# Patient Record
Sex: Male | Born: 1950 | Race: White | Hispanic: No | Smoking: Never smoker
Health system: Southern US, Community
[De-identification: ages and names within clinical notes are randomized; demographics above are authoritative.]

## PROBLEM LIST (undated history)

## (undated) DIAGNOSIS — S73004A Unspecified dislocation of right hip, initial encounter: Secondary | ICD-10-CM

## (undated) DIAGNOSIS — I499 Cardiac arrhythmia, unspecified: Secondary | ICD-10-CM

## (undated) DIAGNOSIS — J45909 Unspecified asthma, uncomplicated: Secondary | ICD-10-CM

## (undated) DIAGNOSIS — G40909 Epilepsy, unspecified, not intractable, without status epilepticus: Secondary | ICD-10-CM

## (undated) DIAGNOSIS — R609 Edema, unspecified: Secondary | ICD-10-CM

## (undated) DIAGNOSIS — I2510 Atherosclerotic heart disease of native coronary artery without angina pectoris: Secondary | ICD-10-CM

## (undated) DIAGNOSIS — M109 Gout, unspecified: Secondary | ICD-10-CM

## (undated) DIAGNOSIS — M1990 Unspecified osteoarthritis, unspecified site: Secondary | ICD-10-CM

## (undated) DIAGNOSIS — M810 Age-related osteoporosis without current pathological fracture: Secondary | ICD-10-CM

## (undated) DIAGNOSIS — F319 Bipolar disorder, unspecified: Secondary | ICD-10-CM

## (undated) DIAGNOSIS — N189 Chronic kidney disease, unspecified: Secondary | ICD-10-CM

## (undated) DIAGNOSIS — F1011 Alcohol abuse, in remission: Secondary | ICD-10-CM

## (undated) DIAGNOSIS — I5189 Other ill-defined heart diseases: Secondary | ICD-10-CM

## (undated) DIAGNOSIS — J449 Chronic obstructive pulmonary disease, unspecified: Secondary | ICD-10-CM

## (undated) DIAGNOSIS — G709 Myoneural disorder, unspecified: Secondary | ICD-10-CM

## (undated) DIAGNOSIS — F419 Anxiety disorder, unspecified: Secondary | ICD-10-CM

## (undated) DIAGNOSIS — S73005A Unspecified dislocation of left hip, initial encounter: Secondary | ICD-10-CM

## (undated) DIAGNOSIS — I480 Paroxysmal atrial fibrillation: Secondary | ICD-10-CM

## (undated) DIAGNOSIS — R931 Abnormal findings on diagnostic imaging of heart and coronary circulation: Secondary | ICD-10-CM

## (undated) DIAGNOSIS — I5020 Unspecified systolic (congestive) heart failure: Secondary | ICD-10-CM

## (undated) DIAGNOSIS — N184 Chronic kidney disease, stage 4 (severe): Secondary | ICD-10-CM

## (undated) DIAGNOSIS — F32A Depression, unspecified: Secondary | ICD-10-CM

## (undated) DIAGNOSIS — I428 Other cardiomyopathies: Secondary | ICD-10-CM

## (undated) DIAGNOSIS — I5022 Chronic systolic (congestive) heart failure: Secondary | ICD-10-CM

## (undated) DIAGNOSIS — Z862 Personal history of diseases of the blood and blood-forming organs and certain disorders involving the immune mechanism: Secondary | ICD-10-CM

## (undated) DIAGNOSIS — Z87442 Personal history of urinary calculi: Secondary | ICD-10-CM

## (undated) DIAGNOSIS — I509 Heart failure, unspecified: Secondary | ICD-10-CM

## (undated) DIAGNOSIS — I69359 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: Secondary | ICD-10-CM

## (undated) DIAGNOSIS — D649 Anemia, unspecified: Secondary | ICD-10-CM

## (undated) DIAGNOSIS — F1910 Other psychoactive substance abuse, uncomplicated: Secondary | ICD-10-CM

## (undated) DIAGNOSIS — I4891 Unspecified atrial fibrillation: Secondary | ICD-10-CM

## (undated) DIAGNOSIS — I340 Nonrheumatic mitral (valve) insufficiency: Secondary | ICD-10-CM

## (undated) DIAGNOSIS — G8929 Other chronic pain: Secondary | ICD-10-CM

## (undated) DIAGNOSIS — I10 Essential (primary) hypertension: Secondary | ICD-10-CM

## (undated) DIAGNOSIS — E039 Hypothyroidism, unspecified: Secondary | ICD-10-CM

## (undated) DIAGNOSIS — G4730 Sleep apnea, unspecified: Secondary | ICD-10-CM

## (undated) HISTORY — DX: Anemia, unspecified: D64.9

## (undated) HISTORY — DX: Age-related osteoporosis without current pathological fracture: M81.0

## (undated) HISTORY — PX: TOTAL HIP ARTHROPLASTY: SHX124

## (undated) HISTORY — DX: Other cardiomyopathies: I42.8

## (undated) HISTORY — DX: Cardiac arrhythmia, unspecified: I49.9

## (undated) HISTORY — DX: Sleep apnea, unspecified: G47.30

## (undated) HISTORY — DX: Chronic obstructive pulmonary disease, unspecified: J44.9

## (undated) HISTORY — PX: CHOLECYSTECTOMY: SHX55

## (undated) HISTORY — DX: Gout, unspecified: M10.9

## (undated) HISTORY — DX: Unspecified systolic (congestive) heart failure: I50.20

## (undated) HISTORY — PX: COLOSTOMY: SHX63

## (undated) HISTORY — DX: Chronic kidney disease, unspecified: N18.9

## (undated) HISTORY — PX: COLOSTOMY TAKEDOWN: SHX5783

## (undated) HISTORY — DX: Anxiety disorder, unspecified: F41.9

## (undated) HISTORY — DX: Depression, unspecified: F32.A

## (undated) HISTORY — DX: Heart failure, unspecified: I50.9

## (undated) HISTORY — PX: HERNIA REPAIR: SHX51

## (undated) HISTORY — DX: Paroxysmal atrial fibrillation: I48.0

## (undated) HISTORY — PX: ABDOMINAL SURGERY: SHX537

---

## 1972-06-14 HISTORY — PX: OTHER SURGICAL HISTORY: SHX169

## 2003-11-07 ENCOUNTER — Other Ambulatory Visit: Payer: Self-pay

## 2005-06-08 ENCOUNTER — Ambulatory Visit: Payer: Self-pay | Admitting: Specialist

## 2006-04-08 ENCOUNTER — Emergency Department: Payer: Self-pay | Admitting: Emergency Medicine

## 2007-12-23 ENCOUNTER — Emergency Department: Payer: Self-pay | Admitting: Emergency Medicine

## 2007-12-23 ENCOUNTER — Other Ambulatory Visit: Payer: Self-pay

## 2008-01-09 ENCOUNTER — Ambulatory Visit: Payer: Self-pay | Admitting: Psychiatry

## 2008-01-09 ENCOUNTER — Emergency Department: Payer: Self-pay | Admitting: Emergency Medicine

## 2008-01-10 ENCOUNTER — Inpatient Hospital Stay (HOSPITAL_COMMUNITY): Admission: AD | Admit: 2008-01-10 | Discharge: 2008-01-13 | Payer: Self-pay | Admitting: Psychiatry

## 2008-01-23 ENCOUNTER — Ambulatory Visit: Payer: Self-pay | Admitting: Specialist

## 2008-03-08 ENCOUNTER — Emergency Department: Payer: Self-pay | Admitting: Internal Medicine

## 2008-10-29 ENCOUNTER — Ambulatory Visit: Payer: Self-pay | Admitting: Internal Medicine

## 2009-05-23 ENCOUNTER — Ambulatory Visit: Payer: Self-pay | Admitting: Orthopedic Surgery

## 2009-10-16 ENCOUNTER — Emergency Department: Payer: Self-pay | Admitting: Emergency Medicine
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
## 2011-02-18 ENCOUNTER — Observation Stay: Payer: Self-pay | Admitting: Specialist

## 2011-05-10 LAB — CARDIAC PANEL(CRET KIN+CKTOT+MB+TROPI)
CK, MB: 1.6
Relative Index: INVALID
Total CK: 85
Troponin I: 0.03

## 2011-06-22 ENCOUNTER — Ambulatory Visit: Payer: Self-pay | Admitting: Internal Medicine

## 2011-07-27 ENCOUNTER — Emergency Department: Payer: Self-pay | Admitting: *Deleted

## 2011-09-25 ENCOUNTER — Observation Stay: Payer: Self-pay | Admitting: General Practice

## 2011-09-25 LAB — CBC
HCT: 42 % (ref 40.0–52.0)
HGB: 14.1 g/dL (ref 13.0–18.0)
MCH: 33.4 pg (ref 26.0–34.0)
MCV: 100 fL (ref 80–100)
RBC: 4.2 10*6/uL — ABNORMAL LOW (ref 4.40–5.90)
WBC: 6.4 10*3/uL (ref 3.8–10.6)

## 2011-09-25 LAB — BASIC METABOLIC PANEL
Anion Gap: 9 (ref 7–16)
BUN: 9 mg/dL (ref 7–18)
Calcium, Total: 9.2 mg/dL (ref 8.5–10.1)
Chloride: 105 mmol/L (ref 98–107)
EGFR (Non-African Amer.): >60
Glucose: 95 mg/dL (ref 65–99)
Osmolality: 280 (ref 275–301)

## 2011-11-18 ENCOUNTER — Ambulatory Visit: Payer: Self-pay | Admitting: Orthopaedic Surgery

## 2011-11-18 ENCOUNTER — Emergency Department: Payer: Self-pay | Admitting: Emergency Medicine

## 2012-06-17 ENCOUNTER — Encounter: Payer: Self-pay | Admitting: Orthopedic Surgery

## 2012-07-14 ENCOUNTER — Encounter: Payer: Self-pay | Admitting: Orthopedic Surgery

## 2012-07-18 ENCOUNTER — Observation Stay: Payer: Self-pay | Admitting: Orthopedic Surgery

## 2012-07-18 LAB — BASIC METABOLIC PANEL
Calcium, Total: 8.7 mg/dL (ref 8.5–10.1)
Co2: 24 mmol/L (ref 21–32)
EGFR (Non-African Amer.): >60
Glucose: 106 mg/dL — ABNORMAL HIGH (ref 65–99)
Osmolality: 273 (ref 275–301)
Potassium: 5.3 mmol/L — ABNORMAL HIGH (ref 3.5–5.1)

## 2012-09-21 ENCOUNTER — Inpatient Hospital Stay: Payer: Self-pay | Admitting: Surgery

## 2012-09-21 LAB — CBC
HCT: 43.2 % (ref 40.0–52.0)
MCV: 96 fL (ref 80–100)
Platelet: 240 10*3/uL (ref 150–440)
RBC: 4.49 10*6/uL (ref 4.40–5.90)
RDW: 13.1 % (ref 11.5–14.5)

## 2012-09-21 LAB — COMPREHENSIVE METABOLIC PANEL
Albumin: 3.2 g/dL — ABNORMAL LOW (ref 3.4–5.0)
Alkaline Phosphatase: 144 U/L — ABNORMAL HIGH (ref 50–136)
Bilirubin,Total: 1.1 mg/dL — ABNORMAL HIGH (ref 0.2–1.0)
Calcium, Total: 9.5 mg/dL (ref 8.5–10.1)
Creatinine: 1.01 mg/dL (ref 0.60–1.30)
EGFR (African American): >60
Glucose: 169 mg/dL — ABNORMAL HIGH (ref 65–99)
Osmolality: 284 (ref 275–301)
Potassium: 3.7 mmol/L (ref 3.5–5.1)
SGOT(AST): 273 U/L — ABNORMAL HIGH (ref 15–37)
SGPT (ALT): 92 U/L — ABNORMAL HIGH (ref 12–78)
Sodium: 140 mmol/L (ref 136–145)

## 2012-09-21 LAB — APTT: Activated PTT: 31.2 secs (ref 23.6–35.9)

## 2012-09-21 LAB — PROTIME-INR: Prothrombin Time: 13.2 secs (ref 11.5–14.7)

## 2012-09-22 DIAGNOSIS — R079 Chest pain, unspecified: Secondary | ICD-10-CM

## 2012-09-22 LAB — TROPONIN I: Troponin-I: <0.02 ng/mL

## 2012-09-22 LAB — HEPATIC FUNCTION PANEL A (ARMC)
Albumin: 2.8 g/dL — ABNORMAL LOW (ref 3.4–5.0)
Alkaline Phosphatase: 151 U/L — ABNORMAL HIGH (ref 50–136)
Bilirubin,Total: 1 mg/dL (ref 0.2–1.0)
SGOT(AST): 340 U/L — ABNORMAL HIGH (ref 15–37)
Total Protein: 6.3 g/dL — ABNORMAL LOW (ref 6.4–8.2)

## 2012-09-22 LAB — CBC WITH DIFFERENTIAL/PLATELET
Eosinophil #: 0.2 10*3/uL (ref 0.0–0.7)
HGB: 13.2 g/dL (ref 13.0–18.0)
Lymphocyte #: 2.4 10*3/uL (ref 1.0–3.6)
MCH: 31.9 pg (ref 26.0–34.0)
MCHC: 32.9 g/dL (ref 32.0–36.0)
Monocyte %: 10.7 %
Neutrophil #: 1.9 10*3/uL (ref 1.4–6.5)
Neutrophil %: 37 %
Platelet: 210 10*3/uL (ref 150–440)
WBC: 5 10*3/uL (ref 3.8–10.6)

## 2012-09-22 LAB — BASIC METABOLIC PANEL
Calcium, Total: 8.9 mg/dL (ref 8.5–10.1)
Chloride: 106 mmol/L (ref 98–107)
Creatinine: 0.88 mg/dL (ref 0.60–1.30)
EGFR (Non-African Amer.): >60

## 2012-09-22 LAB — HEMOGLOBIN A1C: Hemoglobin A1C: 6 % (ref 4.2–6.3)

## 2012-09-22 LAB — LIPID PANEL
Cholesterol: 108 mg/dL (ref 0–200)
VLDL Cholesterol, Calc: 11 mg/dL (ref 5–40)

## 2012-09-23 DIAGNOSIS — R079 Chest pain, unspecified: Secondary | ICD-10-CM

## 2012-09-23 LAB — HEPATIC FUNCTION PANEL A (ARMC)
Albumin: 3 g/dL — ABNORMAL LOW (ref 3.4–5.0)
Alkaline Phosphatase: 202 U/L — ABNORMAL HIGH (ref 50–136)
Bilirubin, Direct: 0.2 mg/dL (ref 0.00–0.20)
SGPT (ALT): 383 U/L — ABNORMAL HIGH (ref 12–78)

## 2012-09-24 LAB — HEPATIC FUNCTION PANEL A (ARMC)
Albumin: 2.9 g/dL — ABNORMAL LOW (ref 3.4–5.0)
SGOT(AST): 128 U/L — ABNORMAL HIGH (ref 15–37)
Total Protein: 6.6 g/dL (ref 6.4–8.2)

## 2012-09-25 LAB — BASIC METABOLIC PANEL
Creatinine: 1.18 mg/dL (ref 0.60–1.30)
EGFR (Non-African Amer.): >60
Glucose: 143 mg/dL — ABNORMAL HIGH (ref 65–99)

## 2012-09-25 LAB — CBC WITH DIFFERENTIAL/PLATELET
Eosinophil #: 0 10*3/uL (ref 0.0–0.7)
Eosinophil %: 0.1 %
HCT: 34.7 % — ABNORMAL LOW (ref 40.0–52.0)
HGB: 11.8 g/dL — ABNORMAL LOW (ref 13.0–18.0)
Monocyte %: 9.1 %
Platelet: 327 10*3/uL (ref 150–440)
RBC: 3.61 10*6/uL — ABNORMAL LOW (ref 4.40–5.90)
WBC: 11 10*3/uL — ABNORMAL HIGH (ref 3.8–10.6)

## 2012-09-25 LAB — HEPATIC FUNCTION PANEL A (ARMC)
Albumin: 2.7 g/dL — ABNORMAL LOW (ref 3.4–5.0)
Bilirubin,Total: 1 mg/dL (ref 0.2–1.0)
SGPT (ALT): 158 U/L — ABNORMAL HIGH (ref 12–78)
Total Protein: 6.1 g/dL — ABNORMAL LOW (ref 6.4–8.2)

## 2012-09-25 LAB — HEMOGLOBIN: HGB: 10.7 g/dL — ABNORMAL LOW (ref 13.0–18.0)

## 2012-09-26 LAB — PROTIME-INR
INR: 1
Prothrombin Time: 14 secs (ref 11.5–14.7)

## 2012-09-26 LAB — APTT: Activated PTT: 29.8 secs (ref 23.6–35.9)

## 2012-09-26 LAB — HEMOGLOBIN
HGB: 8.3 g/dL — ABNORMAL LOW (ref 13.0–18.0)
HGB: 9.5 g/dL — ABNORMAL LOW (ref 13.0–18.0)

## 2012-10-02 ENCOUNTER — Other Ambulatory Visit: Payer: Self-pay | Admitting: Surgery

## 2012-10-02 LAB — CBC WITH DIFFERENTIAL/PLATELET
Basophil #: 0.1 x10 3/mm 3
Basophil %: 0.8 %
Eosinophil #: 0.6 x10 3/mm 3
Eosinophil %: 5.8 %
HCT: 29 % — ABNORMAL LOW
HGB: 9.8 g/dL — ABNORMAL LOW
Lymphocyte %: 26.2 %
Lymphs Abs: 2.5 x10 3/mm 3
MCH: 33.1 pg
MCHC: 33.6 g/dL
MCV: 99 fL
Monocyte #: 1.2 "x10 3/mm " — ABNORMAL HIGH
Monocyte %: 12.2 %
Neutrophil #: 5.3 x10 3/mm 3
Neutrophil %: 55 %
Platelet: 451 x10 3/mm 3 — ABNORMAL HIGH
RBC: 2.95 x10 6/mm 3 — ABNORMAL LOW
RDW: 14 %
WBC: 9.6 x10 3/mm 3

## 2012-12-23 ENCOUNTER — Emergency Department: Payer: Self-pay | Admitting: Emergency Medicine

## 2013-05-19 ENCOUNTER — Emergency Department: Payer: Self-pay | Admitting: Emergency Medicine

## 2013-05-19 ENCOUNTER — Ambulatory Visit: Payer: Self-pay | Admitting: Orthopedic Surgery

## 2013-05-19 LAB — COMPREHENSIVE METABOLIC PANEL
Albumin: 2.8 g/dL — ABNORMAL LOW (ref 3.4–5.0)
Anion Gap: 3 — ABNORMAL LOW (ref 7–16)
BUN: 26 mg/dL — ABNORMAL HIGH (ref 7–18)
Bilirubin,Total: 0.6 mg/dL (ref 0.2–1.0)
Calcium, Total: 8.7 mg/dL (ref 8.5–10.1)
Co2: 29 mmol/L (ref 21–32)
Creatinine: 1.3 mg/dL (ref 0.60–1.30)
EGFR (African American): >60
Glucose: 116 mg/dL — ABNORMAL HIGH (ref 65–99)
SGOT(AST): 21 U/L (ref 15–37)
SGPT (ALT): 14 U/L (ref 12–78)
Total Protein: 5.8 g/dL — ABNORMAL LOW (ref 6.4–8.2)

## 2013-05-19 LAB — CBC
HCT: 34.4 % — ABNORMAL LOW (ref 40.0–52.0)
HGB: 11.9 g/dL — ABNORMAL LOW (ref 13.0–18.0)
MCHC: 34.6 g/dL (ref 32.0–36.0)
RDW: 13.9 % (ref 11.5–14.5)
WBC: 6.4 10*3/uL (ref 3.8–10.6)

## 2013-05-19 LAB — PROTIME-INR: Prothrombin Time: 12.9 secs (ref 11.5–14.7)

## 2013-07-08 ENCOUNTER — Other Ambulatory Visit: Payer: Self-pay | Admitting: Physician Assistant

## 2013-07-08 LAB — CBC WITH DIFFERENTIAL/PLATELET
Eosinophil #: 0.1 10*3/uL (ref 0.0–0.7)
Eosinophil %: 2 %
HGB: 14.9 g/dL (ref 13.0–18.0)
Lymphocyte #: 2.6 10*3/uL (ref 1.0–3.6)
MCHC: 33.5 g/dL (ref 32.0–36.0)
MCV: 98 fL (ref 80–100)
Monocyte #: 0.5 x10 3/mm (ref 0.2–1.0)
WBC: 6.9 10*3/uL (ref 3.8–10.6)

## 2013-09-15 ENCOUNTER — Ambulatory Visit: Payer: Self-pay | Admitting: Student

## 2013-09-15 ENCOUNTER — Ambulatory Visit: Payer: Self-pay | Admitting: Internal Medicine

## 2013-09-15 LAB — BASIC METABOLIC PANEL
Anion Gap: 3 — ABNORMAL LOW (ref 7–16)
BUN: 17 mg/dL (ref 7–18)
CALCIUM: 9.5 mg/dL (ref 8.5–10.1)
CHLORIDE: 105 mmol/L (ref 98–107)
CO2: 27 mmol/L (ref 21–32)
Creatinine: 1.19 mg/dL (ref 0.60–1.30)
EGFR (African American): >60
EGFR (Non-African Amer.): >60
GLUCOSE: 108 mg/dL — AB (ref 65–99)
OSMOLALITY: 272 (ref 275–301)
POTASSIUM: 4.7 mmol/L (ref 3.5–5.1)
Sodium: 135 mmol/L — ABNORMAL LOW (ref 136–145)

## 2013-09-15 LAB — CBC
HCT: 40.7 % (ref 40.0–52.0)
HGB: 13.7 g/dL (ref 13.0–18.0)
MCH: 32.8 pg (ref 26.0–34.0)
MCHC: 33.6 g/dL (ref 32.0–36.0)
MCV: 98 fL (ref 80–100)
PLATELETS: 209 10*3/uL (ref 150–440)
RBC: 4.17 10*6/uL — ABNORMAL LOW (ref 4.40–5.90)
RDW: 14 % (ref 11.5–14.5)
WBC: 6.5 10*3/uL (ref 3.8–10.6)

## 2013-10-29 ENCOUNTER — Ambulatory Visit: Payer: Self-pay | Admitting: Orthopedic Surgery

## 2013-12-01 ENCOUNTER — Emergency Department: Payer: Self-pay | Admitting: Emergency Medicine

## 2014-03-07 ENCOUNTER — Ambulatory Visit: Payer: Self-pay | Admitting: Orthopedic Surgery

## 2014-03-07 ENCOUNTER — Emergency Department: Payer: Self-pay | Admitting: Emergency Medicine

## 2014-03-07 LAB — CBC
HCT: 39.5 % — ABNORMAL LOW (ref 40.0–52.0)
HGB: 13 g/dL (ref 13.0–18.0)
MCH: 33.2 pg (ref 26.0–34.0)
MCHC: 32.9 g/dL (ref 32.0–36.0)
MCV: 101 fL — AB (ref 80–100)
PLATELETS: 214 10*3/uL (ref 150–440)
RBC: 3.92 10*6/uL — ABNORMAL LOW (ref 4.40–5.90)
RDW: 14 % (ref 11.5–14.5)
WBC: 7.2 10*3/uL (ref 3.8–10.6)

## 2014-03-07 LAB — BASIC METABOLIC PANEL
Anion Gap: 6 — ABNORMAL LOW (ref 7–16)
BUN: 16 mg/dL (ref 7–18)
CO2: 26 mmol/L (ref 21–32)
Calcium, Total: 9.4 mg/dL (ref 8.5–10.1)
Chloride: 106 mmol/L (ref 98–107)
Creatinine: 1.15 mg/dL (ref 0.60–1.30)
EGFR (Non-African Amer.): >60
Glucose: 99 mg/dL (ref 65–99)
OSMOLALITY: 277 (ref 275–301)
Potassium: 4 mmol/L (ref 3.5–5.1)
SODIUM: 138 mmol/L (ref 136–145)

## 2014-05-06 ENCOUNTER — Emergency Department: Payer: Self-pay | Admitting: Emergency Medicine

## 2014-06-25 ENCOUNTER — Observation Stay: Payer: Self-pay | Admitting: Orthopedic Surgery

## 2014-06-26 LAB — CBC WITH DIFFERENTIAL/PLATELET
BASOS PCT: 0.4 %
Basophil #: 0 10*3/uL (ref 0.0–0.1)
Eosinophil #: 0.2 10*3/uL (ref 0.0–0.7)
Eosinophil %: 2.7 %
HCT: 37.2 % — AB (ref 40.0–52.0)
HGB: 12.4 g/dL — AB (ref 13.0–18.0)
LYMPHS ABS: 2.1 10*3/uL (ref 1.0–3.6)
LYMPHS PCT: 33.9 %
MCH: 34.1 pg — AB (ref 26.0–34.0)
MCHC: 33.3 g/dL (ref 32.0–36.0)
MCV: 102 fL — AB (ref 80–100)
MONO ABS: 0.7 x10 3/mm (ref 0.2–1.0)
MONOS PCT: 11.7 %
NEUTROS PCT: 51.3 %
Neutrophil #: 3.1 10*3/uL (ref 1.4–6.5)
Platelet: 190 10*3/uL (ref 150–440)
RBC: 3.64 10*6/uL — AB (ref 4.40–5.90)
RDW: 13.6 % (ref 11.5–14.5)
WBC: 6.1 10*3/uL (ref 3.8–10.6)

## 2014-06-26 LAB — BASIC METABOLIC PANEL
Anion Gap: 3 — ABNORMAL LOW (ref 7–16)
Anion Gap: 5 — ABNORMAL LOW (ref 7–16)
BUN: 11 mg/dL (ref 7–18)
BUN: 13 mg/dL (ref 7–18)
CHLORIDE: 107 mmol/L (ref 98–107)
CO2: 29 mmol/L (ref 21–32)
CREATININE: 1.06 mg/dL (ref 0.60–1.30)
Calcium, Total: 8.8 mg/dL (ref 8.5–10.1)
Calcium, Total: 8.8 mg/dL (ref 8.5–10.1)
Chloride: 106 mmol/L (ref 98–107)
Co2: 31 mmol/L (ref 21–32)
Creatinine: 1.2 mg/dL (ref 0.60–1.30)
EGFR (African American): >60
EGFR (African American): >60
EGFR (Non-African Amer.): >60
EGFR (Non-African Amer.): >60
GLUCOSE: 109 mg/dL — AB (ref 65–99)
Glucose: 100 mg/dL — ABNORMAL HIGH (ref 65–99)
OSMOLALITY: 278 (ref 275–301)
Osmolality: 283 (ref 275–301)
Potassium: 3.5 mmol/L (ref 3.5–5.1)
Potassium: 3.7 mmol/L (ref 3.5–5.1)
Sodium: 139 mmol/L (ref 136–145)
Sodium: 142 mmol/L (ref 136–145)

## 2014-07-28 ENCOUNTER — Emergency Department: Payer: Self-pay | Admitting: Specialist

## 2014-07-28 LAB — BASIC METABOLIC PANEL
Anion Gap: 4 — ABNORMAL LOW (ref 7–16)
BUN: 19 mg/dL — ABNORMAL HIGH (ref 7–18)
CO2: 26 mmol/L (ref 21–32)
CREATININE: 1.1 mg/dL (ref 0.60–1.30)
Calcium, Total: 9.2 mg/dL (ref 8.5–10.1)
Chloride: 105 mmol/L (ref 98–107)
EGFR (African American): >60
EGFR (Non-African Amer.): >60
Glucose: 92 mg/dL (ref 65–99)
OSMOLALITY: 272 (ref 275–301)
Potassium: 4.5 mmol/L (ref 3.5–5.1)
Sodium: 135 mmol/L — ABNORMAL LOW (ref 136–145)

## 2014-07-28 LAB — CBC WITH DIFFERENTIAL/PLATELET
BASOS ABS: 0.1 10*3/uL (ref 0.0–0.1)
Basophil %: 0.9 %
EOS ABS: 0.2 10*3/uL (ref 0.0–0.7)
EOS PCT: 3.2 %
HCT: 39 % — AB (ref 40.0–52.0)
HGB: 12.8 g/dL — ABNORMAL LOW (ref 13.0–18.0)
LYMPHS PCT: 35.2 %
Lymphocyte #: 1.9 10*3/uL (ref 1.0–3.6)
MCH: 32.6 pg (ref 26.0–34.0)
MCHC: 32.7 g/dL (ref 32.0–36.0)
MCV: 100 fL (ref 80–100)
Monocyte #: 0.5 x10 3/mm (ref 0.2–1.0)
Monocyte %: 8.9 %
NEUTROS PCT: 51.8 %
Neutrophil #: 2.8 10*3/uL (ref 1.4–6.5)
Platelet: 214 10*3/uL (ref 150–440)
RBC: 3.92 10*6/uL — AB (ref 4.40–5.90)
RDW: 13.5 % (ref 11.5–14.5)
WBC: 5.4 10*3/uL (ref 3.8–10.6)

## 2014-08-28 ENCOUNTER — Ambulatory Visit: Payer: Self-pay | Admitting: Orthopedic Surgery

## 2014-08-28 LAB — CBC WITH DIFFERENTIAL/PLATELET
BASOS PCT: 1.1 %
Basophil #: 0.1 10*3/uL (ref 0.0–0.1)
EOS ABS: 0.2 10*3/uL (ref 0.0–0.7)
EOS PCT: 3 %
HCT: 38.6 % — AB (ref 40.0–52.0)
HGB: 12.7 g/dL — ABNORMAL LOW (ref 13.0–18.0)
Lymphocyte #: 2.4 10*3/uL (ref 1.0–3.6)
Lymphocyte %: 34.8 %
MCH: 32.9 pg (ref 26.0–34.0)
MCHC: 33 g/dL (ref 32.0–36.0)
MCV: 100 fL (ref 80–100)
Monocyte #: 0.6 x10 3/mm (ref 0.2–1.0)
Monocyte %: 8.8 %
NEUTROS ABS: 3.6 10*3/uL (ref 1.4–6.5)
NEUTROS PCT: 52.3 %
PLATELETS: 274 10*3/uL (ref 150–440)
RBC: 3.88 10*6/uL — AB (ref 4.40–5.90)
RDW: 14.5 % (ref 11.5–14.5)
WBC: 6.8 10*3/uL (ref 3.8–10.6)

## 2014-08-28 LAB — BASIC METABOLIC PANEL
Anion Gap: 6 — ABNORMAL LOW (ref 7–16)
BUN: 15 mg/dL (ref 7–18)
CO2: 27 mmol/L (ref 21–32)
CREATININE: 1.21 mg/dL (ref 0.60–1.30)
Calcium, Total: 8.9 mg/dL (ref 8.5–10.1)
Chloride: 106 mmol/L (ref 98–107)
EGFR (African American): >60
Glucose: 84 mg/dL (ref 65–99)
OSMOLALITY: 278 (ref 275–301)
Potassium: 4.2 mmol/L (ref 3.5–5.1)
SODIUM: 139 mmol/L (ref 136–145)

## 2014-08-28 LAB — PROTIME-INR
INR: 0.9
Prothrombin Time: 12.5 secs (ref 11.5–14.7)

## 2014-08-28 LAB — APTT: Activated PTT: 35.2 secs (ref 23.6–35.9)

## 2014-09-18 ENCOUNTER — Observation Stay: Payer: Self-pay | Admitting: Orthopedic Surgery

## 2014-09-18 LAB — BASIC METABOLIC PANEL
ANION GAP: 7 (ref 7–16)
BUN: 16 mg/dL (ref 7–18)
CHLORIDE: 107 mmol/L (ref 98–107)
CREATININE: 1.14 mg/dL (ref 0.60–1.30)
Calcium, Total: 9.5 mg/dL (ref 8.5–10.1)
Co2: 25 mmol/L (ref 21–32)
Glucose: 93 mg/dL (ref 65–99)
Osmolality: 278 (ref 275–301)
Potassium: 4.3 mmol/L (ref 3.5–5.1)
Sodium: 139 mmol/L (ref 136–145)

## 2014-09-18 LAB — CBC WITH DIFFERENTIAL/PLATELET
BASOS ABS: 0.1 10*3/uL (ref 0.0–0.1)
Basophil %: 0.8 %
EOS PCT: 3.3 %
Eosinophil #: 0.2 10*3/uL (ref 0.0–0.7)
HCT: 42.2 % (ref 40.0–52.0)
HGB: 13.9 g/dL (ref 13.0–18.0)
LYMPHS ABS: 2.4 10*3/uL (ref 1.0–3.6)
Lymphocyte %: 38.6 %
MCH: 32.7 pg (ref 26.0–34.0)
MCHC: 32.9 g/dL (ref 32.0–36.0)
MCV: 99 fL (ref 80–100)
MONO ABS: 0.6 x10 3/mm (ref 0.2–1.0)
MONOS PCT: 9.3 %
Neutrophil #: 3 10*3/uL (ref 1.4–6.5)
Neutrophil %: 48 %
Platelet: 214 10*3/uL (ref 150–440)
RBC: 4.25 10*6/uL — AB (ref 4.40–5.90)
RDW: 14.3 % (ref 11.5–14.5)
WBC: 6.2 10*3/uL (ref 3.8–10.6)

## 2014-09-18 LAB — PROTIME-INR
INR: 1
PROTHROMBIN TIME: 13.2 s

## 2014-09-18 LAB — APTT: ACTIVATED PTT: 38.3 s — AB (ref 23.6–35.9)

## 2014-09-21 ENCOUNTER — Inpatient Hospital Stay: Payer: Self-pay | Admitting: Orthopedic Surgery
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
## 2014-12-01 NOTE — Op Note (Signed)
PATIENT NAME:  Matthew [name], REBELO MR#:  [id number] DATE OF BIRTH:  04-Apr-1951  DATE OF PROCEDURE:  07/18/2012  PREOPERATIVE DIAGNOSIS: Left dislocated total hip.   POSTOPERATIVE DIAGNOSIS: Left dislocated total hip.   PROCEDURE: Closed reduction of left total hip.   SURGEON: Hessie Knows, MD  ANESTHESIA: General.   HISTORY OF PRESENT ILLNESS: The patient is a 64 year old who has had multiple prior dislocations of his total hip requiring anesthesia to get it reduced. He had closed reduction attempted in the Emergency Room which was unsuccessful. He was brought to the Operating Room for closed reduction today.   DESCRIPTION OF PROCEDURE: After appropriate patient identification and time out procedures were completed, the C-arm was brought in and the head visualized. In extreme flexion, the hip came down from a superior aspect. With longitudinal traction and external rotation, it appeared to be centered. With gentle internal rotation and extension, it reduced. The leg appeared to have a concentric reduction. With the leg in extension, with traction, there was some pistoning of the hip apparent. There was no blood loss and no complications and no specimen. The patient was sent to the Recovery Room in stable condition with a knee immobilizer to prevent knee flexion and hopefully recurrent dislocation.  ____________________________ Laurene Footman, MD mjm:slb D: 07/18/2012 21:34:25 ET T: 07/19/2012 07:56:07 ET JOB#: [id number]  cc: Laurene Footman, MD, <Dictator> Dr. Gerrit Heck ([place]) [facility] MD ELECTRONICALLY SIGNED 07/19/2012 8:26
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
## 2014-12-04 NOTE — Op Note (Signed)
PATIENT NAME:  Matthew [name], Matthew [name] MR#:  [id number] DATE OF BIRTH:  [birth year]  DATE OF PROCEDURE:  09/24/2012  PREOPERATIVE DIAGNOSIS:  Acute cholecystitis.   POSTOPERATIVE DIAGNOSIS:  Acute cholecystitis.  PROCEDURE:  Laparoscopic cholecystectomy.   SURGEON:  Rodena Goldmann, M.D.   ASSISTANT:  [place], [place] student.   ANESTHESIA:  General.   OPERATIVE PROCEDURE:  With the patient in the supine position, after the induction of appropriate general anesthesia, the patient's abdomen was prepped with ChloraPrep and draped with sterile towels. Because of his previous multiple surgeries, we attempted a right lower quadrant entrance by using the Visiport apparatus under direct vision.  The abdominal cavity was cannulated without difficulty, and the abdomen insufflated. The adhesions were identified. The right upper quadrant midline superiorly were free of major adhesions.  Ports were placed in the standard fashion with the exception of the fact that the umbilical port was placed to the right of the umbilicus and several centimeters above. All ports were placed under direct vision, and no bowel injury was identified. The gallbladder was retracted superiorly and laterally exposing the hepatoduodenal ligament. The cystic artery and cystic duct were identified. The cystic duct was doubly clipped on both sides and divided. The cystic artery was doubly clipped and divided. The gallbladder was then dissected free from its bed and delivered using hook and cautery apparatus. Once the gallbladder was free, it was captured and removed through the midepigastric incision. The incision had to be enlarged to accommodate the very large stone. The area was then copiously suctioned and irrigated. JP drain was placed because of the multiple adhesions and inflammatory change around the gallbladder. It was brought out through one of the separate stab wounds. The upper midline incision was closed with 0 Vicryl in 2 figure-of-eight sutures  using the suture passer. The area was infiltrated with 0.25% Marcaine for postoperative pain control.   Sterile dressings were applied. The patient was returned to the recovery room having tolerated the procedure well. Sponge, instrument and needle counts were correct x 2 in the operating room.     ____________________________ Rodena Goldmann III, MD rle:dm D: 09/24/2012 16:28:50 ET T: 09/24/2012 22:35:48 ET JOB#: [id number]  cc: Rodena Goldmann III, MD, <Dictator> Rodena Goldmann MD ELECTRONICALLY SIGNED 09/25/2012 18:06

## 2014-12-04 NOTE — Consult Note (Signed)
General Aspect 64 yo patient admitted with prolonged chest pain   Present Illness No documented cardiac history.  Indicates normal stress test 2 years ago.  Had just gone to get a Pepsi and was in bed  watching Olympics had 10/10 SSCP  Hard to breath not positional.  Lasted 45 minutes  then returned latter at which time he went to ER.  History of ETOH abuse and LFTls elevated.  Denies history of esophageal diseae or ulcers.  Has had multiple hip surgeries and needs another one 10/03/12 Currently pain free   Physical Exam:  GEN Desheveled talkative male   NECK supple   RESP normal resp effort  clear BS   CARD Regular rate and rhythm  No murmur   ABD denies tenderness  soft   EXTR negative edema   SKIN No rashes   NEURO Nonfocal   Review of Systems:  Subjective/Chief Complaint Chest pain   Medications/Allergies Reviewed Medications/Allergies reviewed   Home Medications: Medication Instructions Status  Cymbalta 1 cap(s) orally 2 times a day Active  lisinopril 10 mg oral tablet 1 tab(s) orally once a day Active  fluticasone 50 mcg/inh nasal spray 2 spray(s) nasal 2 times a day Active  Serevent Diskus 50 mcg inhalation powder 1 puff(s) inhaled 2 times a day Active  diazepam 5 mg oral tablet 1 tab(s) orally once a day (at bedtime) Active  cyclobenzaprine 5 mg oral tablet 1 tab(s) orally 1 hour before bedtime Active   Lab Results: Routine Chem:  09-Feb-14 02:27   Hemoglobin A1c ([facility]) 6.0 (The American Diabetes Association recommends that a primary goal of therapy should be <7% and that physicians should reevaluate the treatment regimen in patients with HbA1c values consistently >8%.)  Cholesterol, Serum 108  Triglycerides, Serum 54  HDL (INHOUSE) 43  VLDL Cholesterol Calculated 11  LDL Cholesterol Calculated 54 (Result(s) reported on 22 Sep 2012 at 03:03AM.)  Glucose, Serum  124  BUN 14  Creatinine (comp) 0.88  Sodium, Serum 139  Potassium, Serum 3.9  Chloride, Serum  106  CO2, Serum 27  Calcium (Total), Serum 8.9  Anion Gap  6  Osmolality (calc) 279  eGFR (African American) >60  eGFR (Non-African American) >60 (eGFR values <73m/min/1.73 m2 may be an indication of chronic kidney disease (CKD). Calculated eGFR is useful in patients with stable renal function. The eGFR calculation will not be reliable in acutely ill patients when serum creatinine is changing rapidly. It is not useful in  patients on dialysis. The eGFR calculation may not be applicable to patients at the low and high extremes of body sizes, pregnant women, and vegetarians.)  Cardiac:  09-Feb-14 02:27   Troponin I < 0.02 (0.00-0.05 0.05 ng/mL or less: NEGATIVE  Repeat testing in 3-6 hrs  if clinically indicated. >0.05 ng/mL: POTENTIAL  MYOCARDIAL INJURY. Repeat  testing in 3-6 hrs if  clinically indicated. NOTE: An increase or decrease  of 30% or more on serial  testing suggests a  clinically important change)  CPK-MB, Serum  < 0.5 (Result(s) reported on 22 Sep 2012 at 03:03AM.)  Routine Hem:  09-Feb-14 02:27   WBC (CBC) 5.0  RBC (CBC)  4.14  Hemoglobin (CBC) 13.2  Hematocrit (CBC) 40.2  Platelet Count (CBC) 210  MCV 97  MCH 31.9  MCHC 32.9  RDW 13.3  Neutrophil % 37.0  Lymphocyte % 48.1  Monocyte % 10.7  Eosinophil % 3.7  Basophil % 0.5  Neutrophil # 1.9  Lymphocyte # 2.4  Monocyte #  0.5  Eosinophil # 0.2  Basophil # 0.0 (Result(s) reported on 22 Sep 2012 at 02:50AM.)   EKG:  Interpretation NSR no acute ischemic changes   Radiology Results: CT:    08-Feb-14 20:05, CT Chest for Pulm Embolism With Contrast  CT Chest for Pulm Embolism With Contrast   REASON FOR EXAM:    chest pain  COMMENTS:       PROCEDURE: CT  - CT CHEST (FOR PE) W  - Sep 21 2012  8:05PM     RESULT: Indications: Chest Pain    Comparison: None    Technique: A thin-section spiral CT from the lung apices to the upper   abdomen wasacquired on a multi slice scanner following 131m  Isovue-370   intravenous contrast. These images were then transferred to the Siemens   work station and were subsequently reviewed utilizing 3-D reconstructions   and MIP images.  Findings:     There is adequate opacification of the pulmonary arteries. There is no   pulmonary embolus. The main pulmonary artery, right main pulmonary   artery, and left main pulmonary arteries are normal in size. The heart   size is normal. There is no pericardial effusion. There is coronary   artery atherosclerosis in the LAD.    There is minimal lingular airspace disease which may reflect atelectasis   versus developing interstitial pneumonitis. There is no focal   consolidation, pleural effusion, or pneumothorax.     There is no axillary, hilar, or mediastinal adenopathy.    The osseous structures are unremarkable.  The visualized portions of the upper abdomen are unremarkable.    IMPRESSION:      1. No CT evidence of pulmonary embolus.    2. There is minimal lingular airspace disease which may reflect   atelectasis versus developing interstitial pneumonitis.    Dictation Site: 1        Verified By: HJennette Banker M.D., MD    Talwin: GI Distress  Demerol: Other  Vital Signs/Nurse's Notes: **Vital Signs.:   09-Feb-14 07:47  Vital Signs Type Routine  Temperature Temperature (F) 97.4  Temperature Source oral  Pulse Pulse 51  Systolic BP Systolic BP 1130 Diastolic BP (mmHg) Diastolic BP (mmHg) 86  Mean BP 110  Pulse Ox % Pulse Ox % 100  Oxygen Delivery Room Air/ 21 %    Impression Chest pain with no ECG changes or elevated enzymes.  HTN.  Needs hip surgery 2/20  Needs to be in hospital for further w/u of elevated LFTls ? from ETOH vs GB disease.  Cannot walk on treadmill due to knee and hip problems.  GBUS pending   Plan Lexiscan myovue in am since he will be in hospital to w/u liver issues   Electronic Signatures: NJosue Hector(MD)  (Signed 09-Feb-14 10:33)  Authored: General  Aspect/Present Illness, History and Physical Exam, Review of System, Home Medications, Labs, EKG , Radiology, Allergies, Vital Signs/Nurse's Notes, Impression/Plan   Last Updated: 09-Feb-14 10:33 by NJosue Hector(MD)

## 2014-12-04 NOTE — Op Note (Signed)
PATIENT NAME:  Matthew [name], ERHARD MR#:  [id number] DATE OF BIRTH:  [birth year]  DATE OF PROCEDURE:  12/23/2012  PREOPERATIVE DIAGNOSIS: Dislocated left total hip.   POSTOPERATIVE DIAGNOSIS: Dislocated left total hip.   PROCEDURE: Closed reduction left total hip.   ANESTHESIA: General.   SURGEON: Laurene Footman, MD    DESCRIPTION OF PROCEDURE: The patient was brought to the operating room and after adequate anesthesia was obtained, appropriate patient identification and timeout procedures were completed. After this was done, C-arm was brought in and under fluoroscopic guidance, attempt at hip reduction carried out. Initially with flexion and traction, it appeared to reduce but slid out superiorly. With slight rotation internal and external, successful reduction was obtained with a palpable and audible pop the hip reducing. Permanent C-arm view of this was obtained. The hip was stable through a gentle range of motion. The patient was then sent to the recovery room in stable condition.   ESTIMATED BLOOD LOSS: None.   COMPLICATIONS: None.   SPECIMEN: None.    ____________________________ Laurene Footman, MD mjm:es D: 12/23/2012 19:44:01 ET T: 12/24/2012 08:16:30 ET JOB#: [id number]  cc: Laurene Footman, MD, <Dictator> Laurene Footman MD ELECTRONICALLY SIGNED 12/24/2012 9:06

## 2014-12-04 NOTE — Discharge Summary (Signed)
PATIENT NAME:  Matthew [name], Matthew [name] MR#:  [id number] DATE OF BIRTH:  [birth year]  DATE OF ADMISSION:  09/21/2012 DATE OF DISCHARGE:  09/26/2012  BRIEF HISTORY: The patient is a 64 year old gentleman admitted through the Emergency Room with abdominal pain suggestive of biliary tract disease. He was admitted to the  primary care service with what appeared to be chest and right upper quadrant pain. Cardiology consultation was undertaken, did not appear to be any evidence of coronary disease. He does have a history of previous normal stress test 2 years prior to this evaluation. Cardiac workup was largely unremarkable. He was noted to have some biliary tract disease, slightly elevated liver function studies and evidence for acute gallbladder inflammation. The surgical service was consulted. He was taken to surgery on the afternoon of 09/24/2012, where he underwent  laparoscopic cholecystectomy. The surgery was very difficult because of his multiple previous abdominal surgeries, was accomplished laparoscopically. A drain was placed. He had some postoperative bleeding but symptoms resolved over 24 hours. Discharged home on February 13th to be followed in the office in 7 to 10 days' time. Bathing, activity and driving instructions were given to the patient.   DISCHARGE MEDICATIONS: Include:  Serevent Diskus 50 mcg b.i.d., Valium 5 mg p.o. at bedtime p.r.n., Cymbalta 30 mg b.i.d., lisinopril 10 mg daily, Percocet 10/325 one q. 8 hours p.r.n., nitroglycerin 0.4 mg sublingually p.r.n. and metoprolol 12.5 mg b.i.d..    FINAL DISCHARGE DIAGNOSIS: Acute cholecystitis.   SURGERY:  Laparoscopic cholecystectomy.    ____________________________ Rodena Goldmann III, MD rle:ja D: 10/04/2012 17:22:34 ET T: 10/05/2012 08:23:32 ET JOB#: [id number]  cc: Micheline Maze, MD, <Dictator> [name], MD Minna Merritts, MD  Rodena Goldmann MD ELECTRONICALLY SIGNED 10/08/2012 23:18

## 2014-12-04 NOTE — Op Note (Signed)
PATIENT NAME:  Matthew [name], Matthew [name] MR#:  [id number] DATE OF BIRTH:  [birth year]  DATE OF PROCEDURE:  05/19/2013  PREOPERATIVE DIAGNOSIS: Left hip periprosthetic posterolateral dislocation with dissociation of acetabular locking liner.   POSTOPERATIVE DIAGNOSIS: Left hip periprosthetic posterolateral dislocation with dissociation of acetabular locking liner.  PROCEDURE PERFORMED: Attempted closed reduction of right hip periprosthetic dislocation.   SURGEON: Maebelle Munroe, MD.   ASSISTANTS:  None.   COMPLICATIONS: None apparent.   ESTIMATED BLOOD LOSS: Not applicable.   ANESTHESIA: Conscious sedation.   OPERATIVE FINDINGS: Irreducible left hip periprosthetic posterolateral dislocation.   INDICATIONS: The patient is a 64 year old male who underwent left hip acetabular revision approximately 2 months ago by Dr. Gerrit Heck of [facility]. He was lying on his right side in the lateral position, and states that he felt a shift in his hip.  He was unable to ambulate.  He was brought to [facility] Emergency Department where he was diagnosed with a posterolateral hip dislocation. Risks, benefits and alternatives were discussed with him, including inability to reduce the hip, periprosthetic fracture and persistent pain, and damage to blood vessels and nerves. He appeared to understand these and consented to conscious sedation and hip relocation.   DESCRIPTION OF PROCEDURE: The correct procedural site was identified. The conscious sedation was administered, and informed consent had been obtained. An attempted closed reduction maneuver was performed with the hip in varying degrees of flexion and internal rotation, including flexion to approximately 90 degrees with internal and external rotation. I felt that the femoral head was able to be perched on the acetabulum, though I could not reduce the hip. The apparent time of injury was approximately 9:00 this evening, making approximately a 7-hour interval  between now and the time of injury. Intraoperative radiograph obtained showed persistent posterolateral dislocation.   Postprocedure motor and light touch sensation examination are intact at the left foot, including 2+ dorsalis pedis and posterior tibialis pulse.   The patient has been accepted by Dr. Sharen Counter of the [facility]; he is a fellow. He will be transferred to the [facility] this evening.  ____________________________ Maebelle Munroe, MD jfs:cg D: 05/19/2013 04:26:00 ET T: 05/19/2013 05:31:04 ET JOB#: [id number]  cc: Maebelle Munroe, MD, <Dictator> Maebelle Munroe MD ELECTRONICALLY SIGNED 06/27/2013 21:19
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
## 2014-12-04 NOTE — Consult Note (Signed)
PATIENT NAME:  Matthew [name], Matthew [name] MR#:  [id number] DATE OF BIRTH:  [birth year]  DATE OF CONSULTATION:  05/19/2013  REFERRING PHYSICIAN:   CONSULTING PHYSICIAN:  Maebelle Munroe, MD  CHIEF COMPLAINT: Left hip pain.   HISTORY OF PRESENT ILLNESS: The patient is a 65 year old male who has undergone 5 prior hip surgeries for, initially, arthritis and then persistent hip instability. He states that he was lying on his right side this evening and felt a pop without trauma. He was unable to ambulate. He was brought to [facility] Emergency Department, where he was diagnosed with a posterolateral hip dislocation.   He complains of sharp, intermittent pain diffusely in the left hip.   PAST MEDICAL HISTORY: Remarkable for COPD, hypertension, and depression.   CURRENT MEDICATIONS: Include albuterol, ipratropium, Cymbalta, lisinopril and Serevent Diskus inhaler.   ALLERGIES: INCLUDE DEMEROL AND TALWIN.   SOCIAL HISTORY: Lives at home with his wife.  Nonsmoker, nondrinker.   PHYSICAL EXAMINATION:  GENERAL: Pleasant, alert, cooperative male, appearing his stated age.  VITAL SIGNS: On presentation to the Emergency Department, temperature of 98, pulse of 67, a blood pressure of 184/85, 96% room air saturation.  PSYCHIATRIC: Mood and affect appropriate.  LUNGS: Clear to auscultation bilaterally.  HEART: Regular rate and rhythm. Normal S1 and S2.  LYMPHATIC: No swelling of the left hip.  SKIN:  Left hip skin is clean, dry and intact.  VASCULAR: 2+ dorsalis pedis and posterior tibialis pulse, left lower extremity.  NEUROLOGIC: Motor and light touch sensation examination intact at the left foot.  HEENT: Normocephalic, atraumatic. Sclerae clear. Oral mucosa membranes are slightly dry.  MUSCULOSKELETAL: Reveals moderate tenderness to palpation about the posterolateral aspect of the left hip with the left lower extremity being shortened and slightly internally rotated.   Radiographs demonstrate a posterolateral  left periprosthetic hip dislocation with dissociation of the acetabular constrained locking ring as well.   PROCEDURE NOTE: Closed reduction, left hip periprosthetic dislocation.  I will dictate that as a separate operative note.  IMPRESSION: Recurrent periprosthetic instability, left hip.   PLAN: Closed reduction attempted under conscious sedation this evening, and I was unable to reduce the hip. Apparently the injury occurred around 9:00, approximately 7 hours prior to attempted closed reduction. I have contacted [facility] [facility], Dr. Maurine Minister, the total joint fellow, who has agreed to accept the patient. I did discuss whether or not to reduce the hip prior to the attempted relocation, and he did state that that was acceptable to proceed with the attempted closed reduction. He will be transferred to the [facility] this morning for further care. Postprocedure motor and light touch sensation examination were intact at the left foot, and there was a 2+ dorsalis pedis and posterior tibialis pulse. I did obtain a hardcopy radiograph after the attempted closed reduction, which did show a persistent posterolateral dislocation.   ____________________________ Maebelle Munroe, MD jfs:cg D: 05/19/2013 04:23:07 ET T: 05/19/2013 05:01:03 ET JOB#: [id number]  cc: Maebelle Munroe, MD, <Dictator> Maebelle Munroe MD ELECTRONICALLY SIGNED 06/27/2013 21:18

## 2014-12-04 NOTE — Consult Note (Signed)
Brief Consult Note: Diagnosis: huge gallstone, acute cholecystitis.   Patient was seen by consultant.   Consult note dictated.   Comments: Likely will need an open CCY since he has had 4 laparotomies and now has mesh. Dr Pat Patrick will see and re-evaluate tomorrow.  Electronic Signatures: Consuela Mimes (MD)  (Signed 09-Feb-14 21:25)  Authored: Brief Consult Note   Last Updated: 09-Feb-14 21:25 by Consuela Mimes (MD)
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
## 2014-12-05 NOTE — Op Note (Signed)
PATIENT NAME:  Matthew [name], Matthew [name] MR#:  [id number] DATE OF BIRTH:  Nov 29, 1950  DATE OF PROCEDURE:  09/15/2013  PREOPERATIVE DIAGNOSIS: Posterior dislocation of left total hip arthroplasty.   POSTOPERATIVE DIAGNOSIS: Posterior dislocation of left total hip arthroplasty.   PROCEDURE PERFORMED: Closed reduction of dislocated left total hip arthroplasty under general anesthesia.   SURGEON: Laurice Record. Hooten.   ANESTHESIA: General.   ESTIMATED BLOOD LOSS: None.   INDICATIONS FOR SURGERY: The patient is a 64 year old male with remote history of left total hip arthroplasty and subsequent revision for recurrent dislocations of left total hip. He had apparently just seen Dr. Smith Mince on Friday and had been allowed to discontinue the hip abduction brace. The patient apparently was lying supine in the bed and felt the left hip "pop out." X-rays demonstrated a dislocation of the left hip. The locking ring for the constrained liner was noted to be around the neck of the femoral component and had apparently been disengaged from the polyethylene.  After discussion of the risks and benefits of surgical intervention, the patient expressed understanding of the risks, benefits, and agreed with plans for surgical intervention.   PROCEDURE IN DETAIL: The patient brought into the Operating Room, and after adequate general anesthesia was achieved, a "timeout" was performed. The left hip was flexed and traction was applied. First with external rotation and then subsequently internal rotation of the hip, there was a palpable "clunk." After confirmation of reduction of the femoral head in the cup, direct axial compressive forces were applied for engaging the femoral head in the constrained liner. The hip was placed through a range of motion with good maintenance of the reduction.   The patient tolerated the procedure well. He was transported to the recovery room in stable condition.   ____________________________ Laurice Record. Holley Bouche., MD jph:sg D: 09/16/2013 06:15:54 ET T: 09/16/2013 09:50:47 ET JOB#: [id number]  cc: Jeneen Rinks P. Holley Bouche., MD, <Dictator> Laurice Record Holley Bouche MD ELECTRONICALLY SIGNED 10/03/2013 6:39

## 2014-12-05 NOTE — Consult Note (Signed)
64 year old male with recurrent left  total hip dislocation.  Plan to bring to OR tonight to reduce.    Electronic Signatures: Park Breed (MD)  (Signed on 15-Dec-15 19:14)  Authored  Last Updated: 15-Dec-15 19:14 by Park Breed (MD)
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
## 2014-12-05 NOTE — Op Note (Signed)
PATIENT NAME:  Matthew [name], Matthew [name] MR#:  [id number] DATE OF BIRTH:  10/13/1950  DATE OF PROCEDURE:  05/06/2014  DIAGNOSIS: Left posterior total hip dislocation.   POSTPROCEDURE DIAGNOSIS: Left posterior total hip dislocation.   PROCEDURE: Closed reduction left him.  ANESTHESIA: Monitored anesthesia from ER physician.    DESCRIPTION OF PROCEDURE: After timeout procedure completed the patient was given etomidate. When he was adequately relaxed the hip was brought into extreme flexion with external rotation and great deal of traction applied with gentle rotation and persistent traction. A clunk was felt and the leg was brought out into extension and had appropriate rotation in length. There was no blood loss. No complications.   SPECIMEN: No specimen.   Postoperative x-ray pending, but should show reduced total hip. He is to have revision next month by someone in [place] to hopefully prevent recurrence    ____________________________ Laurene Footman, MD mjm:JT D: 05/06/2014 20:47:52 ET T: 05/07/2014 00:57:47 ET JOB#: [id number]  cc: Laurene Footman, MD, <Dictator> Laurene Footman MD ELECTRONICALLY SIGNED 05/07/2014 6:21
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
## 2014-12-05 NOTE — Consult Note (Signed)
Brief Consult Note: Diagnosis: L hip dislocation.   Patient was seen by consultant.   Consult note dictated.   Recommend to proceed with surgery or procedure.   Orders entered.   Comments: Low risk for surgery.  Pending examination of EKG proceed as planned.  Electronic Signatures: Harrie Foreman (MD)  (Signed [phone] 22:12)  Authored: Brief Consult Note   Last Updated: 12-Nov-15 22:12 by Harrie Foreman (MD)
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
## 2014-12-05 NOTE — Consult Note (Signed)
Brief Consult Note: Diagnosis: Recurrent dislocation left total hip arthroplasty.   Patient was seen by consultant.   Consult note dictated.   Recommend to proceed with surgery or procedure.   Orders entered.   Discussed with Attending MD.   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  Last dislocation in July 2015.  Due for revision surgery in December at [facility] [place].  Now with another dislocation.  Patient ate at 3:30 pm.  Will admit over night, place in buck's traction with muscle relaxer and plan for closed reduction in the AM.  Electronic Signatures: Thornton Park (MD)  (Signed [phone] 19:42)  Authored: Brief Consult Note   Last Updated: 12-Nov-15 19:42 by Thornton Park (MD)

## 2014-12-05 NOTE — Consult Note (Signed)
PATIENT NAME:  Matthew [name], ANZUALDA MR#:  [id number] DATE OF BIRTH:  06/20/1951  DATE OF CONSULTATION:  03/07/2014  REFERRING PHYSICIAN:  Wells Guiles L. Reita Cliche, [place] PHYSICIAN:  Mardene Sayer, MD   CHIEF COMPLAINT:  Left hip pain.   HISTORY: The patient is a 64 year old male with a long history of surgery related to his bilateral hips. He has had multiple total hip arthroplasty procedures including multiple revisions. His most recent were done by Dr. Gerrit Heck at [facility]. The patient was in his usual state of health when he moved his hip and he felt a pop. He had immediate pain and is brought for evaluation.   PAST MEDICAL HISTORY: Per EMR.   FAMILY HISTORY: Noncontributory.   SOCIAL HISTORY: The patient lives at home. He does not drink or smoke.   REVIEW OF SYSTEMS: Other than his multiple orthopedic problems, the patient denies any relevant symptoms.   PHYSICAL EXAMINATION:  GENERAL: The patient is awake and alert. He is in no apparent distress.  HEAD:  Atraumatic.  NECK:  Supple.  CHEST:  Equal and symmetric.  RESPIRATIONS: Unlabored.  ABDOMEN:  Soft.  EXTREMITIES:  Left lower extremity is held in a shortened position. He has pain with any movement in the left lower extremity. Pulses are 2+. He has intact sciatic nerve function in the left lower extremity as demonstrated by intact tibialis anterior motor function, and sensation throughout his foot. Unable to test the femoral nerve function secondary to pain.   IMAGING:  X-rays demonstrate a left dislocated total hip arthroplasty. There is actually disassociation of the components, with the constrained liner being disassociated from the acetabular component.   DISCUSSION:  I had a long discussion with the patient. I explained that he has a disassociative total hip dislocation. I explained that this is nonamenable to a closed reduction, secondary to inter-component failure. I explained that this will require an open reduction  with a total hip revision. He verbalizes understanding of this. After going over the nature of the procedure, the patient wished to be transferred to [facility] under the care of his previous orthopedic surgeon. I think that this is very reasonable, given the complexity of the case. Furthermore, given the fact that he is medically stable, he is appropriate for transfer. The appropriate forms were filled out for transfer and the patient was transferred for definitive care.     ____________________________ Mardene Sayer, MD pa:ds D: 03/08/2014 18:18:48 ET T: 03/08/2014 22:13:41 ET JOB#: [id number]  cc: Mardene Sayer, MD, <Dictator> Raelyn Ensign. Stephanie Acre, MD PhD Orthopaedic Surgery ELECTRONICALLY SIGNED 03/08/2014 22:54

## 2014-12-05 NOTE — Op Note (Signed)
PATIENT NAME:  Matthew [name], Matthew [name] MR#:  [id number] DATE OF BIRTH:  [birth year]  DATE OF PROCEDURE:  07/28/2014  PREOPERATIVE DIAGNOSIS:  Posterior dislocation, left total hip replacement.   POSTOPERATIVE DIAGNOSIS:  Posterior dislocation, left total hip replacement.   PROCEDURE PERFORMED: Closed reduction of left total hip replacement with application of a knee immobilizer.   SURGEON: Park Breed, M.D.   ANESTHESIA:  MAC.   COMPLICATIONS: None.   DESCRIPTION OF PROCEDURE:  The patient was brought to the Operating Room where he  underwent satisfactory sedation by MAC anesthesia.  The hip was flexed and traction directed toward the ceiling and distally and after pulling minorly the hip was felt to pop back in place. There was good stability and good motion. A knee immobilizer was applied to prevent him from flexing the knee up and causing further problems. The patient was awakened and transferred to a stretcher and taken to recovery in good condition.  The patient was supposed to have revision hip surgery by Dr. Gerrit Heck down in [place] this week but his insurance got messed up and he will contact Dr. Gerrit Heck and continue getting his revision hip surgery arranged.  He has pain medicine at home from Dr. Gerrit Heck.   ____________________________ Park Breed, MD hem:at D: 07/28/2014 23:10:05 ET T: 07/29/2014 09:10:35 ET JOB#: [id number]  cc: Park Breed, MD, <Dictator> Park Breed MD ELECTRONICALLY SIGNED 07/29/2014 12:11

## 2014-12-06 NOTE — Op Note (Signed)
PATIENT NAME:  Matthew [name], Matthew [name] MR#:  [id number] DATE OF BIRTH:  Jun 12, 1951  DATE OF PROCEDURE:  09/25/2011  PREOPERATIVE DIAGNOSIS: Posterior dislocation of left total hip arthroplasty.   POSTOPERATIVE DIAGNOSIS: Posterior dislocation of left total hip arthroplasty.   PROCEDURE PERFORMED: Closed reduction of dislocated left total hip arthroplasty.   SURGEON: Laurice Record. Holley Bouche., MD  ANESTHESIA: General.   ESTIMATED BLOOD LOSS: None.   INDICATIONS FOR SURGERY: The patient is a 64 year old male who has a remote history of left total hip arthroplasty performed by Dr. Christophe Louis. The patient has had multiple dislocations of the left hip in the past. Prior to presentation today, the patient apparently was lying in the bed and rolled to one side and felt the left hip "pop out". X-rays demonstrated dislocated left total hip arthroplasty. After discussion of the risks and benefits of surgical intervention, the patient expressed his understanding of the risks and benefits and agreed with plans for closed reduction under anesthesia.   PROCEDURE IN DETAIL: Patient was brought into the Operating Room and, after adequate general anesthesia was achieved, the patient's left hip was flexed, distracted, and then rotated. There was soft shift noted and C-arm images demonstrated concentric reduction of the left hip. The hip was stable with the hip flexed and abducted. No evidence of loosening was appreciated. Hip adduction pillow was placed.   Patient tolerated procedure well. He was transported to the recovery room in stable condition.    ____________________________ Laurice Record. Holley Bouche., MD jph:cms D: 09/25/2011 20:47:05 ET T: 09/26/2011 09:37:06 ET  JOB#: [id number] cc: Jeneen Rinks P. Holley Bouche., MD, <Dictator> Laurice Record Holley Bouche MD ELECTRONICALLY SIGNED 09/27/2011 19:01
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
## 2014-12-06 NOTE — Op Note (Signed)
PATIENT NAME:  Matthew [name], Matthew [name] MR#:  [id number] DATE OF BIRTH:  07-23-1951  DATE OF PROCEDURE:  11/18/2011  PREOPERATIVE DIAGNOSIS: Dislocated left total hip arthroplasty.  POSTOPERATIVE DIAGNOSIS: Dislocated left total hip arthroplasty.  PROCEDURE PERFORMED: Closed reduction of left total hip arthroplasty using conscious sedation.  SURGEON: Donzetta Starch. Emrik Erhard, MD  INDICATIONS FOR PROCEDURE: The patient is a 64 year old male, who is a patient of Dr. Margaretmary Eddy, who has previously undergone bilateral total hip arthroplasties approximately 10 to 12 years ago. He had multiples dislocation events regarding the left hip pain. He states that today, prior to his presentation, he was sitting down on the edge of his bed not wearing his brace in preparation for a shower when he experienced sharp pain and movement in his left hip. He subsequently presented to the emergency department for evaluation and through x-ray and physical examination was found a dislocated left total hip arthroplasty. The patient was advised of the risks, benefits, and alternatives of operative versus nonoperative management and elected to proceed with closed reduction using sedation, in the emergency department. Verbal consent was obtained, in addition to other documented consents through the emergency department. The patient was advised that these risks included but were not limited to the risk of bleeding or damage to nerves, vessels or other structures in the area. He may have recurrent dislocations or may need further surgery in the future. The patient was in agreement with these factors and elected to proceed.   DESCRIPTION OF PROCEDURE: Following administration of etomidate and fentanyl, by the emergency department staff, and the patient was found to have a sufficient level of sedation, the patient's left hip was flexed well past 90 degrees, adducted well past midline, and forward traction applied. An audible clunk was then  appreciated. The patient then had a nonrestricted range of motion about his left hip and his leg length was noted to be significantly longer than his prereduction leg length. The patient was placed into an abduction pillow. Following resolution of his sedation, the neurovascular status of the limb was once again checked and found to be intact with EHL, FHL, dorsal flexion, plantar flexion, and motor function intact. Sensation was intact throughout the left foot, and he had a 2+ dorsalis pedis pulse with a warm and well perfused foot. Postreduction x-rays revealed stable components with appropriately reduced left total hip arthroplasty.  DISPOSITION: The patient will be weight-bearing as tolerated to the left lower extremity with instructions to limit full weight-bearing as he is able. He is to continue to wear his normal hip abduction brace at all times and he is encouraged to sleep in the brace and/or the abduction pillow for added security at night. The patient will be in contact with either Dr. Margaretmary Eddy for further follow-up or with his currently scheduled follow-up with a specialist in the [facility] area for potential hip revision due to recurrent dislocations.  ____________________________ Donzetta Starch Khye Hochstetler, MD cvs:slb D: 11/18/2011 15:42:46 ET T: 11/19/2011 14:48:48 ET JOB#: [id number]  cc: Donzetta Starch. Carisma Troupe, MD, <Dictator> Margaret Pyle MD ELECTRONICALLY SIGNED 11/23/2011 21:24

## 2014-12-13 NOTE — Op Note (Signed)
PATIENT NAME:  Matthew [name], Matthew [name] MR#:  [id number] DATE OF BIRTH:  October 01, 1950  DATE OF PROCEDURE:  09/19/2014  PREOPERATIVE DIAGNOSIS: Dislocated left total hip.   POSTOPERATIVE DIAGNOSIS:  Dislocated left total hip.  PROCEDURE: Closed reduction left total hip under general anesthesia.   SURGEON:  Laurene Footman, Md.  ANESTHESIA:  General.   DESCRIPTION OF PROCEDURE: The patient was brought to the Operating Room and after adequate general anesthesia was obtained, appropriate patient identification and timeout procedures were completed.  Fluoroscopy was used during the procedure. Longitudinal traction with the hip in extreme flexion was carried out with gentle external rotation. The head was reduced and permanent C-arm view obtained of this. A short knee immobilizer was applied to prevent for knee and hip flexion and prevent recurrence. The patient was sent to the recovery room in stable condition. There was no blood loss. No specimen.   COMPLICATIONS: No complications.      ____________________________ Laurene Footman, MD mjm:DT D: 09/19/2014 13:31:47 ET T: 09/19/2014 15:44:26 ET JOB#: [id number]  cc: Laurene Footman, MD, <Dictator> Laurene Footman MD ELECTRONICALLY SIGNED 09/19/2014 17:22
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
## 2014-12-13 NOTE — Op Note (Signed)
PATIENT NAME:  Matthew [name], Matthew [name] MR#:  [id number] DATE OF BIRTH:  10/06/1950  DATE OF PROCEDURE:  09/22/2014  PREOPERATIVE DIAGNOSIS: Left dislocated total hip arthroplasty.   POSTOPERATIVE DIAGNOSIS: Left dislocated total hip arthroplasty.   PROCEDURE: Closed reduction of left dislocated total hip arthroplasty prosthesis.   ANESTHESIA: General.   SURGEON: Timoteo Gaul, MD   ESTIMATED BLOOD LOSS: None.   COMPLICATIONS: None.   INDICATION FOR PROCEDURE: The patient is a 64 year old male who has had recurrent left hip dislocations. His last one was on 09/19/2014.. The patient had been discharged from the hospital and he dislocated it again while trying to sit down on his bed. He was brought to the [facility] Emergency Department yesterday, where x-rays confirmed a redislocation. I recommended a repeat closed reduction, and the patient understood and agreed with the risks of this procedure.   PROCEDURE IN DETAIL: The patient was brought to the Operating Room, where he underwent general anesthesia with an LMA. A timeout was performed to verify the patient's name, date of birth, medical record number, correct site of surgery, and correct procedure to be performed. Once all in attendance were in agreement, the case began.   The patient had his hip flexed 90 degrees. Longitudinal traction was then applied to the left hip with gentle internal and external rotation. The patient's leg was then brought into extension. His leg length had been regained. FluoroScan images in the AP and lateral planes confirmed the hip prosthesis was relocated. He was placed in a knee immobilizer. He was then awakened and transferred to a hospital bed. He was brought to the PACU in stable condition. X-ray films from the operating room and a postoperative x-ray in the PACU confirmed the hip is relocated. The patient will remain in his knee immobilizer and hip abduction pillow. He has a hip abduction brace that was  ordered for him back in November during a previous dislocation. He was instructed to put this hip abduction brace back in place when he returns home and to wear it at all times. He is scheduled to follow up with Dr. Gerrit Heck, at [facility] in [place], in March for a left hip revision.    ____________________________ Timoteo Gaul, MD klk:mw D: 09/22/2014 14:01:45 ET T: 09/22/2014 16:48:43 ET JOB#: [id number]  cc: Timoteo Gaul, MD, <Dictator> Timoteo Gaul MD ELECTRONICALLY SIGNED 09/25/2014 18:53
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
## 2015-05-31 DIAGNOSIS — G894 Chronic pain syndrome: Secondary | ICD-10-CM | POA: Diagnosis not present

## 2015-05-31 DIAGNOSIS — M79605 Pain in left leg: Secondary | ICD-10-CM | POA: Diagnosis not present

## 2015-05-31 DIAGNOSIS — G2581 Restless legs syndrome: Secondary | ICD-10-CM | POA: Diagnosis not present

## 2015-05-31 DIAGNOSIS — Z79899 Other long term (current) drug therapy: Secondary | ICD-10-CM | POA: Diagnosis not present

## 2015-05-31 DIAGNOSIS — M5417 Radiculopathy, lumbosacral region: Secondary | ICD-10-CM | POA: Diagnosis not present

## 2015-05-31 DIAGNOSIS — Z79891 Long term (current) use of opiate analgesic: Secondary | ICD-10-CM | POA: Diagnosis not present

## 2015-06-28 DIAGNOSIS — M79605 Pain in left leg: Secondary | ICD-10-CM | POA: Diagnosis not present

## 2015-06-28 DIAGNOSIS — M5417 Radiculopathy, lumbosacral region: Secondary | ICD-10-CM | POA: Diagnosis not present

## 2015-06-28 DIAGNOSIS — G894 Chronic pain syndrome: Secondary | ICD-10-CM | POA: Diagnosis not present

## 2015-06-28 DIAGNOSIS — G2581 Restless legs syndrome: Secondary | ICD-10-CM | POA: Diagnosis not present

## 2015-06-28 DIAGNOSIS — Z79899 Other long term (current) drug therapy: Secondary | ICD-10-CM | POA: Diagnosis not present

## 2015-08-02 DIAGNOSIS — G894 Chronic pain syndrome: Secondary | ICD-10-CM | POA: Diagnosis not present

## 2015-08-02 DIAGNOSIS — M25562 Pain in left knee: Secondary | ICD-10-CM | POA: Diagnosis not present

## 2015-08-02 DIAGNOSIS — M79605 Pain in left leg: Secondary | ICD-10-CM | POA: Diagnosis not present

## 2015-08-02 DIAGNOSIS — G2581 Restless legs syndrome: Secondary | ICD-10-CM | POA: Diagnosis not present

## 2015-08-02 DIAGNOSIS — M5417 Radiculopathy, lumbosacral region: Secondary | ICD-10-CM | POA: Diagnosis not present

## 2015-09-01 DIAGNOSIS — Z79899 Other long term (current) drug therapy: Secondary | ICD-10-CM | POA: Diagnosis not present

## 2015-09-01 DIAGNOSIS — M79605 Pain in left leg: Secondary | ICD-10-CM | POA: Diagnosis not present

## 2015-09-01 DIAGNOSIS — G2581 Restless legs syndrome: Secondary | ICD-10-CM | POA: Diagnosis not present

## 2015-09-01 DIAGNOSIS — G894 Chronic pain syndrome: Secondary | ICD-10-CM | POA: Diagnosis not present

## 2015-09-01 DIAGNOSIS — M25562 Pain in left knee: Secondary | ICD-10-CM | POA: Diagnosis not present

## 2015-09-01 DIAGNOSIS — M5417 Radiculopathy, lumbosacral region: Secondary | ICD-10-CM | POA: Diagnosis not present

## 2015-09-22 DIAGNOSIS — M5417 Radiculopathy, lumbosacral region: Secondary | ICD-10-CM | POA: Diagnosis not present

## 2015-09-22 DIAGNOSIS — G894 Chronic pain syndrome: Secondary | ICD-10-CM | POA: Diagnosis not present

## 2015-09-28 DIAGNOSIS — Z79899 Other long term (current) drug therapy: Secondary | ICD-10-CM | POA: Diagnosis not present

## 2015-09-28 DIAGNOSIS — M5417 Radiculopathy, lumbosacral region: Secondary | ICD-10-CM | POA: Diagnosis not present

## 2015-09-28 DIAGNOSIS — G894 Chronic pain syndrome: Secondary | ICD-10-CM | POA: Diagnosis not present

## 2015-10-26 DIAGNOSIS — Z79891 Long term (current) use of opiate analgesic: Secondary | ICD-10-CM | POA: Diagnosis not present

## 2015-10-26 DIAGNOSIS — Z79899 Other long term (current) drug therapy: Secondary | ICD-10-CM | POA: Diagnosis not present

## 2015-10-26 DIAGNOSIS — G2581 Restless legs syndrome: Secondary | ICD-10-CM | POA: Diagnosis not present

## 2015-10-26 DIAGNOSIS — G894 Chronic pain syndrome: Secondary | ICD-10-CM | POA: Diagnosis not present

## 2015-10-26 DIAGNOSIS — M79605 Pain in left leg: Secondary | ICD-10-CM | POA: Diagnosis not present

## 2015-10-26 DIAGNOSIS — M5417 Radiculopathy, lumbosacral region: Secondary | ICD-10-CM | POA: Diagnosis not present

## 2015-10-26 DIAGNOSIS — M25562 Pain in left knee: Secondary | ICD-10-CM | POA: Diagnosis not present

## 2015-12-21 DIAGNOSIS — G2581 Restless legs syndrome: Secondary | ICD-10-CM | POA: Diagnosis not present

## 2015-12-21 DIAGNOSIS — M1711 Unilateral primary osteoarthritis, right knee: Secondary | ICD-10-CM | POA: Diagnosis not present

## 2015-12-21 DIAGNOSIS — M1712 Unilateral primary osteoarthritis, left knee: Secondary | ICD-10-CM | POA: Diagnosis not present

## 2015-12-21 DIAGNOSIS — G894 Chronic pain syndrome: Secondary | ICD-10-CM | POA: Diagnosis not present

## 2015-12-21 DIAGNOSIS — M545 Low back pain: Secondary | ICD-10-CM | POA: Diagnosis not present

## 2015-12-21 DIAGNOSIS — Z79899 Other long term (current) drug therapy: Secondary | ICD-10-CM | POA: Diagnosis not present

## 2015-12-21 DIAGNOSIS — M79605 Pain in left leg: Secondary | ICD-10-CM | POA: Diagnosis not present

## 2015-12-21 DIAGNOSIS — M5417 Radiculopathy, lumbosacral region: Secondary | ICD-10-CM | POA: Diagnosis not present

## 2016-03-23 DIAGNOSIS — M1711 Unilateral primary osteoarthritis, right knee: Secondary | ICD-10-CM | POA: Diagnosis not present

## 2016-03-23 DIAGNOSIS — M545 Low back pain: Secondary | ICD-10-CM | POA: Diagnosis not present

## 2016-03-23 DIAGNOSIS — G2581 Restless legs syndrome: Secondary | ICD-10-CM | POA: Diagnosis not present

## 2016-03-23 DIAGNOSIS — Z79899 Other long term (current) drug therapy: Secondary | ICD-10-CM | POA: Diagnosis not present

## 2016-03-23 DIAGNOSIS — M1712 Unilateral primary osteoarthritis, left knee: Secondary | ICD-10-CM | POA: Diagnosis not present

## 2016-03-23 DIAGNOSIS — M79605 Pain in left leg: Secondary | ICD-10-CM | POA: Diagnosis not present

## 2016-03-23 DIAGNOSIS — G894 Chronic pain syndrome: Secondary | ICD-10-CM | POA: Diagnosis not present

## 2016-03-23 DIAGNOSIS — M5417 Radiculopathy, lumbosacral region: Secondary | ICD-10-CM | POA: Diagnosis not present

## 2016-04-19 DIAGNOSIS — M79605 Pain in left leg: Secondary | ICD-10-CM | POA: Diagnosis not present

## 2016-04-19 DIAGNOSIS — M1712 Unilateral primary osteoarthritis, left knee: Secondary | ICD-10-CM | POA: Diagnosis not present

## 2016-04-19 DIAGNOSIS — M1711 Unilateral primary osteoarthritis, right knee: Secondary | ICD-10-CM | POA: Diagnosis not present

## 2016-04-19 DIAGNOSIS — Z79899 Other long term (current) drug therapy: Secondary | ICD-10-CM | POA: Diagnosis not present

## 2016-04-19 DIAGNOSIS — G2581 Restless legs syndrome: Secondary | ICD-10-CM | POA: Diagnosis not present

## 2016-04-19 DIAGNOSIS — M545 Low back pain: Secondary | ICD-10-CM | POA: Diagnosis not present

## 2016-04-19 DIAGNOSIS — G894 Chronic pain syndrome: Secondary | ICD-10-CM | POA: Diagnosis not present

## 2016-04-19 DIAGNOSIS — M5417 Radiculopathy, lumbosacral region: Secondary | ICD-10-CM | POA: Diagnosis not present

## 2016-07-12 DIAGNOSIS — M1712 Unilateral primary osteoarthritis, left knee: Secondary | ICD-10-CM | POA: Diagnosis not present

## 2016-07-12 DIAGNOSIS — G894 Chronic pain syndrome: Secondary | ICD-10-CM | POA: Diagnosis not present

## 2016-07-12 DIAGNOSIS — M1711 Unilateral primary osteoarthritis, right knee: Secondary | ICD-10-CM | POA: Diagnosis not present

## 2016-07-12 DIAGNOSIS — M545 Low back pain: Secondary | ICD-10-CM | POA: Diagnosis not present

## 2016-07-12 DIAGNOSIS — Z79891 Long term (current) use of opiate analgesic: Secondary | ICD-10-CM | POA: Diagnosis not present

## 2016-09-18 DIAGNOSIS — M79605 Pain in left leg: Secondary | ICD-10-CM | POA: Diagnosis not present

## 2016-09-18 DIAGNOSIS — Z79891 Long term (current) use of opiate analgesic: Secondary | ICD-10-CM | POA: Diagnosis not present

## 2016-09-18 DIAGNOSIS — M5417 Radiculopathy, lumbosacral region: Secondary | ICD-10-CM | POA: Diagnosis not present

## 2016-09-18 DIAGNOSIS — G2581 Restless legs syndrome: Secondary | ICD-10-CM | POA: Diagnosis not present

## 2016-09-18 DIAGNOSIS — M545 Low back pain: Secondary | ICD-10-CM | POA: Diagnosis not present

## 2016-09-18 DIAGNOSIS — M1711 Unilateral primary osteoarthritis, right knee: Secondary | ICD-10-CM | POA: Diagnosis not present

## 2016-09-18 DIAGNOSIS — Z79899 Other long term (current) drug therapy: Secondary | ICD-10-CM | POA: Diagnosis not present

## 2016-09-18 DIAGNOSIS — M1712 Unilateral primary osteoarthritis, left knee: Secondary | ICD-10-CM | POA: Diagnosis not present

## 2016-09-18 DIAGNOSIS — G4700 Insomnia, unspecified: Secondary | ICD-10-CM | POA: Diagnosis not present

## 2016-09-18 DIAGNOSIS — G894 Chronic pain syndrome: Secondary | ICD-10-CM | POA: Diagnosis not present

## 2016-09-25 DIAGNOSIS — M5416 Radiculopathy, lumbar region: Secondary | ICD-10-CM | POA: Diagnosis not present

## 2016-09-25 DIAGNOSIS — E669 Obesity, unspecified: Secondary | ICD-10-CM | POA: Diagnosis not present

## 2016-09-26 DIAGNOSIS — Z79891 Long term (current) use of opiate analgesic: Secondary | ICD-10-CM | POA: Diagnosis not present

## 2016-09-26 DIAGNOSIS — G2581 Restless legs syndrome: Secondary | ICD-10-CM | POA: Diagnosis not present

## 2016-09-26 DIAGNOSIS — M79605 Pain in left leg: Secondary | ICD-10-CM | POA: Diagnosis not present

## 2016-09-26 DIAGNOSIS — Z79899 Other long term (current) drug therapy: Secondary | ICD-10-CM | POA: Diagnosis not present

## 2016-09-26 DIAGNOSIS — M545 Low back pain: Secondary | ICD-10-CM | POA: Diagnosis not present

## 2016-09-26 DIAGNOSIS — G4700 Insomnia, unspecified: Secondary | ICD-10-CM | POA: Diagnosis not present

## 2016-09-26 DIAGNOSIS — M1711 Unilateral primary osteoarthritis, right knee: Secondary | ICD-10-CM | POA: Diagnosis not present

## 2016-09-26 DIAGNOSIS — M1712 Unilateral primary osteoarthritis, left knee: Secondary | ICD-10-CM | POA: Diagnosis not present

## 2016-09-26 DIAGNOSIS — M5417 Radiculopathy, lumbosacral region: Secondary | ICD-10-CM | POA: Diagnosis not present

## 2016-09-26 DIAGNOSIS — G894 Chronic pain syndrome: Secondary | ICD-10-CM | POA: Diagnosis not present

## 2016-10-09 DIAGNOSIS — Z79899 Other long term (current) drug therapy: Secondary | ICD-10-CM | POA: Diagnosis not present

## 2016-10-09 DIAGNOSIS — M1712 Unilateral primary osteoarthritis, left knee: Secondary | ICD-10-CM | POA: Diagnosis not present

## 2016-10-09 DIAGNOSIS — M545 Low back pain: Secondary | ICD-10-CM | POA: Diagnosis not present

## 2016-10-09 DIAGNOSIS — G894 Chronic pain syndrome: Secondary | ICD-10-CM | POA: Diagnosis not present

## 2016-10-09 DIAGNOSIS — M79605 Pain in left leg: Secondary | ICD-10-CM | POA: Diagnosis not present

## 2016-10-09 DIAGNOSIS — G2581 Restless legs syndrome: Secondary | ICD-10-CM | POA: Diagnosis not present

## 2016-10-09 DIAGNOSIS — Z79891 Long term (current) use of opiate analgesic: Secondary | ICD-10-CM | POA: Diagnosis not present

## 2016-10-09 DIAGNOSIS — M1711 Unilateral primary osteoarthritis, right knee: Secondary | ICD-10-CM | POA: Diagnosis not present

## 2016-10-09 DIAGNOSIS — M5417 Radiculopathy, lumbosacral region: Secondary | ICD-10-CM | POA: Diagnosis not present

## 2016-10-09 DIAGNOSIS — G4700 Insomnia, unspecified: Secondary | ICD-10-CM | POA: Diagnosis not present

## 2016-10-23 DIAGNOSIS — M1711 Unilateral primary osteoarthritis, right knee: Secondary | ICD-10-CM | POA: Diagnosis not present

## 2016-10-23 DIAGNOSIS — G894 Chronic pain syndrome: Secondary | ICD-10-CM | POA: Diagnosis not present

## 2016-10-23 DIAGNOSIS — G2581 Restless legs syndrome: Secondary | ICD-10-CM | POA: Diagnosis not present

## 2016-10-23 DIAGNOSIS — G4700 Insomnia, unspecified: Secondary | ICD-10-CM | POA: Diagnosis not present

## 2016-10-23 DIAGNOSIS — Z79899 Other long term (current) drug therapy: Secondary | ICD-10-CM | POA: Diagnosis not present

## 2016-10-23 DIAGNOSIS — M5417 Radiculopathy, lumbosacral region: Secondary | ICD-10-CM | POA: Diagnosis not present

## 2016-10-23 DIAGNOSIS — M79605 Pain in left leg: Secondary | ICD-10-CM | POA: Diagnosis not present

## 2016-10-23 DIAGNOSIS — M545 Low back pain: Secondary | ICD-10-CM | POA: Diagnosis not present

## 2016-10-23 DIAGNOSIS — M1712 Unilateral primary osteoarthritis, left knee: Secondary | ICD-10-CM | POA: Diagnosis not present

## 2016-11-22 DIAGNOSIS — M545 Low back pain: Secondary | ICD-10-CM | POA: Diagnosis not present

## 2016-11-22 DIAGNOSIS — M1711 Unilateral primary osteoarthritis, right knee: Secondary | ICD-10-CM | POA: Diagnosis not present

## 2016-11-22 DIAGNOSIS — Z79891 Long term (current) use of opiate analgesic: Secondary | ICD-10-CM | POA: Diagnosis not present

## 2016-11-22 DIAGNOSIS — Z79899 Other long term (current) drug therapy: Secondary | ICD-10-CM | POA: Diagnosis not present

## 2016-11-22 DIAGNOSIS — G4700 Insomnia, unspecified: Secondary | ICD-10-CM | POA: Diagnosis not present

## 2016-11-22 DIAGNOSIS — G2581 Restless legs syndrome: Secondary | ICD-10-CM | POA: Diagnosis not present

## 2016-11-22 DIAGNOSIS — M79605 Pain in left leg: Secondary | ICD-10-CM | POA: Diagnosis not present

## 2016-11-22 DIAGNOSIS — G894 Chronic pain syndrome: Secondary | ICD-10-CM | POA: Diagnosis not present

## 2016-11-22 DIAGNOSIS — M1712 Unilateral primary osteoarthritis, left knee: Secondary | ICD-10-CM | POA: Diagnosis not present

## 2016-11-22 DIAGNOSIS — M5417 Radiculopathy, lumbosacral region: Secondary | ICD-10-CM | POA: Diagnosis not present

## 2016-12-28 DIAGNOSIS — Z79899 Other long term (current) drug therapy: Secondary | ICD-10-CM | POA: Diagnosis not present

## 2016-12-28 DIAGNOSIS — G2581 Restless legs syndrome: Secondary | ICD-10-CM | POA: Diagnosis not present

## 2016-12-28 DIAGNOSIS — M1711 Unilateral primary osteoarthritis, right knee: Secondary | ICD-10-CM | POA: Diagnosis not present

## 2016-12-28 DIAGNOSIS — M545 Low back pain: Secondary | ICD-10-CM | POA: Diagnosis not present

## 2016-12-28 DIAGNOSIS — G894 Chronic pain syndrome: Secondary | ICD-10-CM | POA: Diagnosis not present

## 2016-12-28 DIAGNOSIS — M5417 Radiculopathy, lumbosacral region: Secondary | ICD-10-CM | POA: Diagnosis not present

## 2016-12-28 DIAGNOSIS — M79605 Pain in left leg: Secondary | ICD-10-CM | POA: Diagnosis not present

## 2016-12-28 DIAGNOSIS — M1712 Unilateral primary osteoarthritis, left knee: Secondary | ICD-10-CM | POA: Diagnosis not present

## 2016-12-28 DIAGNOSIS — G4700 Insomnia, unspecified: Secondary | ICD-10-CM | POA: Diagnosis not present

## 2017-01-30 DIAGNOSIS — G4700 Insomnia, unspecified: Secondary | ICD-10-CM | POA: Diagnosis not present

## 2017-01-30 DIAGNOSIS — M79605 Pain in left leg: Secondary | ICD-10-CM | POA: Diagnosis not present

## 2017-01-30 DIAGNOSIS — Z79899 Other long term (current) drug therapy: Secondary | ICD-10-CM | POA: Diagnosis not present

## 2017-01-30 DIAGNOSIS — G2581 Restless legs syndrome: Secondary | ICD-10-CM | POA: Diagnosis not present

## 2017-01-30 DIAGNOSIS — M1711 Unilateral primary osteoarthritis, right knee: Secondary | ICD-10-CM | POA: Diagnosis not present

## 2017-01-30 DIAGNOSIS — M545 Low back pain: Secondary | ICD-10-CM | POA: Diagnosis not present

## 2017-01-30 DIAGNOSIS — M5417 Radiculopathy, lumbosacral region: Secondary | ICD-10-CM | POA: Diagnosis not present

## 2017-01-30 DIAGNOSIS — M1712 Unilateral primary osteoarthritis, left knee: Secondary | ICD-10-CM | POA: Diagnosis not present

## 2017-01-30 DIAGNOSIS — G894 Chronic pain syndrome: Secondary | ICD-10-CM | POA: Diagnosis not present

## 2017-02-27 DIAGNOSIS — G4700 Insomnia, unspecified: Secondary | ICD-10-CM | POA: Diagnosis not present

## 2017-02-27 DIAGNOSIS — M1712 Unilateral primary osteoarthritis, left knee: Secondary | ICD-10-CM | POA: Diagnosis not present

## 2017-02-27 DIAGNOSIS — M1711 Unilateral primary osteoarthritis, right knee: Secondary | ICD-10-CM | POA: Diagnosis not present

## 2017-02-27 DIAGNOSIS — G894 Chronic pain syndrome: Secondary | ICD-10-CM | POA: Diagnosis not present

## 2017-02-27 DIAGNOSIS — Z79899 Other long term (current) drug therapy: Secondary | ICD-10-CM | POA: Diagnosis not present

## 2017-02-27 DIAGNOSIS — M5417 Radiculopathy, lumbosacral region: Secondary | ICD-10-CM | POA: Diagnosis not present

## 2017-02-27 DIAGNOSIS — M79605 Pain in left leg: Secondary | ICD-10-CM | POA: Diagnosis not present

## 2017-02-27 DIAGNOSIS — M545 Low back pain: Secondary | ICD-10-CM | POA: Diagnosis not present

## 2017-02-27 DIAGNOSIS — G2581 Restless legs syndrome: Secondary | ICD-10-CM | POA: Diagnosis not present

## 2017-05-17 DIAGNOSIS — G2581 Restless legs syndrome: Secondary | ICD-10-CM | POA: Diagnosis not present

## 2017-05-17 DIAGNOSIS — M79605 Pain in left leg: Secondary | ICD-10-CM | POA: Diagnosis not present

## 2017-05-17 DIAGNOSIS — M5417 Radiculopathy, lumbosacral region: Secondary | ICD-10-CM | POA: Diagnosis not present

## 2017-05-17 DIAGNOSIS — M1712 Unilateral primary osteoarthritis, left knee: Secondary | ICD-10-CM | POA: Diagnosis not present

## 2017-05-17 DIAGNOSIS — Z79899 Other long term (current) drug therapy: Secondary | ICD-10-CM | POA: Diagnosis not present

## 2017-05-17 DIAGNOSIS — M1711 Unilateral primary osteoarthritis, right knee: Secondary | ICD-10-CM | POA: Diagnosis not present

## 2017-05-17 DIAGNOSIS — G4700 Insomnia, unspecified: Secondary | ICD-10-CM | POA: Diagnosis not present

## 2017-05-17 DIAGNOSIS — M545 Low back pain: Secondary | ICD-10-CM | POA: Diagnosis not present

## 2017-05-17 DIAGNOSIS — G894 Chronic pain syndrome: Secondary | ICD-10-CM | POA: Diagnosis not present

## 2017-07-19 DIAGNOSIS — G894 Chronic pain syndrome: Secondary | ICD-10-CM | POA: Diagnosis not present

## 2017-07-19 DIAGNOSIS — M1711 Unilateral primary osteoarthritis, right knee: Secondary | ICD-10-CM | POA: Diagnosis not present

## 2017-07-19 DIAGNOSIS — M170 Bilateral primary osteoarthritis of knee: Secondary | ICD-10-CM | POA: Diagnosis not present

## 2017-07-19 DIAGNOSIS — G4700 Insomnia, unspecified: Secondary | ICD-10-CM | POA: Diagnosis not present

## 2017-07-19 DIAGNOSIS — Z79899 Other long term (current) drug therapy: Secondary | ICD-10-CM | POA: Diagnosis not present

## 2017-07-19 DIAGNOSIS — M545 Low back pain: Secondary | ICD-10-CM | POA: Diagnosis not present

## 2017-07-19 DIAGNOSIS — M5417 Radiculopathy, lumbosacral region: Secondary | ICD-10-CM | POA: Diagnosis not present

## 2017-07-19 DIAGNOSIS — M79605 Pain in left leg: Secondary | ICD-10-CM | POA: Diagnosis not present

## 2017-07-19 DIAGNOSIS — G5600 Carpal tunnel syndrome, unspecified upper limb: Secondary | ICD-10-CM | POA: Diagnosis not present

## 2017-09-13 DIAGNOSIS — G5600 Carpal tunnel syndrome, unspecified upper limb: Secondary | ICD-10-CM | POA: Diagnosis not present

## 2017-09-13 DIAGNOSIS — M5417 Radiculopathy, lumbosacral region: Secondary | ICD-10-CM | POA: Diagnosis not present

## 2017-09-13 DIAGNOSIS — Z79899 Other long term (current) drug therapy: Secondary | ICD-10-CM | POA: Diagnosis not present

## 2017-09-13 DIAGNOSIS — G4700 Insomnia, unspecified: Secondary | ICD-10-CM | POA: Diagnosis not present

## 2017-09-13 DIAGNOSIS — M170 Bilateral primary osteoarthritis of knee: Secondary | ICD-10-CM | POA: Diagnosis not present

## 2017-09-13 DIAGNOSIS — M25562 Pain in left knee: Secondary | ICD-10-CM | POA: Diagnosis not present

## 2017-09-13 DIAGNOSIS — Z79891 Long term (current) use of opiate analgesic: Secondary | ICD-10-CM | POA: Diagnosis not present

## 2017-09-13 DIAGNOSIS — G894 Chronic pain syndrome: Secondary | ICD-10-CM | POA: Diagnosis not present

## 2017-09-13 DIAGNOSIS — M79605 Pain in left leg: Secondary | ICD-10-CM | POA: Diagnosis not present

## 2017-09-13 DIAGNOSIS — M545 Low back pain: Secondary | ICD-10-CM | POA: Diagnosis not present

## 2017-12-13 DIAGNOSIS — M170 Bilateral primary osteoarthritis of knee: Secondary | ICD-10-CM | POA: Diagnosis not present

## 2017-12-13 DIAGNOSIS — Z79899 Other long term (current) drug therapy: Secondary | ICD-10-CM | POA: Diagnosis not present

## 2017-12-13 DIAGNOSIS — M79605 Pain in left leg: Secondary | ICD-10-CM | POA: Diagnosis not present

## 2017-12-13 DIAGNOSIS — M25562 Pain in left knee: Secondary | ICD-10-CM | POA: Diagnosis not present

## 2017-12-13 DIAGNOSIS — G5600 Carpal tunnel syndrome, unspecified upper limb: Secondary | ICD-10-CM | POA: Diagnosis not present

## 2017-12-13 DIAGNOSIS — M545 Low back pain: Secondary | ICD-10-CM | POA: Diagnosis not present

## 2017-12-13 DIAGNOSIS — G894 Chronic pain syndrome: Secondary | ICD-10-CM | POA: Diagnosis not present

## 2017-12-13 DIAGNOSIS — M5417 Radiculopathy, lumbosacral region: Secondary | ICD-10-CM | POA: Diagnosis not present

## 2017-12-13 DIAGNOSIS — G4700 Insomnia, unspecified: Secondary | ICD-10-CM | POA: Diagnosis not present

## 2017-12-31 ENCOUNTER — Emergency Department
Admission: EM | Admit: 2017-12-31 | Discharge: 2018-01-01 | Disposition: A | Discharge status: Home / Self Care | Payer: Medicare PPO | Attending: Emergency Medicine | Admitting: Emergency Medicine

## 2017-12-31 ENCOUNTER — Encounter: Payer: Self-pay | Admitting: Emergency Medicine

## 2017-12-31 ENCOUNTER — Other Ambulatory Visit: Payer: Self-pay

## 2017-12-31 ENCOUNTER — Emergency Department: Payer: Medicare PPO

## 2017-12-31 DIAGNOSIS — R112 Nausea with vomiting, unspecified: Secondary | ICD-10-CM

## 2017-12-31 DIAGNOSIS — K5289 Other specified noninfective gastroenteritis and colitis: Secondary | ICD-10-CM | POA: Diagnosis not present

## 2017-12-31 DIAGNOSIS — R197 Diarrhea, unspecified: Secondary | ICD-10-CM

## 2017-12-31 DIAGNOSIS — K529 Noninfective gastroenteritis and colitis, unspecified: Secondary | ICD-10-CM

## 2017-12-31 DIAGNOSIS — K520 Gastroenteritis and colitis due to radiation: Secondary | ICD-10-CM | POA: Diagnosis not present

## 2017-12-31 DIAGNOSIS — R109 Unspecified abdominal pain: Secondary | ICD-10-CM | POA: Diagnosis not present

## 2017-12-31 DIAGNOSIS — I10 Essential (primary) hypertension: Secondary | ICD-10-CM | POA: Insufficient documentation

## 2017-12-31 DIAGNOSIS — Z79899 Other long term (current) drug therapy: Secondary | ICD-10-CM | POA: Insufficient documentation

## 2017-12-31 DIAGNOSIS — J45909 Unspecified asthma, uncomplicated: Secondary | ICD-10-CM | POA: Diagnosis not present

## 2017-12-31 DIAGNOSIS — R1013 Epigastric pain: Secondary | ICD-10-CM | POA: Diagnosis not present

## 2017-12-31 DIAGNOSIS — R000 Tachycardia, unspecified: Secondary | ICD-10-CM | POA: Diagnosis not present

## 2017-12-31 DIAGNOSIS — R1110 Vomiting, unspecified: Secondary | ICD-10-CM | POA: Diagnosis not present

## 2017-12-31 HISTORY — DX: Unspecified asthma, uncomplicated: J45.909

## 2017-12-31 HISTORY — DX: Unspecified dislocation of right hip, initial encounter: S73.004A

## 2017-12-31 HISTORY — DX: Essential (primary) hypertension: I10

## 2017-12-31 HISTORY — DX: Unspecified dislocation of left hip, initial encounter: S73.005A

## 2017-12-31 LAB — CBC
HEMATOCRIT: 42.8 % (ref 40.0–52.0)
HEMOGLOBIN: 14.5 g/dL (ref 13.0–18.0)
MCH: 33.7 pg (ref 26.0–34.0)
MCHC: 33.9 g/dL (ref 32.0–36.0)
MCV: 99.6 fL (ref 80.0–100.0)
PLATELETS: 248 10*3/uL (ref 150–440)
RBC: 4.29 MIL/uL — AB (ref 4.40–5.90)
RDW: 13.7 % (ref 11.5–14.5)
WBC: 9.5 10*3/uL (ref 3.8–10.6)

## 2017-12-31 LAB — URINALYSIS, COMPLETE (UACMP) WITH MICROSCOPIC
BILIRUBIN URINE: NEGATIVE
Bacteria, UA: NONE SEEN
Cystine Crys, UA: POSITIVE
Glucose, UA: NEGATIVE mg/dL
Ketones, ur: 20 mg/dL — AB
LEUKOCYTES UA: NEGATIVE
Nitrite: NEGATIVE
Protein, ur: NEGATIVE mg/dL
SPECIFIC GRAVITY, URINE: 1.018 (ref 1.005–1.030)
pH: 5 (ref 5.0–8.0)

## 2017-12-31 LAB — COMPREHENSIVE METABOLIC PANEL
ALT: 16 U/L — ABNORMAL LOW (ref 17–63)
ANION GAP: 8 (ref 5–15)
AST: 22 U/L (ref 15–41)
Albumin: 4 g/dL (ref 3.5–5.0)
Alkaline Phosphatase: 83 U/L (ref 38–126)
BUN: 30 mg/dL — ABNORMAL HIGH (ref 6–20)
CHLORIDE: 110 mmol/L (ref 101–111)
CO2: 23 mmol/L (ref 22–32)
Calcium: 9.9 mg/dL (ref 8.9–10.3)
Creatinine, Ser: 1.51 mg/dL — ABNORMAL HIGH (ref 0.61–1.24)
GFR, EST AFRICAN AMERICAN: 53 mL/min — AB (ref >60)
GFR, EST NON AFRICAN AMERICAN: 46 mL/min — AB (ref >60)
Glucose, Bld: 125 mg/dL — ABNORMAL HIGH (ref 65–99)
POTASSIUM: 3.9 mmol/L (ref 3.5–5.1)
Sodium: 141 mmol/L (ref 135–145)
Total Bilirubin: 1.3 mg/dL — ABNORMAL HIGH (ref 0.3–1.2)
Total Protein: 8.2 g/dL — ABNORMAL HIGH (ref 6.5–8.1)

## 2017-12-31 LAB — LIPASE, BLOOD: LIPASE: 28 U/L (ref 11–51)

## 2017-12-31 LAB — TROPONIN I

## 2017-12-31 MED ORDER — SODIUM CHLORIDE 0.9 % IV BOLUS
1000.0000 mL | Freq: Once | INTRAVENOUS | Status: AC
  Administered 2018-01-01: 1000 mL via INTRAVENOUS

## 2017-12-31 MED ORDER — ONDANSETRON HCL 4 MG/2ML IJ SOLN
4.0000 mg | Freq: Once | INTRAMUSCULAR | Status: AC
  Administered 2018-01-01: 4 mg via INTRAVENOUS
  Filled 2017-12-31: qty 2

## 2017-12-31 NOTE — ED Triage Notes (Addendum)
Pt reports N/V with abd pain "going all the way up to my throat" since Sunday; pt reports that he believes it is from a medication change (ropineral increased from 1 to 2/nightly) for restless legs

## 2018-01-01 ENCOUNTER — Encounter: Payer: Self-pay | Admitting: Radiology

## 2018-01-01 ENCOUNTER — Emergency Department: Payer: Medicare PPO

## 2018-01-01 DIAGNOSIS — K5289 Other specified noninfective gastroenteritis and colitis: Secondary | ICD-10-CM | POA: Diagnosis not present

## 2018-01-01 LAB — TROPONIN I: Troponin I: <0.03 ng/mL (ref <0.03)

## 2018-01-01 MED ORDER — GI COCKTAIL ~~LOC~~
30.0000 mL | Freq: Once | ORAL | Status: AC
  Administered 2018-01-01: 30 mL via ORAL
  Filled 2018-01-01: qty 30

## 2018-01-01 MED ORDER — METOCLOPRAMIDE HCL 5 MG/ML IJ SOLN
10.0000 mg | Freq: Once | INTRAMUSCULAR | Status: AC
  Administered 2018-01-01: 10 mg via INTRAVENOUS
  Filled 2018-01-01: qty 2

## 2018-01-01 MED ORDER — FAMOTIDINE 40 MG PO TABS: 40.0000 mg | ORAL_TABLET | Freq: Every evening | ORAL | 0 refills | Status: DC

## 2018-01-01 MED ORDER — METOCLOPRAMIDE HCL 10 MG PO TABS: 10.0000 mg | ORAL_TABLET | Freq: Three times a day (TID) | ORAL | 0 refills | Status: DC | PRN

## 2018-01-01 MED ORDER — IOPAMIDOL (ISOVUE-370) INJECTION 76%
100.0000 mL | Freq: Once | INTRAVENOUS | Status: AC | PRN
  Administered 2018-01-01: 100 mL via INTRAVENOUS

## 2018-01-01 NOTE — ED Provider Notes (Signed)
[facility] Emergency Department Provider Note   ____________________________________________   First MD Initiated Contact with Patient 12/31/17 2322     (approximate)  I have reviewed the triage vital signs and the nursing notes.   HISTORY  Chief Complaint Abdominal Pain    HPI Matthew [name] is a 67 y.o. male who comes into the hospital today with some abdominal pain.  The patient states that he increase some medicine that he was on for restless leg syndrome.  He reports that on Sunday night he had some dinner and thought he might have choked.  He states that he coughed up some shaved beef.  He has continuously had these episodes of vomiting he states since then.  He has had some significant nausea and vomiting.  He is tried even taking nausea pills at home but it has not helped.  The patient has some epigastric pain that radiates up into his chest.  He rates his pain a 5-6 out of 10 in intensity.  He describes the pain as burning.  He states that he feels as though there is a knot in his chest and the burning from the constant vomiting.  He has been unable to take his medications at home.  He has been using a patch for pain.  The patient has had one episode of brown and watery diarrhea.  He is here today for evaluation and treatment of his symptoms.   Past Medical History:  Diagnosis Date  . Asthma   . Hip dislocation, bilateral ([place])   . Hypertension     There are no active problems to display for this patient.   Past Surgical History:  Procedure Laterality Date  . ABDOMINAL SURGERY    . CHOLECYSTECTOMY    . COLOSTOMY    . COLOSTOMY TAKEDOWN    . HERNIA REPAIR    . TOTAL HIP ARTHROPLASTY Bilateral     Prior to Admission medications   Medication Sig Start Date End Date Taking? Authorizing Provider  famotidine (PEPCID) 40 MG tablet Take 1 tablet (40 mg total) by mouth every evening. 01/01/18 01/01/19  Loney Hering, MD  metoCLOPramide  (REGLAN) 10 MG tablet Take 1 tablet (10 mg total) by mouth every 8 (eight) hours as needed. 01/01/18   Loney Hering, MD    Allergies Demerol [meperidine hcl]  No family history on file.  Social History Social History   Tobacco Use  . Smoking status: Never Smoker  . Smokeless tobacco: Never Used  Substance Use Topics  . Alcohol use: Not Currently  . Drug use: Not on file    Review of Systems  Constitutional: No fever/chills Eyes: No visual changes. ENT: No sore throat. Cardiovascular: chest pain. Respiratory: Denies shortness of breath. Gastrointestinal: abdominal pain. nausea,  vomiting. diarrhea.  No constipation. Genitourinary: Negative for dysuria. Musculoskeletal: Negative for back pain. Skin: Negative for rash. Neurological: Negative for headaches, focal weakness or numbness.   ____________________________________________   PHYSICAL EXAM:  VITAL SIGNS: ED Triage Vitals  Enc Vitals Group     BP 12/31/17 1909 (!) 174/106     Pulse Rate 12/31/17 1909 98     Resp 12/31/17 1909 20     Temp 12/31/17 1909 97.9 F (36.6 C)     Temp Source 12/31/17 1909 Oral     SpO2 12/31/17 1909 95 %     Weight 12/31/17 1910 260 lb (117.9 kg)     Height 12/31/17 1910 6\' 2"  (1.88 m)  Head Circumference --      Peak Flow --      Pain Score 12/31/17 1910 3     Pain Loc --      Pain Edu? --      Excl. in [place]? --     Constitutional: Alert and oriented. Well appearing and in moderate distress. Eyes: Conjunctivae are normal. PERRL. EOMI. Head: Atraumatic. Nose: No congestion/rhinnorhea. Mouth/Throat: Mucous membranes are moist.  Oropharynx non-erythematous. Cardiovascular: Normal rate, regular rhythm. Grossly normal heart sounds.  Good peripheral circulation. Respiratory: Normal respiratory effort.  No retractions. Lungs CTAB. Gastrointestinal: Soft with some mild epigastric tenderness to palpation. No distention.  Positive bowel sounds Musculoskeletal: No lower  extremity tenderness nor edema.   Neurologic:  Normal speech and language.  Skin:  Skin is warm, dry and intact.  Psychiatric: Mood and affect are normal.   ____________________________________________   LABS (all labs ordered are listed, but only abnormal results are displayed)  Labs Reviewed  COMPREHENSIVE METABOLIC PANEL - Abnormal; Notable for the following components:      Result Value   Glucose, Bld 125 (*)    BUN 30 (*)    Creatinine, Ser 1.51 (*)    Total Protein 8.2 (*)    ALT 16 (*)    Total Bilirubin 1.3 (*)    GFR calc non Af Amer 46 (*)    GFR calc Af Amer 53 (*)    All other components within normal limits  CBC - Abnormal; Notable for the following components:   RBC 4.29 (*)    All other components within normal limits  URINALYSIS, COMPLETE (UACMP) WITH MICROSCOPIC - Abnormal; Notable for the following components:   Color, Urine YELLOW (*)    APPearance HAZY (*)    Hgb urine dipstick SMALL (*)    Ketones, ur 20 (*)    All other components within normal limits  LIPASE, BLOOD  TROPONIN I  TROPONIN I   ____________________________________________  EKG  ED ECG REPORT I, Loney Hering, the attending physician, personally viewed and interpreted this ECG.   Date: 12/31/2017  EKG Time: 1918  Rate: 102  Rhythm: sinus tachycardia  Axis: normal  Intervals:none  ST&T Change: ST depressions in leads II, III, aVF, V5, V6.  ____________________________________________  RADIOLOGY  ED MD interpretation: Chest x-ray: No active cardiopulmonary disease, hyperinflation with bronchitic changes  Chest x-ray abdomen and pelvis: Negative for bowel obstruction or bowel wall thickening, diffuse fluid-filled colon, possible enteritis, negative appendix, mild thickening of the GE junction and distal esophagus possible reflux or esophagitis.  Cyst within bilateral kidneys.  Official radiology report(s): Dg Chest 2 View  Result Date: 01/01/2018 CLINICAL DATA:  Nausea  vomiting and abdominal pain EXAM: CHEST - 2 VIEW COMPARISON:  09/18/2014 FINDINGS: Hyperinflation with bronchitic changes. No acute consolidation or effusion. Normal heart size. No pneumothorax. IMPRESSION: No active cardiopulmonary disease. Hyperinflation with bronchitic changes Electronically Signed   By: Donavan Foil M.D.   On: 01/01/2018 00:30   Ct Abdomen Pelvis W Contrast  Result Date: 01/01/2018 CLINICAL DATA:  Nausea vomiting and abdominal pain EXAM: CT ABDOMEN AND PELVIS WITH CONTRAST TECHNIQUE: Multidetector CT imaging of the abdomen and pelvis was performed using the standard protocol following bolus administration of intravenous contrast. CONTRAST:  168mL ISOVUE-370 IOPAMIDOL (ISOVUE-370) INJECTION 76% COMPARISON:  CT chest 09/21/2012 FINDINGS: Lower chest: Stable 5 mm right middle lobe pulmonary nodule, benign. No acute consolidation or pleural effusion. Normal heart size. Hepatobiliary: No focal liver abnormality is seen.  Status post cholecystectomy. No biliary dilatation. Pancreas: Unremarkable. No pancreatic ductal dilatation or surrounding inflammatory changes. Spleen: Normal in size without focal abnormality. Adrenals/Urinary Tract: Adrenal glands are within normal limits. No hydronephrosis. Cysts within the kidneys. Additional subcentimeter hypodense renal lesions too small to further characterize. Scarring with calcification mid to upper left kidney. Bladder within normal limits Stomach/Bowel: Stomach is nonenlarged. Mild distal esophageal and GE junction thickening. Small duodenal lipoma. No dilated small bowel. Negative appendix. Diffuse liquid stools in the colon without colon wall thickening. Vascular/Lymphatic: Moderate aortic atherosclerosis. No aneurysm. No significantly enlarged lymph nodes Reproductive: Prostate is unremarkable. Other: Negative for free air or free fluid. Sutures within the anterior abdominal wall. Multiple metallic foreign bodies within the left paraspinal soft  tissues and subcutaneous fat of the left back and posterior gluteal region. Musculoskeletal: Status post bilateral hip replacements. No acute osseous abnormality. Degenerative changes of the spine. IMPRESSION: 1. Negative for bowel obstruction or bowel wall thickening. Diffuse fluid-filled colon, possible enteritis. Negative appendix. 2. Mild thickening of the GE junction and distal esophagus, possible reflux or esophagitis 3. Cysts within the bilateral kidneys Electronically Signed   By: Donavan Foil M.D.   On: 01/01/2018 01:57    ____________________________________________   PROCEDURES  Procedure(s) performed: None  Procedures  Critical Care performed: No  ____________________________________________   INITIAL IMPRESSION / ASSESSMENT AND PLAN / ED COURSE  As part of my medical decision making, I reviewed the following data within the electronic MEDICAL RECORD NUMBER Notes from prior ED visits and [place] Controlled Substance Database   This is a 67 year old male who comes into the hospital today with some vomiting and diarrhea.  The patient is having epigastric pain radiating into his chest.  My differential diagnosis includes acute coronary syndrome, gastritis, reflux, gastroenteritis  We did check some blood work on the patient and it was unremarkable.  The patient had a CT scan that showed a fluid-filled colon and some thickening of the GE junction likely with some gastroenteritis.  I gave the patient a GI cocktail and a liter of normal saline.  He received some IV Zofran and then some Reglan.  After the medication the patient felt better.  I did give him some ginger ale and he was able to drink without any vomit.  The patient will be discharged home and encouraged to follow-up with his primary care physician for further evaluation.  He did have some ST depressions which had not been seen on his 2016 EKG but his troponins were both negative and he was not having any further pain after a GI  cocktail.      ____________________________________________   FINAL CLINICAL IMPRESSION(S) / ED DIAGNOSES  Final diagnoses:  Epigastric pain  Gastroenteritis  Nausea vomiting and diarrhea     ED Discharge Orders        Ordered    metoCLOPramide (REGLAN) 10 MG tablet  Every 8 hours PRN     01/01/18 0328    famotidine (PEPCID) 40 MG tablet  Every evening     01/01/18 0328       Note:  This document was prepared using Dragon voice recognition software and may include unintentional dictation errors.    Loney Hering, MD 01/01/18 [phone]

## 2018-01-01 NOTE — Discharge Instructions (Addendum)
Please follow-up with your primary care physician for further evaluation of your symptoms.  Please ensure that you are able to hydrate at home.  Please return with any worsening condition or any other concerns.

## 2018-01-01 NOTE — ED Notes (Signed)
Pt given ginger ale for po challenge 

## 2018-01-01 NOTE — ED Notes (Signed)
Attempt x 2 for IV with no success.

## 2018-01-01 NOTE — ED Notes (Signed)
Pt tolerated oral fluids well but has had an episode of diarrhea. Dr Dahlia Client aware. Pt given bath wipes and paper scrubs to clean up for going home.

## 2018-02-07 DIAGNOSIS — G894 Chronic pain syndrome: Secondary | ICD-10-CM | POA: Diagnosis not present

## 2018-02-07 DIAGNOSIS — M79605 Pain in left leg: Secondary | ICD-10-CM | POA: Diagnosis not present

## 2018-02-07 DIAGNOSIS — M545 Low back pain: Secondary | ICD-10-CM | POA: Diagnosis not present

## 2018-02-07 DIAGNOSIS — G4700 Insomnia, unspecified: Secondary | ICD-10-CM | POA: Diagnosis not present

## 2018-02-07 DIAGNOSIS — M5417 Radiculopathy, lumbosacral region: Secondary | ICD-10-CM | POA: Diagnosis not present

## 2018-02-07 DIAGNOSIS — Z79891 Long term (current) use of opiate analgesic: Secondary | ICD-10-CM | POA: Diagnosis not present

## 2018-02-07 DIAGNOSIS — M1712 Unilateral primary osteoarthritis, left knee: Secondary | ICD-10-CM | POA: Diagnosis not present

## 2018-02-07 DIAGNOSIS — M1711 Unilateral primary osteoarthritis, right knee: Secondary | ICD-10-CM | POA: Diagnosis not present

## 2018-02-07 DIAGNOSIS — Z79899 Other long term (current) drug therapy: Secondary | ICD-10-CM | POA: Diagnosis not present

## 2018-02-07 DIAGNOSIS — G5600 Carpal tunnel syndrome, unspecified upper limb: Secondary | ICD-10-CM | POA: Diagnosis not present

## 2018-04-30 DIAGNOSIS — G4700 Insomnia, unspecified: Secondary | ICD-10-CM | POA: Diagnosis not present

## 2018-04-30 DIAGNOSIS — M79605 Pain in left leg: Secondary | ICD-10-CM | POA: Diagnosis not present

## 2018-04-30 DIAGNOSIS — M545 Low back pain: Secondary | ICD-10-CM | POA: Diagnosis not present

## 2018-04-30 DIAGNOSIS — M1712 Unilateral primary osteoarthritis, left knee: Secondary | ICD-10-CM | POA: Diagnosis not present

## 2018-04-30 DIAGNOSIS — G894 Chronic pain syndrome: Secondary | ICD-10-CM | POA: Diagnosis not present

## 2018-04-30 DIAGNOSIS — M1711 Unilateral primary osteoarthritis, right knee: Secondary | ICD-10-CM | POA: Diagnosis not present

## 2018-04-30 DIAGNOSIS — G5600 Carpal tunnel syndrome, unspecified upper limb: Secondary | ICD-10-CM | POA: Diagnosis not present

## 2018-04-30 DIAGNOSIS — M5417 Radiculopathy, lumbosacral region: Secondary | ICD-10-CM | POA: Diagnosis not present

## 2018-04-30 DIAGNOSIS — Z79899 Other long term (current) drug therapy: Secondary | ICD-10-CM | POA: Diagnosis not present

## 2018-06-17 DIAGNOSIS — G479 Sleep disorder, unspecified: Secondary | ICD-10-CM | POA: Diagnosis not present

## 2018-06-17 DIAGNOSIS — I119 Hypertensive heart disease without heart failure: Secondary | ICD-10-CM | POA: Diagnosis not present

## 2018-06-17 DIAGNOSIS — E8881 Metabolic syndrome: Secondary | ICD-10-CM | POA: Diagnosis not present

## 2018-06-17 DIAGNOSIS — E785 Hyperlipidemia, unspecified: Secondary | ICD-10-CM | POA: Diagnosis not present

## 2018-07-30 DIAGNOSIS — M25551 Pain in right hip: Secondary | ICD-10-CM | POA: Diagnosis not present

## 2018-09-03 DIAGNOSIS — M79605 Pain in left leg: Secondary | ICD-10-CM | POA: Diagnosis not present

## 2018-09-03 DIAGNOSIS — G894 Chronic pain syndrome: Secondary | ICD-10-CM | POA: Diagnosis not present

## 2018-09-03 DIAGNOSIS — M25562 Pain in left knee: Secondary | ICD-10-CM | POA: Diagnosis not present

## 2018-09-03 DIAGNOSIS — G5600 Carpal tunnel syndrome, unspecified upper limb: Secondary | ICD-10-CM | POA: Diagnosis not present

## 2018-09-03 DIAGNOSIS — Z79899 Other long term (current) drug therapy: Secondary | ICD-10-CM | POA: Diagnosis not present

## 2018-09-03 DIAGNOSIS — G4700 Insomnia, unspecified: Secondary | ICD-10-CM | POA: Diagnosis not present

## 2018-09-03 DIAGNOSIS — M545 Low back pain: Secondary | ICD-10-CM | POA: Diagnosis not present

## 2018-09-03 DIAGNOSIS — M170 Bilateral primary osteoarthritis of knee: Secondary | ICD-10-CM | POA: Diagnosis not present

## 2018-09-03 DIAGNOSIS — M5417 Radiculopathy, lumbosacral region: Secondary | ICD-10-CM | POA: Diagnosis not present

## 2018-10-15 ENCOUNTER — Other Ambulatory Visit: Payer: Self-pay

## 2018-10-15 ENCOUNTER — Emergency Department: Payer: Medicare PPO

## 2018-10-15 ENCOUNTER — Inpatient Hospital Stay
Admission: EM | Admit: 2018-10-15 | Discharge: 2018-10-18 | DRG: 287 | Disposition: A | Discharge status: Home / Self Care | Payer: Medicare PPO | Attending: Internal Medicine | Admitting: Internal Medicine

## 2018-10-15 DIAGNOSIS — F329 Major depressive disorder, single episode, unspecified: Secondary | ICD-10-CM | POA: Diagnosis not present

## 2018-10-15 DIAGNOSIS — Z825 Family history of asthma and other chronic lower respiratory diseases: Secondary | ICD-10-CM

## 2018-10-15 DIAGNOSIS — R0989 Other specified symptoms and signs involving the circulatory and respiratory systems: Secondary | ICD-10-CM | POA: Diagnosis not present

## 2018-10-15 DIAGNOSIS — R778 Other specified abnormalities of plasma proteins: Secondary | ICD-10-CM

## 2018-10-15 DIAGNOSIS — I248 Other forms of acute ischemic heart disease: Principal | ICD-10-CM | POA: Diagnosis present

## 2018-10-15 DIAGNOSIS — G8929 Other chronic pain: Secondary | ICD-10-CM | POA: Diagnosis not present

## 2018-10-15 DIAGNOSIS — J4541 Moderate persistent asthma with (acute) exacerbation: Secondary | ICD-10-CM | POA: Diagnosis present

## 2018-10-15 DIAGNOSIS — R0600 Dyspnea, unspecified: Secondary | ICD-10-CM | POA: Diagnosis not present

## 2018-10-15 DIAGNOSIS — K219 Gastro-esophageal reflux disease without esophagitis: Secondary | ICD-10-CM | POA: Diagnosis present

## 2018-10-15 DIAGNOSIS — Z79899 Other long term (current) drug therapy: Secondary | ICD-10-CM | POA: Diagnosis not present

## 2018-10-15 DIAGNOSIS — E669 Obesity, unspecified: Secondary | ICD-10-CM | POA: Diagnosis present

## 2018-10-15 DIAGNOSIS — Z9119 Patient's noncompliance with other medical treatment and regimen: Secondary | ICD-10-CM

## 2018-10-15 DIAGNOSIS — J449 Chronic obstructive pulmonary disease, unspecified: Secondary | ICD-10-CM | POA: Diagnosis not present

## 2018-10-15 DIAGNOSIS — Z885 Allergy status to narcotic agent status: Secondary | ICD-10-CM | POA: Diagnosis not present

## 2018-10-15 DIAGNOSIS — Z9049 Acquired absence of other specified parts of digestive tract: Secondary | ICD-10-CM

## 2018-10-15 DIAGNOSIS — N183 Chronic kidney disease, stage 3 (moderate): Secondary | ICD-10-CM | POA: Diagnosis not present

## 2018-10-15 DIAGNOSIS — R0602 Shortness of breath: Secondary | ICD-10-CM | POA: Diagnosis not present

## 2018-10-15 DIAGNOSIS — R7989 Other specified abnormal findings of blood chemistry: Secondary | ICD-10-CM

## 2018-10-15 DIAGNOSIS — I10 Essential (primary) hypertension: Secondary | ICD-10-CM | POA: Diagnosis not present

## 2018-10-15 DIAGNOSIS — Z808 Family history of malignant neoplasm of other organs or systems: Secondary | ICD-10-CM

## 2018-10-15 DIAGNOSIS — I209 Angina pectoris, unspecified: Secondary | ICD-10-CM | POA: Diagnosis not present

## 2018-10-15 DIAGNOSIS — I129 Hypertensive chronic kidney disease with stage 1 through stage 4 chronic kidney disease, or unspecified chronic kidney disease: Secondary | ICD-10-CM | POA: Diagnosis not present

## 2018-10-15 DIAGNOSIS — J45901 Unspecified asthma with (acute) exacerbation: Secondary | ICD-10-CM | POA: Diagnosis present

## 2018-10-15 DIAGNOSIS — R600 Localized edema: Secondary | ICD-10-CM | POA: Diagnosis not present

## 2018-10-15 DIAGNOSIS — Z96643 Presence of artificial hip joint, bilateral: Secondary | ICD-10-CM | POA: Diagnosis present

## 2018-10-15 DIAGNOSIS — Z6836 Body mass index (BMI) 36.0-36.9, adult: Secondary | ICD-10-CM | POA: Diagnosis not present

## 2018-10-15 HISTORY — DX: Edema, unspecified: R60.9

## 2018-10-15 LAB — CBC
HEMATOCRIT: 43.6 % (ref 39.0–52.0)
Hemoglobin: 14.2 g/dL (ref 13.0–17.0)
MCH: 32.6 pg (ref 26.0–34.0)
MCHC: 32.6 g/dL (ref 30.0–36.0)
MCV: 100 fL (ref 80.0–100.0)
NRBC: 0 % (ref 0.0–0.2)
PLATELETS: 191 10*3/uL (ref 150–400)
RBC: 4.36 MIL/uL (ref 4.22–5.81)
RDW: 12.6 % (ref 11.5–15.5)
WBC: 6.7 10*3/uL (ref 4.0–10.5)

## 2018-10-15 LAB — COMPREHENSIVE METABOLIC PANEL
ALK PHOS: 93 U/L (ref 38–126)
ALT: 15 U/L (ref 0–44)
AST: 30 U/L (ref 15–41)
Albumin: 3.8 g/dL (ref 3.5–5.0)
Anion gap: 8 (ref 5–15)
BILIRUBIN TOTAL: 1.6 mg/dL — AB (ref 0.3–1.2)
BUN: 20 mg/dL (ref 8–23)
CALCIUM: 9.2 mg/dL (ref 8.9–10.3)
CO2: 23 mmol/L (ref 22–32)
Chloride: 103 mmol/L (ref 98–111)
Creatinine, Ser: 1.45 mg/dL — ABNORMAL HIGH (ref 0.61–1.24)
GFR calc Af Amer: 57 mL/min — ABNORMAL LOW (ref >60)
GFR, EST NON AFRICAN AMERICAN: 49 mL/min — AB (ref >60)
Glucose, Bld: 140 mg/dL — ABNORMAL HIGH (ref 70–99)
POTASSIUM: 4.5 mmol/L (ref 3.5–5.1)
Sodium: 134 mmol/L — ABNORMAL LOW (ref 135–145)
TOTAL PROTEIN: 7.7 g/dL (ref 6.5–8.1)

## 2018-10-15 LAB — HEPARIN LEVEL (UNFRACTIONATED): HEPARIN UNFRACTIONATED: 0.43 [IU]/mL (ref 0.30–0.70)

## 2018-10-15 LAB — INFLUENZA PANEL BY PCR (TYPE A & B)
Influenza A By PCR: NEGATIVE
Influenza B By PCR: NEGATIVE

## 2018-10-15 LAB — APTT: aPTT: 34 seconds (ref 24–36)

## 2018-10-15 LAB — TROPONIN I
Troponin I: 2.49 ng/mL (ref <0.03)
Troponin I: 1.51 ng/mL (ref <0.03)

## 2018-10-15 LAB — PROTIME-INR
INR: 1 (ref 0.8–1.2)
Prothrombin Time: 13.3 seconds (ref 11.4–15.2)

## 2018-10-15 MED ORDER — ACETAMINOPHEN 650 MG RE SUPP
650.0000 mg | Freq: Four times a day (QID) | RECTAL | Status: DC | PRN
  Filled 2018-10-15: qty 1

## 2018-10-15 MED ORDER — MAGNESIUM SULFATE 2 GM/50ML IV SOLN
2.0000 g | Freq: Once | INTRAVENOUS | Status: AC
  Administered 2018-10-15: 2 g via INTRAVENOUS
  Filled 2018-10-15: qty 50

## 2018-10-15 MED ORDER — IOHEXOL 350 MG/ML SOLN
75.0000 mL | Freq: Once | INTRAVENOUS | Status: AC | PRN
  Administered 2018-10-15: 75 mL via INTRAVENOUS

## 2018-10-15 MED ORDER — LISINOPRIL 5 MG PO TABS
5.0000 mg | ORAL_TABLET | Freq: Every day | ORAL | Status: DC
  Administered 2018-10-16 – 2018-10-18 (×3): 5 mg via ORAL
  Filled 2018-10-15 (×3): qty 1

## 2018-10-15 MED ORDER — BUDESONIDE 0.5 MG/2ML IN SUSP
0.5000 mg | Freq: Two times a day (BID) | RESPIRATORY_TRACT | Status: DC
  Administered 2018-10-15 – 2018-10-18 (×5): 0.5 mg via RESPIRATORY_TRACT
  Filled 2018-10-15 (×8): qty 2

## 2018-10-15 MED ORDER — METHYLPREDNISOLONE SODIUM SUCC 125 MG IJ SOLR
125.0000 mg | Freq: Once | INTRAMUSCULAR | Status: AC
  Administered 2018-10-15: 125 mg via INTRAVENOUS
  Filled 2018-10-15: qty 2

## 2018-10-15 MED ORDER — HEPARIN BOLUS VIA INFUSION
4000.0000 [IU] | Freq: Once | INTRAVENOUS | Status: AC
  Administered 2018-10-15: 4000 [IU] via INTRAVENOUS
  Filled 2018-10-15: qty 4000

## 2018-10-15 MED ORDER — ASPIRIN EC 81 MG PO TBEC
81.0000 mg | DELAYED_RELEASE_TABLET | Freq: Every day | ORAL | Status: DC
  Administered 2018-10-16 – 2018-10-18 (×2): 81 mg via ORAL
  Filled 2018-10-15 (×3): qty 1

## 2018-10-15 MED ORDER — METHYLPREDNISOLONE SODIUM SUCC 40 MG IJ SOLR
40.0000 mg | Freq: Every day | INTRAMUSCULAR | Status: DC
  Administered 2018-10-16 – 2018-10-18 (×3): 40 mg via INTRAVENOUS
  Filled 2018-10-15 (×4): qty 1

## 2018-10-15 MED ORDER — IPRATROPIUM-ALBUTEROL 0.5-2.5 (3) MG/3ML IN SOLN
3.0000 mL | Freq: Once | RESPIRATORY_TRACT | Status: AC
  Administered 2018-10-15: 3 mL via RESPIRATORY_TRACT
  Filled 2018-10-15: qty 3

## 2018-10-15 MED ORDER — HEPARIN (PORCINE) 25000 UT/250ML-% IV SOLN
1350.0000 [IU]/h | INTRAVENOUS | Status: DC
  Administered 2018-10-15 – 2018-10-17 (×3): 1350 [IU]/h via INTRAVENOUS
  Filled 2018-10-15 (×5): qty 250

## 2018-10-15 MED ORDER — IPRATROPIUM-ALBUTEROL 0.5-2.5 (3) MG/3ML IN SOLN
3.0000 mL | Freq: Four times a day (QID) | RESPIRATORY_TRACT | Status: DC
  Administered 2018-10-15 – 2018-10-18 (×12): 3 mL via RESPIRATORY_TRACT
  Filled 2018-10-15 (×12): qty 3

## 2018-10-15 MED ORDER — DULOXETINE HCL 30 MG PO CPEP
60.0000 mg | ORAL_CAPSULE | Freq: Two times a day (BID) | ORAL | Status: DC
  Administered 2018-10-15 – 2018-10-18 (×6): 60 mg via ORAL
  Filled 2018-10-15: qty 2
  Filled 2018-10-15: qty 1
  Filled 2018-10-15: qty 2
  Filled 2018-10-15: qty 1
  Filled 2018-10-15: qty 2
  Filled 2018-10-15: qty 1
  Filled 2018-10-15: qty 2

## 2018-10-15 MED ORDER — ACETAMINOPHEN 325 MG PO TABS
650.0000 mg | ORAL_TABLET | Freq: Four times a day (QID) | ORAL | Status: DC | PRN
  Filled 2018-10-15: qty 2

## 2018-10-15 MED ORDER — ALBUTEROL SULFATE (2.5 MG/3ML) 0.083% IN NEBU
INHALATION_SOLUTION | RESPIRATORY_TRACT | Status: AC
  Administered 2018-10-15: 5 mg via RESPIRATORY_TRACT
  Filled 2018-10-15: qty 6

## 2018-10-15 MED ORDER — ALBUTEROL SULFATE (2.5 MG/3ML) 0.083% IN NEBU
5.0000 mg | INHALATION_SOLUTION | Freq: Once | RESPIRATORY_TRACT | Status: AC
  Administered 2018-10-15: 5 mg via RESPIRATORY_TRACT

## 2018-10-15 MED ORDER — SODIUM CHLORIDE 0.9 % IV SOLN
INTRAVENOUS | Status: DC
  Administered 2018-10-15 – 2018-10-16 (×3): via INTRAVENOUS

## 2018-10-15 MED ORDER — ASPIRIN 81 MG PO CHEW
324.0000 mg | CHEWABLE_TABLET | Freq: Once | ORAL | Status: AC
  Administered 2018-10-15: 324 mg via ORAL
  Filled 2018-10-15: qty 4

## 2018-10-15 NOTE — ED Triage Notes (Signed)
Sob and cough/congestion since Sunday. Productive clear sputum. Audible wheezing.

## 2018-10-15 NOTE — ED Notes (Signed)
Patient transported to X-ray 

## 2018-10-15 NOTE — Progress Notes (Signed)
ANTICOAGULATION CONSULT NOTE - Initial Consult  Pharmacy Consult for heparin Indication: chest pain/ACS  Allergies  Allergen Reactions  . Demerol [Meperidine Hcl]     Patient Measurements: Height: 6\' 2"  (188 cm) Weight: 265 lb (120.2 kg) IBW/kg (Calculated) : 82.2 Heparin Dosing Weight: 107.99 kg   Vital Signs: Temp: 99.7 F (37.6 C) (03/03 1444) Temp Source: Oral (03/03 1444) BP: 153/94 (03/03 1518) Pulse Rate: 105 (03/03 1518)  Labs: Recent Labs    10/15/18 1452  HGB 14.2  HCT 43.6  PLT 191  CREATININE 1.45*  TROPONINI 2.49*    Estimated Creatinine Clearance: 67.2 mL/min (A) (by C-G formula based on SCr of 1.45 mg/dL (H)).   Medical History: Past Medical History:  Diagnosis Date  . Asthma   . Hip dislocation, bilateral ([place])   . Hypertension     Medications:  (Not in a hospital admission)  Scheduled:  . aspirin  324 mg Oral Once   Infusions:   PRN:   Assessment: Pharmacy was consulted to start heparin. No note of DOAC PTA.   Goal of Therapy:  Heparin level 0.3-0.7 units/ml Monitor platelets by anticoagulation protocol: Yes   Plan:  Give 4000 units bolus x 1 Start heparin infusion at 1350 units/hr Check anti-Xa level in 6 hours and daily while on heparin Continue to monitor H&H and platelets  Oswald Hillock, PharmD, BCPS Clinical Pharmacist  10/15/2018,4:24 PM

## 2018-10-15 NOTE — ED Notes (Signed)
primedoc in with pt for admission.  

## 2018-10-15 NOTE — ED Notes (Signed)
meds given

## 2018-10-15 NOTE — ED Notes (Signed)
Pt continues to wait on admission bed.  nsr on monitor.  Iv in place.

## 2018-10-15 NOTE — ED Notes (Signed)
ED TO INPATIENT HANDOFF REPORT  ED Nurse Name and Phone #: Alaila Pillard [phone]  S Name/Age/Gender Artis Delay 68 y.o. male Room/Bed: ED03A/ED03A  Code Status   Code Status: Full Code  Home/SNF/Other Home Patient oriented to: self, place, time and situation Is this baseline? Yes   Triage Complete: Triage complete  Chief Complaint asthma  Triage Note Sob and cough/congestion since Sunday. Productive clear sputum. Audible wheezing.   Allergies Allergies  Allergen Reactions  . Demerol [Meperidine Hcl]     Level of Care/Admitting Diagnosis ED Disposition    ED Disposition Condition [facility] Area: [place] [100120]  Level of Care: Telemetry [5]  Diagnosis: Acute asthma exacerbation [427062]  Admitting Physician: Loletha Grayer [[id number]]  Attending Physician: Loletha Grayer [[id number]]  Estimated length of stay: past midnight tomorrow  Certification:: I certify this patient will need inpatient services for at least 2 midnights  PT Class (Do Not Modify): Inpatient [101]  PT Acc Code (Do Not Modify): Private [1]       B Medical/Surgery History Past Medical History:  Diagnosis Date  . Asthma   . Edema   . Hip dislocation, bilateral ([place])   . Hypertension    Past Surgical History:  Procedure Laterality Date  . ABDOMINAL SURGERY    . CHOLECYSTECTOMY    . COLOSTOMY    . COLOSTOMY TAKEDOWN    . HERNIA REPAIR    . TOTAL HIP ARTHROPLASTY Bilateral      A IV Location/Drains/Wounds Patient Lines/Drains/Airways Status   Active Line/Drains/Airways    Name:   Placement date:   Placement time:   Site:   Days:   Peripheral IV 01/01/18 Right Antecubital   01/01/18    0049    Antecubital   287   Peripheral IV 10/15/18 Left Antecubital   10/15/18    1449    Antecubital   less than 1   Peripheral IV 10/15/18 Right Antecubital   10/15/18    1733    Antecubital   less than 1          Intake/Output Last 24 hours No intake or output  data in the 24 hours ending 10/15/18 2355  Labs/Imaging Results for orders placed or performed during the hospital encounter of 10/15/18 (from the past 48 hour(s))  CBC     Status: None   Collection Time: 10/15/18  2:52 PM  Result Value Ref Range   WBC 6.7 4.0 - 10.5 K/uL   RBC 4.36 4.22 - 5.81 MIL/uL   Hemoglobin 14.2 13.0 - 17.0 g/dL   HCT 43.6 39.0 - 52.0 %   MCV 100.0 80.0 - 100.0 fL   MCH 32.6 26.0 - 34.0 pg   MCHC 32.6 30.0 - 36.0 g/dL   RDW 12.6 11.5 - 15.5 %   Platelets 191 150 - 400 K/uL   nRBC 0.0 0.0 - 0.2 %    Comment: Performed at [facility] [place], [address] [place], [place] [address]  Troponin I - ONCE - STAT     Status: Abnormal   Collection Time: 10/15/18  2:52 PM  Result Value Ref Range   Troponin I 2.49 (HH) <0.03 ng/mL    Comment: CRITICAL RESULT CALLED TO, READ BACK BY AND VERIFIED WITH Akshath Mccarey @ 1062 ON 10/15/18 BY JUW Performed at [facility], [address] [place], [place] [address]   Comprehensive metabolic panel     Status: Abnormal   Collection Time: 10/15/18  2:52 PM  Result Value Ref Range   Sodium 134 (L) 135 - 145 mmol/L   Potassium 4.5 3.5 - 5.1 mmol/L   Chloride 103 98 - 111 mmol/L   CO2 23 22 - 32 mmol/L   Glucose, Bld 140 (H) 70 - 99 mg/dL   BUN 20 8 - 23 mg/dL   Creatinine, Ser 1.45 (H) 0.61 - 1.24 mg/dL   Calcium 9.2 8.9 - 10.3 mg/dL   Total Protein 7.7 6.5 - 8.1 g/dL   Albumin 3.8 3.5 - 5.0 g/dL   AST 30 15 - 41 U/L   ALT 15 0 - 44 U/L   Alkaline Phosphatase 93 38 - 126 U/L   Total Bilirubin 1.6 (H) 0.3 - 1.2 mg/dL   GFR calc non Af Amer 49 (L) >60 mL/min   GFR calc Af Amer 57 (L) >60 mL/min   Anion gap 8 5 - 15    Comment: Performed at [facility], [address] [place], [place] [address]  APTT     Status: None   Collection Time: 10/15/18  5:14 PM  Result Value Ref Range   aPTT 34 24 - 36 seconds    Comment: Performed at [facility], [address] [place], [place] [address]   Protime-INR     Status: None   Collection Time: 10/15/18  5:14 PM  Result Value Ref Range   Prothrombin Time 13.3 11.4 - 15.2 seconds   INR 1.0 0.8 - 1.2    Comment: (NOTE) INR goal varies based on device and disease states. Performed at [facility], [address] [place], [place] [address]   Influenza panel by PCR (type A & B)     Status: None   Collection Time: 10/15/18  5:14 PM  Result Value Ref Range   Influenza A By PCR NEGATIVE NEGATIVE   Influenza B By PCR NEGATIVE NEGATIVE    Comment: (NOTE) The Xpert Xpress Flu assay is intended as an aid in the diagnosis of  influenza and should not be used as a sole basis for treatment.  This  assay is FDA approved for nasopharyngeal swab specimens only. Nasal  washings and aspirates are unacceptable for Xpert Xpress Flu testing. Performed at [facility], [place], [place] [address]   Heparin level (unfractionated)     Status: None   Collection Time: 10/15/18 10:30 PM  Result Value Ref Range   Heparin Unfractionated 0.43 0.30 - 0.70 IU/mL    Comment: (NOTE) If heparin results are below expected values, and patient dosage has  been confirmed, suggest follow up testing of antithrombin III levels. Performed at [facility], [address] [place], [place] [address]   Troponin I - Once     Status: Abnormal   Collection Time: 10/15/18 10:30 PM  Result Value Ref Range   Troponin I 1.51 (HH) <0.03 ng/mL    Comment: CRITICAL VALUE NOTED. VALUE IS CONSISTENT WITH PREVIOUSLY REPORTED/CALLED VALUE / [facility] Performed at [facility], [address] [place], [place] [address]    Dg Chest 2 View  Result Date: 10/15/2018 CLINICAL DATA:  Dyspnea and cough since Sunday. EXAM: CHEST - 2 VIEW COMPARISON:  12/31/2017 FINDINGS: Stable normal heart size and mediastinal contours. Slight pulmonary hyperinflation consistent with COPD with minimal bibasilar atelectasis, left greater than right. No  acute pulmonary consolidation nor overt pulmonary edema. 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 Stable degenerative change along the thoracic spine. IMPRESSION:  Pulmonary hyperinflation, chronic in appearance in compatible with COPD with minimal bibasilar atelectasis. No active pulmonary disease. Electronically Signed   By: Ashley Royalty M.D.   On: 10/15/2018 15:06   Ct Angio Chest Pe W And/or Wo Contrast  Result Date: 10/15/2018 CLINICAL DATA:  Dyspnea, cough and congestion since Sunday. EXAM: CT ANGIOGRAPHY CHEST WITH CONTRAST TECHNIQUE: Multidetector CT imaging of the chest was performed using the standard protocol during bolus administration of intravenous contrast. Multiplanar CT image reconstructions and MIPs were obtained to evaluate the vascular anatomy. CONTRAST:  75mL OMNIPAQUE IOHEXOL 350 MG/ML SOLN COMPARISON:  CXR 10/15/2018, chest CT 09/21/2012 FINDINGS: Cardiovascular: Satisfactory opacification of the pulmonary arteries to the segmental level. No evidence of pulmonary embolism. Normal heart size. No pericardial effusion coronary arteriosclerosis is noted of the LAD and left circumflex. Conventional branch pattern of the great vessels with atherosclerosis along the proximal right brachiocephalic. Nonaneurysmal, minimally atherosclerotic thoracic aorta without dissection. Mediastinum/Nodes: No enlarged mediastinal, hilar, or axillary lymph nodes. Thyroid gland, trachea, and esophagus demonstrate no significant findings. Lungs/Pleura: 4 mm nodular density along the left major fissure is more often related to perifissural lymph nodes. Subpleural focus of ground-glass opacity in the left lower lobe, series 6/60 9 May reflect a focus of minimal atelectasis or inflammation, measuring 11 x 6 mm. No effusion or pneumothorax. A right lower lobe pulmonary bleb is identified. Upper Abdomen: No acute abnormality. 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 No acute nor suspicious osseous lesions. Review of the MIP images confirms the above findings. IMPRESSION: 1. No acute pulmonary embolus. 2. 4 mm perifissural lymph node on the left. Faint ground-glass opacity in the left lower lobe measuring 11 x 6 mm is noted more likely to represent postinfectious or inflammatory change versus atelectasis. Initial follow-up with CT at 6-12 months is recommended to confirm persistence. If persistent, repeat CT is recommended every 2 years until 5 years of stability has been established. This recommendation follows the consensus statement: Guidelines for Management of Incidental Pulmonary Nodules Detected on CT Images: From the Fleischner Society 2017; Radiology 2017; 284:228-243. Aortic Atherosclerosis (ICD10-I70.0). Electronically Signed   By: David  Kwon M.D.   On: 10/15/2018 16:19    Pending Labs Unresulted Labs (From admission, onward)    Start     Ordered   10/16/18 0500  Lipid panel  Tomorrow morning,   STAT     10/15/18 1703   10/16/18 0500  Basic metabolic panel  Tomorrow morning,   STAT     10/15/18 1704   10/16/18 0500  CBC  Tomorrow morning,   STAT     10/15/18 1704   10/16/18 0500  HIV Antibody (routine testing w rflx)  Once,   R     10/16/18 0500   10/16/18 0500  Troponin I -  Once,   R     10/16/18 0500   10/16/18 0500  Heparin level (unfractionated)  Tomorrow morning,   STAT     10/15/18 2322   10/15/18 1656  Respiratory Panel by PCR  (Respiratory virus panel with precautions)  Once,   STAT     03 /03/20 1655          Vitals/Pain Today's Vitals   10/15/18 2000 10/15/18 2030 10/15/18 2230 10/15/18 2300  BP: (!) 165/94 (!) 158/98 (!) 150/100 (!) 157/80  Pulse: 92  71 73  Resp: 18 19 19 20   Temp:      TempSrc:      SpO2: 95%  93% 95%  Weight:      Height:      PainSc:        Isolation Precautions Droplet precaution  Medications Medications  heparin ADULT infusion 100 units/mL (25000 units/238mL  sodium chloride 0.45%) (1,350 Units/hr Intravenous New Bag/Given 10/15/18 1710)  ipratropium-albuterol (DUONEB) 0.5-2.5 (3) MG/3ML nebulizer solution 3 mL (3 mLs Nebulization Given 10/15/18 2043)  budesonide (PULMICORT) nebulizer solution 0.5 mg (0.5 mg Nebulization Given 10/15/18 2102)  aspirin EC tablet 81 mg (has no administration in time range)  acetaminophen (TYLENOL) tablet 650 mg (has no administration in time range)    Or  acetaminophen (TYLENOL) suppository 650 mg (has no administration in time range)  lisinopril (PRINIVIL,ZESTRIL) tablet 5 mg (has no administration in time range)  DULoxetine (CYMBALTA) DR capsule 60 mg (60 mg Oral Given 10/15/18 2233)  methylPREDNISolone sodium succinate (SOLU-MEDROL) 40 mg/mL injection 40 mg (has no administration in time range)  0.9 %  sodium chloride infusion ( Intravenous New Bag/Given 10/15/18 2043)  albuterol (PROVENTIL) (2.5 MG/3ML) 0.083% nebulizer solution 5 mg (5 mg Nebulization Given 10/15/18 1503)  ipratropium-albuterol (DUONEB) 0.5-2.5 (3) MG/3ML nebulizer solution 3 mL (3 mLs Nebulization Given 10/15/18 1535)  ipratropium-albuterol (DUONEB) 0.5-2.5 (3) MG/3ML nebulizer solution 3 mL (3 mLs Nebulization Given 10/15/18 1535)  methylPREDNISolone sodium succinate (SOLU-MEDROL) 125 mg/2 mL injection 125 mg (125 mg Intravenous Given 10/15/18 1535)  aspirin chewable tablet 324 mg (324 mg Oral Given 10/15/18 1703)  iohexol (OMNIPAQUE) 350 MG/ML injection 75 mL (75 mLs Intravenous Contrast Given 10/15/18 1606)  heparin bolus via infusion 4,000 Units (4,000 Units Intravenous Bolus from Bag 10/15/18 1709)  magnesium sulfate IVPB 2 g 50 mL (0 g Intravenous Stopped 10/15/18 2023)    Mobility walks Low fall risk   Focused Assessments Cardiac Assessment Handoff:  Cardiac Rhythm: Sinus tachycardia Lab Results  Component Value Date   CKTOTAL 51 09/21/2012   CKMB < 0.5 (L) 09/22/2012   TROPONINI 1.51 (HH) 10/15/2018   No results found for: DDIMER Does the Patient  currently have chest pain? No     R Recommendations: See Admitting Provider Note  Report given to:   Additional Notes:

## 2018-10-15 NOTE — ED Notes (Signed)
Dr Clayborn Bigness in with pt now

## 2018-10-15 NOTE — ED Provider Notes (Signed)
[facility] Emergency Department Provider Note    ____________________________________________   I have reviewed the triage vital signs and the nursing notes.   HISTORY  Chief Complaint Shortness of Breath and Cough   History limited by: Not Limited   HPI Matthew [name] is a 68 y.o. male who presents to the emergency department today because of concern for shortness of breath. Symptoms started two days ago. Have been constant and gradually worsening. Initially thought he might have had a cold but now thinks its an asthma exacerbation. Has been accompanied by chest discomfort and productive cough. Denies any fevers. Has been using his albuterol inhaler at home with minimal relief.    Per medical record review patient has a history of asthma  Past Medical History:  Diagnosis Date  . Asthma   . Hip dislocation, bilateral ([place])   . Hypertension     There are no active problems to display for this patient.   Past Surgical History:  Procedure Laterality Date  . ABDOMINAL SURGERY    . CHOLECYSTECTOMY    . COLOSTOMY    . COLOSTOMY TAKEDOWN    . HERNIA REPAIR    . TOTAL HIP ARTHROPLASTY Bilateral     Prior to Admission medications   Medication Sig Start Date End Date Taking? Authorizing Provider  famotidine (PEPCID) 40 MG tablet Take 1 tablet (40 mg total) by mouth every evening. 01/01/18 01/01/19  Loney Hering, MD  metoCLOPramide (REGLAN) 10 MG tablet Take 1 tablet (10 mg total) by mouth every 8 (eight) hours as needed. 01/01/18   Loney Hering, MD    Allergies Demerol [meperidine hcl]  No family history on file.  Social History Social History   Tobacco Use  . Smoking status: Never Smoker  . Smokeless tobacco: Never Used  Substance Use Topics  . Alcohol use: Not Currently  . Drug use: Not on file    Review of Systems Constitutional: No fever/chills Eyes: No visual changes. ENT: No sore throat. Cardiovascular: Positive for  chest pain. Respiratory: Positive for shortness of breath. Gastrointestinal: No abdominal pain.  No nausea, no vomiting.  No diarrhea.   Genitourinary: Negative for dysuria. Musculoskeletal: Negative for back pain. Skin: Negative for rash. Neurological: Negative for headaches, focal weakness or numbness.  ____________________________________________   PHYSICAL EXAM:  VITAL SIGNS: ED Triage Vitals [10/15/18 1444]  Enc Vitals Group     BP (!) 166/98     Pulse Rate (!) 102     Resp (!) 21     Temp 99.7 F (37.6 C)     Temp Source Oral     SpO2 98 %     Weight 265 lb (120.2 kg)     Height 6\' 2"  (1.88 m)     Head Circumference      Peak Flow      Pain Score 7   Constitutional: Alert and oriented.  Eyes: Conjunctivae are normal.  ENT      Head: Normocephalic and atraumatic.      Nose: No congestion/rhinnorhea.      Mouth/Throat: Mucous membranes are moist.      Neck: No stridor. Hematological/Lymphatic/Immunilogical: No cervical lymphadenopathy. Cardiovascular: Tachycardic, regular rhythm.  No murmurs, rubs, or gallops. Respiratory: Diffuse expiratory wheezing. Tachypnea.  Gastrointestinal: Soft and non tender. No rebound. No guarding.  Genitourinary: Deferred Musculoskeletal: Normal range of motion in all extremities. No lower extremity edema. Neurologic:  Normal speech and language. No gross focal neurologic deficits are appreciated.  Skin:  Skin is warm, dry and intact. No rash noted. Psychiatric: Mood and affect are normal. Speech and behavior are normal. Patient exhibits appropriate insight and judgment.  ____________________________________________    LABS (pertinent positives/negatives)  Trop 2.49 CBC wbc 6.7, hgb 14.2, plt 191 CMP na 134, glu 140, cr 1.45  ____________________________________________   EKG  I, Nance Pear, attending physician, personally viewed and interpreted this EKG  EKG Time: 1443 Rate: 102 Rhythm: sinus tachycardia Axis:  normal Intervals: qtc 429 QRS: narrow ST changes: no st elevation, st depression v4, v5, v6 Impression: abnormal ekg   ____________________________________________    RADIOLOGY  CXR Consistent with COPD  ____________________________________________   PROCEDURES  Procedures  CRITICAL CARE Performed by: Nance Pear   Total critical care time: 35 minutes  Critical care time was exclusive of separately billable procedures and treating other patients.  Critical care was necessary to treat or prevent imminent or life-threatening deterioration.  Critical care was time spent personally by me on the following activities: development of treatment plan with patient and/or surrogate as well as nursing, discussions with consultants, evaluation of patient's response to treatment, examination of patient, obtaining history from patient or surrogate, ordering and performing treatments and interventions, ordering and review of laboratory studies, ordering and review of radiographic studies, pulse oximetry and re-evaluation of patient's condition.  ____________________________________________   INITIAL IMPRESSION / ASSESSMENT AND PLAN / ED COURSE  Pertinent labs & imaging results that were available during my care of the patient were reviewed by me and considered in my medical decision making (see chart for details).   Patient presented to the emergency department today because of concerns for shortness of breath that is been present for the past 3 days.  Is complaining of some concurrent chest pain.  On exam patient had diffuse expiratory wheezing.  Concern for COPD exacerbation versus pneumonia versus ACS versus PE amongst other etiologies.  Patient's troponin returned elevated at greater than 2.  Given the patient's primary concern was for shortness of breath did send a CT angios to evaluate for PE.  This was negative.  Patient will be started on heparin drip for ACS.  Will admit to the  hospital service.  Discussed findings with patient.   ____________________________________________   FINAL CLINICAL IMPRESSION(S) / ED DIAGNOSES  Final diagnoses:  Elevated troponin  Shortness of breath     Note: This dictation was prepared with Dragon dictation. Any transcriptional errors that result from this process are unintentional     Nance Pear, MD 10/15/18 [phone]

## 2018-10-15 NOTE — Progress Notes (Signed)
Patient ID: Matthew [name], male   DOB: 26-May-1951, 67 y.o.   MRN: [id number]  ACP note  Patient and wife at the bedside  Diagnosis: Elevated troponin which could be demand ischemia from the asthma exacerbation versus NSTEMI.    CODE STATUS discussed.  Patient wishes to be a full code.  Patient came in with shortness of breath going on a few days.  Wheezing quite a bit.  Will treat for asthma exacerbation with steroids and nebulizer treatments.  He was found to have an elevated troponin of 2.45.  This could be demand ischemia versus NSTEMI.  We will need to trend cardiac enzymes to see which way things will go.  No beta-blocker with bronchospasm.  Give aspirin and heparin drip.  Case discussed with cardiology.  Time spent on ACP discussion 17 minutes Dr. Loletha Grayer

## 2018-10-15 NOTE — Consult Note (Signed)
Reason for Consult:SOB/CHF/Elevated troponins Referring Physician: Dr Earleen Newport hospitalist  Matthew [name] is an 69 y.o. male.  HPI: Pt is a 68 y/o WM with recent sob dyspnea weakness COPD/Asthmawho was found to have a incresased troponins. No direct sob no cp. He denies prior cardiac history. He c/o of bilat leg edema.He denied mid sternal cp.  Past Medical History:  Diagnosis Date  . Asthma   . Edema   . Hip dislocation, bilateral ([place])   . Hypertension     Past Surgical History:  Procedure Laterality Date  . ABDOMINAL SURGERY    . CHOLECYSTECTOMY    . COLOSTOMY    . COLOSTOMY TAKEDOWN    . HERNIA REPAIR    . TOTAL HIP ARTHROPLASTY Bilateral     Family History  Problem Relation Age of Onset  . COPD Mother   . Melanoma Father     Social History:  reports that he has never smoked. He has never used smokeless tobacco. He reports previous alcohol use. No history on file for drug.  Allergies:  Allergies  Allergen Reactions  . Demerol [Meperidine Hcl]     Medications: I have reviewed the patient's current medications.  Results for orders placed or performed during the hospital encounter of 10/15/18 (from the past 48 hour(s))  CBC     Status: None   Collection Time: 10/15/18  2:52 PM  Result Value Ref Range   WBC 6.7 4.0 - 10.5 K/uL   RBC 4.36 4.22 - 5.81 MIL/uL   Hemoglobin 14.2 13.0 - 17.0 g/dL   HCT 43.6 39.0 - 52.0 %   MCV 100.0 80.0 - 100.0 fL   MCH 32.6 26.0 - 34.0 pg   MCHC 32.6 30.0 - 36.0 g/dL   RDW 12.6 11.5 - 15.5 %   Platelets 191 150 - 400 K/uL   nRBC 0.0 0.0 - 0.2 %    Comment: Performed at [facility], [address] [place], [place] [address]  Troponin I - ONCE - STAT     Status: Abnormal   Collection Time: 10/15/18  2:52 PM  Result Value Ref Range   Troponin I 2.49 (HH) <0.03 ng/mL    Comment: CRITICAL RESULT CALLED TO, READ BACK BY AND VERIFIED WITH AMY COYNE @ 9675 ON 10/15/18 BY JUW Performed at [facility], [address] [place], [place] [address]   Comprehensive metabolic panel     Status: Abnormal   Collection Time: 10/15/18  2:52 PM  Result Value Ref Range   Sodium 134 (L) 135 - 145 mmol/L   Potassium 4.5 3.5 - 5.1 mmol/L   Chloride 103 98 - 111 mmol/L   CO2 23 22 - 32 mmol/L   Glucose, Bld 140 (H) 70 - 99 mg/dL   BUN 20 8 - 23 mg/dL   Creatinine, Ser 1.45 (H) 0.61 - 1.24 mg/dL   Calcium 9.2 8.9 - 10.3 mg/dL   Total Protein 7.7 6.5 - 8.1 g/dL   Albumin 3.8 3.5 - 5.0 g/dL   AST 30 15 - 41 U/L   ALT 15 0 - 44 U/L   Alkaline Phosphatase 93 38 - 126 U/L   Total Bilirubin 1.6 (H) 0.3 - 1.2 mg/dL   GFR calc non Af Amer 49 (L) >60 mL/min   GFR calc Af Amer 57 (L) >60 mL/min   Anion gap 8 5 - 15    Comment: Performed at [facility], [address], [place], [place] [address]  APTT  Status: None   Collection Time: 10/15/18  5:14 PM  Result Value Ref Range   aPTT 34 24 - 36 seconds    Comment: Performed at [facility], [address] [place], [place] [address]  Protime-INR     Status: None   Collection Time: 10/15/18  5:14 PM  Result Value Ref Range   Prothrombin Time 13.3 11.4 - 15.2 seconds   INR 1.0 0.8 - 1.2    Comment: (NOTE) INR goal varies based on device and disease states. Performed at [facility], [address] [place], [place] [address]   Influenza panel by PCR (type A & B)     Status: None   Collection Time: 10/15/18  5:14 PM  Result Value Ref Range   Influenza A By PCR NEGATIVE NEGATIVE   Influenza B By PCR NEGATIVE NEGATIVE    Comment: (NOTE) The Xpert Xpress Flu assay is intended as an aid in the diagnosis of  influenza and should not be used as a sole basis for treatment.  This  assay is FDA approved for nasopharyngeal swab specimens only. Nasal  washings and aspirates are unacceptable for Xpert Xpress Flu testing. Performed at [facility], [address] [place], [place] [address]     Dg Chest 2 View  Result Date:  10/15/2018 CLINICAL DATA:  Dyspnea and cough since Sunday. EXAM: CHEST - 2 VIEW COMPARISON:  12/31/2017 FINDINGS: Stable normal heart size and mediastinal contours. Slight pulmonary hyperinflation consistent with COPD with minimal bibasilar atelectasis, left greater than right. No acute pulmonary consolidation nor overt pulmonary edema. 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 Stable degenerative change along the thoracic spine. IMPRESSION: Pulmonary hyperinflation, chronic in appearance in compatible with COPD with minimal bibasilar atelectasis. No active pulmonary disease. Electronically Signed   By: Ashley Royalty M.D.   On: 10/15/2018 15:06   Ct Angio Chest Pe W And/or Wo Contrast  Result Date: 10/15/2018 CLINICAL DATA:  Dyspnea, cough and congestion since Sunday. EXAM: CT ANGIOGRAPHY CHEST WITH CONTRAST TECHNIQUE: Multidetector CT imaging of the chest was performed using the standard protocol during bolus administration of intravenous contrast. Multiplanar CT image reconstructions and MIPs were obtained to evaluate the vascular anatomy. CONTRAST:  83mL OMNIPAQUE IOHEXOL 350 MG/ML SOLN COMPARISON:  CXR 10/15/2018, chest CT 09/21/2012 FINDINGS: Cardiovascular: Satisfactory opacification of the pulmonary arteries to the segmental level. No evidence of pulmonary embolism. Normal heart size. No pericardial effusion coronary arteriosclerosis is noted of the LAD and left circumflex. Conventional branch pattern of the great vessels with atherosclerosis along the proximal right brachiocephalic. Nonaneurysmal, minimally atherosclerotic thoracic aorta without dissection. Mediastinum/Nodes: No enlarged mediastinal, hilar, or axillary lymph nodes. Thyroid gland, trachea, and esophagus demonstrate no significant findings. Lungs/Pleura: 4 mm nodular density along the left major fissure is more often related to perifissural lymph nodes. Subpleural focus of ground-glass opacity in the left lower  lobe, series 6/60 9 May reflect a focus of minimal atelectasis or inflammation, measuring 11 x 6 mm. No effusion or pneumothorax. A right lower lobe pulmonary bleb is identified. Upper Abdomen: No acute abnormality. 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 No acute nor suspicious osseous lesions. Review of the MIP images confirms the above findings. IMPRESSION: 1. No acute pulmonary embolus. 2. 4 mm perifissural lymph node on the left. Faint ground-glass opacity in the left lower lobe measuring 11 x 6 mm is noted more likely to represent postinfectious or inflammatory change versus atelectasis. Initial follow-up with  CT at 6-12 months is recommended to confirm persistence. If persistent, repeat CT is recommended every 2 years until 5 years of stability has been established. This recommendation follows the consensus statement: Guidelines for Management of Incidental Pulmonary Nodules Detected on CT Images: From the Fleischner Society 2017; Radiology 2017; 284:228-243. Aortic Atherosclerosis (ICD10-I70.0). Electronically Signed   By: Ashley Royalty M.D.   On: 10/15/2018 16:19    Review of Systems  Constitutional: Positive for malaise/fatigue.  HENT: Positive for congestion.   Eyes: Negative.   Respiratory: Positive for shortness of breath and wheezing.   Cardiovascular: Positive for orthopnea, leg swelling and PND.  Gastrointestinal: Negative.   Genitourinary: Negative.   Musculoskeletal: Negative.   Skin: Negative.   Neurological: Negative.   Endo/Heme/Allergies: Negative.   Psychiatric/Behavioral: Negative.    Blood pressure (!) 158/98, pulse 92, temperature 99.7 F (37.6 C), temperature source Oral, resp. rate 19, height 6\' 2"  (1.88 m), weight 120.2 kg, SpO2 95 %. Physical Exam  Nursing note and vitals reviewed. Constitutional: He is oriented to person, place, and time. He appears well-developed and well-nourished.  HENT:  Head: Normocephalic  and atraumatic.  Eyes: Pupils are equal, round, and reactive to light. Conjunctivae and EOM are normal.  Neck: Normal range of motion. Neck supple.  Cardiovascular: Normal rate, regular rhythm and normal heart sounds.  Respiratory: He has decreased breath sounds. He has wheezes. He has rhonchi.  GI: Soft. Bowel sounds are normal.  Musculoskeletal: Normal range of motion.        General: Edema present.  Neurological: He is alert and oriented to person, place, and time. He has normal reflexes.  Skin: Skin is warm and dry.  Psychiatric: He has a normal mood and affect.    Assessment/Plan: COPD/Asthma  Obesity SOB Elevated troponin Hypertension GERD . Plan Agree with ROMI Heparin IV for ACS Inhalers for COPD F/U troponins  ECHO for sob Consider diuretics BP control with Diltiazem/ACE/ARB Continue Omeprazole for reflux Consider Functional study  Matthew [name] 10/15/2018, 9:50 PM

## 2018-10-15 NOTE — H&P (Signed)
[facility] [place] NAME: Matthew [name]    MR#:  [id number]  DATE OF BIRTH:  [birth year]  DATE OF ADMISSION:  10/15/2018  PRIMARY CARE PHYSICIAN: Matthew Athens, MD   REQUESTING/REFERRING PHYSICIAN: Dr Matthew [name]  CHIEF COMPLAINT:   Chief Complaint  Patient presents with  . Shortness of Breath  . Cough    HISTORY OF PRESENT ILLNESS:  Matthew [name]  is a 68 y.o. male with a known history of asthma presents with cough and shortness of breath going on for a few days.  He states Sunday he could not make it to church and just lied on the bed all day.  He felt a little nauseous and had some diarrhea he was having a lot of coughing.  He is having a raspy voice.  Monday he thought his asthma was acting up and he could not breathe.  This a.m. he felt no better so he came in for further evaluation.  In the ER, he was found to have a troponin of 2.49.  Hospitalist services contacted for further evaluation.  PAST MEDICAL HISTORY:   Past Medical History:  Diagnosis Date  . Asthma   . Edema   . Hip dislocation, bilateral ([place])   . Hypertension     PAST SURGICAL HISTORY:   Past Surgical History:  Procedure Laterality Date  . ABDOMINAL SURGERY    . CHOLECYSTECTOMY    . COLOSTOMY    . COLOSTOMY TAKEDOWN    . HERNIA REPAIR    . TOTAL HIP ARTHROPLASTY Bilateral     SOCIAL HISTORY:   Social History   Tobacco Use  . Smoking status: Never Smoker  . Smokeless tobacco: Never Used  Substance Use Topics  . Alcohol use: Not Currently    FAMILY HISTORY:   Family History  Problem Relation Age of Onset  . COPD Mother   . Melanoma Father     DRUG ALLERGIES:   Allergies  Allergen Reactions  . Demerol [Meperidine Hcl]     REVIEW OF SYSTEMS:  CONSTITUTIONAL: Positive for fever, cold and hot feeling.  Positive for fatigue and weakness.  EYES: Positive for blurred vision EARS, NOSE, AND THROAT: No tinnitus or ear pain.  Positive  for sore throat.  Positive for runny nose RESPIRATORY: Positive for cough, shortness of breath, and wheezing.  No hemoptysis.  CARDIOVASCULAR: No chest pain.history of edema.  GASTROINTESTINAL: Positive for nausea, and diarrhea.  Positive for cramping abdominal pain. No blood in bowel movements GENITOURINARY: No dysuria, hematuria.  ENDOCRINE: No polyuria, nocturia,  HEMATOLOGY: No anemia, easy bruising or bleeding SKIN: No rash or lesion. MUSCULOSKELETAL: Positive for hip pains mostly in the right hip NEUROLOGIC: No tingling, numbness, weakness.  PSYCHIATRY: No anxiety or depression.   MEDICATIONS AT HOME:   Prior to Admission medications   Medication Sig Start Date End Date Taking? Authorizing Provider  famotidine (PEPCID) 40 MG tablet Take 1 tablet (40 mg total) by mouth every evening. 01/01/18 01/01/19  Loney Hering, MD  metoCLOPramide (REGLAN) 10 MG tablet Take 1 tablet (10 mg total) by mouth every 8 (eight) hours as needed. 01/01/18   Loney Hering, MD   Medication reconciliation still undergoing.  VITAL SIGNS:  Blood pressure (!) 153/94, pulse (!) 105, temperature 99.7 F (37.6 C), temperature source Oral, resp. rate 19, height 6\' 2"  (1.88 m), weight 120.2 kg, SpO2 97 %.  PHYSICAL EXAMINATION:  GENERAL:  68 y.o.-year-old patient lying in the bed  with no acute distress.  EYES: Pupils equal, round, reactive to light and accommodation. No scleral icterus. Extraocular muscles intact.  HEENT: Head atraumatic, normocephalic. Oropharynx and nasopharynx clear.  NECK:  Supple, no jugular venous distention. No thyroid enlargement, no tenderness.  LUNGS: Decreased breath sounds bilaterally, positive inspiratory and expiratory wheezing.  No rales,rhonchi or crepitation. No use of accessory muscles of respiration.  CARDIOVASCULAR: S1, S2 normal. No murmurs, rubs, or gallops.  ABDOMEN: Soft, nontender, nondistended. Bowel sounds present. No organomegaly or mass.  EXTREMITIES: Trace  pedal edema.no cyanosis, or clubbing.  NEUROLOGIC: Cranial nerves II through XII are intact. Muscle strength 5/5 in all extremities. Sensation intact. Gait not checked.  PSYCHIATRIC: The patient is alert and oriented x 3.  SKIN: No rash, lesion, or ulcer.   LABORATORY PANEL:   CBC Recent Labs  Lab 10/15/18 1452  WBC 6.7  HGB 14.2  HCT 43.6  PLT 191   ------------------------------------------------------------------------------------------------------------------  Chemistries  Recent Labs  Lab 10/15/18 1452  NA 134*  K 4.5  CL 103  CO2 23  GLUCOSE 140*  BUN 20  CREATININE 1.45*  CALCIUM 9.2  AST 30  ALT 15  ALKPHOS 93  BILITOT 1.6*   ------------------------------------------------------------------------------------------------------------------  Cardiac Enzymes Recent Labs  Lab 10/15/18 1452  TROPONINI 2.49*   ------------------------------------------------------------------------------------------------------------------  RADIOLOGY:  Dg Chest 2 View  Result Date: 10/15/2018 CLINICAL DATA:  Dyspnea and cough since Sunday. EXAM: CHEST - 2 VIEW COMPARISON:  12/31/2017 FINDINGS: Stable normal heart size and mediastinal contours. Slight pulmonary hyperinflation consistent with COPD with minimal bibasilar atelectasis, left greater than right. No acute pulmonary consolidation nor overt pulmonary edema. 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 Stable degenerative change along the thoracic spine. IMPRESSION: Pulmonary hyperinflation, chronic in appearance in compatible with COPD with minimal bibasilar atelectasis. No active pulmonary disease. Electronically Signed   By: Matthew [name] M.D.   On: 10/15/2018 15:06   Ct Angio Chest Pe W And/or Wo Contrast  Result Date: 10/15/2018 CLINICAL DATA:  Dyspnea, cough and congestion since Sunday. EXAM: CT ANGIOGRAPHY CHEST WITH CONTRAST TECHNIQUE: Multidetector CT imaging of the chest was performed  using the standard protocol during bolus administration of intravenous contrast. Multiplanar CT image reconstructions and MIPs were obtained to evaluate the vascular anatomy. CONTRAST:  70mL OMNIPAQUE IOHEXOL 350 MG/ML SOLN COMPARISON:  CXR 10/15/2018, chest CT 09/21/2012 FINDINGS: Cardiovascular: Satisfactory opacification of the pulmonary arteries to the segmental level. No evidence of pulmonary embolism. Normal heart size. No pericardial effusion coronary arteriosclerosis is noted of the LAD and left circumflex. Conventional branch pattern of the great vessels with atherosclerosis along the proximal right brachiocephalic. Nonaneurysmal, minimally atherosclerotic thoracic aorta without dissection. Mediastinum/Nodes: No enlarged mediastinal, hilar, or axillary lymph nodes. Thyroid gland, trachea, and esophagus demonstrate no significant findings. Lungs/Pleura: 4 mm nodular density along the left major fissure is more often related to perifissural lymph nodes. Subpleural focus of ground-glass opacity in the left lower lobe, series 6/60 9 May reflect a focus of minimal atelectasis or inflammation, measuring 11 x 6 mm. No effusion or pneumothorax. A right lower lobe pulmonary bleb is identified. Upper Abdomen: No acute abnormality. 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 No acute nor suspicious osseous lesions. Review of the MIP images confirms the above findings. IMPRESSION: 1. No acute pulmonary embolus. 2. 4 mm perifissural lymph node on the left. Faint ground-glass opacity in the left lower lobe measuring 11 x 6 mm is noted more likely to represent postinfectious or  inflammatory change versus atelectasis. Initial follow-up with CT at 6-12 months is recommended to confirm persistence. If persistent, repeat CT is recommended every 2 years until 5 years of stability has been established. This recommendation follows the consensus statement: Guidelines for  Management of Incidental Pulmonary Nodules Detected on CT Images: From the Fleischner Society 2017; Radiology 2017; 284:228-243. Aortic Atherosclerosis (ICD10-I70.0). Electronically Signed   By: Matthew [name] M.D.   On: 10/15/2018 16:19    EKG:   Sinus tachycardia 102 bpm, left atrial enlargement, ST depression anteriorly and laterally  IMPRESSION AND PLAN:   1.  Asthma exacerbation.  Will give IV magnesium.  IV Solu-Medrol nebulizer treatments. 2.  Elevated troponin.  Could be demand ischemia versus an STEMI.  Will get cardiology consultation.  Because of all the bronchospasm I will not give beta-blocker currently.  Give aspirin and heparin drip.  Get an echocardiogram. 3.  Hypertension on low-dose lisinopril. 4.  Depression on Cymbalta 5.  Chronic pain on buprenorphine patch. 6.  Chronic kidney disease stage III.  Will give gentle IV fluid hydration with him receiving IV contrast with CT scan.  All the records are reviewed and case discussed with ED provider. Management plans discussed with the patient, family and they are in agreement.  CODE STATUS: Full Code  TOTAL TIME TAKING CARE OF THIS PATIENT: 50 minutes.    Loletha Grayer M.D on 10/15/2018 at 5:39 PM  Between 7am to 6pm - Pager - [phone]  After 6pm call admission pager [phone]  [facility] Office  [phone]  CC: Primary care physician; Matthew Athens, MD

## 2018-10-15 NOTE — ED Notes (Signed)
Resumed care from ally rn.  Pt alert.  Family with pt.  nsr on monitor.  md at bedside

## 2018-10-15 NOTE — Progress Notes (Signed)
ANTICOAGULATION CONSULT NOTE - Initial Consult  Pharmacy Consult for heparin Indication: chest pain/ACS  Allergies  Allergen Reactions  . Demerol [Meperidine Hcl]     Patient Measurements: Height: 6\' 2"  (188 cm) Weight: 265 lb (120.2 kg) IBW/kg (Calculated) : 82.2 Heparin Dosing Weight: 107.99 kg   Vital Signs: Temp: 99.7 F (37.6 C) (03/03 1444) Temp Source: Oral (03/03 1444) BP: 157/80 (03/03 2300) Pulse Rate: 73 (03/03 2300)  Labs: Recent Labs    10/15/18 1452 10/15/18 1714 10/15/18 2230  HGB 14.2  --   --   HCT 43.6  --   --   PLT 191  --   --   APTT  --  34  --   LABPROT  --  13.3  --   INR  --  1.0  --   HEPARINUNFRC  --   --  0.43  CREATININE 1.45*  --   --   TROPONINI 2.49*  --   --     Estimated Creatinine Clearance: 67.2 mL/min (A) (by C-G formula based on SCr of 1.45 mg/dL (H)).   Medical History: Past Medical History:  Diagnosis Date  . Asthma   . Edema   . Hip dislocation, bilateral ([place])   . Hypertension     Medications:  (Not in a hospital admission)  Scheduled:  . [START ON 10/16/2018] aspirin EC  81 mg Oral Daily  . budesonide (PULMICORT) nebulizer solution  0.5 mg Nebulization BID  . DULoxetine  60 mg Oral BID  . ipratropium-albuterol  3 mL Nebulization Q6H  . [START ON 10/16/2018] lisinopril  5 mg Oral Daily  . [START ON 10/16/2018] methylPREDNISolone (SOLU-MEDROL) injection  40 mg Intravenous Daily   Infusions:  . sodium chloride 50 mL/hr at 10/15/18 2043  . heparin 1,350 Units/hr (10/15/18 1710)   PRN:   Assessment: Pharmacy was consulted to start heparin. No note of DOAC PTA.   Goal of Therapy:  Heparin level 0.3-0.7 units/ml Monitor platelets by anticoagulation protocol: Yes   Plan:  Give 4000 units bolus x 1 Start heparin infusion at 1350 units/hr Check anti-Xa level in 6 hours and daily while on heparin Continue to monitor H&H and platelets   3/3 PM heparin level 0.43. Continue current regimen. Recheck heparin level  and CBC with tomorrow AM labs.  Eloise Harman, PharmD, BCPS Clinical Pharmacist  10/15/2018,11:23 PM

## 2018-10-15 NOTE — ED Notes (Signed)
Placed on 2L [place]  

## 2018-10-16 ENCOUNTER — Inpatient Hospital Stay
Admit: 2018-10-16 | Discharge: 2018-10-16 | Disposition: A | Discharge status: Home / Self Care | Payer: Medicare PPO | Attending: Internal Medicine | Admitting: Internal Medicine

## 2018-10-16 LAB — RESPIRATORY PANEL BY PCR
Adenovirus: NOT DETECTED
Bordetella pertussis: NOT DETECTED
CORONAVIRUS 229E-RVPPCR: NOT DETECTED
Chlamydophila pneumoniae: NOT DETECTED
Coronavirus HKU1: NOT DETECTED
Coronavirus NL63: NOT DETECTED
Coronavirus OC43: NOT DETECTED
Influenza A: NOT DETECTED
Influenza B: NOT DETECTED
Metapneumovirus: DETECTED — AB
Mycoplasma pneumoniae: NOT DETECTED
Parainfluenza Virus 1: NOT DETECTED
Parainfluenza Virus 2: NOT DETECTED
Parainfluenza Virus 3: NOT DETECTED
Parainfluenza Virus 4: NOT DETECTED
Respiratory Syncytial Virus: NOT DETECTED
Rhinovirus / Enterovirus: NOT DETECTED

## 2018-10-16 LAB — LIPID PANEL
Cholesterol: 110 mg/dL (ref 0–200)
HDL: 51 mg/dL (ref >40)
LDL Cholesterol: 50 mg/dL (ref 0–99)
Total CHOL/HDL Ratio: 2.2 RATIO
Triglycerides: 43 mg/dL (ref <150)
VLDL: 9 mg/dL (ref 0–40)

## 2018-10-16 LAB — CBC
HCT: 40.2 % (ref 39.0–52.0)
Hemoglobin: 13 g/dL (ref 13.0–17.0)
MCH: 32 pg (ref 26.0–34.0)
MCHC: 32.3 g/dL (ref 30.0–36.0)
MCV: 99 fL (ref 80.0–100.0)
NRBC: 0 % (ref 0.0–0.2)
Platelets: 183 10*3/uL (ref 150–400)
RBC: 4.06 MIL/uL — ABNORMAL LOW (ref 4.22–5.81)
RDW: 12.4 % (ref 11.5–15.5)
WBC: 4.8 10*3/uL (ref 4.0–10.5)

## 2018-10-16 LAB — BASIC METABOLIC PANEL
Anion gap: 8 (ref 5–15)
BUN: 26 mg/dL — ABNORMAL HIGH (ref 8–23)
CO2: 22 mmol/L (ref 22–32)
Calcium: 9.3 mg/dL (ref 8.9–10.3)
Chloride: 104 mmol/L (ref 98–111)
Creatinine, Ser: 1.4 mg/dL — ABNORMAL HIGH (ref 0.61–1.24)
GFR calc Af Amer: 59 mL/min — ABNORMAL LOW (ref >60)
GFR, EST NON AFRICAN AMERICAN: 51 mL/min — AB (ref >60)
Glucose, Bld: 149 mg/dL — ABNORMAL HIGH (ref 70–99)
Potassium: 4.8 mmol/L (ref 3.5–5.1)
SODIUM: 134 mmol/L — AB (ref 135–145)

## 2018-10-16 LAB — TROPONIN I: TROPONIN I: 0.85 ng/mL — AB (ref <0.03)

## 2018-10-16 LAB — HEPARIN LEVEL (UNFRACTIONATED): HEPARIN UNFRACTIONATED: 0.39 [IU]/mL (ref 0.30–0.70)

## 2018-10-16 MED ORDER — ASPIRIN 81 MG PO CHEW
81.0000 mg | CHEWABLE_TABLET | ORAL | Status: AC
  Administered 2018-10-17: 81 mg via ORAL

## 2018-10-16 MED ORDER — SODIUM CHLORIDE 0.9% FLUSH
3.0000 mL | Freq: Two times a day (BID) | INTRAVENOUS | Status: DC
  Administered 2018-10-16: 3 mL via INTRAVENOUS

## 2018-10-16 MED ORDER — SODIUM CHLORIDE 0.9% FLUSH: 3.0000 mL | INTRAVENOUS | Status: DC | PRN

## 2018-10-16 MED ORDER — SODIUM CHLORIDE 0.9 % WEIGHT BASED INFUSION
1.0000 mL/kg/h | INTRAVENOUS | Status: DC
  Administered 2018-10-17: 1 mL/kg/h via INTRAVENOUS

## 2018-10-16 MED ORDER — SODIUM CHLORIDE 0.9 % WEIGHT BASED INFUSION: 3.0000 mL/kg/h | INTRAVENOUS | Status: DC

## 2018-10-16 MED ORDER — TIZANIDINE HCL 4 MG PO TABS
4.0000 mg | ORAL_TABLET | Freq: Three times a day (TID) | ORAL | Status: DC
  Administered 2018-10-16 – 2018-10-18 (×6): 4 mg via ORAL
  Filled 2018-10-16 (×7): qty 1

## 2018-10-16 MED ORDER — INFLUENZA VAC SPLIT HIGH-DOSE 0.5 ML IM SUSY
0.5000 mL | PREFILLED_SYRINGE | INTRAMUSCULAR | Status: DC
  Filled 2018-10-16: qty 0.5

## 2018-10-16 MED ORDER — OXYCODONE-ACETAMINOPHEN 5-325 MG PO TABS
1.0000 | ORAL_TABLET | Freq: Four times a day (QID) | ORAL | Status: DC | PRN
  Administered 2018-10-16 – 2018-10-18 (×3): 1 via ORAL
  Filled 2018-10-16 (×4): qty 1

## 2018-10-16 MED ORDER — SODIUM CHLORIDE 0.9 % IV SOLN: 250.0000 mL | INTRAVENOUS | Status: DC | PRN

## 2018-10-16 NOTE — Progress Notes (Addendum)
ANTICOAGULATION CONSULT NOTE - Initial Consult  Pharmacy Consult for heparin Indication: chest pain/ACS  Allergies  Allergen Reactions  . Demerol [Meperidine Hcl]     Patient Measurements: Height: 6\' 2"  (188 cm) Weight: 265 lb (120.2 kg) IBW/kg (Calculated) : 82.2 Heparin Dosing Weight: 107.99 kg   Vital Signs: BP: 152/85 (03/04 0545) Pulse Rate: 63 (03/04 0545)  Labs: Recent Labs    10/15/18 1452 10/15/18 1714 10/15/18 2230 10/16/18 0704  HGB 14.2  --   --  13.0  HCT 43.6  --   --  40.2  PLT 191  --   --  183  APTT  --  34  --   --   LABPROT  --  13.3  --   --   INR  --  1.0  --   --   HEPARINUNFRC  --   --  0.43 0.39  CREATININE 1.45*  --   --   --   TROPONINI 2.49*  --  1.51*  --     Estimated Creatinine Clearance: 67.2 mL/min (A) (by C-G formula based on SCr of 1.45 mg/dL (H)).   Medical History: Past Medical History:  Diagnosis Date  . Asthma   . Edema   . Hip dislocation, bilateral ([place])   . Hypertension     Medications:  (Not in a hospital admission)  Scheduled:  . aspirin EC  81 mg Oral Daily  . budesonide (PULMICORT) nebulizer solution  0.5 mg Nebulization BID  . DULoxetine  60 mg Oral BID  . ipratropium-albuterol  3 mL Nebulization Q6H  . lisinopril  5 mg Oral Daily  . methylPREDNISolone (SOLU-MEDROL) injection  40 mg Intravenous Daily   Infusions:  . sodium chloride 50 mL/hr at 10/16/18 0140  . heparin 1,350 Units/hr (10/16/18 0118)   PRN:   Assessment: Pharmacy was consulted to start heparin. No note of DOAC PTA.   Goal of Therapy:  Heparin level 0.3-0.7 units/ml Monitor platelets by anticoagulation protocol: Yes   Plan:  Give 4000 units bolus x 1 Start heparin infusion at 1350 units/hr Check anti-Xa level in 6 hours and daily while on heparin Continue to monitor H&H and platelets   3/3 PM heparin level 0.43 (therapeutic x 1) 3/4 AM heparin level 0.39 (therapeuic x 2)  Continue current regimen (1350 units/hr).   Recheck  heparin level and CBC with tomorrow AM labs.  Lu Duffel, PharmD, BCPS Clinical Pharmacist  10/16/2018,8:05 AM

## 2018-10-16 NOTE — ED Notes (Signed)
ED TO INPATIENT HANDOFF REPORT  ED Nurse Name and Phone #: [phone]  S Name/Age/Gender Matthew [name] 68 y.o. male Room/Bed: ED34A/ED34A  Code Status   Code Status: Full Code  Home/SNF/Other Home Patient oriented to: self, place, time and situation Is this baseline? Yes   Triage Complete: Triage complete  Chief Complaint Shortness of breath [R06.02] Elevated troponin [R79.89]  Triage Note Sob and cough/congestion since Sunday. Productive clear sputum. Audible wheezing.   Allergies Allergies  Allergen Reactions  . Demerol [Meperidine Hcl]     Level of Care/Admitting Diagnosis ED Disposition    ED Disposition Condition [facility]: [place] [[id number]]  Level of Care: Telemetry [5]  Diagnosis: Acute asthma exacerbation [841660]  Admitting Physician: Loletha Grayer [[id number]]  Attending Physician: Loletha Grayer [[id number]]  Estimated length of stay: past midnight tomorrow  Certification:: I certify this patient will need inpatient services for at least 2 midnights  PT Class (Do Not Modify): Inpatient [101]  PT Acc Code (Do Not Modify): Private [1]       B Medical/Surgery History Past Medical History:  Diagnosis Date  . Asthma   . Edema   . Hip dislocation, bilateral ([place])   . Hypertension    Past Surgical History:  Procedure Laterality Date  . ABDOMINAL SURGERY    . CHOLECYSTECTOMY    . COLOSTOMY    . COLOSTOMY TAKEDOWN    . HERNIA REPAIR    . TOTAL HIP ARTHROPLASTY Bilateral      A IV Location/Drains/Wounds Patient Lines/Drains/Airways Status   Active Line/Drains/Airways    Name:   Placement date:   Placement time:   Site:   Days:   Peripheral IV 01/01/18 Right Antecubital   01/01/18    0049    Antecubital   288   Peripheral IV 10/15/18 Left Antecubital   10/15/18    1449    Antecubital   1          Intake/Output Last 24 hours  Intake/Output Summary (Last 24 hours) at 10/16/2018 1657 Last data filed  at 10/16/2018 0700 Gross per 24 hour  Intake 30 ml  Output 850 ml  Net -820 ml    Labs/Imaging Results for orders placed or performed during the hospital encounter of 10/15/18 (from the past 48 hour(s))  CBC     Status: None   Collection Time: 10/15/18  2:52 PM  Result Value Ref Range   WBC 6.7 4.0 - 10.5 K/uL   RBC 4.36 4.22 - 5.81 MIL/uL   Hemoglobin 14.2 13.0 - 17.0 g/dL   HCT 43.6 39.0 - 52.0 %   MCV 100.0 80.0 - 100.0 fL   MCH 32.6 26.0 - 34.0 pg   MCHC 32.6 30.0 - 36.0 g/dL   RDW 12.6 11.5 - 15.5 %   Platelets 191 150 - 400 K/uL   nRBC 0.0 0.0 - 0.2 %    Comment: Performed at [facility], [address] [place], [place] [address]  Troponin I - ONCE - STAT     Status: Abnormal   Collection Time: 10/15/18  2:52 PM  Result Value Ref Range   Troponin I 2.49 (HH) <0.03 ng/mL    Comment: CRITICAL RESULT CALLED TO, READ BACK BY AND VERIFIED WITH AMY COYNE @ 2025 ON 10/15/18 BY JUW Performed at [facility], [address], [place],  [address]   Comprehensive metabolic panel     Status: Abnormal   Collection Time: 10/15/18  2:52 PM  Result Value Ref Range   Sodium 134 (L) 135 - 145 mmol/L   Potassium 4.5 3.5 - 5.1 mmol/L   Chloride 103 98 - 111 mmol/L   CO2 23 22 - 32 mmol/L   Glucose, Bld 140 (H) 70 - 99 mg/dL   BUN 20 8 - 23 mg/dL   Creatinine, Ser 1.45 (H) 0.61 - 1.24 mg/dL   Calcium 9.2 8.9 - 10.3 mg/dL   Total Protein 7.7 6.5 - 8.1 g/dL   Albumin 3.8 3.5 - 5.0 g/dL   AST 30 15 - 41 U/L   ALT 15 0 - 44 U/L   Alkaline Phosphatase 93 38 - 126 U/L   Total Bilirubin 1.6 (H) 0.3 - 1.2 mg/dL   GFR calc non Af Amer 49 (L) >60 mL/min   GFR calc Af Amer 57 (L) >60 mL/min   Anion gap 8 5 - 15    Comment: Performed at [facility], [address] [place], [place] [address]  APTT     Status: None   Collection Time: 10/15/18  5:14 PM  Result Value Ref Range   aPTT 34 24 - 36 seconds    Comment: Performed at [facility], [address] [place], [place] [address]  Protime-INR     Status: None   Collection Time: 10/15/18  5:14 PM  Result Value Ref Range   Prothrombin Time 13.3 11.4 - 15.2 seconds   INR 1.0 0.8 - 1.2    Comment: (NOTE) INR goal varies based on device and disease states. Performed at [facility], [address] [place], [place] [address]   Influenza panel by PCR (type A & B)     Status: None   Collection Time: 10/15/18  5:14 PM  Result Value Ref Range   Influenza A By PCR NEGATIVE NEGATIVE   Influenza B By PCR NEGATIVE NEGATIVE    Comment: (NOTE) The Xpert Xpress Flu assay is intended as an aid in the diagnosis of  influenza and should not be used as a sole basis for treatment.  This  assay is FDA approved for nasopharyngeal swab specimens only. Nasal  washings and aspirates are unacceptable for Xpert Xpress Flu testing. Performed at [facility], [address] [place], [place] [address]   Respiratory Panel by PCR     Status: Abnormal   Collection Time: 10/15/18  5:36 PM  Result Value Ref Range   Adenovirus NOT DETECTED NOT DETECTED   Coronavirus 229E NOT DETECTED NOT DETECTED    Comment: (NOTE) The Coronavirus on the Respiratory Panel, DOES NOT test for the novel  Coronavirus (2019 nCoV)    Coronavirus HKU1 NOT DETECTED NOT DETECTED   Coronavirus NL63 NOT DETECTED NOT DETECTED   Coronavirus OC43 NOT DETECTED NOT DETECTED   Metapneumovirus DETECTED (A) NOT DETECTED   Rhinovirus / Enterovirus NOT DETECTED NOT DETECTED   Influenza A NOT DETECTED NOT DETECTED   Influenza B NOT DETECTED NOT DETECTED   Parainfluenza Virus 1 NOT DETECTED NOT DETECTED   Parainfluenza Virus 2 NOT DETECTED NOT DETECTED   Parainfluenza Virus 3 NOT DETECTED NOT DETECTED   Parainfluenza Virus 4 NOT DETECTED NOT DETECTED   Respiratory Syncytial Virus NOT DETECTED NOT DETECTED   Bordetella pertussis NOT DETECTED NOT DETECTED   Chlamydophila pneumoniae NOT DETECTED NOT DETECTED    Mycoplasma pneumoniae NOT DETECTED NOT DETECTED    Comment: Performed at [facility], [place] [address] [place], [place] [address]  Heparin level (unfractionated)     Status: None   Collection Time: 10/15/18 10:30 PM  Result Value Ref Range   Heparin Unfractionated 0.43 0.30 - 0.70 IU/mL    Comment: (NOTE) If heparin results are below expected values, and patient dosage has  been confirmed, suggest follow up testing of antithrombin III levels. Performed at [facility], [address] [place], [place] [address]   Troponin I - Once     Status: Abnormal   Collection Time: 10/15/18 10:30 PM  Result Value Ref Range   Troponin I 1.51 (HH) <0.03 ng/mL    Comment: CRITICAL VALUE NOTED. VALUE IS CONSISTENT WITH PREVIOUSLY REPORTED/CALLED VALUE / [place] Performed at [facility], [address] [place], [place] [address]   Lipid panel     Status: None   Collection Time: 10/16/18  7:04 AM  Result Value Ref Range   Cholesterol 110 0 - 200 mg/dL   Triglycerides 43 <150 mg/dL   HDL 51 >40 mg/dL   Total CHOL/HDL Ratio 2.2 RATIO   VLDL 9 0 - 40 mg/dL   LDL Cholesterol 50 0 - 99 mg/dL    Comment:        Total Cholesterol/HDL:CHD Risk Coronary Heart Disease Risk Table                     Men   Women  1/2 Average Risk   3.4   3.3  Average Risk       5.0   4.4  2 X Average Risk   9.6   7.1  3 X Average Risk  23.4   11.0        Use the calculated Patient Ratio above and the CHD Risk Table to determine the patient's CHD Risk.        ATP III CLASSIFICATION (LDL):  <100     mg/dL   Optimal  100-129  mg/dL   Near or Above                    Optimal  130-159  mg/dL   Borderline  160-189  mg/dL   High  >190     mg/dL   Very High Performed at [facility], [address] [place], [place] [address]   Basic metabolic panel     Status: Abnormal   Collection Time: 10/16/18  7:04 AM  Result Value Ref Range   Sodium 134 (L) 135 - 145 mmol/L   Potassium 4.8 3.5  - 5.1 mmol/L   Chloride 104 98 - 111 mmol/L   CO2 22 22 - 32 mmol/L   Glucose, Bld 149 (H) 70 - 99 mg/dL   BUN 26 (H) 8 - 23 mg/dL   Creatinine, Ser 1.40 (H) 0.61 - 1.24 mg/dL   Calcium 9.3 8.9 - 10.3 mg/dL   GFR calc non Af Amer 51 (L) >60 mL/min   GFR calc Af Amer 59 (L) >60 mL/min   Anion gap 8 5 - 15    Comment: Performed at [facility], [address] [place], [place] [address]  CBC     Status: Abnormal   Collection Time: 10/16/18  7:04 AM  Result Value Ref Range   WBC 4.8 4.0 - 10.5 K/uL   RBC 4.06 (L) 4.22 - 5.81 MIL/uL   Hemoglobin 13.0 13.0 - 17.0 g/dL   HCT 40.2 39.0 - 52.0 %   MCV 99.0 80.0 - 100.0 fL   MCH 32.0  26.0 - 34.0 pg   MCHC 32.3 30.0 - 36.0 g/dL   RDW 12.4 11.5 - 15.5 %   Platelets 183 150 - 400 K/uL   nRBC 0.0 0.0 - 0.2 %    Comment: Performed at [facility], [address] [place], [place] [address]  Troponin I -     Status: Abnormal   Collection Time: 10/16/18  7:04 AM  Result Value Ref Range   Troponin I 0.85 (HH) <0.03 ng/mL    Comment: CRITICAL VALUE NOTED. VALUE IS CONSISTENT WITH PREVIOUSLY REPORTED/CALLED VALUE JJB Performed at [facility], [place], [place] [address]   Heparin level (unfractionated)     Status: None   Collection Time: 10/16/18  7:04 AM  Result Value Ref Range   Heparin Unfractionated 0.39 0.30 - 0.70 IU/mL    Comment: (NOTE) If heparin results are below expected values, and patient dosage has  been confirmed, suggest follow up testing of antithrombin III levels. Performed at [facility], [address] [place], [place] [address]    Dg Chest 2 View  Result Date: 10/15/2018 CLINICAL DATA:  Dyspnea and cough since Sunday. EXAM: CHEST - 2 VIEW COMPARISON:  12/31/2017 FINDINGS: Stable normal heart size and mediastinal contours. Slight pulmonary hyperinflation consistent with COPD with minimal bibasilar atelectasis, left greater than right. No acute pulmonary  consolidation nor overt pulmonary edema. 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 Stable degenerative change along the thoracic spine. IMPRESSION: Pulmonary hyperinflation, chronic in appearance in compatible with COPD with minimal bibasilar atelectasis. No active pulmonary disease. Electronically Signed   By: Ashley Royalty M.D.   On: 10/15/2018 15:06   Ct Angio Chest Pe W And/or Wo Contrast  Result Date: 10/15/2018 CLINICAL DATA:  Dyspnea, cough and congestion since Sunday. EXAM: CT ANGIOGRAPHY CHEST WITH CONTRAST TECHNIQUE: Multidetector CT imaging of the chest was performed using the standard protocol during bolus administration of intravenous contrast. Multiplanar CT image reconstructions and MIPs were obtained to evaluate the vascular anatomy. CONTRAST:  13mL OMNIPAQUE IOHEXOL 350 MG/ML SOLN COMPARISON:  CXR 10/15/2018, chest CT 09/21/2012 FINDINGS: Cardiovascular: Satisfactory opacification of the pulmonary arteries to the segmental level. No evidence of pulmonary embolism. Normal heart size. No pericardial effusion coronary arteriosclerosis is noted of the LAD and left circumflex. Conventional branch pattern of the great vessels with atherosclerosis along the proximal right brachiocephalic. Nonaneurysmal, minimally atherosclerotic thoracic aorta without dissection. Mediastinum/Nodes: No enlarged mediastinal, hilar, or axillary lymph nodes. Thyroid gland, trachea, and esophagus demonstrate no significant findings. Lungs/Pleura: 4 mm nodular density along the left major fissure is more often related to perifissural lymph nodes. Subpleural focus of ground-glass opacity in the left lower lobe, series 6/60 9 May reflect a focus of minimal atelectasis or inflammation, measuring 11 x 6 mm. No effusion or pneumothorax. A right lower lobe pulmonary bleb is identified. Upper Abdomen: No acute abnormality. 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 No acute nor suspicious osseous lesions. Review of the MIP images confirms the above findings. IMPRESSION: 1. No acute pulmonary embolus. 2. 4 mm perifissural lymph node on the left. Faint ground-glass opacity in the left lower lobe measuring 11 x 6 mm is noted more likely to represent postinfectious or inflammatory change versus atelectasis. Initial follow-up with CT at 6-12 months is recommended to confirm persistence. If persistent, repeat CT is recommended every 2 years until 5 years of stability has been established. This recommendation follows the consensus statement: Guidelines for  Management of Incidental Pulmonary Nodules Detected on CT Images: From the Fleischner Society 2017; Radiology 2017; 284:228-243. Aortic Atherosclerosis (ICD10-I70.0). Electronically Signed   By: Ashley Royalty M.D.   On: 10/15/2018 16:19    Pending Labs Unresulted Labs (From admission, onward)    Start     Ordered   10/17/18 0500  Heparin level (unfractionated)  Tomorrow morning,   STAT     10/16/18 0809   10/17/18 0500  CBC  Tomorrow morning,   STAT     10/16/18 0809   10/16/18 0500  HIV Antibody (routine testing w rflx)  Once,   R     10/16/18 0500          Vitals/Pain Today's Vitals   10/16/18 1000 10/16/18 1104 10/16/18 1456 10/16/18 1536  BP: 129/83 (!) 142/64 (!) 157/77   Pulse: 61 60 80 66  Resp: 16 16 18 18   Temp:   (!) 97.5 F (36.4 C) 97.7 F (36.5 C)  TempSrc:   Oral Oral  SpO2:  97% 98% 94%  Weight:      Height:      PainSc:    5     Isolation Precautions No active isolations  Medications Medications  heparin ADULT infusion 100 units/mL (25000 units/278mL sodium chloride 0.45%) (1,350 Units/hr Intravenous Restarted 10/16/18 1621)  ipratropium-albuterol (DUONEB) 0.5-2.5 (3) MG/3ML nebulizer solution 3 mL ( Nebulization MAR Unhold 10/16/18 1644)  budesonide (PULMICORT) nebulizer solution 0.5 mg ( Nebulization MAR Unhold 10/16/18 1644)  aspirin EC tablet 81 mg ( Oral  MAR Unhold 10/16/18 1644)  acetaminophen (TYLENOL) tablet 650 mg ( Oral MAR Unhold 10/16/18 1644)    Or  acetaminophen (TYLENOL) suppository 650 mg ( Rectal MAR Unhold 10/16/18 1644)  lisinopril (PRINIVIL,ZESTRIL) tablet 5 mg ( Oral MAR Unhold 10/16/18 1644)  DULoxetine (CYMBALTA) DR capsule 60 mg ( Oral MAR Unhold 10/16/18 1644)  methylPREDNISolone sodium succinate (SOLU-MEDROL) 40 mg/mL injection 40 mg ( Intravenous MAR Unhold 10/16/18 1644)  0.9 %  sodium chloride infusion ( Intravenous New Bag/Given 10/16/18 0140)  oxyCODONE-acetaminophen (PERCOCET/ROXICET) 5-325 MG per tablet 1 tablet ( Oral MAR Unhold 10/16/18 1644)  albuterol (PROVENTIL) (2.5 MG/3ML) 0.083% nebulizer solution 5 mg (5 mg Nebulization Given 10/15/18 1503)  ipratropium-albuterol (DUONEB) 0.5-2.5 (3) MG/3ML nebulizer solution 3 mL (3 mLs Nebulization Given 10/15/18 1535)  ipratropium-albuterol (DUONEB) 0.5-2.5 (3) MG/3ML nebulizer solution 3 mL (3 mLs Nebulization Given 10/15/18 1535)  methylPREDNISolone sodium succinate (SOLU-MEDROL) 125 mg/2 mL injection 125 mg (125 mg Intravenous Given 10/15/18 1535)  aspirin chewable tablet 324 mg (324 mg Oral Given 10/15/18 1703)  iohexol (OMNIPAQUE) 350 MG/ML injection 75 mL (75 mLs Intravenous Contrast Given 10/15/18 1606)  heparin bolus via infusion 4,000 Units (4,000 Units Intravenous Bolus from Bag 10/15/18 1709)  magnesium sulfate IVPB 2 g 50 mL (0 g Intravenous Stopped 10/15/18 2023)    Mobility walks with device High fall risk   Focused Assessments Cardiac Assessment Handoff:  Cardiac Rhythm: Normal sinus rhythm Lab Results  Component Value Date   CKTOTAL 51 09/21/2012   CKMB < 0.5 (L) 09/22/2012   TROPONINI 0.85 (HH) 10/16/2018   No results found for: DDIMER Does the Patient currently have chest pain? No     R Recommendations: See Admitting Provider Note  Report given to:   Additional Notes: pt went for his cath today but was taken off the table for an emergent case. Cath will be done  tomorrow.

## 2018-10-16 NOTE — Progress Notes (Signed)
Heart cath was cancelled per Dr. Clayborn Bigness due to STEMI arriving. Patient was placed back on heparin drip at previous rate. Plan is for heart cath tomorrow morning.

## 2018-10-16 NOTE — ED Notes (Signed)
Pt moved to cpod.  Report off to [place] rn

## 2018-10-16 NOTE — ED Notes (Signed)
Pt watching tv

## 2018-10-16 NOTE — Progress Notes (Signed)
[place] [facility] [place] NAME: Matthew [name]    MR#:  [id number]  DATE OF BIRTH:  06/27/1951  SUBJECTIVE:  CHIEF COMPLAINT:   Chief Complaint  Patient presents with  . Shortness of Breath  . Cough  hungry, no other complaints REVIEW OF SYSTEMS:  Review of Systems  Constitutional: Negative for diaphoresis, fever, malaise/fatigue and weight loss.  HENT: Negative for ear discharge, ear pain, hearing loss, nosebleeds, sore throat and tinnitus.   Eyes: Negative for blurred vision and pain.  Respiratory: Negative for cough, hemoptysis, shortness of breath and wheezing.   Cardiovascular: Negative for chest pain, palpitations, orthopnea and leg swelling.  Gastrointestinal: Negative for abdominal pain, blood in stool, constipation, diarrhea, heartburn, nausea and vomiting.  Genitourinary: Negative for dysuria, frequency and urgency.  Musculoskeletal: Negative for back pain and myalgias.  Skin: Negative for itching and rash.  Neurological: Negative for dizziness, tingling, tremors, focal weakness, seizures, weakness and headaches.  Psychiatric/Behavioral: Negative for depression. The patient is not nervous/anxious.     DRUG ALLERGIES:   Allergies  Allergen Reactions  . Demerol [Meperidine Hcl]    VITALS:  Blood pressure (!) 165/86, pulse 98, temperature 98 F (36.7 C), resp. rate 20, height 6\' 2"  (1.88 m), weight 120.2 kg, SpO2 94 %. PHYSICAL EXAMINATION:  Physical Exam HENT:     Head: Normocephalic and atraumatic.  Eyes:     Conjunctiva/sclera: Conjunctivae normal.     Pupils: Pupils are equal, round, and reactive to light.  Neck:     Musculoskeletal: Normal range of motion and neck supple.     Thyroid: No thyromegaly.     Trachea: No tracheal deviation.  Cardiovascular:     Rate and Rhythm: Normal rate and regular rhythm.     Heart sounds: Normal heart sounds.  Pulmonary:     Effort: Pulmonary effort is normal. No respiratory  distress.     Breath sounds: Normal breath sounds. No wheezing.  Chest:     Chest wall: No tenderness.  Abdominal:     General: Bowel sounds are normal. There is no distension.     Palpations: Abdomen is soft.     Tenderness: There is no abdominal tenderness.  Musculoskeletal: Normal range of motion.  Skin:    General: Skin is warm and dry.     Findings: No rash.  Neurological:     Mental Status: He is alert and oriented to person, place, and time.     Cranial Nerves: No cranial nerve deficit.    LABORATORY PANEL:  Male CBC Recent Labs  Lab 10/16/18 0704  WBC 4.8  HGB 13.0  HCT 40.2  PLT 183   ------------------------------------------------------------------------------------------------------------------ Chemistries  Recent Labs  Lab 10/15/18 1452 10/16/18 0704  NA 134* 134*  K 4.5 4.8  CL 103 104  CO2 23 22  GLUCOSE 140* 149*  BUN 20 26*  CREATININE 1.45* 1.40*  CALCIUM 9.2 9.3  AST 30  --   ALT 15  --   ALKPHOS 93  --   BILITOT 1.6*  --    RADIOLOGY:  No results found. ASSESSMENT AND PLAN:   1.  Asthma exacerbation: s/p IV magnesium. continue IV Solu-Medrol nebulizer treatments. 2.  Elevated troponin.  Could be demand ischemia versus NSTEMI.  cardiology planning for cath tomorrow.  Because of all the bronchospasm I will not give beta-blocker currently.  Give aspirin and heparin drip. pending echocardiogram. 3.  Hypertension on low-dose lisinopril. 4.  Depression on  Cymbalta 5.  Chronic pain on buprenorphine patch. 6.  Chronic kidney disease stage III.  continue gentle IV fluid hydration, monitor     All the records are reviewed and case discussed with Care Management/Social Worker. Management plans discussed with the patient, nursing and they are in agreement.  CODE STATUS: Full Code  TOTAL TIME TAKING CARE OF THIS PATIENT: 35 minutes.   More than 50% of the time was spent in counseling/coordination of care: YES  POSSIBLE D/C IN 1-2 DAYS,  DEPENDING ON CLINICAL CONDITION. And cardiac eval   Max Sane M.D on 10/16/2018 at 10:48 PM  Between 7am to 6pm - Pager - [phone]  After 6pm go to [email or web address] - Proofreader  [facility]  Office  [phone]  CC: Primary care physician; [name], MD  Note: This dictation was prepared with Dragon dictation along with smaller phrase technology. Any transcriptional errors that result from this process are unintentional.

## 2018-10-16 NOTE — Progress Notes (Signed)
Patient states pain in lower extremities. Notified hospitalist. Orders given for pain. PRN percocet given. Will monitor to end of shift.

## 2018-10-16 NOTE — Progress Notes (Signed)
*  PRELIMINARY RESULTS* Echocardiogram 2D Echocardiogram has been performed.  Matthew [name] 10/16/2018, 9:41 AM

## 2018-10-16 NOTE — ED Notes (Addendum)
Grey shirt, Tennis shoes, Black socks, black pants, underwear, Black watch, plain ring in belongings bag with patient. Brown cane with bag

## 2018-10-17 ENCOUNTER — Encounter
Admission: EM | Disposition: A | Discharge status: Home / Self Care | Payer: Self-pay | Source: Home / Self Care | Attending: Internal Medicine

## 2018-10-17 HISTORY — PX: LEFT HEART CATH AND CORONARY ANGIOGRAPHY: CATH118249

## 2018-10-17 LAB — CBC
HEMATOCRIT: 38.1 % — AB (ref 39.0–52.0)
Hemoglobin: 12.1 g/dL — ABNORMAL LOW (ref 13.0–17.0)
MCH: 32.4 pg (ref 26.0–34.0)
MCHC: 31.8 g/dL (ref 30.0–36.0)
MCV: 101.9 fL — AB (ref 80.0–100.0)
PLATELETS: 191 10*3/uL (ref 150–400)
RBC: 3.74 MIL/uL — ABNORMAL LOW (ref 4.22–5.81)
RDW: 12.9 % (ref 11.5–15.5)
WBC: 9.2 10*3/uL (ref 4.0–10.5)
nRBC: 0 % (ref 0.0–0.2)

## 2018-10-17 LAB — BASIC METABOLIC PANEL
Anion gap: 8 (ref 5–15)
BUN: 38 mg/dL — ABNORMAL HIGH (ref 8–23)
CO2: 23 mmol/L (ref 22–32)
Calcium: 9.3 mg/dL (ref 8.9–10.3)
Chloride: 107 mmol/L (ref 98–111)
Creatinine, Ser: 1.61 mg/dL — ABNORMAL HIGH (ref 0.61–1.24)
GFR calc Af Amer: 50 mL/min — ABNORMAL LOW (ref >60)
GFR calc non Af Amer: 43 mL/min — ABNORMAL LOW (ref >60)
Glucose, Bld: 124 mg/dL — ABNORMAL HIGH (ref 70–99)
Potassium: 4.9 mmol/L (ref 3.5–5.1)
Sodium: 138 mmol/L (ref 135–145)

## 2018-10-17 LAB — HIV ANTIBODY (ROUTINE TESTING W REFLEX): HIV Screen 4th Generation wRfx: NONREACTIVE

## 2018-10-17 LAB — ECHOCARDIOGRAM COMPLETE
HEIGHTINCHES: 74 in
WEIGHTICAEL: 4240 [oz_av]

## 2018-10-17 LAB — HEPARIN LEVEL (UNFRACTIONATED): Heparin Unfractionated: 0.32 IU/mL (ref 0.30–0.70)

## 2018-10-17 SURGERY — LEFT HEART CATH AND CORONARY ANGIOGRAPHY
Anesthesia: Moderate Sedation

## 2018-10-17 MED ORDER — VERAPAMIL HCL 2.5 MG/ML IV SOLN
INTRAVENOUS | Status: AC
  Filled 2018-10-17: qty 2

## 2018-10-17 MED ORDER — HEPARIN (PORCINE) IN NACL 1000-0.9 UT/500ML-% IV SOLN
INTRAVENOUS | Status: DC | PRN
  Administered 2018-10-17 (×2): 500 mL

## 2018-10-17 MED ORDER — IOPAMIDOL (ISOVUE-300) INJECTION 61%
INTRAVENOUS | Status: DC | PRN
  Administered 2018-10-17: 80 mL via INTRA_ARTERIAL

## 2018-10-17 MED ORDER — IPRATROPIUM-ALBUTEROL 0.5-2.5 (3) MG/3ML IN SOLN
RESPIRATORY_TRACT | Status: AC
  Filled 2018-10-17: qty 3

## 2018-10-17 MED ORDER — VERAPAMIL HCL 2.5 MG/ML IV SOLN
INTRAVENOUS | Status: DC | PRN
  Administered 2018-10-17: 2.5 mg via INTRA_ARTERIAL

## 2018-10-17 MED ORDER — HEPARIN (PORCINE) IN NACL 1000-0.9 UT/500ML-% IV SOLN
INTRAVENOUS | Status: AC
  Filled 2018-10-17: qty 1000

## 2018-10-17 MED ORDER — FENTANYL CITRATE (PF) 100 MCG/2ML IJ SOLN
INTRAMUSCULAR | Status: AC
  Filled 2018-10-17: qty 2

## 2018-10-17 MED ORDER — MIDAZOLAM HCL 2 MG/2ML IJ SOLN
INTRAMUSCULAR | Status: AC
  Filled 2018-10-17: qty 2

## 2018-10-17 MED ORDER — HEPARIN SODIUM (PORCINE) 1000 UNIT/ML IJ SOLN
INTRAMUSCULAR | Status: DC | PRN
  Administered 2018-10-17: 6000 [IU] via INTRAVENOUS

## 2018-10-17 MED ORDER — FENTANYL CITRATE (PF) 100 MCG/2ML IJ SOLN
INTRAMUSCULAR | Status: DC | PRN
  Administered 2018-10-17 (×2): 25 ug via INTRAVENOUS

## 2018-10-17 MED ORDER — HEPARIN SODIUM (PORCINE) 1000 UNIT/ML IJ SOLN
INTRAMUSCULAR | Status: AC
  Filled 2018-10-17: qty 1

## 2018-10-17 MED ORDER — ASPIRIN 81 MG PO CHEW
CHEWABLE_TABLET | ORAL | Status: AC
  Administered 2018-10-17: 81 mg via ORAL
  Filled 2018-10-17: qty 1

## 2018-10-17 MED ORDER — MIDAZOLAM HCL 2 MG/2ML IJ SOLN
INTRAMUSCULAR | Status: DC | PRN
  Administered 2018-10-17: 1 mg via INTRAVENOUS

## 2018-10-17 SURGICAL SUPPLY — 8 items
CATH INFINITI 5 FR JL3.5 (CATHETERS) ×3 IMPLANT
CATH INFINITI 5FR ANG PIGTAIL (CATHETERS) ×3 IMPLANT
CATH INFINITI JR4 5F (CATHETERS) ×3 IMPLANT
DEVICE RAD COMP TR BAND LRG (VASCULAR PRODUCTS) ×3 IMPLANT
GLIDESHEATH SLEND SS 6F .021 (SHEATH) ×3 IMPLANT
KIT MANI 3VAL PERCEP (MISCELLANEOUS) ×3 IMPLANT
PACK CARDIAC CATH (CUSTOM PROCEDURE TRAY) ×3 IMPLANT
WIRE ROSEN-J .035X260CM (WIRE) ×3 IMPLANT

## 2018-10-17 NOTE — Progress Notes (Signed)
Pt returned from cath lab s/p rt radial cath.  Pt will be medically managed.  Site rt wrist dry and intact, 2+ pulse bil.  Dressing inplace.  Pt instructed on limited use of rt arm, up on pillow.  Pt has some expiratory wheezes bil, will monitor. Denies need at this time. CB in reach, SR up x 2.

## 2018-10-17 NOTE — Progress Notes (Signed)
SATURATION QUALIFICATIONS: (This note is used to comply with regulatory documentation for home oxygen)  Patient Saturations on Room Air at Rest = 94%  Patient Saturations on Room Air while Ambulating = 90-95*%  Patient Saturations on 0 Liters of oxygen while Ambulating = 0%  Please briefly explain why patient needs home oxygen: Ambulated patient for the first time in room air. Patient's maintain Spo2 above 90% but is extremely short of breath and was wheezy. Patient walked to his room to the nurses station but needed to wheeled in a recliner to go back to his room, as he is extremely SOB. SpO2 after walking was still above 90% in room air. RN will continue to monitor.

## 2018-10-17 NOTE — Plan of Care (Signed)
  Problem: Education: Goal: Knowledge of General Education information will improve Description Including pain rating scale, medication(s)/side effects and non-pharmacologic comfort measures Outcome: Progressing   Problem: Health Behavior/Discharge Planning: Goal: Ability to manage health-related needs will improve Outcome: Progressing   Problem: Clinical Measurements: Goal: Ability to maintain clinical measurements within normal limits will improve Outcome: Progressing Goal: Will remain free from infection Outcome: Progressing Note:  Remains afebrile Goal: Diagnostic test results will improve Outcome: Progressing Note:  Troponins trending downward,  cardiac cath today, will medically manage Goal: Cardiovascular complication will be avoided Outcome: Progressing Note:  No arrhythmias over night    Problem: Education: Goal: Understanding of CV disease, CV risk reduction, and recovery process will improve Outcome: Progressing Goal: Individualized Educational Video(s) Outcome: Progressing   Problem: Activity: Goal: Ability to return to baseline activity level will improve Outcome: Progressing Note:  Will medically manage

## 2018-10-17 NOTE — Progress Notes (Signed)
[facility] [place] NAME: Matthew [name]    MR#:  [id number]  DATE OF BIRTH:  July 23, 1951  SUBJECTIVE:  CHIEF COMPLAINT:   Chief Complaint  Patient presents with  . Shortness of Breath  . Cough  SOB, wheezing REVIEW OF SYSTEMS:  Review of Systems  Constitutional: Negative for diaphoresis, fever, malaise/fatigue and weight loss.  HENT: Negative for ear discharge, ear pain, hearing loss, nosebleeds, sore throat and tinnitus.   Eyes: Negative for blurred vision and pain.  Respiratory: Positive for shortness of breath and wheezing. Negative for cough and hemoptysis.   Cardiovascular: Negative for chest pain, palpitations, orthopnea and leg swelling.  Gastrointestinal: Negative for abdominal pain, blood in stool, constipation, diarrhea, heartburn, nausea and vomiting.  Genitourinary: Negative for dysuria, frequency and urgency.  Musculoskeletal: Negative for back pain and myalgias.  Skin: Negative for itching and rash.  Neurological: Negative for dizziness, tingling, tremors, focal weakness, seizures, weakness and headaches.  Psychiatric/Behavioral: Negative for depression. The patient is not nervous/anxious.     DRUG ALLERGIES:   Allergies  Allergen Reactions  . Demerol [Meperidine Hcl]    VITALS:  Blood pressure (!) 155/73, pulse 75, temperature 97.6 F (36.4 C), temperature source Oral, resp. rate 18, height 6\' 2"  (1.88 m), weight 129.9 kg, SpO2 97 %. PHYSICAL EXAMINATION:  Physical Exam HENT:     Head: Normocephalic and atraumatic.  Eyes:     Conjunctiva/sclera: Conjunctivae normal.     Pupils: Pupils are equal, round, and reactive to light.  Neck:     Musculoskeletal: Normal range of motion and neck supple.     Thyroid: No thyromegaly.     Trachea: No tracheal deviation.  Cardiovascular:     Rate and Rhythm: Normal rate and regular rhythm.     Heart sounds: Normal heart sounds.  Pulmonary:     Effort: Pulmonary effort is  normal. No respiratory distress.     Breath sounds: Wheezing present.  Chest:     Chest wall: No tenderness.  Abdominal:     General: Bowel sounds are normal. There is no distension.     Palpations: Abdomen is soft.     Tenderness: There is no abdominal tenderness.  Musculoskeletal: Normal range of motion.  Skin:    General: Skin is warm and dry.     Findings: No rash.  Neurological:     Mental Status: He is alert and oriented to person, place, and time.     Cranial Nerves: No cranial nerve deficit.    LABORATORY PANEL:  Male CBC Recent Labs  Lab 10/17/18 0443  WBC 9.2  HGB 12.1*  HCT 38.1*  PLT 191   ------------------------------------------------------------------------------------------------------------------ Chemistries  Recent Labs  Lab 10/15/18 1452  10/17/18 0443  NA 134*   < > 138  K 4.5   < > 4.9  CL 103   < > 107  CO2 23   < > 23  GLUCOSE 140*   < > 124*  BUN 20   < > 38*  CREATININE 1.45*   < > 1.61*  CALCIUM 9.2   < > 9.3  AST 30  --   --   ALT 15  --   --   ALKPHOS 93  --   --   BILITOT 1.6*  --   --    < > = values in this interval not displayed.   RADIOLOGY:  No results found. ASSESSMENT AND PLAN:   1.  Asthma exacerbation: continue IV Solu-Medrol nebulizer treatments. 2.  Elevated troponin. Due to demand ischemia. NSTEMI ruled out.  s/p cath on 3/5 showing normal coronaries and LV function  Because of all the bronchospasm I will not give beta-blocker currently.  Give aspirin, stop heparin drip. pending echocardiogram. 3.  Hypertension on low-dose lisinopril. 4.  Depression on Cymbalta 5.  Chronic pain on buprenorphine patch. 6.  Chronic kidney disease stage III.  stop IVF, monitor   ambulate  All the records are reviewed and case discussed with Care Management/Social Worker. Management plans discussed with the patient, nursing and they are in agreement.  CODE STATUS: Full Code  TOTAL TIME TAKING CARE OF THIS PATIENT: 35 minutes.    More than 50% of the time was spent in counseling/coordination of care: YES  POSSIBLE D/C IN 1 DAYS, DEPENDING ON CLINICAL CONDITION. And cardiac eval   Max Sane M.D on 10/17/2018 at 3:29 PM  Between 7am to 6pm - Pager - [phone]  After 6pm go to [email or web address] - Proofreader  [facility]  Office  [phone]  CC: Primary care physician; [name], MD  Note: This dictation was prepared with Dragon dictation along with smaller phrase technology. Any transcriptional errors that result from this process are unintentional.

## 2018-10-17 NOTE — Progress Notes (Signed)
ANTICOAGULATION CONSULT NOTE - Initial Consult  Pharmacy Consult for heparin Indication: chest pain/ACS  Allergies  Allergen Reactions  . Demerol [Meperidine Hcl]     Patient Measurements: Height: 6\' 2"  (188 cm) Weight: 286 lb 6.4 oz (129.9 kg) IBW/kg (Calculated) : 82.2 Heparin Dosing Weight: 107.99 kg   Vital Signs: Temp: 97.5 F (36.4 C) (03/05 0807) Temp Source: Oral (03/05 0807) BP: 138/76 (03/05 0807) Pulse Rate: 56 (03/05 0807)  Labs: Recent Labs    10/15/18 1452 10/15/18 1714 10/15/18 2230 10/16/18 0704 10/17/18 0443  HGB 14.2  --   --  13.0 12.1*  HCT 43.6  --   --  40.2 38.1*  PLT 191  --   --  183 191  APTT  --  34  --   --   --   LABPROT  --  13.3  --   --   --   INR  --  1.0  --   --   --   HEPARINUNFRC  --   --  0.43 0.39 0.32  CREATININE 1.45*  --   --  1.40* 1.61*  TROPONINI 2.49*  --  1.51* 0.85*  --     Estimated Creatinine Clearance: 62.9 mL/min (A) (by C-G formula based on SCr of 1.61 mg/dL (H)).   Medical History: Past Medical History:  Diagnosis Date  . Asthma   . Edema   . Hip dislocation, bilateral ([place])   . Hypertension     Medications:  Medications Prior to Admission  Medication Sig Dispense Refill Last Dose  . Buprenorphine 15 MCG/HR PTWK Place 1 patch onto the skin once a week.   Past Week at Unknown time  . cloNIDine (CATAPRES - DOSED IN MG/24 HR) 0.2 mg/24hr patch Place 1 patch onto the skin once a week.   Past Week at Unknown time  . DULoxetine (CYMBALTA) 60 MG capsule Take 60 mg by mouth 2 (two) times daily.   10/15/2018 at 1000  . lisinopril (PRINIVIL,ZESTRIL) 10 MG tablet Take 10 mg by mouth daily.   10/15/2018 at 1000  . SEREVENT DISKUS 50 MCG/DOSE diskus inhaler Inhale 1 puff into the lungs 2 (two) times daily.   10/15/2018 at 1000  . tiZANidine (ZANAFLEX) 4 MG tablet Take 4 mg by mouth 3 (three) times daily.   10/15/2018 at 1000  . famotidine (PEPCID) 40 MG tablet Take 1 tablet (40 mg total) by mouth every evening. (Patient  not taking: Reported on 10/15/2018) 14 tablet 0 Not Taking at Unknown time  . metoCLOPramide (REGLAN) 10 MG tablet Take 1 tablet (10 mg total) by mouth every 8 (eight) hours as needed. (Patient not taking: Reported on 10/15/2018) 20 tablet 0 Not Taking at Unknown time  . VENTOLIN HFA 108 (90 Base) MCG/ACT inhaler Inhale 2 puffs into the lungs every 6 (six) hours as needed for wheezing.   prn at prn   Scheduled:  . aspirin      . aspirin  81 mg Oral Pre-Cath  . [MAR Hold] aspirin EC  81 mg Oral Daily  . [MAR Hold] budesonide (PULMICORT) nebulizer solution  0.5 mg Nebulization BID  . [MAR Hold] DULoxetine  60 mg Oral BID  . [MAR Hold] Influenza vac split quadrivalent PF  0.5 mL Intramuscular Tomorrow-1000  . [MAR Hold] ipratropium-albuterol  3 mL Nebulization Q6H  . [MAR Hold] lisinopril  5 mg Oral Daily  . [MAR Hold] methylPREDNISolone (SOLU-MEDROL) injection  40 mg Intravenous Daily  . sodium chloride flush  3 mL Intravenous Q12H  . [MAR Hold] tiZANidine  4 mg Oral TID   Infusions:  . sodium chloride 50 mL/hr at 10/16/18 2138  . sodium chloride    . sodium chloride    . heparin 1,350 Units/hr (10/17/18 0344)   PRN:   Assessment: Pharmacy was consulted to start heparin. No note of DOAC PTA.   Goal of Therapy:  Heparin level 0.3-0.7 units/ml Monitor platelets by anticoagulation protocol: Yes   Plan:  Give 4000 units bolus x 1 Start heparin infusion at 1350 units/hr Check anti-Xa level in 6 hours and daily while on heparin Continue to monitor H&H and platelets   3/3 PM HL 0.43 (therapeutic x 1) 3/4 AM HL 0.39 (therapeuic x 2) 3/5 AM HL 0.32 Continue current regimen (1350 units/hr).   Recheck heparin level and CBC with tomorrow AM labs.  Lu Duffel, PharmD, BCPS Clinical Pharmacist  10/17/2018,8:37 AM

## 2018-10-17 NOTE — Progress Notes (Signed)
To cath lab via bed.

## 2018-10-18 LAB — CBC
HCT: 41 % (ref 39.0–52.0)
Hemoglobin: 12.6 g/dL — ABNORMAL LOW (ref 13.0–17.0)
MCH: 31.8 pg (ref 26.0–34.0)
MCHC: 30.7 g/dL (ref 30.0–36.0)
MCV: 103.5 fL — ABNORMAL HIGH (ref 80.0–100.0)
Platelets: 191 10*3/uL (ref 150–400)
RBC: 3.96 MIL/uL — ABNORMAL LOW (ref 4.22–5.81)
RDW: 12.6 % (ref 11.5–15.5)
WBC: 7.4 10*3/uL (ref 4.0–10.5)
nRBC: 0 % (ref 0.0–0.2)

## 2018-10-18 LAB — BASIC METABOLIC PANEL
Anion gap: 6 (ref 5–15)
BUN: 36 mg/dL — ABNORMAL HIGH (ref 8–23)
CO2: 24 mmol/L (ref 22–32)
Calcium: 9.3 mg/dL (ref 8.9–10.3)
Chloride: 108 mmol/L (ref 98–111)
Creatinine, Ser: 1.67 mg/dL — ABNORMAL HIGH (ref 0.61–1.24)
GFR calc Af Amer: 48 mL/min — ABNORMAL LOW (ref >60)
GFR calc non Af Amer: 41 mL/min — ABNORMAL LOW (ref >60)
Glucose, Bld: 109 mg/dL — ABNORMAL HIGH (ref 70–99)
Potassium: 4.7 mmol/L (ref 3.5–5.1)
Sodium: 138 mmol/L (ref 135–145)

## 2018-10-18 MED ORDER — PREDNISONE 10 MG (21) PO TBPK: ORAL_TABLET | ORAL | 0 refills | Status: DC

## 2018-10-18 NOTE — Progress Notes (Signed)
SATURATION QUALIFICATIONS: (This note is used to comply with regulatory documentation for home oxygen)  Patient Saturations on Room Air at Rest = 94%  Patient Saturations on Room Air while Ambulating = 82-94%  Patient Saturations on 0 Liters of oxygen while Ambulating = 0%  Please briefly explain why patient needs home oxygen: Patient is experiencing shortness of breath while ambulating and is wheezing on ascultation. Patient ambulated halfway around nurses station and sat in wheelchair to catch his breath. He was able to resume and continue walking back to his room. Remained above 90% on room air walking back to room. Will continue to monitor.

## 2018-10-18 NOTE — Discharge Instructions (Signed)
Bronchospasm, Adult    Bronchospasm is when airways in the lungs get smaller. When this happens, it can be hard to breathe. You may cough. You may also make a whistling sound when you breathe (wheeze).  Follow these instructions at home:  Medicines   Take over-the-counter and prescription medicines only as told by your doctor.   If you need to use an inhaler or nebulizer to take your medicine, ask your doctor how to use it.   If you were given a spacer, always use it with your inhaler.  Lifestyle   Change your heating and air conditioning filter. Do this at least once a month.   Try not to use fireplaces and wood stoves.   Do not  smoke. Do not  allow smoking in your home.   Try not to use things that have a strong smell, like perfume.   Get rid of pests (such as roaches and mice) and their poop.   Remove any mold from your home.   Keep your house clean. Get rid of dust.   Use cleaning products that have no smell.   Replace carpet with wood, tile, or vinyl flooring.   Use allergy-proof pillows, mattress covers, and box spring covers.   Wash bed sheets and blankets every week. Use hot water. Dry them in a dryer.   Use blankets that are made of polyester or cotton.   Wash your hands often.   Keep pets out of your bedroom.   When you exercise, try not to breathe in cold air.  General instructions   Have a plan for getting medical care. Know these things:  ? When to call your doctor.  ? When to call local emergency services (911 in the [place]  ? Where to go in an emergency.   Stay up to date on your shots (immunizations).   When you have an episode:  ? Stay calm.  ? Relax.  ? Breathe slowly.  Contact a doctor if:   Your muscles ache.   Your chest hurts.   The color of the mucus you cough up (sputum) changes from clear or [place] to yellow, green, gray, or bloody.   The mucus you cough up gets thicker.   You have a fever.  Get help right away if:   The whistling sound gets worse, even after you  take your medicines.   Your coughing gets worse.   You find it even harder to breathe.   Your chest hurts very much.  Summary   Bronchospasm is when airways in the lungs get smaller.   When this happens, it can be hard to breathe. You may cough. You may also make a whistling sound when you breathe.   Stay away from things that cause you to have episodes. These include smoke or dust.  This information is not intended to replace advice given to you by your health care provider. Make sure you discuss any questions you have with your health care provider.  Document Released: 05/28/2009 Document Revised: 08/03/2016 Document Reviewed: 08/03/2016  Elsevier Interactive Patient Education  2019 [employer]

## 2018-10-18 NOTE — Care Management Important Message (Signed)
Copy of signed Medicare IM left with patient in room. 

## 2018-10-18 NOTE — Care Management Note (Signed)
Case Management Note  Patient Details  Name: Matthew [name] MRN: [id number] Date of Birth: 10/16/1950  Subjective/Objective:        Patient is from home.  He lives with church friends.  He will be staying with his wife who he is separated from at [place].  Her address is [place] [address].  Referral to [facility] for RN, PT, COPD protocol.  [name] with advanced is aware of address and accepted [facility] services.  He is current with his PCP.  Obtains medications without difficulty.   He has a walker and a cane if needed.  No further needs identified at this time.           Action/Plan:   Expected Discharge Date:  10/18/18               Expected Discharge Plan:  [place]  In-House Referral:     Discharge planning Services  CM Consult  Post Acute Care Choice:  Home Health Choice offered to:  Patient  DME Arranged:    DME Agency:     HH Arranged:  RN, PT HH Agency:  [facility]  Status of Service:  Completed, signed off  If discussed at [place] of Stay Meetings, dates discussed:    Additional Comments:  Elza Rafter, RN 10/18/2018, 2:22 PM

## 2018-10-18 NOTE — Progress Notes (Signed)
Matthew [name] to be D/C'd Home per MD order.  Discussed prescriptions and follow up appointments with the patient. Prescriptions were e-prescribed, medication list explained in detail. Pt verbalized understanding.  Allergies as of 10/18/2018      Reactions   Demerol [meperidine Hcl]       Medication List    STOP taking these medications   famotidine 40 MG tablet Commonly known as:  PEPCID   metoCLOPramide 10 MG tablet Commonly known as:  REGLAN     TAKE these medications   Buprenorphine 15 MCG/HR Ptwk Place 1 patch onto the skin once a week.   cloNIDine 0.2 mg/24hr patch Commonly known as:  CATAPRES - Dosed in mg/24 hr Place 1 patch onto the skin once a week.   DULoxetine 60 MG capsule Commonly known as:  CYMBALTA Take 60 mg by mouth 2 (two) times daily.   lisinopril 10 MG tablet Commonly known as:  PRINIVIL,ZESTRIL Take 10 mg by mouth daily.   predniSONE 10 MG (21) Tbpk tablet Commonly known as:  STERAPRED UNI-PAK 21 TAB Start 60 mg po daily, taper 10 mg daily until done   Serevent Diskus 50 MCG/DOSE diskus inhaler Generic drug:  salmeterol Inhale 1 puff into the lungs 2 (two) times daily.   tiZANidine 4 MG tablet Commonly known as:  ZANAFLEX Take 4 mg by mouth 3 (three) times daily.   Ventolin HFA 108 (90 Base) MCG/ACT inhaler Generic drug:  albuterol Inhale 2 puffs into the lungs every 6 (six) hours as needed for wheezing.       Vitals:   10/18/18 1108 10/18/18 1350  BP:    Pulse:    Resp:    Temp:    SpO2: 96% 93%    Tele box removed and returned. Skin clean, dry and intact without evidence of skin break down, no evidence of skin tears noted. IV catheter discontinued intact. Site without signs and symptoms of complications. Dressing and pressure applied. Pt denies pain at this time. No complaints noted.  An After Visit Summary was printed and given to the patient. Patient escorted via Matthew [name], and D/C home via Matthew [name] taxi.  Matthew [name]

## 2018-10-18 NOTE — Consult Note (Signed)
Pulmonary Medicine          Date: 10/18/2018,   MRN# [id number] Matthew [name] [facility] 07-Feb-1951     AdmissionWeight: 120.2 kg                 CurrentWeight: 129.9 kg      CHIEF COMPLAINT:   Wheezing shortness of breath and dyspnea on exertion.   HISTORY OF PRESENT ILLNESS   This is a pleasant 68 year old male he has a history of asthma which has been poorly controlled.  I was asked to see him by Dr. Manuella Ghazi for severe shortness of breath and chest discomfort.  Patient has previous medical history as below including moderate persistent asthma.  He has been on Serevent in the past however has not been compliant with this therapy.  He reports that over the past week he has felt shortness of breath and recently had URI symptoms including myalgia and malaise and subjective fevers he felt that he had contracted influenza and had finally decided to come in this past Monday on admission he was noted to have troponin anemia and was evaluated by cardiology including a left heart cath which was unremarkable.  He has continued to wheeze since admission but has improved with nebulizer therapy which has been around-the-clock.   PAST MEDICAL HISTORY   Past Medical History:  Diagnosis Date  . Asthma   . Edema   . Hip dislocation, bilateral ([place])   . Hypertension      SURGICAL HISTORY   Past Surgical History:  Procedure Laterality Date  . ABDOMINAL SURGERY    . CHOLECYSTECTOMY    . COLOSTOMY    . COLOSTOMY TAKEDOWN    . HERNIA REPAIR    . LEFT HEART CATH AND CORONARY ANGIOGRAPHY N/A 10/17/2018   Procedure: LEFT HEART CATH AND CORONARY ANGIOGRAPHY with possible PCI and stent;  Surgeon: Yolonda Kida, MD;  Location: [facility];  Service: Cardiovascular;  Laterality: N/A;  . TOTAL HIP ARTHROPLASTY Bilateral      FAMILY HISTORY   Family History  Problem Relation Age of Onset  . COPD Mother   . Melanoma Father      SOCIAL HISTORY   Social History   Tobacco Use    . Smoking status: Never Smoker  . Smokeless tobacco: Never Used  Substance Use Topics  . Alcohol use: Not Currently  . Drug use: Not on file     MEDICATIONS    Home Medication:  Current Outpatient Rx  . Order #: [id number] Class: Normal    Current Medication:  Current Facility-Administered Medications:  .  acetaminophen (TYLENOL) tablet 650 mg, 650 mg, Oral, Q6H PRN **OR** acetaminophen (TYLENOL) suppository 650 mg, 650 mg, Rectal, Q6H PRN, Wieting, Richard, MD .  aspirin EC tablet 81 mg, 81 mg, Oral, Daily, Leslye Peer, Richard, MD, 81 mg at 10/18/18 0900 .  budesonide (PULMICORT) nebulizer solution 0.5 mg, 0.5 mg, Nebulization, BID, Leslye Peer, Richard, MD, 0.5 mg at 10/18/18 0816 .  DULoxetine (CYMBALTA) DR capsule 60 mg, 60 mg, Oral, BID, Leslye Peer, Richard, MD, 60 mg at 10/18/18 0900 .  Influenza vac split quadrivalent PF (FLUZONE HIGH-DOSE) injection 0.5 mL, 0.5 mL, Intramuscular, Tomorrow-1000, Shah, Vipul, MD .  ipratropium-albuterol (DUONEB) 0.5-2.5 (3) MG/3ML nebulizer solution 3 mL, 3 mL, Nebulization, Q6H, Wieting, Richard, MD, 3 mL at 10/18/18 0814 .  lisinopril (PRINIVIL,ZESTRIL) tablet 5 mg, 5 mg, Oral, Daily, Wieting, Richard, MD, 5 mg at 10/18/18 0900 .  methylPREDNISolone sodium succinate (SOLU-MEDROL) 40  mg/mL injection 40 mg, 40 mg, Intravenous, Daily, Leslye Peer, Richard, MD, 40 mg at 10/18/18 0900 .  oxyCODONE-acetaminophen (PERCOCET/ROXICET) 5-325 MG per tablet 1 tablet, 1 tablet, Oral, Q6H PRN, Harrie Foreman, MD, 1 tablet at 10/18/18 0901 .  tiZANidine (ZANAFLEX) tablet 4 mg, 4 mg, Oral, TID, Lance Coon, MD, 4 mg at 10/18/18 0900    ALLERGIES   Demerol [meperidine hcl]     REVIEW OF SYSTEMS    Review of Systems:  Gen:  Denies  fever, sweats, chills weigh loss  HEENT: Denies blurred vision, double vision, ear pain, eye pain, hearing loss, nose bleeds, sore throat Cardiac:  No dizziness, chest pain or heaviness, chest tightness,edema Resp:   Denies  cough or sputum porduction, shortness of breath,wheezing, hemoptysis,  Gi: Denies swallowing difficulty, stomach pain, nausea or vomiting, diarrhea, constipation, bowel incontinence Gu:  Denies bladder incontinence, burning urine Ext:   Denies Joint pain, stiffness or swelling Skin: Denies  skin rash, easy bruising or bleeding or hives Endoc:  Denies polyuria, polydipsia , polyphagia or weight change Psych:   Denies depression, insomnia or hallucinations   Other:  All other systems negative   VS: BP (!) 147/87 (BP Location: Left Arm)   Pulse (!) 59   Temp 97.6 F (36.4 C) (Oral)   Resp 19   Ht 6\' 2"  (1.88 m)   Wt 129.9 kg   SpO2 96%   BMI 36.77 kg/m      PHYSICAL EXAM    GENERAL:NAD, no fevers, chills, no weakness no fatigue HEAD: Normocephalic, atraumatic.  EYES: Pupils equal, round, reactive to light. Extraocular muscles intact. No scleral icterus.  MOUTH: Moist mucosal membrane. Dentition intact. No abscess noted.  EAR, NOSE, THROAT: Clear without exudates. No external lesions.  NECK: Supple. No thyromegaly. No nodules. No JVD.  PULMONARY: Bilateral expiratory wheezing CARDIOVASCULAR: S1 and S2. Regular rate and rhythm. No murmurs, rubs, or gallops. No edema. Pedal pulses 2+ bilaterally.  GASTROINTESTINAL: Soft, nontender, nondistended. No masses. Positive bowel sounds. No hepatosplenomegaly.  MUSCULOSKELETAL: No swelling, clubbing, or edema. Range of motion full in all extremities.  NEUROLOGIC: Cranial nerves II through XII are intact. No gross focal neurological deficits. Sensation intact. Reflexes intact.  SKIN: No ulceration, lesions, rashes, or cyanosis. Skin warm and dry. Turgor intact.  PSYCHIATRIC: Mood, affect within normal limits. The patient is awake, alert and oriented x 3. Insight, judgment intact.       IMAGING    Dg Chest 2 View  Result Date: 10/15/2018 CLINICAL DATA:  Dyspnea and cough since Sunday. EXAM: CHEST - 2 VIEW COMPARISON:  12/31/2017  FINDINGS: Stable normal heart size and mediastinal contours. Slight pulmonary hyperinflation consistent with COPD with minimal bibasilar atelectasis, left greater than right. No acute pulmonary consolidation nor overt pulmonary edema. 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 Stable degenerative change along the thoracic spine. IMPRESSION: Pulmonary hyperinflation, chronic in appearance in compatible with COPD with minimal bibasilar atelectasis. No active pulmonary disease. Electronically Signed   By: Ashley Royalty M.D.   On: 10/15/2018 15:06   Ct Angio Chest Pe W And/or Wo Contrast  Result Date: 10/15/2018 CLINICAL DATA:  Dyspnea, cough and congestion since Sunday. EXAM: CT ANGIOGRAPHY CHEST WITH CONTRAST TECHNIQUE: Multidetector CT imaging of the chest was performed using the standard protocol during bolus administration of intravenous contrast. Multiplanar CT image reconstructions and MIPs were obtained to evaluate the vascular anatomy. CONTRAST:  47mL OMNIPAQUE IOHEXOL 350 MG/ML SOLN COMPARISON:  CXR 10/15/2018, chest CT 09/21/2012 FINDINGS:  Cardiovascular: Satisfactory opacification of the pulmonary arteries to the segmental level. No evidence of pulmonary embolism. Normal heart size. No pericardial effusion coronary arteriosclerosis is noted of the LAD and left circumflex. Conventional branch pattern of the great vessels with atherosclerosis along the proximal right brachiocephalic. Nonaneurysmal, minimally atherosclerotic thoracic aorta without dissection. Mediastinum/Nodes: No enlarged mediastinal, hilar, or axillary lymph nodes. Thyroid gland, trachea, and esophagus demonstrate no significant findings. Lungs/Pleura: 4 mm nodular density along the left major fissure is more often related to perifissural lymph nodes. Subpleural focus of ground-glass opacity in the left lower lobe, series 6/60 9 May reflect a focus of minimal atelectasis or inflammation, measuring 11 x 6 mm. No  effusion or pneumothorax. A right lower lobe pulmonary bleb is identified. Upper Abdomen: No acute abnormality. 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 No acute nor suspicious osseous lesions. Review of the MIP images confirms the above findings. IMPRESSION: 1. No acute pulmonary embolus. 2. 4 mm perifissural lymph node on the left. Faint ground-glass opacity in the left lower lobe measuring 11 x 6 mm is noted more likely to represent postinfectious or inflammatory change versus atelectasis. Initial follow-up with CT at 6-12 months is recommended to confirm persistence. If persistent, repeat CT is recommended every 2 years until 5 years of stability has been established. This recommendation follows the consensus statement: Guidelines for Management of Incidental Pulmonary Nodules Detected on CT Images: From the Fleischner Society 2017; Radiology 2017; 284:228-243. Aortic Atherosclerosis (ICD10-I70.0). Electronically Signed   By: Ashley Royalty M.D.   On: 10/15/2018 16:19      ASSESSMENT/PLAN   Dyspnea on exertion, wheezing and shortness of breath. Likely due to uncontrolled moderate persistent asthma -Patient has significantly improved post continuous nebulizer therapy -As post cardiac evaluation/unremarkable -Would recommend close follow-up on outpatient with pulmonary-I will make office appointment for patient.  I have discussed this with patient -Please discharge with Qvar to be used daily as well as Combivent 4 times daily as needed -Patient is cleared for DC from pulmonary perspective     Thank you for allowing me to participate in the care of this patient.    Patient/Family are satisfied with care plan and all questions have been answered.  This document was prepared using Dragon voice recognition software and may include unintentional dictation errors.     Ottie Glazier, M.D.  Division of [facility]

## 2018-10-22 DIAGNOSIS — J4541 Moderate persistent asthma with (acute) exacerbation: Secondary | ICD-10-CM | POA: Diagnosis not present

## 2018-10-22 DIAGNOSIS — M549 Dorsalgia, unspecified: Secondary | ICD-10-CM | POA: Diagnosis not present

## 2018-10-22 DIAGNOSIS — Z7952 Long term (current) use of systemic steroids: Secondary | ICD-10-CM | POA: Diagnosis not present

## 2018-10-22 DIAGNOSIS — G8929 Other chronic pain: Secondary | ICD-10-CM | POA: Diagnosis not present

## 2018-10-22 DIAGNOSIS — Z96643 Presence of artificial hip joint, bilateral: Secondary | ICD-10-CM | POA: Diagnosis not present

## 2018-10-24 NOTE — Discharge Summary (Signed)
[facility] [place] NAME: Matthew [name]    MR#:  [id number]  DATE OF BIRTH:  01-07-1951  DATE OF ADMISSION:  10/15/2018   ADMITTING PHYSICIAN: Loletha Grayer, MD  DATE OF DISCHARGE: 10/18/2018  6:31 PM  PRIMARY CARE PHYSICIAN: Tracie Harrier, MD   ADMISSION DIAGNOSIS:  Shortness of breath [R06.02] Elevated troponin [R79.89] DISCHARGE DIAGNOSIS:  Active Problems:   Acute asthma exacerbation  SECONDARY DIAGNOSIS:   Past Medical History:  Diagnosis Date  . Asthma   . Edema   . Hip dislocation, bilateral ([place])   . Hypertension    HOSPITAL COURSE:   1. Asthma exacerbation: improved on steroids and nebs 2. Elevated troponin. Due to demand ischemia. NSTEMI ruled out.  s/p cath on 3/5 showing normal coronaries and LV function  3. Hypertension on low-dose lisinopril. 4. Depression on Cymbalta 5. Chronic pain on buprenorphine patch. 6. Chronic kidney disease stage III - at baseline  DISCHARGE CONDITIONS:  stable CONSULTS OBTAINED:  Treatment Team:  Ottie Glazier, MD DRUG ALLERGIES:   Allergies  Allergen Reactions  . Demerol [Meperidine Hcl]    DISCHARGE MEDICATIONS:   Allergies as of 10/18/2018      Reactions   Demerol [meperidine Hcl]       Medication List    STOP taking these medications   famotidine 40 MG tablet Commonly known as:  PEPCID   metoCLOPramide 10 MG tablet Commonly known as:  REGLAN     TAKE these medications   Buprenorphine 15 MCG/HR Ptwk Place 1 patch onto the skin once a week.   cloNIDine 0.2 mg/24hr patch Commonly known as:  CATAPRES - Dosed in mg/24 hr Place 1 patch onto the skin once a week.   DULoxetine 60 MG capsule Commonly known as:  CYMBALTA Take 60 mg by mouth 2 (two) times daily.   lisinopril 10 MG tablet Commonly known as:  PRINIVIL,ZESTRIL Take 10 mg by mouth daily.   predniSONE 10 MG (21) Tbpk tablet Commonly known as:  STERAPRED UNI-PAK 21 TAB Start 60 mg po  daily, taper 10 mg daily until done   Serevent Diskus 50 MCG/DOSE diskus inhaler Generic drug:  salmeterol Inhale 1 puff into the lungs 2 (two) times daily.   tiZANidine 4 MG tablet Commonly known as:  ZANAFLEX Take 4 mg by mouth 3 (three) times daily.   Ventolin HFA 108 (90 Base) MCG/ACT inhaler Generic drug:  albuterol Inhale 2 puffs into the lungs every 6 (six) hours as needed for wheezing.      DISCHARGE INSTRUCTIONS:   DIET:  Regular diet DISCHARGE CONDITION:  Good ACTIVITY:  Activity as tolerated OXYGEN:  Home Oxygen: No.  Oxygen Delivery: room air DISCHARGE LOCATION:  home   If you experience worsening of your admission symptoms, develop shortness of breath, life threatening emergency, suicidal or homicidal thoughts you must seek medical attention immediately by calling 911 or calling your MD immediately  if symptoms less severe.  You Must read complete instructions/literature along with all the possible adverse reactions/side effects for all the Medicines you take and that have been prescribed to you. Take any new Medicines after you have completely understood and accpet all the possible adverse reactions/side effects.   Please note  You were cared for by a hospitalist during your hospital stay. If you have any questions about your discharge medications or the care you received while you were in the hospital after you are discharged, you can call the unit and  asked to speak with the hospitalist on call if the hospitalist that took care of you is not available. Once you are discharged, your primary care physician will handle any further medical issues. Please note that NO REFILLS for any discharge medications will be authorized once you are discharged, as it is imperative that you return to your primary care physician (or establish a relationship with a primary care physician if you do not have one) for your aftercare needs so that they can reassess your need for  medications and monitor your lab values.    On the day of Discharge:  VITAL SIGNS:  Blood pressure (!) 147/87, pulse (!) 59, temperature 97.6 F (36.4 C), temperature source Oral, resp. rate 19, height 6\' 2"  (1.88 m), weight 129.9 kg, SpO2 93 %. PHYSICAL EXAMINATION:  GENERAL:  68 y.o.-year-old patient lying in the bed with no acute distress.  EYES: Pupils equal, round, reactive to light and accommodation. No scleral icterus. Extraocular muscles intact.  HEENT: Head atraumatic, normocephalic. Oropharynx and nasopharynx clear.  NECK:  Supple, no jugular venous distention. No thyroid enlargement, no tenderness.  LUNGS: Normal breath sounds bilaterally, no wheezing, rales,rhonchi or crepitation. No use of accessory muscles of respiration.  CARDIOVASCULAR: S1, S2 normal. No murmurs, rubs, or gallops.  ABDOMEN: Soft, non-tender, non-distended. Bowel sounds present. No organomegaly or mass.  EXTREMITIES: No pedal edema, cyanosis, or clubbing.  NEUROLOGIC: Cranial nerves II through XII are intact. Muscle strength 5/5 in all extremities. Sensation intact. Gait not checked.  PSYCHIATRIC: The patient is alert and oriented x 3.  SKIN: No obvious rash, lesion, or ulcer.  DATA REVIEW:   CBC Recent Labs  Lab 10/18/18 0348  WBC 7.4  HGB 12.6*  HCT 41.0  PLT 191    Chemistries  Recent Labs  Lab 10/18/18 0348  NA 138  K 4.7  CL 108  CO2 24  GLUCOSE 109*  BUN 36*  CREATININE 1.67*  CALCIUM 9.3    Follow-up Information    Hande, Cherlyn Labella, MD In 5 days.   Specialty:  Internal Medicine Why:  PATIENT NEEDS TO SEE HIS PCP FIRST Contact information: [address] [place] [address] [phone]        Ottie Glazier, MD. Schedule an appointment as soon as possible for a visit in 1 week.   Specialty:  Pulmonary Disease Why:  OFFICE WILL CALL PATIENT TO SET UP APPOINTMENT Contact information: [place] [address]  [phone]           Management plans discussed with the patient, family and they are in agreement.  CODE STATUS: Prior   TOTAL TIME TAKING CARE OF THIS PATIENT: 45 minutes.    Max Sane M.D on 10/24/2018 at 2:30 PM  Between 7am to 6pm - Pager - [phone]  After 6pm go to [email or web address] - Proofreader  [facility]  Office  [phone]  CC: Primary care physician; Tracie Harrier, MD   Note: This dictation was prepared with Dragon dictation along with smaller phrase technology. Any transcriptional errors that result from this process are unintentional.

## 2018-10-31 DIAGNOSIS — G8929 Other chronic pain: Secondary | ICD-10-CM | POA: Diagnosis not present

## 2018-10-31 DIAGNOSIS — Z96643 Presence of artificial hip joint, bilateral: Secondary | ICD-10-CM | POA: Diagnosis not present

## 2018-10-31 DIAGNOSIS — M549 Dorsalgia, unspecified: Secondary | ICD-10-CM | POA: Diagnosis not present

## 2018-10-31 DIAGNOSIS — J4541 Moderate persistent asthma with (acute) exacerbation: Secondary | ICD-10-CM | POA: Diagnosis not present

## 2018-10-31 DIAGNOSIS — Z7952 Long term (current) use of systemic steroids: Secondary | ICD-10-CM | POA: Diagnosis not present

## 2018-11-01 DIAGNOSIS — Z7952 Long term (current) use of systemic steroids: Secondary | ICD-10-CM | POA: Diagnosis not present

## 2018-11-01 DIAGNOSIS — Z96643 Presence of artificial hip joint, bilateral: Secondary | ICD-10-CM | POA: Diagnosis not present

## 2018-11-01 DIAGNOSIS — J4541 Moderate persistent asthma with (acute) exacerbation: Secondary | ICD-10-CM | POA: Diagnosis not present

## 2018-11-01 DIAGNOSIS — G8929 Other chronic pain: Secondary | ICD-10-CM | POA: Diagnosis not present

## 2018-11-01 DIAGNOSIS — M549 Dorsalgia, unspecified: Secondary | ICD-10-CM | POA: Diagnosis not present

## 2018-11-05 DIAGNOSIS — M76822 Posterior tibial tendinitis, left leg: Secondary | ICD-10-CM | POA: Diagnosis not present

## 2018-11-05 DIAGNOSIS — M722 Plantar fascial fibromatosis: Secondary | ICD-10-CM | POA: Diagnosis not present

## 2018-11-06 DIAGNOSIS — J4541 Moderate persistent asthma with (acute) exacerbation: Secondary | ICD-10-CM | POA: Diagnosis not present

## 2018-11-06 DIAGNOSIS — G8929 Other chronic pain: Secondary | ICD-10-CM | POA: Diagnosis not present

## 2018-11-06 DIAGNOSIS — Z96643 Presence of artificial hip joint, bilateral: Secondary | ICD-10-CM | POA: Diagnosis not present

## 2018-11-06 DIAGNOSIS — Z7952 Long term (current) use of systemic steroids: Secondary | ICD-10-CM | POA: Diagnosis not present

## 2018-11-06 DIAGNOSIS — M549 Dorsalgia, unspecified: Secondary | ICD-10-CM | POA: Diagnosis not present

## 2018-11-08 DIAGNOSIS — J4541 Moderate persistent asthma with (acute) exacerbation: Secondary | ICD-10-CM | POA: Diagnosis not present

## 2018-11-08 DIAGNOSIS — Z7952 Long term (current) use of systemic steroids: Secondary | ICD-10-CM | POA: Diagnosis not present

## 2018-11-08 DIAGNOSIS — Z96643 Presence of artificial hip joint, bilateral: Secondary | ICD-10-CM | POA: Diagnosis not present

## 2018-11-08 DIAGNOSIS — G8929 Other chronic pain: Secondary | ICD-10-CM | POA: Diagnosis not present

## 2018-11-08 DIAGNOSIS — M549 Dorsalgia, unspecified: Secondary | ICD-10-CM | POA: Diagnosis not present

## 2018-11-11 DIAGNOSIS — M549 Dorsalgia, unspecified: Secondary | ICD-10-CM | POA: Diagnosis not present

## 2018-11-11 DIAGNOSIS — J4541 Moderate persistent asthma with (acute) exacerbation: Secondary | ICD-10-CM | POA: Diagnosis not present

## 2018-11-11 DIAGNOSIS — G8929 Other chronic pain: Secondary | ICD-10-CM | POA: Diagnosis not present

## 2018-11-11 DIAGNOSIS — Z7952 Long term (current) use of systemic steroids: Secondary | ICD-10-CM | POA: Diagnosis not present

## 2018-11-11 DIAGNOSIS — Z96643 Presence of artificial hip joint, bilateral: Secondary | ICD-10-CM | POA: Diagnosis not present

## 2018-11-19 DIAGNOSIS — R110 Nausea: Secondary | ICD-10-CM | POA: Diagnosis not present

## 2018-11-19 DIAGNOSIS — G2581 Restless legs syndrome: Secondary | ICD-10-CM | POA: Diagnosis not present

## 2018-11-19 DIAGNOSIS — M5417 Radiculopathy, lumbosacral region: Secondary | ICD-10-CM | POA: Diagnosis not present

## 2018-11-19 DIAGNOSIS — G4700 Insomnia, unspecified: Secondary | ICD-10-CM | POA: Diagnosis not present

## 2018-11-19 DIAGNOSIS — G894 Chronic pain syndrome: Secondary | ICD-10-CM | POA: Diagnosis not present

## 2018-11-19 DIAGNOSIS — M79605 Pain in left leg: Secondary | ICD-10-CM | POA: Diagnosis not present

## 2018-11-19 DIAGNOSIS — M79642 Pain in left hand: Secondary | ICD-10-CM | POA: Diagnosis not present

## 2018-11-19 DIAGNOSIS — M170 Bilateral primary osteoarthritis of knee: Secondary | ICD-10-CM | POA: Diagnosis not present

## 2018-11-19 DIAGNOSIS — Z79899 Other long term (current) drug therapy: Secondary | ICD-10-CM | POA: Diagnosis not present

## 2018-12-31 DIAGNOSIS — M5417 Radiculopathy, lumbosacral region: Secondary | ICD-10-CM | POA: Diagnosis not present

## 2018-12-31 DIAGNOSIS — M79605 Pain in left leg: Secondary | ICD-10-CM | POA: Diagnosis not present

## 2018-12-31 DIAGNOSIS — G5600 Carpal tunnel syndrome, unspecified upper limb: Secondary | ICD-10-CM | POA: Diagnosis not present

## 2018-12-31 DIAGNOSIS — M1711 Unilateral primary osteoarthritis, right knee: Secondary | ICD-10-CM | POA: Diagnosis not present

## 2018-12-31 DIAGNOSIS — M545 Low back pain: Secondary | ICD-10-CM | POA: Diagnosis not present

## 2018-12-31 DIAGNOSIS — Z79899 Other long term (current) drug therapy: Secondary | ICD-10-CM | POA: Diagnosis not present

## 2018-12-31 DIAGNOSIS — M1712 Unilateral primary osteoarthritis, left knee: Secondary | ICD-10-CM | POA: Diagnosis not present

## 2018-12-31 DIAGNOSIS — G4700 Insomnia, unspecified: Secondary | ICD-10-CM | POA: Diagnosis not present

## 2018-12-31 DIAGNOSIS — G894 Chronic pain syndrome: Secondary | ICD-10-CM | POA: Diagnosis not present

## 2019-02-25 DIAGNOSIS — M5417 Radiculopathy, lumbosacral region: Secondary | ICD-10-CM | POA: Diagnosis not present

## 2019-02-25 DIAGNOSIS — M79605 Pain in left leg: Secondary | ICD-10-CM | POA: Diagnosis not present

## 2019-02-25 DIAGNOSIS — G5600 Carpal tunnel syndrome, unspecified upper limb: Secondary | ICD-10-CM | POA: Diagnosis not present

## 2019-02-25 DIAGNOSIS — Z79899 Other long term (current) drug therapy: Secondary | ICD-10-CM | POA: Diagnosis not present

## 2019-02-25 DIAGNOSIS — M545 Low back pain: Secondary | ICD-10-CM | POA: Diagnosis not present

## 2019-02-25 DIAGNOSIS — G4700 Insomnia, unspecified: Secondary | ICD-10-CM | POA: Diagnosis not present

## 2019-02-25 DIAGNOSIS — M25562 Pain in left knee: Secondary | ICD-10-CM | POA: Diagnosis not present

## 2019-02-25 DIAGNOSIS — M170 Bilateral primary osteoarthritis of knee: Secondary | ICD-10-CM | POA: Diagnosis not present

## 2019-02-25 DIAGNOSIS — G894 Chronic pain syndrome: Secondary | ICD-10-CM | POA: Diagnosis not present

## 2019-05-07 DIAGNOSIS — G4700 Insomnia, unspecified: Secondary | ICD-10-CM | POA: Diagnosis not present

## 2019-05-07 DIAGNOSIS — M79605 Pain in left leg: Secondary | ICD-10-CM | POA: Diagnosis not present

## 2019-05-07 DIAGNOSIS — Z79899 Other long term (current) drug therapy: Secondary | ICD-10-CM | POA: Diagnosis not present

## 2019-05-07 DIAGNOSIS — G894 Chronic pain syndrome: Secondary | ICD-10-CM | POA: Diagnosis not present

## 2019-05-07 DIAGNOSIS — M1712 Unilateral primary osteoarthritis, left knee: Secondary | ICD-10-CM | POA: Diagnosis not present

## 2019-05-07 DIAGNOSIS — M545 Low back pain: Secondary | ICD-10-CM | POA: Diagnosis not present

## 2019-05-07 DIAGNOSIS — M1711 Unilateral primary osteoarthritis, right knee: Secondary | ICD-10-CM | POA: Diagnosis not present

## 2019-05-07 DIAGNOSIS — G5600 Carpal tunnel syndrome, unspecified upper limb: Secondary | ICD-10-CM | POA: Diagnosis not present

## 2019-05-07 DIAGNOSIS — M170 Bilateral primary osteoarthritis of knee: Secondary | ICD-10-CM | POA: Diagnosis not present

## 2019-05-07 DIAGNOSIS — M5417 Radiculopathy, lumbosacral region: Secondary | ICD-10-CM | POA: Diagnosis not present

## 2019-06-18 DIAGNOSIS — Z5181 Encounter for therapeutic drug level monitoring: Secondary | ICD-10-CM | POA: Diagnosis not present

## 2019-06-18 DIAGNOSIS — M79609 Pain in unspecified limb: Secondary | ICD-10-CM | POA: Diagnosis not present

## 2019-06-18 DIAGNOSIS — M5417 Radiculopathy, lumbosacral region: Secondary | ICD-10-CM | POA: Diagnosis not present

## 2019-06-18 DIAGNOSIS — M545 Low back pain: Secondary | ICD-10-CM | POA: Diagnosis not present

## 2019-06-18 DIAGNOSIS — G4700 Insomnia, unspecified: Secondary | ICD-10-CM | POA: Diagnosis not present

## 2019-06-18 DIAGNOSIS — M1711 Unilateral primary osteoarthritis, right knee: Secondary | ICD-10-CM | POA: Diagnosis not present

## 2019-06-18 DIAGNOSIS — M170 Bilateral primary osteoarthritis of knee: Secondary | ICD-10-CM | POA: Diagnosis not present

## 2019-06-18 DIAGNOSIS — G894 Chronic pain syndrome: Secondary | ICD-10-CM | POA: Diagnosis not present

## 2019-06-18 DIAGNOSIS — Z79899 Other long term (current) drug therapy: Secondary | ICD-10-CM | POA: Diagnosis not present

## 2019-06-18 DIAGNOSIS — M79605 Pain in left leg: Secondary | ICD-10-CM | POA: Diagnosis not present

## 2019-06-18 DIAGNOSIS — Z79891 Long term (current) use of opiate analgesic: Secondary | ICD-10-CM | POA: Diagnosis not present

## 2019-07-16 DIAGNOSIS — M79609 Pain in unspecified limb: Secondary | ICD-10-CM | POA: Diagnosis not present

## 2019-07-16 DIAGNOSIS — M5417 Radiculopathy, lumbosacral region: Secondary | ICD-10-CM | POA: Diagnosis not present

## 2019-07-16 DIAGNOSIS — M79605 Pain in left leg: Secondary | ICD-10-CM | POA: Diagnosis not present

## 2019-07-16 DIAGNOSIS — G4700 Insomnia, unspecified: Secondary | ICD-10-CM | POA: Diagnosis not present

## 2019-07-16 DIAGNOSIS — Z79891 Long term (current) use of opiate analgesic: Secondary | ICD-10-CM | POA: Diagnosis not present

## 2019-07-16 DIAGNOSIS — G894 Chronic pain syndrome: Secondary | ICD-10-CM | POA: Diagnosis not present

## 2019-07-16 DIAGNOSIS — M170 Bilateral primary osteoarthritis of knee: Secondary | ICD-10-CM | POA: Diagnosis not present

## 2019-07-16 DIAGNOSIS — Z5181 Encounter for therapeutic drug level monitoring: Secondary | ICD-10-CM | POA: Diagnosis not present

## 2019-07-16 DIAGNOSIS — Z79899 Other long term (current) drug therapy: Secondary | ICD-10-CM | POA: Diagnosis not present

## 2019-07-16 DIAGNOSIS — M545 Low back pain: Secondary | ICD-10-CM | POA: Diagnosis not present

## 2019-07-16 DIAGNOSIS — M1711 Unilateral primary osteoarthritis, right knee: Secondary | ICD-10-CM | POA: Diagnosis not present

## 2019-09-24 DIAGNOSIS — M1711 Unilateral primary osteoarthritis, right knee: Secondary | ICD-10-CM | POA: Diagnosis not present

## 2019-09-24 DIAGNOSIS — G894 Chronic pain syndrome: Secondary | ICD-10-CM | POA: Diagnosis not present

## 2019-09-24 DIAGNOSIS — M5417 Radiculopathy, lumbosacral region: Secondary | ICD-10-CM | POA: Diagnosis not present

## 2019-09-24 DIAGNOSIS — M79609 Pain in unspecified limb: Secondary | ICD-10-CM | POA: Diagnosis not present

## 2019-09-24 DIAGNOSIS — M1712 Unilateral primary osteoarthritis, left knee: Secondary | ICD-10-CM | POA: Diagnosis not present

## 2019-09-24 DIAGNOSIS — M170 Bilateral primary osteoarthritis of knee: Secondary | ICD-10-CM | POA: Diagnosis not present

## 2019-09-24 DIAGNOSIS — M545 Low back pain: Secondary | ICD-10-CM | POA: Diagnosis not present

## 2019-09-24 DIAGNOSIS — M79605 Pain in left leg: Secondary | ICD-10-CM | POA: Diagnosis not present

## 2019-09-24 DIAGNOSIS — Z79899 Other long term (current) drug therapy: Secondary | ICD-10-CM | POA: Diagnosis not present

## 2019-10-17 DIAGNOSIS — E782 Mixed hyperlipidemia: Secondary | ICD-10-CM | POA: Diagnosis not present

## 2019-10-17 DIAGNOSIS — I119 Hypertensive heart disease without heart failure: Secondary | ICD-10-CM | POA: Diagnosis not present

## 2019-10-17 DIAGNOSIS — E669 Obesity, unspecified: Secondary | ICD-10-CM | POA: Diagnosis not present

## 2019-12-22 ENCOUNTER — Other Ambulatory Visit: Payer: Self-pay | Admitting: *Deleted

## 2019-12-22 MED ORDER — SEREVENT DISKUS 50 MCG/DOSE IN AEPB: 1.0000 | INHALATION_SPRAY | Freq: Two times a day (BID) | RESPIRATORY_TRACT | 6 refills | Status: DC

## 2019-12-22 MED ORDER — LISINOPRIL 10 MG PO TABS: 10.0000 mg | ORAL_TABLET | Freq: Every day | ORAL | 3 refills | Status: DC

## 2020-02-17 DIAGNOSIS — M19071 Primary osteoarthritis, right ankle and foot: Secondary | ICD-10-CM | POA: Diagnosis not present

## 2020-02-17 DIAGNOSIS — M67873 Other specified disorders of tendon, right ankle and foot: Secondary | ICD-10-CM | POA: Diagnosis not present

## 2020-03-04 ENCOUNTER — Other Ambulatory Visit: Payer: Self-pay

## 2020-03-04 MED ORDER — ANORO ELLIPTA 62.5-25 MCG/INH IN AEPB: 1.0000 | INHALATION_SPRAY | Freq: Every day | RESPIRATORY_TRACT | 3 refills | Status: AC

## 2020-03-04 NOTE — Telephone Encounter (Signed)
Patient called stating insurance no longer covers his current inhaler Serevent. Patient is requesting a new inhaler. Anoro was sent in per Dr. Lavera Guise

## 2020-03-08 DIAGNOSIS — Z5181 Encounter for therapeutic drug level monitoring: Secondary | ICD-10-CM | POA: Diagnosis not present

## 2020-03-08 DIAGNOSIS — G4700 Insomnia, unspecified: Secondary | ICD-10-CM | POA: Diagnosis not present

## 2020-03-08 DIAGNOSIS — M79605 Pain in left leg: Secondary | ICD-10-CM | POA: Diagnosis not present

## 2020-03-08 DIAGNOSIS — G894 Chronic pain syndrome: Secondary | ICD-10-CM | POA: Diagnosis not present

## 2020-03-08 DIAGNOSIS — M170 Bilateral primary osteoarthritis of knee: Secondary | ICD-10-CM | POA: Diagnosis not present

## 2020-03-08 DIAGNOSIS — G5600 Carpal tunnel syndrome, unspecified upper limb: Secondary | ICD-10-CM | POA: Diagnosis not present

## 2020-03-08 DIAGNOSIS — Z79899 Other long term (current) drug therapy: Secondary | ICD-10-CM | POA: Diagnosis not present

## 2020-03-08 DIAGNOSIS — Z79891 Long term (current) use of opiate analgesic: Secondary | ICD-10-CM | POA: Diagnosis not present

## 2020-03-08 DIAGNOSIS — M79609 Pain in unspecified limb: Secondary | ICD-10-CM | POA: Diagnosis not present

## 2020-03-08 DIAGNOSIS — M545 Low back pain: Secondary | ICD-10-CM | POA: Diagnosis not present

## 2020-03-08 DIAGNOSIS — R944 Abnormal results of kidney function studies: Secondary | ICD-10-CM | POA: Diagnosis not present

## 2020-03-08 DIAGNOSIS — M5417 Radiculopathy, lumbosacral region: Secondary | ICD-10-CM | POA: Diagnosis not present

## 2020-03-18 ENCOUNTER — Other Ambulatory Visit: Payer: Self-pay | Admitting: *Deleted

## 2020-03-18 MED ORDER — VENTOLIN HFA 108 (90 BASE) MCG/ACT IN AERS: 2.0000 | INHALATION_SPRAY | Freq: Four times a day (QID) | RESPIRATORY_TRACT | 3 refills | Status: DC | PRN

## 2020-03-22 ENCOUNTER — Other Ambulatory Visit: Payer: Self-pay

## 2020-03-22 MED ORDER — ALLOPURINOL 100 MG PO TABS: 100.0000 mg | ORAL_TABLET | Freq: Every day | ORAL | 0 refills | Status: DC

## 2020-04-13 DIAGNOSIS — G5600 Carpal tunnel syndrome, unspecified upper limb: Secondary | ICD-10-CM | POA: Diagnosis not present

## 2020-04-13 DIAGNOSIS — M5417 Radiculopathy, lumbosacral region: Secondary | ICD-10-CM | POA: Diagnosis not present

## 2020-04-13 DIAGNOSIS — G894 Chronic pain syndrome: Secondary | ICD-10-CM | POA: Diagnosis not present

## 2020-04-13 DIAGNOSIS — M79609 Pain in unspecified limb: Secondary | ICD-10-CM | POA: Diagnosis not present

## 2020-04-13 DIAGNOSIS — M170 Bilateral primary osteoarthritis of knee: Secondary | ICD-10-CM | POA: Diagnosis not present

## 2020-04-13 DIAGNOSIS — Z79899 Other long term (current) drug therapy: Secondary | ICD-10-CM | POA: Diagnosis not present

## 2020-04-13 DIAGNOSIS — M545 Low back pain: Secondary | ICD-10-CM | POA: Diagnosis not present

## 2020-04-13 DIAGNOSIS — G4700 Insomnia, unspecified: Secondary | ICD-10-CM | POA: Diagnosis not present

## 2020-04-13 DIAGNOSIS — M79605 Pain in left leg: Secondary | ICD-10-CM | POA: Diagnosis not present

## 2020-04-26 ENCOUNTER — Other Ambulatory Visit: Payer: Self-pay | Admitting: *Deleted

## 2020-04-26 MED ORDER — VENTOLIN HFA 108 (90 BASE) MCG/ACT IN AERS: 2.0000 | INHALATION_SPRAY | Freq: Four times a day (QID) | RESPIRATORY_TRACT | 3 refills | Status: DC | PRN

## 2020-04-27 ENCOUNTER — Other Ambulatory Visit: Payer: Self-pay | Admitting: *Deleted

## 2020-04-27 ENCOUNTER — Other Ambulatory Visit: Payer: Self-pay | Admitting: Internal Medicine

## 2020-04-28 DIAGNOSIS — R202 Paresthesia of skin: Secondary | ICD-10-CM | POA: Diagnosis not present

## 2020-04-29 ENCOUNTER — Other Ambulatory Visit: Payer: Self-pay | Admitting: Internal Medicine

## 2020-05-07 ENCOUNTER — Other Ambulatory Visit: Payer: Self-pay

## 2020-05-17 ENCOUNTER — Emergency Department: Payer: Medicare HMO

## 2020-05-17 ENCOUNTER — Inpatient Hospital Stay
Admission: EM | Admit: 2020-05-17 | Discharge: 2020-05-18 | DRG: 534 | Disposition: A | Discharge status: Other Acute Inpatient Hospital | Payer: Medicare HMO | Attending: Family Medicine | Admitting: Family Medicine

## 2020-05-17 ENCOUNTER — Other Ambulatory Visit: Payer: Self-pay

## 2020-05-17 ENCOUNTER — Other Ambulatory Visit: Payer: Self-pay | Admitting: *Deleted

## 2020-05-17 DIAGNOSIS — Z20822 Contact with and (suspected) exposure to covid-19: Secondary | ICD-10-CM | POA: Diagnosis present

## 2020-05-17 DIAGNOSIS — S2231XA Fracture of one rib, right side, initial encounter for closed fracture: Secondary | ICD-10-CM | POA: Diagnosis not present

## 2020-05-17 DIAGNOSIS — J45909 Unspecified asthma, uncomplicated: Secondary | ICD-10-CM | POA: Diagnosis present

## 2020-05-17 DIAGNOSIS — R531 Weakness: Secondary | ICD-10-CM | POA: Diagnosis not present

## 2020-05-17 DIAGNOSIS — W19XXXA Unspecified fall, initial encounter: Secondary | ICD-10-CM | POA: Diagnosis not present

## 2020-05-17 DIAGNOSIS — Z808 Family history of malignant neoplasm of other organs or systems: Secondary | ICD-10-CM

## 2020-05-17 DIAGNOSIS — R0902 Hypoxemia: Secondary | ICD-10-CM | POA: Diagnosis not present

## 2020-05-17 DIAGNOSIS — R52 Pain, unspecified: Secondary | ICD-10-CM | POA: Diagnosis not present

## 2020-05-17 DIAGNOSIS — S72492A Other fracture of lower end of left femur, initial encounter for closed fracture: Secondary | ICD-10-CM | POA: Diagnosis not present

## 2020-05-17 DIAGNOSIS — Z79899 Other long term (current) drug therapy: Secondary | ICD-10-CM | POA: Diagnosis not present

## 2020-05-17 DIAGNOSIS — Z96643 Presence of artificial hip joint, bilateral: Secondary | ICD-10-CM | POA: Diagnosis present

## 2020-05-17 DIAGNOSIS — S72352A Displaced comminuted fracture of shaft of left femur, initial encounter for closed fracture: Principal | ICD-10-CM | POA: Diagnosis present

## 2020-05-17 DIAGNOSIS — I10 Essential (primary) hypertension: Secondary | ICD-10-CM | POA: Diagnosis present

## 2020-05-17 DIAGNOSIS — S72499A Other fracture of lower end of unspecified femur, initial encounter for closed fracture: Secondary | ICD-10-CM | POA: Diagnosis present

## 2020-05-17 DIAGNOSIS — Z825 Family history of asthma and other chronic lower respiratory diseases: Secondary | ICD-10-CM | POA: Diagnosis not present

## 2020-05-17 LAB — CBC WITH DIFFERENTIAL/PLATELET
Abs Immature Granulocytes: 0.04 10*3/uL (ref 0.00–0.07)
Basophils Absolute: 0 10*3/uL (ref 0.0–0.1)
Basophils Relative: 0 %
Eosinophils Absolute: 0.1 10*3/uL (ref 0.0–0.5)
Eosinophils Relative: 1 %
HCT: 38.5 % — ABNORMAL LOW (ref 39.0–52.0)
Hemoglobin: 13 g/dL (ref 13.0–17.0)
Immature Granulocytes: 0 %
Lymphocytes Relative: 9 %
Lymphs Abs: 1 10*3/uL (ref 0.7–4.0)
MCH: 34.9 pg — ABNORMAL HIGH (ref 26.0–34.0)
MCHC: 33.8 g/dL (ref 30.0–36.0)
MCV: 103.2 fL — ABNORMAL HIGH (ref 80.0–100.0)
Monocytes Absolute: 0.9 10*3/uL (ref 0.1–1.0)
Monocytes Relative: 9 %
Neutro Abs: 8.3 10*3/uL — ABNORMAL HIGH (ref 1.7–7.7)
Neutrophils Relative %: 81 %
Platelets: 229 10*3/uL (ref 150–400)
RBC: 3.73 MIL/uL — ABNORMAL LOW (ref 4.22–5.81)
RDW: 14.2 % (ref 11.5–15.5)
WBC: 10.3 10*3/uL (ref 4.0–10.5)
nRBC: 0 % (ref 0.0–0.2)

## 2020-05-17 LAB — COMPREHENSIVE METABOLIC PANEL
ALT: 13 U/L (ref 0–44)
AST: 15 U/L (ref 15–41)
Albumin: 3.4 g/dL — ABNORMAL LOW (ref 3.5–5.0)
Alkaline Phosphatase: 101 U/L (ref 38–126)
Anion gap: 6 (ref 5–15)
BUN: 25 mg/dL — ABNORMAL HIGH (ref 8–23)
CO2: 23 mmol/L (ref 22–32)
Calcium: 8.8 mg/dL — ABNORMAL LOW (ref 8.9–10.3)
Chloride: 107 mmol/L (ref 98–111)
Creatinine, Ser: 1.8 mg/dL — ABNORMAL HIGH (ref 0.61–1.24)
GFR calc Af Amer: 43 mL/min — ABNORMAL LOW (ref >60)
GFR calc non Af Amer: 37 mL/min — ABNORMAL LOW (ref >60)
Glucose, Bld: 106 mg/dL — ABNORMAL HIGH (ref 70–99)
Potassium: 5.1 mmol/L (ref 3.5–5.1)
Sodium: 136 mmol/L (ref 135–145)
Total Bilirubin: 1.2 mg/dL (ref 0.3–1.2)
Total Protein: 6.9 g/dL (ref 6.5–8.1)

## 2020-05-17 LAB — RESPIRATORY PANEL BY RT PCR (FLU A&B, COVID)
Influenza A by PCR: NEGATIVE
Influenza B by PCR: NEGATIVE
SARS Coronavirus 2 by RT PCR: NEGATIVE

## 2020-05-17 MED ORDER — MORPHINE SULFATE (PF) 4 MG/ML IV SOLN: 4.0000 mg | Freq: Once | INTRAVENOUS | Status: AC

## 2020-05-17 MED ORDER — FENTANYL CITRATE (PF) 100 MCG/2ML IJ SOLN
100.0000 ug | INTRAMUSCULAR | Status: DC | PRN
  Administered 2020-05-18: 100 ug via INTRAVENOUS
  Filled 2020-05-17: qty 2

## 2020-05-17 MED ORDER — MIDAZOLAM HCL 2 MG/2ML IJ SOLN
INTRAMUSCULAR | Status: AC
  Administered 2020-05-17: 2 mg
  Filled 2020-05-17: qty 2

## 2020-05-17 MED ORDER — HYDROMORPHONE HCL 1 MG/ML IJ SOLN
1.0000 mg | Freq: Once | INTRAMUSCULAR | Status: AC
  Administered 2020-05-17: 1 mg via INTRAVENOUS
  Filled 2020-05-17: qty 1

## 2020-05-17 MED ORDER — MORPHINE SULFATE (PF) 4 MG/ML IV SOLN
INTRAVENOUS | Status: AC
  Administered 2020-05-17: 4 mg via INTRAVENOUS
  Filled 2020-05-17: qty 1

## 2020-05-17 MED ORDER — HYDROMORPHONE HCL 1 MG/ML IJ SOLN
INTRAMUSCULAR | Status: AC
  Administered 2020-05-17: 1 mg via INTRAVENOUS
  Filled 2020-05-17: qty 3

## 2020-05-17 MED ORDER — MIDAZOLAM HCL 2 MG/2ML IJ SOLN
INTRAMUSCULAR | Status: AC
  Administered 2020-05-17: 2 mg
  Filled 2020-05-17: qty 4

## 2020-05-17 MED ORDER — ALBUTEROL SULFATE HFA 108 (90 BASE) MCG/ACT IN AERS: 1.0000 | INHALATION_SPRAY | Freq: Every day | RESPIRATORY_TRACT | 4 refills | Status: DC

## 2020-05-17 MED ORDER — NALOXONE HCL 2 MG/2ML IJ SOSY
PREFILLED_SYRINGE | INTRAMUSCULAR | Status: AC
  Filled 2020-05-17: qty 2

## 2020-05-17 MED ORDER — FLUMAZENIL 0.5 MG/5ML IV SOLN
INTRAVENOUS | Status: AC
  Filled 2020-05-17: qty 5

## 2020-05-17 MED ORDER — ONDANSETRON HCL 4 MG/2ML IJ SOLN
4.0000 mg | Freq: Once | INTRAMUSCULAR | Status: AC
  Administered 2020-05-17: 4 mg via INTRAVENOUS
  Filled 2020-05-17: qty 2

## 2020-05-17 MED ORDER — HYDROCODONE-ACETAMINOPHEN 5-325 MG PO TABS
1.0000 | ORAL_TABLET | Freq: Once | ORAL | Status: AC
  Administered 2020-05-17: 1 via ORAL
  Filled 2020-05-17: qty 1

## 2020-05-17 MED ORDER — HYDROMORPHONE HCL 1 MG/ML IJ SOLN
0.5000 mg | Freq: Once | INTRAMUSCULAR | Status: AC
  Administered 2020-05-17: 0.5 mg via INTRAVENOUS
  Filled 2020-05-17: qty 1

## 2020-05-17 MED ORDER — MIDAZOLAM HCL 5 MG/5ML IJ SOLN: 2.0000 mg | Freq: Once | INTRAMUSCULAR | Status: AC

## 2020-05-17 NOTE — ED Triage Notes (Signed)
Pt states he was walking down his hallway and his left leg gave out; now with pain at distal femur with swelling. Hx bilateral hip replacement; reported fracture to left leg
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
## 2020-05-17 NOTE — ED Notes (Signed)
Per Dr. Cinda [facility] patient accepted to [facility] waiting for bed assignment  1950

## 2020-05-17 NOTE — ED Notes (Signed)
Called and checked with Tammy at [facility], waiting for bed assignment no idea when one would be assigned  2220

## 2020-05-17 NOTE — ED Notes (Signed)
Called [facility] for transfer spoke to [facility]

## 2020-05-18 ENCOUNTER — Inpatient Hospital Stay (HOSPITAL_COMMUNITY): Payer: Medicare HMO

## 2020-05-18 ENCOUNTER — Inpatient Hospital Stay (HOSPITAL_COMMUNITY)
Admission: EM | Admit: 2020-05-18 | Discharge: 2020-05-27 | DRG: 480 | Disposition: A | Discharge status: Skilled Nursing Facility | Payer: Medicare HMO | Source: Other Acute Inpatient Hospital | Attending: Internal Medicine | Admitting: Internal Medicine

## 2020-05-18 ENCOUNTER — Encounter (HOSPITAL_COMMUNITY): Payer: Self-pay | Admitting: Family Medicine

## 2020-05-18 ENCOUNTER — Other Ambulatory Visit: Payer: Self-pay

## 2020-05-18 DIAGNOSIS — G894 Chronic pain syndrome: Secondary | ICD-10-CM | POA: Diagnosis not present

## 2020-05-18 DIAGNOSIS — I129 Hypertensive chronic kidney disease with stage 1 through stage 4 chronic kidney disease, or unspecified chronic kidney disease: Secondary | ICD-10-CM | POA: Diagnosis present

## 2020-05-18 DIAGNOSIS — R0902 Hypoxemia: Secondary | ICD-10-CM | POA: Diagnosis not present

## 2020-05-18 DIAGNOSIS — N1831 Chronic kidney disease, stage 3a: Secondary | ICD-10-CM | POA: Diagnosis not present

## 2020-05-18 DIAGNOSIS — J90 Pleural effusion, not elsewhere classified: Secondary | ICD-10-CM

## 2020-05-18 DIAGNOSIS — Z96643 Presence of artificial hip joint, bilateral: Secondary | ICD-10-CM | POA: Diagnosis present

## 2020-05-18 DIAGNOSIS — J45909 Unspecified asthma, uncomplicated: Secondary | ICD-10-CM | POA: Diagnosis present

## 2020-05-18 DIAGNOSIS — G9341 Metabolic encephalopathy: Secondary | ICD-10-CM | POA: Diagnosis not present

## 2020-05-18 DIAGNOSIS — T148XXA Other injury of unspecified body region, initial encounter: Secondary | ICD-10-CM

## 2020-05-18 DIAGNOSIS — S72492A Other fracture of lower end of left femur, initial encounter for closed fracture: Secondary | ICD-10-CM

## 2020-05-18 DIAGNOSIS — Z825 Family history of asthma and other chronic lower respiratory diseases: Secondary | ICD-10-CM

## 2020-05-18 DIAGNOSIS — W1830XA Fall on same level, unspecified, initial encounter: Secondary | ICD-10-CM | POA: Diagnosis present

## 2020-05-18 DIAGNOSIS — N179 Acute kidney failure, unspecified: Secondary | ICD-10-CM | POA: Diagnosis not present

## 2020-05-18 DIAGNOSIS — Z808 Family history of malignant neoplasm of other organs or systems: Secondary | ICD-10-CM | POA: Diagnosis not present

## 2020-05-18 DIAGNOSIS — J9811 Atelectasis: Secondary | ICD-10-CM | POA: Diagnosis not present

## 2020-05-18 DIAGNOSIS — G4733 Obstructive sleep apnea (adult) (pediatric): Secondary | ICD-10-CM | POA: Diagnosis present

## 2020-05-18 DIAGNOSIS — S72452A Displaced supracondylar fracture without intracondylar extension of lower end of left femur, initial encounter for closed fracture: Secondary | ICD-10-CM | POA: Diagnosis not present

## 2020-05-18 DIAGNOSIS — Z7401 Bed confinement status: Secondary | ICD-10-CM | POA: Diagnosis not present

## 2020-05-18 DIAGNOSIS — R7989 Other specified abnormal findings of blood chemistry: Secondary | ICD-10-CM | POA: Diagnosis present

## 2020-05-18 DIAGNOSIS — Z23 Encounter for immunization: Secondary | ICD-10-CM

## 2020-05-18 DIAGNOSIS — F32A Depression, unspecified: Secondary | ICD-10-CM | POA: Diagnosis present

## 2020-05-18 DIAGNOSIS — S7292XA Unspecified fracture of left femur, initial encounter for closed fracture: Secondary | ICD-10-CM | POA: Diagnosis present

## 2020-05-18 DIAGNOSIS — Z6837 Body mass index (BMI) 37.0-37.9, adult: Secondary | ICD-10-CM | POA: Diagnosis not present

## 2020-05-18 DIAGNOSIS — M2550 Pain in unspecified joint: Secondary | ICD-10-CM | POA: Diagnosis not present

## 2020-05-18 DIAGNOSIS — S72492D Other fracture of lower end of left femur, subsequent encounter for closed fracture with routine healing: Secondary | ICD-10-CM | POA: Diagnosis not present

## 2020-05-18 DIAGNOSIS — W19XXXA Unspecified fall, initial encounter: Secondary | ICD-10-CM | POA: Diagnosis not present

## 2020-05-18 DIAGNOSIS — Z20822 Contact with and (suspected) exposure to covid-19: Secondary | ICD-10-CM | POA: Diagnosis present

## 2020-05-18 DIAGNOSIS — D62 Acute posthemorrhagic anemia: Secondary | ICD-10-CM | POA: Diagnosis not present

## 2020-05-18 DIAGNOSIS — S72402D Unspecified fracture of lower end of left femur, subsequent encounter for closed fracture with routine healing: Secondary | ICD-10-CM

## 2020-05-18 DIAGNOSIS — S72352A Displaced comminuted fracture of shaft of left femur, initial encounter for closed fracture: Secondary | ICD-10-CM | POA: Diagnosis not present

## 2020-05-18 DIAGNOSIS — Z9049 Acquired absence of other specified parts of digestive tract: Secondary | ICD-10-CM | POA: Diagnosis not present

## 2020-05-18 DIAGNOSIS — Y92009 Unspecified place in unspecified non-institutional (private) residence as the place of occurrence of the external cause: Secondary | ICD-10-CM | POA: Diagnosis not present

## 2020-05-18 DIAGNOSIS — I10 Essential (primary) hypertension: Secondary | ICD-10-CM | POA: Diagnosis present

## 2020-05-18 DIAGNOSIS — N183 Chronic kidney disease, stage 3 unspecified: Secondary | ICD-10-CM | POA: Diagnosis not present

## 2020-05-18 DIAGNOSIS — E875 Hyperkalemia: Secondary | ICD-10-CM | POA: Diagnosis not present

## 2020-05-18 DIAGNOSIS — D649 Anemia, unspecified: Secondary | ICD-10-CM | POA: Diagnosis not present

## 2020-05-18 DIAGNOSIS — R0603 Acute respiratory distress: Secondary | ICD-10-CM

## 2020-05-18 DIAGNOSIS — J45901 Unspecified asthma with (acute) exacerbation: Secondary | ICD-10-CM | POA: Diagnosis not present

## 2020-05-18 DIAGNOSIS — D638 Anemia in other chronic diseases classified elsewhere: Secondary | ICD-10-CM | POA: Diagnosis present

## 2020-05-18 DIAGNOSIS — R52 Pain, unspecified: Secondary | ICD-10-CM | POA: Diagnosis not present

## 2020-05-18 DIAGNOSIS — Z419 Encounter for procedure for purposes other than remedying health state, unspecified: Secondary | ICD-10-CM

## 2020-05-18 DIAGNOSIS — S72499A Other fracture of lower end of unspecified femur, initial encounter for closed fracture: Secondary | ICD-10-CM | POA: Diagnosis present

## 2020-05-18 LAB — CBC WITH DIFFERENTIAL/PLATELET
Abs Immature Granulocytes: 0.05 10*3/uL (ref 0.00–0.07)
Basophils Absolute: 0 10*3/uL (ref 0.0–0.1)
Basophils Relative: 0 %
Eosinophils Absolute: 0 10*3/uL (ref 0.0–0.5)
Eosinophils Relative: 0 %
HCT: 40.3 % (ref 39.0–52.0)
Hemoglobin: 12.8 g/dL — ABNORMAL LOW (ref 13.0–17.0)
Immature Granulocytes: 1 %
Lymphocytes Relative: 9 %
Lymphs Abs: 1 10*3/uL (ref 0.7–4.0)
MCH: 34.7 pg — ABNORMAL HIGH (ref 26.0–34.0)
MCHC: 31.8 g/dL (ref 30.0–36.0)
MCV: 109.2 fL — ABNORMAL HIGH (ref 80.0–100.0)
Monocytes Absolute: 1 10*3/uL (ref 0.1–1.0)
Monocytes Relative: 9 %
Neutro Abs: 8.9 10*3/uL — ABNORMAL HIGH (ref 1.7–7.7)
Neutrophils Relative %: 81 %
Platelets: 276 10*3/uL (ref 150–400)
RBC: 3.69 MIL/uL — ABNORMAL LOW (ref 4.22–5.81)
RDW: 14.2 % (ref 11.5–15.5)
WBC: 11.1 10*3/uL — ABNORMAL HIGH (ref 4.0–10.5)
nRBC: 0 % (ref 0.0–0.2)

## 2020-05-18 LAB — COMPREHENSIVE METABOLIC PANEL
ALT: 68 U/L — ABNORMAL HIGH (ref 0–44)
AST: 93 U/L — ABNORMAL HIGH (ref 15–41)
Albumin: 3.2 g/dL — ABNORMAL LOW (ref 3.5–5.0)
Alkaline Phosphatase: 150 U/L — ABNORMAL HIGH (ref 38–126)
Anion gap: 13 (ref 5–15)
BUN: 30 mg/dL — ABNORMAL HIGH (ref 8–23)
CO2: 15 mmol/L — ABNORMAL LOW (ref 22–32)
Calcium: 9.3 mg/dL (ref 8.9–10.3)
Chloride: 109 mmol/L (ref 98–111)
Creatinine, Ser: 2.35 mg/dL — ABNORMAL HIGH (ref 0.61–1.24)
GFR calc Af Amer: 31 mL/min — ABNORMAL LOW (ref >60)
GFR calc non Af Amer: 27 mL/min — ABNORMAL LOW (ref >60)
Glucose, Bld: 105 mg/dL — ABNORMAL HIGH (ref 70–99)
Potassium: 6 mmol/L — ABNORMAL HIGH (ref 3.5–5.1)
Sodium: 137 mmol/L (ref 135–145)
Total Bilirubin: 1.6 mg/dL — ABNORMAL HIGH (ref 0.3–1.2)
Total Protein: 6.4 g/dL — ABNORMAL LOW (ref 6.5–8.1)

## 2020-05-18 LAB — TYPE AND SCREEN
ABO/RH(D): O POS
Antibody Screen: POSITIVE
Unit division: 0
Unit division: 0

## 2020-05-18 LAB — BPAM RBC
Blood Product Expiration Date: 202110162359
Blood Product Expiration Date: 202110162359
Unit Type and Rh: 9500
Unit Type and Rh: 9500

## 2020-05-18 LAB — HEMOGLOBIN AND HEMATOCRIT, BLOOD
HCT: 39 % (ref 39.0–52.0)
Hemoglobin: 13.1 g/dL (ref 13.0–17.0)

## 2020-05-18 LAB — MRSA PCR SCREENING: MRSA by PCR: NEGATIVE

## 2020-05-18 LAB — HIV ANTIBODY (ROUTINE TESTING W REFLEX): HIV Screen 4th Generation wRfx: NONREACTIVE

## 2020-05-18 MED ORDER — HYDRALAZINE HCL 20 MG/ML IJ SOLN
10.0000 mg | INTRAMUSCULAR | Status: DC | PRN
  Administered 2020-05-18: 10 mg via INTRAVENOUS
  Filled 2020-05-18: qty 1

## 2020-05-18 MED ORDER — MORPHINE SULFATE (PF) 2 MG/ML IV SOLN
1.0000 mg | INTRAVENOUS | Status: DC | PRN
  Administered 2020-05-18 – 2020-05-19 (×7): 1 mg via INTRAVENOUS
  Filled 2020-05-18 (×7): qty 1

## 2020-05-18 MED ORDER — DEXTROSE 5 % IV SOLN
3.0000 g | INTRAVENOUS | Status: AC
  Administered 2020-05-19: 3 g via INTRAVENOUS
  Filled 2020-05-18: qty 3

## 2020-05-18 MED ORDER — METHOCARBAMOL 500 MG PO TABS
500.0000 mg | ORAL_TABLET | Freq: Four times a day (QID) | ORAL | Status: DC | PRN
  Administered 2020-05-18 – 2020-05-27 (×11): 500 mg via ORAL
  Filled 2020-05-18 (×11): qty 1

## 2020-05-18 MED ORDER — INFLUENZA VAC A&B SA ADJ QUAD 0.5 ML IM PRSY
0.5000 mL | PREFILLED_SYRINGE | INTRAMUSCULAR | Status: AC
  Administered 2020-05-21: 0.5 mL via INTRAMUSCULAR
  Filled 2020-05-18: qty 0.5

## 2020-05-18 MED ORDER — SODIUM CHLORIDE 0.9 % IV SOLN: INTRAVENOUS | Status: DC

## 2020-05-18 MED ORDER — ALBUTEROL SULFATE (2.5 MG/3ML) 0.083% IN NEBU
2.5000 mg | INHALATION_SOLUTION | RESPIRATORY_TRACT | Status: DC | PRN
  Administered 2020-05-19 – 2020-05-24 (×5): 2.5 mg via RESPIRATORY_TRACT
  Filled 2020-05-18 (×6): qty 3

## 2020-05-18 MED ORDER — ADULT MULTIVITAMIN W/MINERALS CH
1.0000 | ORAL_TABLET | Freq: Every day | ORAL | Status: DC
  Administered 2020-05-18 – 2020-05-27 (×9): 1 via ORAL
  Filled 2020-05-18 (×9): qty 1

## 2020-05-18 MED ORDER — METHOCARBAMOL 1000 MG/10ML IJ SOLN
500.0000 mg | Freq: Four times a day (QID) | INTRAVENOUS | Status: DC | PRN
  Filled 2020-05-18: qty 5

## 2020-05-18 MED ORDER — ENSURE MAX PROTEIN PO LIQD
11.0000 [oz_av] | Freq: Every day | ORAL | Status: DC
  Administered 2020-05-19: 11 [oz_av] via ORAL
  Administered 2020-05-20 – 2020-05-21 (×2): 237 mL via ORAL
  Administered 2020-05-23 – 2020-05-26 (×4): 11 [oz_av] via ORAL
  Filled 2020-05-18: qty 330
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
## 2020-05-18 NOTE — ED Notes (Signed)
EMTALA reviewed by this RN.  

## 2020-05-18 NOTE — Progress Notes (Signed)
This patient is a 69 yr old man who fell at home after his leg "gave way". He fell to the floor. He hit his head, but did not lose consciousness. He had immediate pain in the left lower extremity. EMS was called and he presented to [facility]. In the ED he was found to have a distal left femur fracture. He was transferred to [facility] later this morning for evaluation by orthopedic surgery.   The patient has a past medical history significant for hypertension, chronic pain, and asthma.   In the ED he was also found to have creatinine of 1.8. on 05/17/2020. This morning this had increased to 2.35. IV fluids have been started.  [facility] have been consulted to admit the patient for further evaluation and care.  The patient is resting in bed. He is complaining of pain in his leg. I explain to him that he has to call and ask for pain medicine, which was not something that he understood. He is awake, alert, and oriented x 3. Mild distress. Heart and Lung sounds are within normal limits. Abdomen is soft, non-tender,non-distended. No cyanosis, clubbing, or edema is present in the lower extremities.  Orthopedic surgery has seen the patient and plans to take him to the OR tomorrow.
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
## 2020-05-18 NOTE — Plan of Care (Signed)
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
## 2020-05-18 NOTE — ED Notes (Signed)
Knee immobilizer placed on pt.

## 2020-05-19 ENCOUNTER — Inpatient Hospital Stay (HOSPITAL_COMMUNITY): Payer: Medicare HMO

## 2020-05-19 ENCOUNTER — Inpatient Hospital Stay (HOSPITAL_COMMUNITY): Payer: Medicare HMO | Admitting: Anesthesiology

## 2020-05-19 ENCOUNTER — Encounter (HOSPITAL_COMMUNITY): Payer: Self-pay | Admitting: Internal Medicine

## 2020-05-19 ENCOUNTER — Encounter (HOSPITAL_COMMUNITY)
Admission: EM | Disposition: A | Discharge status: Skilled Nursing Facility | Payer: Self-pay | Source: Other Acute Inpatient Hospital | Attending: Family Medicine

## 2020-05-19 DIAGNOSIS — S72352A Displaced comminuted fracture of shaft of left femur, initial encounter for closed fracture: Secondary | ICD-10-CM

## 2020-05-19 HISTORY — PX: ORIF FEMUR FRACTURE: SHX2119

## 2020-05-19 LAB — POCT I-STAT, CHEM 8
BUN: 39 mg/dL — ABNORMAL HIGH (ref 8–23)
Calcium, Ion: 1.52 mmol/L (ref 1.15–1.40)
Chloride: 108 mmol/L (ref 98–111)
Creatinine, Ser: 2.6 mg/dL — ABNORMAL HIGH (ref 0.61–1.24)
Glucose, Bld: 135 mg/dL — ABNORMAL HIGH (ref 70–99)
HCT: 33 % — ABNORMAL LOW (ref 39.0–52.0)
Hemoglobin: 11.2 g/dL — ABNORMAL LOW (ref 13.0–17.0)
Potassium: 5.5 mmol/L — ABNORMAL HIGH (ref 3.5–5.1)
Sodium: 140 mmol/L (ref 135–145)
TCO2: 22 mmol/L (ref 22–32)

## 2020-05-19 SURGERY — OPEN REDUCTION INTERNAL FIXATION (ORIF) DISTAL FEMUR FRACTURE
Anesthesia: General | Laterality: Left

## 2020-05-19 MED ORDER — VANCOMYCIN HCL 1000 MG IV SOLR
INTRAVENOUS | Status: DC | PRN
  Administered 2020-05-19: 1000 mg via TOPICAL

## 2020-05-19 MED ORDER — SUCCINYLCHOLINE CHLORIDE 20 MG/ML IJ SOLN
INTRAMUSCULAR | Status: DC | PRN
  Administered 2020-05-19: 200 mg via INTRAVENOUS

## 2020-05-19 MED ORDER — PHENYLEPHRINE HCL (PRESSORS) 10 MG/ML IV SOLN
INTRAVENOUS | Status: DC | PRN
  Administered 2020-05-19: 200 ug via INTRAVENOUS

## 2020-05-19 MED ORDER — KETOROLAC TROMETHAMINE 15 MG/ML IJ SOLN: 15.0000 mg | Freq: Four times a day (QID) | INTRAMUSCULAR | Status: DC

## 2020-05-19 MED ORDER — PROPOFOL 10 MG/ML IV BOLUS
INTRAVENOUS | Status: DC | PRN
  Administered 2020-05-19: 200 mg via INTRAVENOUS

## 2020-05-19 MED ORDER — ENSURE PRE-SURGERY PO LIQD: 296.0000 mL | Freq: Once | ORAL | Status: DC

## 2020-05-19 MED ORDER — LACTATED RINGERS IV SOLN: INTRAVENOUS | Status: DC

## 2020-05-19 MED ORDER — HYDROMORPHONE HCL 1 MG/ML IJ SOLN
0.5000 mg | INTRAMUSCULAR | Status: DC | PRN
  Administered 2020-05-19 – 2020-05-20 (×2): 0.5 mg via INTRAVENOUS
  Administered 2020-05-20: 1 mg via INTRAVENOUS
  Filled 2020-05-19: qty 1
  Filled 2020-05-19 (×2): qty 0.5

## 2020-05-19 MED ORDER — PROPOFOL 10 MG/ML IV BOLUS
INTRAVENOUS | Status: AC
  Filled 2020-05-19: qty 20

## 2020-05-19 MED ORDER — SUGAMMADEX SODIUM 200 MG/2ML IV SOLN
INTRAVENOUS | Status: DC | PRN
  Administered 2020-05-19: 300 mg via INTRAVENOUS

## 2020-05-19 MED ORDER — ONDANSETRON HCL 4 MG/2ML IJ SOLN
INTRAMUSCULAR | Status: AC
  Filled 2020-05-19: qty 2

## 2020-05-19 MED ORDER — ROCURONIUM BROMIDE 10 MG/ML (PF) SYRINGE
PREFILLED_SYRINGE | INTRAVENOUS | Status: AC
  Filled 2020-05-19: qty 10

## 2020-05-19 MED ORDER — DEXAMETHASONE SODIUM PHOSPHATE 10 MG/ML IJ SOLN
INTRAMUSCULAR | Status: AC
  Filled 2020-05-19: qty 1

## 2020-05-19 MED ORDER — METOCLOPRAMIDE HCL 5 MG PO TABS: 5.0000 mg | ORAL_TABLET | Freq: Three times a day (TID) | ORAL | Status: DC | PRN

## 2020-05-19 MED ORDER — ACETAMINOPHEN 500 MG PO TABS
1000.0000 mg | ORAL_TABLET | Freq: Once | ORAL | Status: AC
  Administered 2020-05-19: 1000 mg via ORAL
  Filled 2020-05-19: qty 2

## 2020-05-19 MED ORDER — POLYETHYLENE GLYCOL 3350 17 G PO PACK: 17.0000 g | PACK | Freq: Every day | ORAL | Status: DC | PRN

## 2020-05-19 MED ORDER — ROCURONIUM BROMIDE 10 MG/ML (PF) SYRINGE
PREFILLED_SYRINGE | INTRAVENOUS | Status: DC | PRN
  Administered 2020-05-19: 70 mg via INTRAVENOUS

## 2020-05-19 MED ORDER — CHLORHEXIDINE GLUCONATE 4 % EX LIQD
60.0000 mL | Freq: Once | CUTANEOUS | Status: DC
  Administered 2020-05-19: 4 via TOPICAL

## 2020-05-19 MED ORDER — DOCUSATE SODIUM 100 MG PO CAPS
100.0000 mg | ORAL_CAPSULE | Freq: Two times a day (BID) | ORAL | Status: DC
  Administered 2020-05-19 – 2020-05-27 (×15): 100 mg via ORAL
  Filled 2020-05-19 (×16): qty 1

## 2020-05-19 MED ORDER — ALBUTEROL SULFATE HFA 108 (90 BASE) MCG/ACT IN AERS
INHALATION_SPRAY | RESPIRATORY_TRACT | Status: AC
  Filled 2020-05-19: qty 6.7

## 2020-05-19 MED ORDER — PHENYLEPHRINE HCL (PRESSORS) 10 MG/ML IV SOLN
INTRAVENOUS | Status: AC
  Filled 2020-05-19: qty 1

## 2020-05-19 MED ORDER — ONDANSETRON HCL 4 MG/2ML IJ SOLN
INTRAMUSCULAR | Status: AC
  Administered 2020-05-19: 4 mg via INTRAVENOUS
  Filled 2020-05-19: qty 2

## 2020-05-19 MED ORDER — FENTANYL CITRATE (PF) 100 MCG/2ML IJ SOLN
25.0000 ug | INTRAMUSCULAR | Status: DC | PRN
  Administered 2020-05-19: 50 ug via INTRAVENOUS

## 2020-05-19 MED ORDER — PHENYLEPHRINE HCL-NACL 10-0.9 MG/250ML-% IV SOLN
INTRAVENOUS | Status: DC | PRN
  Administered 2020-05-19: 50 ug/min via INTRAVENOUS

## 2020-05-19 MED ORDER — ALBUTEROL SULFATE HFA 108 (90 BASE) MCG/ACT IN AERS
INHALATION_SPRAY | RESPIRATORY_TRACT | Status: DC | PRN
  Administered 2020-05-19: 4 via RESPIRATORY_TRACT

## 2020-05-19 MED ORDER — CEFAZOLIN SODIUM-DEXTROSE 2-4 GM/100ML-% IV SOLN
2.0000 g | Freq: Three times a day (TID) | INTRAVENOUS | Status: AC
  Administered 2020-05-19 – 2020-05-20 (×3): 2 g via INTRAVENOUS
  Filled 2020-05-19 (×3): qty 100

## 2020-05-19 MED ORDER — SUCCINYLCHOLINE CHLORIDE 200 MG/10ML IV SOSY
PREFILLED_SYRINGE | INTRAVENOUS | Status: AC
  Filled 2020-05-19: qty 10

## 2020-05-19 MED ORDER — VANCOMYCIN HCL 1000 MG IV SOLR
INTRAVENOUS | Status: AC
  Filled 2020-05-19: qty 1000

## 2020-05-19 MED ORDER — PHENYLEPHRINE 40 MCG/ML (10ML) SYRINGE FOR IV PUSH (FOR BLOOD PRESSURE SUPPORT)
PREFILLED_SYRINGE | INTRAVENOUS | Status: AC
  Filled 2020-05-19: qty 10

## 2020-05-19 MED ORDER — ACETAMINOPHEN 500 MG PO TABS
1000.0000 mg | ORAL_TABLET | Freq: Four times a day (QID) | ORAL | Status: AC
  Administered 2020-05-19 (×2): 1000 mg via ORAL
  Filled 2020-05-19 (×3): qty 2

## 2020-05-19 MED ORDER — ONDANSETRON HCL 4 MG/2ML IJ SOLN: 4.0000 mg | Freq: Once | INTRAMUSCULAR | Status: AC

## 2020-05-19 MED ORDER — CHLORHEXIDINE GLUCONATE 0.12 % MT SOLN
OROMUCOSAL | Status: AC
  Administered 2020-05-19: 15 mL via OROMUCOSAL
  Filled 2020-05-19: qty 15

## 2020-05-19 MED ORDER — METOCLOPRAMIDE HCL 5 MG/ML IJ SOLN: 5.0000 mg | Freq: Three times a day (TID) | INTRAMUSCULAR | Status: DC | PRN

## 2020-05-19 MED ORDER — FENTANYL CITRATE (PF) 100 MCG/2ML IJ SOLN
INTRAMUSCULAR | Status: DC | PRN
  Administered 2020-05-19: 100 ug via INTRAVENOUS
  Administered 2020-05-19 (×2): 50 ug via INTRAVENOUS

## 2020-05-19 MED ORDER — ENOXAPARIN SODIUM 40 MG/0.4ML ~~LOC~~ SOLN
40.0000 mg | SUBCUTANEOUS | Status: DC
  Administered 2020-05-20 – 2020-05-27 (×8): 40 mg via SUBCUTANEOUS
  Filled 2020-05-19 (×8): qty 0.4

## 2020-05-19 MED ORDER — DEXAMETHASONE SODIUM PHOSPHATE 10 MG/ML IJ SOLN
INTRAMUSCULAR | Status: DC | PRN
  Administered 2020-05-19: 8 mg via INTRAVENOUS

## 2020-05-19 MED ORDER — POVIDONE-IODINE 10 % EX SWAB: 2.0000 "application " | Freq: Once | CUTANEOUS | Status: DC

## 2020-05-19 MED ORDER — LIDOCAINE 2% (20 MG/ML) 5 ML SYRINGE
INTRAMUSCULAR | Status: AC
  Filled 2020-05-19: qty 10

## 2020-05-19 MED ORDER — LIDOCAINE 2% (20 MG/ML) 5 ML SYRINGE
INTRAMUSCULAR | Status: DC | PRN
  Administered 2020-05-19: 100 mg via INTRAVENOUS

## 2020-05-19 MED ORDER — FENTANYL CITRATE (PF) 100 MCG/2ML IJ SOLN
INTRAMUSCULAR | Status: AC
  Administered 2020-05-19: 50 ug via INTRAVENOUS
  Filled 2020-05-19: qty 2

## 2020-05-19 MED ORDER — MIDAZOLAM HCL 5 MG/5ML IJ SOLN
INTRAMUSCULAR | Status: DC | PRN
  Administered 2020-05-19: 2 mg via INTRAVENOUS

## 2020-05-19 MED ORDER — CHLORHEXIDINE GLUCONATE 0.12 % MT SOLN: 15.0000 mL | Freq: Once | OROMUCOSAL | Status: AC

## 2020-05-19 MED ORDER — ONDANSETRON HCL 4 MG/2ML IJ SOLN
INTRAMUSCULAR | Status: DC | PRN
  Administered 2020-05-19: 4 mg via INTRAVENOUS

## 2020-05-19 MED ORDER — 0.9 % SODIUM CHLORIDE (POUR BTL) OPTIME
TOPICAL | Status: DC | PRN
  Administered 2020-05-19: 1000 mL

## 2020-05-19 MED ORDER — OXYCODONE HCL 5 MG PO TABS
10.0000 mg | ORAL_TABLET | ORAL | Status: DC | PRN
  Administered 2020-05-19: 10 mg via ORAL
  Administered 2020-05-20 – 2020-05-21 (×3): 15 mg via ORAL
  Filled 2020-05-19 (×3): qty 3
  Filled 2020-05-19 (×2): qty 2

## 2020-05-19 MED ORDER — GABAPENTIN 300 MG PO CAPS
300.0000 mg | ORAL_CAPSULE | Freq: Two times a day (BID) | ORAL | Status: DC
  Administered 2020-05-19 – 2020-05-27 (×16): 300 mg via ORAL
  Filled 2020-05-19 (×16): qty 1

## 2020-05-19 MED ORDER — FENTANYL CITRATE (PF) 250 MCG/5ML IJ SOLN
INTRAMUSCULAR | Status: AC
  Filled 2020-05-19: qty 5

## 2020-05-19 MED ORDER — OXYCODONE HCL 5 MG PO TABS
5.0000 mg | ORAL_TABLET | ORAL | Status: DC | PRN
  Administered 2020-05-19 – 2020-05-21 (×2): 10 mg via ORAL
  Administered 2020-05-22: 5 mg via ORAL
  Filled 2020-05-19: qty 2
  Filled 2020-05-19: qty 1
  Filled 2020-05-19: qty 2

## 2020-05-19 MED ORDER — ONDANSETRON HCL 4 MG/2ML IJ SOLN: 4.0000 mg | Freq: Once | INTRAMUSCULAR | Status: DC | PRN

## 2020-05-19 MED ORDER — KETAMINE HCL 10 MG/ML IJ SOLN
INTRAMUSCULAR | Status: DC | PRN
  Administered 2020-05-19: 50 mg via INTRAVENOUS

## 2020-05-19 MED ORDER — SODIUM CHLORIDE 0.9 % IV SOLN: INTRAVENOUS | Status: DC

## 2020-05-19 MED ORDER — MIDAZOLAM HCL 2 MG/2ML IJ SOLN
INTRAMUSCULAR | Status: AC
  Filled 2020-05-19: qty 2

## 2020-05-19 MED ORDER — ONDANSETRON HCL 4 MG/2ML IJ SOLN: 4.0000 mg | Freq: Four times a day (QID) | INTRAMUSCULAR | Status: DC | PRN

## 2020-05-19 MED ORDER — ONDANSETRON HCL 4 MG PO TABS: 4.0000 mg | ORAL_TABLET | Freq: Four times a day (QID) | ORAL | Status: DC | PRN

## 2020-05-19 SURGICAL SUPPLY — 72 items
BIT DRILL 4.3 (BIT) ×2
BIT DRILL 4.3MM (BIT) ×1
BIT DRILL 4.3X300MM (BIT) IMPLANT
BIT DRILL LONG 3.3 (BIT) ×1 IMPLANT
BIT DRILL LONG 3.3MM (BIT) ×1
BIT DRILL QC 3.3X195 (BIT) ×2 IMPLANT
BLADE CLIPPER SURG (BLADE) IMPLANT
BNDG COHESIVE 6X5 TAN STRL LF (GAUZE/BANDAGES/DRESSINGS) ×3 IMPLANT
BNDG ELASTIC 4X5.8 VLCR STR LF (GAUZE/BANDAGES/DRESSINGS) ×2 IMPLANT
BNDG ELASTIC 6X10 VLCR STRL LF (GAUZE/BANDAGES/DRESSINGS) ×5 IMPLANT
BRUSH SCRUB EZ PLAIN DRY (MISCELLANEOUS) ×6 IMPLANT
CANISTER SUCT 3000ML PPV (MISCELLANEOUS) ×1 IMPLANT
CAP LOCK NCB (Cap) ×24 IMPLANT
CHLORAPREP W/TINT 26 (MISCELLANEOUS) ×5 IMPLANT
COVER SURGICAL LIGHT HANDLE (MISCELLANEOUS) ×3 IMPLANT
DRAPE C-ARM 42X72 X-RAY (DRAPES) ×3 IMPLANT
DRAPE C-ARMOR (DRAPES) ×3 IMPLANT
DRAPE HALF SHEET 40X57 (DRAPES) ×6 IMPLANT
DRAPE ORTHO SPLIT 77X108 STRL (DRAPES) ×4
DRAPE SURG 17X23 STRL (DRAPES) ×3 IMPLANT
DRAPE SURG ORHT 6 SPLT 77X108 (DRAPES) ×2 IMPLANT
DRAPE U-SHAPE 47X51 STRL (DRAPES) ×3 IMPLANT
DRSG ADAPTIC 3X8 NADH LF (GAUZE/BANDAGES/DRESSINGS) IMPLANT
DRSG MEPILEX BORDER 4X12 (GAUZE/BANDAGES/DRESSINGS) ×2 IMPLANT
DRSG MEPILEX BORDER 4X4 (GAUZE/BANDAGES/DRESSINGS) IMPLANT
DRSG MEPILEX BORDER 4X8 (GAUZE/BANDAGES/DRESSINGS) IMPLANT
DRSG MEPILEX SACRM 8.7X9.8 (GAUZE/BANDAGES/DRESSINGS) ×2 IMPLANT
DRSG PAD ABDOMINAL 8X10 ST (GAUZE/BANDAGES/DRESSINGS) ×9 IMPLANT
ELECT REM PT RETURN 9FT ADLT (ELECTROSURGICAL) ×3
ELECTRODE REM PT RTRN 9FT ADLT (ELECTROSURGICAL) ×1 IMPLANT
GAUZE SPONGE 4X4 12PLY STRL (GAUZE/BANDAGES/DRESSINGS) ×3 IMPLANT
GLOVE BIO SURGEON STRL SZ 6.5 (GLOVE) ×6 IMPLANT
GLOVE BIO SURGEON STRL SZ7.5 (GLOVE) ×12 IMPLANT
GLOVE BIO SURGEONS STRL SZ 6.5 (GLOVE) ×3
GLOVE BIOGEL PI IND STRL 6.5 (GLOVE) ×1 IMPLANT
GLOVE BIOGEL PI IND STRL 7.5 (GLOVE) ×1 IMPLANT
GLOVE BIOGEL PI INDICATOR 6.5 (GLOVE) ×2
GLOVE BIOGEL PI INDICATOR 7.5 (GLOVE) ×2
GOWN STRL REUS W/ TWL LRG LVL3 (GOWN DISPOSABLE) ×3 IMPLANT
GOWN STRL REUS W/TWL LRG LVL3 (GOWN DISPOSABLE) ×6
K-WIRE 2.0 (WIRE) ×2
K-WIRE FXSTD 280X2XNS SS (WIRE) ×1
KIT BASIN OR (CUSTOM PROCEDURE TRAY) ×3 IMPLANT
KIT TURNOVER KIT B (KITS) ×3 IMPLANT
KWIRE FXSTD 280X2XNS SS (WIRE) IMPLANT
NS IRRIG 1000ML POUR BTL (IV SOLUTION) ×3 IMPLANT
PACK TOTAL JOINT (CUSTOM PROCEDURE TRAY) ×3 IMPLANT
PAD ARMBOARD 7.5X6 YLW CONV (MISCELLANEOUS) ×6 IMPLANT
PAD CAST 4YDX4 CTTN HI CHSV (CAST SUPPLIES) ×1 IMPLANT
PADDING CAST COTTON 4X4 STRL (CAST SUPPLIES) ×2
PADDING CAST COTTON 6X4 STRL (CAST SUPPLIES) ×3 IMPLANT
PLATE DISTAL FEMUR 18H 355MM (Plate) ×2 IMPLANT
SCREW 5.0 48MM (Screw) ×2 IMPLANT
SCREW CORTICAL 5.0X14 (Screw) ×2 IMPLANT
SCREW CORTICAL 5.0X95MM (Screw) ×6 IMPLANT
SCREW CORTICAL NCB 5.0X40 (Screw) ×6 IMPLANT
SCREW CORTICAL NCB 5.0X90MM (Screw) ×6 IMPLANT
SCREW CORTICAL NCB 5X12 (Screw) ×2 IMPLANT
SCREW NCB 5.0X38 (Screw) ×2 IMPLANT
SPONGE LAP 18X18 RF (DISPOSABLE) IMPLANT
STAPLER VISISTAT 35W (STAPLE) ×3 IMPLANT
SUCTION FRAZIER HANDLE 10FR (MISCELLANEOUS) ×2
SUCTION TUBE FRAZIER 10FR DISP (MISCELLANEOUS) ×1 IMPLANT
SUT ETHILON 3 0 PS 1 (SUTURE) ×6 IMPLANT
SUT VIC AB 0 CT1 27 (SUTURE) ×2
SUT VIC AB 0 CT1 27XBRD ANBCTR (SUTURE) IMPLANT
SUT VIC AB 1 CT1 27 (SUTURE)
SUT VIC AB 1 CT1 27XBRD ANBCTR (SUTURE) IMPLANT
SUT VIC AB 2-0 CT1 27 (SUTURE) ×2
SUT VIC AB 2-0 CT1 TAPERPNT 27 (SUTURE) ×2 IMPLANT
TOWEL GREEN STERILE (TOWEL DISPOSABLE) ×6 IMPLANT
WATER STERILE IRR 1000ML POUR (IV SOLUTION) ×2 IMPLANT

## 2020-05-19 NOTE — Plan of Care (Signed)

## 2020-05-19 NOTE — Op Note (Signed)
Orthopaedic Surgery Operative Note (CSN: [id number] ) Date of Surgery: 05/19/2020  Admit Date: 05/18/2020   Diagnoses: Pre-Op Diagnoses: Comminuted left supracondylar distal femur fracture with femoral shaft extension  Post-Op Diagnosis: Same  Procedures: 1. CPT 27513-Open reduction internal fixation of left supracondylar distal femur fracture 2. CPT 54656-CLEX reduction internal fixation of left femoral shaft fracture  Surgeons : Primary: Mirtha Jain, Thomasene Lot, MD  Assistant: Patrecia Pace, PA-C  Location: OR 3   Anesthesia:General  Antibiotics: Ancef 2g preop with 1 gm vancomycin powder placed topically   Tourniquet time:None  Estimated Blood NTZG:017 mL  Complications:None  Specimens:None   Implants: Implant Name Type Inv. Item Serial No. Manufacturer Lot No. LRB No. Used Action  SCREW CORTICAL 5.0X95MM - [id number] Screw SCREW CORTICAL 5.0X95MM  [employer](ORTH,TRAU,BIO,SG) [id number] Left 1 Implanted  SCREW CORTICAL NCB 5.0X90MM - [id number] Screw SCREW CORTICAL NCB 5.0X90MM  [employer](ORTH,TRAU,BIO,SG) [id number] Left 1 Implanted  PLATE DISTAL FEMUR 09Q 355MM - [id number] Plate PLATE DISTAL FEMUR 33H 355MM  [employer](ORTH,TRAU,BIO,SG)  Left 1 Implanted  SCREW CORTICAL NCB 5.0X40 - [id number] Screw SCREW CORTICAL NCB 5.0X40  [employer](ORTH,TRAU,BIO,SG)  Left 3 Implanted  SCREW NCB 5.0X38 - [id number] Screw SCREW NCB 5.0X38  [employer](ORTH,TRAU,BIO,SG)  Left 1 Implanted  5.0 NCB SCREW Size 12 Femur   [employer]THO [employer]  Left 1 Implanted  SCREW CORTICAL 5.0X14 - [id number] Screw SCREW CORTICAL 5.0X14  [employer](ORTH,TRAU,BIO,SG)  Left 1 Implanted  SCREW CORTICAL NCB 5.0X90MM - [id number] Screw SCREW CORTICAL NCB 5.0X90MM  [employer](ORTH,TRAU,BIO,SG) [id number] Left 1 Implanted  SCREW CORTICAL 5.0X95MM - [id number] Screw SCREW CORTICAL 5.0X95MM  [employer](ORTH,TRAU,BIO,SG) [id number] Left 1 Implanted  SCREW CORTICAL 5.0X95MM - [id number] Screw SCREW CORTICAL 5.0X95MM   [employer](ORTH,TRAU,BIO,SG) [id number] Left 1 Implanted  SCREW CORTICAL NCB 5.0X90MM - [id number] Screw SCREW CORTICAL NCB 5.0X90MM  [employer](ORTH,TRAU,BIO,SG) [id number] Left 1 Implanted  CAP LOCK NCB - [id number] Cap CAP LOCK NCB  [employer](ORTH,TRAU,BIO,SG)  Left 12 Implanted     Indications for Surgery: 69 year old male who sustained a ground-level fall with a comminuted supracondylar distal femur fracture with intra-articular extension.  There is also significant involvement of the femoral shaft.  He had a previous total hip arthroplasty.  I recommended proceeding to the operating room for open reduction internal fixation of his left femur.  Risks and benefits were discussed with the patient.  Risks included but not limited to bleeding, infection, malunion, nonunion, hardware failure, hardware irritation, nerve or blood vessel injury, DVT, even the possibility anesthetic complications.  Patient agreed to proceed with surgery and consent was obtained.  Operative Findings: 1.  Comminuted supracondylar distal femur fracture with nondisplaced intra-articular extension.  There is also significant comminution and involvement of the femoral shaft.  The proximal segment of the femoral shaft was buttonholed through the quadriceps muscle. 2.  Open reduction internal fixation of left femur using [employer] NCB distal femoral locking plate 18 hole.  Procedure: The patient was identified in the preoperative holding area. Consent was confirmed with the patient and their family and all questions were answered. The operative extremity was marked after confirmation with the patient. he was then brought back to the operating room by our anesthesia colleagues.  He was placed under general anesthetic.  He was carefully transferred over to a radiolucent flat top table.  A bump was placed under his operative hip.  The left lower extremity was then prepped and draped in usual sterile fashion.  A timeout was  performed to verify  the patient, the procedure, and the extremity.  Preoperative antibiotics were dosed.  Fluoroscopic imaging was obtained to show the unstable nature of his injury.  His proximal femoral shaft was significantly displaced and on the CT scan that was obtained it was buttonholed through the quadriceps muscle.  I flexed his hip and knee over a triangle and then made a direct lateral approach to the distal femur.  I felt that the nondisplaced intra-articular split did not require a formal visualization of the fracture.  I split the IT band in line with my incision.  I then mobilized the vastus lateralis off of the lateral aspect of the distal femur.  I exposed the lateral aspect of the distal articular surface of the femur.  Through my approach I was then able to use a Cobb elevator to manipulate the proximal fragment of the femoral shaft.  I was able to reduce it and restore it to appropriate alignment after it was removed from the quadriceps muscle. Fluoroscopic imaging was obtained that showed decent alignment flexed over the triangle.  I chose to use an 18 hole [employer] NCB distal femoral locking plate and attached to the targeting arm.  I slid this submuscularly along the lateral cortex of the femoral shaft.  I then appropriate aligned it distally in the distal articular segment.  I then placed a 2.0 mm K wire to provisionally hold the distal portion of the plate.  Aligning the fracture both coronally and sagittally I then placed a percutaneous 3.3 mm drill bit just distal to the stem of the femoral prosthesis.  This was able to hold the proximal portion of the plate.  Once I was pleased with the alignment of the plate and position of the plate I then placed 5.0 millimeter screws in the distal segment to bring the plate flush to bone.  I then placed percutaneous 5.0 millimeter screws through the targeting arm to correct the coronal alignment of the fracture.  I then placed a total of four  5.0 millimeter screws in the femoral shaft.  I placed locking caps on all of these screws.  I then placed 2 unicortical screws proximal to the tip of the femoral prosthesis.  Locking caps were placed on these as well.  I returned to the distal segment and proceeded to place a total of six 5.0 millimeter screws.  Locking caps were placed on the distal screws.  Final fluoroscopic imaging was obtained.  The incisions were copiously irrigated.  A gram of vancomycin powder was placed into the incision.  A layer closure of 0 Vicryl, 2-0 Vicryl and 3-0 nylon was used to close the skin.  A sterile dressing was placed consisting of Mepitel, 4 x 4's, sterile cast padding and Ace wrap.  The patient was then awoken from anesthesia and taken to the PACU in stable condition.  Post Op Plan/Instructions: The patient will be touchdown weightbearing to the left lower extremity.  He will receive postoperative Ancef.  He will receive Lovenox for DVT prophylaxis.  We will mobilize him with physical and Occupational Therapy.  I was present and performed the entire surgery.  Patrecia Pace, PA-C did assist me throughout the case. An assistant was necessary given the difficulty in approach, maintenance of reduction and ability to instrument the fracture.   Katha Hamming, MD [facility]

## 2020-05-19 NOTE — Anesthesia Procedure Notes (Signed)
Procedure Name: Intubation Date/Time: 05/19/2020 11:16 AM Performed by: Inda Coke, CRNA Pre-anesthesia Checklist: Patient identified, Emergency Drugs available, Suction available and Patient being monitored Patient Re-evaluated:Patient Re-evaluated prior to induction Oxygen Delivery Method: Circle System Utilized Preoxygenation: Pre-oxygenation with 100% oxygen Induction Type: IV induction Ventilation: Mask ventilation without difficulty and Oral airway inserted - appropriate to patient size Laryngoscope Size: Mac and 4 Grade View: Grade I Tube type: Oral Tube size: 7.5 mm Number of attempts: 1 Airway Equipment and Method: Stylet and Oral airway Placement Confirmation: ETT inserted through vocal cords under direct vision,  positive ETCO2 and breath sounds checked- equal and bilateral Secured at: 22 cm Tube secured with: Tape Dental Injury: Teeth and Oropharynx as per pre-operative assessment

## 2020-05-19 NOTE — Interval H&P Note (Signed)
History and Physical Interval Note:  05/19/2020 10:21 AM  Matthew [name]  has presented today for surgery, with the diagnosis of Left distal femur fx.  The various methods of treatment have been discussed with the patient and family. After consideration of risks, benefits and other options for treatment, the patient has consented to  Procedure(s): OPEN REDUCTION INTERNAL FIXATION (ORIF) DISTAL FEMUR FRACTURE (Left) as a surgical intervention.  The patient's history has been reviewed, patient examined, no change in status, stable for surgery.  I have reviewed the patient's chart and labs.  Questions were answered to the patient's satisfaction.     Lennette Bihari P Dyland Panuco

## 2020-05-19 NOTE — Anesthesia Preprocedure Evaluation (Addendum)
Anesthesia Evaluation  Patient identified by MRN, date of birth, ID band Patient awake    Reviewed: Allergy & Precautions, NPO status , Patient's Chart, lab work & pertinent test results  Airway Mallampati: II  TM Distance: >3 FB Neck ROM: Full    Dental  (+) Poor Dentition, Missing   Pulmonary asthma ,    Pulmonary exam normal breath sounds clear to auscultation       Cardiovascular hypertension, Pt. on medications Normal cardiovascular exam Rhythm:Regular Rate:Normal     Neuro/Psych negative neurological ROS  negative psych ROS   GI/Hepatic negative GI ROS, Neg liver ROS,   Endo/Other  negative endocrine ROS  Renal/GU Renal InsufficiencyRenal disease     Musculoskeletal Left distal femur fx chronic pain on buprenorphine   Abdominal   Peds  Hematology  (+) Blood dyscrasia, anemia ,   Anesthesia Other Findings Day of surgery medications reviewed with the patient.  Reproductive/Obstetrics                            Anesthesia Physical Anesthesia Plan  ASA: II  Anesthesia Plan: General   Post-op Pain Management:    Induction: Intravenous  PONV Risk Score and Plan: 3 and Dexamethasone, Ondansetron and Treatment may vary due to age or medical condition  Airway Management Planned: Oral ETT  Additional Equipment:   Intra-op Plan:   Post-operative Plan: Extubation in OR  Informed Consent: I have reviewed the patients History and Physical, chart, labs and discussed the procedure including the risks, benefits and alternatives for the proposed anesthesia with the patient or authorized representative who has indicated his/her understanding and acceptance.       Plan Discussed with: CRNA  Anesthesia Plan Comments:         Anesthesia Quick Evaluation
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
## 2020-05-19 NOTE — TOC CAGE-AID Note (Signed)
Transition of Care [facility]) - CAGE-AID Screening   Patient Details  Name: Matthew [name] MRN: [id number] Date of Birth: June 16, 1951  Transition of Care [facility]) CM/SW Contact:    Emeterio Reeve, [place] Phone Number: 05/19/2020, 4:05 PM   Clinical Narrative:  Pt was unable to participate in assessment due to only being oriented x2.   CAGE-AID Screening: Substance Abuse Screening unable to be completed due to: : Patient unable to participate            [place] [name] Clinical Social Worker [phone]

## 2020-05-19 NOTE — Progress Notes (Signed)
Patient reoriented to time, place, and situation. Expressed to patient the importance of using the call bell for assistance and to stay in bed for safety. Patient acknowledge the plan. Call bell in reach , bed low, and bed alarm on.

## 2020-05-19 NOTE — Transfer of Care (Signed)
Immediate Anesthesia Transfer of Care Note  Patient: Matthew [name]  Procedure(s) Performed: OPEN REDUCTION INTERNAL FIXATION (ORIF) DISTAL FEMUR FRACTURE (Left )  Patient Location: PACU  Anesthesia Type:General  Level of Consciousness: awake and alert   Airway & Oxygen Therapy: Patient Spontanous Breathing and Patient connected to face mask oxygen  Post-op Assessment: Report given to RN, Post -op Vital signs reviewed and stable and Patient moving all extremities X 4  Post vital signs: Reviewed and stable  Last Vitals:  Vitals Value Taken Time  BP 137/65 05/19/20 1317  Temp    Pulse 102 05/19/20 1318  Resp 18 05/19/20 1318  SpO2 100 % 05/19/20 1318  Vitals shown include unvalidated device data.  Last Pain:  Vitals:   05/19/20 0728  TempSrc: Oral  PainSc:       Patients Stated Pain Goal: 3 (15/18/34 3735)  Complications: No complications documented.

## 2020-05-19 NOTE — Progress Notes (Signed)
I-stat results called to Dr. Gifford Shave.

## 2020-05-20 ENCOUNTER — Other Ambulatory Visit: Payer: Self-pay | Admitting: *Deleted

## 2020-05-20 ENCOUNTER — Other Ambulatory Visit: Payer: Self-pay

## 2020-05-20 LAB — CBC WITH DIFFERENTIAL/PLATELET
Abs Immature Granulocytes: 0.05 10*3/uL (ref 0.00–0.07)
Basophils Absolute: 0 10*3/uL (ref 0.0–0.1)
Basophils Relative: 0 %
Eosinophils Absolute: 0 10*3/uL (ref 0.0–0.5)
Eosinophils Relative: 0 %
HCT: 34.5 % — ABNORMAL LOW (ref 39.0–52.0)
Hemoglobin: 10.7 g/dL — ABNORMAL LOW (ref 13.0–17.0)
Immature Granulocytes: 0 %
Lymphocytes Relative: 6 %
Lymphs Abs: 0.7 10*3/uL (ref 0.7–4.0)
MCH: 34.5 pg — ABNORMAL HIGH (ref 26.0–34.0)
MCHC: 31 g/dL (ref 30.0–36.0)
MCV: 111.3 fL — ABNORMAL HIGH (ref 80.0–100.0)
Monocytes Absolute: 1.4 10*3/uL — ABNORMAL HIGH (ref 0.1–1.0)
Monocytes Relative: 11 %
Neutro Abs: 10.4 10*3/uL — ABNORMAL HIGH (ref 1.7–7.7)
Neutrophils Relative %: 83 %
Platelets: 257 10*3/uL (ref 150–400)
RBC: 3.1 MIL/uL — ABNORMAL LOW (ref 4.22–5.81)
RDW: 14 % (ref 11.5–15.5)
WBC: 12.5 10*3/uL — ABNORMAL HIGH (ref 4.0–10.5)
nRBC: 0 % (ref 0.0–0.2)

## 2020-05-20 LAB — COMPREHENSIVE METABOLIC PANEL
ALT: 23 U/L (ref 0–44)
AST: 26 U/L (ref 15–41)
Albumin: 2.7 g/dL — ABNORMAL LOW (ref 3.5–5.0)
Alkaline Phosphatase: 98 U/L (ref 38–126)
Anion gap: 10 (ref 5–15)
BUN: 42 mg/dL — ABNORMAL HIGH (ref 8–23)
CO2: 20 mmol/L — ABNORMAL LOW (ref 22–32)
Calcium: 9.8 mg/dL (ref 8.9–10.3)
Chloride: 106 mmol/L (ref 98–111)
Creatinine, Ser: 2.78 mg/dL — ABNORMAL HIGH (ref 0.61–1.24)
GFR calc non Af Amer: 22 mL/min — ABNORMAL LOW (ref >60)
Glucose, Bld: 133 mg/dL — ABNORMAL HIGH (ref 70–99)
Potassium: 5 mmol/L (ref 3.5–5.1)
Sodium: 136 mmol/L (ref 135–145)
Total Bilirubin: 0.3 mg/dL (ref 0.3–1.2)
Total Protein: 6.2 g/dL — ABNORMAL LOW (ref 6.5–8.1)

## 2020-05-20 LAB — SARS CORONAVIRUS 2 BY RT PCR (HOSPITAL ORDER, PERFORMED IN ~~LOC~~ HOSPITAL LAB): SARS Coronavirus 2: NEGATIVE

## 2020-05-20 LAB — VITAMIN D 25 HYDROXY (VIT D DEFICIENCY, FRACTURES): Vit D, 25-Hydroxy: 20.39 ng/mL — ABNORMAL LOW (ref 30–100)

## 2020-05-20 MED ORDER — SODIUM CHLORIDE 0.9 % IV SOLN: INTRAVENOUS | Status: DC

## 2020-05-20 MED ORDER — SODIUM CHLORIDE 0.9% FLUSH
10.0000 mL | Freq: Two times a day (BID) | INTRAVENOUS | Status: DC
  Administered 2020-05-21 – 2020-05-26 (×11): 10 mL
  Administered 2020-05-27: 3 mL

## 2020-05-20 MED ORDER — SODIUM CHLORIDE 0.9% FLUSH: 10.0000 mL | INTRAVENOUS | Status: DC | PRN

## 2020-05-20 MED ORDER — ALBUTEROL SULFATE HFA 108 (90 BASE) MCG/ACT IN AERS: 1.0000 | INHALATION_SPRAY | Freq: Every day | RESPIRATORY_TRACT | 4 refills | Status: DC

## 2020-05-20 NOTE — Progress Notes (Signed)
PROGRESS NOTE    Matthew [name]  BSW:[id number] DOB: 1951-04-01 DOA: 05/18/2020 PCP: Tracie Harrier, MD  Brief Narrative:  69 year old male known history of asthma HTN, depression, chronic pain on buprenorphine, CKD stage III Last admitted to the hospital 10/2019 with acute asthma exacerbation Presented from home 05/18/2020 with fall leg gave way, no LOC although hit head x-ray distal left femur showed fracture   Assessment & Plan:   Principal Problem:   Closed comminuted intra-articular fracture of distal femur ([place]) Active Problems:   Asthma   Essential hypertension   Femur fracture, left ([facility])   Displaced comminuted fracture of shaft of left femur, initial encounter for closed fracture ([place])   1. Left femoral fracture status post ORIF left supracondylar distal femur fracture + left femoral shaft fracture 10/6 a. Pain control, weightbearing status, etc. as per Ortho 2. Acute superimposed on chronic CKD stage III' a. Toradol discontinued 10/7 b. Normal saline rate increased 10/7 from 75 to 125 cc/H given rising creatinine  c. bladder scan, if >150 cc to place foley 3. 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 Desats down to 88-89 off oxygen b. No cough no cold but will get nonemergent chest x-ray in morning 10/8 c.  may require oxygen on discharge to skilled facility 4. Probable OSA given chronic pain on buprenorphine a. Usually on butrans which has been held b. Resume tizanidine c. Once acute pain component is improved after several weeks at facility will probably be able to resume the Butrans 5. Depression a. Resume Cymblata 60 bid 6. HTN a. 2/2 azotemia, held ACE lisinopril 10  DVT prophylaxis: loveneox Code Status: Full Family Communication: none + Disposition:   Status is: Inpatient  Remains inpatient appropriate because:Persistent severe electrolyte disturbances and IV treatments appropriate due to intensity of illness or inability to take PO   Dispo: The patient is  from: Home              Anticipated d/c is to: SNF              Anticipated d/c date is: 2 days              Patient currently is not medically stable to d/c.       Consultants:   ortho  Procedures: nad  Antimicrobials: na    Subjective: moderate pain 4/10-5/10 No fever no chills Tolerating diet No cough or cold No sputum Does desat down to high 80s on O2 sat at times  Objective: Vitals:   05/19/20 1515 05/19/20 1929 05/19/20 2340 05/20/20 0354  BP:  (!) 146/80  (!) 160/78  Pulse: 99 (!) 110  (!) 104  Resp:  19  18  Temp:  97.6 F (36.4 C)  98.8 F (37.1 C)  TempSrc:  Oral  Oral  SpO2:  (!) 88% 97% 99%  Weight:      Height:        Intake/Output Summary (Last 24 hours) at 05/20/2020 1025 Last data filed at 05/20/2020 0900 Gross per 24 hour  Intake 2871.43 ml  Output 1450 ml  Net 1421.43 ml   Filed Weights   05/19/20 0828  Weight: 133.8 kg    Examination:  General exam: eomi ncat obese pleasant in and no focal deficit Respiratory system: cta b now added sound Cardiovascular system: s1 s2 no m/r/g Gastrointestinal system: abd soft nt dn no rebound no gaurd. Central nervous system: neuro intact Extremities: nad--hass knee immobilizer LLE Skin: no edema Psychiatry:  Intact euthymic  Data Reviewed: I have personally reviewed following labs and imaging studies Potassium 6.0-->5.5-->5.0 Bun/creat 30/2.3--->39/2.6-->42/2.7 Salmi count 11.1-->12.5 Hemoglobin 12.8-->11.2-->10.7  Radiology Studies: CT KNEE LEFT WO CONTRAST  Result Date: 05/18/2020 CLINICAL DATA:  Evaluate distal left femur fracture EXAM: CT OF THE LEFT KNEE WITHOUT CONTRAST TECHNIQUE: Multidetector CT imaging of the left knee was performed according to the standard protocol. Multiplanar CT image reconstructions were also generated. COMPARISON:  X-ray 05/17/2020 FINDINGS: Bones/Joint/Cartilage Heavily comminuted fracture of the distal left femoral metadiaphysis. Approximately one shaft width  posterior displacement and valgus angulation at the fracture site. Nondisplaced fracture line extends the articular surfaces of the medial and lateral trochlea and to the inferior aspect of the lateral femoral condyle adjacent to the intercondylar notch (series 3, images 114-128). No evidence of an underlying bony lesion. Moderate-sized knee joint lipohemarthrosis. Proximal aspects of the tibia and fibula are intact without fracture. Patella is intact. 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 Extensive chondrocalcinosis. Ligaments Suboptimally assessed by CT. Muscles and Tendons Musculotendinous structures appear intact. Soft tissues Focal hematoma measuring approximately 3 cm at the proximal fracture margin (series 4, image 31). Additional more ill-defined fluid and blood products at the fracture site. No soft tissue air or intra-articular air. IMPRESSION: 1. Comminuted, displaced, and angulated distal left femoral metadiaphysis with intra-articular extension to the knee joint. 2. Moderate-sized knee joint lipohemarthrosis. 3. 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 Electronically Signed   By: Davina Poke D.O.   On: 05/18/2020 13:11   DG Knee Left Port  Result Date: 05/19/2020 CLINICAL DATA:  Postop distal femur fracture. EXAM: PORTABLE LEFT KNEE - 1-2 VIEW COMPARISON:  Preoperative imaging. FINDINGS: Lateral plate and multi screw fixation traverses comminuted distal femur fracture. Fracture is in improved alignment compared to preoperative imaging. Left hip arthroplasty is partially included. Recent postsurgical change includes air and edema in the soft tissues. IMPRESSION: ORIF of comminuted distal femur fracture, in improved alignment compared to preoperative imaging. Electronically Signed   By: Keith Rake M.D.   On: 05/19/2020 16:54   DG C-Arm 1-60  Min  Result Date: 05/19/2020 CLINICAL DATA:  Left femur ORIF EXAM: LEFT FEMUR 2 VIEWS; DG C-ARM 1-60 MIN COMPARISON:  05/18/2020 FINDINGS: 13 C-arm fluoroscopic images were obtained intraoperatively and submitted for post operative interpretation. Findings demonstrate placement of lateral sideplate and screw fixation construct traversing distal left femoral metadiaphyseal fracture. Alignment is improved compared to preoperative imaging. 2 minutes and 55 seconds of fluoroscopy time was utilized. Please see the performing provider's procedural report for further detail. IMPRESSION: As above. Electronically Signed   By: Davina Poke D.O.   On: 05/19/2020 13:16   DG FEMUR MIN 2 VIEWS LEFT  Result Date: 05/19/2020 CLINICAL DATA:  Left femur ORIF EXAM: LEFT FEMUR 2 VIEWS; DG C-ARM 1-60 MIN COMPARISON:  05/18/2020 FINDINGS: 13 C-arm fluoroscopic images were obtained intraoperatively and submitted for post operative interpretation. Findings demonstrate placement of lateral sideplate and screw fixation construct traversing distal left femoral metadiaphyseal fracture. Alignment is improved compared to preoperative imaging. 2 minutes and 55 seconds of fluoroscopy time was utilized. Please see the performing provider's procedural report for further detail. IMPRESSION: As above. Electronically Signed   By: Davina Poke D.O.   On: 05/19/2020 13:16     Scheduled Meds: . acetaminophen  1,000 mg Oral Q6H  . docusate sodium  100 mg Oral BID  . enoxaparin (LOVENOX) injection  40 mg Subcutaneous Q24H  . gabapentin  300 mg Oral BID  . influenza vaccine adjuvanted  0.5 mL Intramuscular Tomorrow-1000  . multivitamin with minerals  1 tablet Oral Daily  . Ensure Max Protein  11 oz Oral QHS   Continuous Infusions: . sodium chloride 75 mL/hr at 05/20/20 0520  . methocarbamol (ROBAXIN) IV       LOS: 2 days    Time spent: [place], MD [facility] To contact the attending provider  between 7A-7P or the covering provider during after hours 7P-7A, please log into the web site [email or web address] and access using universal [place] password for that web site. If you do not have the password, please call the hospital operator.  05/20/2020, 10:25 AM

## 2020-05-20 NOTE — Evaluation (Signed)
Occupational Therapy Evaluation Patient Details Name: Matthew [name] MRN: [id number] DOB: [birth year] Today's Date: 05/20/2020    History of Present Illness 69 year old male known history of asthma HTN, depression, chronic pain on buprenorphine, CKD stage IIILast admitted to the hospital 10/2019 with acute asthma exacerbationPresented from home 05/18/2020 with fall leg gave way, no LOC although hit head x-ray distal left femur showed fracture. S/P L femur ORIF   Clinical Impression   Patient admitted with the above diagnosis.  Patient sleeping upon entering, feeling a little better than this morning.  Agreed for attempted lateral scoot to recliner.  L LE TD weight bearing status, L knee pain, R shin pain, decreased activity tolerance, O2 sats, generalized weakness and body habitus are limiting independence with self care, toilet skills and basic mobility.  Patient was independent at baseline with self care, functional mobility, community mobility, home management and med management.  OT recommended +2 lateral scoot to nursing.  OT recommended no more than 2 hours in the recliner so he would not be over fatigued getting back to the recliner, patient verbalized understanding.  Patient educated on strong side lateral scoot, with good follow through.  OT will continue in the acute care setting.  Currently, patient would not have adequate support at home, recommend SNF.  O2 sats dropped to 78% on 2 L via [place].  O2 increased to 4 L and O2 rebounded to 91% after 3 min.  O2 left at 3 L to prevent continued de-sat.      Follow Up Recommendations  SNF    Equipment Recommendations  3 in 1 bedside commode;Tub/shower bench;Wheelchair (measurements OT);Wheelchair cushion (measurements OT);Hospital bed    Recommendations for Other Services       Precautions / Restrictions Precautions Precautions: Fall Precaution Comments: Monitor O2 Restrictions Weight Bearing Restrictions: Yes LLE Weight Bearing: Touchdown  weight bearing Other Position/Activity Restrictions: No bending restrictions to L knee      Mobility Bed Mobility Overal bed mobility: Needs Assistance Bed Mobility: Supine to Sit     Supine to sit: Mod assist;HOB elevated        Transfers Overall transfer level: Needs assistance   Transfers: Lateral/Scoot Transfers          Lateral/Scoot Transfers: Mod assist General transfer comment: Recommended +2 for nursing.  Patient able to push through B UE's and slowly scoot bottom to the recliner.  Min A to reposition in chair with arm rests up.  VC's for WBS through LLE    Balance   Sitting-balance support: Single extremity supported Sitting balance-Leahy Scale: Fair                                     ADL either performed or assessed with clinical judgement   ADL Overall ADL's : Needs assistance/impaired Eating/Feeding: Independent;Sitting   Grooming: Set up;Sitting   Upper Body Bathing: Moderate assistance;Bed level   Lower Body Bathing: Maximal assistance;Bed level       Lower Body Dressing: Total assistance;Bed level       Toileting- Clothing Manipulation and Hygiene: Total assistance;Bed level       Functional mobility during ADLs: Maximal assistance General ADL Comments: lateral scoot with assist to support L leg.     Vision Patient Visual Report: No change from baseline       Perception     Praxis      Pertinent Vitals/Pain Pain Assessment: Faces Faces  Pain Scale: Hurts little more Pain Location: L knee and R shin. Pain Descriptors / Indicators: Discomfort;Guarding Pain Intervention(s): Limited activity within patient's tolerance;Monitored during session;Repositioned     Hand Dominance Right   Extremity/Trunk Assessment Upper Extremity Assessment Upper Extremity Assessment: Overall WFL for tasks assessed   Lower Extremity Assessment Lower Extremity Assessment: Defer to PT evaluation   Cervical / Trunk  Assessment Cervical / Trunk Assessment: Normal   Communication Communication Communication: No difficulties   Cognition Arousal/Alertness: Awake/alert Behavior During Therapy: WFL for tasks assessed/performed Overall Cognitive Status: Within Functional Limits for tasks assessed                                                      Home Living Family/patient expects to be discharged to:: Private residence Living Arrangements: Spouse/significant other Available Help at Discharge: Family Type of Home: House Home Access: Stairs to enter Technical brewer of Steps: 5 Entrance Stairs-Rails: Right;Left;Can reach both Home Layout: One level     Bathroom Shower/Tub: Teacher, early years/pre: Standard Bathroom Accessibility: No   Home Equipment: None          Prior Functioning/Environment Level of Independence: Independent                 OT Problem List: Decreased strength;Decreased activity tolerance;Impaired balance (sitting and/or standing);Decreased knowledge of use of DME or AE;Decreased knowledge of precautions;Pain;Obesity      OT Treatment/Interventions: Self-care/ADL training;Therapeutic exercise;Neuromuscular education;Energy conservation;DME and/or AE instruction;Therapeutic activities;Patient/family education;Balance training    OT Goals(Current goals can be found in the care plan section) Acute Rehab OT Goals Patient Stated Goal: I'd like to learn how I'm going to move. OT Goal Formulation: With patient Time For Goal Achievement: 06/03/20 Potential to Achieve Goals: Good ADL Goals Pt Will Perform Grooming: sitting;with set-up Pt Will Perform Lower Body Bathing: with mod assist;sit to/from stand Pt Will Perform Lower Body Dressing: with mod assist;sit to/from stand Pt Will Transfer to Toilet: stand pivot transfer;with mod assist Pt Will Perform Toileting - Clothing Manipulation and hygiene: with supervision;sit to/from  stand  OT Frequency: Min 2X/week   Barriers to D/C: Decreased caregiver support          Co-evaluation              AM-PAC OT "6 Clicks" Daily Activity     Outcome Measure Help from another person eating meals?: None Help from another person taking care of personal grooming?: A Little Help from another person toileting, which includes using toliet, bedpan, or urinal?: A Lot Help from another person bathing (including washing, rinsing, drying)?: A Lot Help from another person to put on and taking off regular upper body clothing?: A Lot Help from another person to put on and taking off regular lower body clothing?: Total 6 Click Score: 14   End of Session Equipment Utilized During Treatment: Oxygen Nurse Communication: Mobility status (+2 lateral scoot)  Activity Tolerance: Other (comment) (Treatment to tolerance.  O2 monitored) Patient left: in chair;with call bell/phone within reach;with chair alarm set  OT Visit Diagnosis: Unsteadiness on feet (R26.81);Muscle weakness (generalized) (M62.81);Pain Pain - Right/Left: Left Pain - part of body: Knee;Leg                Time: 1635-1710 OT Time Calculation (min): 35 min Charges:  OT General Charges $OT  Visit: 1 Visit OT Evaluation $OT Eval Moderate Complexity: 1 Mod OT Treatments $Therapeutic Activity: 8-22 mins  05/20/2020  Rich, OTR/L  Acute Rehabilitation Services  Office:  [place] 05/20/2020, 5:34 PM

## 2020-05-20 NOTE — Progress Notes (Signed)
Orthopaedic Trauma Progress Note  S: Doing well.  No major issues.  Pain is been well controlled has not been up with therapy yet.   O:  Vitals:   05/19/20 2340 05/20/20 0354  BP:  (!) 160/78  Pulse:  (!) 104  Resp:  18  Temp:  98.8 F (37.1 C)  SpO2: 97% 99%   No acute distress.  Awake alert and oriented.  Left lower extremity dressing clean dry and intact compartments are soft compressible neurovascular intact.  Unable to tolerate much range of motion of the knee.  Imaging: Stable postoperative imaging  Labs:  Results for orders placed or performed during the hospital encounter of 05/18/20 (from the past 24 hour(s))  I-STAT, chem 8     Status: Abnormal   Collection Time: 05/19/20  9:35 AM  Result Value Ref Range   Sodium 140 135 - 145 mmol/L   Potassium 5.5 (H) 3.5 - 5.1 mmol/L   Chloride 108 98 - 111 mmol/L   BUN 39 (H) 8 - 23 mg/dL   Creatinine, Ser 2.60 (H) 0.61 - 1.24 mg/dL   Glucose, Bld 135 (H) 70 - 99 mg/dL   Calcium, Ion 1.52 (HH) 1.15 - 1.40 mmol/L   TCO2 22 22 - 32 mmol/L   Hemoglobin 11.2 (L) 13.0 - 17.0 g/dL   HCT 33.0 (L) 39 - 52 %   Comment NOTIFIED PHYSICIAN   Comprehensive metabolic panel     Status: Abnormal   Collection Time: 05/20/20  2:44 AM  Result Value Ref Range   Sodium 136 135 - 145 mmol/L   Potassium 5.0 3.5 - 5.1 mmol/L   Chloride 106 98 - 111 mmol/L   CO2 20 (L) 22 - 32 mmol/L   Glucose, Bld 133 (H) 70 - 99 mg/dL   BUN 42 (H) 8 - 23 mg/dL   Creatinine, Ser 2.78 (H) 0.61 - 1.24 mg/dL   Calcium 9.8 8.9 - 10.3 mg/dL   Total Protein 6.2 (L) 6.5 - 8.1 g/dL   Albumin 2.7 (L) 3.5 - 5.0 g/dL   AST 26 15 - 41 U/L   ALT 23 0 - 44 U/L   Alkaline Phosphatase 98 38 - 126 U/L   Total Bilirubin 0.3 0.3 - 1.2 mg/dL   GFR calc non Af Amer 22 (L) >60 mL/min   Anion gap 10 5 - 15  CBC with Differential/Platelet     Status: Abnormal   Collection Time: 05/20/20  2:44 AM  Result Value Ref Range   WBC 12.5 (H) 4.0 - 10.5 K/uL   RBC 3.10 (L) 4.22 - 5.81  MIL/uL   Hemoglobin 10.7 (L) 13.0 - 17.0 g/dL   HCT 34.5 (L) 39 - 52 %   MCV 111.3 (H) 80.0 - 100.0 fL   MCH 34.5 (H) 26.0 - 34.0 pg   MCHC 31.0 30.0 - 36.0 g/dL   RDW 14.0 11.5 - 15.5 %   Platelets 257 150 - 400 K/uL   nRBC 0.0 0.0 - 0.2 %   Neutrophils Relative % 83 %   Neutro Abs 10.4 (H) 1.7 - 7.7 K/uL   Lymphocytes Relative 6 %   Lymphs Abs 0.7 0.7 - 4.0 K/uL   Monocytes Relative 11 %   Monocytes Absolute 1.4 (H) 0 - 1 K/uL   Eosinophils Relative 0 %   Eosinophils Absolute 0.0 0 - 0 K/uL   Basophils Relative 0 %   Basophils Absolute 0.0 0 - 0 K/uL   Immature Granulocytes 0 %  Abs Immature Granulocytes 0.05 0.00 - 0.07 K/uL    Assessment: 69 year old male status post fall  Injuries: Left intra-articular distal femur fracture with femoral shaft extension status post open reduction internal fixation on 05/19/2020  Weightbearing: Touchdown weightbearing left lower extremity, unrestricted range of motion of the knee  Insicional and dressing care: Dressing change tomorrow  Orthopedic device(s): None  CV/Blood loss: Hemoglobin this morning 10.7 from 13.0 consistent with acute blood loss anemia  Pain management: 1.  Gabapentin 300mg  twice daily 2.  Dilaudid 1.5 to 1 mg every 4 hours as needed 3.  Robaxin 500 mg every 6 hours as needed 4.  Oxycodone 5 to 15 mg every 4 hours.  Patient is on buprenorphine at home.  We may need to alter pain medication due to his previous chronic pain history  VTE prophylaxis: Lovenox to start today  ID: [email or web address].  Postoperative surgical prophylaxis.  Foley/Lines: No Foley  Medical co-morbidities: Per hospitalist service  Dispo: PT OT.  Likely will need skilled nursing facility  Follow - up plan: 2 weeks for x-rays and suture removal  Shona Needles, MD [facility] [phone] (office) [email or web address]

## 2020-05-20 NOTE — Evaluation (Signed)
Physical Therapy Evaluation Patient Details Name: Matthew [name] MRN: [id number] DOB: 04/22/1951 Today's Date: 05/20/2020   History of Present Illness  69 year old male known history of asthma HTN, depression, chronic pain on buprenorphine, CKD stage IIILast admitted to the hospital 10/2019 with acute asthma exacerbationPresented from home 05/18/2020 with fall leg gave way, no LOC although hit head x-ray distal left femur showed fracture. S/P L femur ORIF  Clinical Impression  Pt presents with dependencies in mobility secondary to the above diagnosis. Pt was unable to tolerate transfer today secondary to desaturation on 5 liters O2 to 78%. Pt returned to 100% with pursed lip breathing and rest. RN was present and increased the O2 flow rate to 5liters from 2L. Pt continued to drop during attempts to stand or mobility. Pt c/o pain and IV medication was given. Pt presented with L UE twitch. Pt will need increased time for rehab and may possibly need SNF placement depending of progress. Pt will continue to benefit from acute skilled PT to maximize mobility and Independence.    Follow Up Recommendations Follow surgeon's recommendation for DC plan and follow-up therapies    Equipment Recommendations  Rolling walker with 5" wheels    Recommendations for Other Services       Precautions / Restrictions Precautions Precautions: Fall Precaution Comments: desaturation Restrictions LLE Weight Bearing: Touchdown weight bearing      Mobility  Bed Mobility Overal bed mobility: Needs Assistance Bed Mobility: Supine to Sit;Sit to Supine     Supine to sit: Mod assist;HOB elevated Sit to supine: +2 for physical assistance;Mod assist   General bed mobility comments: cues for technique, pt was fatigued from sitting EOB, pt c/o pain and desaturated with attempts to mobilize.  Transfers                    Ambulation/Gait                Stairs            Wheelchair Mobility     Modified Rankin (Stroke Patients Only)       Balance Overall balance assessment: Needs assistance Sitting-balance support: Feet supported;No upper extremity supported Sitting balance-Leahy Scale: Fair                                       Pertinent Vitals/Pain Pain Assessment: 0-10 Pain Score: 6  Pain Descriptors / Indicators: Discomfort;Guarding Pain Intervention(s): Limited activity within patient's tolerance;Monitored during session;Repositioned;RN gave pain meds during session    Sells expects to be discharged to:: Private residence Living Arrangements: Spouse/significant other Available Help at Discharge: Family Type of Home: House Home Access: Stairs to enter Entrance Stairs-Rails: Right;Left;Can reach both Technical brewer of Steps: 5 Home Layout: One level Home Equipment: None      Prior Function Level of Independence: Independent               Hand Dominance        Extremity/Trunk Assessment   Upper Extremity Assessment Upper Extremity Assessment: Defer to OT evaluation    Lower Extremity Assessment Lower Extremity Assessment: Generalized weakness       Communication   Communication: No difficulties  Cognition Arousal/Alertness: Awake/alert Behavior During Therapy: Flat affect Overall Cognitive Status: Within Functional Limits for tasks assessed  General Comments: Pt cooperative, but not feeling well. Pt educated on pursed lip breathing. Pt desaturated with mobility and RN called and assisted pt back to bed.      General Comments General comments (skin integrity, edema, etc.): L UE jerk or twitch at times. Pt reported that is new. Pt c/o R knee pain with attempts to stand. RN assisted with desaturation increasing O2 to 5 liters and replaced o2 sensor to ear for better reading. Pt was not able to tolerate a trasnfer due to fatigue, pain, and desaturation  with effort to mobilize. O2 sats 78% on 5 liters with attempt to stand and bed mobility.    Exercises Total Joint Exercises Ankle Circles/Pumps: AROM;Both;10 reps;Supine Quad Sets: AROM;Strengthening;Left;10 reps;Supine Hip ABduction/ADduction: AAROM;Strengthening;Left;10 reps;Supine Long Arc Quad: AAROM;Strengthening;Left;10 reps;Seated   Assessment/Plan    PT Assessment Patient needs continued PT services  PT Problem List Decreased strength;Decreased balance;Pain;Decreased mobility;Obesity;Decreased activity tolerance;Decreased safety awareness;Decreased range of motion;Decreased knowledge of use of DME       PT Treatment Interventions DME instruction;Functional mobility training;Balance training;Patient/family education;Gait training;Therapeutic activities;Stair training;Therapeutic exercise    PT Goals (Current goals can be found in the Care Plan section)  Acute Rehab PT Goals Patient Stated Goal: To feel better PT Goal Formulation: With patient Time For Goal Achievement: 06/03/20 Potential to Achieve Goals: Good    Frequency Min 5X/week   Barriers to discharge        Co-evaluation               AM-PAC PT "6 Clicks" Mobility  Outcome Measure Help needed turning from your back to your side while in a flat bed without using bedrails?: A Lot Help needed moving from lying on your back to sitting on the side of a flat bed without using bedrails?: A Lot Help needed moving to and from a bed to a chair (including a wheelchair)?: A Lot Help needed standing up from a chair using your arms (e.g., wheelchair or bedside chair)?: A Lot Help needed to walk in hospital room?: A Lot Help needed climbing 3-5 steps with a railing? : Total 6 Click Score: 11    End of Session Equipment Utilized During Treatment: Gait belt Activity Tolerance: Patient limited by fatigue;Patient limited by pain;Treatment limited secondary to medical complications (Comment) (O2 desaturation) Patient  left: in bed;with call bell/phone within reach   PT Visit Diagnosis: Muscle weakness (generalized) (M62.81);Difficulty in walking, not elsewhere classified (R26.2)    Time: 1040-1120 PT Time Calculation (min) (ACUTE ONLY): 40 min   Charges:   PT Evaluation $PT Eval Low Complexity: 1 Low PT Treatments $Therapeutic Exercise: 8-22 mins $Therapeutic Activity: 8-22 mins        Lelon Mast 05/20/2020, 11:25 AM

## 2020-05-20 NOTE — Anesthesia Postprocedure Evaluation (Signed)
Anesthesia Post Note  Patient: Matthew [name]  Procedure(s) Performed: OPEN REDUCTION INTERNAL FIXATION (ORIF) DISTAL FEMUR FRACTURE (Left )     Patient location during evaluation: PACU Anesthesia Type: General Level of consciousness: awake and alert Pain management: pain level controlled Vital Signs Assessment: post-procedure vital signs reviewed and stable Respiratory status: spontaneous breathing, nonlabored ventilation, respiratory function stable and patient connected to nasal cannula oxygen Cardiovascular status: blood pressure returned to baseline and stable Postop Assessment: no apparent nausea or vomiting Anesthetic complications: no   No complications documented.  Last Vitals:  Vitals:   05/20/20 0354 05/20/20 1115  BP: (!) 160/78   Pulse: (!) 104   Resp: 18   Temp: 37.1 C   SpO2: 99% (!) 78%    Last Pain:  Vitals:   05/20/20 1633  TempSrc:   PainSc: 0-No pain                 Catalina Gravel

## 2020-05-21 ENCOUNTER — Inpatient Hospital Stay (HOSPITAL_COMMUNITY): Payer: Medicare HMO

## 2020-05-21 ENCOUNTER — Encounter (HOSPITAL_COMMUNITY): Payer: Self-pay | Admitting: Student

## 2020-05-21 LAB — COMPREHENSIVE METABOLIC PANEL
ALT: 7 U/L (ref 0–44)
AST: 17 U/L (ref 15–41)
Albumin: 2.1 g/dL — ABNORMAL LOW (ref 3.5–5.0)
Alkaline Phosphatase: 74 U/L (ref 38–126)
Anion gap: 7 (ref 5–15)
BUN: 43 mg/dL — ABNORMAL HIGH (ref 8–23)
CO2: 22 mmol/L (ref 22–32)
Calcium: 10.1 mg/dL (ref 8.9–10.3)
Chloride: 108 mmol/L (ref 98–111)
Creatinine, Ser: 2.39 mg/dL — ABNORMAL HIGH (ref 0.61–1.24)
GFR calc non Af Amer: 26 mL/min — ABNORMAL LOW (ref >60)
Glucose, Bld: 120 mg/dL — ABNORMAL HIGH (ref 70–99)
Potassium: 5.1 mmol/L (ref 3.5–5.1)
Sodium: 137 mmol/L (ref 135–145)
Total Bilirubin: 0.6 mg/dL (ref 0.3–1.2)
Total Protein: 5.3 g/dL — ABNORMAL LOW (ref 6.5–8.1)

## 2020-05-21 LAB — CBC WITH DIFFERENTIAL/PLATELET
Abs Immature Granulocytes: 0.04 10*3/uL (ref 0.00–0.07)
Basophils Absolute: 0 10*3/uL (ref 0.0–0.1)
Basophils Relative: 1 %
Eosinophils Absolute: 0.2 10*3/uL (ref 0.0–0.5)
Eosinophils Relative: 3 %
HCT: 28.8 % — ABNORMAL LOW (ref 39.0–52.0)
Hemoglobin: 8.9 g/dL — ABNORMAL LOW (ref 13.0–17.0)
Immature Granulocytes: 1 %
Lymphocytes Relative: 16 %
Lymphs Abs: 1.4 10*3/uL (ref 0.7–4.0)
MCH: 34.4 pg — ABNORMAL HIGH (ref 26.0–34.0)
MCHC: 30.9 g/dL (ref 30.0–36.0)
MCV: 111.2 fL — ABNORMAL HIGH (ref 80.0–100.0)
Monocytes Absolute: 1 10*3/uL (ref 0.1–1.0)
Monocytes Relative: 11 %
Neutro Abs: 6.1 10*3/uL (ref 1.7–7.7)
Neutrophils Relative %: 68 %
Platelets: 229 10*3/uL (ref 150–400)
RBC: 2.59 MIL/uL — ABNORMAL LOW (ref 4.22–5.81)
RDW: 14.1 % (ref 11.5–15.5)
WBC: 8.9 10*3/uL (ref 4.0–10.5)
nRBC: 0 % (ref 0.0–0.2)

## 2020-05-21 MED ORDER — METHOCARBAMOL 500 MG PO TABS: 500.0000 mg | ORAL_TABLET | Freq: Four times a day (QID) | ORAL | 1 refills | Status: DC | PRN

## 2020-05-21 MED ORDER — OXYCODONE HCL 10 MG PO TABS: 10.0000 mg | ORAL_TABLET | ORAL | 0 refills | Status: DC | PRN

## 2020-05-21 MED ORDER — AMLODIPINE BESYLATE 10 MG PO TABS
10.0000 mg | ORAL_TABLET | Freq: Every day | ORAL | Status: DC
  Administered 2020-05-21 – 2020-05-27 (×7): 10 mg via ORAL
  Filled 2020-05-21 (×7): qty 1

## 2020-05-21 MED ORDER — ENOXAPARIN SODIUM 40 MG/0.4ML ~~LOC~~ SOLN: 40.0000 mg | SUBCUTANEOUS | 0 refills | Status: DC

## 2020-05-21 NOTE — Progress Notes (Signed)
Orthopaedic Trauma Progress Note  S: Pain doing better on percocet. No issues currently.  O:  Vitals:   05/21/20 0605 05/21/20 1300  BP: (!) 155/74 (!) 148/68  Pulse: 86 67  Resp:    Temp: (!) 97.5 F (36.4 C) 98.1 F (36.7 C)  SpO2: 98% 95%   No acute distress.  Awake alert and oriented.  Left lower extremity incisions clean dry and intact compartments are soft compressible neurovascular intact.  Unable to tolerate much range of motion of the knee.  Imaging: Stable postoperative imaging  Labs:  Results for orders placed or performed during the hospital encounter of 05/18/20 (from the past 24 hour(s))  Comprehensive metabolic panel     Status: Abnormal   Collection Time: 05/21/20  3:50 AM  Result Value Ref Range   Sodium 137 135 - 145 mmol/L   Potassium 5.1 3.5 - 5.1 mmol/L   Chloride 108 98 - 111 mmol/L   CO2 22 22 - 32 mmol/L   Glucose, Bld 120 (H) 70 - 99 mg/dL   BUN 43 (H) 8 - 23 mg/dL   Creatinine, Ser 2.39 (H) 0.61 - 1.24 mg/dL   Calcium 10.1 8.9 - 10.3 mg/dL   Total Protein 5.3 (L) 6.5 - 8.1 g/dL   Albumin 2.1 (L) 3.5 - 5.0 g/dL   AST 17 15 - 41 U/L   ALT 7 0 - 44 U/L   Alkaline Phosphatase 74 38 - 126 U/L   Total Bilirubin 0.6 0.3 - 1.2 mg/dL   GFR calc non Af Amer 26 (L) >60 mL/min   Anion gap 7 5 - 15  CBC with Differential/Platelet     Status: Abnormal   Collection Time: 05/21/20  3:50 AM  Result Value Ref Range   WBC 8.9 4.0 - 10.5 K/uL   RBC 2.59 (L) 4.22 - 5.81 MIL/uL   Hemoglobin 8.9 (L) 13.0 - 17.0 g/dL   HCT 28.8 (L) 39 - 52 %   MCV 111.2 (H) 80.0 - 100.0 fL   MCH 34.4 (H) 26.0 - 34.0 pg   MCHC 30.9 30.0 - 36.0 g/dL   RDW 14.1 11.5 - 15.5 %   Platelets 229 150 - 400 K/uL   nRBC 0.0 0.0 - 0.2 %   Neutrophils Relative % 68 %   Neutro Abs 6.1 1.7 - 7.7 K/uL   Lymphocytes Relative 16 %   Lymphs Abs 1.4 0.7 - 4.0 K/uL   Monocytes Relative 11 %   Monocytes Absolute 1.0 0.1 - 1.0 K/uL   Eosinophils Relative 3 %   Eosinophils Absolute 0.2 0 - 0  K/uL   Basophils Relative 1 %   Basophils Absolute 0.0 0 - 0 K/uL   RBC Morphology MORPHOLOGY UNREMARKABLE    Immature Granulocytes 1 %   Abs Immature Granulocytes 0.04 0.00 - 0.07 K/uL    Assessment: 69 year old male status post fall  Injuries: Left intra-articular distal femur fracture with femoral shaft extension status post open reduction internal fixation on 05/19/2020  Weightbearing: Touchdown weightbearing left lower extremity, unrestricted range of motion of the knee  Insicional and dressing care: Okay to leave dressing open to air  Orthopedic device(s): None  CV/Blood loss: Hemoglobin this morning 8.9 from 10.7 from 13.0 consistent with acute blood loss anemia  Pain management: 1.  Gabapentin 300mg  twice daily 2.  Dilaudid 1.5 to 1 mg every 4 hours as needed 3.  Robaxin 500 mg every 6 hours as needed 4.  Oxycodone 5 to 15 mg every  4 hours.  VTE prophylaxis: Lovenox to start today  ID: [email or web address].  Postoperative surgical prophylaxis.  Foley/Lines: No Foley  Medical co-morbidities: Per hospitalist service  Dispo: PT OT.  Likely will need skilled nursing facility. Okay to dc from orthopaedic standpoint. Scripts for lovenox and pain meds signed and placed on chart  Follow - up plan: 2 weeks for x-rays and suture removal  Shona Needles, MD [facility] [phone] (office) [email or web address]

## 2020-05-21 NOTE — Progress Notes (Signed)
Physical Therapy Treatment Patient Details Name: Matthew [name] MRN: [id number] DOB: 1950-09-15 Today's Date: 05/21/2020    History of Present Illness 69 year old male known history of asthma HTN, depression, chronic pain on buprenorphine, CKD stage IIILast admitted to the hospital 10/2019 with acute asthma exacerbationPresented from home 05/18/2020 with fall leg gave way, no LOC although hit head x-ray distal left femur showed fracture. S/P L femur ORIF    PT Comments    Pt supine on arrival, agreeable to therapy session with good participation and tolerance for session. Pt remains limited due to increased work of breathing and elevated HR (110-125 bpm) during mobility tasks at bedside, SpO2 desat with bed mobility (to 80%) but improves to >90% within 1-2 minutes of seated rest and pursed-lip breathing techniques. Pt c/o dizziness and SBP increased >20 points with transition from seated to supine. Pt performed bed mobility with increased time to perform but decreased assist needed this session with heavy use of bed features, and performed seated scooting along edge of bed with mostly minA, needing assist for LLE management to maintain precautions and transfer pad assist. Pt continues to benefit from PT services to progress toward functional mobility goals. D/C recs below updated per pt progress after discussion with pt and Supervising PT Wyona Almas.  Follow Up Recommendations  SNF;Supervision/Assistance - 24 hour     Equipment Recommendations  Other (comment);None recommended by PT (defer to next location; if home, RW, sliding board, wc, 3in1)    Recommendations for Other Services       Precautions / Restrictions Precautions Precautions: Fall Precaution Comments: Monitor O2 Restrictions Weight Bearing Restrictions: Yes LLE Weight Bearing: Touchdown weight bearing Other Position/Activity Restrictions: unrestricted range of motion of the knee    Mobility  Bed Mobility Overal bed  mobility: Needs Assistance Bed Mobility: Supine to Sit     Supine to sit: Min assist;HOB elevated (HR assist) Sit to supine: Mod assist;HOB elevated   General bed mobility comments: cues for technique and use of bed features, BLE assist, HOB partially elevated  Transfers Overall transfer level: Needs assistance   Transfers: Lateral/Scoot Transfers          Lateral/Scoot Transfers: Min assist General transfer comment: pt able to scoot to foot of bed then head of bed with +60minA and multiple breaks with cues for pursed-lip breathing. Pt needing minA at times to reposition LLE to maintain TDWB, LLE is heavy and difficult for pt to maneuver     Wheelchair Mobility    Modified Rankin (Stroke Patients Only)       Balance Overall balance assessment: Needs assistance Sitting-balance support: Bilateral upper extremity supported Sitting balance-Leahy Scale: Fair Sitting balance - Comments: pt able to sit unsupported briefly while static sitting in bed, however frequently with BUE support and posterior lean 2/2 LLE pain and shortness of breath Postural control: Posterior lean           Cognition Arousal/Alertness: Awake/alert Behavior During Therapy: WFL for tasks assessed/performed Overall Cognitive Status: Within Functional Limits for tasks assessed          General Comments: pt pleasantly cooperative, SOB and pain limited      Exercises Total Joint Exercises Ankle Circles/Pumps: AROM;Both;10 reps;Supine Quad Sets: AROM;5 reps;Supine;Both General Exercises - Lower Extremity Gluteal Sets: AROM;Supine;Both;5 reps    General Comments        Pertinent Vitals/Pain Pain Assessment: Faces Faces Pain Scale: Hurts whole lot Pain Location: LLE Pain Descriptors / Indicators: Discomfort;Guarding;Grimacing Pain Intervention(s): Monitored during session;Repositioned;Ice  applied           PT Goals (current goals can now be found in the care plan section) Acute Rehab PT  Goals Patient Stated Goal: I'd like to learn how I'm going to move. PT Goal Formulation: With patient Time For Goal Achievement: 06/03/20 Potential to Achieve Goals: Good Progress towards PT goals: Progressing toward goals    Frequency    Min 5X/week      PT Plan Discharge plan needs to be updated    AM-PAC PT "6 Clicks" Mobility   Outcome Measure  Help needed turning from your back to your side while in a flat bed without using bedrails?: A Little Help needed moving from lying on your back to sitting on the side of a flat bed without using bedrails?: A Little Help needed moving to and from a bed to a chair (including a wheelchair)?: A Lot Help needed standing up from a chair using your arms (e.g., wheelchair or bedside chair)?: Total Help needed to walk in hospital room?: Total Help needed climbing 3-5 steps with a railing? : Total 6 Click Score: 11    End of Session   Activity Tolerance: Patient tolerated treatment well;Patient limited by pain;Other (comment) (dyspnea on exertion) Patient left: in bed;with call bell/phone within reach;with bed alarm set (folded blanket under L ankle to offload heel) Nurse Communication: Mobility status;Weight bearing status;Precautions PT Visit Diagnosis: Muscle weakness (generalized) (M62.81);Difficulty in walking, not elsewhere classified (R26.2)     Time: 1410-1455 PT Time Calculation (min) (ACUTE ONLY): 45 min  Charges:  $Therapeutic Exercise: 8-22 mins $Therapeutic Activity: 23-37 mins                     Teigen Parslow P., PTA Acute Rehabilitation Services Pager: [phone] Office: [place] 05/21/2020, 3:27 PM

## 2020-05-21 NOTE — [place] FL2 (Signed)
[place] MEDICAID FL2 LEVEL OF CARE SCREENING TOOL     IDENTIFICATION  Patient Name: Matthew [name] Birthdate: [birth year] Sex: male Admission Date (Current Location): 05/18/2020  [facility] [place] Number:  Herbalist and Address:  [facility], [place] [address], [place], [place] [address]      Provider Number: [id number]  Attending Physician Name and Address:  Nita Sells, MD  Relative Name and Phone Number:       Current Level of Care: Hospital Recommended Level of Care: [place] Prior Approval Number:    Date Approved/Denied:   PASRR Number: [id number]  Discharge Plan: SNF    Current Diagnoses: Patient Active Problem List   Diagnosis Date Noted  . Displaced comminuted fracture of shaft of left femur, initial encounter for closed fracture ([place]) 05/19/2020  . Asthma 05/18/2020  . Essential hypertension 05/18/2020  . Femur fracture, left ([place]) 05/18/2020  . Closed comminuted intra-articular fracture of distal femur ([place]) 05/17/2020  . Acute asthma exacerbation 10/15/2018    Orientation RESPIRATION BLADDER Height & Weight     Self, Time, Situation, Place  Normal Continent Weight: 295 lb (133.8 kg) Height:  6\' 2"  (188 cm)  BEHAVIORAL SYMPTOMS/MOOD NEUROLOGICAL BOWEL NUTRITION STATUS      Continent Diet (See discharge summary)  AMBULATORY STATUS COMMUNICATION OF NEEDS Skin   Limited Assist Verbally Surgical wounds (Left leg)                       Personal Care Assistance Level of Assistance  Bathing, Feeding, Dressing Bathing Assistance: Limited assistance Feeding assistance: Independent Dressing Assistance: Limited assistance     Functional Limitations Info  Speech, Hearing, Sight Sight Info: Adequate Hearing Info: Adequate Speech Info: Adequate    SPECIAL CARE FACTORS FREQUENCY  PT (By licensed PT), OT (By licensed OT)     PT Frequency: 5x a week OT Frequency: 5x a week             Contractures Contractures Info: Not present    Additional Factors Info  Code Status, Allergies Code Status Info: full Allergies Info: Demerol           Current Medications (05/21/2020):  This is the current hospital active medication list Current Facility-Administered Medications  Medication Dose Route Frequency Provider Last Rate Last Admin  . 0.9 %  sodium chloride infusion   Intravenous Continuous Nita Sells, MD 75 mL/hr at 05/21/20 1317 Rate Change at 05/21/20 1317  . albuterol (PROVENTIL) (2.5 MG/3ML) 0.083% nebulizer solution 2.5 mg  2.5 mg Nebulization Q2H PRN Delray Alt, PA-C   2.5 mg at 05/20/20 2143  . docusate sodium (COLACE) capsule 100 mg  100 mg Oral BID Patrecia Pace A, PA-C   100 mg at 05/21/20 2595  . enoxaparin (LOVENOX) injection 40 mg  40 mg Subcutaneous Q24H Patrecia Pace A, PA-C   40 mg at 05/21/20 0856  . gabapentin (NEURONTIN) capsule 300 mg  300 mg Oral BID Patrecia Pace A, PA-C   300 mg at 05/21/20 0957  . hydrALAZINE (APRESOLINE) injection 10 mg  10 mg Intravenous Q4H PRN Delray Alt, PA-C   10 mg at 05/18/20 0754  . HYDROmorphone (DILAUDID) injection 0.5-1 mg  0.5-1 mg Intravenous Q4H PRN Patrecia Pace A, PA-C   0.5 mg at 05/20/20 1050  . methocarbamol (ROBAXIN) tablet 500 mg  500 mg Oral Q6H PRN Delray Alt, PA-C   500 mg at 05/21/20 6387  Or  . methocarbamol (ROBAXIN) 500 mg in dextrose 5 % 50 mL IVPB  500 mg Intravenous Q6H PRN Delray Alt, PA-C      . metoCLOPramide (REGLAN) tablet 5-10 mg  5-10 mg Oral Q8H PRN Patrecia Pace A, PA-C       Or  . metoCLOPramide (REGLAN) injection 5-10 mg  5-10 mg Intravenous Q8H PRN Delray Alt, PA-C      . multivitamin with minerals tablet 1 tablet  1 tablet Oral Daily Delray Alt, PA-C   1 tablet at 05/21/20 0957  . ondansetron (ZOFRAN) tablet 4 mg  4 mg Oral Q6H PRN Patrecia Pace A, PA-C       Or  . ondansetron [facility]) injection 4 mg  4 mg Intravenous Q6H PRN Patrecia Pace A,  PA-C      . oxyCODONE (Oxy IR/ROXICODONE) immediate release tablet 10-15 mg  10-15 mg Oral Q4H PRN Patrecia Pace A, PA-C   15 mg at 05/21/20 0509  . oxyCODONE (Oxy IR/ROXICODONE) immediate release tablet 5-10 mg  5-10 mg Oral Q4H PRN Delray Alt, PA-C   10 mg at 05/19/20 1555  . polyethylene glycol (MIRALAX / GLYCOLAX) packet 17 g  17 g Oral Daily PRN Patrecia Pace A, PA-C      . protein supplement (ENSURE MAX) liquid  11 oz Oral QHS Patrecia Pace A, PA-C   237 mL at 05/20/20 2147  . sodium chloride flush (NS) 0.9 % injection 10-40 mL  10-40 mL Intracatheter Q12H Nita Sells, MD   10 mL at 05/21/20 1048  . sodium chloride flush (NS) 0.9 % injection 10-40 mL  10-40 mL Intracatheter PRN Nita Sells, MD         Discharge Medications: Please see discharge summary for a list of discharge medications.  Relevant Imaging Results:  Relevant Lab Results:   Additional Information SSN [id number]  Emeterio Reeve, [place]

## 2020-05-21 NOTE — Discharge Instructions (Signed)
Orthopaedic Trauma Service Discharge Instructions   General Discharge Instructions  Orthopaedic Injuries:  Left femur fracture s/p ORIF  WEIGHT BEARING STATUS:Touchdown weightbearing to left leg  RANGE OF MOTION/ACTIVITY:Unrestricted range of motion  Wound Care:Okay to leave incisions open to air. Otherwise follow instructions below  DVT/PE prophylaxis:Lovenox 40 mg daily  Diet: as you were eating previously.  Can use over the counter stool softeners and bowel preparations, such as Miralax, to help with bowel movements.  Narcotics can be constipating.  Be sure to drink plenty of fluids  PAIN MEDICATION USE AND EXPECTATIONS  You have likely been given narcotic medications to help control your pain.  After a traumatic event that results in an fracture (broken bone) with or without surgery, it is ok to use narcotic pain medications to help control one's pain.  We understand that everyone responds to pain differently and each individual patient will be evaluated on a regular basis for the continued need for narcotic medications. Ideally, narcotic medication use should last no more than 6-8 weeks (coinciding with fracture healing).   As a patient it is your responsibility as well to monitor narcotic medication use and report the amount and frequency you use these medications when you come to your office visit.   We would also advise that if you are using narcotic medications, you should take a dose prior to therapy to maximize you participation.  IF YOU ARE ON NARCOTIC MEDICATIONS IT IS NOT PERMISSIBLE TO OPERATE A MOTOR VEHICLE (MOTORCYCLE/CAR/TRUCK/MOPED) OR HEAVY MACHINERY DO NOT MIX NARCOTICS WITH OTHER CNS (CENTRAL NERVOUS SYSTEM) DEPRESSANTS SUCH AS ALCOHOL   STOP SMOKING OR USING NICOTINE PRODUCTS!!!!  As discussed nicotine severely impairs your body's ability to heal surgical and traumatic wounds but also impairs bone healing.  Wounds and bone heal by forming microscopic blood  vessels (angiogenesis) and nicotine is a vasoconstrictor (essentially, shrinks blood vessels).  Therefore, if vasoconstriction occurs to these microscopic blood vessels they essentially disappear and are unable to deliver necessary nutrients to the healing tissue.  This is one modifiable factor that you can do to dramatically increase your chances of healing your injury.    (This means no smoking, no nicotine gum, patches, etc)  DO NOT USE NONSTEROIDAL ANTI-INFLAMMATORY DRUGS (NSAID'S)  Using products such as Advil (ibuprofen), Aleve (naproxen), Motrin (ibuprofen) for additional pain control during fracture healing can delay and/or prevent the healing response.  If you would like to take over the counter (OTC) medication, Tylenol (acetaminophen) is ok.  However, some narcotic medications that are given for pain control contain acetaminophen as well. Therefore, you should not exceed more than 4000 mg of tylenol in a day if you do not have liver disease.  Also note that there are may OTC medicines, such as cold medicines and allergy medicines that my contain tylenol as well.  If you have any questions about medications and/or interactions please ask your doctor/PA or your pharmacist.      ICE AND ELEVATE INJURED/OPERATIVE EXTREMITY  Using ice and elevating the injured extremity above your heart can help with swelling and pain control.  Icing in a pulsatile fashion, such as 20 minutes on and 20 minutes off, can be followed.    Do not place ice directly on skin. Make sure there is a barrier between to skin and the ice pack.    Using frozen items such as frozen peas works well as the conform nicely to the are that needs to be iced.  USE AN ACE  WRAP OR TED HOSE FOR SWELLING CONTROL  In addition to icing and elevation, Ace wraps or TED hose are used to help limit and resolve swelling.  It is recommended to use Ace wraps or TED hose until you are informed to stop.    When using Ace Wraps start the wrapping  distally (farthest away from the body) and wrap proximally (closer to the body)   Example: If you had surgery on your leg or thing and you do not have a splint on, start the ace wrap at the toes and work your way up to the thigh        If you had surgery on your upper extremity and do not have a splint on, start the ace wrap at your fingers and work your way up to the upper arm  IF YOU ARE IN A SPLINT OR CAST DO NOT [place]   If your splint gets wet for any reason please contact the office immediately. You may shower in your splint or cast as long as you keep it dry.  This can be done by wrapping in a cast cover or garbage back (or similar)  Do Not stick any thing down your splint or cast such as pencils, money, or hangers to try and scratch yourself with.  If you feel itchy take benadryl as prescribed on the bottle for itching  IF YOU ARE IN A CAM BOOT (BLACK BOOT)  You may remove boot periodically. Perform daily dressing changes as noted below.  Wash the liner of the boot regularly and wear a sock when wearing the boot. It is recommended that you sleep in the boot until told otherwise    Call office for the following:  Temperature greater than 101F  Persistent nausea and vomiting  Severe uncontrolled pain  Redness, tenderness, or signs of infection (pain, swelling, redness, odor or green/yellow discharge around the site)  Difficulty breathing, headache or visual disturbances  Hives  Persistent dizziness or light-headedness  Extreme fatigue  Any other questions or concerns you may have after discharge  In an emergency, call 911 or go to an Emergency Department at a nearby hospital  HELPFUL INFORMATION  ? If you had a block, it will wear off between 8-24 hrs postop typically.  This is period when your pain may go from nearly zero to the pain you would have had postop without the block.  This is an abrupt transition but nothing dangerous is happening.  You may  take an extra dose of narcotic when this happens.  ? You should wean off your narcotic medicines as soon as you are able.  Most patients will be off or using minimal narcotics before their first postop appointment.   ? We suggest you use the pain medication the first night prior to going to bed, in order to ease any pain when the anesthesia wears off. You should avoid taking pain medications on an empty stomach as it will make you nauseous.  ? Do not drink alcoholic beverages or take illicit drugs when taking pain medications.  ? In most states it is against the law to drive while you are in a splint or sling.  And certainly against the law to drive while taking narcotics.  ? You may return to work/school in the next couple of days when you feel up to it.   ? Pain medication may make you constipated.  Below are a few solutions to try in this order: -  Decrease the amount of pain medication if you aren't having pain. - Drink lots of decaffeinated fluids. - Drink prune juice and/or each dried prunes  o If the first 3 don't work start with additional solutions - Take Colace - an over-the-counter stool softener - Take Senokot - an over-the-counter laxative - Take Miralax - a stronger over-the-counter laxative     CALL THE OFFICE WITH ANY QUESTIONS OR CONCERNS: [phone]   VISIT OUR WEBSITE FOR ADDITIONAL INFORMATION: [email or web address]    Discharge Wound Care Instructions  Do NOT apply any ointments, solutions or lotions to pin sites or surgical wounds.  These prevent needed drainage and even though solutions like hydrogen peroxide kill bacteria, they also damage cells lining the pin sites that help fight infection.  Applying lotions or ointments can keep the wounds moist and can cause them to breakdown and open up as well. This can increase the risk for infection. When in doubt call the office.  Surgical incisions should be dressed daily.  If any drainage is noted, use one layer of  adaptic, then gauze, Kerlix, and an ace wrap.  Once the incision is completely dry and without drainage, it may be left open to air out.  Showering may begin 36-48 hours later.  Cleaning gently with soap and water.  Traumatic wounds should be dressed daily as well.    One layer of adaptic, gauze, Kerlix, then ace wrap.  The adaptic can be discontinued once the draining has ceased    If you have a wet to dry dressing: wet the gauze with saline the squeeze as much saline out so the gauze is moist (not soaking wet), place moistened gauze over wound, then place a dry gauze over the moist one, followed by Kerlix wrap, then ace wrap.

## 2020-05-21 NOTE — Progress Notes (Signed)
PROGRESS NOTE    Matthew [name]  TGG:[id number] DOB: 06/07/1951 DOA: 05/18/2020 PCP: Tracie Harrier, MD  Brief Narrative:  69 year old male known history of asthma HTN, depression, chronic pain on buprenorphine, CKD stage III Last admitted to the hospital 10/2019 with acute asthma exacerbation Presented from home 05/18/2020 with fall leg gave way, no LOC although hit head x-ray distal left femur showed fracture   Assessment & Plan:   Principal Problem:   Closed comminuted intra-articular fracture of distal femur ([place]) Active Problems:   Asthma   Essential hypertension   Femur fracture, left ([facility])   Displaced comminuted fracture of shaft of left femur, initial encounter for closed fracture ([place])   1. Left femoral fracture status post ORIF left supracondylar distal femur fracture + left femoral shaft fracture 10/6 a. Pain control OxyIR 5-10 every 4 as needed, for severe 10-15 every 4 as needed with Dilaudid superimposed for severe pain uncontrolled b. Patient is touchdown weightbearing lower extremity but unrestricted ROM to knee c. Will need to skilled care 2. Acute superimposed on chronic CKD stage III' a. Toradol discontinued 10/7 b. Fluid rate readjusted back to 75 cc an hour after temporary increase on 10/7 c. Careful I/O monitoring d. Creatinine is slightly improved today but not back to baseline so not ready for discharge e. bladder scan did not show any retention of urine 3. 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 Desats down to 88-89 off oxygen b. CXR my over read 10/8 does not show any focal consolidation predominantly atelectasis c. Encourage incentive spirometer and flutter valve 4. Probable OSA given chronic pain on buprenorphine a. Usually on butrans which has been held b. Resume tizanidine c. Once acute pain component is improved after several weeks at facility will probably be able to resume the Butrans 5. Depression a. Resume Cymblata 60 bid 6. HTN a. 2/2 azotemia,  held ACE lisinopril 10 and will need alternative regimen b. Adding amlodipine 10 today  DVT prophylaxis: loveneox Code Status: Full Family Communication: none + Disposition:   Status is: Inpatient  Remains inpatient appropriate because:Persistent severe electrolyte disturbances and IV treatments appropriate due to intensity of illness or inability to take PO   Dispo: The patient is from: Home              Anticipated d/c is to: SNF              Anticipated d/c date is: 2 days              Patient currently is not medically stable to d/c.       Consultants:   ortho  Procedures: nad  Antimicrobials: na    Subjective: 6-7/10 pain No fever Not using incentive spirometer but willing to trial it No sputum Bladder scan did not show anything Still desaturates at times  Objective: Vitals:   05/20/20 1115 05/20/20 1941 05/20/20 2358 05/21/20 0605  BP:  (!) 141/84 121/81 (!) 155/74  Pulse:  71 100 86  Resp:  18 17   Temp:  (!) 97.5 F (36.4 C) 97.6 F (36.4 C) (!) 97.5 F (36.4 C)  TempSrc:  Oral Oral Oral  SpO2: (!) 78% 95% 95% 98%  Weight:      Height:        Intake/Output Summary (Last 24 hours) at 05/21/2020 1044 Last data filed at 05/21/2020 1000 Gross per 24 hour  Intake 967.61 ml  Output 1750 ml  Net -782.39 ml   Filed Weights   05/19/20  0828  Weight: 133.8 kg    Examination:  General exam: eomi ncat obese pleasant in and no focal deficit Respiratory system: cta b no added sounds no rales no rhonchi Cardiovascular system: s1 s2 no m/r/g Gastrointestinal system: abd soft nt dn no rebound no gaurd. Extremities: nad--hass knee immobilizer LLE   Data Reviewed: I have personally reviewed following labs and imaging studies Potassium 6.0-->5.5-->5.0-->5.1 Bun/creat 30/2.3--->39/2.6-->42/2.7-->43/2.3 Salvato count 11.1-->12.5-->8.9 Hemoglobin 12.8-->11.2-->10.7  Radiology Studies: DG Chest 2 View  Result Date: 05/21/2020 CLINICAL DATA:  Shortness  of breath EXAM: CHEST - 2 VIEW COMPARISON:  Chest radiograph May 17, 2020 FINDINGS: There is atelectatic change in the left lower lobe. The lungs elsewhere are clear. Heart is upper normal in size with pulmonary vascularity normal. No adenopathy. There is degenerative change in the thoracic spine. IMPRESSION: Left base atelectasis. Lungs elsewhere clear. Heart upper normal in size. Electronically Signed   By: Lowella Grip III M.D.   On: 05/21/2020 08:07   DG Knee Left Port  Result Date: 05/19/2020 CLINICAL DATA:  Postop distal femur fracture. EXAM: PORTABLE LEFT KNEE - 1-2 VIEW COMPARISON:  Preoperative imaging. FINDINGS: Lateral plate and multi screw fixation traverses comminuted distal femur fracture. Fracture is in improved alignment compared to preoperative imaging. Left hip arthroplasty is partially included. Recent postsurgical change includes air and edema in the soft tissues. IMPRESSION: ORIF of comminuted distal femur fracture, in improved alignment compared to preoperative imaging. Electronically Signed   By: Keith Rake M.D.   On: 05/19/2020 16:54   DG C-Arm 1-60 Min  Result Date: 05/19/2020 CLINICAL DATA:  Left femur ORIF EXAM: LEFT FEMUR 2 VIEWS; DG C-ARM 1-60 MIN COMPARISON:  05/18/2020 FINDINGS: 13 C-arm fluoroscopic images were obtained intraoperatively and submitted for post operative interpretation. Findings demonstrate placement of lateral sideplate and screw fixation construct traversing distal left femoral metadiaphyseal fracture. Alignment is improved compared to preoperative imaging. 2 minutes and 55 seconds of fluoroscopy time was utilized. Please see the performing provider's procedural report for further detail. IMPRESSION: As above. Electronically Signed   By: Davina Poke D.O.   On: 05/19/2020 13:16   DG FEMUR MIN 2 VIEWS LEFT  Result Date: 05/19/2020 CLINICAL DATA:  Left femur ORIF EXAM: LEFT FEMUR 2 VIEWS; DG C-ARM 1-60 MIN COMPARISON:  05/18/2020 FINDINGS:  13 C-arm fluoroscopic images were obtained intraoperatively and submitted for post operative interpretation. Findings demonstrate placement of lateral sideplate and screw fixation construct traversing distal left femoral metadiaphyseal fracture. Alignment is improved compared to preoperative imaging. 2 minutes and 55 seconds of fluoroscopy time was utilized. Please see the performing provider's procedural report for further detail. IMPRESSION: As above. Electronically Signed   By: Davina Poke D.O.   On: 05/19/2020 13:16     Scheduled Meds: . docusate sodium  100 mg Oral BID  . enoxaparin (LOVENOX) injection  40 mg Subcutaneous Q24H  . gabapentin  300 mg Oral BID  . multivitamin with minerals  1 tablet Oral Daily  . Ensure Max Protein  11 oz Oral QHS  . sodium chloride flush  10-40 mL Intracatheter Q12H   Continuous Infusions: . sodium chloride 125 mL/hr at 05/20/20 1052  . methocarbamol (ROBAXIN) IV       LOS: 3 days    Time spent: [place], MD [facility] To contact the attending provider between 7A-7P or the covering provider during after hours 7P-7A, please log into the web site [email or web address] and access using universal [place] password for that web  site. If you do not have the password, please call the hospital operator.  05/21/2020, 10:44 AM

## 2020-05-21 NOTE — Plan of Care (Signed)

## 2020-05-22 ENCOUNTER — Inpatient Hospital Stay (HOSPITAL_COMMUNITY): Payer: Medicare HMO

## 2020-05-22 LAB — CBC WITH DIFFERENTIAL/PLATELET
Abs Immature Granulocytes: 0.04 10*3/uL (ref 0.00–0.07)
Basophils Absolute: 0 10*3/uL (ref 0.0–0.1)
Basophils Relative: 0 %
Eosinophils Absolute: 0.5 10*3/uL (ref 0.0–0.5)
Eosinophils Relative: 6 %
HCT: 29.1 % — ABNORMAL LOW (ref 39.0–52.0)
Hemoglobin: 9.2 g/dL — ABNORMAL LOW (ref 13.0–17.0)
Immature Granulocytes: 1 %
Lymphocytes Relative: 18 %
Lymphs Abs: 1.4 10*3/uL (ref 0.7–4.0)
MCH: 35.5 pg — ABNORMAL HIGH (ref 26.0–34.0)
MCHC: 31.6 g/dL (ref 30.0–36.0)
MCV: 112.4 fL — ABNORMAL HIGH (ref 80.0–100.0)
Monocytes Absolute: 1 10*3/uL (ref 0.1–1.0)
Monocytes Relative: 13 %
Neutro Abs: 5 10*3/uL (ref 1.7–7.7)
Neutrophils Relative %: 62 %
Platelets: 244 10*3/uL (ref 150–400)
RBC: 2.59 MIL/uL — ABNORMAL LOW (ref 4.22–5.81)
RDW: 14 % (ref 11.5–15.5)
WBC: 7.9 10*3/uL (ref 4.0–10.5)
nRBC: 0 % (ref 0.0–0.2)

## 2020-05-22 LAB — COMPREHENSIVE METABOLIC PANEL
ALT: 6 U/L (ref 0–44)
AST: 17 U/L (ref 15–41)
Albumin: 2.3 g/dL — ABNORMAL LOW (ref 3.5–5.0)
Alkaline Phosphatase: 78 U/L (ref 38–126)
Anion gap: 6 (ref 5–15)
BUN: 45 mg/dL — ABNORMAL HIGH (ref 8–23)
CO2: 23 mmol/L (ref 22–32)
Calcium: 10.4 mg/dL — ABNORMAL HIGH (ref 8.9–10.3)
Chloride: 109 mmol/L (ref 98–111)
Creatinine, Ser: 2.23 mg/dL — ABNORMAL HIGH (ref 0.61–1.24)
GFR, Estimated: 29 mL/min — ABNORMAL LOW (ref >60)
Glucose, Bld: 117 mg/dL — ABNORMAL HIGH (ref 70–99)
Potassium: 5.2 mmol/L — ABNORMAL HIGH (ref 3.5–5.1)
Sodium: 138 mmol/L (ref 135–145)
Total Bilirubin: 0.5 mg/dL (ref 0.3–1.2)
Total Protein: 5.7 g/dL — ABNORMAL LOW (ref 6.5–8.1)

## 2020-05-22 LAB — TYPE AND SCREEN
ABO/RH(D): O POS
Antibody Screen: POSITIVE
Donor AG Type: NEGATIVE
Donor AG Type: NEGATIVE
Unit division: 0
Unit division: 0

## 2020-05-22 LAB — BLOOD GAS, ARTERIAL
Acid-base deficit: 3.3 mmol/L — ABNORMAL HIGH (ref 0.0–2.0)
Bicarbonate: 21.8 mmol/L (ref 20.0–28.0)
Drawn by: 252031
FIO2: 40
O2 Saturation: 92.4 %
Patient temperature: 37
pCO2 arterial: 43.8 mmHg (ref 32.0–48.0)
pH, Arterial: 7.318 — ABNORMAL LOW (ref 7.350–7.450)
pO2, Arterial: 64.5 mmHg — ABNORMAL LOW (ref 83.0–108.0)

## 2020-05-22 LAB — BPAM RBC
Blood Product Expiration Date: 202111062359
Blood Product Expiration Date: 202111062359
Unit Type and Rh: 5100
Unit Type and Rh: 5100

## 2020-05-22 MED ORDER — OXYCODONE HCL 5 MG PO TABS
10.0000 mg | ORAL_TABLET | ORAL | Status: DC | PRN
  Administered 2020-05-23 – 2020-05-27 (×9): 10 mg via ORAL
  Filled 2020-05-22 (×9): qty 2

## 2020-05-22 MED ORDER — OXYCODONE HCL 5 MG PO TABS
5.0000 mg | ORAL_TABLET | ORAL | Status: DC | PRN
  Administered 2020-05-22: 5 mg via ORAL
  Filled 2020-05-22: qty 1

## 2020-05-22 MED ORDER — NALOXONE HCL 0.4 MG/ML IJ SOLN
0.4000 mg | INTRAMUSCULAR | Status: DC | PRN
  Filled 2020-05-22: qty 1

## 2020-05-22 NOTE — Progress Notes (Signed)
Noted to be restless and disorientated- lost his oxygen- got himself on the side of bed between the foot of bed and the side rail- SOB- labored- audible wheeze- he was able to work himself back up in bed. Administered a prn nebulizer treatment- his breathing improved- maintaining sats >92%. 10 minutes later he had himself back to side of bed between rail and foot- reinforced he is better staying up in the bed. Its taxing him even more to keep moving up and down the bed. He's reporting having hard time breathing- notified RT to evaluate. Will also notify on call phys.

## 2020-05-22 NOTE — Progress Notes (Signed)
PROGRESS NOTE    Matthew [name]  YQM:[id number] DOB: 08-Oct-1950 DOA: 05/18/2020 PCP: Tracie Harrier, MD  Brief Narrative:  69 year old male known history of asthma HTN, depression, chronic pain on buprenorphine, CKD stage III Last admitted to the hospital 10/2019 with acute asthma exacerbation Presented from home 05/18/2020 with fall leg gave way, no LOC although hit head x-ray distal left femur showed fracture Underwent eventual hip repair by Dr. Doreatha Martin on 10/6 Hospitalization complicated by mild encephalopathy which resolved quickly   Assessment & Plan:   Principal Problem:   Closed comminuted intra-articular fracture of distal femur ([place]) Active Problems:   Asthma   Essential hypertension   Femur fracture, left ([place])   Displaced comminuted fracture of shaft of left femur, initial encounter for closed fracture ([place])   1. Mild encephalopathy morning of 10/8 a. Probably related to some mild hypoxia in the setting of known use of CPAP although he has a habitus he has no diagnosis b. This resolved relatively quickly c. See below 2. Left femoral fracture status post ORIF left supracondylar distal femur fracture + left femoral shaft fracture 10/6 a. Pain control OxyIR 5 every 4 as needed moderate pain, 10 mg every 4 as needed severe pain no IV because of metabolic encephalopathy 52/8 b. TTWB unrestricted ROM to knee-likely need to skilled care 3. Acute superimposed on chronic CKD stage III baseline 1.8 a. Toradol discontinued 10/7 b. Fluid rate readjusted back to 75 cc an hour after temporary increase on 10/7 c. Bladder scan no retention, creatinine trending closer to normal range, baseline is below 2 4. 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 Desats down to 88-89 off oxygen b. CXR my over read 10/8 does not show any focal consolidation predominantly atelectasis c. Encourage incentive spirometer and flutter valve 5. Probable OSA given chronic pain on buprenorphine a. Usually on butrans  which has been held b. Resume tizanidine c. Once acute pain component is improved after several weeks at facility will probably be able to resume the Butrans 6. Depression a. Resume Cymblata 60 bid 7. HTN a. 2/2 azotemia, held ACE lisinopril 10 and will need alternative regimen b. Adding amlodipine 10 today  DVT prophylaxis: loveneox Code Status: Full Family Communication: Called wife phone [phone] but no answer so we will try to update again Disposition:   Status is: Inpatient  Remains inpatient appropriate because:Persistent severe electrolyte disturbances and IV treatments appropriate due to intensity of illness or inability to take PO   Dispo: The patient is from: Home              Anticipated d/c is to: SNF              Anticipated d/c date is: 2 days              Patient currently is not medically stable to d/c.  Consultants:   ortho  Procedures: nad  Antimicrobials: na    Subjective:  Called earlier this morning because of encephalopathy and apparent confusion however when I went to the bedside he knew where he was could tell me that sometimes when he awakens he is confused He is quite coherent Blood gas done this morning showed hypoxia chest x-ray to my read showed Right lung fields however no overt pneumonia    Objective: Vitals:   05/21/20 2034 05/22/20 0422 05/22/20 0619 05/22/20 0806  BP: (!) 142/67 (!) 151/72 133/73 108/75  Pulse: 90 100 (!) 102 92  Resp: 18 17 20 16   Temp:  97.8 F (36.6 C) 98.2 F (36.8 C)  98.1 F (36.7 C)  TempSrc: Oral Oral  Oral  SpO2: 98% 98% 92% 94%  Weight:      Height:        Intake/Output Summary (Last 24 hours) at 05/22/2020 1345 Last data filed at 05/22/2020 0857 Gross per 24 hour  Intake 720 ml  Output 1000 ml  Net -280 ml   Filed Weights   05/19/20 0828  Weight: 133.8 kg    Examination:  General exam: eomi ncat obese pleasant in and no focal deficit neck soft supple he is coherent Respiratory system: cta  b no added sounds no rales no rhonchi no wheeze Cardiovascular system: s1 s2 no m/r/g Gastrointestinal system: abd soft nt dn no rebound no gaurd. Extremities: nad--hass knee immobilizer LLE   Data Reviewed: I have personally reviewed following labs and imaging studies Potassium 6.0-->5.5-->5.0-->5.2 Bun/creat 30/2.3--->39/2.6-->42/2.7-->43/2.3-->45/2.2 Kraszewski count 11.1-->12.5-->8.9-->7.9 Hemoglobin 12.8-->11.2-->10.7-->9.2  Radiology Studies: DG Chest 2 View  Result Date: 05/21/2020 CLINICAL DATA:  Shortness of breath EXAM: CHEST - 2 VIEW COMPARISON:  Chest radiograph May 17, 2020 FINDINGS: There is atelectatic change in the left lower lobe. The lungs elsewhere are clear. Heart is upper normal in size with pulmonary vascularity normal. No adenopathy. There is degenerative change in the thoracic spine. IMPRESSION: Left base atelectasis. Lungs elsewhere clear. Heart upper normal in size. Electronically Signed   By: Lowella Grip III M.D.   On: 05/21/2020 08:07   DG Chest Port 1 View  Result Date: 05/22/2020 CLINICAL DATA:  Acute respiratory distress.  History of asthma. EXAM: PORTABLE CHEST 1 VIEW COMPARISON:  May 21, 2020 FINDINGS: The heart size and mediastinal contours are stable. Both lungs are clear. The visualized skeletal structures are unremarkable. IMPRESSION: No active disease. Electronically Signed   By: Abelardo Diesel M.D.   On: 05/22/2020 08:39     Scheduled Meds: . amLODipine  10 mg Oral Daily  . docusate sodium  100 mg Oral BID  . enoxaparin (LOVENOX) injection  40 mg Subcutaneous Q24H  . gabapentin  300 mg Oral BID  . multivitamin with minerals  1 tablet Oral Daily  . Ensure Max Protein  11 oz Oral QHS  . sodium chloride flush  10-40 mL Intracatheter Q12H   Continuous Infusions: . methocarbamol (ROBAXIN) IV       LOS: 4 days    Time spent: [place], MD [facility] To contact the attending provider between 7A-7P or the covering  provider during after hours 7P-7A, please log into the web site [email or web address] and access using universal [place] password for that web site. If you do not have the password, please call the hospital operator.  05/22/2020, 1:45 PM

## 2020-05-22 NOTE — Plan of Care (Signed)

## 2020-05-22 NOTE — Significant Event (Signed)
Rapid Response Event Note   Reason for Call :  Increased confusion, increased oxygen demand, accessory muscle use.   Initial Focused Assessment:  Pt laying in bed with eyes open. He is alert x 1. He denies CP/SOB.He is tachypneic with mild accessory use.  Lungs with scattered crackles t/o. Skin warm and dry. HR-102, BP-133/73, RR-20, SpO2-93% on 5L [place].     Interventions:  Albuterol given PTA RRT.  ABG PCXR IVF placed on hold until PCXR result back Plan of Care:  Await ABG/PCXR results and relay to day shift team. Continue to monitor pt. Call RRT if further assistance needed.    Event Summary:   MD Notified: RN to notify day shift team once ABG/PCXR results back Call [place] Time:0700  Dillard Essex, RN

## 2020-05-22 NOTE — TOC Initial Note (Signed)
Transition of Care [facility]) - Initial/Assessment Note    Patient Details  Name: Matthew [name] MRN: [id number] Date of Birth: 1951-07-04  [facility]) CM/SW Contact:    Emeterio Reeve, [place] Phone Number: 05/22/2020, 1:25 PM  Clinical Narrative:                  CSW met with pt at bedside. CSW introduced self and explained her role at the hospital.  Pt reports PTA he was living at home with his wife. Pt reports he was independent with walking and ADL's.   CSW reviewed pt/ot reccs of SNF for rehab. Pt reports he will need to talk to his wife first. Pt reports he is leaning towards going home because his wife will be by herself and he doesn't want that. Pt gave CSW permission to fax out to facilities in the area.   TOC will continue to follow.  Expected Discharge Plan: Skilled Nursing Facility Barriers to Discharge: Continued Medical Work up   Patient Goals and CMS Choice Patient states their goals for this hospitalization and ongoing recovery are:: To get better CMS Medicare.gov Compare Post Acute Care list provided to:: Patient Choice offered to / list presented to : Patient  Expected Discharge Plan and Services Expected Discharge Plan: [place]       Living arrangements for the past 2 months: Single Family Home                                      Prior Living Arrangements/Services Living arrangements for the past 2 months: Single Family Home Lives with:: Spouse Patient language and need for interpreter reviewed:: Yes Do you feel safe going back to the place where you live?: Yes      Need for Family Participation in Patient Care: Yes (Comment) Care giver support system in place?: Yes (comment)   Criminal Activity/Legal Involvement Pertinent to Current Situation/Hospitalization: No - Comment as needed  Activities of Daily Living Home Assistive Devices/Equipment: None ADL Screening (condition at time of admission) Patient's cognitive  ability adequate to safely complete daily activities?: Yes Is the patient deaf or have difficulty hearing?: No Does the patient have difficulty seeing, even when wearing glasses/contacts?: No Does the patient have difficulty concentrating, remembering, or making decisions?: No Patient able to express need for assistance with ADLs?: Yes Does the patient have difficulty dressing or bathing?: No Independently performs ADLs?: Yes (appropriate for developmental age) Does the patient have difficulty walking or climbing stairs?: No Weakness of Legs: None Weakness of Arms/Hands: None  Permission Sought/Granted   Permission granted to share information with : Yes, Verbal Permission Granted     Permission granted to share info w AGENCY: SNF        Emotional Assessment Appearance:: Appears stated age Attitude/Demeanor/Rapport: Engaged Affect (typically observed): Appropriate Orientation: : Oriented to Self, Oriented to Place, Oriented to  Time, Oriented to Situation Alcohol / Substance Use: Not Applicable Psych Involvement: No (comment)  Admission diagnosis:  Femur fracture, left ([place]) [S72.92XA] Patient Active Problem List   Diagnosis Date Noted  . Displaced comminuted fracture of shaft of left femur, initial encounter for closed fracture ([place]) 05/19/2020  . Asthma 05/18/2020  . Essential hypertension 05/18/2020  . Femur fracture, left ([place]) 05/18/2020  . Closed comminuted intra-articular fracture of distal femur ([place]) 05/17/2020  . Acute asthma exacerbation 10/15/2018   PCP:  Tracie Harrier,  MD Pharmacy:   [facility] [address] [place] AT [facility] [address] [place] [address] Phone: [phone] Fax: [phone]     Social Determinants of Health (SDOH) Interventions    Readmission Risk Interventions No flowsheet data found.   Emeterio Reeve, Latanya Presser, [place] Social Worker [phone]

## 2020-05-23 ENCOUNTER — Encounter (HOSPITAL_COMMUNITY): Payer: Self-pay | Admitting: Internal Medicine

## 2020-05-23 LAB — RENAL FUNCTION PANEL
Albumin: 2.3 g/dL — ABNORMAL LOW (ref 3.5–5.0)
Anion gap: 8 (ref 5–15)
BUN: 47 mg/dL — ABNORMAL HIGH (ref 8–23)
CO2: 21 mmol/L — ABNORMAL LOW (ref 22–32)
Calcium: 10.5 mg/dL — ABNORMAL HIGH (ref 8.9–10.3)
Chloride: 109 mmol/L (ref 98–111)
Creatinine, Ser: 2.01 mg/dL — ABNORMAL HIGH (ref 0.61–1.24)
GFR, Estimated: 33 mL/min — ABNORMAL LOW (ref >60)
Glucose, Bld: 130 mg/dL — ABNORMAL HIGH (ref 70–99)
Phosphorus: 3.2 mg/dL (ref 2.5–4.6)
Potassium: 6 mmol/L — ABNORMAL HIGH (ref 3.5–5.1)
Sodium: 138 mmol/L (ref 135–145)

## 2020-05-23 LAB — SARS CORONAVIRUS 2 BY RT PCR (HOSPITAL ORDER, PERFORMED IN ~~LOC~~ HOSPITAL LAB): SARS Coronavirus 2: NEGATIVE

## 2020-05-23 NOTE — Progress Notes (Signed)
During transfers from chair to bed, noted a suture broke off from post incision site on L leg with scant serosanguineous drainage, Mepilex applied, and cold therapy. Bleeding stopped. Pain meds were given. Will continue to monitor area.

## 2020-05-23 NOTE — TOC Progression Note (Signed)
Transition of Care [facility]) - Progression Note    Patient Details  Name: Matthew [name] MRN: [id number] Date of Birth: May 03, 1951  Transition of Care [facility]) CM/SW Contact  Joanne Chars, LCSW Phone Number: 05/23/2020, 11:28 AM  Clinical Narrative:   CSW followed up with pt regarding post acute care choice.  Pt reported that he had not made up his mind but was still leaning towards going home.  CSW asked about support at home and pt reports his wife will be there 24/7.  Pt will speak to her again today and he understands he does need to decide.  Informed him that he has one SNF bed offer currently: [address] [facility]  Pt does have PCP in place.  Pt reports he has a walker and a shower chair at home.  Also said he has a device that helps him get in and out of the tub.      Expected Discharge Plan: [place] Barriers to Discharge: Continued Medical Work up  Expected Discharge Plan and Services Expected Discharge Plan: [place] arrangements for the past 2 months: Single Family Home                                       Social Determinants of Health (SDOH) Interventions    Readmission Risk Interventions No flowsheet data found.

## 2020-05-23 NOTE — Progress Notes (Signed)
PROGRESS NOTE    Matthew [name]  EEF:[id number] DOB: 1951-07-15 DOA: 05/18/2020 PCP: Tracie Harrier, MD  Brief Narrative:  69 year old male known history of asthma HTN, depression, chronic pain on buprenorphine, CKD stage III Last admitted to the hospital 10/2019 with acute asthma exacerbation Presented from home 05/18/2020 with fall leg gave way, no LOC although hit head x-ray distal left femur showed fracture Underwent eventual hip repair by Dr. Doreatha Martin on 10/6 Hospitalization complicated by mild encephalopathy which resolved quickly   Assessment & Plan:   Principal Problem:   Closed comminuted intra-articular fracture of distal femur ([place]) Active Problems:   Asthma   Essential hypertension   Femur fracture, left ([place])   Displaced comminuted fracture of shaft of left femur, initial encounter for closed fracture ([place])   1. Mild encephalopathy morning of 10/8 a. Probably related to some mild hypoxia in the setting of known use of CPAP although he has a habitus he has no diagnosis b. This resolved relatively quickly c. See below 2. Left femoral fracture status post ORIF left supracondylar distal femur fracture + left femoral shaft fracture 10/6 a. Pain control OxyIR 5 every 4 as needed moderate pain, 10 mg every 4 as needed severe pain no IV because of metabolic encephalopathy 88/3 b. TTWB unrestricted ROM to knee-likely need to skilled care 3. Acute superimposed on chronic CKD stage III baseline 1.8 a. Toradol discontinued 10/7 b. Received IVF till 10/10 then stopped c. Bladder scan no retention, creatinine trending closer to normal range, baseline is below 2 4. 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 Desats down to 88-89 off oxygen so probably will need op Oxygen set-up on d/c to SNF b. CXR my over read 10/8 does not show any focal consolidation predominantly atelectasis c. Encourage incentive spirometer and flutter valve 5. Probable OSA given chronic pain on buprenorphine a. Usually  on butrans which has been held b. Resume tizanidine c. Once acute pain component is improved after several weeks at facility will probably be able to resume the Butrans 6. Depression a. Resume Cymblata 60 bid 7. HTN a. 2/2 azotemia, held ACE lisinopril 10 and will need alternative regimen b. Adding amlodipine 10 today  DVT prophylaxis: loveneox Code Status: Full Family Communication: Called wife phone [phone] 10/9 but no answer  Disposition:   Status is: Inpatient  Remains inpatient appropriate because:Persistent severe electrolyte disturbances and IV treatments appropriate due to intensity of illness or inability to take PO   Dispo: The patient is from: Home              Anticipated d/c is to: SNF              Anticipated d/c date is: 2 days              Patient currently is not medically stable to d/c.  Consultants:   ortho  Procedures: nad  Antimicrobials: na    Subjective:  No confusion no cp fever chills  Some pain asking to be medicated no n/v    Objective: Vitals:   05/22/20 1542 05/22/20 1900 05/23/20 0300 05/23/20 0955  BP: 138/63 (!) 145/61 (!) 150/66 137/64  Pulse: 70 85 88 80  Resp: 18 17  16   Temp: 98 F (36.7 C) 98.5 F (36.9 C) 98.7 F (37.1 C) 97.9 F (36.6 C)  TempSrc: Oral Oral Oral Oral  SpO2: 100% 100% 97% 100%  Weight:      Height:        Intake/Output Summary (  Last 24 hours) at 05/23/2020 1258 Last data filed at 05/23/2020 0900 Gross per 24 hour  Intake 1430 ml  Output 1100 ml  Net 330 ml   Filed Weights   05/19/20 0828  Weight: 133.8 kg    Examination:  General exam: coherent pleasant alert in nad no focal deficit Respiratory system: cta b mild hee bil post Cardiovascular system: s1 s2 no m/r/g Gastrointestinal system: abd soft nt dn no rebound no gaurd. Extremities: nad--hass knee immobilizer LLE   Data Reviewed: I have personally reviewed following labs and imaging studies Potassium 6.0-->5.5-->5.0-->6.0 Bun/creat  30/2.3--->39/2.6-->42/2.7-->43/2.3-->45/2.200>47/2.01  Radiology Studies: DG Chest Port 1 View  Result Date: 05/22/2020 CLINICAL DATA:  Acute respiratory distress.  History of asthma. EXAM: PORTABLE CHEST 1 VIEW COMPARISON:  May 21, 2020 FINDINGS: The heart size and mediastinal contours are stable. Both lungs are clear. The visualized skeletal structures are unremarkable. IMPRESSION: No active disease. Electronically Signed   By: Abelardo Diesel M.D.   On: 05/22/2020 08:39     Scheduled Meds: . amLODipine  10 mg Oral Daily  . docusate sodium  100 mg Oral BID  . enoxaparin (LOVENOX) injection  40 mg Subcutaneous Q24H  . gabapentin  300 mg Oral BID  . multivitamin with minerals  1 tablet Oral Daily  . Ensure Max Protein  11 oz Oral QHS  . sodium chloride flush  10-40 mL Intracatheter Q12H   Continuous Infusions: . methocarbamol (ROBAXIN) IV       LOS: 5 days    Time spent: [place], MD [facility] To contact the attending provider between 7A-7P or the covering provider during after hours 7P-7A, please log into the web site [email or web address] and access using universal [place] password for that web site. If you do not have the password, please call the hospital operator.  05/23/2020, 12:58 PM

## 2020-05-23 NOTE — Plan of Care (Signed)
  Problem: Pain Managment: Goal: General experience of comfort will improve Outcome: Progressing   Problem: Safety: Goal: Ability to remain free from injury will improve Outcome: Progressing   

## 2020-05-24 LAB — CBC WITH DIFFERENTIAL/PLATELET
Abs Immature Granulocytes: 0.06 10*3/uL (ref 0.00–0.07)
Basophils Absolute: 0 10*3/uL (ref 0.0–0.1)
Basophils Relative: 0 %
Eosinophils Absolute: 0.4 10*3/uL (ref 0.0–0.5)
Eosinophils Relative: 7 %
HCT: 27.1 % — ABNORMAL LOW (ref 39.0–52.0)
Hemoglobin: 8.3 g/dL — ABNORMAL LOW (ref 13.0–17.0)
Immature Granulocytes: 1 %
Lymphocytes Relative: 18 %
Lymphs Abs: 1 10*3/uL (ref 0.7–4.0)
MCH: 33.9 pg (ref 26.0–34.0)
MCHC: 30.6 g/dL (ref 30.0–36.0)
MCV: 110.6 fL — ABNORMAL HIGH (ref 80.0–100.0)
Monocytes Absolute: 0.7 10*3/uL (ref 0.1–1.0)
Monocytes Relative: 12 %
Neutro Abs: 3.4 10*3/uL (ref 1.7–7.7)
Neutrophils Relative %: 62 %
Platelets: 218 10*3/uL (ref 150–400)
RBC: 2.45 MIL/uL — ABNORMAL LOW (ref 4.22–5.81)
RDW: 13.5 % (ref 11.5–15.5)
WBC: 5.6 10*3/uL (ref 4.0–10.5)
nRBC: 0 % (ref 0.0–0.2)

## 2020-05-24 LAB — RENAL FUNCTION PANEL
Albumin: 2 g/dL — ABNORMAL LOW (ref 3.5–5.0)
Anion gap: 5 (ref 5–15)
BUN: 47 mg/dL — ABNORMAL HIGH (ref 8–23)
CO2: 26 mmol/L (ref 22–32)
Calcium: 10.4 mg/dL — ABNORMAL HIGH (ref 8.9–10.3)
Chloride: 107 mmol/L (ref 98–111)
Creatinine, Ser: 1.72 mg/dL — ABNORMAL HIGH (ref 0.61–1.24)
GFR, Estimated: 39 mL/min — ABNORMAL LOW (ref >60)
Glucose, Bld: 113 mg/dL — ABNORMAL HIGH (ref 70–99)
Phosphorus: 3.2 mg/dL (ref 2.5–4.6)
Potassium: 5.6 mmol/L — ABNORMAL HIGH (ref 3.5–5.1)
Sodium: 138 mmol/L (ref 135–145)

## 2020-05-24 MED ORDER — POLYETHYLENE GLYCOL 3350 17 G PO PACK: 17.0000 g | PACK | Freq: Every day | ORAL | 0 refills | Status: DC | PRN

## 2020-05-24 MED ORDER — AMLODIPINE BESYLATE 10 MG PO TABS: 10.0000 mg | ORAL_TABLET | Freq: Every day | ORAL | Status: DC

## 2020-05-24 MED ORDER — GABAPENTIN 300 MG PO CAPS
300.0000 mg | ORAL_CAPSULE | Freq: Two times a day (BID) | ORAL | 0 refills | Status: DC
Start: 2020-05-24 — End: 2021-06-23

## 2020-05-24 MED ORDER — DULOXETINE HCL 60 MG PO CPEP: 60.0000 mg | ORAL_CAPSULE | Freq: Two times a day (BID) | ORAL | 0 refills | Status: DC

## 2020-05-24 NOTE — TOC Progression Note (Addendum)
Transition of Care [facility]) - Progression Note    Patient Details  Name: Matthew [name] MRN: [id number] Date of Birth: [birth year]  Transition of Care [facility]) CM/SW Contact  Sharin Mons, RN Phone Number: [phone] 05/24/2020, 10:33 AM  Clinical Narrative:    NCM spoke with pt in regard to SNF bed offers. Pt selected [facility]. [facility]CM called [facility] admission and left voice regarding pt's selection ... awaiting call back hoping SNF bed offer extended. Once accepted by SNF, SNF will need to initiate insurance authorization. [employer] doesn't manage pt 's insurance.  TOC team will continue to monitor and follow .Marland Kitchen..   05/24/2020 @ 4;30 pm  Bed offer extended by [facility] SNF. Authorization pending....  Expected Discharge Plan: [place] Barriers to Discharge: Insurance Authorization  Expected Discharge Plan and Services Expected Discharge Plan: [place] arrangements for the past 2 months: Single Family Home Expected Discharge Date: 05/24/20                                     Social Determinants of Health (SDOH) Interventions    Readmission Risk Interventions No flowsheet data found.

## 2020-05-24 NOTE — Progress Notes (Signed)
Orthopaedic Trauma Progress Note  S: Sleeping. No ortho issues  O:  Vitals:   05/24/20 0802 05/24/20 1204  BP: (!) 163/73   Pulse: 62   Resp: 20   Temp: (!) 97.5 F (36.4 C)   SpO2: 98% (!) 72%   No acute distress. Exam unchanged.  Imaging: Stable postoperative imaging  Labs:  Results for orders placed or performed during the hospital encounter of 05/18/20 (from the past 24 hour(s))  SARS Coronavirus 2 by RT PCR (hospital order, performed in [place] hospital lab) Nasopharyngeal Nasopharyngeal Swab     Status: None   Collection Time: 05/23/20  7:50 PM   Specimen: Nasopharyngeal Swab  Result Value Ref Range   SARS Coronavirus 2 NEGATIVE NEGATIVE  CBC with Differential/Platelet     Status: Abnormal   Collection Time: 05/24/20  1:00 AM  Result Value Ref Range   WBC 5.6 4.0 - 10.5 K/uL   RBC 2.45 (L) 4.22 - 5.81 MIL/uL   Hemoglobin 8.3 (L) 13.0 - 17.0 g/dL   HCT 27.1 (L) 39 - 52 %   MCV 110.6 (H) 80.0 - 100.0 fL   MCH 33.9 26.0 - 34.0 pg   MCHC 30.6 30.0 - 36.0 g/dL   RDW 13.5 11.5 - 15.5 %   Platelets 218 150 - 400 K/uL   nRBC 0.0 0.0 - 0.2 %   Neutrophils Relative % 62 %   Neutro Abs 3.4 1.7 - 7.7 K/uL   Lymphocytes Relative 18 %   Lymphs Abs 1.0 0.7 - 4.0 K/uL   Monocytes Relative 12 %   Monocytes Absolute 0.7 0.1 - 1.0 K/uL   Eosinophils Relative 7 %   Eosinophils Absolute 0.4 0 - 0 K/uL   Basophils Relative 0 %   Basophils Absolute 0.0 0 - 0 K/uL   Immature Granulocytes 1 %   Abs Immature Granulocytes 0.06 0.00 - 0.07 K/uL   Polychromasia PRESENT   Renal function panel     Status: Abnormal   Collection Time: 05/24/20  1:00 AM  Result Value Ref Range   Sodium 138 135 - 145 mmol/L   Potassium 5.6 (H) 3.5 - 5.1 mmol/L   Chloride 107 98 - 111 mmol/L   CO2 26 22 - 32 mmol/L   Glucose, Bld 113 (H) 70 - 99 mg/dL   BUN 47 (H) 8 - 23 mg/dL   Creatinine, Ser 1.72 (H) 0.61 - 1.24 mg/dL   Calcium 10.4 (H) 8.9 - 10.3 mg/dL   Phosphorus 3.2 2.5 - 4.6 mg/dL    Albumin 2.0 (L) 3.5 - 5.0 g/dL   GFR, Estimated 39 (L) >60 mL/min   Anion gap 5 5 - 15    Assessment: 69 year old male status post fall  Injuries: Left intra-articular distal femur fracture with femoral shaft extension status post open reduction internal fixation on 05/19/2020  Weightbearing: Touchdown weightbearing left lower extremity, unrestricted range of motion of the knee  Insicional and dressing care: Okay to leave dressing open to air  Orthopedic device(s): None  CV/Blood loss: Hemoglobin this morning 8.3 from 10.7 from 13.0 consistent with acute blood loss anemia  Pain management: 1.  Gabapentin 300mg  twice daily 2.  Dilaudid 1.5 to 1 mg every 4 hours as needed 3.  Robaxin 500 mg every 6 hours as needed 4.  Oxycodone 5 to 15 mg every 4 hours.  VTE prophylaxis: Lovenox   ID: [email or web address].  Postoperative surgical prophylaxis.  Foley/Lines: No Foley  Medical co-morbidities: Per hospitalist service  Dispo: PT  OT.  Likely will need skilled nursing facility. Okay to dc from orthopaedic standpoint. Scripts for lovenox and pain meds signed and placed on chart  Follow - up plan: 2 weeks for x-rays and suture removal  Shona Needles, MD [facility] [phone] (office) [email or web address]

## 2020-05-24 NOTE — Care Management Important Message (Signed)
Important Message  Patient Details  Name: Matthew [name] MRN: [id number] Date of Birth: 10-18-1950   Medicare Important Message Given:  Yes - Important Message mailed due to current National Emergency  Verbal consent obtained due to current National Emergency  Relationship to patient: Self Contact Name: Airon Sahni Call Date: 05/24/20  Time: 1214 Phone: [phone] Outcome: Spoke with contact Important Message mailed to: Patient address on file    Delorse Lek 05/24/2020, 12:14 PM
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
## 2020-05-24 NOTE — Progress Notes (Signed)
Physical Therapy Treatment Patient Details Name: Matthew [name] MRN: [id number] DOB: May 13, 1951 Today's Date: 05/24/2020    History of Present Illness 69 year old male known history of asthma HTN, depression, chronic pain on buprenorphine, CKD stage IIILast admitted to the hospital 10/2019 with acute asthma exacerbationPresented from home 05/18/2020 with fall leg gave way, no LOC although hit head x-ray distal left femur showed fracture. S/P L femur ORIF    PT Comments    Pt is making slow progress with mobility. Pt is unable to tolerate OOB to chair secondary to desaturation, fatigue, and TDWB status. Pt will need lift OOB or education in sliding board w/c level transfers. Pt was declining SNF, but after therapy today he acknowledged he can not d/c home. I have downgraded some goals and decreased PT frequency to min 3x/wk. Pt will continue to benefit from continued acute PT to maximize mobility and Independence for the next venue of care.   SNF     Equipment Recommendations  Other (comment);None recommended by PT    Recommendations for Other Services       Precautions / Restrictions Precautions Precautions: Fall Precaution Comments: Monitor O2 Restrictions LLE Weight Bearing: Touchdown weight bearing    Mobility  Bed Mobility Overal bed mobility: Needs Assistance Bed Mobility: Supine to Sit     Supine to sit: Min assist;HOB elevated Sit to supine: Mod assist;HOB elevated   General bed mobility comments: cues for technique and use of bed features, BLE assist, HOB partially elevated  Transfers Overall transfer level: Needs assistance Equipment used: Rolling walker (2 wheeled) Transfers: Sit to/from Stand          Lateral/Scoot Transfers: From elevated surface;+2 physical assistance;Mod assist General transfer comment: Pt unable to fully stand upright with RW and had difficulty maintaining TDWB. Pt continues to desat and generallly weak. Pt unable to transfer OOB to chair  and returned pt supine.  Ambulation/Gait                 Stairs             Wheelchair Mobility    Modified Rankin (Stroke Patients Only)       Balance Overall balance assessment: Needs assistance Sitting-balance support: Bilateral upper extremity supported Sitting balance-Leahy Scale: Fair Sitting balance - Comments: pt able to sit unsupported briefly while static sitting in bed, however frequently with BUE support and posterior lean 2/2 LLE pain and shortness of breath Postural control: Posterior lean Standing balance support: Bilateral upper extremity supported Standing balance-Leahy Scale: Poor                              Cognition                                       General Comments: Pt wants to d/c home with his wife. Pt currently lives in a mobile home. Pt has decreased awareness of level of assistance he needs. After therapy session, he acknowledged he needs SNF prior to d/c home.      Exercises General Exercises - Lower Extremity Ankle Circles/Pumps: AROM;Strengthening;Both;10 reps;Supine Quad Sets: AROM;Strengthening;Both;10 reps;Supine Long Arc Quad: AROM;Strengthening;Both;10 reps;Seated Heel Slides: AAROM;Strengthening;Left;10 reps;Supine Hip ABduction/ADduction: AAROM;Strengthening;Left;10 reps;Supine    General Comments General comments (skin integrity, edema, etc.): Multiple rest breaks for desaturation. O2 sats down in the low 70s with O2 rate at  4 liters. Reinforced pursed lip breathing.      Pertinent Vitals/Pain Pain Assessment: Faces Faces Pain Scale: Hurts a little bit Pain Location: LLE Pain Descriptors / Indicators: Discomfort Pain Intervention(s): Limited activity within patient's tolerance;Monitored during session;Repositioned    Home Living                      Prior Function            PT Goals (current goals can now be found in the care plan section) Acute Rehab PT  Goals Potential to Achieve Goals: Fair Progress towards PT goals: Goals downgraded-see care plan    Frequency    Min 3X/week      PT Plan Frequency needs to be updated;Discharge plan needs to be updated    Co-evaluation              AM-PAC PT "6 Clicks" Mobility   Outcome Measure  Help needed turning from your back to your side while in a flat bed without using bedrails?: A Little Help needed moving from lying on your back to sitting on the side of a flat bed without using bedrails?: A Little Help needed moving to and from a bed to a chair (including a wheelchair)?: A Lot Help needed standing up from a chair using your arms (e.g., wheelchair or bedside chair)?: Total   Help needed climbing 3-5 steps with a railing? : Total 6 Click Score: 10    End of Session Equipment Utilized During Treatment: Gait belt Activity Tolerance: Treatment limited secondary to medical complications (Comment);Patient limited by fatigue Patient left: in bed;with call bell/phone within reach;with bed alarm set Nurse Communication: Mobility status;Weight bearing status;Precautions PT Visit Diagnosis: Muscle weakness (generalized) (M62.81);Difficulty in walking, not elsewhere classified (R26.2)     Time: 4235-3614 PT Time Calculation (min) (ACUTE ONLY): 38 min  Charges:  $Therapeutic Exercise: 8-22 mins $Therapeutic Activity: 23-37 mins                       Lelon Mast 05/24/2020, 12:25 PM

## 2020-05-25 MED ORDER — ORAL CARE MOUTH RINSE
15.0000 mL | Freq: Two times a day (BID) | OROMUCOSAL | Status: DC
  Administered 2020-05-25 – 2020-05-27 (×4): 15 mL via OROMUCOSAL

## 2020-05-25 NOTE — Consult Note (Signed)
   [name] Inpatient Consult   05/25/2020  Matthew [name] [name] July 27, 1951 [id number]   [facility] Patient: [name]  Chart reviewed for less than 7 days readmission. Review of PT and inpatient TOC reviews patient is currently for a skilled nursing facility stay for rehab.   No [facility] follow up accessed at this time, needs to be met at the skilled nursing facility for rehab.  Natividad Brood, RN BSN [facility] Liaison  [phone] business mobile phone Toll free office [phone]  Fax number: [phone] [email or web address]
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
## 2020-05-26 DIAGNOSIS — D649 Anemia, unspecified: Secondary | ICD-10-CM

## 2020-05-26 DIAGNOSIS — G894 Chronic pain syndrome: Secondary | ICD-10-CM

## 2020-05-26 LAB — BASIC METABOLIC PANEL
Anion gap: 8 (ref 5–15)
BUN: 34 mg/dL — ABNORMAL HIGH (ref 8–23)
CO2: 27 mmol/L (ref 22–32)
Calcium: 9.8 mg/dL (ref 8.9–10.3)
Chloride: 105 mmol/L (ref 98–111)
Creatinine, Ser: 1.76 mg/dL — ABNORMAL HIGH (ref 0.61–1.24)
GFR, Estimated: 38 mL/min — ABNORMAL LOW (ref >60)
Glucose, Bld: 103 mg/dL — ABNORMAL HIGH (ref 70–99)
Potassium: 5.1 mmol/L (ref 3.5–5.1)
Sodium: 140 mmol/L (ref 135–145)

## 2020-05-26 LAB — CBC
HCT: 27.7 % — ABNORMAL LOW (ref 39.0–52.0)
Hemoglobin: 8.5 g/dL — ABNORMAL LOW (ref 13.0–17.0)
MCH: 33.9 pg (ref 26.0–34.0)
MCHC: 30.7 g/dL (ref 30.0–36.0)
MCV: 110.4 fL — ABNORMAL HIGH (ref 80.0–100.0)
Platelets: 243 10*3/uL (ref 150–400)
RBC: 2.51 MIL/uL — ABNORMAL LOW (ref 4.22–5.81)
RDW: 13.6 % (ref 11.5–15.5)
WBC: 5.7 10*3/uL (ref 4.0–10.5)
nRBC: 0 % (ref 0.0–0.2)

## 2020-05-26 NOTE — TOC Progression Note (Signed)
Transition of Care [facility]) - Progression Note    Patient Details  Name: Matthew [name] MRN: [id number] Date of Birth: Aug 16, 1950  [facility] CM/SW Contact  Sharin Mons, RN Phone Number: 05/26/2020, 9:35 AM  Clinical Narrative:    SNF authorization pending. NCM has reached out to [facility] admission to learn of status, voice message left . Awaiting call back...  TOC team will continue to monitor ....   Expected Discharge Plan: [place] ([facility] SNF) Barriers to Discharge: Insurance Authorization  Expected Discharge Plan and Services Expected Discharge Plan: [place] ([facility] SNF)       Living arrangements for the past 2 months: Single Family Home Expected Discharge Date: 05/24/20                                     Social Determinants of Health (SDOH) Interventions    Readmission Risk Interventions No flowsheet data found.

## 2020-05-26 NOTE — Progress Notes (Signed)
Occupational Therapy Treatment Patient Details Name: Matthew [name] MRN: [id number] DOB: [birth year] Today's Date: 05/26/2020    History of present illness 69 year old male known history of asthma HTN, depression, chronic pain on buprenorphine, CKD stage IIILast admitted to the hospital 10/2019 with acute asthma exacerbationPresented from home 05/18/2020 with fall leg gave way, no LOC although hit head x-ray distal left femur showed fracture. S/P L femur ORIF   OT comments  Pt making excellent progress towards OT goals and motivated to participate. Session focused on LB dressing using AE and compensatory strategies. Educated pt on need to don/doff clothing over waist in seated position until strength regained to maintain WB precautions in standing. Pt able to demo use of AE (reacher, sock aide) for socks and pants without assistance. Pt reports he has these devices at home and familiar with their use from previous hip surgeries. Pt overall min guard to scoot along EOB, noted to hold breath with one desat to 88%. Otherwise, pt maintained >90% throughout session on 2 L O2 with use of ear probe. Plan to trial sliding board vs scoot transfer OOB during next session.    Follow Up Recommendations  SNF    Equipment Recommendations  3 in 1 bedside commode;Wheelchair (measurements OT);Wheelchair cushion (measurements OT)    Recommendations for Other Services      Precautions / Restrictions Precautions Precautions: Fall Precaution Comments: Monitor O2 Restrictions Weight Bearing Restrictions: Yes LLE Weight Bearing: Touchdown weight bearing       Mobility Bed Mobility Overal bed mobility: Needs Assistance Bed Mobility: Supine to Sit;Sit to Supine     Supine to sit: Min guard;HOB elevated Sit to supine: Mod assist   General bed mobility comments: Min guard for safety of L LE to sit EOB, Mod A for return to bed with assistance needed for B LE advancement  Transfers Overall transfer level:  Needs assistance   Transfers: Lateral/Scoot Transfers          Lateral/Scoot Transfers: Min guard General transfer comment: min guard for lateral scooting to [facility]    Balance Overall balance assessment: Needs assistance Sitting-balance support: No upper extremity supported;Feet supported Sitting balance-Leahy Scale: Fair                                     ADL either performed or assessed with clinical judgement   ADL Overall ADL's : Needs assistance/impaired                     Lower Body Dressing: Minimal assistance;Sitting/lateral leans Lower Body Dressing Details (indicate cue type and reason): Guided pt in LB dressing using various AE and educated on lateral leans sitting EOB for donning over waist until strong enough to stand while maintaining LE precautions. Pt able to demonstrate use of reacher and sock aide to doff/don socks and doff/don pants to thighs. Pt would require Min A to don over waist with lateral leans               General ADL Comments: Limited by decreased LE strength, ability to maintain TDWB precautions, and decreased cardiopulmonary tolerance     Vision   Vision Assessment?: No apparent visual deficits   Perception     Praxis      Cognition Arousal/Alertness: Awake/alert Behavior During Therapy: WFL for tasks assessed/performed Overall Cognitive Status: Within Functional Limits for tasks assessed  Exercises     Shoulder Instructions       General Comments Pt received after PTA session on 4 L O2 (was on 2 L o2 but experienced desats). Trialed ear probe with higher readings and pt able to maintain >90% throughout session on 2 L O2. One instance on 88% while scooting EOB, suspect due to pt holding breath. Pt was noted with 3/4 DOE during activities, endorses being a mouth breather    Pertinent Vitals/ Pain       Pain Assessment: Faces Faces Pain Scale: Hurts  little more Pain Location: L knee, R shin Pain Descriptors / Indicators: Discomfort;Sore Pain Intervention(s): Limited activity within patient's tolerance;Monitored during session;Repositioned;Ice applied;RN gave pain meds during session  Home Living                                          Prior Functioning/Environment              Frequency  Min 2X/week        Progress Toward Goals  OT Goals(current goals can now be found in the care plan section)  Progress towards OT goals: Progressing toward goals  Acute Rehab OT Goals Patient Stated Goal: get stronger, have exercise handout  OT Goal Formulation: With patient Time For Goal Achievement: 06/03/20 Potential to Achieve Goals: Good ADL Goals Pt Will Perform Grooming: sitting;with set-up Pt Will Perform Lower Body Bathing: with mod assist;sit to/from stand Pt Will Perform Lower Body Dressing: with mod assist;sit to/from stand Pt Will Transfer to Toilet: stand pivot transfer;with mod assist Pt Will Perform Toileting - Clothing Manipulation and hygiene: with supervision;sit to/from stand  Plan Discharge plan remains appropriate    Co-evaluation                 AM-PAC OT "6 Clicks" Daily Activity     Outcome Measure   Help from another person eating meals?: None Help from another person taking care of personal grooming?: A Little Help from another person toileting, which includes using toliet, bedpan, or urinal?: A Lot Help from another person bathing (including washing, rinsing, drying)?: A Lot Help from another person to put on and taking off regular upper body clothing?: A Little Help from another person to put on and taking off regular lower body clothing?: A Little 6 Click Score: 17    End of Session Equipment Utilized During Treatment: Oxygen  OT Visit Diagnosis: Unsteadiness on feet (R26.81);Muscle weakness (generalized) (M62.81);Pain Pain - Right/Left: Left Pain - part of body:  Knee;Leg   Activity Tolerance Patient tolerated treatment well   Patient Left in bed;with call bell/phone within reach;with bed alarm set   Nurse Communication Mobility status;Other (comment) (O2)        Time: 4158-3094 OT Time Calculation (min): 40 min  Charges: OT General Charges $OT Visit: 1 Visit OT Treatments $Self Care/Home Management : 23-37 mins $Therapeutic Activity: 8-22 mins  Layla Maw, OTR/L   Layla Maw 05/26/2020, 1:36 PM

## 2020-05-26 NOTE — Progress Notes (Signed)
[facility] PROGRESS NOTE   Matthew [name] FYB:[id number] DOB: [birth year] DOA: 05/18/2020  PCP: Tracie Harrier, MD  Brief History/Interval Summary: 69 year old male known history of asthma HTN, depression, chronic pain on buprenorphine, CKD stage III Last admitted to the hospital 10/2019 with acute asthma exacerbation Presented from home 05/18/2020 with fall leg gave way, no LOC although hit head x-ray distal left femur showed fracture Underwent eventual hip repair by Dr. Doreatha Martin on 10/6 Hospitalization complicated by mild encephalopathy which resolved quickly  Procedures:  Hip repair 10/6  Consultations:  Orthopedics Dr. Doreatha Martin   Antibiotics: Anti-infectives (From admission, onward)   Start     Dose/Rate Route Frequency Ordered Stop   05/19/20 1600  ceFAZolin (ANCEF) IVPB 2g/100 mL premix        2 g 200 mL/hr over 30 Minutes Intravenous Every 8 hours 05/19/20 1502 05/20/20 0622   05/19/20 1158  vancomycin (VANCOCIN) powder  Status:  Discontinued          As needed 05/19/20 1159 05/19/20 1316   05/19/20 0600  ceFAZolin (ANCEF) 3 g in dextrose 5 % 50 mL IVPB        3 g 100 mL/hr over 30 Minutes Intravenous On call to O.R. 05/18/20 1208 05/19/20 1138      Subjective/Interval History: Patient complains of pain in his left leg at times.  Denies any chest pain or shortness of breath.  No nausea vomiting.     Assessment/Plan:  Left femur fracture Patient is status post ORIF of the left supracondylar distal femur fracture on 10/6.  Pain control.  PT and OT.  Will need short-term rehab at a skilled nursing facility.  Lovenox for DVT prophylaxis for 1 month.  History of OHS/OSA BMI greater than 37.  Patient noted to desaturate into the late 80s of oxygen.  Currently on oxygen with nasal cannula.  Denies any shortness of breath.  Acute on chronic kidney disease stage IIIa/hyperkalemia Baseline creatinine around 1.8.  Toradol was discontinued.  Patient received IV  fluids.  No urinary retention noted.  Creatinine noted to be stable.  Continue to monitor urine output.  Potassium was noted to be normal today.  Acute metabolic encephalopathy This occurred on 10/8.  Likely in the setting of hypoxia.  He was back to baseline quickly.  Currently seems to be at baseline.  Anemia likely secondary to acute blood loss/anemia of chronic disease Drop in hemoglobin noted likely due to surgery.  Stable.  Did not require any transfusion.  Chronic pain syndrome Usually on Butrans which has been held.  Tizanidine was resumed.  Once his acute pain has improved and he is no longer on oxycodone, butrans can be restarted.  History of depression Cymbalta.  Essential hypertension ACE inhibitor was held.  Amlodipine was added.  Blood pressure is reasonably well controlled.  Obesity Estimated body mass index is 37.88 kg/m as calculated from the following:   Height as of this encounter: 6\' 2"  (1.88 m).   Weight as of this encounter: 133.8 kg.   DVT Prophylaxis: Lovenox Code Status: Full code Family Communication: No family at bedside Disposition Plan: Waiting on insurance authorization for skilled nursing facility  Status is: Inpatient  Remains inpatient appropriate because:Unsafe d/c plan   Dispo: The patient is from: Home              Anticipated d/c is to: SNF              Anticipated d/c date is: 1 day  Patient currently is medically stable to d/c.     Medications:  Scheduled: . amLODipine  10 mg Oral Daily  . docusate sodium  100 mg Oral BID  . enoxaparin (LOVENOX) injection  40 mg Subcutaneous Q24H  . gabapentin  300 mg Oral BID  . mouth rinse  15 mL Mouth Rinse BID  . multivitamin with minerals  1 tablet Oral Daily  . Ensure Max Protein  11 oz Oral QHS  . sodium chloride flush  10-40 mL Intracatheter Q12H   Continuous: . methocarbamol (ROBAXIN) IV     XBJ:YNWGNFAOZ, hydrALAZINE, methocarbamol **OR** methocarbamol (ROBAXIN) IV,  metoCLOPramide **OR** metoCLOPramide (REGLAN) injection, naLOXone (NARCAN)  injection, ondansetron **OR** ondansetron (ZOFRAN) IV, oxyCODONE, oxyCODONE, polyethylene glycol, sodium chloride flush   Objective:  Vital Signs  Vitals:   05/25/20 1615 05/25/20 1900 05/26/20 0500 05/26/20 0853  BP: (!) 135/48 136/68 (!) 141/53 (!) 144/60  Pulse: 71 69 68 73  Resp: 18 17 18 20   Temp: (!) 97.5 F (36.4 C) 98 F (36.7 C) 98.3 F (36.8 C) 98.7 F (37.1 C)  TempSrc: Oral Oral Oral Oral  SpO2: 93% 96% 97% 97%  Weight:      Height:        Intake/Output Summary (Last 24 hours) at 05/26/2020 1035 Last data filed at 05/26/2020 0500 Gross per 24 hour  Intake 240 ml  Output 3050 ml  Net -2810 ml   Filed Weights   05/19/20 0828  Weight: 133.8 kg    General appearance: Awake alert.  In no distress Resp: Clear to auscultation bilaterally.  Normal effort Cardio: S1-S2 is normal regular.  No S3-S4.  No rubs murmurs or bruit GI: Abdomen is soft.  Nontender nondistended.  Bowel sounds are present normal.  No masses organomegaly Extremities: No edema.  No bruising noted over the left leg Neurologic:   No focal neurological deficits.    Lab Results:  Data Reviewed: I have personally reviewed following labs and imaging studies  CBC: Recent Labs  Lab 05/20/20 0244 05/21/20 0350 05/22/20 0420 05/24/20 0100 05/26/20 0839  WBC 12.5* 8.9 7.9 5.6 5.7  NEUTROABS 10.4* 6.1 5.0 3.4  --   HGB 10.7* 8.9* 9.2* 8.3* 8.5*  HCT 34.5* 28.8* 29.1* 27.1* 27.7*  MCV 111.3* 111.2* 112.4* 110.6* 110.4*  PLT 257 229 244 218 308    Basic Metabolic Panel: Recent Labs  Lab 05/21/20 0350 05/22/20 0420 05/23/20 0925 05/24/20 0100 05/26/20 0839  NA 137 138 138 138 140  K 5.1 5.2* 6.0* 5.6* 5.1  CL 108 109 109 107 105  CO2 22 23 21* 26 27  GLUCOSE 120* 117* 130* 113* 103*  BUN 43* 45* 47* 47* 34*  CREATININE 2.39* 2.23* 2.01* 1.72* 1.76*  CALCIUM 10.1 10.4* 10.5* 10.4* 9.8  PHOS  --   --  3.2  3.2  --     GFR: Estimated Creatinine Clearance: 56.8 mL/min (A) (by C-G formula based on SCr of 1.76 mg/dL (H)).  Liver Function Tests: Recent Labs  Lab 05/20/20 0244 05/21/20 0350 05/22/20 0420 05/23/20 0925 05/24/20 0100  AST 26 17 17   --   --   ALT 23 7 6   --   --   ALKPHOS 98 74 78  --   --   BILITOT 0.3 0.6 0.5  --   --   PROT 6.2* 5.3* 5.7*  --   --   ALBUMIN 2.7* 2.1* 2.3* 2.3* 2.0*     Recent Results (from the past 240  hour(s))  Respiratory Panel by RT PCR (Flu A&B, Covid) - Nasopharyngeal Swab     Status: None   Collection Time: 05/17/20  6:16 PM   Specimen: Nasopharyngeal Swab  Result Value Ref Range Status   SARS Coronavirus 2 by RT PCR NEGATIVE NEGATIVE Final    Comment: (NOTE) SARS-CoV-2 target nucleic acids are NOT DETECTED.  The SARS-CoV-2 RNA is generally detectable in upper respiratoy specimens during the acute phase of infection. The lowest concentration of SARS-CoV-2 viral copies this assay can detect is 131 copies/mL. A negative result does not preclude SARS-Cov-2 infection and should not be used as the sole basis for treatment or other patient management decisions. A negative result may occur with  improper specimen collection/handling, submission of specimen other than nasopharyngeal swab, presence of viral mutation(s) within the areas targeted by this assay, and inadequate number of viral copies (<131 copies/mL). A negative result must be combined with clinical observations, patient history, and epidemiological information. The expected result is Negative.  Fact Sheet for Patients:  PinkCheek.be  Fact Sheet for Healthcare Providers:  GravelBags.it  This test is no t yet approved or cleared by the [place] FDA and  has been authorized for detection and/or diagnosis of SARS-CoV-2 by FDA under an Emergency Use Authorization (EUA). This EUA will remain  in effect (meaning this test  can be used) for the duration of the COVID-19 declaration under Section 564(b)(1) of the Act, 21 U.S.C. section 360bbb-3(b)(1), unless the authorization is terminated or revoked sooner.     Influenza A by PCR NEGATIVE NEGATIVE Final   Influenza B by PCR NEGATIVE NEGATIVE Final    Comment: (NOTE) The Xpert Xpress SARS-CoV-2/FLU/RSV assay is intended as an aid in  the diagnosis of influenza from Nasopharyngeal swab specimens and  should not be used as a sole basis for treatment. Nasal washings and  aspirates are unacceptable for Xpert Xpress SARS-CoV-2/FLU/RSV  testing.  Fact Sheet for Patients: PinkCheek.be  Fact Sheet for Healthcare Providers: GravelBags.it  This test is not yet approved or cleared by the [place] FDA and  has been authorized for detection and/or diagnosis of SARS-CoV-2 by  FDA under an Emergency Use Authorization (EUA). This EUA will remain  in effect (meaning this test can be used) for the duration of the  Covid-19 declaration under Section 564(b)(1) of the Act, 21  U.S.C. section 360bbb-3(b)(1), unless the authorization is  terminated or revoked. Performed at [facility], [address] [place], [place] [address]   MRSA PCR Screening     Status: None   Collection Time: 05/18/20  5:45 PM  Result Value Ref Range Status   MRSA by PCR NEGATIVE NEGATIVE Final    Comment:        The GeneXpert MRSA Assay (FDA approved for NASAL specimens only), is one component of a comprehensive MRSA colonization surveillance program. It is not intended to diagnose MRSA infection nor to guide or monitor treatment for MRSA infections. Performed at [facility], [place] [address], [place], [place] [address]   SARS Coronavirus 2 by RT PCR (hospital order, performed in [facility] hospital lab) Nasopharyngeal Nasopharyngeal Swab     Status: None   Collection Time: 05/20/20  4:41 PM   Specimen:  Nasopharyngeal Swab  Result Value Ref Range Status   SARS Coronavirus 2 NEGATIVE NEGATIVE Final    Comment: (NOTE) SARS-CoV-2 target nucleic acids are NOT DETECTED.  The SARS-CoV-2 RNA is generally detectable in upper and lower respiratory specimens  during the acute phase of infection. The lowest concentration of SARS-CoV-2 viral copies this assay can detect is 250 copies / mL. A negative result does not preclude SARS-CoV-2 infection and should not be used as the sole basis for treatment or other patient management decisions.  A negative result may occur with improper specimen collection / handling, submission of specimen other than nasopharyngeal swab, presence of viral mutation(s) within the areas targeted by this assay, and inadequate number of viral copies (<250 copies / mL). A negative result must be combined with clinical observations, patient history, and epidemiological information.  Fact Sheet for Patients:   StrictlyIdeas.no  Fact Sheet for Healthcare Providers: BankingDealers.co.za  This test is not yet approved or  cleared by the [place] FDA and has been authorized for detection and/or diagnosis of SARS-CoV-2 by FDA under an Emergency Use Authorization (EUA).  This EUA will remain in effect (meaning this test can be used) for the duration of the COVID-19 declaration under Section 564(b)(1) of the Act, 21 U.S.C. section 360bbb-3(b)(1), unless the authorization is terminated or revoked sooner.  Performed at [facility], [place] [address], [place], [place] [address]   SARS Coronavirus 2 by RT PCR (hospital order, performed in [facility] hospital lab) Nasopharyngeal Nasopharyngeal Swab     Status: None   Collection Time: 05/23/20  7:50 PM   Specimen: Nasopharyngeal Swab  Result Value Ref Range Status   SARS Coronavirus 2 NEGATIVE NEGATIVE Final    Comment: (NOTE) SARS-CoV-2 target nucleic acids are NOT  DETECTED.  The SARS-CoV-2 RNA is generally detectable in upper and lower respiratory specimens during the acute phase of infection. The lowest concentration of SARS-CoV-2 viral copies this assay can detect is 250 copies / mL. A negative result does not preclude SARS-CoV-2 infection and should not be used as the sole basis for treatment or other patient management decisions.  A negative result may occur with improper specimen collection / handling, submission of specimen other than nasopharyngeal swab, presence of viral mutation(s) within the areas targeted by this assay, and inadequate number of viral copies (<250 copies / mL). A negative result must be combined with clinical observations, patient history, and epidemiological information.  Fact Sheet for Patients:   StrictlyIdeas.no  Fact Sheet for Healthcare Providers: BankingDealers.co.za  This test is not yet approved or  cleared by the [place] FDA and has been authorized for detection and/or diagnosis of SARS-CoV-2 by FDA under an Emergency Use Authorization (EUA).  This EUA will remain in effect (meaning this test can be used) for the duration of the COVID-19 declaration under Section 564(b)(1) of the Act, 21 U.S.C. section 360bbb-3(b)(1), unless the authorization is terminated or revoked sooner.  Performed at [facility], [place] [address], [place], [place] [address]       Radiology Studies: No results found.     LOS: 8 days   Neyah Ellerman [employer] on [email or web address]  05/26/2020, 10:35 AM

## 2020-05-26 NOTE — Progress Notes (Signed)
Physical Therapy Treatment Patient Details Name: Matthew [name] MRN: [id number] DOB: 1950-10-25 Today's Date: 05/26/2020    History of Present Illness 69 year old male known history of asthma HTN, depression, chronic pain on buprenorphine, CKD stage IIILast admitted to the hospital 10/2019 with acute asthma exacerbationPresented from home 05/18/2020 with fall leg gave way, no LOC although hit head x-ray distal left femur showed fracture. S/P L femur ORIF    PT Comments    Pt presents supine in bed, c/o shortness of breath but agreeable to therapy session and with good participation and fair tolerance for mobility. Pt mainly limited due to decreased activity tolerance and increased work of breathing throughout session. Pt performed bed mobility with heavy use of bed features and up to minA and needed frequent rest breaks with cues for PLB. Pt O2 desat (see below) during session and needed up to 4L O2 [place] to remain >90% during mobility tasks (seated lateral scooting, RN informed and OK with O2 increased, pt remained on 3L at end of session). Pt occasionally needing cues for TDWB LLE to maintain compliance. Pt continues to benefit from PT services to progress toward functional mobility goals. Continue to recommend SNF level of rehab.   Follow Up Recommendations  SNF     Equipment Recommendations  Other (comment);None recommended by PT    Recommendations for Other Services       Precautions / Restrictions Precautions Precautions: Fall Precaution Comments: Monitor O2 Restrictions Weight Bearing Restrictions: Yes LLE Weight Bearing: Touchdown weight bearing    Mobility  Bed Mobility Overal bed mobility: Needs Assistance Bed Mobility: Supine to Sit;Sit to Supine     Supine to sit: Min guard;HOB elevated Sit to supine: Min assist   General bed mobility comments: Min guard for safety of L LE to sit EOB, Mod A for return to bed with assistance needed for B LE  advancement  Transfers Overall transfer level: Needs assistance   Transfers: Lateral/Scoot Transfers          Lateral/Scoot Transfers: Min assist General transfer comment: min assist for lateral scooting toward [facility] with transfer pad assist  Ambulation/Gait                 Stairs             Wheelchair Mobility    Modified Rankin (Stroke Patients Only)       Balance Overall balance assessment: Needs assistance Sitting-balance support: Bilateral upper extremity supported;Feet supported Sitting balance-Leahy Scale: Fair Sitting balance - Comments: pt able to sit unsupported briefly while static sitting in bed, however frequently with BUE support and posterior lean 2/2 LLE pain and shortness of breath Postural control: Posterior lean                                  Cognition Arousal/Alertness: Awake/alert Behavior During Therapy: WFL for tasks assessed/performed Overall Cognitive Status: Within Functional Limits for tasks assessed                                        Exercises Total Joint Exercises Ankle Circles/Pumps: AROM;Both;10 reps;Supine Quad Sets: AROM;Both;10 reps;Supine General Exercises - Lower Extremity Heel Slides: AROM;Left;10 reps;Supine    General Comments General comments (skin integrity, edema, etc.): Pt received on 2 L O2 [place]. Pt desat to 76% on 2L O2 [place] while  scooting EOB, pt given cues for PLB and seated rest break, O2 increased to 3L O2 [place] but O2 sats only increased to 84%, then raised to 4L per RN approval, and SpO2 improved to 91%. Pt stayed on 4L during seated scooting, then brought down to 3L once resting/supine and SpO2 ~90% at end of session, OT entering room. Pt was noted with 3/4 DOE during activities, endorses being a mouth breather      Pertinent Vitals/Pain Pain Assessment: Faces Faces Pain Scale: Hurts even more Pain Location: L knee, R shin Pain Descriptors / Indicators:  Discomfort;Sore Pain Intervention(s): Limited activity within patient's tolerance;Monitored during session;Repositioned;Ice applied;RN gave pain meds during session    Home Living                      Prior Function            PT Goals (current goals can now be found in the care plan section) Acute Rehab PT Goals Patient Stated Goal: get stronger, have exercise handout  PT Goal Formulation: With patient Time For Goal Achievement: 06/03/20 Potential to Achieve Goals: Fair Progress towards PT goals: Progressing toward goals    Frequency    Min 3X/week      PT Plan Current plan remains appropriate    Co-evaluation              AM-PAC PT "6 Clicks" Mobility   Outcome Measure  Help needed turning from your back to your side while in a flat bed without using bedrails?: A Little Help needed moving from lying on your back to sitting on the side of a flat bed without using bedrails?: A Little Help needed moving to and from a bed to a chair (including a wheelchair)?: A Lot Help needed standing up from a chair using your arms (e.g., wheelchair or bedside chair)?: Total Help needed to walk in hospital room?: Total   6 Click Score: 10    End of Session Equipment Utilized During Treatment: Oxygen Activity Tolerance: Patient tolerated treatment well;Other (comment) (SOB) Patient left: in bed;with bed alarm set;Other (comment) (ice to B knees/R shin) Nurse Communication: Mobility status;Weight bearing status;Precautions PT Visit Diagnosis: Muscle weakness (generalized) (M62.81);Difficulty in walking, not elsewhere classified (R26.2)     Time: 1607-3710 PT Time Calculation (min) (ACUTE ONLY): 25 min  Charges:  $Therapeutic Exercise: 8-22 mins $Therapeutic Activity: 8-22 mins                     Kaida Games P., PTA Acute Rehabilitation Services Pager: [phone] Office: [place] 05/26/2020, 1:48 PM

## 2020-05-27 ENCOUNTER — Other Ambulatory Visit: Payer: Self-pay | Admitting: Internal Medicine

## 2020-05-27 DIAGNOSIS — I10 Essential (primary) hypertension: Secondary | ICD-10-CM | POA: Diagnosis not present

## 2020-05-27 DIAGNOSIS — D649 Anemia, unspecified: Secondary | ICD-10-CM | POA: Diagnosis not present

## 2020-05-27 DIAGNOSIS — S7292XD Unspecified fracture of left femur, subsequent encounter for closed fracture with routine healing: Secondary | ICD-10-CM | POA: Diagnosis not present

## 2020-05-27 DIAGNOSIS — S72352D Displaced comminuted fracture of shaft of left femur, subsequent encounter for closed fracture with routine healing: Secondary | ICD-10-CM | POA: Diagnosis not present

## 2020-05-27 DIAGNOSIS — W1830XA Fall on same level, unspecified, initial encounter: Secondary | ICD-10-CM | POA: Diagnosis not present

## 2020-05-27 DIAGNOSIS — W19XXXA Unspecified fall, initial encounter: Secondary | ICD-10-CM | POA: Diagnosis not present

## 2020-05-27 DIAGNOSIS — J45909 Unspecified asthma, uncomplicated: Secondary | ICD-10-CM | POA: Diagnosis not present

## 2020-05-27 DIAGNOSIS — Z23 Encounter for immunization: Secondary | ICD-10-CM | POA: Diagnosis not present

## 2020-05-27 DIAGNOSIS — G894 Chronic pain syndrome: Secondary | ICD-10-CM | POA: Diagnosis not present

## 2020-05-27 DIAGNOSIS — Z7401 Bed confinement status: Secondary | ICD-10-CM | POA: Diagnosis not present

## 2020-05-27 DIAGNOSIS — J45901 Unspecified asthma with (acute) exacerbation: Secondary | ICD-10-CM | POA: Diagnosis not present

## 2020-05-27 DIAGNOSIS — G8929 Other chronic pain: Secondary | ICD-10-CM | POA: Diagnosis not present

## 2020-05-27 DIAGNOSIS — Y92009 Unspecified place in unspecified non-institutional (private) residence as the place of occurrence of the external cause: Secondary | ICD-10-CM | POA: Diagnosis not present

## 2020-05-27 DIAGNOSIS — M2550 Pain in unspecified joint: Secondary | ICD-10-CM | POA: Diagnosis not present

## 2020-05-27 DIAGNOSIS — Z029 Encounter for administrative examinations, unspecified: Secondary | ICD-10-CM | POA: Diagnosis not present

## 2020-05-27 DIAGNOSIS — F32A Depression, unspecified: Secondary | ICD-10-CM | POA: Diagnosis not present

## 2020-05-27 DIAGNOSIS — F339 Major depressive disorder, recurrent, unspecified: Secondary | ICD-10-CM | POA: Diagnosis not present

## 2020-05-27 DIAGNOSIS — R0902 Hypoxemia: Secondary | ICD-10-CM | POA: Diagnosis not present

## 2020-05-27 DIAGNOSIS — R52 Pain, unspecified: Secondary | ICD-10-CM | POA: Diagnosis not present

## 2020-05-27 DIAGNOSIS — S72492A Other fracture of lower end of left femur, initial encounter for closed fracture: Secondary | ICD-10-CM | POA: Diagnosis not present

## 2020-05-27 DIAGNOSIS — S72492D Other fracture of lower end of left femur, subsequent encounter for closed fracture with routine healing: Secondary | ICD-10-CM | POA: Diagnosis not present

## 2020-05-27 MED ORDER — GUAIFENESIN 100 MG/5ML PO SOLN: 5.0000 mL | ORAL | Status: DC | PRN

## 2020-05-27 NOTE — Progress Notes (Signed)
Pt discharging to SNF, [facility].  Copy of instructions printed and sent with transporters for facility.  Report called to staff nurse at [facility] at 1405.   [facility] picked pt up at 1405 . Pt d/c'd with belongings, transported by [facility].

## 2020-05-27 NOTE — TOC Transition Note (Signed)
Transition of Care [facility]) - CM/SW Discharge Note   Patient Details  Name: MCLEAN MOYA MRN: [id number] Date of Birth: 1951/04/01  [facility] CM/SW Contact:  Sharin Mons, RN Phone Number: [phone] 05/27/2020, 11:38 AM   Clinical Narrative:    Patient will DC to: [facility] Anticipated DC date:05/27/2020 Family notified: yes, wife Transport by: [facility] admissions made NCM  Aware authorization received for SNF placement.  Per MD patient ready for DC today. RN, patient, patient's family, and facility notified of DC. Discharge Summary and FL2 sent to facility. RN to call report prior to discharge [phone]). Rm# [id number]. DC packet on chart. Ambulance transport requested for patient.   RNCM will sign off for now as intervention is no longer needed. Please consult [place] again if new needs arise.     Tomer Chalmers (Spouse)      [phone]        Final next level of care: Skilled Nursing Facility Barriers to Discharge: No Barriers Identified   Patient Goals and CMS Choice Patient states their goals for this hospitalization and ongoing recovery are:: To get better CMS Medicare.gov Compare Post Acute Care list provided to:: Patient Choice offered to / list presented to : Patient  Discharge Placement                       Discharge Plan and Services                                     Social Determinants of Health (SDOH) Interventions     Readmission Risk Interventions No flowsheet data found.
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
## 2020-05-27 NOTE — Care Management Important Message (Signed)
Important Message  Patient Details  Name: Matthew [name] MRN: [id number] Date of Birth: 11/13/1950   Medicare Important Message Given:  Yes - Important Message mailed due to current National Emergency  Verbal consent obtained due to current National Emergency  Relationship to patient: Self Contact Name: Dekota Shenk Call Date: 05/27/20  Time: 1139 Phone: [phone] Outcome: Spoke with contact Important Message mailed to: Other (must enter comment) (patient declined additional copy of IM)    [name] P Brenly Trawick 05/27/2020, 11:40 AM

## 2020-05-27 NOTE — Plan of Care (Signed)
Pt discharging to SNF for further rehab/PT/OT before going home.

## 2020-05-30 DIAGNOSIS — S7292XD Unspecified fracture of left femur, subsequent encounter for closed fracture with routine healing: Secondary | ICD-10-CM | POA: Diagnosis not present

## 2020-05-30 DIAGNOSIS — F32A Depression, unspecified: Secondary | ICD-10-CM | POA: Diagnosis not present

## 2020-05-30 DIAGNOSIS — J45901 Unspecified asthma with (acute) exacerbation: Secondary | ICD-10-CM | POA: Diagnosis not present

## 2020-05-30 DIAGNOSIS — G8929 Other chronic pain: Secondary | ICD-10-CM | POA: Diagnosis not present

## 2020-05-30 DIAGNOSIS — I10 Essential (primary) hypertension: Secondary | ICD-10-CM | POA: Diagnosis not present

## 2020-06-14 DIAGNOSIS — J45909 Unspecified asthma, uncomplicated: Secondary | ICD-10-CM | POA: Diagnosis not present

## 2020-06-14 DIAGNOSIS — Z029 Encounter for administrative examinations, unspecified: Secondary | ICD-10-CM | POA: Diagnosis not present

## 2020-06-14 DIAGNOSIS — G894 Chronic pain syndrome: Secondary | ICD-10-CM | POA: Diagnosis not present

## 2020-06-14 DIAGNOSIS — F339 Major depressive disorder, recurrent, unspecified: Secondary | ICD-10-CM | POA: Diagnosis not present

## 2020-06-14 DIAGNOSIS — I10 Essential (primary) hypertension: Secondary | ICD-10-CM | POA: Diagnosis not present

## 2020-06-15 DIAGNOSIS — S72492D Other fracture of lower end of left femur, subsequent encounter for closed fracture with routine healing: Secondary | ICD-10-CM | POA: Diagnosis not present

## 2020-06-15 DIAGNOSIS — S72352D Displaced comminuted fracture of shaft of left femur, subsequent encounter for closed fracture with routine healing: Secondary | ICD-10-CM | POA: Diagnosis not present

## 2020-06-16 ENCOUNTER — Other Ambulatory Visit: Payer: Self-pay

## 2020-06-16 NOTE — Patient Outreach (Signed)
[facility]) Care Management  06/16/2020  Matthew [name] September 06, 1950 [id number]     Transition of Care Referral  Referral Date: 06/16/2020 Referral Source: [facility] Discharge Report Date of Discharge: 06/15/2020 Facility: [facility] Insurance: [facility]     Referral received. Transition of care calls being completed via EMMI-automated calls. RN CM will outreach patient for any red flags received.    Plan: RN CM will close case at this time.   Enzo Montgomery, RN,BSN,CCM [facility] Management Telephonic Care Management Coordinator Direct Phone: [phone] Toll Free: [phone] Fax: [phone]

## 2020-06-26 DIAGNOSIS — G4733 Obstructive sleep apnea (adult) (pediatric): Secondary | ICD-10-CM | POA: Diagnosis not present

## 2020-06-26 DIAGNOSIS — G894 Chronic pain syndrome: Secondary | ICD-10-CM | POA: Diagnosis not present

## 2020-06-26 DIAGNOSIS — N1831 Chronic kidney disease, stage 3a: Secondary | ICD-10-CM | POA: Diagnosis not present

## 2020-06-26 DIAGNOSIS — E669 Obesity, unspecified: Secondary | ICD-10-CM | POA: Diagnosis not present

## 2020-06-26 DIAGNOSIS — I129 Hypertensive chronic kidney disease with stage 1 through stage 4 chronic kidney disease, or unspecified chronic kidney disease: Secondary | ICD-10-CM | POA: Diagnosis not present

## 2020-06-26 DIAGNOSIS — J45901 Unspecified asthma with (acute) exacerbation: Secondary | ICD-10-CM | POA: Diagnosis not present

## 2020-06-26 DIAGNOSIS — D631 Anemia in chronic kidney disease: Secondary | ICD-10-CM | POA: Diagnosis not present

## 2020-06-26 DIAGNOSIS — F32A Depression, unspecified: Secondary | ICD-10-CM | POA: Diagnosis not present

## 2020-06-26 DIAGNOSIS — S72402D Unspecified fracture of lower end of left femur, subsequent encounter for closed fracture with routine healing: Secondary | ICD-10-CM | POA: Diagnosis not present

## 2020-06-28 DIAGNOSIS — E669 Obesity, unspecified: Secondary | ICD-10-CM | POA: Diagnosis not present

## 2020-06-28 DIAGNOSIS — J45901 Unspecified asthma with (acute) exacerbation: Secondary | ICD-10-CM | POA: Diagnosis not present

## 2020-06-28 DIAGNOSIS — F32A Depression, unspecified: Secondary | ICD-10-CM | POA: Diagnosis not present

## 2020-06-28 DIAGNOSIS — D631 Anemia in chronic kidney disease: Secondary | ICD-10-CM | POA: Diagnosis not present

## 2020-06-28 DIAGNOSIS — N1831 Chronic kidney disease, stage 3a: Secondary | ICD-10-CM | POA: Diagnosis not present

## 2020-06-28 DIAGNOSIS — G4733 Obstructive sleep apnea (adult) (pediatric): Secondary | ICD-10-CM | POA: Diagnosis not present

## 2020-06-28 DIAGNOSIS — G894 Chronic pain syndrome: Secondary | ICD-10-CM | POA: Diagnosis not present

## 2020-06-28 DIAGNOSIS — S72402D Unspecified fracture of lower end of left femur, subsequent encounter for closed fracture with routine healing: Secondary | ICD-10-CM | POA: Diagnosis not present

## 2020-06-28 DIAGNOSIS — I129 Hypertensive chronic kidney disease with stage 1 through stage 4 chronic kidney disease, or unspecified chronic kidney disease: Secondary | ICD-10-CM | POA: Diagnosis not present

## 2020-06-29 DIAGNOSIS — N1831 Chronic kidney disease, stage 3a: Secondary | ICD-10-CM | POA: Diagnosis not present

## 2020-06-29 DIAGNOSIS — D631 Anemia in chronic kidney disease: Secondary | ICD-10-CM | POA: Diagnosis not present

## 2020-06-29 DIAGNOSIS — G5600 Carpal tunnel syndrome, unspecified upper limb: Secondary | ICD-10-CM | POA: Diagnosis not present

## 2020-06-29 DIAGNOSIS — G4733 Obstructive sleep apnea (adult) (pediatric): Secondary | ICD-10-CM | POA: Diagnosis not present

## 2020-06-29 DIAGNOSIS — Z79899 Other long term (current) drug therapy: Secondary | ICD-10-CM | POA: Diagnosis not present

## 2020-06-29 DIAGNOSIS — M79609 Pain in unspecified limb: Secondary | ICD-10-CM | POA: Diagnosis not present

## 2020-06-29 DIAGNOSIS — G894 Chronic pain syndrome: Secondary | ICD-10-CM | POA: Diagnosis not present

## 2020-06-29 DIAGNOSIS — M170 Bilateral primary osteoarthritis of knee: Secondary | ICD-10-CM | POA: Diagnosis not present

## 2020-06-29 DIAGNOSIS — M545 Low back pain, unspecified: Secondary | ICD-10-CM | POA: Diagnosis not present

## 2020-06-29 DIAGNOSIS — I129 Hypertensive chronic kidney disease with stage 1 through stage 4 chronic kidney disease, or unspecified chronic kidney disease: Secondary | ICD-10-CM | POA: Diagnosis not present

## 2020-06-29 DIAGNOSIS — J45901 Unspecified asthma with (acute) exacerbation: Secondary | ICD-10-CM | POA: Diagnosis not present

## 2020-06-29 DIAGNOSIS — E669 Obesity, unspecified: Secondary | ICD-10-CM | POA: Diagnosis not present

## 2020-06-29 DIAGNOSIS — S72402D Unspecified fracture of lower end of left femur, subsequent encounter for closed fracture with routine healing: Secondary | ICD-10-CM | POA: Diagnosis not present

## 2020-06-29 DIAGNOSIS — M79605 Pain in left leg: Secondary | ICD-10-CM | POA: Diagnosis not present

## 2020-06-29 DIAGNOSIS — F32A Depression, unspecified: Secondary | ICD-10-CM | POA: Diagnosis not present

## 2020-06-29 DIAGNOSIS — G4700 Insomnia, unspecified: Secondary | ICD-10-CM | POA: Diagnosis not present

## 2020-06-29 DIAGNOSIS — M5417 Radiculopathy, lumbosacral region: Secondary | ICD-10-CM | POA: Diagnosis not present

## 2020-07-01 DIAGNOSIS — I129 Hypertensive chronic kidney disease with stage 1 through stage 4 chronic kidney disease, or unspecified chronic kidney disease: Secondary | ICD-10-CM | POA: Diagnosis not present

## 2020-07-01 DIAGNOSIS — D631 Anemia in chronic kidney disease: Secondary | ICD-10-CM | POA: Diagnosis not present

## 2020-07-01 DIAGNOSIS — E669 Obesity, unspecified: Secondary | ICD-10-CM | POA: Diagnosis not present

## 2020-07-01 DIAGNOSIS — N1831 Chronic kidney disease, stage 3a: Secondary | ICD-10-CM | POA: Diagnosis not present

## 2020-07-01 DIAGNOSIS — J45901 Unspecified asthma with (acute) exacerbation: Secondary | ICD-10-CM | POA: Diagnosis not present

## 2020-07-01 DIAGNOSIS — G4733 Obstructive sleep apnea (adult) (pediatric): Secondary | ICD-10-CM | POA: Diagnosis not present

## 2020-07-01 DIAGNOSIS — F32A Depression, unspecified: Secondary | ICD-10-CM | POA: Diagnosis not present

## 2020-07-01 DIAGNOSIS — S72402D Unspecified fracture of lower end of left femur, subsequent encounter for closed fracture with routine healing: Secondary | ICD-10-CM | POA: Diagnosis not present

## 2020-07-01 DIAGNOSIS — G894 Chronic pain syndrome: Secondary | ICD-10-CM | POA: Diagnosis not present

## 2020-07-05 DIAGNOSIS — J45901 Unspecified asthma with (acute) exacerbation: Secondary | ICD-10-CM | POA: Diagnosis not present

## 2020-07-05 DIAGNOSIS — S72402D Unspecified fracture of lower end of left femur, subsequent encounter for closed fracture with routine healing: Secondary | ICD-10-CM | POA: Diagnosis not present

## 2020-07-05 DIAGNOSIS — E669 Obesity, unspecified: Secondary | ICD-10-CM | POA: Diagnosis not present

## 2020-07-05 DIAGNOSIS — N1831 Chronic kidney disease, stage 3a: Secondary | ICD-10-CM | POA: Diagnosis not present

## 2020-07-05 DIAGNOSIS — F32A Depression, unspecified: Secondary | ICD-10-CM | POA: Diagnosis not present

## 2020-07-05 DIAGNOSIS — G894 Chronic pain syndrome: Secondary | ICD-10-CM | POA: Diagnosis not present

## 2020-07-05 DIAGNOSIS — I129 Hypertensive chronic kidney disease with stage 1 through stage 4 chronic kidney disease, or unspecified chronic kidney disease: Secondary | ICD-10-CM | POA: Diagnosis not present

## 2020-07-05 DIAGNOSIS — G4733 Obstructive sleep apnea (adult) (pediatric): Secondary | ICD-10-CM | POA: Diagnosis not present

## 2020-07-05 DIAGNOSIS — D631 Anemia in chronic kidney disease: Secondary | ICD-10-CM | POA: Diagnosis not present

## 2020-07-06 DIAGNOSIS — S72402D Unspecified fracture of lower end of left femur, subsequent encounter for closed fracture with routine healing: Secondary | ICD-10-CM | POA: Diagnosis not present

## 2020-07-06 DIAGNOSIS — G894 Chronic pain syndrome: Secondary | ICD-10-CM | POA: Diagnosis not present

## 2020-07-06 DIAGNOSIS — G4733 Obstructive sleep apnea (adult) (pediatric): Secondary | ICD-10-CM | POA: Diagnosis not present

## 2020-07-06 DIAGNOSIS — I129 Hypertensive chronic kidney disease with stage 1 through stage 4 chronic kidney disease, or unspecified chronic kidney disease: Secondary | ICD-10-CM | POA: Diagnosis not present

## 2020-07-06 DIAGNOSIS — N1831 Chronic kidney disease, stage 3a: Secondary | ICD-10-CM | POA: Diagnosis not present

## 2020-07-06 DIAGNOSIS — J45901 Unspecified asthma with (acute) exacerbation: Secondary | ICD-10-CM | POA: Diagnosis not present

## 2020-07-06 DIAGNOSIS — E669 Obesity, unspecified: Secondary | ICD-10-CM | POA: Diagnosis not present

## 2020-07-06 DIAGNOSIS — F32A Depression, unspecified: Secondary | ICD-10-CM | POA: Diagnosis not present

## 2020-07-06 DIAGNOSIS — D631 Anemia in chronic kidney disease: Secondary | ICD-10-CM | POA: Diagnosis not present

## 2020-07-09 DIAGNOSIS — E669 Obesity, unspecified: Secondary | ICD-10-CM | POA: Diagnosis not present

## 2020-07-09 DIAGNOSIS — F32A Depression, unspecified: Secondary | ICD-10-CM | POA: Diagnosis not present

## 2020-07-09 DIAGNOSIS — I129 Hypertensive chronic kidney disease with stage 1 through stage 4 chronic kidney disease, or unspecified chronic kidney disease: Secondary | ICD-10-CM | POA: Diagnosis not present

## 2020-07-09 DIAGNOSIS — D631 Anemia in chronic kidney disease: Secondary | ICD-10-CM | POA: Diagnosis not present

## 2020-07-09 DIAGNOSIS — J45901 Unspecified asthma with (acute) exacerbation: Secondary | ICD-10-CM | POA: Diagnosis not present

## 2020-07-09 DIAGNOSIS — G894 Chronic pain syndrome: Secondary | ICD-10-CM | POA: Diagnosis not present

## 2020-07-09 DIAGNOSIS — G4733 Obstructive sleep apnea (adult) (pediatric): Secondary | ICD-10-CM | POA: Diagnosis not present

## 2020-07-09 DIAGNOSIS — N1831 Chronic kidney disease, stage 3a: Secondary | ICD-10-CM | POA: Diagnosis not present

## 2020-07-09 DIAGNOSIS — S72402D Unspecified fracture of lower end of left femur, subsequent encounter for closed fracture with routine healing: Secondary | ICD-10-CM | POA: Diagnosis not present

## 2020-07-13 DIAGNOSIS — J45901 Unspecified asthma with (acute) exacerbation: Secondary | ICD-10-CM | POA: Diagnosis not present

## 2020-07-13 DIAGNOSIS — F32A Depression, unspecified: Secondary | ICD-10-CM | POA: Diagnosis not present

## 2020-07-13 DIAGNOSIS — S72402D Unspecified fracture of lower end of left femur, subsequent encounter for closed fracture with routine healing: Secondary | ICD-10-CM | POA: Diagnosis not present

## 2020-07-13 DIAGNOSIS — G4733 Obstructive sleep apnea (adult) (pediatric): Secondary | ICD-10-CM | POA: Diagnosis not present

## 2020-07-13 DIAGNOSIS — N1831 Chronic kidney disease, stage 3a: Secondary | ICD-10-CM | POA: Diagnosis not present

## 2020-07-13 DIAGNOSIS — G894 Chronic pain syndrome: Secondary | ICD-10-CM | POA: Diagnosis not present

## 2020-07-13 DIAGNOSIS — E669 Obesity, unspecified: Secondary | ICD-10-CM | POA: Diagnosis not present

## 2020-07-13 DIAGNOSIS — I129 Hypertensive chronic kidney disease with stage 1 through stage 4 chronic kidney disease, or unspecified chronic kidney disease: Secondary | ICD-10-CM | POA: Diagnosis not present

## 2020-07-13 DIAGNOSIS — D631 Anemia in chronic kidney disease: Secondary | ICD-10-CM | POA: Diagnosis not present

## 2020-07-14 DIAGNOSIS — D631 Anemia in chronic kidney disease: Secondary | ICD-10-CM | POA: Diagnosis not present

## 2020-07-14 DIAGNOSIS — G894 Chronic pain syndrome: Secondary | ICD-10-CM | POA: Diagnosis not present

## 2020-07-14 DIAGNOSIS — G4733 Obstructive sleep apnea (adult) (pediatric): Secondary | ICD-10-CM | POA: Diagnosis not present

## 2020-07-14 DIAGNOSIS — N1831 Chronic kidney disease, stage 3a: Secondary | ICD-10-CM | POA: Diagnosis not present

## 2020-07-14 DIAGNOSIS — F32A Depression, unspecified: Secondary | ICD-10-CM | POA: Diagnosis not present

## 2020-07-14 DIAGNOSIS — J45901 Unspecified asthma with (acute) exacerbation: Secondary | ICD-10-CM | POA: Diagnosis not present

## 2020-07-14 DIAGNOSIS — E669 Obesity, unspecified: Secondary | ICD-10-CM | POA: Diagnosis not present

## 2020-07-14 DIAGNOSIS — I129 Hypertensive chronic kidney disease with stage 1 through stage 4 chronic kidney disease, or unspecified chronic kidney disease: Secondary | ICD-10-CM | POA: Diagnosis not present

## 2020-07-14 DIAGNOSIS — S72402D Unspecified fracture of lower end of left femur, subsequent encounter for closed fracture with routine healing: Secondary | ICD-10-CM | POA: Diagnosis not present

## 2020-07-21 DIAGNOSIS — D631 Anemia in chronic kidney disease: Secondary | ICD-10-CM | POA: Diagnosis not present

## 2020-07-21 DIAGNOSIS — J45901 Unspecified asthma with (acute) exacerbation: Secondary | ICD-10-CM | POA: Diagnosis not present

## 2020-07-21 DIAGNOSIS — G4733 Obstructive sleep apnea (adult) (pediatric): Secondary | ICD-10-CM | POA: Diagnosis not present

## 2020-07-21 DIAGNOSIS — E669 Obesity, unspecified: Secondary | ICD-10-CM | POA: Diagnosis not present

## 2020-07-21 DIAGNOSIS — N1831 Chronic kidney disease, stage 3a: Secondary | ICD-10-CM | POA: Diagnosis not present

## 2020-07-21 DIAGNOSIS — S72402D Unspecified fracture of lower end of left femur, subsequent encounter for closed fracture with routine healing: Secondary | ICD-10-CM | POA: Diagnosis not present

## 2020-07-21 DIAGNOSIS — F32A Depression, unspecified: Secondary | ICD-10-CM | POA: Diagnosis not present

## 2020-07-21 DIAGNOSIS — I129 Hypertensive chronic kidney disease with stage 1 through stage 4 chronic kidney disease, or unspecified chronic kidney disease: Secondary | ICD-10-CM | POA: Diagnosis not present

## 2020-07-21 DIAGNOSIS — G894 Chronic pain syndrome: Secondary | ICD-10-CM | POA: Diagnosis not present

## 2020-08-13 ENCOUNTER — Emergency Department: Payer: Medicare HMO

## 2020-08-13 ENCOUNTER — Emergency Department
Admission: EM | Admit: 2020-08-13 | Discharge: 2020-08-14 | Disposition: A | Discharge status: Home / Self Care | Payer: Medicare HMO | Attending: Emergency Medicine | Admitting: Emergency Medicine

## 2020-08-13 ENCOUNTER — Encounter: Payer: Self-pay | Admitting: Emergency Medicine

## 2020-08-13 ENCOUNTER — Other Ambulatory Visit: Payer: Self-pay

## 2020-08-13 DIAGNOSIS — G4489 Other headache syndrome: Secondary | ICD-10-CM | POA: Diagnosis not present

## 2020-08-13 DIAGNOSIS — R531 Weakness: Secondary | ICD-10-CM | POA: Diagnosis not present

## 2020-08-13 DIAGNOSIS — Z96643 Presence of artificial hip joint, bilateral: Secondary | ICD-10-CM | POA: Diagnosis not present

## 2020-08-13 DIAGNOSIS — R42 Dizziness and giddiness: Secondary | ICD-10-CM | POA: Insufficient documentation

## 2020-08-13 DIAGNOSIS — R61 Generalized hyperhidrosis: Secondary | ICD-10-CM | POA: Diagnosis not present

## 2020-08-13 DIAGNOSIS — D171 Benign lipomatous neoplasm of skin and subcutaneous tissue of trunk: Secondary | ICD-10-CM | POA: Diagnosis not present

## 2020-08-13 DIAGNOSIS — R55 Syncope and collapse: Secondary | ICD-10-CM | POA: Insufficient documentation

## 2020-08-13 DIAGNOSIS — E860 Dehydration: Secondary | ICD-10-CM | POA: Insufficient documentation

## 2020-08-13 DIAGNOSIS — D1779 Benign lipomatous neoplasm of other sites: Secondary | ICD-10-CM | POA: Diagnosis not present

## 2020-08-13 DIAGNOSIS — T50905A Adverse effect of unspecified drugs, medicaments and biological substances, initial encounter: Secondary | ICD-10-CM

## 2020-08-13 DIAGNOSIS — R109 Unspecified abdominal pain: Secondary | ICD-10-CM | POA: Insufficient documentation

## 2020-08-13 DIAGNOSIS — I2510 Atherosclerotic heart disease of native coronary artery without angina pectoris: Secondary | ICD-10-CM | POA: Diagnosis not present

## 2020-08-13 DIAGNOSIS — T50901A Poisoning by unspecified drugs, medicaments and biological substances, accidental (unintentional), initial encounter: Secondary | ICD-10-CM | POA: Insufficient documentation

## 2020-08-13 DIAGNOSIS — I10 Essential (primary) hypertension: Secondary | ICD-10-CM | POA: Insufficient documentation

## 2020-08-13 DIAGNOSIS — K6389 Other specified diseases of intestine: Secondary | ICD-10-CM | POA: Diagnosis not present

## 2020-08-13 DIAGNOSIS — J45901 Unspecified asthma with (acute) exacerbation: Secondary | ICD-10-CM | POA: Insufficient documentation

## 2020-08-13 DIAGNOSIS — N200 Calculus of kidney: Secondary | ICD-10-CM | POA: Diagnosis not present

## 2020-08-13 DIAGNOSIS — Z79899 Other long term (current) drug therapy: Secondary | ICD-10-CM | POA: Insufficient documentation

## 2020-08-13 DIAGNOSIS — I959 Hypotension, unspecified: Secondary | ICD-10-CM | POA: Diagnosis not present

## 2020-08-13 LAB — COMPREHENSIVE METABOLIC PANEL
ALT: 8 U/L (ref 0–44)
AST: 14 U/L — ABNORMAL LOW (ref 15–41)
Albumin: 2.9 g/dL — ABNORMAL LOW (ref 3.5–5.0)
Alkaline Phosphatase: 94 U/L (ref 38–126)
Anion gap: 8 (ref 5–15)
BUN: 18 mg/dL (ref 8–23)
CO2: 24 mmol/L (ref 22–32)
Calcium: 9.2 mg/dL (ref 8.9–10.3)
Chloride: 106 mmol/L (ref 98–111)
Creatinine, Ser: 1.52 mg/dL — ABNORMAL HIGH (ref 0.61–1.24)
GFR, Estimated: 49 mL/min — ABNORMAL LOW (ref >60)
Glucose, Bld: 172 mg/dL — ABNORMAL HIGH (ref 70–99)
Potassium: 3.8 mmol/L (ref 3.5–5.1)
Sodium: 138 mmol/L (ref 135–145)
Total Bilirubin: 0.8 mg/dL (ref 0.3–1.2)
Total Protein: 5.9 g/dL — ABNORMAL LOW (ref 6.5–8.1)

## 2020-08-13 LAB — CBC WITH DIFFERENTIAL/PLATELET
Abs Immature Granulocytes: 0.03 10*3/uL (ref 0.00–0.07)
Basophils Absolute: 0 10*3/uL (ref 0.0–0.1)
Basophils Relative: 1 %
Eosinophils Absolute: 0.2 10*3/uL (ref 0.0–0.5)
Eosinophils Relative: 2 %
HCT: 35.5 % — ABNORMAL LOW (ref 39.0–52.0)
Hemoglobin: 11.1 g/dL — ABNORMAL LOW (ref 13.0–17.0)
Immature Granulocytes: 0 %
Lymphocytes Relative: 14 %
Lymphs Abs: 1.2 10*3/uL (ref 0.7–4.0)
MCH: 31.6 pg (ref 26.0–34.0)
MCHC: 31.3 g/dL (ref 30.0–36.0)
MCV: 101.1 fL — ABNORMAL HIGH (ref 80.0–100.0)
Monocytes Absolute: 0.6 10*3/uL (ref 0.1–1.0)
Monocytes Relative: 6 %
Neutro Abs: 6.8 10*3/uL (ref 1.7–7.7)
Neutrophils Relative %: 77 %
Platelets: 233 10*3/uL (ref 150–400)
RBC: 3.51 MIL/uL — ABNORMAL LOW (ref 4.22–5.81)
RDW: 13.9 % (ref 11.5–15.5)
WBC: 8.9 10*3/uL (ref 4.0–10.5)
nRBC: 0 % (ref 0.0–0.2)

## 2020-08-13 LAB — TROPONIN I (HIGH SENSITIVITY)
Troponin I (High Sensitivity): 4 ng/L (ref <18)
Troponin I (High Sensitivity): 4 ng/L (ref <18)

## 2020-08-13 LAB — MAGNESIUM: Magnesium: 1.8 mg/dL (ref 1.7–2.4)

## 2020-08-13 MED ORDER — LACTATED RINGERS IV BOLUS
1000.0000 mL | Freq: Once | INTRAVENOUS | Status: AC
  Administered 2020-08-13: 1000 mL via INTRAVENOUS

## 2020-08-13 MED ORDER — IOHEXOL 350 MG/ML SOLN
100.0000 mL | Freq: Once | INTRAVENOUS | Status: AC | PRN
  Administered 2020-08-13: 100 mL via INTRAVENOUS

## 2020-08-13 NOTE — ED Notes (Signed)
Late entry -- Pt has returned to room from CT
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
## 2020-08-13 NOTE — Discharge Instructions (Addendum)
As we discussed, I would recommend stopping tizanidine.  If you do feel that this helps taking it, I would cut the dose in half and take it only once a day.  Drink at least 6 to 8 glasses of water over the next several days.  Do not take your pain medication and tizanidine within 2 hours of each other.
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
## 2020-08-13 NOTE — ED Notes (Signed)
Pt agreeable with d/c plan as discussed by provider- this nurse has verbally reinforced d/c instructions and provided pt with written copy - pt acknowledges verbal understanding and denies any additional questions concerns needs.

## 2020-08-13 NOTE — ED Notes (Signed)
Lab called and informed of Mag add-on.  Lab verbalized understanding of this information.
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
## 2020-08-13 NOTE — ED Notes (Signed)
ED Provider at bedside. 

## 2020-08-13 NOTE — ED Triage Notes (Signed)
Pt comes into the ED via [facility] from a grocery store where he had a syncopal episode.  Pt was found to be cool, diaphoretic, and pale in color.  Pt states he is almost positive he had LOC.  Pt states he did take a tizanidine about an hour prior to the episode and that's the first time in a while.  Pt currently neurologically intact at this time.  Pt received 966ml of NaCl.  Pt CBg 203, EKG showed SB, and patient is bradycardic at baseline. Pt also c/o central back pain that started when the event happened.  Pt currently in NAD at this time with even and unlabored respirations.

## 2020-08-13 NOTE — ED Notes (Signed)
Assumed care at this time.  Pt off the floor at shift change to CT (re: CTA chest/abd/pelvis)

## 2020-08-24 DIAGNOSIS — M25562 Pain in left knee: Secondary | ICD-10-CM | POA: Diagnosis not present

## 2020-10-04 DIAGNOSIS — M1711 Unilateral primary osteoarthritis, right knee: Secondary | ICD-10-CM | POA: Diagnosis not present

## 2020-10-04 DIAGNOSIS — M25562 Pain in left knee: Secondary | ICD-10-CM | POA: Diagnosis not present

## 2020-10-04 DIAGNOSIS — M79609 Pain in unspecified limb: Secondary | ICD-10-CM | POA: Diagnosis not present

## 2020-10-04 DIAGNOSIS — Z5181 Encounter for therapeutic drug level monitoring: Secondary | ICD-10-CM | POA: Diagnosis not present

## 2020-10-04 DIAGNOSIS — G894 Chronic pain syndrome: Secondary | ICD-10-CM | POA: Diagnosis not present

## 2020-10-04 DIAGNOSIS — M1712 Unilateral primary osteoarthritis, left knee: Secondary | ICD-10-CM | POA: Diagnosis not present

## 2020-10-04 DIAGNOSIS — Z79891 Long term (current) use of opiate analgesic: Secondary | ICD-10-CM | POA: Diagnosis not present

## 2020-10-04 DIAGNOSIS — Z79899 Other long term (current) drug therapy: Secondary | ICD-10-CM | POA: Diagnosis not present

## 2020-10-04 DIAGNOSIS — M170 Bilateral primary osteoarthritis of knee: Secondary | ICD-10-CM | POA: Diagnosis not present

## 2020-10-25 DIAGNOSIS — S728X2S Other fracture of left femur, sequela: Secondary | ICD-10-CM | POA: Diagnosis not present

## 2020-10-25 DIAGNOSIS — I10 Essential (primary) hypertension: Secondary | ICD-10-CM | POA: Diagnosis not present

## 2020-10-25 DIAGNOSIS — G894 Chronic pain syndrome: Secondary | ICD-10-CM | POA: Diagnosis not present

## 2020-11-05 ENCOUNTER — Emergency Department: Payer: Medicare HMO

## 2020-11-05 ENCOUNTER — Encounter: Payer: Self-pay | Admitting: Emergency Medicine

## 2020-11-05 ENCOUNTER — Other Ambulatory Visit: Payer: Self-pay

## 2020-11-05 ENCOUNTER — Emergency Department
Admission: EM | Admit: 2020-11-05 | Discharge: 2020-11-05 | Disposition: A | Discharge status: Home / Self Care | Payer: Medicare HMO | Attending: Emergency Medicine | Admitting: Emergency Medicine

## 2020-11-05 DIAGNOSIS — Z7951 Long term (current) use of inhaled steroids: Secondary | ICD-10-CM | POA: Insufficient documentation

## 2020-11-05 DIAGNOSIS — Z79899 Other long term (current) drug therapy: Secondary | ICD-10-CM | POA: Diagnosis not present

## 2020-11-05 DIAGNOSIS — Z96642 Presence of left artificial hip joint: Secondary | ICD-10-CM | POA: Diagnosis not present

## 2020-11-05 DIAGNOSIS — R457 State of emotional shock and stress, unspecified: Secondary | ICD-10-CM | POA: Diagnosis not present

## 2020-11-05 DIAGNOSIS — I10 Essential (primary) hypertension: Secondary | ICD-10-CM | POA: Diagnosis not present

## 2020-11-05 DIAGNOSIS — Z96641 Presence of right artificial hip joint: Secondary | ICD-10-CM | POA: Insufficient documentation

## 2020-11-05 DIAGNOSIS — J45901 Unspecified asthma with (acute) exacerbation: Secondary | ICD-10-CM | POA: Insufficient documentation

## 2020-11-05 DIAGNOSIS — R0602 Shortness of breath: Secondary | ICD-10-CM | POA: Diagnosis not present

## 2020-11-05 DIAGNOSIS — R0689 Other abnormalities of breathing: Secondary | ICD-10-CM | POA: Diagnosis not present

## 2020-11-05 LAB — BASIC METABOLIC PANEL
Anion gap: 6 (ref 5–15)
BUN: 21 mg/dL (ref 8–23)
CO2: 24 mmol/L (ref 22–32)
Calcium: 9.8 mg/dL (ref 8.9–10.3)
Chloride: 108 mmol/L (ref 98–111)
Creatinine, Ser: 1.41 mg/dL — ABNORMAL HIGH (ref 0.61–1.24)
GFR, Estimated: 54 mL/min — ABNORMAL LOW (ref >60)
Glucose, Bld: 111 mg/dL — ABNORMAL HIGH (ref 70–99)
Potassium: 4.3 mmol/L (ref 3.5–5.1)
Sodium: 138 mmol/L (ref 135–145)

## 2020-11-05 LAB — CBC WITH DIFFERENTIAL/PLATELET
Abs Immature Granulocytes: 0.02 10*3/uL (ref 0.00–0.07)
Basophils Absolute: 0 10*3/uL (ref 0.0–0.1)
Basophils Relative: 0 %
Eosinophils Absolute: 0.3 10*3/uL (ref 0.0–0.5)
Eosinophils Relative: 4 %
HCT: 43.1 % (ref 39.0–52.0)
Hemoglobin: 14.2 g/dL (ref 13.0–17.0)
Immature Granulocytes: 0 %
Lymphocytes Relative: 24 %
Lymphs Abs: 1.7 10*3/uL (ref 0.7–4.0)
MCH: 33.6 pg (ref 26.0–34.0)
MCHC: 32.9 g/dL (ref 30.0–36.0)
MCV: 101.9 fL — ABNORMAL HIGH (ref 80.0–100.0)
Monocytes Absolute: 0.8 10*3/uL (ref 0.1–1.0)
Monocytes Relative: 11 %
Neutro Abs: 4.5 10*3/uL (ref 1.7–7.7)
Neutrophils Relative %: 61 %
Platelets: 250 10*3/uL (ref 150–400)
RBC: 4.23 MIL/uL (ref 4.22–5.81)
RDW: 14.3 % (ref 11.5–15.5)
WBC: 7.3 10*3/uL (ref 4.0–10.5)
nRBC: 0 % (ref 0.0–0.2)

## 2020-11-05 LAB — TROPONIN I (HIGH SENSITIVITY)
Troponin I (High Sensitivity): 7 ng/L (ref <18)
Troponin I (High Sensitivity): 6 ng/L (ref <18)

## 2020-11-05 MED ORDER — PREDNISONE 20 MG PO TABS: 60.0000 mg | ORAL_TABLET | Freq: Every day | ORAL | 0 refills | Status: AC

## 2020-11-05 MED ORDER — ALBUTEROL SULFATE HFA 108 (90 BASE) MCG/ACT IN AERS
2.0000 | INHALATION_SPRAY | RESPIRATORY_TRACT | Status: DC
  Filled 2020-11-05: qty 6.7

## 2020-11-05 MED ORDER — ALBUTEROL SULFATE (2.5 MG/3ML) 0.083% IN NEBU
10.0000 mg | INHALATION_SOLUTION | Freq: Once | RESPIRATORY_TRACT | Status: AC
  Administered 2020-11-05: 10 mg via RESPIRATORY_TRACT
  Filled 2020-11-05: qty 12

## 2020-11-05 MED ORDER — ALBUTEROL SULFATE HFA 108 (90 BASE) MCG/ACT IN AERS: 2.0000 | INHALATION_SPRAY | Freq: Four times a day (QID) | RESPIRATORY_TRACT | 2 refills | Status: DC | PRN

## 2020-11-05 MED ORDER — IPRATROPIUM-ALBUTEROL 0.5-2.5 (3) MG/3ML IN SOLN
3.0000 mL | Freq: Once | RESPIRATORY_TRACT | Status: AC
  Administered 2020-11-05: 3 mL via RESPIRATORY_TRACT

## 2020-11-05 MED ORDER — METHYLPREDNISOLONE SODIUM SUCC 125 MG IJ SOLR: 125.0000 mg | Freq: Once | INTRAMUSCULAR | Status: AC

## 2020-11-05 MED ORDER — METHYLPREDNISOLONE SODIUM SUCC 125 MG IJ SOLR
INTRAMUSCULAR | Status: AC
  Administered 2020-11-05: 125 mg via INTRAVENOUS
  Filled 2020-11-05: qty 2

## 2020-11-05 NOTE — Discharge Instructions (Addendum)
You were seen in the Emergency Department (ED) today and was diagnosed with an asthma exacerbation.  ° °During an asthma attack, the airways swell and narrow. This makes it hard to breathe. Severe asthma attacks can be life-threatening, but you can help prevent them by keeping your asthma under control and treating symptoms before they get bad. This can also help you avoid future trips to the emergency room.  ° °Follow-up with your doctor in 1 day for further evaluation. ° °Use albuterol 2 puffs every 4 hours as needed for shortness of breath, wheezing, or cough. Take steroids as prescribed.  ° °When should you call for help?  °Call 911 anytime you think you may need emergency care. For example, call if:  °You have severe trouble breathing. °Call your doctor now or seek immediate medical care if:  °Your symptoms do not get better after you have followed your asthma action plan.  °You have new or worse trouble breathing.  °Your coughing and wheezing get worse.  °You cough up dark brown or bloody mucus (sputum).  °You have a new or higher fever. °Watch closely for changes in your health, and be sure to contact your doctor if:  °You need to use quick-relief medicine on more than 2 days a week (unless it is just for exercise).  °You cough more deeply or more often, especially if you notice more mucus or a change in the color of your mucus.  °You are not getting better as expected. ° °How can you care for yourself at home?  °Follow your asthma action plan to prevent and treat attacks. If you don't have an asthma action plan, work with your doctor to create one.  °Take your asthma medicines exactly as prescribed. Talk to your doctor right away if you have any questions about how to take them.  °Use your quick-relief medicine when you have symptoms of an attack. Quick-relief medicine is usually an albuterol inhaler. Some people need to use quick-relief medicine before they exercise.  °Take your controller medicine every  day, not just when you have symptoms. Controller medicine is usually an inhaled corticosteroid. The goal is to prevent problems before they occur. Don't use your controller medicine to treat an attack that has already started. It doesn't work fast enough to help.  °If your doctor prescribed corticosteroid pills to use during an attack, take them exactly as prescribed. It may take hours for the pills to work, but they may make the episode shorter and help you breathe better.  °Keep your quick-relief medicine with you at all times. °Talk to your doctor before using other medicines. Some medicines, such as aspirin, can cause asthma attacks in some people.  °Learn how to use a peak flow meter to check how well you are breathing. This can help you predict when an asthma attack is going to occur. Then you can take medicine to prevent the asthma attack or make it less severe.  °Do not smoke or allow others to smoke around you. Avoid smoky places. Smoking makes asthma worse. If you need help quitting, talk to your doctor about stop-smoking programs and medicines. These can increase your chances of quitting for good.  °Learn what triggers an asthma attack for you, and avoid the triggers when you can. Common triggers include colds, smoke, air pollution, dust, pollen, mold, pets, cockroaches, stress, and cold air.  °Avoid colds and the flu. Get a pneumococcal vaccine shot. If you have had one before, ask your doctor if   you need a second dose. Get a flu vaccine every fall. If you must be around people with colds or the flu, wash your hands often. ° °To control asthma over the long term  ° °Medicines  °Controller medicines reduce swelling in your lungs. They also prevent asthma attacks. Take your controller medicine exactly as prescribed. Talk to your doctor if you have any problems with your medicine.  °Inhaled corticosteroid is a common and effective controller medicine. Using it the right way can prevent or reduce most side  effects.  °Take your controller medicine every day, not just when you have symptoms. It helps prevent problems before they occur.  °Your doctor may prescribe another medicine that you use along with the corticosteroid. This is often a long-acting bronchodilator. Do not take this medicine by itself. Using a long-acting bronchodilator by itself can increase your risk of a severe or fatal asthma attack.  °Do not take inhaled corticosteroids or long-acting bronchodilators to stop an asthma attack that has already started. They don't work fast enough to help.  °Talk to your doctor before you use other medicines. Some medicines, such as aspirin, can cause asthma attacks in some people. ° °Education  °Learn what triggers an asthma attack. Avoid these triggers when you can. Common triggers include colds, smoke, air pollution, dust, pollen, mold, pets, cockroaches, stress, and cold air.  °If you have a peak flow meter, use it to check how well you are breathing. It can help you know when an asthma attack is going to occur. Then you can take medicine to prevent the asthma attack or make it less severe.  °Do not smoke or allow others to smoke around you. Avoid smoky places. Smoking makes asthma worse. If you need help quitting, talk to your doctor about stop-smoking programs and medicines. These can increase your chances of quitting for good.  °Avoid colds and the flu. Get a pneumococcal vaccine shot. If you have had one before, ask your doctor whether you need a second dose. Get a flu vaccine every year, as soon as it's available. If you must be around people with colds or the flu, wash your hands often. ° °

## 2020-11-05 NOTE — ED Notes (Signed)
X-ray at bedside

## 2020-11-05 NOTE — ED Provider Notes (Signed)
[facility] Emergency Department Provider Note  ____________________________________________  Time seen: Approximately 5:35 AM  I have reviewed the triage vital signs and the nursing notes.   HISTORY  Chief Complaint Shortness of Breath and Chest Pain   HPI Matthew [name] is a 70 y.o. male with a history of asthma and hypertension who presents for evaluation of shortness of breath.  Patient reports that he was mowing his yard earlier today.  Since finishing that he started having progressively worsening shortness of breath and wheezing.  He realized he lost his inhaler today and could not use it.  The shortness of breath became severe this evening which prompted him to call 911.  When EMS arrived patient was satting in the mid 90s but wheezing.  He received duo nebs in route to the reports no significant improvement.  He denies chest pain, cough, fever, history of PE or DVT, recent travel immobilization, leg pain or swelling, hemoptysis, or exogenous hormones.  Patient reports the symptoms feel identical to his prior asthma exacerbations.  Past Medical History:  Diagnosis Date  . Asthma   . Edema   . Hip dislocation, bilateral ([place])   . Hypertension     Patient Active Problem List   Diagnosis Date Noted  . Displaced comminuted fracture of shaft of left femur, initial encounter for closed fracture ([place]) 05/19/2020  . Asthma 05/18/2020  . Essential hypertension 05/18/2020  . Femur fracture, left ([place]) 05/18/2020  . Closed comminuted intra-articular fracture of distal femur ([place]) 05/17/2020  . Acute asthma exacerbation 10/15/2018    Past Surgical History:  Procedure Laterality Date  . ABDOMINAL SURGERY    . CHOLECYSTECTOMY    . COLOSTOMY    . COLOSTOMY TAKEDOWN    . HERNIA REPAIR    . LEFT HEART CATH AND CORONARY ANGIOGRAPHY N/A 10/17/2018   Procedure: LEFT HEART CATH AND CORONARY ANGIOGRAPHY with possible PCI and stent;  Surgeon: Yolonda Kida,  MD;  Location: [place] [facility];  Service: Cardiovascular;  Laterality: N/A;  . ORIF FEMUR FRACTURE Left 05/19/2020   Procedure: OPEN REDUCTION INTERNAL FIXATION (ORIF) DISTAL FEMUR FRACTURE;  Surgeon: Shona Needles, MD;  Location: [facility];  Service: Orthopedics;  Laterality: Left;  . TOTAL HIP ARTHROPLASTY Bilateral     Prior to Admission medications   Medication Sig Start Date End Date Taking? Authorizing Provider  albuterol (VENTOLIN HFA) 108 (90 Base) MCG/ACT inhaler Inhale 2 puffs into the lungs every 6 (six) hours as needed for wheezing or shortness of breath. 11/05/20  Yes Alfred Levins, [place], MD  predniSONE (DELTASONE) 20 MG tablet Take 3 tablets (60 mg total) by mouth daily with breakfast for 4 days. 11/05/20 11/09/20 Yes Veronese, [place], MD  albuterol (VENTOLIN HFA) 108 (90 Base) MCG/ACT inhaler Inhale 1 puff into the lungs daily. 05/20/20   [name], MD  allopurinol (ZYLOPRIM) 100 MG tablet Take 1 tablet (100 mg total) by mouth daily. Patient taking differently: Take 100 mg by mouth as needed (gout).  03/22/20   [name], MD  amLODipine (NORVASC) 10 MG tablet Take 1 tablet (10 mg total) by mouth daily. 05/24/20   Nita Sells, MD  colchicine 0.6 MG tablet Take 0.6 mg by mouth as needed (gout).  03/17/20   [provider]  DULoxetine (CYMBALTA) 60 MG capsule TAKE 1 CAPSULE BY MOUTH TWICE DAILY 05/27/20   [name], MD  enoxaparin (LOVENOX) 40 MG/0.4ML injection Inject 0.4 mLs (40 mg total) into the skin daily. 05/22/20  06/21/20  Haddix, Thomasene Lot, MD  gabapentin (NEURONTIN) 300 MG capsule Take 1 capsule (300 mg total) by mouth 2 (two) times daily. 05/24/20   Nita Sells, MD  methocarbamol (ROBAXIN) 500 MG tablet Take 1 tablet (500 mg total) by mouth every 6 (six) hours as needed for muscle spasms. 05/21/20   Haddix, Thomasene Lot, MD  naloxone [facility]) 2 MG/2ML injection Inject 2 mg into the muscle as needed.  04/05/20   [provider]  oxyCODONE 10 MG  TABS Take 1-1.5 tablets (10-15 mg total) by mouth every 4 (four) hours as needed for severe pain (pain score 7-10). 05/21/20   Haddix, Thomasene Lot, MD  polyethylene glycol (MIRALAX / GLYCOLAX) 17 g packet Take 17 g by mouth daily as needed for mild constipation. 05/24/20   Nita Sells, MD  SEREVENT DISKUS 50 MCG/DOSE diskus inhaler Inhale 1 puff into the lungs 2 (two) times daily. Patient not taking: Reported on 05/18/2020 12/22/19   [name], MD    Allergies Demerol [meperidine hcl]  Family History  Problem Relation Age of Onset  . COPD Mother   . Melanoma Father     Social History Social History   Tobacco Use  . Smoking status: Never Smoker  . Smokeless tobacco: Never Used  Vaping Use  . Vaping Use: Never used  Substance Use Topics  . Alcohol use: Not Currently    Review of Systems  Constitutional: Negative for fever. Eyes: Negative for visual changes. ENT: Negative for sore throat. Neck: No neck pain  Cardiovascular: Negative for chest pain. Respiratory: + shortness of breath and wheezing Gastrointestinal: Negative for abdominal pain, vomiting or diarrhea. Genitourinary: Negative for dysuria. Musculoskeletal: Negative for back pain. Skin: Negative for rash. Neurological: Negative for headaches, weakness or numbness. Psych: No SI or HI  ____________________________________________   PHYSICAL EXAM:  VITAL SIGNS: Vitals:   11/05/20 0400 11/05/20 0600  BP: (!) 158/79 137/87  Pulse: 87 92  Resp: (!) 21 20  Temp:    SpO2: 97% 94%    Constitutional: Alert and oriented. Well appearing and in no apparent distress. HEENT:      Head: Normocephalic and atraumatic.         Eyes: Conjunctivae are normal. Sclera is non-icteric.       Mouth/Throat: Mucous membranes are moist.       Neck: Supple with no signs of meningismus. Cardiovascular: Regular rate and rhythm. No murmurs, gallops, or rubs. 2+ symmetrical distal pulses are present in all extremities. No  JVD. Respiratory: Tachypneic with increased work of breathing, wheezing bilaterally Gastrointestinal: Soft, non tender. Musculoskeletal:  No edema, cyanosis, or erythema of extremities. Neurologic: Normal speech and language. Face is symmetric. Moving all extremities. No gross focal neurologic deficits are appreciated. Skin: Skin is warm, dry and intact. No rash noted. Psychiatric: Mood and affect are normal. Speech and behavior are normal.  ____________________________________________   LABS (all labs ordered are listed, but only abnormal results are displayed)  Labs Reviewed  CBC WITH DIFFERENTIAL/PLATELET - Abnormal; Notable for the following components:      Result Value   MCV 101.9 (*)    All other components within normal limits  BASIC METABOLIC PANEL - Abnormal; Notable for the following components:   Glucose, Bld 111 (*)    Creatinine, Ser 1.41 (*)    GFR, Estimated 54 (*)    All other components within normal limits  TROPONIN I (HIGH SENSITIVITY)  TROPONIN I (HIGH SENSITIVITY)   ____________________________________________  EKG  ED  ECG REPORT I, Rudene Re, the attending physician, personally viewed and interpreted this ECG.  Normal sinus rhythm, rate of 83, normal intervals, normal axis, no ST elevations or depressions.  Normal EKG. ____________________________________________  RADIOLOGY  I have personally reviewed the images performed during this visit and I agree with the Radiologist's read.   Interpretation by Radiologist:  DG Chest Portable 1 View  Result Date: 11/05/2020 CLINICAL DATA:  Shortness of breath EXAM: PORTABLE CHEST 1 VIEW COMPARISON:  Radiograph, CT 08/13/2020 FINDINGS: Chronic hyperinflation coarsened interstitial and bronchitic features which are grossly similar to prior. Some accentuation is technique related. No superimposed consolidative process or convincing features of edema. No pneumothorax or visible effusion. Stable  cardiomediastinal contours. Telemetry leads overlie the chest. No other acute osseous or soft tissue abnormality. Degenerative changes are present in the imaged spine and shoulders. IMPRESSION: Chronic hyperinflation and coarsened interstitial and bronchitic features, grossly similar to prior. No superimposed acute cardiopulmonary abnormality. Electronically Signed   By: Lovena Le M.D.   On: 11/05/2020 01:31     ____________________________________________   PROCEDURES  Procedure(s) performed:yes .1-3 Lead EKG Interpretation Performed by: Rudene Re, MD Authorized by: Rudene Re, MD     Interpretation: normal     ECG rate assessment: normal     Rhythm: sinus rhythm     Ectopy: none     Conduction: normal     Critical Care performed:  None ____________________________________________   INITIAL IMPRESSION / ASSESSMENT AND PLAN / ED COURSE  70 y.o. male with a history of asthma and hypertension who presents for evaluation of shortness of breath and wheezing that started after he mowed the lawn.  Patient lost his inhaler and was unable to treat himself at home.  Not hypoxic per EMS.  Arrives with increased work of breathing, tachypneic, wheezing but no hypoxia.  Ddx asthma exacerbation, bronchitis, PNA, PTX, edema.  With no fever cough low suspicion for viral syndrome.  EKG nonischemic, troponin x2 -.  Chest x-ray visualized by me with no acute infiltrates or pneumothorax, confirmed by radiology.  Labs with no leukocytosis, no significant electrolyte derangements.  After 3 duo nebs and Solu-Medrol patient moving better air with improving wheezing but is still persistent.  Patient was then placed on 10 mg of albuterol for an hour. Patient felt improved after 1 hr of continuous albuterol. Ambulatory sats maintained above 93%. Patient feels back to baseline and is requesting dc at this time. Patient given albuterol inhaler and will dc home on steroids as well.  Old medical  records reviewed.  Patient placed on telemetry for close monitoring    _____________________________________________ Please note:  Patient was evaluated in Emergency Department today for the symptoms described in the history of present illness. Patient was evaluated in the context of the global COVID-19 pandemic, which necessitated consideration that the patient might be at risk for infection with the SARS-CoV-2 virus that causes COVID-19. Institutional protocols and algorithms that pertain to the evaluation of patients at risk for COVID-19 are in a state of rapid change based on information released by regulatory bodies including the CDC and federal and state organizations. These policies and algorithms were followed during the patient's care in the ED.  Some ED evaluations and interventions may be delayed as a result of limited staffing during the pandemic.    Controlled Substance Database was reviewed by me. ____________________________________________   FINAL CLINICAL IMPRESSION(S) / ED DIAGNOSES   Final diagnoses:  Exacerbation of asthma, unspecified asthma severity, unspecified whether persistent  NEW MEDICATIONS STARTED DURING THIS VISIT:  ED Discharge Orders         Ordered    albuterol (VENTOLIN HFA) 108 (90 Base) MCG/ACT inhaler  Every 6 hours PRN        11/05/20 0622    predniSONE (DELTASONE) 20 MG tablet  Daily with breakfast        11/05/20 0622           Note:  This document was prepared using Dragon voice recognition software and may include unintentional dictation errors.    Rudene Re, MD 11/05/20 [id number]

## 2020-11-05 NOTE — ED Notes (Signed)
Pharmacy messaged for inhaler. Ambulatory pulse ox completed by other RN. Patient sats maintained above 92% and pt reports feeling improved and would like to be discharged. Provider aware.

## 2020-11-05 NOTE — ED Notes (Signed)
Patient assisted to the front to wait for cab in wheelchair with belongings. Voucher provided and handed to first nurse.

## 2020-11-05 NOTE — ED Triage Notes (Signed)
Pt to ED via EMS from home c/o SOB 6 hours ago, was mowing the yard.  Denies cough, n/v/d, or fevers.  Hx asthma, lost inhaler today so couldn't use.  Pt A&Ox4, lung sounds tight throughout.

## 2020-12-03 ENCOUNTER — Observation Stay
Admission: EM | Admit: 2020-12-03 | Discharge: 2020-12-04 | Disposition: A | Discharge status: Home / Self Care | Payer: Medicare HMO | Attending: Internal Medicine | Admitting: Internal Medicine

## 2020-12-03 ENCOUNTER — Encounter: Payer: Self-pay | Admitting: Emergency Medicine

## 2020-12-03 ENCOUNTER — Emergency Department: Payer: Medicare HMO

## 2020-12-03 ENCOUNTER — Other Ambulatory Visit: Payer: Self-pay

## 2020-12-03 DIAGNOSIS — M25551 Pain in right hip: Secondary | ICD-10-CM | POA: Diagnosis not present

## 2020-12-03 DIAGNOSIS — J441 Chronic obstructive pulmonary disease with (acute) exacerbation: Secondary | ICD-10-CM | POA: Diagnosis not present

## 2020-12-03 DIAGNOSIS — J45909 Unspecified asthma, uncomplicated: Secondary | ICD-10-CM | POA: Insufficient documentation

## 2020-12-03 DIAGNOSIS — J90 Pleural effusion, not elsewhere classified: Secondary | ICD-10-CM | POA: Diagnosis not present

## 2020-12-03 DIAGNOSIS — I10 Essential (primary) hypertension: Secondary | ICD-10-CM | POA: Diagnosis present

## 2020-12-03 DIAGNOSIS — R0602 Shortness of breath: Secondary | ICD-10-CM | POA: Diagnosis not present

## 2020-12-03 DIAGNOSIS — Z20822 Contact with and (suspected) exposure to covid-19: Secondary | ICD-10-CM | POA: Diagnosis not present

## 2020-12-03 DIAGNOSIS — S79911A Unspecified injury of right hip, initial encounter: Secondary | ICD-10-CM | POA: Diagnosis not present

## 2020-12-03 DIAGNOSIS — N1832 Chronic kidney disease, stage 3b: Secondary | ICD-10-CM

## 2020-12-03 DIAGNOSIS — R062 Wheezing: Secondary | ICD-10-CM | POA: Diagnosis present

## 2020-12-03 DIAGNOSIS — Z79899 Other long term (current) drug therapy: Secondary | ICD-10-CM | POA: Diagnosis not present

## 2020-12-03 DIAGNOSIS — I129 Hypertensive chronic kidney disease with stage 1 through stage 4 chronic kidney disease, or unspecified chronic kidney disease: Secondary | ICD-10-CM | POA: Diagnosis not present

## 2020-12-03 DIAGNOSIS — Z96643 Presence of artificial hip joint, bilateral: Secondary | ICD-10-CM

## 2020-12-03 DIAGNOSIS — Z96641 Presence of right artificial hip joint: Secondary | ICD-10-CM | POA: Diagnosis not present

## 2020-12-03 DIAGNOSIS — R609 Edema, unspecified: Secondary | ICD-10-CM | POA: Diagnosis not present

## 2020-12-03 DIAGNOSIS — J449 Chronic obstructive pulmonary disease, unspecified: Secondary | ICD-10-CM | POA: Diagnosis present

## 2020-12-03 LAB — BASIC METABOLIC PANEL
Anion gap: 9 (ref 5–15)
BUN: 31 mg/dL — ABNORMAL HIGH (ref 8–23)
CO2: 25 mmol/L (ref 22–32)
Calcium: 10.7 mg/dL — ABNORMAL HIGH (ref 8.9–10.3)
Chloride: 105 mmol/L (ref 98–111)
Creatinine, Ser: 1.84 mg/dL — ABNORMAL HIGH (ref 0.61–1.24)
GFR, Estimated: 39 mL/min — ABNORMAL LOW (ref >60)
Glucose, Bld: 169 mg/dL — ABNORMAL HIGH (ref 70–99)
Potassium: 4.4 mmol/L (ref 3.5–5.1)
Sodium: 139 mmol/L (ref 135–145)

## 2020-12-03 LAB — CBC
HCT: 44.2 % (ref 39.0–52.0)
Hemoglobin: 14.5 g/dL (ref 13.0–17.0)
MCH: 33.6 pg (ref 26.0–34.0)
MCHC: 32.8 g/dL (ref 30.0–36.0)
MCV: 102.6 fL — ABNORMAL HIGH (ref 80.0–100.0)
Platelets: 303 10*3/uL (ref 150–400)
RBC: 4.31 MIL/uL (ref 4.22–5.81)
RDW: 12.9 % (ref 11.5–15.5)
WBC: 10.4 10*3/uL (ref 4.0–10.5)
nRBC: 0 % (ref 0.0–0.2)

## 2020-12-03 MED ORDER — MORPHINE SULFATE (PF) 4 MG/ML IV SOLN
4.0000 mg | Freq: Once | INTRAVENOUS | Status: AC
  Administered 2020-12-03: 4 mg via INTRAVENOUS
  Filled 2020-12-03: qty 1

## 2020-12-03 MED ORDER — HYDROCODONE-ACETAMINOPHEN 5-325 MG PO TABS: 1.0000 | ORAL_TABLET | ORAL | 0 refills | Status: DC | PRN

## 2020-12-03 MED ORDER — ALBUTEROL SULFATE (2.5 MG/3ML) 0.083% IN NEBU
2.5000 mg | INHALATION_SOLUTION | RESPIRATORY_TRACT | Status: DC | PRN
  Administered 2020-12-03: 2.5 mg via RESPIRATORY_TRACT
  Filled 2020-12-03: qty 3

## 2020-12-03 NOTE — ED Notes (Addendum)
This RN attempted to call pt wife again for pickup at this time. Pt wife stated to this RN that she is unable to pick pt up due to taking her nightly medication so she is unable to drive. This RN informed pt wife that [place] would be informed to determine next steps. Charge RN Lorriane Shire spoke with pt wife about picking him up/finding reliable transportation at this time. Pt wife repeatedly discussed the option of calling a cab. This Designer, multimedia both informed pt wife repeatedly that multiple attempts were made at calling a taxi service, but they were closed due to staffing.   Charge RN Lorriane Shire told this RN to bring pt into ED lobby and provide pt with comfortable chair and walker/wheelchair. This RN, April RN, and Laurette Schimke RN assisted pt from wheelchair into recliner chair in ED waiting room within visual range of First RN. Pt given warm blankets, access to walker and wheelchair, drink, and meal tray at this time. Pt denies further needs. First RN Dawn made aware of situation.   This RN also informed Charge RN Lorriane Shire that pt is not able to ambulate independently at this time, is a x2 staff assist with standing and is a high fall risk. This RN advised that pt get a PT consult. Charge RN Lorriane Shire aware.

## 2020-12-03 NOTE — ED Notes (Signed)
Pt told this RN that wife is coming to pick up pt. Pt verbalized understanding of d/c instructions at this time and denies further questions. Pt assisted into wheelchair by this RN and Lexi RN. Pt assisted to ED entrance by this RN, told to tell wife to get assistance from RNs in lobby to get pt into the car. Pt verbalized understanding. First RN aware at this time.

## 2020-12-03 NOTE — ED Notes (Addendum)
Unable to reach [employer] at this time. Gave pt phone to call wife for ride at this time

## 2020-12-03 NOTE — ED Notes (Addendum)
Wife called at this time- states she cannot pick up pt, and there is no one else who can pick him up. Pt sitting in ED lobby at this time. Charge RN notified of situation.

## 2020-12-03 NOTE — Discharge Instructions (Addendum)

## 2020-12-03 NOTE — ED Triage Notes (Signed)
First NUrse Note:  Per report, had left knee surgery 3 days ago at [facility]   Today when standing up from the toilet, patient felt a pop to right hip.  C/O pain right hip.  VS wnl.

## 2020-12-03 NOTE — ED Notes (Signed)
Attempted to call [facility] 2x at this time with no answer. Will re-attempt shortly
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
## 2020-12-03 NOTE — ED Notes (Signed)
Informed pt that "either Matthew [name] or Matthew [name] will pick you up" per charge RN Lorriane Shire.

## 2020-12-04 ENCOUNTER — Emergency Department: Payer: Medicare HMO

## 2020-12-04 ENCOUNTER — Other Ambulatory Visit: Payer: Self-pay

## 2020-12-04 DIAGNOSIS — Z96643 Presence of artificial hip joint, bilateral: Secondary | ICD-10-CM

## 2020-12-04 DIAGNOSIS — R0602 Shortness of breath: Secondary | ICD-10-CM | POA: Diagnosis not present

## 2020-12-04 DIAGNOSIS — N1832 Chronic kidney disease, stage 3b: Secondary | ICD-10-CM

## 2020-12-04 DIAGNOSIS — J441 Chronic obstructive pulmonary disease with (acute) exacerbation: Secondary | ICD-10-CM | POA: Diagnosis not present

## 2020-12-04 DIAGNOSIS — J449 Chronic obstructive pulmonary disease, unspecified: Secondary | ICD-10-CM | POA: Diagnosis present

## 2020-12-04 DIAGNOSIS — M25551 Pain in right hip: Secondary | ICD-10-CM

## 2020-12-04 DIAGNOSIS — J90 Pleural effusion, not elsewhere classified: Secondary | ICD-10-CM | POA: Diagnosis not present

## 2020-12-04 DIAGNOSIS — I10 Essential (primary) hypertension: Secondary | ICD-10-CM

## 2020-12-04 LAB — CBC
HCT: 40 % (ref 39.0–52.0)
Hemoglobin: 13.4 g/dL (ref 13.0–17.0)
MCH: 34.3 pg — ABNORMAL HIGH (ref 26.0–34.0)
MCHC: 33.5 g/dL (ref 30.0–36.0)
MCV: 102.3 fL — ABNORMAL HIGH (ref 80.0–100.0)
Platelets: 293 10*3/uL (ref 150–400)
RBC: 3.91 MIL/uL — ABNORMAL LOW (ref 4.22–5.81)
RDW: 13 % (ref 11.5–15.5)
WBC: 7.9 10*3/uL (ref 4.0–10.5)
nRBC: 0 % (ref 0.0–0.2)

## 2020-12-04 LAB — CREATININE, SERUM
Creatinine, Ser: 1.81 mg/dL — ABNORMAL HIGH (ref 0.61–1.24)
GFR, Estimated: 40 mL/min — ABNORMAL LOW (ref >60)

## 2020-12-04 LAB — RESP PANEL BY RT-PCR (FLU A&B, COVID) ARPGX2
Influenza A by PCR: NEGATIVE
Influenza B by PCR: NEGATIVE
SARS Coronavirus 2 by RT PCR: NEGATIVE

## 2020-12-04 LAB — HIV ANTIBODY (ROUTINE TESTING W REFLEX): HIV Screen 4th Generation wRfx: NONREACTIVE

## 2020-12-04 MED ORDER — IPRATROPIUM-ALBUTEROL 0.5-2.5 (3) MG/3ML IN SOLN
3.0000 mL | Freq: Four times a day (QID) | RESPIRATORY_TRACT | Status: DC
  Administered 2020-12-04 (×2): 3 mL via RESPIRATORY_TRACT
  Filled 2020-12-04 (×2): qty 3

## 2020-12-04 MED ORDER — PREDNISONE 20 MG PO TABS
40.0000 mg | ORAL_TABLET | Freq: Every day | ORAL | Status: DC
  Administered 2020-12-04: 40 mg via ORAL
  Filled 2020-12-04: qty 2

## 2020-12-04 MED ORDER — ENOXAPARIN SODIUM 80 MG/0.8ML ~~LOC~~ SOLN
0.5000 mg/kg | SUBCUTANEOUS | Status: DC
  Administered 2020-12-04: 65 mg via SUBCUTANEOUS
  Filled 2020-12-04 (×2): qty 0.8

## 2020-12-04 MED ORDER — IBUPROFEN 400 MG PO TABS
600.0000 mg | ORAL_TABLET | ORAL | Status: DC | PRN
  Administered 2020-12-04: 600 mg via ORAL
  Filled 2020-12-04: qty 1

## 2020-12-04 MED ORDER — MORPHINE SULFATE (PF) 2 MG/ML IV SOLN: 2.0000 mg | INTRAVENOUS | Status: DC | PRN

## 2020-12-04 MED ORDER — HYDROCODONE-ACETAMINOPHEN 5-325 MG PO TABS
1.0000 | ORAL_TABLET | ORAL | Status: DC | PRN
  Administered 2020-12-04: 1 via ORAL
  Filled 2020-12-04 (×3): qty 1

## 2020-12-04 MED ORDER — HYDROCODONE-ACETAMINOPHEN 5-325 MG PO TABS: 1.0000 | ORAL_TABLET | Freq: Four times a day (QID) | ORAL | 0 refills | Status: AC | PRN

## 2020-12-04 MED ORDER — ACETAMINOPHEN 325 MG PO TABS: 650.0000 mg | ORAL_TABLET | Freq: Four times a day (QID) | ORAL | Status: DC | PRN

## 2020-12-04 MED ORDER — ALBUTEROL SULFATE (2.5 MG/3ML) 0.083% IN NEBU: 2.5000 mg | INHALATION_SOLUTION | RESPIRATORY_TRACT | Status: DC | PRN

## 2020-12-04 MED ORDER — PREDNISONE 20 MG PO TABS: 40.0000 mg | ORAL_TABLET | Freq: Every day | ORAL | 0 refills | Status: AC

## 2020-12-04 MED ORDER — AMLODIPINE BESYLATE 10 MG PO TABS
10.0000 mg | ORAL_TABLET | Freq: Every day | ORAL | Status: DC
  Administered 2020-12-04: 10 mg via ORAL
  Filled 2020-12-04: qty 1

## 2020-12-04 MED ORDER — HYDROCODONE-ACETAMINOPHEN 5-325 MG PO TABS
1.0000 | ORAL_TABLET | ORAL | Status: DC | PRN
  Administered 2020-12-04 (×2): 1 via ORAL
  Filled 2020-12-04: qty 1

## 2020-12-04 MED ORDER — METHYLPREDNISOLONE SODIUM SUCC 125 MG IJ SOLR
125.0000 mg | Freq: Once | INTRAMUSCULAR | Status: AC
  Administered 2020-12-04: 125 mg via INTRAVENOUS
  Filled 2020-12-04: qty 2

## 2020-12-04 NOTE — Progress Notes (Signed)
Pt given d/c education and all questions answered, IV removed. Made aware of scriptsx2 at pharmacy and when to take/when able to take prns again. Pt calling ride for pick up.

## 2020-12-04 NOTE — TOC Transition Note (Signed)
Transition of Care [facility]) - CM/SW Discharge Note   Patient Details  Name: PRINTICE HELLMER MRN: [id number] Date of Birth: 04-28-1951  [facility]) CM/SW Contact:  Boris Sharper, LCSW Phone Number: 12/04/2020, 11:59 AM   Clinical Narrative:    Pt id medically stable for discharge per MD. Pt will be going home with [facility], [place] notified pt and spouse of discharge. CSW spoke with Brittney and arranged [facility] through [facility]. Pt stated that he has al  necessary DME.   Final next level of care: Home w Home Health Services Barriers to Discharge: No Barriers Identified   Patient Goals and CMS Choice   CMS Medicare.gov Compare Post Acute Care list provided to:: Patient Choice offered to / list presented to : Patient  Discharge Placement                Patient to be transferred to facility by: Spouse Name of family member notified: [name] Patient and family notified of of transfer: 12/04/20  Discharge Plan and Services                          HH Arranged: PT [facility] Agency: [facility] [place] Date [place]: 12/04/20 Time [place]: [place] Representative spoke with at [place]: [place] ([place]) Interventions     Readmission Risk Interventions No flowsheet data found.

## 2020-12-04 NOTE — Plan of Care (Signed)

## 2020-12-04 NOTE — Progress Notes (Signed)
PHARMACIST - PHYSICIAN COMMUNICATION  CONCERNING:  Enoxaparin (Lovenox) for DVT Prophylaxis    RECOMMENDATION: Patient was prescribed enoxaprin 40mg  q24 hours for VTE prophylaxis.   Filed Weights   12/03/20 1516  Weight: 129 kg (284 lb 6.3 oz)    Body mass index is 36.51 kg/m.  Estimated Creatinine Clearance: 53.3 mL/min (A) (by C-G formula based on SCr of 1.84 mg/dL (H)).   Based on [place] patient is candidate for enoxaparin 0.5mg /kg TBW SQ every 24 hours based on BMI being >30.  DESCRIPTION: Pharmacy has adjusted enoxaparin dose per [facility] policy.  Patient is now receiving enoxaparin 0.5 mg/kg every 24 hours   Renda Rolls, PharmD, [facility] 12/04/2020 3:23 AM
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
## 2020-12-04 NOTE — ED Notes (Signed)
Patient is resting comfortably. 

## 2020-12-04 NOTE — Evaluation (Signed)
Physical Therapy Evaluation Patient Details Name: Matthew [name] MRN: [id number] DOB: 1951/04/13 Today's Date: 12/04/2020   History of Present Illness  70 yo male with history of B hip THA's with posterior approach was admitted with COPD exacerbation, has been in bed several days.  Had a fall at his recliner and had pain on R hip which is chronic per pt.  PMHx:  B THA's with mult revisions, COPD, HTN, asthma  Clinical Impression  Pt was seen for mobility on RW with help to get up and to supervise safety on transfers, but pt is close to PLOF with ongoing issues of hip ROM and strength.  Has been revised on both hips mult times apparently, and has chronic ER posture of R hip with chronic weakness.  Pt is somewhat aware of his posterior precautions, and talked with him about the ongoing limits of them.  Follow acutely for goals of PT and will recommend follow up therapy for work on strength and ROM of hips to work against the tightness and weakness from mult surgeries in the past.      Follow Up Recommendations Home health PT;Supervision for mobility/OOB    Equipment Recommendations  Rolling walker with 5" wheels    Recommendations for Other Services       Precautions / Restrictions Precautions Precautions: Fall Precaution Comments: chronic hip pain Restrictions Weight Bearing Restrictions: No      Mobility  Bed Mobility Overal bed mobility: Needs Assistance Bed Mobility: Supine to Sit     Supine to sit: Min assist     General bed mobility comments: min assist to get OOB but pt is lifting RLE himself    Transfers Overall transfer level: Needs assistance Equipment used: Rolling walker (2 wheeled);1 person hand held assist Transfers: Sit to/from Stand Sit to Stand: Min assist;Min guard         General transfer comment: pt is limited from before fall, uses Armchair to stand with UE support  Ambulation/Gait Ambulation/Gait assistance: Min guard Gait Distance (Feet): 60  Feet Assistive device: Rolling walker (2 wheeled);1 person hand held assist Gait Pattern/deviations: Step-to pattern;Step-through pattern;Decreased stride length;Decreased weight shift to right;Wide base of support Gait velocity: reduced Gait velocity interpretation: <1.31 ft/sec, indicative of household ambulator General Gait Details: pt has been in bed several days and is slow to move but able to get through hall and back to chair including controlling descent  Stairs            Wheelchair Mobility    Modified Rankin (Stroke Patients Only)       Balance Overall balance assessment: Needs assistance Sitting-balance support: Feet supported Sitting balance-Leahy Scale: Good   Postural control: Left lateral lean Standing balance support: Bilateral upper extremity supported;During functional activity Standing balance-Leahy Scale: Fair                               Pertinent Vitals/Pain Pain Assessment: 0-10 Pain Score: 7  Pain Location: B hips Pain Descriptors / Indicators: Guarding Pain Intervention(s): Monitored during session;Repositioned    Home Living Family/patient expects to be discharged to:: Private residence Living Arrangements: Spouse/significant other Available Help at Discharge: Family Type of Home: House Home Access: Stairs to enter   Technical brewer of Steps: 5 Home Layout: One level Home Equipment: Environmental consultant - 2 wheels Additional Comments: usually uses RW    Prior Function Level of Independence: Independent  Comments: was able to walk with no help but wife is there to assist him     Hand Dominance   Dominant Hand: Right    Extremity/Trunk Assessment   Upper Extremity Assessment Upper Extremity Assessment: Overall WFL for tasks assessed    Lower Extremity Assessment Lower Extremity Assessment: RLE deficits/detail;LLE deficits/detail RLE Deficits / Details: hip in chronic ER RLE Coordination: decreased gross  motor LLE Deficits / Details: hip is limited for flexion LLE Coordination: decreased gross motor    Cervical / Trunk Assessment Cervical / Trunk Assessment: Kyphotic (mild)  Communication   Communication: No difficulties  Cognition Arousal/Alertness: Awake/alert Behavior During Therapy: WFL for tasks assessed/performed Overall Cognitive Status: Within Functional Limits for tasks assessed                                        General Comments General comments (skin integrity, edema, etc.): pt is awkward standing but this is his PLOF with controlling standing using LUE    Exercises     Assessment/Plan    PT Assessment Patient needs continued PT services  PT Problem List Decreased strength;Decreased range of motion;Decreased activity tolerance;Decreased balance;Decreased mobility;Decreased coordination;Cardiopulmonary status limiting activity;Pain;Decreased skin integrity       PT Treatment Interventions DME instruction;Gait training;Stair training;Functional mobility training;Therapeutic activities;Therapeutic exercise;Balance training;Neuromuscular re-education;Patient/family education    PT Goals (Current goals can be found in the Care Plan section)  Acute Rehab PT Goals Patient Stated Goal: to get home and get R hip stronger PT Goal Formulation: With patient Time For Goal Achievement: 12/18/20    Frequency Min 2X/week   Barriers to discharge Decreased caregiver support home with wife    Co-evaluation               AM-PAC PT "6 Clicks" Mobility  Outcome Measure Help needed turning from your back to your side while in a flat bed without using bedrails?: A Little Help needed moving from lying on your back to sitting on the side of a flat bed without using bedrails?: A Little Help needed moving to and from a bed to a chair (including a wheelchair)?: A Little Help needed standing up from a chair using your arms (e.g., wheelchair or bedside chair)?:  A Little Help needed to walk in hospital room?: A Little Help needed climbing 3-5 steps with a railing? : A Lot 6 Click Score: 17    End of Session Equipment Utilized During Treatment: Gait belt Activity Tolerance: Patient tolerated treatment well;Patient limited by pain Patient left: in chair Nurse Communication: Mobility status PT Visit Diagnosis: Muscle weakness (generalized) (M62.81);History of falling (Z91.81)    Time: 0277-4128 PT Time Calculation (min) (ACUTE ONLY): 18 min   Charges:   PT Evaluation $PT Eval Moderate Complexity: 1 Mod         Ramond Dial 12/04/2020, 12:16 PM Mee Hives, PT MS Acute Rehab Dept. Number: [place] and [place]

## 2020-12-04 NOTE — Evaluation (Signed)
Occupational Therapy Evaluation Patient Details Name: Matthew [name] MRN: [id number] DOB: Dec 24, 1950 Today's Date: 12/04/2020    History of Present Illness 70 yo male with history of B hip THA's with posterior approach was admitted with COPD exacerbation, has been in bed several days.  Had a fall at his recliner and had pain on R hip which is chronic per pt.  PMHx:  B THA's with mult revisions, COPD, HTN, asthma   Clinical Impression   [name] presents to OT with chronic arthritis pain, including acute R hip pain after a fall, that impacts his engagement in functional tasks.  Prior to admission, pt was able to complete basic ADLs with modified independence using RW.  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  Currently, pt appears to be close to his baseline level of functioning.  Pt endorses chronic pain that occasionally limits his engagement in self care tasks, but reports this is his baseline and is not interested in continued OT services.  OTR provided education re: home safety strategies and pain management, pt denied any further questions and endorsed feeling safe in self-care engagement after discharge.  Do not anticipate need for OT follow up in acute setting or after discharge as pt appears to be close to his baseline.  Will discharge pt from OT caseload, please re-consult OT if pt has change in functional status or additional OT-related needs arise.    Follow Up Recommendations  No OT follow up    Equipment Recommendations  None recommended by OT    Recommendations for Other Services       Precautions / Restrictions Precautions Precautions: Fall Precaution Comments: chronic hip pain Restrictions Weight Bearing Restrictions: No      Mobility Bed Mobility Overal bed mobility: Needs Assistance Bed Mobility: Supine to Sit     Supine to sit: Min assist     General bed mobility comments: not tested, pt received in recliner Patient Response:  Cooperative  Transfers Overall transfer level: Needs assistance Equipment used: Rolling walker (2 wheeled);1 person hand held assist Transfers: Sit to/from Stand Sit to Stand: Min assist;Min guard         General transfer comment: pt is limited from before fall, uses armchair to stand with UE support    Balance Overall balance assessment: Needs assistance Sitting-balance support: Feet supported Sitting balance-Leahy Scale: Good   Postural control: Left lateral lean Standing balance support: Bilateral upper extremity supported;During functional activity Standing balance-Leahy Scale: Fair                             ADL either performed or assessed with clinical judgement   ADL Overall ADL's : At baseline                                       General ADL Comments: Pt appears to be at baseline level of functioning- is able to complete basic ADLs with modified independence using RW and has support for IADLs as needed.     Vision Baseline Vision/History: No visual deficits Patient Visual Report: No change from baseline       Perception     Praxis      Pertinent Vitals/Pain Pain Assessment: Faces Pain Score: 7  Faces Pain Scale: Hurts little more Pain Location: R hip Pain Descriptors / Indicators: Guarding  Pain Intervention(s): Limited activity within patient's tolerance;Monitored during session     Hand Dominance Right   Extremity/Trunk Assessment Upper Extremity Assessment Upper Extremity Assessment: Overall WFL for tasks assessed   Lower Extremity Assessment Lower Extremity Assessment: Defer to PT evaluation (limited by arthritis pain) RLE Deficits / Details: hip in chronic ER RLE Coordination: decreased gross motor LLE Deficits / Details: hip is limited for flexion LLE Coordination: decreased gross motor   Cervical / Trunk Assessment Cervical / Trunk Assessment: Kyphotic (mild)   Communication Communication Communication: No  difficulties   Cognition Arousal/Alertness: Awake/alert Behavior During Therapy: WFL for tasks assessed/performed Overall Cognitive Status: Within Functional Limits for tasks assessed                                 General Comments: grossly oriented, pleasant and engaged throughout   General Comments  pt is awkward standing but this is his PLOF with controlling standing using LUE    Exercises Exercises: Other exercises (R hip is 3- strength at eval but per pt has been weak previously) Other Exercises Other Exercises: provided education re: OT role and plan of care, fall and safety precautions, home safety in ADLs   Shoulder Instructions      Home Living Family/patient expects to be discharged to:: Private residence Living Arrangements: Spouse/significant other Available Help at Discharge: Family Type of Home: Mobile home Home Access: Stairs to enter Entrance Stairs-Number of Steps: 4-5 Entrance Stairs-Rails: Right;Left;Can reach both Home Layout: One level     Bathroom Shower/Tub: Tub/shower unit;Walk-in shower   Bathroom Toilet: Standard Bathroom Accessibility: No   Home Equipment: Walker - 2 wheels;Grab bars - tub/shower;Toilet riser;Tub bench   Additional Comments: usually uses RW      Prior Functioning/Environment Level of Independence: Needs assistance  Gait / Transfers Assistance Needed: pt was mod I using RW ADL's / Homemaking Assistance Needed: pt able to complete basic ADLs with mod I, receives assistance as needed from wife when limited by arthritis pain   Comments: Pt does not drive, enjoys doing things around the home including mowing the lawn.  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        OT Problem List: Impaired balance (sitting and/or standing);Pain      OT Treatment/Interventions:      OT Goals(Current goals can be found in the care plan section) Acute Rehab OT Goals Patient Stated Goal: to get home and get R  hip stronger OT Goal Formulation: With patient  OT Frequency:     Barriers to D/C:            Co-evaluation              AM-PAC OT "6 Clicks" Daily Activity     Outcome Measure Help from another person eating meals?: None Help from another person taking care of personal grooming?: None Help from another person toileting, which includes using toliet, bedpan, or urinal?: None Help from another person bathing (including washing, rinsing, drying)?: A Little Help from another person to put on and taking off regular upper body clothing?: None Help from another person to put on and taking off regular lower body clothing?: A Little 6 Click Score: 22   End of Session Equipment Utilized During Treatment: Rolling walker  Activity Tolerance: Patient tolerated treatment well Patient left: in chair;with call bell/phone within reach  OT Visit Diagnosis: Other abnormalities of gait and  mobility (R26.89);Pain Pain - Right/Left: Right Pain - part of body: Hip                Time: 5053-9767 OT Time Calculation (min): 14 min Charges:  OT General Charges $OT Visit: 1 Visit OT Evaluation $OT Eval Low Complexity: 1 Low  Kennetta Pavlovic Wells Jalon Blackwelder, OTR/L 12/04/20, 1:58 PM
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
## 2020-12-13 DIAGNOSIS — F32A Depression, unspecified: Secondary | ICD-10-CM | POA: Diagnosis not present

## 2020-12-13 DIAGNOSIS — N1832 Chronic kidney disease, stage 3b: Secondary | ICD-10-CM | POA: Diagnosis not present

## 2020-12-13 DIAGNOSIS — E662 Morbid (severe) obesity with alveolar hypoventilation: Secondary | ICD-10-CM | POA: Diagnosis not present

## 2020-12-13 DIAGNOSIS — G894 Chronic pain syndrome: Secondary | ICD-10-CM | POA: Diagnosis not present

## 2020-12-13 DIAGNOSIS — G4733 Obstructive sleep apnea (adult) (pediatric): Secondary | ICD-10-CM | POA: Diagnosis not present

## 2020-12-13 DIAGNOSIS — J449 Chronic obstructive pulmonary disease, unspecified: Secondary | ICD-10-CM | POA: Diagnosis not present

## 2020-12-13 DIAGNOSIS — I1310 Hypertensive heart and chronic kidney disease without heart failure, with stage 1 through stage 4 chronic kidney disease, or unspecified chronic kidney disease: Secondary | ICD-10-CM | POA: Diagnosis not present

## 2020-12-13 DIAGNOSIS — Z6836 Body mass index (BMI) 36.0-36.9, adult: Secondary | ICD-10-CM | POA: Diagnosis not present

## 2020-12-13 DIAGNOSIS — Z96643 Presence of artificial hip joint, bilateral: Secondary | ICD-10-CM | POA: Diagnosis not present

## 2020-12-16 ENCOUNTER — Encounter: Payer: Self-pay | Admitting: Family Medicine

## 2020-12-16 ENCOUNTER — Other Ambulatory Visit: Payer: Self-pay

## 2020-12-16 ENCOUNTER — Ambulatory Visit (INDEPENDENT_AMBULATORY_CARE_PROVIDER_SITE_OTHER): Payer: Medicare HMO | Admitting: Family Medicine

## 2020-12-16 VITALS — BP 132/84 | HR 85 | Ht 74.0 in | Wt 285.0 lb

## 2020-12-16 DIAGNOSIS — G8929 Other chronic pain: Secondary | ICD-10-CM | POA: Diagnosis not present

## 2020-12-16 DIAGNOSIS — J441 Chronic obstructive pulmonary disease with (acute) exacerbation: Secondary | ICD-10-CM

## 2020-12-16 DIAGNOSIS — M25562 Pain in left knee: Secondary | ICD-10-CM

## 2020-12-16 MED ORDER — MELOXICAM 15 MG PO TABS: 15.0000 mg | ORAL_TABLET | Freq: Every day | ORAL | 0 refills | Status: DC

## 2020-12-16 MED ORDER — ALBUTEROL SULFATE HFA 108 (90 BASE) MCG/ACT IN AERS: 2.0000 | INHALATION_SPRAY | Freq: Four times a day (QID) | RESPIRATORY_TRACT | 0 refills | Status: DC | PRN

## 2020-12-20 DIAGNOSIS — I1310 Hypertensive heart and chronic kidney disease without heart failure, with stage 1 through stage 4 chronic kidney disease, or unspecified chronic kidney disease: Secondary | ICD-10-CM | POA: Diagnosis not present

## 2020-12-20 DIAGNOSIS — F32A Depression, unspecified: Secondary | ICD-10-CM | POA: Diagnosis not present

## 2020-12-20 DIAGNOSIS — J449 Chronic obstructive pulmonary disease, unspecified: Secondary | ICD-10-CM | POA: Diagnosis not present

## 2020-12-20 DIAGNOSIS — N1832 Chronic kidney disease, stage 3b: Secondary | ICD-10-CM | POA: Diagnosis not present

## 2020-12-20 DIAGNOSIS — Z96643 Presence of artificial hip joint, bilateral: Secondary | ICD-10-CM | POA: Diagnosis not present

## 2020-12-20 DIAGNOSIS — E662 Morbid (severe) obesity with alveolar hypoventilation: Secondary | ICD-10-CM | POA: Diagnosis not present

## 2020-12-20 DIAGNOSIS — G894 Chronic pain syndrome: Secondary | ICD-10-CM | POA: Diagnosis not present

## 2020-12-20 DIAGNOSIS — G4733 Obstructive sleep apnea (adult) (pediatric): Secondary | ICD-10-CM | POA: Diagnosis not present

## 2020-12-20 DIAGNOSIS — Z6836 Body mass index (BMI) 36.0-36.9, adult: Secondary | ICD-10-CM | POA: Diagnosis not present

## 2020-12-22 DIAGNOSIS — N1832 Chronic kidney disease, stage 3b: Secondary | ICD-10-CM | POA: Diagnosis not present

## 2020-12-22 DIAGNOSIS — Z96643 Presence of artificial hip joint, bilateral: Secondary | ICD-10-CM | POA: Diagnosis not present

## 2020-12-22 DIAGNOSIS — G4733 Obstructive sleep apnea (adult) (pediatric): Secondary | ICD-10-CM | POA: Diagnosis not present

## 2020-12-22 DIAGNOSIS — E662 Morbid (severe) obesity with alveolar hypoventilation: Secondary | ICD-10-CM | POA: Diagnosis not present

## 2020-12-22 DIAGNOSIS — G894 Chronic pain syndrome: Secondary | ICD-10-CM | POA: Diagnosis not present

## 2020-12-22 DIAGNOSIS — I1310 Hypertensive heart and chronic kidney disease without heart failure, with stage 1 through stage 4 chronic kidney disease, or unspecified chronic kidney disease: Secondary | ICD-10-CM | POA: Diagnosis not present

## 2020-12-22 DIAGNOSIS — Z6836 Body mass index (BMI) 36.0-36.9, adult: Secondary | ICD-10-CM | POA: Diagnosis not present

## 2020-12-22 DIAGNOSIS — J449 Chronic obstructive pulmonary disease, unspecified: Secondary | ICD-10-CM | POA: Diagnosis not present

## 2020-12-22 DIAGNOSIS — F32A Depression, unspecified: Secondary | ICD-10-CM | POA: Diagnosis not present

## 2020-12-28 ENCOUNTER — Other Ambulatory Visit: Payer: Self-pay | Admitting: Internal Medicine

## 2020-12-28 DIAGNOSIS — G4733 Obstructive sleep apnea (adult) (pediatric): Secondary | ICD-10-CM | POA: Diagnosis not present

## 2020-12-28 DIAGNOSIS — J449 Chronic obstructive pulmonary disease, unspecified: Secondary | ICD-10-CM | POA: Diagnosis not present

## 2020-12-28 DIAGNOSIS — Z96643 Presence of artificial hip joint, bilateral: Secondary | ICD-10-CM | POA: Diagnosis not present

## 2020-12-28 DIAGNOSIS — G894 Chronic pain syndrome: Secondary | ICD-10-CM | POA: Diagnosis not present

## 2020-12-28 DIAGNOSIS — N1832 Chronic kidney disease, stage 3b: Secondary | ICD-10-CM | POA: Diagnosis not present

## 2020-12-28 DIAGNOSIS — I1310 Hypertensive heart and chronic kidney disease without heart failure, with stage 1 through stage 4 chronic kidney disease, or unspecified chronic kidney disease: Secondary | ICD-10-CM | POA: Diagnosis not present

## 2020-12-28 DIAGNOSIS — E662 Morbid (severe) obesity with alveolar hypoventilation: Secondary | ICD-10-CM | POA: Diagnosis not present

## 2020-12-28 DIAGNOSIS — Z6836 Body mass index (BMI) 36.0-36.9, adult: Secondary | ICD-10-CM | POA: Diagnosis not present

## 2020-12-28 DIAGNOSIS — F32A Depression, unspecified: Secondary | ICD-10-CM | POA: Diagnosis not present

## 2021-01-12 ENCOUNTER — Other Ambulatory Visit: Payer: Self-pay | Admitting: Family Medicine

## 2021-01-17 ENCOUNTER — Other Ambulatory Visit: Payer: Self-pay | Admitting: Family Medicine

## 2021-01-26 ENCOUNTER — Encounter: Payer: Self-pay | Admitting: Orthopedic Surgery

## 2021-02-10 ENCOUNTER — Encounter: Payer: Self-pay | Admitting: Family Medicine

## 2021-02-10 NOTE — Progress Notes (Signed)
Established Patient Office Visit  SUBJECTIVE:  Subjective  Patient ID: Matthew [name], male    DOB: [birth year]  Age: 70 y.o. MRN: [id number]  CC:  Chief Complaint  Patient presents with   Hospitalization Follow-up    HPI Matthew [name] is a 70 y.o. male presenting today for     Past Medical History:  Diagnosis Date   Asthma    Edema    Hip dislocation, bilateral ([place])    Hypertension     Past Surgical History:  Procedure Laterality Date   ABDOMINAL SURGERY     CHOLECYSTECTOMY     COLOSTOMY     COLOSTOMY TAKEDOWN     HERNIA REPAIR     LEFT HEART CATH AND CORONARY ANGIOGRAPHY N/A 10/17/2018   Procedure: LEFT HEART CATH AND CORONARY ANGIOGRAPHY with possible PCI and stent;  Surgeon: Yolonda Kida, MD;  Location: [facility];  Service: Cardiovascular;  Laterality: N/A;   ORIF FEMUR FRACTURE Left 05/19/2020   Procedure: OPEN REDUCTION INTERNAL FIXATION (ORIF) DISTAL FEMUR FRACTURE;  Surgeon: Shona Needles, MD;  Location: [facility];  Service: Orthopedics;  Laterality: Left;   TOTAL HIP ARTHROPLASTY Bilateral     Family History  Problem Relation Age of Onset   COPD Mother    Melanoma Father     Social History   Socioeconomic History   Marital status: Married    Spouse name: Not on file   Number of children: Not on file   Years of education: Not on file   Highest education level: Not on file  Occupational History   Not on file  Tobacco Use   Smoking status: Never   Smokeless tobacco: Never  Vaping Use   Vaping Use: Never used  Substance and Sexual Activity   Alcohol use: Not Currently   Drug use: Not on file   Sexual activity: Not on file  Other Topics Concern   Not on file  Social History Narrative   Not on file   Social Determinants of Health   Financial Resource Strain: Not on file  Food Insecurity: Not on file  Transportation Needs: Not on file  Physical Activity: Not on file  Stress: Not on file  Social Connections: Not on file   Intimate Partner Violence: Not on file     Current Outpatient Medications:    albuterol (VENTOLIN HFA) 108 (90 Base) MCG/ACT inhaler, Inhale 2 puffs into the lungs every 6 (six) hours as needed for wheezing or shortness of breath., Disp: 8 g, Rfl: 2   allopurinol (ZYLOPRIM) 100 MG tablet, Take 1 tablet (100 mg total) by mouth daily. (Patient taking differently: Take 100 mg by mouth as needed (gout).), Disp: 10 tablet, Rfl: 0   amLODipine (NORVASC) 10 MG tablet, Take 1 tablet (10 mg total) by mouth daily., Disp: , Rfl:    DULoxetine (CYMBALTA) 60 MG capsule, TAKE 1 CAPSULE BY MOUTH TWICE DAILY, Disp: 60 capsule, Rfl: 6   gabapentin (NEURONTIN) 300 MG capsule, Take 1 capsule (300 mg total) by mouth 2 (two) times daily., Disp: 6 capsule, Rfl: 0   methocarbamol (ROBAXIN) 500 MG tablet, Take 1 tablet (500 mg total) by mouth every 6 (six) hours as needed for muscle spasms., Disp: 25 tablet, Rfl: 1   polyethylene glycol (MIRALAX / GLYCOLAX) 17 g packet, Take 17 g by mouth daily as needed for mild constipation., Disp: 14 each, Rfl: 0   SEREVENT DISKUS 50 MCG/DOSE diskus inhaler, Inhale 1 puff into the  lungs 2 (two) times daily., Disp: 1 Inhaler, Rfl: 6   albuterol (VENTOLIN HFA) 108 (90 Base) MCG/ACT inhaler, INHALE 1 PUFF BY MOUTH INTO THE LUNGS DAILY, Disp: 18 g, Rfl: 3   albuterol (VENTOLIN HFA) 108 (90 Base) MCG/ACT inhaler, TAKE 2 PUFFS BY MOUTH EVERY 6 HOURS AS NEEDED FOR WHEEZE OR SHORTNESS OF BREATH, Disp: 8.5 each, Rfl: 5   meloxicam (MOBIC) 15 MG tablet, TAKE 1 TABLET (15 MG TOTAL) BY MOUTH DAILY., Disp: 30 tablet, Rfl: 0   Allergies  Allergen Reactions   Demerol [Meperidine Hcl]     ROS Review of Systems  Constitutional: Negative.   HENT: Negative.    Respiratory:  Positive for shortness of breath and wheezing.   Cardiovascular: Negative.   Genitourinary: Negative.   Musculoskeletal:  Positive for arthralgias.  Psychiatric/Behavioral: Negative.      OBJECTIVE:    Physical  Exam HENT:     Right Ear: Tympanic membrane normal.     Left Ear: Tympanic membrane normal.  Cardiovascular:     Rate and Rhythm: Normal rate.  Musculoskeletal:        General: Normal range of motion.  Skin:    General: Skin is warm.  Neurological:     Mental Status: He is alert.  Psychiatric:        Mood and Affect: Mood normal.    BP 132/84   Pulse 85   Ht 6\' 2"  (1.88 m)   Wt 285 lb (129.3 kg)   BMI 36.59 kg/m  Wt Readings from Last 3 Encounters:  12/16/20 285 lb (129.3 kg)  12/03/20 284 lb 6.3 oz (129 kg)  11/05/20 285 lb (129.3 kg)    Health Maintenance Due  Topic Date Due   COVID-19 Vaccine (1) Never done   Hepatitis C Screening  Never done   TETANUS/TDAP  Never done   Zoster Vaccines- Shingrix (1 of 2) Never done   COLONOSCOPY (Pts 45-31yrs Insurance coverage will need to be confirmed)  Never done   PNA vac Low Risk Adult (1 of 2 - PCV13) Never done    There are no preventive care reminders to display for this patient.  CBC Latest Ref Rng & Units 12/04/2020 12/03/2020 11/05/2020  WBC 4.0 - 10.5 K/uL 7.9 10.4 7.3  Hemoglobin 13.0 - 17.0 g/dL 13.4 14.5 14.2  Hematocrit 39.0 - 52.0 % 40.0 44.2 43.1  Platelets 150 - 400 K/uL 293 303 250   CMP Latest Ref Rng & Units 12/04/2020 12/03/2020 11/05/2020  Glucose 70 - 99 mg/dL - 169(H) 111(H)  BUN 8 - 23 mg/dL - 31(H) 21  Creatinine 0.61 - 1.24 mg/dL 1.81(H) 1.84(H) 1.41(H)  Sodium 135 - 145 mmol/L - 139 138  Potassium 3.5 - 5.1 mmol/L - 4.4 4.3  Chloride 98 - 111 mmol/L - 105 108  CO2 22 - 32 mmol/L - 25 24  Calcium 8.9 - 10.3 mg/dL - 10.7(H) 9.8  Total Protein 6.5 - 8.1 g/dL - - -  Total Bilirubin 0.3 - 1.2 mg/dL - - -  Alkaline Phos 38 - 126 U/L - - -  AST 15 - 41 U/L - - -  ALT 0 - 44 U/L - - -    No results found for: TSH Lab Results  Component Value Date   ALBUMIN 2.9 (L) 08/13/2020   ANIONGAP 9 12/03/2020   Lab Results  Component Value Date   CHOL 110 10/16/2018   CHOL 108 09/22/2012   HDL 51  10/16/2018   HDL  43 09/22/2012   LDLCALC 50 10/16/2018   LDLCALC 54 09/22/2012   CHOLHDL 2.2 10/16/2018   Lab Results  Component Value Date   TRIG 43 10/16/2018   Lab Results  Component Value Date   HGBA1C 6.0 09/22/2012      ASSESSMENT & PLAN:   Problem List Items Addressed This Visit       Respiratory   COPD exacerbation ([place]) - Primary    Pt following up from hospital admission COPD, doing well today. He also had R hip pain, had ED observation admission. Stable today, no change in meds.          Other   Chronic pain of left knee   Relevant Orders   Ambulatory referral to Orthopedics    Meds ordered this encounter  Medications   DISCONTD: meloxicam (MOBIC) 15 MG tablet    Sig: Take 1 tablet (15 mg total) by mouth daily.    Dispense:  30 tablet    Refill:  0   DISCONTD: albuterol (VENTOLIN HFA) 108 (90 Base) MCG/ACT inhaler    Sig: Inhale 2 puffs into the lungs every 6 (six) hours as needed for wheezing or shortness of breath.    Dispense:  8 g    Refill:  0      Follow-up: No follow-ups on file.    Beckie Salts, [place] [address], [place], [place] [address]

## 2021-02-10 NOTE — Assessment & Plan Note (Signed)
Pt following up from hospital admission COPD, doing well today. He also had R hip pain, had ED observation admission. Stable today, no change in meds.

## 2021-02-11 ENCOUNTER — Encounter: Payer: Self-pay | Admitting: Orthopaedic Surgery

## 2021-02-16 ENCOUNTER — Other Ambulatory Visit: Payer: Self-pay | Admitting: Internal Medicine

## 2021-03-17 ENCOUNTER — Other Ambulatory Visit: Payer: Self-pay | Admitting: Internal Medicine

## 2021-03-18 ENCOUNTER — Encounter: Payer: Medicare HMO | Admitting: Family Medicine

## 2021-04-27 ENCOUNTER — Other Ambulatory Visit: Payer: Self-pay | Admitting: Internal Medicine

## 2021-06-12 ENCOUNTER — Emergency Department: Payer: Medicare HMO

## 2021-06-12 ENCOUNTER — Inpatient Hospital Stay
Admission: EM | Admit: 2021-06-12 | Discharge: 2021-06-23 | DRG: 308 | Disposition: A | Discharge status: Skilled Nursing Facility | Payer: Medicare HMO | Attending: Student in an Organized Health Care Education/Training Program | Admitting: Student in an Organized Health Care Education/Training Program

## 2021-06-12 ENCOUNTER — Encounter: Payer: Self-pay | Admitting: Emergency Medicine

## 2021-06-12 ENCOUNTER — Other Ambulatory Visit: Payer: Self-pay

## 2021-06-12 DIAGNOSIS — Z885 Allergy status to narcotic agent status: Secondary | ICD-10-CM

## 2021-06-12 DIAGNOSIS — R600 Localized edema: Secondary | ICD-10-CM

## 2021-06-12 DIAGNOSIS — I482 Chronic atrial fibrillation, unspecified: Secondary | ICD-10-CM | POA: Diagnosis not present

## 2021-06-12 DIAGNOSIS — Z6838 Body mass index (BMI) 38.0-38.9, adult: Secondary | ICD-10-CM

## 2021-06-12 DIAGNOSIS — N1832 Chronic kidney disease, stage 3b: Secondary | ICD-10-CM | POA: Diagnosis not present

## 2021-06-12 DIAGNOSIS — E6609 Other obesity due to excess calories: Secondary | ICD-10-CM

## 2021-06-12 DIAGNOSIS — N189 Chronic kidney disease, unspecified: Secondary | ICD-10-CM | POA: Diagnosis not present

## 2021-06-12 DIAGNOSIS — S2231XA Fracture of one rib, right side, initial encounter for closed fracture: Secondary | ICD-10-CM | POA: Diagnosis not present

## 2021-06-12 DIAGNOSIS — I5031 Acute diastolic (congestive) heart failure: Secondary | ICD-10-CM | POA: Diagnosis not present

## 2021-06-12 DIAGNOSIS — I130 Hypertensive heart and chronic kidney disease with heart failure and stage 1 through stage 4 chronic kidney disease, or unspecified chronic kidney disease: Secondary | ICD-10-CM | POA: Diagnosis not present

## 2021-06-12 DIAGNOSIS — E875 Hyperkalemia: Secondary | ICD-10-CM | POA: Diagnosis present

## 2021-06-12 DIAGNOSIS — E669 Obesity, unspecified: Secondary | ICD-10-CM

## 2021-06-12 DIAGNOSIS — L98499 Non-pressure chronic ulcer of skin of other sites with unspecified severity: Secondary | ICD-10-CM | POA: Diagnosis present

## 2021-06-12 DIAGNOSIS — I959 Hypotension, unspecified: Secondary | ICD-10-CM | POA: Diagnosis not present

## 2021-06-12 DIAGNOSIS — D631 Anemia in chronic kidney disease: Secondary | ICD-10-CM | POA: Diagnosis present

## 2021-06-12 DIAGNOSIS — N2581 Secondary hyperparathyroidism of renal origin: Secondary | ICD-10-CM | POA: Diagnosis present

## 2021-06-12 DIAGNOSIS — I10 Essential (primary) hypertension: Secondary | ICD-10-CM | POA: Diagnosis not present

## 2021-06-12 DIAGNOSIS — M109 Gout, unspecified: Secondary | ICD-10-CM

## 2021-06-12 DIAGNOSIS — Z79899 Other long term (current) drug therapy: Secondary | ICD-10-CM

## 2021-06-12 DIAGNOSIS — Z20822 Contact with and (suspected) exposure to covid-19: Secondary | ICD-10-CM | POA: Diagnosis not present

## 2021-06-12 DIAGNOSIS — J441 Chronic obstructive pulmonary disease with (acute) exacerbation: Secondary | ICD-10-CM | POA: Diagnosis not present

## 2021-06-12 DIAGNOSIS — R739 Hyperglycemia, unspecified: Secondary | ICD-10-CM | POA: Diagnosis present

## 2021-06-12 DIAGNOSIS — N281 Cyst of kidney, acquired: Secondary | ICD-10-CM | POA: Diagnosis not present

## 2021-06-12 DIAGNOSIS — R0602 Shortness of breath: Secondary | ICD-10-CM | POA: Diagnosis present

## 2021-06-12 DIAGNOSIS — E871 Hypo-osmolality and hyponatremia: Secondary | ICD-10-CM | POA: Diagnosis not present

## 2021-06-12 DIAGNOSIS — E538 Deficiency of other specified B group vitamins: Secondary | ICD-10-CM | POA: Diagnosis present

## 2021-06-12 DIAGNOSIS — J9601 Acute respiratory failure with hypoxia: Secondary | ICD-10-CM | POA: Diagnosis not present

## 2021-06-12 DIAGNOSIS — Z825 Family history of asthma and other chronic lower respiratory diseases: Secondary | ICD-10-CM | POA: Diagnosis not present

## 2021-06-12 DIAGNOSIS — R531 Weakness: Secondary | ICD-10-CM | POA: Diagnosis not present

## 2021-06-12 DIAGNOSIS — D7589 Other specified diseases of blood and blood-forming organs: Secondary | ICD-10-CM | POA: Diagnosis not present

## 2021-06-12 DIAGNOSIS — Z888 Allergy status to other drugs, medicaments and biological substances status: Secondary | ICD-10-CM | POA: Diagnosis not present

## 2021-06-12 DIAGNOSIS — N179 Acute kidney failure, unspecified: Secondary | ICD-10-CM | POA: Diagnosis not present

## 2021-06-12 DIAGNOSIS — R601 Generalized edema: Secondary | ICD-10-CM | POA: Diagnosis not present

## 2021-06-12 DIAGNOSIS — R7309 Other abnormal glucose: Secondary | ICD-10-CM | POA: Diagnosis not present

## 2021-06-12 DIAGNOSIS — R52 Pain, unspecified: Secondary | ICD-10-CM

## 2021-06-12 DIAGNOSIS — Z791 Long term (current) use of non-steroidal anti-inflammatories (NSAID): Secondary | ICD-10-CM

## 2021-06-12 DIAGNOSIS — M10071 Idiopathic gout, right ankle and foot: Secondary | ICD-10-CM | POA: Diagnosis present

## 2021-06-12 DIAGNOSIS — J4541 Moderate persistent asthma with (acute) exacerbation: Secondary | ICD-10-CM | POA: Diagnosis not present

## 2021-06-12 DIAGNOSIS — N170 Acute kidney failure with tubular necrosis: Secondary | ICD-10-CM | POA: Diagnosis not present

## 2021-06-12 DIAGNOSIS — I5021 Acute systolic (congestive) heart failure: Secondary | ICD-10-CM | POA: Diagnosis not present

## 2021-06-12 DIAGNOSIS — I5022 Chronic systolic (congestive) heart failure: Secondary | ICD-10-CM | POA: Diagnosis not present

## 2021-06-12 DIAGNOSIS — M79604 Pain in right leg: Secondary | ICD-10-CM | POA: Diagnosis not present

## 2021-06-12 DIAGNOSIS — K5900 Constipation, unspecified: Secondary | ICD-10-CM | POA: Diagnosis not present

## 2021-06-12 DIAGNOSIS — R000 Tachycardia, unspecified: Secondary | ICD-10-CM | POA: Diagnosis not present

## 2021-06-12 DIAGNOSIS — T380X5A Adverse effect of glucocorticoids and synthetic analogues, initial encounter: Secondary | ICD-10-CM | POA: Diagnosis present

## 2021-06-12 DIAGNOSIS — R0689 Other abnormalities of breathing: Secondary | ICD-10-CM | POA: Diagnosis not present

## 2021-06-12 DIAGNOSIS — Z96643 Presence of artificial hip joint, bilateral: Secondary | ICD-10-CM | POA: Diagnosis present

## 2021-06-12 DIAGNOSIS — I499 Cardiac arrhythmia, unspecified: Secondary | ICD-10-CM | POA: Diagnosis not present

## 2021-06-12 DIAGNOSIS — I4891 Unspecified atrial fibrillation: Secondary | ICD-10-CM | POA: Diagnosis not present

## 2021-06-12 DIAGNOSIS — M79671 Pain in right foot: Secondary | ICD-10-CM | POA: Diagnosis not present

## 2021-06-12 DIAGNOSIS — I517 Cardiomegaly: Secondary | ICD-10-CM | POA: Diagnosis not present

## 2021-06-12 DIAGNOSIS — M7989 Other specified soft tissue disorders: Secondary | ICD-10-CM | POA: Diagnosis not present

## 2021-06-12 DIAGNOSIS — J9621 Acute and chronic respiratory failure with hypoxia: Secondary | ICD-10-CM | POA: Diagnosis present

## 2021-06-12 DIAGNOSIS — J9691 Respiratory failure, unspecified with hypoxia: Secondary | ICD-10-CM | POA: Diagnosis present

## 2021-06-12 DIAGNOSIS — I4892 Unspecified atrial flutter: Secondary | ICD-10-CM | POA: Diagnosis present

## 2021-06-12 DIAGNOSIS — J80 Acute respiratory distress syndrome: Secondary | ICD-10-CM | POA: Diagnosis not present

## 2021-06-12 DIAGNOSIS — J90 Pleural effusion, not elsewhere classified: Secondary | ICD-10-CM | POA: Diagnosis not present

## 2021-06-12 DIAGNOSIS — R3911 Hesitancy of micturition: Secondary | ICD-10-CM | POA: Diagnosis present

## 2021-06-12 DIAGNOSIS — R062 Wheezing: Secondary | ICD-10-CM | POA: Diagnosis not present

## 2021-06-12 DIAGNOSIS — Z7401 Bed confinement status: Secondary | ICD-10-CM | POA: Diagnosis not present

## 2021-06-12 DIAGNOSIS — J449 Chronic obstructive pulmonary disease, unspecified: Secondary | ICD-10-CM

## 2021-06-12 LAB — RESP PANEL BY RT-PCR (FLU A&B, COVID) ARPGX2
Influenza A by PCR: NEGATIVE
Influenza B by PCR: NEGATIVE
SARS Coronavirus 2 by RT PCR: NEGATIVE

## 2021-06-12 LAB — CBC WITH DIFFERENTIAL/PLATELET
Abs Immature Granulocytes: 0.24 10*3/uL — ABNORMAL HIGH (ref 0.00–0.07)
Basophils Absolute: 0.1 10*3/uL (ref 0.0–0.1)
Basophils Relative: 1 %
Eosinophils Absolute: 0 10*3/uL (ref 0.0–0.5)
Eosinophils Relative: 0 %
HCT: 41.6 % (ref 39.0–52.0)
Hemoglobin: 13.7 g/dL (ref 13.0–17.0)
Immature Granulocytes: 1 %
Lymphocytes Relative: 10 %
Lymphs Abs: 1.9 10*3/uL (ref 0.7–4.0)
MCH: 34.4 pg — ABNORMAL HIGH (ref 26.0–34.0)
MCHC: 32.9 g/dL (ref 30.0–36.0)
MCV: 104.5 fL — ABNORMAL HIGH (ref 80.0–100.0)
Monocytes Absolute: 1 10*3/uL (ref 0.1–1.0)
Monocytes Relative: 6 %
Neutro Abs: 15.2 10*3/uL — ABNORMAL HIGH (ref 1.7–7.7)
Neutrophils Relative %: 82 %
Platelets: 482 10*3/uL — ABNORMAL HIGH (ref 150–400)
RBC: 3.98 MIL/uL — ABNORMAL LOW (ref 4.22–5.81)
RDW: 13.2 % (ref 11.5–15.5)
WBC: 18.4 10*3/uL — ABNORMAL HIGH (ref 4.0–10.5)
nRBC: 0 % (ref 0.0–0.2)

## 2021-06-12 LAB — LACTIC ACID, PLASMA: Lactic Acid, Venous: 1.6 mmol/L (ref 0.5–1.9)

## 2021-06-12 LAB — COMPREHENSIVE METABOLIC PANEL
ALT: 17 U/L (ref 0–44)
AST: 25 U/L (ref 15–41)
Albumin: 2.4 g/dL — ABNORMAL LOW (ref 3.5–5.0)
Alkaline Phosphatase: 92 U/L (ref 38–126)
Anion gap: 6 (ref 5–15)
BUN: 44 mg/dL — ABNORMAL HIGH (ref 8–23)
CO2: 24 mmol/L (ref 22–32)
Calcium: 9.3 mg/dL (ref 8.9–10.3)
Chloride: 107 mmol/L (ref 98–111)
Creatinine, Ser: 2.62 mg/dL — ABNORMAL HIGH (ref 0.61–1.24)
GFR, Estimated: 25 mL/min — ABNORMAL LOW (ref >60)
Glucose, Bld: 128 mg/dL — ABNORMAL HIGH (ref 70–99)
Potassium: 5.6 mmol/L — ABNORMAL HIGH (ref 3.5–5.1)
Sodium: 137 mmol/L (ref 135–145)
Total Bilirubin: 1.3 mg/dL — ABNORMAL HIGH (ref 0.3–1.2)
Total Protein: 7.8 g/dL (ref 6.5–8.1)

## 2021-06-12 LAB — TROPONIN I (HIGH SENSITIVITY)
Troponin I (High Sensitivity): 24 ng/L — ABNORMAL HIGH (ref <18)
Troponin I (High Sensitivity): 21 ng/L — ABNORMAL HIGH (ref <18)

## 2021-06-12 LAB — BRAIN NATRIURETIC PEPTIDE: B Natriuretic Peptide: 279.6 pg/mL — ABNORMAL HIGH (ref 0.0–100.0)

## 2021-06-12 MED ORDER — HEPARIN SODIUM (PORCINE) 5000 UNIT/ML IJ SOLN
5000.0000 [IU] | Freq: Three times a day (TID) | INTRAMUSCULAR | Status: DC
  Administered 2021-06-13 (×3): 5000 [IU] via SUBCUTANEOUS
  Filled 2021-06-12 (×3): qty 1

## 2021-06-12 MED ORDER — FUROSEMIDE 10 MG/ML IJ SOLN
40.0000 mg | Freq: Once | INTRAMUSCULAR | Status: AC
  Administered 2021-06-12: 40 mg via INTRAVENOUS
  Filled 2021-06-12: qty 4

## 2021-06-12 MED ORDER — NICOTINE 14 MG/24HR TD PT24
14.0000 mg | MEDICATED_PATCH | Freq: Every day | TRANSDERMAL | Status: DC
  Administered 2021-06-13: 14 mg via TRANSDERMAL
  Filled 2021-06-12 (×3): qty 1

## 2021-06-12 MED ORDER — ACETAMINOPHEN 500 MG PO TABS
1000.0000 mg | ORAL_TABLET | Freq: Once | ORAL | Status: AC
  Administered 2021-06-12: 1000 mg via ORAL
  Filled 2021-06-12: qty 2

## 2021-06-12 MED ORDER — DOCUSATE SODIUM 100 MG PO CAPS
100.0000 mg | ORAL_CAPSULE | Freq: Two times a day (BID) | ORAL | Status: DC
  Administered 2021-06-13 – 2021-06-22 (×20): 100 mg via ORAL
  Filled 2021-06-12 (×20): qty 1

## 2021-06-12 MED ORDER — ACETAMINOPHEN 325 MG PO TABS
650.0000 mg | ORAL_TABLET | Freq: Four times a day (QID) | ORAL | Status: DC | PRN
  Administered 2021-06-16 (×2): 650 mg via ORAL
  Filled 2021-06-12 (×2): qty 2

## 2021-06-12 MED ORDER — HYDRALAZINE HCL 20 MG/ML IJ SOLN: 5.0000 mg | Freq: Four times a day (QID) | INTRAMUSCULAR | Status: DC | PRN

## 2021-06-12 MED ORDER — ALBUTEROL SULFATE (2.5 MG/3ML) 0.083% IN NEBU
2.5000 mg | INHALATION_SOLUTION | Freq: Four times a day (QID) | RESPIRATORY_TRACT | Status: DC | PRN
  Administered 2021-06-13 – 2021-06-19 (×8): 2.5 mg via RESPIRATORY_TRACT
  Filled 2021-06-12 (×8): qty 3

## 2021-06-12 MED ORDER — ALBUTEROL SULFATE (2.5 MG/3ML) 0.083% IN NEBU
2.5000 mg | INHALATION_SOLUTION | Freq: Once | RESPIRATORY_TRACT | Status: AC
  Administered 2021-06-12: 2.5 mg via RESPIRATORY_TRACT
  Filled 2021-06-12: qty 3

## 2021-06-12 MED ORDER — ASPIRIN EC 81 MG PO TBEC
81.0000 mg | DELAYED_RELEASE_TABLET | Freq: Every day | ORAL | Status: DC
  Administered 2021-06-13 – 2021-06-23 (×12): 81 mg via ORAL
  Filled 2021-06-12 (×12): qty 1

## 2021-06-12 MED ORDER — POLYETHYLENE GLYCOL 3350 17 G PO PACK: 17.0000 g | PACK | Freq: Every day | ORAL | Status: DC | PRN

## 2021-06-12 MED ORDER — DILTIAZEM HCL 25 MG/5ML IV SOLN
20.0000 mg | Freq: Once | INTRAVENOUS | Status: AC
  Administered 2021-06-12 (×2): 10 mg via INTRAVENOUS
  Filled 2021-06-12: qty 5

## 2021-06-12 MED ORDER — BISACODYL 5 MG PO TBEC: 5.0000 mg | DELAYED_RELEASE_TABLET | Freq: Every day | ORAL | Status: DC | PRN

## 2021-06-12 MED ORDER — SODIUM CHLORIDE 0.9 % IV BOLUS
1000.0000 mL | Freq: Once | INTRAVENOUS | Status: AC
  Administered 2021-06-12: 1000 mL via INTRAVENOUS

## 2021-06-12 MED ORDER — ACETAMINOPHEN 650 MG RE SUPP
650.0000 mg | Freq: Four times a day (QID) | RECTAL | Status: DC | PRN
  Filled 2021-06-12: qty 1

## 2021-06-12 MED ORDER — SODIUM CHLORIDE 0.9% FLUSH
3.0000 mL | Freq: Two times a day (BID) | INTRAVENOUS | Status: DC
  Administered 2021-06-13 – 2021-06-23 (×17): 3 mL via INTRAVENOUS

## 2021-06-12 MED ORDER — ALBUTEROL SULFATE HFA 108 (90 BASE) MCG/ACT IN AERS: 2.0000 | INHALATION_SPRAY | Freq: Four times a day (QID) | RESPIRATORY_TRACT | Status: DC | PRN

## 2021-06-12 MED ORDER — FUROSEMIDE 10 MG/ML IJ SOLN: 20.0000 mg | Freq: Two times a day (BID) | INTRAMUSCULAR | Status: DC

## 2021-06-12 MED ORDER — DILTIAZEM HCL-DEXTROSE 125-5 MG/125ML-% IV SOLN (PREMIX)
5.0000 mg/h | INTRAVENOUS | Status: DC
  Administered 2021-06-12: 5 mg/h via INTRAVENOUS
  Administered 2021-06-13: 15 mg/h via INTRAVENOUS
  Filled 2021-06-12: qty 125

## 2021-06-12 NOTE — ED Notes (Signed)
Spoke with Dr. Starleen Blue, will administer 10 mg IVP cardizem at this time and will administer additional 10 mg IVP if needed.

## 2021-06-12 NOTE — ED Provider Notes (Signed)
[facility]  ____________________________________________   Event Date/Time   First MD Initiated Contact with Patient 06/12/21 1830     (approximate)  I have reviewed the triage vital signs and the nursing notes.   HISTORY  Chief Complaint Shortness of Breath (h) and Tachycardia    HPI Matthew [name] is a 70 y.o. male with past medical history of COPD and hypertension who presents with shortness of breath and cough.  Patient is somewhat of a poor historian tells me that these issues have been going on for several years.  Cannot really tell me when his breathing started to get worse.  He does endorse a chronic cough that is nonproductive as well as some generalized chest pain that is nonexertional.  Denies fevers or chills.  Per medics patient was living in a trailer with multiple cups filled of urine that was extremely dark.  Patient tells me he has not smoked in the past, not sure whether he has a diagnosis of COPD or not but does use albuterol and ran out of this, not sure when.  Denies lower extremity edema.  Denies abdominal pain nausea vomiting diarrhea.         Past Medical History:  Diagnosis Date   Asthma    Edema    Hip dislocation, bilateral ([place])    Hypertension     Patient Active Problem List   Diagnosis Date Noted   Chronic pain of left knee 12/16/2020   COPD exacerbation ([place]) 12/04/2020   Acute right hip pain 12/04/2020   H/O bilateral hip replacements 12/04/2020   Stage 3b chronic kidney disease ([place]) 12/04/2020   Displaced comminuted fracture of shaft of left femur, initial encounter for closed fracture ([place]) 05/19/2020   Asthma 05/18/2020   Essential hypertension 05/18/2020   Femur fracture, left ([place]) 05/18/2020   Closed comminuted intra-articular fracture of distal femur ([place]) 05/17/2020   Acute asthma exacerbation 10/15/2018    Past Surgical History:  Procedure Laterality Date   ABDOMINAL SURGERY     CHOLECYSTECTOMY      COLOSTOMY     COLOSTOMY TAKEDOWN     HERNIA REPAIR     LEFT HEART CATH AND CORONARY ANGIOGRAPHY N/A 10/17/2018   Procedure: LEFT HEART CATH AND CORONARY ANGIOGRAPHY with possible PCI and stent;  Surgeon: Yolonda Kida, MD;  Location: [facility];  Service: Cardiovascular;  Laterality: N/A;   ORIF FEMUR FRACTURE Left 05/19/2020   Procedure: OPEN REDUCTION INTERNAL FIXATION (ORIF) DISTAL FEMUR FRACTURE;  Surgeon: Shona Needles, MD;  Location: [facility];  Service: Orthopedics;  Laterality: Left;   TOTAL HIP ARTHROPLASTY Bilateral     Prior to Admission medications   Medication Sig Start Date End Date Taking? Authorizing Provider  albuterol (VENTOLIN HFA) 108 (90 Base) MCG/ACT inhaler Inhale 2 puffs into the lungs every 6 (six) hours as needed for wheezing or shortness of breath. 11/05/20   Alfred Levins, [place], MD  albuterol (VENTOLIN HFA) 108 (90 Base) MCG/ACT inhaler TAKE 2 PUFFS BY MOUTH EVERY 6 HOURS AS NEEDED FOR WHEEZE OR SHORTNESS OF BREATH 01/12/21   [name], MD  albuterol (VENTOLIN HFA) 108 (90 Base) MCG/ACT inhaler INHALE 1 PUFF BY MOUTH INTO THE LUNGS DAILY 04/28/21   [name], MD  allopurinol (ZYLOPRIM) 100 MG tablet Take 1 tablet (100 mg total) by mouth daily. Patient taking differently: Take 100 mg by mouth as needed (gout). 03/22/20   [name], MD  amLODipine (NORVASC) 10 MG tablet Take 1  tablet (10 mg total) by mouth daily. 05/24/20   Nita Sells, MD  DULoxetine (CYMBALTA) 60 MG capsule TAKE 1 CAPSULE BY MOUTH TWICE DAILY 05/27/20   [name], MD  gabapentin (NEURONTIN) 300 MG capsule Take 1 capsule (300 mg total) by mouth 2 (two) times daily. 05/24/20   Nita Sells, MD  lisinopril (ZESTRIL) 10 MG tablet TAKE 1 TABLET BY MOUTH EVERY DAY 02/17/21   [name], MD  meloxicam (MOBIC) 15 MG tablet TAKE 1 TABLET (15 MG TOTAL) BY MOUTH DAILY. 03/17/21   [name], MD  methocarbamol (ROBAXIN) 500 MG tablet Take 1 tablet (500 mg total) by mouth  every 6 (six) hours as needed for muscle spasms. 05/21/20   Haddix, Thomasene Lot, MD  polyethylene glycol (MIRALAX / GLYCOLAX) 17 g packet Take 17 g by mouth daily as needed for mild constipation. 05/24/20   Nita Sells, MD  SEREVENT DISKUS 50 MCG/DOSE diskus inhaler Inhale 1 puff into the lungs 2 (two) times daily. 12/22/19   [name], MD    Allergies Demerol [meperidine hcl]  Family History  Problem Relation Age of Onset   COPD Mother    Melanoma Father     Social History Social History   Tobacco Use   Smoking status: Never   Smokeless tobacco: Never  Vaping Use   Vaping Use: Never used  Substance Use Topics   Alcohol use: Not Currently    Review of Systems   Review of Systems  Constitutional:  Negative for chills and fever.  Respiratory:  Positive for cough, chest tightness and shortness of breath.   Cardiovascular:  Positive for chest pain and palpitations. Negative for leg swelling.  Gastrointestinal:  Negative for abdominal pain, nausea and vomiting.  All other systems reviewed and are negative.  Physical Exam Updated Vital Signs BP 124/87   Pulse 75   Temp 97.9 F (36.6 C) (Oral)   Resp (!) 22   Ht 6\' 2"  (1.88 m)   Wt 136.1 kg   SpO2 98%   BMI 38.52 kg/m   Physical Exam Vitals and nursing note reviewed.  Constitutional:      General: He is not in acute distress.    Appearance: Normal appearance.     Comments: Patient appears disheveled  HENT:     Head: Normocephalic and atraumatic.  Eyes:     General: No scleral icterus.    Conjunctiva/sclera: Conjunctivae normal.  Pulmonary:     Effort: No respiratory distress.     Breath sounds: Wheezing present.     Comments: Increased work of breathing, using abdomen to breathe but able to speak in full sentences Sounds are somewhat diminished I do hear some inspiratory wheezing throughout Musculoskeletal:        General: No deformity or signs of injury.     Cervical back: Normal range of motion.   Skin:    Coloration: Skin is not jaundiced or pale.  Neurological:     General: No focal deficit present.     Mental Status: He is alert and oriented to person, place, and time. Mental status is at baseline.  Psychiatric:        Mood and Affect: Mood normal.        Behavior: Behavior normal.     LABS (all labs ordered are listed, but only abnormal results are displayed)  Labs Reviewed  COMPREHENSIVE METABOLIC PANEL - Abnormal; Notable for the following components:      Result Value   Potassium 5.6 (*)  Glucose, Bld 128 (*)    BUN 44 (*)    Creatinine, Ser 2.62 (*)    Albumin 2.4 (*)    Total Bilirubin 1.3 (*)    GFR, Estimated 25 (*)    All other components within normal limits  CBC WITH DIFFERENTIAL/PLATELET - Abnormal; Notable for the following components:   WBC 18.4 (*)    RBC 3.98 (*)    MCV 104.5 (*)    MCH 34.4 (*)    Platelets 482 (*)    Neutro Abs 15.2 (*)    Abs Immature Granulocytes 0.24 (*)    All other components within normal limits  BRAIN NATRIURETIC PEPTIDE - Abnormal; Notable for the following components:   B Natriuretic Peptide 279.6 (*)    All other components within normal limits  TROPONIN I (HIGH SENSITIVITY) - Abnormal; Notable for the following components:   Troponin I (High Sensitivity) 24 (*)    All other components within normal limits  RESP PANEL BY RT-PCR (FLU A&B, COVID) ARPGX2  CULTURE, BLOOD (ROUTINE X 2)  CULTURE, BLOOD (ROUTINE X 2)  LACTIC ACID, PLASMA  URINALYSIS, COMPLETE (UACMP) WITH MICROSCOPIC  LACTIC ACID, PLASMA  TROPONIN I (HIGH SENSITIVITY)   ____________________________________________  EKG  Atrial fibrillation with rapid ventricular rate, T wave inversions in the lateral precordial leads ____________________________________________  RADIOLOGY I, Madelin Headings, personally viewed and evaluated these images (plain radiographs) as part of my medical decision making, as well as reviewing the written report by the  radiologist.  ED MD interpretation:  I reviewed the CXR which does not show any acute cardiopulmonary process      ____________________________________________   PROCEDURES  Procedure(s) performed (including Critical Care):  .Critical Care Performed by: Rada Hay, MD Authorized by: Rada Hay, MD   Critical care provider statement:    Critical care time (minutes):  45   Critical care was necessary to treat or prevent imminent or life-threatening deterioration of the following conditions:  Circulatory failure   Critical care was time spent personally by me on the following activities:  Development of treatment plan with patient or surrogate, ordering and performing treatments and interventions, ordering and review of laboratory studies, ordering and review of radiographic studies, pulse oximetry, re-evaluation of patient's condition and review of old charts   I assumed direction of critical care for this patient from another provider in my specialty: no     Care discussed with: admitting provider     ____________________________________________   INITIAL IMPRESSION / [place] / ED COURSE     Patient is a 70 year old male presenting with shortness of breath.  He was found to be mildly hypoxic with EMS and tachycardic to the 160s with A. fib.  He received 5 mg of metoprolol in the field with some improvement in his heart rate.  Also received Solu-Medrol and albuterol inhalers.  On arrival to the ED overall he appears disheveled and chronically ill but not in acute distress.  Does have mildly increased work of breathing with decreased air movement throughout.  His heart rate is in the 150s and looks like A. fib with RVR with some T wave inversions.  Patient has no history of A. fib.  Given 10 mg of diltiazem and then started on a dill drip.  His labs are notable for a leukocytosis of 18, elevated troponin to 24 and AKI with this creatinine around 2.4.  His  chest x-ray does not show any focal infiltrate.  We will  send lactate blood cultures and urine to rule out other sources of infection.  Right now he is meeting sepsis criteria with a tachycardia, apnea and leukocytosis but I do not have a source.  We will continue to monitor.  Lactate is within normal limits.  Patient currently on 2 L nasal cannula saturating well.  He is on the diltiazem drip still tachycardic.  We are still waiting on a UA.  At this point no focal source of infection is identified.  Blood cultures are sent.  Will defer antibiotics at this time.  Clinical Course as of 06/12/21 2136  Nancy Fetter Jun 12, 2021  1948 B Natriuretic Peptide(!): 279.6 [KM]  2052 Lactic Acid, Venous: 1.6 [KM]    Clinical Course User Index [KM] Rada Hay, MD     ____________________________________________   FINAL CLINICAL IMPRESSION(S) / ED DIAGNOSES  Final diagnoses:  Atrial fibrillation with RVR ([place])  Acute respiratory failure with hypoxia [facility])     ED Discharge Orders     None        Note:  This document was prepared using Dragon voice recognition software and may include unintentional dictation errors.    Rada Hay, MD 06/12/21 2137

## 2021-06-12 NOTE — H&P (Addendum)
History and Physical    Matthew [name] FFM:[id number] DOB: 12-08-1950 DOA: 06/12/2021  PCP: [name], MD    Patient coming from:  Home    Chief Complaint:  SOB   HPI: Matthew [name] is a 70 y.o. male seen in ed with complaints of shortness of breath and cough.  Patient states that its been going on off and on for about a year.  He really did not know that there was anything wrong with him so he did not have it evaluated.  Last time he did see his primary care doctor was about a month ago.  States he does not like to take medications and he has taken his Cymbalta off.  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  Patient today denies any chest pains or palpitations or blurred vision or headache or speech or gait or any issues otherwise.  Patient currently lives with with his wife.  Patient has never smoked but states that he has been told that he has a touch of COPD.  Today EMS was called to his home for his worsening shortness of breath and patient initially on assessment was in a tripod position with wheezing, patient's heart rate was found to be in A. fib RVR and was given 5 mg of metoprolol along with DuoNeb treatments albuterol and Solu-Medrol.  In the emergency room patient received 10 mg of diltiazem followed by diltiazem drip. Patient states that shortness of breath has been going on for about a year he notices that it is worse with activity and he gets dyspnea on exertion, he denies any worsening or positional changes or orthopnea.  Patient denies any chest pains or palpitations.   Pt has past medical history of hypertension, asthma, COPD, obesity, CKD stage IIIb, allergies to Demerol.  ED Course:  Vitals:   06/12/21 2000 06/12/21 2030 06/12/21 2123 06/12/21 2130  BP: (!) 144/74 139/73 118/84 124/87   Pulse: 93 (!) 53  75  Resp: (!) 23 (!) 26 19 (!) 22  Temp:      TempSrc:      SpO2: 98% (!) 86%  98%  Weight:      Height:      In the emergency room patient is alert awake oriented afebrile oxygenating above 98% on 2 L nasal cannula.  Initial EKG shows A. fib RVR at 159 with ST depressions lead to 3 and V4 V5 and V6. CMP shows potassium of 5.6, glucose of 120, creatinine of 2.62, normal AST ALT of 25/17, total bili of 1.3.  Blood cultures obtained in the ED due to leukocytosis. BNP of 279.6, troponin of 24 and a repeat troponin of 21, lactic of 1.6, WBC count of 18.4, hemoglobin of 13.7, MCV of 104.5, platelets of 482. Respiratory panel negative for flu and COVID. In the emergency room patient given diltiazem 10 mg and started on diltiazem drip.  Review of Systems:  Review of Systems  Respiratory:  Positive for shortness of breath.   Cardiovascular:  Positive for leg swelling.  All other systems reviewed and are negative.   Past Medical History:  Diagnosis Date   Asthma    Edema    Hip dislocation, bilateral ([place])    Hypertension     Past Surgical History:  Procedure  Laterality Date   ABDOMINAL SURGERY     CHOLECYSTECTOMY     COLOSTOMY     COLOSTOMY TAKEDOWN     HERNIA REPAIR     LEFT HEART CATH AND CORONARY ANGIOGRAPHY N/A 10/17/2018   Procedure: LEFT HEART CATH AND CORONARY ANGIOGRAPHY with possible PCI and stent;  Surgeon: Yolonda Kida, MD;  Location: [facility];  Service: Cardiovascular;  Laterality: N/A;   ORIF FEMUR FRACTURE Left 05/19/2020   Procedure: OPEN REDUCTION INTERNAL FIXATION (ORIF) DISTAL FEMUR FRACTURE;  Surgeon: Shona Needles, MD;  Location: [facility];  Service: Orthopedics;  Laterality: Left;   TOTAL HIP ARTHROPLASTY Bilateral      reports that he has never smoked. He has never used smokeless tobacco. He reports that he does not currently use alcohol. No history on file for drug use.  Allergies  Allergen Reactions   Demerol [Meperidine  Hcl]     Family History  Problem Relation Age of Onset   COPD Mother    Cancer Mother    Melanoma Father     Prior to Admission medications   Medication Sig Start Date End Date Taking? Authorizing Provider  albuterol (VENTOLIN HFA) 108 (90 Base) MCG/ACT inhaler Inhale 2 puffs into the lungs every 6 (six) hours as needed for wheezing or shortness of breath. 11/05/20   Alfred Levins, [place], MD  albuterol (VENTOLIN HFA) 108 (90 Base) MCG/ACT inhaler TAKE 2 PUFFS BY MOUTH EVERY 6 HOURS AS NEEDED FOR WHEEZE OR SHORTNESS OF BREATH 01/12/21   [name], MD  albuterol (VENTOLIN HFA) 108 (90 Base) MCG/ACT inhaler INHALE 1 PUFF BY MOUTH INTO THE LUNGS DAILY 04/28/21   [name], MD  allopurinol (ZYLOPRIM) 100 MG tablet Take 1 tablet (100 mg total) by mouth daily. Patient taking differently: Take 100 mg by mouth as needed (gout). 03/22/20   [name], MD  amLODipine (NORVASC) 10 MG tablet Take 1 tablet (10 mg total) by mouth daily. 05/24/20   Nita Sells, MD  DULoxetine (CYMBALTA) 60 MG capsule TAKE 1 CAPSULE BY MOUTH TWICE DAILY 05/27/20   [name], MD  gabapentin (NEURONTIN) 300 MG capsule Take 1 capsule (300 mg total) by mouth 2 (two) times daily. 05/24/20   Nita Sells, MD  lisinopril (ZESTRIL) 10 MG tablet TAKE 1 TABLET BY MOUTH EVERY DAY 02/17/21   [name], MD  meloxicam (MOBIC) 15 MG tablet TAKE 1 TABLET (15 MG TOTAL) BY MOUTH DAILY. 03/17/21   [name], MD  methocarbamol (ROBAXIN) 500 MG tablet Take 1 tablet (500 mg total) by mouth every 6 (six) hours as needed for muscle spasms. 05/21/20   Haddix, Thomasene Lot, MD  polyethylene glycol (MIRALAX / GLYCOLAX) 17 g packet Take 17 g by mouth daily as needed for mild constipation. 05/24/20   Nita Sells, MD  SEREVENT DISKUS 50 MCG/DOSE diskus inhaler Inhale 1 puff into the lungs 2 (two) times daily. 12/22/19   [name], MD    Physical Exam: Vitals:   06/12/21 2000 06/12/21 2030 06/12/21 2123 06/12/21 2130   BP: (!) 144/74 139/73 118/84 124/87  Pulse: 93 (!) 53  75  Resp: (!) 23 (!) 26 19 (!) 22  Temp:      TempSrc:      SpO2: 98% (!) 86%  98%  Weight:      Height:       Physical Exam Vitals and nursing note reviewed.  Constitutional:      General: He is not in acute distress.  Appearance: He is obese. He is not ill-appearing, toxic-appearing or diaphoretic.  HENT:     Head: Normocephalic and atraumatic.     Right Ear: External ear normal.     Left Ear: External ear normal.     Nose: Nose normal.     Mouth/Throat:     Mouth: Mucous membranes are moist.  Eyes:     Extraocular Movements: Extraocular movements intact.     Pupils: Pupils are equal, round, and reactive to light.  Neck:     Vascular: No carotid bruit.  Cardiovascular:     Rate and Rhythm: Tachycardia present. Rhythm irregular.     Pulses: Normal pulses.          Dorsalis pedis pulses are 2+ on the right side and 2+ on the left side.       Posterior tibial pulses are 2+ on the right side and 2+ on the left side.     Heart sounds: Normal heart sounds.  Pulmonary:     Effort: Pulmonary effort is normal.     Breath sounds: Examination of the right-lower field reveals rales. Examination of the left-lower field reveals rales. Rales present.  Abdominal:     General: Bowel sounds are normal. There is no distension.     Palpations: Abdomen is soft. There is no mass.     Tenderness: There is no abdominal tenderness. There is no guarding.     Hernia: No hernia is present.  Musculoskeletal:     Right lower leg: 2+ Pitting Edema present.     Left lower leg: 2+ Pitting Edema present.  Neurological:     General: No focal deficit present.     Mental Status: He is alert and oriented to person, place, and time.     Cranial Nerves: Cranial nerves 2-12 are intact.     Motor: Motor function is intact.  Psychiatric:        Attention and Perception: Attention normal.        Mood and Affect: Mood normal.        Speech: Speech  normal.        Behavior: Behavior normal. Behavior is cooperative.        Thought Content: Thought content normal.   Labs on Admission: I have personally reviewed following labs and imaging studies No results for input(s): CKTOTAL, CKMB, TROPONINI in the last 72 hours. Lab Results  Component Value Date   WBC 18.4 (H) 06/12/2021   HGB 13.7 06/12/2021   HCT 41.6 06/12/2021   MCV 104.5 (H) 06/12/2021   PLT 482 (H) 06/12/2021    Recent Labs  Lab 06/12/21 1834  NA 137  K 5.6*  CL 107  CO2 24  BUN 44*  CREATININE 2.62*  CALCIUM 9.3  PROT 7.8  BILITOT 1.3*  ALKPHOS 92  ALT 17  AST 25  GLUCOSE 128*    COVID-19 Labs No results for input(s): DDIMER, FERRITIN, LDH, CRP in the last 72 hours. Lab Results  Component Value Date   [place] NEGATIVE 06/12/2021   [place] NEGATIVE 12/04/2020   [place] NEGATIVE 05/23/2020   [place] NEGATIVE 05/20/2020   Radiological Exams on Admission: DG Chest Portable 1 View  Result Date: 06/12/2021 CLINICAL DATA:  Shortness of breath and wheezing EXAM: PORTABLE CHEST 1 VIEW COMPARISON:  Radiograph 12/04/2020 FINDINGS: Stable heart size and mediastinal contours. The lungs are hyperinflated. There is slight increased bronchial thickening from prior exam. No focal airspace disease. Blunting of the costophrenic angles felt to be  due to hyperinflation rather than pleural effusion. No pneumothorax. Remote right rib fracture. Thoracic spine spondylosis. IMPRESSION: Chronic hyperinflation. Slight increased bronchial thickening from prior exam may be acute bronchitis or asthma. Electronically Signed   By: Keith Rake M.D.   On: 06/12/2021 18:48    EKG: Independently reviewed.  Initial EKG shows A. fib RVR at 159 with ST depressions lead to 3 and V4 V5 and V6.  Echocardiogram: Pending  LE Venous dopplers: Pending.   Assessment/Plan Principal Problem:   SOB (shortness of breath) Active Problems:   Edema of both legs   Acute  respiratory failure with hypoxia (HCC)   COPD with acute exacerbation (HCC)   Atrial fibrillation with RVR (HCC)   Acute kidney injury superimposed on chronic kidney disease (HCC)   Elevated glucose level   Obesity   Essential hypertension   Macrocytosis   Shortness of breath/ LE edema /acute respiratory failure with hypoxia/ COPD: -Patient is an obese 70 year old who has not been taking care of himself for several years presenting with shortness of breath and acute onset of presentation along with A. fib RVR with heart rates in the 150s and 160s. -Differentials include A. fib RVR due to hypertension, associated congestive heart failure, pulmonary embolism, right heart strain, sleep apnea associated pulmonary hypertension. -We will admit patient to progressive unit with continuous cardiac monitoring and pulse oximetry but will vitals per unit protocol.Repeat ekg in am.  -Strict I's and O's, cardiac diet, as needed diuretic therapy with monitoring renal function and output. -Supplemental oxygen for goal sats in the high 90s. -Bedrest with assistance with ambulation. -We will continue patient's Solu-Medrol with a quick short taper along with DuoNeb's and albuterol. Incentive spirometer. -Low threshold for V/Q scan for VTE eval.  -Suspect pt may have underlying Heart failure combined or diastolic.  -We will obtain echo and Prn lasix in meantime.  -Pt states he does not have COPD.  -His chest xray does show hyperinflation, ? If this is related to second hand exposure from hs parents.  -Would consider quick solumedrol taper to medrol dose pack tomorrow.  -Prn Mdi and duoneb. - PFT once pt is not in exacerbation.  A.fib rvr: -New onset of a.fib rvr.  -Attribute to hypertension. -We will obtain 2 d echo.  -Pts [place] score is 2 so we will currently start asa 81 mg daily from tonight.  -D/W pt about Long term anticoagulation based on echo and V/Q if dimer or Doppler positive.  -Osa eval with  sleep study to prevent right heart failure and pulmonary htn. T/t of underlying osa if present will significantly help his heart failure and strain.repeat EKG in am.  -Continue diltiazem drip and transition to po diltiazem once hr is controlled or pt has converted.    AKI on CKD: Lab Results  Component Value Date   CREATININE 2.62 (H) 06/12/2021   CREATININE 1.81 (H) 12/04/2020   CREATININE 1.84 (H) 12/03/2020  -Will currently hold his ACEi therapy.  -Hold diuretic therapy.  -Will monitor of improvement and then restart at lower dose. -Avoid contrast studies and renally dose meds.   Elevated glucose / Obesity: -We will obtain L3Y. -Pt has metabolic syndrome but we will check for diabetes as well.  -Also obtain lipid panel, thyroid studies.  -Consistent carb diet.   Benign essential HTN: Blood pressure 124/87, pulse 75, temperature 97.9 F (36.6 C), temperature source Oral, resp. rate (!) 22, height 6\' 2"  (1.88 m), weight 136.1 kg, SpO2 98 %. -We  will start pt on hydralazine overnight and monitor for improvement in renal function .  -Resume home meds and titrate dose increase cautiously to prevent worsening renal function.   Macrocytosis: -We will check b12/ folate and thyroid studies.    Leucocytosis: -cultures collected in ED. -suspect stress response.  -No abx currently.    DVT prophylaxis:  Heparin    Code Status:  Full code    Family Communication:  Cid,Betty Charlame (Spouse)  [phone] (Home Phone)   Disposition Plan:  Home    Consults called:  None   Admission status: Inpatient.     Para Skeans MD [facility] [phone] How to contact the [facility] Attending or Consulting provider [place] or covering provider during after hours [place], for this patient.    Check the care team in [facility] and look for a) attending/consulting [facility] provider listed and b) the [facility] team listed Log into [email or web address] and use [place]'s universal password to access.  If you do not have the password, please contact the hospital operator. Locate the [facility] provider you are looking for under [facility] and page to a number that you can be directly reached. If you still have difficulty reaching the provider, please page the [facility] (Director on Call) for the Hospitalists listed on amion for assistance. [email or web address] Password [email or web address] 06/13/2021, 12:30 AM

## 2021-06-12 NOTE — ED Triage Notes (Signed)
Pt to ED via [facility] from home, EMS reports that they were called out for shortness of breath. EMS reports that when they arrived pt in tripod position with audible wheezing, pt reported that he ran out of his albuterol 3 days ago. Pt in A fib with RVR on the monitor with no hx/o same. Pt was given 5 mg of Metoprolol, 3 duoneb treatments, 2.5 mg of albuterol, 125 mg of solumedrol, 4 mg of zofran and about 100 cc of fluid.  CBB 143 HR 532'D BP 924 systolic SpO2 26% on room air  98% on breathing treatment. Temp 98.5 Co2 38

## 2021-06-12 NOTE — ED Notes (Signed)
Lactate, cultures, urine, admit   Rada Hay, MD 06/12/21 [phone]

## 2021-06-12 NOTE — ED Notes (Signed)
Pt given diet coke and crackers. Resting in bed no verbalized needs at this time

## 2021-06-13 ENCOUNTER — Encounter: Payer: Self-pay | Admitting: Internal Medicine

## 2021-06-13 ENCOUNTER — Inpatient Hospital Stay
Admit: 2021-06-13 | Discharge: 2021-06-13 | Disposition: A | Discharge status: Home / Self Care | Payer: Medicare HMO | Attending: Internal Medicine | Admitting: Internal Medicine

## 2021-06-13 ENCOUNTER — Inpatient Hospital Stay: Payer: Medicare HMO

## 2021-06-13 DIAGNOSIS — I5033 Acute on chronic diastolic (congestive) heart failure: Secondary | ICD-10-CM | POA: Insufficient documentation

## 2021-06-13 DIAGNOSIS — M79604 Pain in right leg: Secondary | ICD-10-CM | POA: Diagnosis not present

## 2021-06-13 DIAGNOSIS — I4891 Unspecified atrial fibrillation: Secondary | ICD-10-CM | POA: Diagnosis not present

## 2021-06-13 DIAGNOSIS — E669 Obesity, unspecified: Secondary | ICD-10-CM

## 2021-06-13 DIAGNOSIS — R0602 Shortness of breath: Secondary | ICD-10-CM | POA: Diagnosis not present

## 2021-06-13 DIAGNOSIS — I5031 Acute diastolic (congestive) heart failure: Secondary | ICD-10-CM

## 2021-06-13 DIAGNOSIS — R531 Weakness: Secondary | ICD-10-CM | POA: Insufficient documentation

## 2021-06-13 LAB — TSH: TSH: 1.783 u[IU]/mL (ref 0.350–4.500)

## 2021-06-13 LAB — URINALYSIS, COMPLETE (UACMP) WITH MICROSCOPIC
Bilirubin Urine: NEGATIVE
Glucose, UA: NEGATIVE mg/dL
Ketones, ur: NEGATIVE mg/dL
Nitrite: NEGATIVE
Protein, ur: NEGATIVE mg/dL
Specific Gravity, Urine: 1.01 (ref 1.005–1.030)
pH: 5 (ref 5.0–8.0)

## 2021-06-13 LAB — LACTIC ACID, PLASMA: Lactic Acid, Venous: 1.9 mmol/L (ref 0.5–1.9)

## 2021-06-13 LAB — LIPID PANEL
Cholesterol: 101 mg/dL (ref 0–200)
HDL: 40 mg/dL — ABNORMAL LOW (ref >40)
LDL Cholesterol: 47 mg/dL (ref 0–99)
Total CHOL/HDL Ratio: 2.5 RATIO
Triglycerides: 69 mg/dL (ref <150)
VLDL: 14 mg/dL (ref 0–40)

## 2021-06-13 LAB — BASIC METABOLIC PANEL
Anion gap: 9 (ref 5–15)
BUN: 53 mg/dL — ABNORMAL HIGH (ref 8–23)
CO2: 22 mmol/L (ref 22–32)
Calcium: 8.8 mg/dL — ABNORMAL LOW (ref 8.9–10.3)
Chloride: 104 mmol/L (ref 98–111)
Creatinine, Ser: 2.8 mg/dL — ABNORMAL HIGH (ref 0.61–1.24)
GFR, Estimated: 23 mL/min — ABNORMAL LOW (ref >60)
Glucose, Bld: 258 mg/dL — ABNORMAL HIGH (ref 70–99)
Potassium: 4.7 mmol/L (ref 3.5–5.1)
Sodium: 135 mmol/L (ref 135–145)

## 2021-06-13 LAB — ECHOCARDIOGRAM COMPLETE
AR max vel: 2.26 cm2
AV Area VTI: 2.88 cm2
AV Area mean vel: 2.24 cm2
AV Mean grad: 2 mmHg
AV Peak grad: 3.7 mmHg
Ao pk vel: 0.96 m/s
Area-P 1/2: 7.59 cm2
Height: 74 in
S' Lateral: 4.3 cm
Weight: 4800 oz

## 2021-06-13 LAB — CBC
HCT: 38.1 % — ABNORMAL LOW (ref 39.0–52.0)
Hemoglobin: 12 g/dL — ABNORMAL LOW (ref 13.0–17.0)
MCH: 33.1 pg (ref 26.0–34.0)
MCHC: 31.5 g/dL (ref 30.0–36.0)
MCV: 105 fL — ABNORMAL HIGH (ref 80.0–100.0)
Platelets: 362 10*3/uL (ref 150–400)
RBC: 3.63 MIL/uL — ABNORMAL LOW (ref 4.22–5.81)
RDW: 13.2 % (ref 11.5–15.5)
WBC: 11.3 10*3/uL — ABNORMAL HIGH (ref 4.0–10.5)
nRBC: 0 % (ref 0.0–0.2)

## 2021-06-13 LAB — HEMOGLOBIN A1C
Hgb A1c MFr Bld: 5.9 % — ABNORMAL HIGH (ref 4.8–5.6)
Mean Plasma Glucose: 122.63 mg/dL

## 2021-06-13 LAB — FOLATE: Folate: 6.1 ng/mL (ref >5.9)

## 2021-06-13 LAB — T4, FREE: Free T4: 1.2 ng/dL — ABNORMAL HIGH (ref 0.61–1.12)

## 2021-06-13 LAB — URIC ACID: Uric Acid, Serum: 10.5 mg/dL — ABNORMAL HIGH (ref 3.7–8.6)

## 2021-06-13 LAB — VITAMIN B12: Vitamin B-12: 149 pg/mL — ABNORMAL LOW (ref 180–914)

## 2021-06-13 MED ORDER — OXYCODONE HCL 5 MG PO TABS
5.0000 mg | ORAL_TABLET | Freq: Four times a day (QID) | ORAL | Status: DC | PRN
  Administered 2021-06-13 – 2021-06-23 (×15): 5 mg via ORAL
  Filled 2021-06-13 (×16): qty 1

## 2021-06-13 MED ORDER — HYDRALAZINE HCL 25 MG PO TABS
25.0000 mg | ORAL_TABLET | Freq: Three times a day (TID) | ORAL | Status: DC
  Administered 2021-06-13 – 2021-06-14 (×4): 25 mg via ORAL
  Filled 2021-06-13 (×4): qty 1

## 2021-06-13 MED ORDER — FUROSEMIDE 10 MG/ML IJ SOLN
40.0000 mg | Freq: Two times a day (BID) | INTRAMUSCULAR | Status: AC
  Administered 2021-06-13: 40 mg via INTRAVENOUS
  Filled 2021-06-13: qty 4

## 2021-06-13 MED ORDER — DILTIAZEM HCL ER COATED BEADS 180 MG PO CP24
180.0000 mg | ORAL_CAPSULE | Freq: Every day | ORAL | Status: DC
  Administered 2021-06-13 – 2021-06-14 (×2): 180 mg via ORAL
  Filled 2021-06-13 (×2): qty 1

## 2021-06-13 MED ORDER — PROSOURCE PLUS PO LIQD
30.0000 mL | Freq: Three times a day (TID) | ORAL | Status: DC
  Administered 2021-06-13 – 2021-06-14 (×2): 30 mL via ORAL
  Filled 2021-06-13 (×5): qty 30

## 2021-06-13 MED ORDER — ADULT MULTIVITAMIN W/MINERALS CH
1.0000 | ORAL_TABLET | Freq: Every day | ORAL | Status: DC
  Administered 2021-06-13 – 2021-06-23 (×11): 1 via ORAL
  Filled 2021-06-13 (×11): qty 1

## 2021-06-13 MED ORDER — METHYLPREDNISOLONE SODIUM SUCC 40 MG IJ SOLR
40.0000 mg | Freq: Two times a day (BID) | INTRAMUSCULAR | Status: DC
  Administered 2021-06-13 – 2021-06-17 (×10): 40 mg via INTRAVENOUS
  Filled 2021-06-13 (×10): qty 1

## 2021-06-13 MED ORDER — CYANOCOBALAMIN 1000 MCG/ML IJ SOLN
1000.0000 ug | Freq: Every day | INTRAMUSCULAR | Status: AC
  Administered 2021-06-13 – 2021-06-15 (×3): 1000 ug via INTRAMUSCULAR
  Filled 2021-06-13 (×3): qty 1

## 2021-06-13 MED ORDER — APIXABAN 5 MG PO TABS
5.0000 mg | ORAL_TABLET | Freq: Two times a day (BID) | ORAL | Status: DC
  Administered 2021-06-13 – 2021-06-23 (×21): 5 mg via ORAL
  Filled 2021-06-13 (×22): qty 1

## 2021-06-13 MED ORDER — MOMETASONE FURO-FORMOTEROL FUM 200-5 MCG/ACT IN AERO
2.0000 | INHALATION_SPRAY | Freq: Two times a day (BID) | RESPIRATORY_TRACT | Status: DC
  Administered 2021-06-13 – 2021-06-23 (×20): 2 via RESPIRATORY_TRACT
  Filled 2021-06-13 (×3): qty 8.8

## 2021-06-13 MED ORDER — FUROSEMIDE 40 MG PO TABS: 40.0000 mg | ORAL_TABLET | Freq: Every day | ORAL | Status: DC

## 2021-06-13 NOTE — Progress Notes (Signed)
Initial Nutrition Assessment  DOCUMENTATION CODES:   Not applicable  INTERVENTION:   -30 ml Prosource Plus TID, each supplement provides 100 kcals and 15 grams protein -MVI with minerals daily  NUTRITION DIAGNOSIS:   Increased nutrient needs related to chronic illness (COPD) as evidenced by estimated needs.  GOAL:   Patient will meet greater than or equal to 90% of their needs  MONITOR:   PO intake, Supplement acceptance, Labs, Weight trends, Skin, I & O's  REASON FOR ASSESSMENT:   Consult Assessment of nutrition requirement/status  ASSESSMENT:   Matthew [name] is a 70 y.o. male seen in ed with complaints of shortness of breath and cough.  Patient states that its been going on off and on for about a year.  He really did not know that there was anything wrong with him so he did not have it evaluated.  Last time he did see his primary care doctor was about a month ago.  States he does not like to take medications and he has taken his Cymbalta off.  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  Patient today denies any chest pains or palpitations or blurred vision or headache or speech or gait or any issues otherwise.  Patient currently lives with with his wife.  Patient has never smoked but states that he has been told that he has a touch of COPD.  Today EMS was called to his home for his worsening shortness of breath and patient initially on assessment was in a tripod position with wheezing, patient's heart rate was found to be in A. fib RVR and was given 5 mg of metoprolol along with DuoNeb treatments albuterol and Solu-Medrol.  In the emergency room patient received 10 mg of diltiazem followed by diltiazem drip.  Pt admitted with SOB, LE edema, and COPD.    Reviewed I/O's: -400 ml x 24 hours   UOP: 400  ml x 24 hours  Pt unavailable at time of visit. RD unable to obtain further nutrition-related history or complete nutrition-focused physical exam at this time.    Per chart review, pt with lower extremity edema, which is likely cause of weight gain and masking possible signs of fat and muscle depletion.   Pt currently on a heart healthy/ carb modified diet with 1.5 L fluid restriction. No meal completion data available to assess at this time.   Medications reviewed and include cardizem, colace, lasix, solu-medrol, and cardizem.   Labs reviewed.   Diet Order:   Diet Order             Diet heart healthy/carb modified Room service appropriate? Yes; Fluid consistency: Thin; Fluid restriction: 1500 mL Fluid  Diet effective now                   EDUCATION NEEDS:   No education needs have been identified at this time  Skin:  Skin Assessment: Reviewed RN Assessment  Last BM:  Unknown  Height:   Ht Readings from Last 1 Encounters:  06/12/21 6\' 2"  (1.88 m)    Weight:   Wt Readings from Last 1 Encounters:  06/12/21 136.1 kg    Ideal Body Weight:  86.4 kg  BMI:  Body mass index is 38.52 kg/m.  Estimated Nutritional Needs:  Kcal:  2400-2600  Protein:  130-145 grams  Fluid:  1.5 L    Loistine Chance, RD, LDN, [place] Registered Dietitian II Certified Diabetes Care and Education Specialist Please refer to [facility] for RD and/or RD on-call/weekend/after hours pager

## 2021-06-13 NOTE — Progress Notes (Signed)
Patient ID: Matthew [name], male   DOB: August 06, 1951, 70 y.o.   MRN: [id number] [facility] PROGRESS NOTE  Matthew [name] [id number]:[id number] DOB: May 17, 1951 DOA: 06/12/2021 PCP: Matthew Athens, MD  HPI/Subjective: Patient states that he has not gotten up and walked in the past 10 days.  Also states he has been having some pain in his right lower extremity now more of a soreness.  Patient was admitted with rapid atrial fibrillation and shortness of breath.  Objective: Vitals:   06/13/21 1248 06/13/21 1300  BP:  (!) 121/91  Pulse: 96   Resp: 20   Temp:    SpO2: 96%     Intake/Output Summary (Last 24 hours) at 06/13/2021 1517 Last data filed at 06/13/2021 1340 Gross per 24 hour  Intake 226.81 ml  Output 400 ml  Net -173.19 ml   Filed Weights   06/12/21 1828  Weight: 136.1 kg    ROS: Review of Systems  Respiratory:  Positive for cough and shortness of breath.   Cardiovascular:  Negative for chest pain.  Gastrointestinal:  Negative for abdominal pain, nausea and vomiting.  Musculoskeletal:  Positive for joint pain and myalgias.  Exam: Physical Exam Vitals reviewed.  HENT:     Head: Normocephalic.     Mouth/Throat:     Pharynx: No oropharyngeal exudate.  Eyes:     General: Lids are normal.     Conjunctiva/sclera: Conjunctivae normal.     Pupils: Pupils are equal, round, and reactive to light.  Cardiovascular:     Rate and Rhythm: Normal rate. Rhythm irregularly irregular.     Heart sounds: Normal heart sounds, S1 normal and S2 normal.  Pulmonary:     Breath sounds: Examination of the right-lower field reveals decreased breath sounds and rhonchi. Examination of the left-lower field reveals decreased breath sounds and rhonchi. Decreased breath sounds and rhonchi present. No wheezing or rales.  Abdominal:     Palpations: Abdomen is soft.     Tenderness: There is no abdominal tenderness.  Musculoskeletal:     Right ankle: Swelling present.     Left ankle: Swelling present.   Skin:    General: Skin is warm.     Findings: No rash.  Neurological:     Mental Status: He is alert and oriented to person, place, and time.      Scheduled Meds:  (feeding supplement) PROSource Plus  30 mL Oral TID BM   apixaban  5 mg Oral BID   aspirin EC  81 mg Oral Daily   cyanocobalamin  1,000 mcg Intramuscular Daily   diltiazem  180 mg Oral Daily   docusate sodium  100 mg Oral BID   furosemide  20 mg Intravenous BID   hydrALAZINE  25 mg Oral Q8H   methylPREDNISolone (SOLU-MEDROL) injection  40 mg Intravenous Q12H   multivitamin with minerals  1 tablet Oral Daily   nicotine  14 mg Transdermal Daily   sodium chloride flush  3 mL Intravenous Q12H   Assessment/Plan:  Atrial fibrillation with rapid ventricular rate.  Patient was initially on Cardizem drip.  I started oral Cardizem and was able to taper off the drip.  Discussed blood thinner with the patient and he is okay starting blood thinners at this time. Shortness of breath.  Unclear if this is acute diastolic CHF versus bronchitis.  Treating for both with IV Lasix and nebulizers and steroids. Acute kidney injury on chronic kidney disease stage IIIb.  Watch closely with diuresis. Right leg pain.  Ultrasound negative for DVT.  We will send off uric acid Vitamin B12 deficiency.  Level 149.  Will give IM B12 injections for 3 days Essential hypertension on Cardizem and hydralazine Weakness.  Physical therapy recommending rehab Hyperkalemia on presentation improved Obesity.  Recommend outpatient sleep study since likely has sleep apnea    Code Status:     Code Status Orders  (From admission, onward)           Start     Ordered   06/12/21 2230  Full code  Continuous        06/12/21 2231           Code Status History     Date Active Date Inactive Code Status Order ID Comments User Context   12/04/2020 0319 12/04/2020 2328 Full Code [id number]  Athena Masse, MD ED   05/18/2020 0506 05/27/2020 2244 Full Code  [id number]  Rise Patience, MD Inpatient   10/15/2018 1704 10/18/2018 2131 Full Code [id number]  Loletha Grayer, MD ED      Family Communication: Spoke with patient's wife on the phone Disposition Plan: Status is: Inpatient  Time spent: 28 minutes  [place]

## 2021-06-13 NOTE — ED Notes (Signed)
Patient ate entire meal tray.  

## 2021-06-13 NOTE — Progress Notes (Signed)
[place] for Apixaban Indication: atrial fibrillation  Allergies  Allergen Reactions   Demerol [Meperidine Hcl]    Lisinopril Other (See Comments)    Hypotensive     Patient Measurements: Height: 6\' 2"  (188 cm) Weight: 136.1 kg (300 lb) IBW/kg (Calculated) : 82.2   Labs: Recent Labs    06/12/21 1834 06/12/21 2124 06/13/21 0319  HGB 13.7  --  12.0*  HCT 41.6  --  38.1*  PLT 482*  --  362  CREATININE 2.62*  --  2.80*  TROPONINIHS 24* 21*  --     Estimated Creatinine Clearance: 35.5 mL/min (A) (by C-G formula based on SCr of 2.8 mg/dL (H)).   Medical History: Past Medical History:  Diagnosis Date   Asthma    Edema    Hip dislocation, bilateral ([place])    Hypertension      Assessment: 70 y.o. male seen in ed with complaints of shortness of breath and cough. PT with new onset afib. Pharmacy has been consulted for Eliquis.  Goal of Therapy:  Monitor platelets by anticoagulation protocol: Yes   Plan:  Start apixaban 5 mg BID CBC per protocol  Othon Guardia O Laria Grimmett 06/13/2021,2:15 PM

## 2021-06-13 NOTE — Progress Notes (Signed)
*  PRELIMINARY RESULTS* Echocardiogram 2D Echocardiogram has been performed.  Sherrie Sport 06/13/2021, 10:58 AM

## 2021-06-13 NOTE — ED Notes (Signed)
Pt given a sandwich tray and beverage by NT LaTonya. Pt in NAD and has no concerns for this RN

## 2021-06-13 NOTE — Evaluation (Signed)
Occupational Therapy Evaluation Patient Details Name: Matthew [name] MRN: [id number] DOB: 16-Jun-1951 Today's Date: 06/13/2021   History of Present Illness Pt admitted for complaints of cough and SOB symptoms. HIstory includes HTN, asthma, COPD, and obesity.   Clinical Impression   Mr Kimmel was seen for OT evaluation this date. Prior to hospital admission, pt was MOD I for mobility and I/ADLs using SPC. Pt lives with spouse in mobile home c 4-5 STE. Pt presents to acute OT demonstrating impaired ADL performance and functional mobility 2/2 decreased activity tolerance and functional strength/balance deficits. Pt currently requires MOD A exit L side of bed. MIN A don B socks seated EOB. SpO2 76% with mobility resolved to 90s at rest on RA. Pt initiated sit<>stand and achieves ~2 inch clearance from bed with no physical assist, anticpate requiring assist to achieve upright posture. Pt would benefit from skilled OT to address noted impairments and functional limitations (see below for any additional details) in order to maximize safety and independence while minimizing falls risk and caregiver burden. Upon hospital discharge, recommend STR to maximize pt safety and return to PLOF.       Recommendations for follow up therapy are one component of a multi-disciplinary discharge planning process, led by the attending physician.  Recommendations may be updated based on patient status, additional functional criteria and insurance authorization.   Follow Up Recommendations  Skilled nursing-short term rehab (<3 hours/day) (pt may progress)   Assistance Recommended at Discharge Intermittent Supervision/Assistance  Functional Status Assessment  Patient has had a recent decline in their functional status and demonstrates the ability to make significant improvements in function in a reasonable and predictable amount of time.  Equipment Recommendations  [facility]    Recommendations for Other Services        Precautions / Restrictions Precautions Precautions: Fall Restrictions Weight Bearing Restrictions: No      Mobility Bed Mobility Overal bed mobility: Needs Assistance Bed Mobility: Supine to Sit;Sit to Supine     Supine to sit: Mod assist Sit to supine: Min assist   General bed mobility comments: Tolerates ~10 min sitting EOB    Transfers Overall transfer level: Needs assistance Equipment used: None Transfers: Sit to/from Stand;Lateral/Scoot Transfers Sit to Stand: From elevated surface          Lateral/Scoot Transfers: Min assist General transfer comment: achieves ~2 inch clearance from bed with no physical assist, anticpate requiring assist to achieve upright posture      Balance Overall balance assessment: History of Falls;Needs assistance Sitting-balance support: Bilateral upper extremity supported;Feet supported Sitting balance-Leahy Scale: Fair     Standing balance support: Bilateral upper extremity supported Standing balance-Leahy Scale: Poor                             ADL either performed or assessed with clinical judgement   ADL Overall ADL's : Needs assistance/impaired                                       General ADL Comments: MIN A don B socks seated EOB. CGA for periaccess at EOB with ~2 inch clearance from bed - unable to achieve upright standing 2/2 SpO2 76% on RA.       Pertinent Vitals/Pain Pain Assessment: No/denies pain     Hand Dominance     Extremity/Trunk Assessment Upper Extremity Assessment Upper  Extremity Assessment: Overall WFL for tasks assessed   Lower Extremity Assessment Lower Extremity Assessment: Generalized weakness       Communication Communication Communication: No difficulties   Cognition Arousal/Alertness: Awake/alert Behavior During Therapy: WFL for tasks assessed/performed Overall Cognitive Status: Within Functional Limits for tasks assessed                                        General Comments  SpO2 high 70s with mobility, resolved to low 90s at rest on RA    Exercises Exercises: Other exercises Other Exercises Other Exercises: Pt edcuated re: OT role, DME recs, d/c recs, falls prevention, ECS Other Exercises: LBD, sup<>sit, sit<>stand, lateral scoot, sitting/standing balance/tolerance   Shoulder Instructions      Home Living Family/patient expects to be discharged to:: Private residence Living Arrangements: Spouse/significant other Available Help at Discharge: Family Type of Home: Mobile home Home Access: Stairs to enter Entrance Stairs-Number of Steps: 4-5 Entrance Stairs-Rails: Right;Left;Can reach both Home Layout: One level     Bathroom Shower/Tub: Occupational psychologist: Standard Bathroom Accessibility: No   Home Equipment: Conservation officer, nature (2 wheels);Cane - single point   Additional Comments: typically uses SPC      Prior Functioning/Environment Prior Level of Function : History of Falls (last six months);Independent/Modified Independent             Mobility Comments: reports multiple falls and decreased activity tolerance, hasnt been able to get groceries          OT Problem List: Decreased strength;Decreased activity tolerance;Impaired balance (sitting and/or standing);Cardiopulmonary status limiting activity      OT Treatment/Interventions: Self-care/ADL training;Energy conservation;Therapeutic exercise;DME and/or AE instruction;Therapeutic activities;Balance training;Patient/family education    OT Goals(Current goals can be found in the care plan section) Acute Rehab OT Goals Patient Stated Goal: to return to PLOF OT Goal Formulation: With patient Time For Goal Achievement: 06/27/21 Potential to Achieve Goals: Good ADL Goals Pt Will Perform Grooming: with modified independence;standing (c LRAD PRN tolerates >10 min grooming) Pt Will Perform Lower Body Dressing: sit to/from  stand;Independently Pt Will Transfer to Toilet: ambulating;regular height toilet;with modified independence (c LRAD PRN)  OT Frequency: Min 1X/week   Barriers to D/C: Inaccessible home environment          Co-evaluation              AM-PAC OT "6 Clicks" Daily Activity     Outcome Measure Help from another person eating meals?: None Help from another person taking care of personal grooming?: A Lot Help from another person toileting, which includes using toliet, bedpan, or urinal?: A Lot Help from another person bathing (including washing, rinsing, drying)?: A Little Help from another person to put on and taking off regular upper body clothing?: A Little Help from another person to put on and taking off regular lower body clothing?: A Little 6 Click Score: 17   End of Session    Activity Tolerance: Patient tolerated treatment well;Patient limited by fatigue Patient left: in bed;with call bell/phone within reach  OT Visit Diagnosis: Other abnormalities of gait and mobility (R26.89);History of falling (Z91.81)                Time: 4709-6283 OT Time Calculation (min): 25 min Charges:  OT General Charges $OT Visit: 1 Visit OT Evaluation $OT Eval Low Complexity: 1 Low OT Treatments $Self Care/Home Management : 8-22  mins  Dessie Coma, M.S. OTR/L  06/13/21, 2:55 PM  ascom [phone]

## 2021-06-13 NOTE — ED Notes (Signed)
Transport contacted

## 2021-06-13 NOTE — Evaluation (Signed)
Physical Therapy Evaluation Patient Details Name: Matthew [name] MRN: [id number] DOB: Apr 03, 1951 Today's Date: 06/13/2021  History of Present Illness  Pt admitted for complaints of cough and SOB symptoms. HIstory includes HTN, asthma, COPD, and obesity.  Clinical Impression  Pt is a pleasant 70 year old male who was admitted for SOB symptoms. Per secure chat with MD, cleared for participation despite being on cardizem drip. HR monitored throughout. Pt on 2L upon arrival with O2 removed for mobility. Pt performs bed mobility with mod assist with O2 sats initially decreasing to 81% improving to 100% within a few minutes with PLB techniques.  Pt declines transfers as he feels poorly at this time. Pt demonstrates deficits with strength/balance/mobility. Reports he doesn't feel like he is at baseline and hasn't been as mobile in past 2 weeks, currently not at baseline level. Would benefit from skilled PT to address above deficits and promote optimal return to PLOF; recommend transition to STR upon discharge from acute hospitalization.      Recommendations for follow up therapy are one component of a multi-disciplinary discharge planning process, led by the attending physician.  Recommendations may be updated based on patient status, additional functional criteria and insurance authorization.  Follow Up Recommendations Skilled nursing-short term rehab (<3 hours/day)    Assistance Recommended at Discharge Intermittent Supervision/Assistance  Functional Status Assessment Patient has had a recent decline in their functional status and demonstrates the ability to make significant improvements in function in a reasonable and predictable amount of time.  Equipment Recommendations   (TBD)    Recommendations for Other Services       Precautions / Restrictions Precautions Precautions: Fall Restrictions Weight Bearing Restrictions: No      Mobility  Bed Mobility Overal bed mobility: Needs  Assistance Bed Mobility: Supine to Sit     Supine to sit: Mod assist     General bed mobility comments: needs assist and becomes dizzy once seated at EOB. Able to sit at EOB for ~10 mins monitoring vitals. On RA with sats initially at 81% progressing to 100% with cues for pursed lip breathing    Transfers                   General transfer comment: pt declines transfers this date due to fatigue/weakness    Ambulation/Gait                Stairs            Wheelchair Mobility    Modified Rankin (Stroke Patients Only)       Balance Overall balance assessment: History of Falls;Needs assistance Sitting-balance support: Bilateral upper extremity supported;Feet unsupported Sitting balance-Leahy Scale: Fair                                       Pertinent Vitals/Pain Pain Assessment: No/denies pain    Home Living Family/patient expects to be discharged to:: Private residence Living Arrangements: Spouse/significant other Available Help at Discharge: Family Type of Home: Mobile home Home Access: Stairs to enter Entrance Stairs-Rails: Right;Left;Can reach both Entrance Stairs-Number of Steps: 4-5   Home Layout: One level Home Equipment: Conservation officer, nature (2 wheels);Cane - single point Additional Comments: typically uses SPC    Prior Function Prior Level of Function : History of Falls (last six months);Independent/Modified Independent             Mobility Comments: reports multiple falls  and for last 2 weeks, hasn't been mobile, spending majority of day in bed. 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       Hand Dominance        Extremity/Trunk Assessment   Upper Extremity Assessment Upper Extremity Assessment: Overall WFL for tasks assessed    Lower Extremity Assessment Lower Extremity Assessment: Generalized weakness (B LE grossly 4/5)       Communication   Communication: No difficulties  Cognition  Arousal/Alertness: Awake/alert Behavior During Therapy: WFL for tasks assessed/performed Overall Cognitive Status: Within Functional Limits for tasks assessed                                          General Comments      Exercises     Assessment/Plan    PT Assessment Patient needs continued PT services  PT Problem List Decreased strength;Decreased balance;Decreased activity tolerance;Decreased mobility;Cardiopulmonary status limiting activity       PT Treatment Interventions Gait training;DME instruction;Therapeutic activities;Therapeutic exercise;Balance training    PT Goals (Current goals can be found in the Care Plan section)  Acute Rehab PT Goals Patient Stated Goal: to get stronger PT Goal Formulation: With patient Time For Goal Achievement: 06/27/21 Potential to Achieve Goals: Good    Frequency Min 2X/week   Barriers to discharge        Co-evaluation               AM-PAC PT "6 Clicks" Mobility  Outcome Measure Help needed turning from your back to your side while in a flat bed without using bedrails?: A Little Help needed moving from lying on your back to sitting on the side of a flat bed without using bedrails?: A Lot Help needed moving to and from a bed to a chair (including a wheelchair)?: A Lot Help needed standing up from a chair using your arms (e.g., wheelchair or bedside chair)?: A Lot Help needed to walk in hospital room?: A Lot Help needed climbing 3-5 steps with a railing? : Total 6 Click Score: 12    End of Session Equipment Utilized During Treatment: Oxygen Activity Tolerance: Patient tolerated treatment well Patient left: in bed (left with echo tech) Nurse Communication: Mobility status PT Visit Diagnosis: Muscle weakness (generalized) (M62.81);History of falling (Z91.81);Difficulty in walking, not elsewhere classified (R26.2)    Time: 5638-7564 PT Time Calculation (min) (ACUTE ONLY): 23 min   Charges:   PT  Evaluation $PT Eval Moderate Complexity: 1 Mod PT Treatments $Therapeutic Activity: 8-22 mins        Greggory Stallion, PT, DPT [phone]   Yuleimy Kretz 06/13/2021, 12:10 PM

## 2021-06-14 ENCOUNTER — Inpatient Hospital Stay: Payer: Medicare HMO

## 2021-06-14 DIAGNOSIS — J4541 Moderate persistent asthma with (acute) exacerbation: Secondary | ICD-10-CM | POA: Diagnosis not present

## 2021-06-14 DIAGNOSIS — I4891 Unspecified atrial fibrillation: Secondary | ICD-10-CM | POA: Diagnosis not present

## 2021-06-14 DIAGNOSIS — M109 Gout, unspecified: Secondary | ICD-10-CM | POA: Diagnosis not present

## 2021-06-14 DIAGNOSIS — N179 Acute kidney failure, unspecified: Secondary | ICD-10-CM | POA: Diagnosis not present

## 2021-06-14 LAB — CBC
HCT: 38 % — ABNORMAL LOW (ref 39.0–52.0)
Hemoglobin: 11.8 g/dL — ABNORMAL LOW (ref 13.0–17.0)
MCH: 32.3 pg (ref 26.0–34.0)
MCHC: 31.1 g/dL (ref 30.0–36.0)
MCV: 104.1 fL — ABNORMAL HIGH (ref 80.0–100.0)
Platelets: 444 10*3/uL — ABNORMAL HIGH (ref 150–400)
RBC: 3.65 MIL/uL — ABNORMAL LOW (ref 4.22–5.81)
RDW: 13.1 % (ref 11.5–15.5)
WBC: 14.1 10*3/uL — ABNORMAL HIGH (ref 4.0–10.5)
nRBC: 0 % (ref 0.0–0.2)

## 2021-06-14 LAB — BASIC METABOLIC PANEL
Anion gap: 9 (ref 5–15)
BUN: 67 mg/dL — ABNORMAL HIGH (ref 8–23)
CO2: 21 mmol/L — ABNORMAL LOW (ref 22–32)
Calcium: 9.2 mg/dL (ref 8.9–10.3)
Chloride: 103 mmol/L (ref 98–111)
Creatinine, Ser: 3.18 mg/dL — ABNORMAL HIGH (ref 0.61–1.24)
GFR, Estimated: 20 mL/min — ABNORMAL LOW (ref >60)
Glucose, Bld: 194 mg/dL — ABNORMAL HIGH (ref 70–99)
Potassium: 5.1 mmol/L (ref 3.5–5.1)
Sodium: 133 mmol/L — ABNORMAL LOW (ref 135–145)

## 2021-06-14 LAB — GLUCOSE, CAPILLARY
Glucose-Capillary: 157 mg/dL — ABNORMAL HIGH (ref 70–99)
Glucose-Capillary: 139 mg/dL — ABNORMAL HIGH (ref 70–99)

## 2021-06-14 MED ORDER — ONDANSETRON HCL 4 MG/2ML IJ SOLN
4.0000 mg | Freq: Four times a day (QID) | INTRAMUSCULAR | Status: DC | PRN
  Administered 2021-06-18 – 2021-06-19 (×2): 4 mg via INTRAVENOUS
  Filled 2021-06-14 (×2): qty 2

## 2021-06-14 MED ORDER — DILTIAZEM HCL ER 60 MG PO CP12
120.0000 mg | ORAL_CAPSULE | Freq: Once | ORAL | Status: AC
  Administered 2021-06-14: 120 mg via ORAL
  Filled 2021-06-14: qty 2

## 2021-06-14 MED ORDER — DILTIAZEM HCL ER COATED BEADS 180 MG PO CP24
300.0000 mg | ORAL_CAPSULE | Freq: Every day | ORAL | Status: DC
  Administered 2021-06-15 – 2021-06-23 (×9): 300 mg via ORAL
  Filled 2021-06-14 (×9): qty 1

## 2021-06-14 MED ORDER — SODIUM CHLORIDE 0.9 % IV BOLUS
250.0000 mL | Freq: Once | INTRAVENOUS | Status: AC
  Administered 2021-06-14: 250 mL via INTRAVENOUS

## 2021-06-14 MED ORDER — COLCHICINE 0.6 MG PO TABS
0.6000 mg | ORAL_TABLET | Freq: Every day | ORAL | Status: DC
  Administered 2021-06-14: 0.6 mg via ORAL
  Filled 2021-06-14: qty 1

## 2021-06-14 MED ORDER — COLCHICINE 0.6 MG PO TABS
0.3000 mg | ORAL_TABLET | Freq: Every day | ORAL | Status: DC
  Administered 2021-06-15 – 2021-06-23 (×9): 0.3 mg via ORAL
  Filled 2021-06-14: qty 1
  Filled 2021-06-14 (×3): qty 0.5
  Filled 2021-06-14 (×2): qty 1
  Filled 2021-06-14: qty 0.5
  Filled 2021-06-14 (×3): qty 1
  Filled 2021-06-14: qty 0.5
  Filled 2021-06-14: qty 1
  Filled 2021-06-14: qty 0.5
  Filled 2021-06-14 (×4): qty 1
  Filled 2021-06-14 (×3): qty 0.5
  Filled 2021-06-14 (×2): qty 1
  Filled 2021-06-14: qty 0.5

## 2021-06-14 MED ORDER — INSULIN ASPART 100 UNIT/ML IJ SOLN
0.0000 [IU] | Freq: Three times a day (TID) | INTRAMUSCULAR | Status: DC
  Administered 2021-06-15: 2 [IU] via SUBCUTANEOUS
  Administered 2021-06-15 – 2021-06-16 (×2): 1 [IU] via SUBCUTANEOUS
  Administered 2021-06-16 – 2021-06-17 (×2): 2 [IU] via SUBCUTANEOUS
  Administered 2021-06-17: 1 [IU] via SUBCUTANEOUS
  Administered 2021-06-18 – 2021-06-21 (×6): 2 [IU] via SUBCUTANEOUS
  Administered 2021-06-21: 1 [IU] via SUBCUTANEOUS
  Filled 2021-06-14 (×13): qty 1

## 2021-06-14 MED ORDER — INSULIN ASPART 100 UNIT/ML IJ SOLN
0.0000 [IU] | Freq: Every day | INTRAMUSCULAR | Status: DC
  Administered 2021-06-20: 2 [IU] via SUBCUTANEOUS
  Filled 2021-06-14: qty 1

## 2021-06-14 MED ORDER — HYDRALAZINE HCL 10 MG PO TABS
10.0000 mg | ORAL_TABLET | Freq: Three times a day (TID) | ORAL | Status: DC
  Administered 2021-06-14 – 2021-06-20 (×12): 10 mg via ORAL
  Filled 2021-06-14 (×28): qty 1

## 2021-06-14 NOTE — Progress Notes (Signed)
Patient ID: Matthew [name], male   DOB: [birth year], 70 y.o.   MRN: [id number] [facility] PROGRESS NOTE  Matthew [name] OFB:[id number] DOB: 08/05/1951 DOA: 06/12/2021 PCP: Matthew Athens, MD  HPI/Subjective: Patient still has some pain in his right foot.  States he is breathing better than when he came in.  Initially came in with shortness of breath and found to be in atrial fibrillation with rapid ventricular response and acute kidney injury.  Objective: Vitals:   06/14/21 1547 06/14/21 1609  BP:  119/84  Pulse:  80  Resp:  17  Temp:  98.7 F (37.1 C)  SpO2: 94% 98%    Intake/Output Summary (Last 24 hours) at 06/14/2021 1636 Last data filed at 06/14/2021 1509 Gross per 24 hour  Intake 800 ml  Output 1100 ml  Net -300 ml   Filed Weights   06/12/21 1828 06/14/21 0500  Weight: 136.1 kg 136 kg    ROS: Review of Systems  Respiratory:  Positive for cough and shortness of breath.   Cardiovascular:  Negative for chest pain.  Gastrointestinal:  Positive for nausea and vomiting. Negative for abdominal pain.  Musculoskeletal:  Positive for joint pain.  Exam: Physical Exam HENT:     Head: Normocephalic.     Mouth/Throat:     Pharynx: No oropharyngeal exudate.  Eyes:     General: Lids are normal.     Conjunctiva/sclera: Conjunctivae normal.  Cardiovascular:     Rate and Rhythm: Normal rate and regular rhythm.     Heart sounds: Normal heart sounds, S1 normal and S2 normal.  Pulmonary:     Breath sounds: Examination of the right-middle field reveals wheezing. Examination of the left-middle field reveals wheezing. Examination of the right-lower field reveals decreased breath sounds and wheezing. Examination of the left-lower field reveals decreased breath sounds and wheezing. Decreased breath sounds and wheezing present. No rhonchi or rales.  Abdominal:     Palpations: Abdomen is soft.     Tenderness: There is no abdominal tenderness.  Musculoskeletal:     Right ankle: Swelling  present.     Left ankle: Swelling present.  Skin:    General: Skin is warm.     Findings: No rash.     Comments: Scabs seen down on right foot  Neurological:     Mental Status: He is alert and oriented to person, place, and time.      Scheduled Meds:  apixaban  5 mg Oral BID   aspirin EC  81 mg Oral Daily   colchicine  0.6 mg Oral Daily   cyanocobalamin  1,000 mcg Intramuscular Daily   diltiazem  180 mg Oral Daily   docusate sodium  100 mg Oral BID   hydrALAZINE  25 mg Oral Q8H   methylPREDNISolone (SOLU-MEDROL) injection  40 mg Intravenous Q12H   mometasone-formoterol  2 puff Inhalation BID   multivitamin with minerals  1 tablet Oral Daily   sodium chloride flush  3 mL Intravenous Q12H   Brief history 70 year old male with past medical history of edema, hypertension and asthma who presents with shortness of breath.  Patient initially started on steroids and Lasix.  Patient's main complaint was also right foot pain.  Patient was found to be in atrial fibrillation with rapid ventricular response.  Treating for asthma exacerbation.  Assessment/Plan:  Asthma exacerbation.  Continue Solu-Medrol, DuoNeb and Dulera inhaler.  I think this is more likely asthma exacerbation and less likely congestive heart failure at this point. Atrial fibrillation  with rapid ventricular rate.  Heart rate was in the 120s when I saw him.  We will give a short acting Cardizem now and increase Cardizem CD to 300 mg in the morning.  Eliquis for anticoagulation. Acute kidney injury on chronic kidney disease stage IIIb.  With diuresis the patient's creatinine did worsen to 3.18.  We will hold Lasix and give a fluid bolus. Right foot pain likely gout with high uric acid.  Patient on Solu-Medrol already.  We will give a dose of colchicine and continue low-dose on a daily basis. Vitamin B12 deficiency.  Level 149.  IM B12 injections for 3 days then can supplement orally after that Essential hypertension on Cardizem  and hydralazine Hyperkalemia on presentation.  Continue to monitor Obesity with a BMI of 38.50.  Recommend outpatient sleep study. Elevated glucose secondary to steroids.  Hemoglobin A1c 5.9.  Add sliding scale insulin for now.      Code Status:     Code Status Orders  (From admission, onward)           Start     Ordered   06/12/21 2230  Full code  Continuous        06/12/21 2231           Code Status History     Date Active Date Inactive Code Status Order ID Comments User Context   12/04/2020 0319 12/04/2020 2328 Full Code [id number]  Athena Masse, MD ED   05/18/2020 0506 05/27/2020 2244 Full Code [id number]  Rise Patience, MD Inpatient   10/15/2018 1704 10/18/2018 2131 Full Code [id number]  Loletha Grayer, MD ED      Family Communication: Updated patient's wife on the phone Disposition Plan: Status is: Inpatient  Time spent: 27 minutes  [place]

## 2021-06-14 NOTE — Progress Notes (Signed)
   06/13/21 2215  Assess: MEWS Score  Temp 97.7 F (36.5 C)  BP (!) 143/94  ECG Heart Rate (!) 120  Resp 18  Level of Consciousness Alert  SpO2 96 %  O2 Device Room Air  Assess: MEWS Score  MEWS Temp 0  MEWS Systolic 0  MEWS Pulse 2  MEWS RR 0  MEWS LOC 0  MEWS Score 2  MEWS Score Color Yellow  Assess: if the MEWS score is Yellow or Red  Were vital signs taken at a resting state? Yes  Focused Assessment Change from prior assessment (see assessment flowsheet)  Does the patient meet 2 or more of the SIRS criteria? No  MEWS guidelines implemented *See Row Information* No, previously red, continue vital signs every 4 hours  Treat  MEWS Interventions Administered scheduled meds/treatments;Administered prn meds/treatments  Pain Scale 0-10  Pain Score 5  Pain Type Chronic pain  Pain Location Generalized  Pain Descriptors / Indicators Sore  Pain Frequency Constant  Pain Intervention(s) Medication (See eMAR)  Complains of Anxiety  Neuro symptoms relieved by Rest  Take Vital Signs  Increase Vital Sign Frequency  Yellow: Q 2hr X 2 then Q 4hr X 2, if remains yellow, continue Q 4hrs  Escalate  MEWS: Escalate Yellow: discuss with charge nurse/RN and consider discussing with provider and RRT  Notify: Charge Nurse/RN  Name of Charge Nurse/RN Notified Felicia RN  Date Charge Nurse/RN Notified 06/13/21  Time Charge Nurse/RN Notified 2230  Assess: SIRS CRITERIA  SIRS Temperature  0  SIRS Pulse 1  SIRS Respirations  0  SIRS WBC 0  SIRS Score Sum  1

## 2021-06-14 NOTE — TOC Initial Note (Signed)
Transition of Care [facility]) - Initial/Assessment Note    Patient Details  Name: Matthew [name] MRN: [id number] Date of Birth: 04-Jan-1951  Transition [facility] Care [facility]) CM/SW Contact:    Eileen Stanford, LCSW Phone Number: 06/14/2021, 9:42 AM  Clinical Narrative:   Pt lives with spouse. CSW spoke with pt regarding PT recommendation of SNF. Pt described in great length the poor experience he had at SNF and became emotional. 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   Pt states he worries about his wife having to care for him as she is not in the best health herself. Pt states he also had a terrible experience with HH. Pt would like to try SNF again. Pt would like to go to a different SNF. CSW will send referral and follow up with bed offers once available.           Expected Discharge Plan: Skilled Nursing Facility Barriers to Discharge: Continued Medical Work up   Patient Goals and CMS Choice Patient states their goals for this hospitalization and ongoing recovery are:: to go to SNF and get better   Choice offered to / list presented to : Patient  Expected Discharge Plan and Services Expected Discharge Plan: [place] In-house Referral: Clinical Social Work   Post Acute Care Choice: [place] Living arrangements for the past 2 months: [place]                                      Prior Living Arrangements/Services Living arrangements for the past 2 months: Single Family Home Lives with:: Spouse Patient language and need for interpreter reviewed:: Yes Do you feel safe going back to the place where you live?: Yes      Need for Family Participation in Patient Care: Yes (Comment) Care giver support system in place?: Yes (comment)   Criminal Activity/Legal Involvement Pertinent to  Current Situation/Hospitalization: No - Comment as needed  Activities of Daily Living Home Assistive Devices/Equipment: None ADL Screening (condition at time of admission) Patient's cognitive ability adequate to safely complete daily activities?: Yes Is the patient deaf or have difficulty hearing?: No Does the patient have difficulty seeing, even when wearing glasses/contacts?: No Does the patient have difficulty concentrating, remembering, or making decisions?: No Patient able to express need for assistance with ADLs?: Yes Does the patient have difficulty dressing or bathing?: No Independently performs ADLs?: No Communication: Independent Dressing (OT): Independent Grooming: Independent Feeding: Independent Bathing: Needs assistance Is this a change from baseline?: Pre-admission baseline Toileting: Independent In/Out Bed: Needs assistance Is this a change from baseline?: Pre-admission baseline Walks in Home: Independent Does the patient have difficulty walking or climbing stairs?: Yes Weakness of Legs: Both Weakness of Arms/Hands: None  Permission Sought/Granted Permission sought to share information with : Family Supports Permission granted to share information with : Yes, Verbal Permission Granted  Share Information with NAME: [name]     Permission granted to share info w Relationship: spouse     Emotional Assessment Appearance:: Appears stated age Attitude/Demeanor/Rapport: Engaged Affect (typically observed): Accepting Orientation: : Oriented to Self, Oriented to Place, Oriented to  Time, Oriented to Situation Alcohol / Substance Use: Not Applicable Psych  Involvement: No (comment)  Admission diagnosis:  SOB (shortness of breath) [R06.02] Bilateral leg edema [R60.0] Acute respiratory failure with hypoxia (HCC) [J96.01] Atrial fibrillation with RVR (HCC) [I48.91] Patient Active Problem List   Diagnosis Date Noted   Acute diastolic congestive heart failure (HCC)     Right leg pain    Weakness    SOB (shortness of breath) 06/12/2021   Edema of both legs 06/12/2021   Atrial fibrillation with RVR ([place]) 06/12/2021   Acute kidney injury superimposed on chronic kidney disease ([place]) 06/12/2021   Elevated glucose level 06/12/2021   Obesity (BMI 30-39.9) 06/12/2021   Macrocytosis 06/12/2021   Acute respiratory failure with hypoxia ([place]) 06/12/2021   Chronic pain of left knee 12/16/2020   COPD with acute exacerbation ([place]) 12/04/2020   Acute right hip pain 12/04/2020   H/O bilateral hip replacements 12/04/2020   Stage 3b chronic kidney disease ([place]) 12/04/2020   Displaced comminuted fracture of shaft of left femur, initial encounter for closed fracture ([place]) 05/19/2020   Asthma 05/18/2020   Essential hypertension 05/18/2020   Femur fracture, left ([place]) 05/18/2020   Closed comminuted intra-articular fracture of distal femur ([place]) 05/17/2020   Acute asthma exacerbation 10/15/2018   PCP:  [name], MD Pharmacy:   [facility] [place], [place] - [place] AT [facility] [address] [place] [address] Phone: [phone] Fax: [phone]  [id number] - [place], [place] - [address] [place] [address] Phone: [phone] Fax: [phone]     Social Determinants of Health (SDOH) Interventions    Readmission Risk Interventions No flowsheet data found.

## 2021-06-14 NOTE — NC FL2 (Signed)
[place] LEVEL OF CARE SCREENING TOOL     IDENTIFICATION  Patient Name: Matthew [name] Birthdate: 07-26-1951 Sex: male Admission Date (Current Location): 06/12/2021  [place] and [place] Number:  Engineering geologist and Address:  [facility], [address], [place], [place] [address]      Provider Number: [id number]  Attending Physician Name and Address:  Loletha Grayer, MD  Relative Name and Phone Number:       Current Level of Care: Hospital Recommended Level of Care: [place] Prior Approval Number:    Date Approved/Denied:   PASRR Number: [id number]  Discharge Plan: SNF    Current Diagnoses: Patient Active Problem List   Diagnosis Date Noted   Acute diastolic congestive heart failure (HCC)    Right leg pain    Weakness    SOB (shortness of breath) 06/12/2021   Edema of both legs 06/12/2021   Atrial fibrillation with RVR ([place]) 06/12/2021   Acute kidney injury superimposed on chronic kidney disease ([place]) 06/12/2021   Elevated glucose level 06/12/2021   Obesity (BMI 30-39.9) 06/12/2021   Macrocytosis 06/12/2021   Acute respiratory failure with hypoxia ([place]) 06/12/2021   Chronic pain of left knee 12/16/2020   COPD with acute exacerbation ([place]) 12/04/2020   Acute right hip pain 12/04/2020   H/O bilateral hip replacements 12/04/2020   Stage 3b chronic kidney disease ([place]) 12/04/2020   Displaced comminuted fracture of shaft of left femur, initial encounter for closed fracture ([place]) 05/19/2020   Asthma 05/18/2020   Essential hypertension 05/18/2020   Femur fracture, left ([place]) 05/18/2020   Closed comminuted intra-articular fracture of distal femur ([place]) 05/17/2020   Acute asthma exacerbation 10/15/2018    Orientation RESPIRATION BLADDER Height & Weight     Self, Time, Situation, Place  Normal Continent Weight: 299 lb 13.2 oz (136 kg) Height:  6\' 2"  (188 cm)  BEHAVIORAL SYMPTOMS/MOOD NEUROLOGICAL BOWEL  NUTRITION STATUS      Incontinent Diet (heart healthy/carb modified,Fluid restriction: 1500 mL Fluid)  AMBULATORY STATUS COMMUNICATION OF NEEDS Skin   Extensive Assist Verbally Normal                       Personal Care Assistance Level of Assistance  Bathing, Feeding, Dressing Bathing Assistance: Maximum assistance Feeding assistance: Independent Dressing Assistance: Maximum assistance     Functional Limitations Info  Sight, Hearing, Speech Sight Info: Adequate Hearing Info: Adequate Speech Info: Adequate    SPECIAL CARE FACTORS FREQUENCY  PT (By licensed PT), OT (By licensed OT)     PT Frequency: 5x OT Frequency: 5x            Contractures Contractures Info: Not present    Additional Factors Info  Code Status, Allergies Code Status Info: full code Allergies Info: Demerol (Meperidine Hcl), Lisinopril           Current Medications (06/14/2021):  This is the current hospital active medication list Current Facility-Administered Medications  Medication Dose Route Frequency Provider Last Rate Last Admin   acetaminophen (TYLENOL) tablet 650 mg  650 mg Oral Q6H PRN Para Skeans, MD       Or   acetaminophen (TYLENOL) suppository 650 mg  650 mg Rectal Q6H PRN Para Skeans, MD       albuterol (PROVENTIL) (2.5 MG/3ML) 0.083% nebulizer solution 2.5 mg  2.5 mg Inhalation Q6H PRN Para Skeans, MD   2.5 mg at 06/13/21 1449   apixaban (ELIQUIS) tablet  5 mg  5 mg Oral BID Rauer, Forde Dandy, RPH   5 mg at 06/14/21 2505   aspirin EC tablet 81 mg  81 mg Oral Daily Para Skeans, MD   81 mg at 06/14/21 0836   bisacodyl (DULCOLAX) EC tablet 5 mg  5 mg Oral Daily PRN Para Skeans, MD       cyanocobalamin ((VITAMIN B-12)) injection 1,000 mcg  1,000 mcg Intramuscular Daily Loletha Grayer, MD   1,000 mcg at 06/14/21 0837   diltiazem (CARDIZEM CD) 24 hr capsule 180 mg  180 mg Oral Daily Loletha Grayer, MD   180 mg at 06/14/21 0836   docusate sodium (COLACE) capsule 100 mg  100  mg Oral BID Para Skeans, MD   100 mg at 06/14/21 0836   hydrALAZINE (APRESOLINE) injection 5 mg  5 mg Intravenous Q6H PRN Para Skeans, MD       hydrALAZINE (APRESOLINE) tablet 25 mg  25 mg Oral Q8H Florina Ou V, MD   25 mg at 06/14/21 0659   methylPREDNISolone sodium succinate (SOLU-MEDROL) 40 mg/mL injection 40 mg  40 mg Intravenous Q12H Para Skeans, MD   40 mg at 06/13/21 2333   mometasone-formoterol (DULERA) 200-5 MCG/ACT inhaler 2 puff  2 puff Inhalation BID Loletha Grayer, MD   2 puff at 06/14/21 0835   multivitamin with minerals tablet 1 tablet  1 tablet Oral Daily Loletha Grayer, MD   1 tablet at 06/14/21 3976   oxyCODONE (Oxy IR/ROXICODONE) immediate release tablet 5 mg  5 mg Oral Q6H PRN Loletha Grayer, MD   5 mg at 06/13/21 2238   polyethylene glycol (MIRALAX / GLYCOLAX) packet 17 g  17 g Oral Daily PRN Para Skeans, MD       sodium chloride flush (NS) 0.9 % injection 3 mL  3 mL Intravenous Q12H Para Skeans, MD   3 mL at 06/14/21 7341     Discharge Medications: Please see discharge summary for a list of discharge medications.  Relevant Imaging Results:  Relevant Lab Results:   Additional Information [id number]  Eileen Stanford, LCSW

## 2021-06-14 NOTE — Consult Note (Addendum)
   Heart Failure Nurse Navigator Note  HFpEF 50 to 55%.  Diastolic parameters were indeterminate normal right ventricular systolic function.  Mild left atrial enlargement.  He presented to the emergency room by way of EMS from home with complaints of shortness of breath and a cough.  Comorbidities:  Hypertension Asthma  Edema  Labs:  Sodium 133, potassium 5.1, chloride 103, CO2 21, BUN 67 up from 53 yesterday and creatinine 3.18 up from 2.8 of yesterday Intake 226 mL Output 600 mL Weight 136 kg  Medications:  Eliquis 5 mg 2 times a day Aspirin 81 mg daily Cardizem CD 180 mg daily   Lasix 40 mg IV 2 times a day is currently on hold.   Initial meeting with patient today.  Is lying in bed in no acute distress.  States to some extent he has always had a little bit of swelling in his feet and lower legs.  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  He states that he mostly rides a riding lawnmower.  Discussed the importance of low-sodium diet, he states that he does not use salt at the table.  Given examples of low-sodium foods.  Also discussed the importance of maintaining of fluid restriction of less than 64 ounces in a 24-hour period.  Went over what is considered a liquid.  He states that between him and his wife they can drink 2 L bottle of Coke daily.  States that he has a scale, discussed the routine of every morning going to the bathroom and then weighing himself and recording.  Also discussed the outpatient heart failure clinic for which he has an appointment on November 14 at 1 PM.  He had to be redirected several times during the visit.  Was given a living with heart failure teaching book along with low-sodium information.  He had no further questions and we will continue to follow along.  Pricilla Riffle RN CHFN

## 2021-06-14 NOTE — Progress Notes (Signed)
Nutrition Follow-up  DOCUMENTATION CODES:   Not applicable  INTERVENTION:   -D/c Prosource Plus -Double protein portions with meals -Continue MVI with minerals daily  NUTRITION DIAGNOSIS:   Increased nutrient needs related to chronic illness (COPD) as evidenced by estimated needs.  Ongoing  GOAL:   Patient will meet greater than or equal to 90% of their needs  Progressing   MONITOR:   PO intake, Supplement acceptance, Labs, Weight trends, Skin, I & O's  REASON FOR ASSESSMENT:   Consult Assessment of nutrition requirement/status  ASSESSMENT:   Matthew [name] is a 70 y.o. male seen in ed with complaints of shortness of breath and cough.  Patient states that its been going on off and on for about a year.  He really did not know that there was anything wrong with him so he did not have it evaluated.  Last time he did see his primary care doctor was about a month ago.  States he does not like to take medications and he has taken his Cymbalta off.  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  Patient today denies any chest pains or palpitations or blurred vision or headache or speech or gait or any issues otherwise.  Patient currently lives with with his wife.  Patient has never smoked but states that he has been told that he has a touch of COPD.  Today EMS was called to his home for his worsening shortness of breath and patient initially on assessment was in a tripod position with wheezing, patient's heart rate was found to be in A. fib RVR and was given 5 mg of metoprolol along with DuoNeb treatments albuterol and Solu-Medrol.  In the emergency room patient received 10 mg of diltiazem followed by diltiazem drip.  Reviewed I/O's: -373 ml x 24 hours and -773 ml since admission  UOP: 600 ml x 24  hours  Spoke with pt at bedside, who reports feeling much better today. He was very tangential at time of visit and answered multiple phone calls during RD visit. He reports he has a very good appetite, but often feels hungry after meals. He consumed 100% of his breakfast this morning.   PTA pt reports good appetite. He usually consumes 3 meals per day. Meals consist of chicken and vegetables and cheeseburgers. Per pt, "my wife makes better food for my dog and I eat the leftovers, as my dog will not eat regular food".   Pt still with moderate lower extremity edema, which may be masking further weight loss as well as fat and muscle depletions.    Medications reviewed and include vitamin B-12, cardizem, colace, and solu-medrol.   Labs reviewed: Na: 133.    NUTRITION - FOCUSED PHYSICAL EXAM:  Flowsheet Row Most Recent Value  Orbital Region No depletion  Upper Arm Region Mild depletion  Thoracic and Lumbar Region No depletion  Buccal Region No depletion  Temple Region No depletion  Clavicle Bone Region No depletion  Clavicle and Acromion Bone Region No depletion  Scapular Bone Region No depletion  Dorsal Hand No depletion  Patellar Region No depletion  Anterior Thigh Region No depletion  Posterior Calf Region No depletion  Edema (RD Assessment) Moderate  Hair Reviewed  Eyes Reviewed  Mouth Reviewed  Skin Reviewed  Nails Reviewed       Diet Order:   Diet Order             Diet heart healthy/carb modified Room service appropriate? Yes; Fluid consistency: Thin; Fluid restriction: 1500 mL Fluid  Diet effective now                   EDUCATION NEEDS:   No education needs have been identified at this time  Skin:  Skin Assessment: Reviewed RN Assessment  Last BM:  Unknown  Height:   Ht Readings from Last 1 Encounters:  06/12/21 6\' 2"  (1.88 m)    Weight:   Wt Readings from Last 1 Encounters:  06/14/21 136 kg    Ideal Body Weight:  86.4 kg  BMI:  Body mass  index is 38.5 kg/m.  Estimated Nutritional Needs:   Kcal:  2400-2600  Protein:  130-145 grams  Fluid:  1.5 L    Loistine Chance, RD, LDN, [employer] Registered Dietitian II Certified Diabetes Care and Education Specialist Please refer to [facility] for RD and/or RD on-call/weekend/after hours pager

## 2021-06-15 ENCOUNTER — Other Ambulatory Visit: Payer: Self-pay | Admitting: *Deleted

## 2021-06-15 ENCOUNTER — Inpatient Hospital Stay: Payer: Medicare HMO

## 2021-06-15 DIAGNOSIS — R0602 Shortness of breath: Secondary | ICD-10-CM | POA: Diagnosis not present

## 2021-06-15 LAB — POTASSIUM: Potassium: 5.7 mmol/L — ABNORMAL HIGH (ref 3.5–5.1)

## 2021-06-15 LAB — MAGNESIUM: Magnesium: 2.4 mg/dL (ref 1.7–2.4)

## 2021-06-15 LAB — BASIC METABOLIC PANEL
Anion gap: 9 (ref 5–15)
BUN: 90 mg/dL — ABNORMAL HIGH (ref 8–23)
CO2: 22 mmol/L (ref 22–32)
Calcium: 8.8 mg/dL — ABNORMAL LOW (ref 8.9–10.3)
Chloride: 102 mmol/L (ref 98–111)
Creatinine, Ser: 3.71 mg/dL — ABNORMAL HIGH (ref 0.61–1.24)
GFR, Estimated: 17 mL/min — ABNORMAL LOW (ref >60)
Glucose, Bld: 131 mg/dL — ABNORMAL HIGH (ref 70–99)
Potassium: 5.2 mmol/L — ABNORMAL HIGH (ref 3.5–5.1)
Sodium: 133 mmol/L — ABNORMAL LOW (ref 135–145)

## 2021-06-15 LAB — GLUCOSE, CAPILLARY
Glucose-Capillary: 134 mg/dL — ABNORMAL HIGH (ref 70–99)
Glucose-Capillary: 191 mg/dL — ABNORMAL HIGH (ref 70–99)
Glucose-Capillary: 113 mg/dL — ABNORMAL HIGH (ref 70–99)
Glucose-Capillary: 141 mg/dL — ABNORMAL HIGH (ref 70–99)

## 2021-06-15 LAB — PHOSPHORUS: Phosphorus: 4.8 mg/dL — ABNORMAL HIGH (ref 2.5–4.6)

## 2021-06-15 MED ORDER — TAMSULOSIN HCL 0.4 MG PO CAPS
0.4000 mg | ORAL_CAPSULE | Freq: Every day | ORAL | Status: DC
  Administered 2021-06-15 – 2021-06-22 (×8): 0.4 mg via ORAL
  Filled 2021-06-15 (×8): qty 1

## 2021-06-15 MED ORDER — SODIUM ZIRCONIUM CYCLOSILICATE 10 G PO PACK
10.0000 g | PACK | Freq: Once | ORAL | Status: AC
  Administered 2021-06-15: 10 g via ORAL
  Filled 2021-06-15: qty 1

## 2021-06-15 MED ORDER — DULOXETINE HCL 60 MG PO CPEP: 60.0000 mg | ORAL_CAPSULE | Freq: Two times a day (BID) | ORAL | 6 refills | Status: DC

## 2021-06-15 NOTE — Progress Notes (Signed)
PT Cancellation Note  Patient Details Name: Matthew [name] MRN: [id number] DOB: [birth year]   Cancelled Treatment:    Reason Eval/Treat Not Completed: Medical issues which prohibited therapy. Chart review. Pt with critically high K+ value trending up from 5.2 to currently 5.7 contraindicated for exertional activity activity. PT will continue to follow and initiate PT once medically appropriate.    Salem Caster. Fairly IV, PT, DPT Physical Therapist- [facility]  06/15/2021, 10:32 AM

## 2021-06-15 NOTE — TOC Progression Note (Signed)
Transition of Care [facility]) - Progression Note    Patient Details  Name: Matthew [name] MRN: [id number] Date of Birth: [birth year]  [facility] CM/SW Contact  Beverly Sessions, RN Phone Number: 06/15/2021, 12:48 PM  Clinical Narrative:     No bed offers Bed search extended  Update:  presented bed offers to patient.  He will review with his wife tonight and have a decision tomorrow.  He request that I also call his wife to give her the offers.  VM left   Expected Discharge Plan: [place] Barriers to Discharge: Continued Medical Work up  Expected Discharge Plan and Services Expected Discharge Plan: [place] In-house Referral: Clinical Social Work   Post Acute Care Choice: [place] Living arrangements for the past 2 months: Single Family Home                                       Social Determinants of Health (SDOH) Interventions    Readmission Risk Interventions No flowsheet data found.

## 2021-06-15 NOTE — Progress Notes (Signed)
OT Cancellation Note  Patient Details Name: Matthew [name] MRN: [id number] DOB: [birth year]   Cancelled Treatment:    Reason Eval/Treat Not Completed: Medical issues which prohibited therapy. Chart reviewed - pt noted to have K+ critically high at 5.7; contraindicated for exertional activity at this time. Will continue to follow and initiate services as pt medically appropriate to participate in therapy.   Dessie Coma, M.S. OTR/L  06/15/21, 9:51 AM  ascom [phone]

## 2021-06-15 NOTE — Consult Note (Signed)
[facility] Consult Note:06/15/2021    Date of Admission:  06/12/2021           Reason for Consult:     Referring Provider: Shawna Clamp, MD Primary Care Provider: [name], MD   History of Presenting Illness:  Matthew [name] is a 70 y.o. male  Patient presented to the emergency room with EMS for shortness of breath.  He was initially found to have atrial fibrillation with RVR.  Medically managed in the ER.  He has medical problems of asthma/COPD, chronic edema, hypertension, left hip arthroplasty. Review of outpatient medications suggest that patient was taking lisinopril and meloxicam. Nephrology consult has been requested for evaluation of acute kidney injury.  Patient's baseline creatinine appears to be 1.8/GFR 40 from December 04, 2020.  Recent creatinine trend suggests it has been increasing from 2.6-3.7 today Urinalysis on October 31 shows small blood, negative protein, 0-5 RBCs and 0-5 WBCs.  No recent renal imaging is available.  Review of Systems: ROS Gen: Denies any fevers or chills HEENT: No vision or hearing problems CV: No chest pain or shortness of breath at present. Now on room air Resp: No cough or sputum production GI: No nausea, vomiting or diarrhea.  No blood in the stool GU : Does report hesitancy and small volume of frequent urination.  No hematuria.   H/o kidney stones in his 20's MS: Denies any acute joint pain or swelling. C/o pain in feet and generalized body aches at home Derm:   No complaints Psych: No complaints Heme: No complaints Neuro: No complaints Endocrine: No complaints   Past Medical History:  Diagnosis Date   Asthma    Edema    Hip dislocation, bilateral (HCC)    Hypertension     Social History   Tobacco Use   Smoking status: Never   Smokeless tobacco: Never  Vaping Use   Vaping Use: Never used  Substance Use Topics   Alcohol use: Not Currently    Family History  Problem Relation Age of Onset    COPD Mother    Cancer Mother    Melanoma Father      OBJECTIVE: Blood pressure (!) 142/91, pulse 86, temperature 98.4 F (36.9 C), resp. rate 17, height 6\' 2"  (1.88 m), weight 131.4 kg, SpO2 97 %.  Physical Exam  Physical Exam: General:  No acute distress, laying in the bed  HEENT  anicteric, moist oral mucous membrane  Pulm/lungs  normal breathing effort, basilar crackles noted  CVS/Heart  regular rhythm, no rub or gallop, 2/6 systolic murmur  Abdomen:   Soft, nontender  Extremities: Trace peripheral edema  Neurologic:  Alert, oriented, able to follow commands  Skin:  No acute rashes     Lab Results Lab Results  Component Value Date   WBC 14.1 (H) 06/14/2021   HGB 11.8 (L) 06/14/2021   HCT 38.0 (L) 06/14/2021   MCV 104.1 (H) 06/14/2021   PLT 444 (H) 06/14/2021    Lab Results  Component Value Date   CREATININE 3.71 (H) 06/15/2021   BUN 90 (H) 06/15/2021   NA 133 (L) 06/15/2021   K 5.2 (H) 06/15/2021   CL 102 06/15/2021   CO2 22 06/15/2021    Lab Results  Component Value Date   ALT 17 06/12/2021   AST 25 06/12/2021   ALKPHOS 92 06/12/2021   BILITOT 1.3 (H) 06/12/2021     Microbiology: Recent Results (from the past 240 hour(s))  Resp Panel by RT-PCR (Flu  A&B, Covid) Nasopharyngeal Swab     Status: None   Collection Time: 06/12/21  6:35 PM   Specimen: Nasopharyngeal Swab; Nasopharyngeal(NP) swabs in vial transport medium  Result Value Ref Range Status   SARS Coronavirus 2 by RT PCR NEGATIVE NEGATIVE Final    Comment: (NOTE) SARS-CoV-2 target nucleic acids are NOT DETECTED.  The SARS-CoV-2 RNA is generally detectable in upper respiratory specimens during the acute phase of infection. The lowest concentration of SARS-CoV-2 viral copies this assay can detect is 138 copies/mL. A negative result does not preclude SARS-Cov-2 infection and should not be used as the sole basis for treatment or other patient management decisions. A negative result may occur with   improper specimen collection/handling, submission of specimen other than nasopharyngeal swab, presence of viral mutation(s) within the areas targeted by this assay, and inadequate number of viral copies(<138 copies/mL). A negative result must be combined with clinical observations, patient history, and epidemiological information. The expected result is Negative.  Fact Sheet for Patients:  EntrepreneurPulse.com.au  Fact Sheet for Healthcare Providers:  IncredibleEmployment.be  This test is no t yet approved or cleared by the [place] FDA and  has been authorized for detection and/or diagnosis of SARS-CoV-2 by FDA under an Emergency Use Authorization (EUA). This EUA will remain  in effect (meaning this test can be used) for the duration of the COVID-19 declaration under Section 564(b)(1) of the Act, 21 U.S.C.section 360bbb-3(b)(1), unless the authorization is terminated  or revoked sooner.       Influenza A by PCR NEGATIVE NEGATIVE Final   Influenza B by PCR NEGATIVE NEGATIVE Final    Comment: (NOTE) The Xpert Xpress SARS-CoV-2/FLU/RSV plus assay is intended as an aid in the diagnosis of influenza from Nasopharyngeal swab specimens and should not be used as a sole basis for treatment. Nasal washings and aspirates are unacceptable for Xpert Xpress SARS-CoV-2/FLU/RSV testing.  Fact Sheet for Patients: EntrepreneurPulse.com.au  Fact Sheet for Healthcare Providers: IncredibleEmployment.be  This test is not yet approved or cleared by the [place] FDA and has been authorized for detection and/or diagnosis of SARS-CoV-2 by FDA under an Emergency Use Authorization (EUA). This EUA will remain in effect (meaning this test can be used) for the duration of the COVID-19 declaration under Section 564(b)(1) of the Act, 21 U.S.C. section 360bbb-3(b)(1), unless the authorization is terminated  or revoked.  Performed at [facility], [address] [place], [place] [address]   Blood culture (routine x 2)     Status: None (Preliminary result)   Collection Time: 06/12/21  7:48 PM   Specimen: BLOOD  Result Value Ref Range Status   Specimen Description BLOOD RAC  Final   Special Requests   Final    BOTTLES DRAWN AEROBIC AND ANAEROBIC Blood Culture results may not be optimal due to an inadequate volume of blood received in culture bottles   Culture   Final    NO GROWTH 3 DAYS Performed at [facility], [address] [place],  [address]    Report Status PENDING  Incomplete  Blood culture (routine x 2)     Status: None (Preliminary result)   Collection Time: 06/12/21  7:48 PM   Specimen: BLOOD  Result Value Ref Range Status   Specimen Description BLOOD BLOOD LEFT HAND  Final   Special Requests   Final    BOTTLES DRAWN AEROBIC AND ANAEROBIC Blood Culture results may not be optimal due to an inadequate volume of blood received in culture  bottles   Culture   Final    NO GROWTH 3 DAYS Performed at [facility], [address] [place], [place] [address]    Report Status PENDING  Incomplete    Medications: Scheduled Meds:  apixaban  5 mg Oral BID   aspirin EC  81 mg Oral Daily   colchicine  0.3 mg Oral Daily   cyanocobalamin  1,000 mcg Intramuscular Daily   diltiazem  300 mg Oral Daily   docusate sodium  100 mg Oral BID   hydrALAZINE  10 mg Oral Q8H   insulin aspart  0-5 Units Subcutaneous QHS   insulin aspart  0-9 Units Subcutaneous TID WC   methylPREDNISolone (SOLU-MEDROL) injection  40 mg Intravenous Q12H   mometasone-formoterol  2 puff Inhalation BID   multivitamin with minerals  1 tablet Oral Daily   sodium chloride flush  3 mL Intravenous Q12H   sodium zirconium cyclosilicate  10 g Oral Once   Continuous Infusions: PRN Meds:.acetaminophen **OR** acetaminophen, albuterol, bisacodyl, hydrALAZINE, ondansetron (ZOFRAN) IV,  oxyCODONE, polyethylene glycol  Allergies  Allergen Reactions   Demerol [Meperidine Hcl]    Lisinopril Other (See Comments)    Hypotensive     Urinalysis: Recent Labs    06/13/21 0319  COLORURINE YELLOW*  LABSPEC 1.010  PHURINE 5.0  GLUCOSEU NEGATIVE  HGBUR SMALL*  BILIRUBINUR NEGATIVE  KETONESUR NEGATIVE  PROTEINUR NEGATIVE  NITRITE NEGATIVE  LEUKOCYTESUR TRACE*      Imaging: DG Foot 2 Views Right  Result Date: 06/14/2021 CLINICAL DATA:  Pain and swelling in the right foot EXAM: RIGHT FOOT - 2 VIEW COMPARISON:  None. FINDINGS: No acute fracture or dislocation identified. Degenerative changes at the medial aspect of the first metatarsophalangeal joint occluding small erosive change which could be seen with gout. Dorsal osteophytes in the midfoot. Plantar calcaneal spur. Calcific densities in the region of the proximal plantar fascia as well as in the distal Achilles tendon. Mild soft tissue swelling of the foot. IMPRESSION: 1. No acute osseous abnormality identified. 2. Multiple chronic findings as described. Electronically Signed   By: Ofilia Neas M.D.   On: 06/14/2021 15:06   ECHOCARDIOGRAM COMPLETE  Result Date: 06/13/2021    ECHOCARDIOGRAM REPORT   Patient Name:   Matthew [name] Date of Exam: 06/13/2021 Medical Rec #:  [id number]      Height:       74.0 in Accession #:    [id number]     Weight:       300.0 lb Date of Birth:  June 21, 1951       BSA:          2.582 m Patient Age:    67 years       BP:           120/87 mmHg Patient Gender: M              HR:           91 bpm. Exam Location:  [facility] Procedure: 2D Echo, Color Doppler and Cardiac Doppler Indications:     CHF-ACUTE SYSTOLIC 527.78 / E42.35  History:         Patient has prior history of Echocardiogram examinations, most                  recent 10/16/2018. Signs/Symptoms:Edema; Risk                  Factors:Hypertension.  Sonographer:     Sherrie Sport Referring Phys:  [phone]  Loletha Grayer Diagnosing Phys: Donnelly Angelica   Sonographer Comments: Suboptimal apical window. IMPRESSIONS  1. AF with RVR precludes accurate EF assessment. Left ventricular ejection fraction, by estimation, is 50 to 55%. The left ventricle has low normal function. The left ventricle has no regional wall motion abnormalities. Left ventricular diastolic parameters are indeterminate.  2. Right ventricular systolic function is normal. The right ventricular size is normal.  3. Left atrial size was mildly dilated.  4. The mitral valve is normal in structure. Trivial mitral valve regurgitation.  5. The aortic valve is normal in structure. Aortic valve regurgitation is not visualized. No aortic stenosis is present.  6. Aortic dilatation noted. There is mild dilatation of the aortic root, measuring 41 mm. FINDINGS  Left Ventricle: AF with RVR precludes accurate EF assessment. Left ventricular ejection fraction, by estimation, is 50 to 55%. The left ventricle has low normal function. The left ventricle has no regional wall motion abnormalities. The left ventricular  internal cavity size was normal in size. There is borderline left ventricular hypertrophy. Left ventricular diastolic parameters are indeterminate. Right Ventricle: The right ventricular size is normal. Right vetricular wall thickness was not well visualized. Right ventricular systolic function is normal. Left Atrium: Left atrial size was mildly dilated. Right Atrium: Right atrial size was normal in size. Pericardium: There is no evidence of pericardial effusion. Mitral Valve: The mitral valve is normal in structure. Trivial mitral valve regurgitation. Tricuspid Valve: The tricuspid valve is not well visualized. Tricuspid valve regurgitation is not demonstrated. Aortic Valve: The aortic valve is normal in structure. Aortic valve regurgitation is not visualized. No aortic stenosis is present. Aortic valve mean gradient measures 2.0 mmHg. Aortic valve peak gradient measures 3.7 mmHg. Aortic valve area, by VTI  measures 2.88 cm. Pulmonic Valve: The pulmonic valve was not well visualized. Pulmonic valve regurgitation is not visualized. No evidence of pulmonic stenosis. Aorta: Aortic dilatation noted. There is mild dilatation of the aortic root, measuring 41 mm. IAS/Shunts: The interatrial septum was not well visualized.  LEFT VENTRICLE PLAX 2D LVIDd:         6.00 cm LVIDs:         4.30 cm LV PW:         1.10 cm LV IVS:        1.00 cm LVOT diam:     2.00 cm LV SV:         35 LV SV Index:   14 LVOT Area:     3.14 cm  RIGHT VENTRICLE RV Basal diam:  2.40 cm RV S prime:     12.50 cm/s TAPSE (M-mode): 4.7 cm LEFT ATRIUM              Index        RIGHT ATRIUM           Index LA diam:        3.80 cm  1.47 cm/m   RA Area:     25.80 cm LA Vol (A2C):   137.0 ml 53.06 ml/m  RA Volume:   74.50 ml  28.85 ml/m LA Vol (A4C):   53.0 ml  20.53 ml/m LA Biplane Vol: 88.1 ml  34.12 ml/m  AORTIC VALVE                    PULMONIC VALVE AV Area (Vmax):    2.26 cm     PV Vmax:        0.67 m/s AV Area (Vmean):  2.24 cm     PV Vmean:       46.150 cm/s AV Area (VTI):     2.88 cm     PV VTI:         0.104 m AV Vmax:           96.10 cm/s   PV Peak grad:   1.8 mmHg AV Vmean:          69.500 cm/s  PV Mean grad:   1.0 mmHg AV VTI:            0.122 m      RVOT Peak grad: 3 mmHg AV Peak Grad:      3.7 mmHg AV Mean Grad:      2.0 mmHg LVOT Vmax:         69.20 cm/s LVOT Vmean:        49.500 cm/s LVOT VTI:          0.112 m LVOT/AV VTI ratio: 0.92  AORTA Ao Root diam: 4.10 cm MITRAL VALVE               TRICUSPID VALVE MV Area (PHT): 7.59 cm    TR Peak grad:   14.3 mmHg MV Decel Time: 100 msec    TR Vmax:        189.00 cm/s MV E velocity: 92.50 cm/s                            SHUNTS                            Systemic VTI:  0.11 m                            Systemic Diam: 2.00 cm                            Pulmonic VTI:  0.115 m Donnelly Angelica Electronically signed by Donnelly Angelica Signature Date/Time: 06/13/2021/12:14:34 PM    Final        Assessment/Plan:  Matthew [name] is a 70 y.o. male with medical problems of hypertension, atrial fibrillation, chronic edema, LVH,   was admitted on 06/12/2021 for :  SOB (shortness of breath) [R06.02] Bilateral leg edema [R60.0] Acute respiratory failure with hypoxia (HCC) [J96.01] Atrial fibrillation with RVR ([place]) [I48.91]  #Acute kidney injury on chronic kidney disease stage IIIb Baseline creatinine 1.81/GFR 40 from December 04, 2020 Urinalysis at admission appears benign Will obtain renal imaging  The cause for AKI is not entirely clear but possibly related to hemodynamic instability from A. fib and RVR.  Agree with holding lisinopril and meloxicam. Avoid nonsteroidals, IV contrast Avoid hypotension  #Chronic lower extremity edema No diuretics at home  #Hyperkalemia Likely from AKI. Agree with [facility]  #Hesitancy of urination Will add empiric tamsulosin.    Kristena Wilhelmi Candiss Norse 06/15/21

## 2021-06-15 NOTE — Progress Notes (Signed)
PROGRESS NOTE    Matthew [name]  VVO:[id number] DOB: 02-02-1951 DOA: 06/12/2021 PCP: [name], MD    Brief Narrative:  This 70 years old male with PMH significant for hypertension, asthma, A. fib on Eliquis, CKD stage IIIb, vitamin B12 deficiency, morbid obesity, presented in the ED with acute shortness of breath.  Patient was initially started on steroids and IV Lasix.  He was also found to have A. fib with RVR. Patient is mainly admitted for asthma exacerbation and has A. fib with RVR.  Assessment & Plan:   Principal Problem:   SOB (shortness of breath) Active Problems:   Moderate persistent asthma with exacerbation   Essential hypertension   COPD with acute exacerbation (HCC)   Edema of both legs   Atrial fibrillation with RVR (HCC)   Acute kidney injury superimposed on chronic kidney disease (HCC)   Elevated glucose level   Obesity (BMI 30-39.9)   Macrocytosis   Acute respiratory failure with hypoxia (HCC)   Acute gout of right ankle  Asthma exacerbation: Presented with acute shortness of breath,  found to have wheezing. Continue Solu-Medrol, DuoNeb and Dulera inhaler. Less likely CHF exacerbation, BNP 279, No pedal edema Echo: LVEF 50-55% No RWMA.  Atrial fibrillation with RVR:  Heart rate is now controlled.   Continue Cardizem 300 mg daily Continue Eliquis for anticoagulation.  Acute kidney injury on CKD stage IIIb: Baseline serum creatinine 1.5- 1.8 With diuresis  serum creatinine went up to 3.18. Avoid Nephrotoxic medications, Hold Lasix Nephrology consulted, awaiting recommendation.  Hyperkalemia:  Could be related to AKI on CKD. Lokelma x1 given.  Continue to monitor  Right foot pain: Likely gout with high uric acid. Continue Solu-Medrol, given colchicine and continue  low-dose on daily basis.  Vitamin B12 deficiency:  Level 149.  Continue B12 injections IM for 3 days then supplement orally.  Essential hypertension: Continue Cardizem and  hydralazine.  Morbid obesity BMI 38.50: Counseled on weight loss.  Recommend outpatient sleep studies.  Hyperglycemia: This could be secondary to steroids.  Hemoglobin A1c 5.9 Start regular insulin sliding scale.  Leukocytosis: Could be secondary to steroids.   DVT prophylaxis: Eliquis Code Status: Full code. Family Communication:No family at bed side. Disposition Plan:    Status is: Inpatient  Remains inpatient appropriate because: Asthma exacerbation.  AKI on CKD stage III.  Anticipated discharge home in few days.   Consultants:   Nephrology  Procedures: None Antimicrobials:  Anti-infectives (From admission, onward)    None        Subjective: Patient was seen and examined at bedside.  Overnight events noted.  Patient reports feeling much improved. He denies any pain.  He reports breathing has improved.  Objective: Vitals:   06/15/21 0113 06/15/21 0611 06/15/21 0801 06/15/21 1113  BP: (!) 155/87 139/89 (!) 142/91 (!) 141/85  Pulse: 62 93 86 61  Resp: 18 20 17 17   Temp: (!) 97.4 F (36.3 C) 97.6 F (36.4 C) 98.4 F (36.9 C) 98.4 F (36.9 C)  TempSrc:  Oral    SpO2: 94% 96% 97% 96%  Weight: 131.4 kg     Height:        Intake/Output Summary (Last 24 hours) at 06/15/2021 1158 Last data filed at 06/15/2021 1009 Gross per 24 hour  Intake 1320 ml  Output 1000 ml  Net 320 ml   Filed Weights   06/12/21 1828 06/14/21 0500 06/15/21 0113  Weight: 136.1 kg 136 kg 131.4 kg    Examination:  General exam:  Appears comfortable, not in any acute distress. Respiratory system: Clear to auscultation. Respiratory effort normal.  RR 15 Cardiovascular system: S1-S2 heard, regular rate and rhythm, no murmur.   Gastrointestinal system: Abdomen is soft, nontender, nondistended, BS +. Central nervous system: Alert and oriented x 3. No focal neurological deficits. Extremities: No edema, no cyanosis, no clubbing. Skin: No rashes, lesions or ulcers Psychiatry: Judgement  and insight appear normal. Mood & affect appropriate.     Data Reviewed: I have personally reviewed following labs and imaging studies  CBC: Recent Labs  Lab 06/12/21 1834 06/13/21 0319 06/14/21 0436  WBC 18.4* 11.3* 14.1*  NEUTROABS 15.2*  --   --   HGB 13.7 12.0* 11.8*  HCT 41.6 38.1* 38.0*  MCV 104.5* 105.0* 104.1*  PLT 482* 362 790*   Basic Metabolic Panel: Recent Labs  Lab 06/12/21 1834 06/13/21 0319 06/14/21 0436 06/15/21 0509 06/15/21 0842  NA 137 135 133* 133*  --   K 5.6* 4.7 5.1 5.2* 5.7*  CL 107 104 103 102  --   CO2 24 22 21* 22  --   GLUCOSE 128* 258* 194* 131*  --   BUN 44* 53* 67* 90*  --   CREATININE 2.62* 2.80* 3.18* 3.71*  --   CALCIUM 9.3 8.8* 9.2 8.8*  --   MG  --   --   --  2.4  --   PHOS  --   --   --  4.8*  --    GFR: Estimated Creatinine Clearance: 26.3 mL/min (A) (by C-G formula based on SCr of 3.71 mg/dL (H)). Liver Function Tests: Recent Labs  Lab 06/12/21 1834  AST 25  ALT 17  ALKPHOS 92  BILITOT 1.3*  PROT 7.8  ALBUMIN 2.4*   No results for input(s): LIPASE, AMYLASE in the last 168 hours. No results for input(s): AMMONIA in the last 168 hours. Coagulation Profile: No results for input(s): INR, PROTIME in the last 168 hours. Cardiac Enzymes: No results for input(s): CKTOTAL, CKMB, CKMBINDEX, TROPONINI in the last 168 hours. BNP (last 3 results) No results for input(s): PROBNP in the last 8760 hours. HbA1C: Recent Labs    06/13/21 0319  HGBA1C 5.9*   CBG: Recent Labs  Lab 06/14/21 1733 06/14/21 2020 06/15/21 0801 06/15/21 1113  GLUCAP 157* 139* 134* 191*   Lipid Profile: Recent Labs    06/13/21 0319  CHOL 101  HDL 40*  LDLCALC 47  TRIG 69  CHOLHDL 2.5   Thyroid Function Tests: Recent Labs    06/13/21 0319  TSH 1.783  FREET4 1.20*   Anemia Panel: Recent Labs    06/13/21 0319  VITAMINB12 149*  FOLATE 6.1   Sepsis Labs: Recent Labs  Lab 06/12/21 1948 06/13/21 0319  LATICACIDVEN 1.6 1.9     Recent Results (from the past 240 hour(s))  Resp Panel by RT-PCR (Flu A&B, Covid) Nasopharyngeal Swab     Status: None   Collection Time: 06/12/21  6:35 PM   Specimen: Nasopharyngeal Swab; Nasopharyngeal(NP) swabs in vial transport medium  Result Value Ref Range Status   SARS Coronavirus 2 by RT PCR NEGATIVE NEGATIVE Final    Comment: (NOTE) SARS-CoV-2 target nucleic acids are NOT DETECTED.  The SARS-CoV-2 RNA is generally detectable in upper respiratory specimens during the acute phase of infection. The lowest concentration of SARS-CoV-2 viral copies this assay can detect is 138 copies/mL. A negative result does not preclude SARS-Cov-2 infection and should not be used as the sole basis for treatment  or other patient management decisions. A negative result may occur with  improper specimen collection/handling, submission of specimen other than nasopharyngeal swab, presence of viral mutation(s) within the areas targeted by this assay, and inadequate number of viral copies(<138 copies/mL). A negative result must be combined with clinical observations, patient history, and epidemiological information. The expected result is Negative.  Fact Sheet for Patients:  EntrepreneurPulse.com.au  Fact Sheet for Healthcare Providers:  IncredibleEmployment.be  This test is no t yet approved or cleared by the [place] FDA and  has been authorized for detection and/or diagnosis of SARS-CoV-2 by FDA under an Emergency Use Authorization (EUA). This EUA will remain  in effect (meaning this test can be used) for the duration of the COVID-19 declaration under Section 564(b)(1) of the Act, 21 U.S.C.section 360bbb-3(b)(1), unless the authorization is terminated  or revoked sooner.       Influenza A by PCR NEGATIVE NEGATIVE Final   Influenza B by PCR NEGATIVE NEGATIVE Final    Comment: (NOTE) The Xpert Xpress SARS-CoV-2/FLU/RSV plus assay is intended as an  aid in the diagnosis of influenza from Nasopharyngeal swab specimens and should not be used as a sole basis for treatment. Nasal washings and aspirates are unacceptable for Xpert Xpress SARS-CoV-2/FLU/RSV testing.  Fact Sheet for Patients: EntrepreneurPulse.com.au  Fact Sheet for Healthcare Providers: IncredibleEmployment.be  This test is not yet approved or cleared by the [place] FDA and has been authorized for detection and/or diagnosis of SARS-CoV-2 by FDA under an Emergency Use Authorization (EUA). This EUA will remain in effect (meaning this test can be used) for the duration of the COVID-19 declaration under Section 564(b)(1) of the Act, 21 U.S.C. section 360bbb-3(b)(1), unless the authorization is terminated or revoked.  Performed at [facility], [address] [place], [place] [address]   Blood culture (routine x 2)     Status: None (Preliminary result)   Collection Time: 06/12/21  7:48 PM   Specimen: BLOOD  Result Value Ref Range Status   Specimen Description BLOOD RAC  Final   Special Requests   Final    BOTTLES DRAWN AEROBIC AND ANAEROBIC Blood Culture results may not be optimal due to an inadequate volume of blood received in culture bottles   Culture   Final    NO GROWTH 3 DAYS Performed at [facility], [address] [place], [place] [address]    Report Status PENDING  Incomplete  Blood culture (routine x 2)     Status: None (Preliminary result)   Collection Time: 06/12/21  7:48 PM   Specimen: BLOOD  Result Value Ref Range Status   Specimen Description BLOOD BLOOD LEFT HAND  Final   Special Requests   Final    BOTTLES DRAWN AEROBIC AND ANAEROBIC Blood Culture results may not be optimal due to an inadequate volume of blood received in culture bottles   Culture   Final    NO GROWTH 3 DAYS Performed at [facility], [address] [place], [place] [address]    Report Status PENDING   Incomplete    Radiology Studies: DG Foot 2 Views Right  Result Date: 06/14/2021 CLINICAL DATA:  Pain and swelling in the right foot EXAM: RIGHT FOOT - 2 VIEW COMPARISON:  None. FINDINGS: No acute fracture or dislocation identified. Degenerative changes at the medial aspect of the first metatarsophalangeal joint occluding small erosive change which could be seen with gout. Dorsal osteophytes in the midfoot. Plantar calcaneal spur. Calcific densities in the region  of the proximal plantar fascia as well as in the distal Achilles tendon. Mild soft tissue swelling of the foot. IMPRESSION: 1. No acute osseous abnormality identified. 2. Multiple chronic findings as described. Electronically Signed   By: Ofilia Neas M.D.   On: 06/14/2021 15:06    Scheduled Meds:  apixaban  5 mg Oral BID   aspirin EC  81 mg Oral Daily   colchicine  0.3 mg Oral Daily   diltiazem  300 mg Oral Daily   docusate sodium  100 mg Oral BID   hydrALAZINE  10 mg Oral Q8H   insulin aspart  0-5 Units Subcutaneous QHS   insulin aspart  0-9 Units Subcutaneous TID WC   methylPREDNISolone (SOLU-MEDROL) injection  40 mg Intravenous Q12H   mometasone-formoterol  2 puff Inhalation BID   multivitamin with minerals  1 tablet Oral Daily   sodium chloride flush  3 mL Intravenous Q12H   Continuous Infusions:   LOS: 3 days    Time spent: 35 Mins    Khushbu Pippen, MD [facility]   If 7PM-7AM, please contact night-coverage

## 2021-06-16 DIAGNOSIS — R0602 Shortness of breath: Secondary | ICD-10-CM | POA: Diagnosis not present

## 2021-06-16 LAB — BASIC METABOLIC PANEL
Anion gap: 8 (ref 5–15)
BUN: 97 mg/dL — ABNORMAL HIGH (ref 8–23)
CO2: 22 mmol/L (ref 22–32)
Calcium: 8.5 mg/dL — ABNORMAL LOW (ref 8.9–10.3)
Chloride: 102 mmol/L (ref 98–111)
Creatinine, Ser: 3.58 mg/dL — ABNORMAL HIGH (ref 0.61–1.24)
GFR, Estimated: 17 mL/min — ABNORMAL LOW (ref >60)
Glucose, Bld: 119 mg/dL — ABNORMAL HIGH (ref 70–99)
Potassium: 5 mmol/L (ref 3.5–5.1)
Sodium: 132 mmol/L — ABNORMAL LOW (ref 135–145)

## 2021-06-16 LAB — MAGNESIUM: Magnesium: 2.3 mg/dL (ref 1.7–2.4)

## 2021-06-16 LAB — CBC
HCT: 37.3 % — ABNORMAL LOW (ref 39.0–52.0)
Hemoglobin: 11.6 g/dL — ABNORMAL LOW (ref 13.0–17.0)
MCH: 32.6 pg (ref 26.0–34.0)
MCHC: 31.1 g/dL (ref 30.0–36.0)
MCV: 104.8 fL — ABNORMAL HIGH (ref 80.0–100.0)
Platelets: 390 10*3/uL (ref 150–400)
RBC: 3.56 MIL/uL — ABNORMAL LOW (ref 4.22–5.81)
RDW: 13.1 % (ref 11.5–15.5)
WBC: 15.6 10*3/uL — ABNORMAL HIGH (ref 4.0–10.5)
nRBC: 0 % (ref 0.0–0.2)

## 2021-06-16 LAB — GLUCOSE, CAPILLARY
Glucose-Capillary: 117 mg/dL — ABNORMAL HIGH (ref 70–99)
Glucose-Capillary: 168 mg/dL — ABNORMAL HIGH (ref 70–99)
Glucose-Capillary: 129 mg/dL — ABNORMAL HIGH (ref 70–99)
Glucose-Capillary: 138 mg/dL — ABNORMAL HIGH (ref 70–99)

## 2021-06-16 LAB — PHOSPHORUS: Phosphorus: 4.8 mg/dL — ABNORMAL HIGH (ref 2.5–4.6)

## 2021-06-16 MED ORDER — POLYETHYLENE GLYCOL 3350 17 G PO PACK
17.0000 g | PACK | Freq: Once | ORAL | Status: AC
  Administered 2021-06-16: 17 g via ORAL
  Filled 2021-06-16: qty 1

## 2021-06-16 NOTE — Progress Notes (Signed)
PROGRESS NOTE    Matthew [name]  TIW:[id number] DOB: 1951-06-26 DOA: 06/12/2021 PCP: [name], MD    Brief Narrative:  This 71 years old male with PMH significant for hypertension, asthma, A. fib on Eliquis, CKD stage IIIb, vitamin B12 deficiency, morbid obesity, presented in the ED with acute shortness of breath.  Patient was initially started on steroids and IV Lasix.  He was also found to have A. fib with RVR. Patient is mainly admitted for asthma exacerbation and has A. fib with RVR.  Serum creatinine has worsened requiring nephrology consult.  Assessment & Plan:   Principal Problem:   SOB (shortness of breath) Active Problems:   Moderate persistent asthma with exacerbation   Essential hypertension   COPD with acute exacerbation (HCC)   Edema of both legs   Atrial fibrillation with RVR (HCC)   Acute kidney injury superimposed on chronic kidney disease (HCC)   Elevated glucose level   Obesity (BMI 30-39.9)   Macrocytosis   Acute respiratory failure with hypoxia (HCC)   Acute gout of right ankle  Asthma exacerbation: Presented with acute shortness of breath,  found to have wheezing. Continue Solu-Medrol, DuoNeb and Dulera inhaler. Less likely CHF exacerbation, BNP 279, No pedal edema Echo: LVEF 50-55% No RWMA.  Atrial fibrillation with RVR:  Heart rate is now controlled.   Continue Cardizem 300 mg daily Continue Eliquis for anticoagulation.  Acute kidney injury on CKD stage IIIb: Baseline serum creatinine 1.5- 1.8 With diuresis  serum creatinine went up to 3.71. Avoid Nephrotoxic medications, Hold Lasix Nephrology consulted, obtain renal ultrasound. Renal ultrasound no hydronephrosis, medical renal disease.  Hyperkalemia: > Improved. Could be related to AKI on CKD. Lokelma x1 given.  Improved.  Right foot pain: Likely gout with high uric acid. Continue Solu-Medrol, given colchicine and continue  low-dose on daily basis.  Vitamin B12 deficiency:  Level  149.  Continue B12 injections IM for 3 days then supplement orally.  Essential hypertension: Continue Cardizem and hydralazine.  Morbid obesity BMI 38.50: Counseled on weight loss.  Recommend outpatient sleep studies.  Hyperglycemia: This could be secondary to steroids.  Hemoglobin A1c 5.9 Start regular insulin sliding scale.  Leukocytosis: Could be secondary to steroids.   DVT prophylaxis: Eliquis Code Status: Full code. Family Communication:No family at bed side. Disposition Plan:    Status is: Inpatient  Remains inpatient appropriate because: Asthma exacerbation.  AKI on CKD stage III.  Anticipated discharge home in few days.   Consultants:   Nephrology  Procedures: None Antimicrobials:  Anti-infectives (From admission, onward)    None        Subjective: Patient was seen and examined at bedside.  Overnight events noted.   He reports feeling much improved.  He is down to 2 L of supplemental oxygen. He denies any pain.  He reports breathing has improved.  Objective: Vitals:   06/16/21 0724 06/16/21 1110 06/16/21 1144 06/16/21 1230  BP: 131/85 (!) 151/98  (!) 143/94  Pulse: 72 97 (!) 107 (!) 106  Resp: 18 18  16   Temp: 98.2 F (36.8 C) 98 F (36.7 C)  98.3 F (36.8 C)  TempSrc:    Oral  SpO2: 91% 96% 99% 99%  Weight:      Height:        Intake/Output Summary (Last 24 hours) at 06/16/2021 1252 Last data filed at 06/16/2021 0950 Gross per 24 hour  Intake 123 ml  Output 1200 ml  Net -1077 ml   [employer]  06/14/21 0500 06/15/21 0113 06/16/21 0400  Weight: 136 kg 131.4 kg 129.8 kg    Examination:  General exam: Appears comfortable, not in any acute distress. Deconditioned. Respiratory system: Clear to auscultation. Respiratory effort normal.  RR 14 Cardiovascular system: S1-S2 heard, regular rate and rhythm, no murmur.   Gastrointestinal system: Abdomen is soft, nontender, nondistended, BS +. Central nervous system: Alert and oriented x 3.  No focal neurological deficits. Extremities: No edema, no cyanosis, no clubbing. Skin: No rashes, lesions or ulcers Psychiatry: Judgement and insight appear normal. Mood & affect appropriate.     Data Reviewed: I have personally reviewed following labs and imaging studies  CBC: Recent Labs  Lab 06/12/21 1834 06/13/21 0319 06/14/21 0436 06/16/21 0428  WBC 18.4* 11.3* 14.1* 15.6*  NEUTROABS 15.2*  --   --   --   HGB 13.7 12.0* 11.8* 11.6*  HCT 41.6 38.1* 38.0* 37.3*  MCV 104.5* 105.0* 104.1* 104.8*  PLT 482* 362 444* 130   Basic Metabolic Panel: Recent Labs  Lab 06/12/21 1834 06/13/21 0319 06/14/21 0436 06/15/21 0509 06/15/21 0842 06/16/21 0428  NA 137 135 133* 133*  --  132*  K 5.6* 4.7 5.1 5.2* 5.7* 5.0  CL 107 104 103 102  --  102  CO2 24 22 21* 22  --  22  GLUCOSE 128* 258* 194* 131*  --  119*  BUN 44* 53* 67* 90*  --  97*  CREATININE 2.62* 2.80* 3.18* 3.71*  --  3.58*  CALCIUM 9.3 8.8* 9.2 8.8*  --  8.5*  MG  --   --   --  2.4  --  2.3  PHOS  --   --   --  4.8*  --  4.8*   GFR: Estimated Creatinine Clearance: 27.1 mL/min (A) (by C-G formula based on SCr of 3.58 mg/dL (H)). Liver Function Tests: Recent Labs  Lab 06/12/21 1834  AST 25  ALT 17  ALKPHOS 92  BILITOT 1.3*  PROT 7.8  ALBUMIN 2.4*   No results for input(s): LIPASE, AMYLASE in the last 168 hours. No results for input(s): AMMONIA in the last 168 hours. Coagulation Profile: No results for input(s): INR, PROTIME in the last 168 hours. Cardiac Enzymes: No results for input(s): CKTOTAL, CKMB, CKMBINDEX, TROPONINI in the last 168 hours. BNP (last 3 results) No results for input(s): PROBNP in the last 8760 hours. HbA1C: No results for input(s): HGBA1C in the last 72 hours.  CBG: Recent Labs  Lab 06/15/21 1113 06/15/21 1617 06/15/21 2231 06/16/21 0725 06/16/21 1110  GLUCAP 191* 113* 141* 117* 168*   Lipid Profile: No results for input(s): CHOL, HDL, LDLCALC, TRIG, CHOLHDL, LDLDIRECT in  the last 72 hours.  Thyroid Function Tests: No results for input(s): TSH, T4TOTAL, FREET4, T3FREE, THYROIDAB in the last 72 hours.  Anemia Panel: No results for input(s): VITAMINB12, FOLATE, FERRITIN, TIBC, IRON, RETICCTPCT in the last 72 hours.  Sepsis Labs: Recent Labs  Lab 06/12/21 1948 06/13/21 0319  LATICACIDVEN 1.6 1.9    Recent Results (from the past 240 hour(s))  Resp Panel by RT-PCR (Flu A&B, Covid) Nasopharyngeal Swab     Status: None   Collection Time: 06/12/21  6:35 PM   Specimen: Nasopharyngeal Swab; Nasopharyngeal(NP) swabs in vial transport medium  Result Value Ref Range Status   SARS Coronavirus 2 by RT PCR NEGATIVE NEGATIVE Final    Comment: (NOTE) SARS-CoV-2 target nucleic acids are NOT DETECTED.  The SARS-CoV-2 RNA is generally detectable in upper respiratory specimens  during the acute phase of infection. The lowest concentration of SARS-CoV-2 viral copies this assay can detect is 138 copies/mL. A negative result does not preclude SARS-Cov-2 infection and should not be used as the sole basis for treatment or other patient management decisions. A negative result may occur with  improper specimen collection/handling, submission of specimen other than nasopharyngeal swab, presence of viral mutation(s) within the areas targeted by this assay, and inadequate number of viral copies(<138 copies/mL). A negative result must be combined with clinical observations, patient history, and epidemiological information. The expected result is Negative.  Fact Sheet for Patients:  EntrepreneurPulse.com.au  Fact Sheet for Healthcare Providers:  IncredibleEmployment.be  This test is no t yet approved or cleared by the [place] FDA and  has been authorized for detection and/or diagnosis of SARS-CoV-2 by FDA under an Emergency Use Authorization (EUA). This EUA will remain  in effect (meaning this test can be used) for the duration of  the COVID-19 declaration under Section 564(b)(1) of the Act, 21 U.S.C.section 360bbb-3(b)(1), unless the authorization is terminated  or revoked sooner.       Influenza A by PCR NEGATIVE NEGATIVE Final   Influenza B by PCR NEGATIVE NEGATIVE Final    Comment: (NOTE) The Xpert Xpress SARS-CoV-2/FLU/RSV plus assay is intended as an aid in the diagnosis of influenza from Nasopharyngeal swab specimens and should not be used as a sole basis for treatment. Nasal washings and aspirates are unacceptable for Xpert Xpress SARS-CoV-2/FLU/RSV testing.  Fact Sheet for Patients: EntrepreneurPulse.com.au  Fact Sheet for Healthcare Providers: IncredibleEmployment.be  This test is not yet approved or cleared by the [place] FDA and has been authorized for detection and/or diagnosis of SARS-CoV-2 by FDA under an Emergency Use Authorization (EUA). This EUA will remain in effect (meaning this test can be used) for the duration of the COVID-19 declaration under Section 564(b)(1) of the Act, 21 U.S.C. section 360bbb-3(b)(1), unless the authorization is terminated or revoked.  Performed at [facility], [address] [place], [place] [address]   Blood culture (routine x 2)     Status: None (Preliminary result)   Collection Time: 06/12/21  7:48 PM   Specimen: BLOOD  Result Value Ref Range Status   Specimen Description BLOOD RAC  Final   Special Requests   Final    BOTTLES DRAWN AEROBIC AND ANAEROBIC Blood Culture results may not be optimal due to an inadequate volume of blood received in culture bottles   Culture   Final    NO GROWTH 4 DAYS Performed at [facility], [address] [place], [place] [address]    Report Status PENDING  Incomplete  Blood culture (routine x 2)     Status: None (Preliminary result)   Collection Time: 06/12/21  7:48 PM   Specimen: BLOOD  Result Value Ref Range Status   Specimen Description BLOOD BLOOD  LEFT HAND  Final   Special Requests   Final    BOTTLES DRAWN AEROBIC AND ANAEROBIC Blood Culture results may not be optimal due to an inadequate volume of blood received in culture bottles   Culture   Final    NO GROWTH 4 DAYS Performed at [facility], [address] [place], [place] [address]    Report Status PENDING  Incomplete    Radiology Studies: US RENAL  Result Date: 06/15/2021 CLINICAL DATA:  AKI EXAM: RENAL / URINARY TRACT ULTRASOUND COMPLETE COMPARISON:  None. FINDINGS: Right Kidney: Renal measurements: 11.4 x 5 x 5.2  cm = volume: 156 mL. Echogenicity is increased. Cysts are present at the lower pole. No hydronephrosis visualized. Left Kidney: Renal measurements: 11.9 x 6.3 x 5.4 cm = volume: 213 mL. Echogenicity is increased. Cysts are present at the upper pole. No hydronephrosis visualized. Bladder: Appears normal for degree of bladder distention. Other: None. IMPRESSION: No hydronephrosis. Increased renal echogenicity suggesting medical renal disease. Electronically Signed   By: Macy Mis M.D.   On: 06/15/2021 15:10   DG Foot 2 Views Right  Result Date: 06/14/2021 CLINICAL DATA:  Pain and swelling in the right foot EXAM: RIGHT FOOT - 2 VIEW COMPARISON:  None. FINDINGS: No acute fracture or dislocation identified. Degenerative changes at the medial aspect of the first metatarsophalangeal joint occluding small erosive change which could be seen with gout. Dorsal osteophytes in the midfoot. Plantar calcaneal spur. Calcific densities in the region of the proximal plantar fascia as well as in the distal Achilles tendon. Mild soft tissue swelling of the foot. IMPRESSION: 1. No acute osseous abnormality identified. 2. Multiple chronic findings as described. Electronically Signed   By: Ofilia Neas M.D.   On: 06/14/2021 15:06    Scheduled Meds:  apixaban  5 mg Oral BID   aspirin EC  81 mg Oral Daily   colchicine  0.3 mg Oral Daily   diltiazem  300 mg Oral Daily    docusate sodium  100 mg Oral BID   hydrALAZINE  10 mg Oral Q8H   insulin aspart  0-5 Units Subcutaneous QHS   insulin aspart  0-9 Units Subcutaneous TID WC   methylPREDNISolone (SOLU-MEDROL) injection  40 mg Intravenous Q12H   mometasone-formoterol  2 puff Inhalation BID   multivitamin with minerals  1 tablet Oral Daily   sodium chloride flush  3 mL Intravenous Q12H   tamsulosin  0.4 mg Oral QPC supper   Continuous Infusions:   LOS: 4 days    Time spent: 25 Mins    Willies Laviolette, MD [facility]   If 7PM-7AM, please contact night-coverage

## 2021-06-16 NOTE — Progress Notes (Signed)
Physical Therapy Treatment Patient Details Name: Matthew [name] MRN: [id number] DOB: 11-03-1950 Today's Date: 06/16/2021   History of Present Illness Pt admitted for complaints of cough and SOB symptoms. HIstory includes HTN, asthma, COPD, and obesity.    PT Comments    Treatment focused on improving overall activity tolerance with mobility. Pt tolerated treatment fair, with further mobility limited secondary to decreased cardiopulmonary tolerance to activity. However, pt was able to improve overall assist levels, ambulation distance, and balance since IE. Pt was grossly supervision-min guard for mobility for safety, and was able to ambulate 30ft in room with RW. Pt with c/o feeling like his knees were going to buckle during session, therefore min guard provided for safety. Pt desat to 84% on 2L post gait, however, unknown accuracy as HR was not matching telemetry rate. Pt also with c/o chest pain post session; RN notified. Pt will continue to benefit from skilled acute PT services to address deficits for return to baseline function. Will continue to recommend SNF at DC.    Recommendations for follow up therapy are one component of a multi-disciplinary discharge planning process, led by the attending physician.  Recommendations may be updated based on patient status, additional functional criteria and insurance authorization.  Follow Up Recommendations  Skilled nursing-short term rehab (<3 hours/day)     Assistance Recommended at Discharge Intermittent Supervision/Assistance  Equipment Recommendations  Other (comment) (defer to post acute)    Recommendations for Other Services       Precautions / Restrictions Precautions Precautions: Fall     Mobility  Bed Mobility Overal bed mobility: Needs Assistance Bed Mobility: Supine to Sit     Supine to sit: Supervision;HOB elevated     General bed mobility comments: supervision for safety to sit EOB with HOB elevated and use of BUE for  support on bed rail; increased time/effort    Transfers Overall transfer level: Needs assistance Equipment used: Rolling walker (2 wheels) Transfers: Sit to/from Stand Sit to Stand: Min guard           General transfer comment: CGA for safety to stand from elevated bed with RW; verbal cues for safety, sequencing, and hand placement. Increased time/effort to achieve full upright standing.    Ambulation/Gait Ambulation/Gait assistance: Supervision;Min guard Gait Distance (Feet): 18 Feet Assistive device: Rolling walker (2 wheels)       General Gait Details: Intermittent assist ranging from supervision-CGA for safety to walk short distance with RW. Demonstrates decreased step length/foot clearance bil, early reciprocal gait, slowed cadence, increased UE reliance on RW, and mild FF posture. Desat to 84% on 2L post gait; unknown accuracy due to HR not matching telemetry     Balance Overall balance assessment: History of Falls;Needs assistance Sitting-balance support: Bilateral upper extremity supported;Feet supported Sitting balance-Leahy Scale: Good     Standing balance support: Bilateral upper extremity supported;During functional activity Standing balance-Leahy Scale: Fair Standing balance comment: in RW; slowed cadence and labored breathing                            Cognition Arousal/Alertness: Awake/alert Behavior During Therapy: WFL for tasks assessed/performed Overall Cognitive Status: Within Functional Limits for tasks assessed                                          Exercises Other Exercises Other Exercises: Participated  in bed mobility, transfers, and limited gait at bedside with RW. Grossly supervision for safety. Limited by fatigue/cardiopulmonary tolerance to activity. Other Exercises: Pt educated re: PT role/POC, DC recommendations, breathing techniques, safety with transfers, benefits of OOB mobility/sitting in recliner, K+ levels  and signs/symptoms. He verbalized understanding.    General Comments General comments (skin integrity, edema, etc.): SpO2 dropped to 84% on 2L post gait; unknown accuracy as HR was not matching telemetry. Endorses SOB that improved with rest and cues for PLB.      Pertinent Vitals/Pain Pain Assessment: Faces Faces Pain Scale: Hurts a little bit Pain Location: bil chest pain post session, not associated with breathing (RN notified) Pain Intervention(s): Monitored during session;Repositioned     PT Goals (current goals can now be found in the care plan section) Acute Rehab PT Goals Patient Stated Goal: to get stronger PT Goal Formulation: With patient Time For Goal Achievement: 06/27/21 Potential to Achieve Goals: Good Progress towards PT goals: Progressing toward goals    Frequency    Min 2X/week      PT Plan Current plan remains appropriate       AM-PAC PT "6 Clicks" Mobility   Outcome Measure  Help needed turning from your back to your side while in a flat bed without using bedrails?: None Help needed moving from lying on your back to sitting on the side of a flat bed without using bedrails?: A Little Help needed moving to and from a bed to a chair (including a wheelchair)?: None Help needed standing up from a chair using your arms (e.g., wheelchair or bedside chair)?: A Little Help needed to walk in hospital room?: A Little Help needed climbing 3-5 steps with a railing? : Total 6 Click Score: 18    End of Session Equipment Utilized During Treatment: Oxygen;Gait belt (2L) Activity Tolerance: Patient tolerated treatment well;Patient limited by fatigue Patient left: in chair;with call bell/phone within reach;with chair alarm set Nurse Communication: Mobility status PT Visit Diagnosis: Muscle weakness (generalized) (M62.81);History of falling (Z91.81);Difficulty in walking, not elsewhere classified (R26.2)     Time: 9794-8016 PT Time Calculation (min) (ACUTE ONLY):  18 min  Charges:  $Therapeutic Exercise: 8-22 mins                        Herminio Commons, PT, DPT 11:53 AM,06/16/21

## 2021-06-16 NOTE — Plan of Care (Signed)
Patient confused this night, this morning the nurse tech assisted patient from bed to standing scale, patient oxygen dropped to 87% and had difficult breathing. Given oxygen 2 LNC patient stated feeling much better and maintained oxygen above 92%  Problem: Health Behavior/Discharge Planning: Goal: Ability to manage health-related needs will improve Outcome: Progressing   Problem: Clinical Measurements: Goal: Ability to maintain clinical measurements within normal limits will improve Outcome: Progressing Goal: Will remain free from infection Outcome: Progressing Goal: Diagnostic test results will improve Outcome: Progressing Goal: Respiratory complications will improve Outcome: Progressing Goal: Cardiovascular complication will be avoided Outcome: Progressing   Problem: Health Behavior/Discharge Planning: Goal: Ability to manage health-related needs will improve Outcome: Progressing   Problem: Clinical Measurements: Goal: Diagnostic test results will improve Outcome: Progressing   Problem: Clinical Measurements: Goal: Diagnostic test results will improve Outcome: Progressing

## 2021-06-16 NOTE — Consult Note (Signed)
Pt counseled on apixaban. Coupon and handout was given to the patient.   Eleonore Chiquito, PharmD, BCPS

## 2021-06-16 NOTE — Progress Notes (Signed)
[facility]  ROUNDING NOTE   Subjective:   Patient seen sitting up in bed, preparing to take a shower Tolerating meals Denies nausea and vomiting Denies shortness of breath Creatinine improving Recorded urine output of 1.2L in 24 hours   Objective:  Vital signs in last 24 hours:  Temp:  [97.5 F (36.4 C)-98.4 F (36.9 C)] 98.2 F (36.8 C) (11/03 0724) Pulse Rate:  [61-104] 72 (11/03 0724) Resp:  [17-22] 18 (11/03 0724) BP: (117-146)/(74-90) 131/85 (11/03 0724) SpO2:  [91 %-98 %] 91 % (11/03 0724) Weight:  [129.8 kg] 129.8 kg (11/03 0400)  Weight change: -1.6 kg Filed Weights   06/14/21 0500 06/15/21 0113 06/16/21 0400  Weight: 136 kg 131.4 kg 129.8 kg    Intake/Output: I/O last 3 completed shifts: In: 371 [P.O.:840; I.V.:3] Out: 1950 [Urine:1950]   Intake/Output this shift:  No intake/output data recorded.  Physical Exam: General: NAD  Head: Normocephalic, atraumatic. Moist oral mucosal membranes  Eyes: Anicteric  Lungs:  Clear to auscultation, normal effort  Heart: Regular rate and rhythm  Abdomen:  Soft, nontender  Extremities:  no peripheral edema.  Neurologic: Nonfocal, moving all four extremities  Skin: No lesions       Basic Metabolic Panel: Recent Labs  Lab 06/12/21 1834 06/13/21 0319 06/14/21 0436 06/15/21 0509 06/15/21 0842 06/16/21 0428  NA 137 135 133* 133*  --  132*  K 5.6* 4.7 5.1 5.2* 5.7* 5.0  CL 107 104 103 102  --  102  CO2 24 22 21* 22  --  22  GLUCOSE 128* 258* 194* 131*  --  119*  BUN 44* 53* 67* 90*  --  97*  CREATININE 2.62* 2.80* 3.18* 3.71*  --  3.58*  CALCIUM 9.3 8.8* 9.2 8.8*  --  8.5*  MG  --   --   --  2.4  --  2.3  PHOS  --   --   --  4.8*  --  4.8*    Liver Function Tests: Recent Labs  Lab 06/12/21 1834  AST 25  ALT 17  ALKPHOS 92  BILITOT 1.3*  PROT 7.8  ALBUMIN 2.4*   No results for input(s): LIPASE, AMYLASE in the last 168 hours. No results for input(s): AMMONIA in the last 168  hours.  CBC: Recent Labs  Lab 06/12/21 1834 06/13/21 0319 06/14/21 0436 06/16/21 0428  WBC 18.4* 11.3* 14.1* 15.6*  NEUTROABS 15.2*  --   --   --   HGB 13.7 12.0* 11.8* 11.6*  HCT 41.6 38.1* 38.0* 37.3*  MCV 104.5* 105.0* 104.1* 104.8*  PLT 482* 362 444* 390    Cardiac Enzymes: No results for input(s): CKTOTAL, CKMB, CKMBINDEX, TROPONINI in the last 168 hours.  BNP: Invalid input(s): POCBNP  CBG: Recent Labs  Lab 06/15/21 0801 06/15/21 1113 06/15/21 1617 06/15/21 2231 06/16/21 0725  GLUCAP 134* 191* 113* 141* 117*    Microbiology: Results for orders placed or performed during the hospital encounter of 06/12/21  Resp Panel by RT-PCR (Flu A&B, Covid) Nasopharyngeal Swab     Status: None   Collection Time: 06/12/21  6:35 PM   Specimen: Nasopharyngeal Swab; Nasopharyngeal(NP) swabs in vial transport medium  Result Value Ref Range Status   SARS Coronavirus 2 by RT PCR NEGATIVE NEGATIVE Final    Comment: (NOTE) SARS-CoV-2 target nucleic acids are NOT DETECTED.  The SARS-CoV-2 RNA is generally detectable in upper respiratory specimens during the acute phase of infection. The lowest concentration of SARS-CoV-2 viral copies this  assay can detect is 138 copies/mL. A negative result does not preclude SARS-Cov-2 infection and should not be used as the sole basis for treatment or other patient management decisions. A negative result may occur with  improper specimen collection/handling, submission of specimen other than nasopharyngeal swab, presence of viral mutation(s) within the areas targeted by this assay, and inadequate number of viral copies(<138 copies/mL). A negative result must be combined with clinical observations, patient history, and epidemiological information. The expected result is Negative.  Fact Sheet for Patients:  EntrepreneurPulse.com.au  Fact Sheet for Healthcare Providers:  IncredibleEmployment.be  This test  is no t yet approved or cleared by the [place] FDA and  has been authorized for detection and/or diagnosis of SARS-CoV-2 by FDA under an Emergency Use Authorization (EUA). This EUA will remain  in effect (meaning this test can be used) for the duration of the COVID-19 declaration under Section 564(b)(1) of the Act, 21 U.S.C.section 360bbb-3(b)(1), unless the authorization is terminated  or revoked sooner.       Influenza A by PCR NEGATIVE NEGATIVE Final   Influenza B by PCR NEGATIVE NEGATIVE Final    Comment: (NOTE) The Xpert Xpress SARS-CoV-2/FLU/RSV plus assay is intended as an aid in the diagnosis of influenza from Nasopharyngeal swab specimens and should not be used as a sole basis for treatment. Nasal washings and aspirates are unacceptable for Xpert Xpress SARS-CoV-2/FLU/RSV testing.  Fact Sheet for Patients: EntrepreneurPulse.com.au  Fact Sheet for Healthcare Providers: IncredibleEmployment.be  This test is not yet approved or cleared by the [place] FDA and has been authorized for detection and/or diagnosis of SARS-CoV-2 by FDA under an Emergency Use Authorization (EUA). This EUA will remain in effect (meaning this test can be used) for the duration of the COVID-19 declaration under Section 564(b)(1) of the Act, 21 U.S.C. section 360bbb-3(b)(1), unless the authorization is terminated or revoked.  Performed at [facility], [address] [place], [place] [address]   Blood culture (routine x 2)     Status: None (Preliminary result)   Collection Time: 06/12/21  7:48 PM   Specimen: BLOOD  Result Value Ref Range Status   Specimen Description BLOOD RAC  Final   Special Requests   Final    BOTTLES DRAWN AEROBIC AND ANAEROBIC Blood Culture results may not be optimal due to an inadequate volume of blood received in culture bottles   Culture   Final    NO GROWTH 4 DAYS Performed at [facility], [address] [place], [place] [address]    Report Status PENDING  Incomplete  Blood culture (routine x 2)     Status: None (Preliminary result)   Collection Time: 06/12/21  7:48 PM   Specimen: BLOOD  Result Value Ref Range Status   Specimen Description BLOOD BLOOD LEFT HAND  Final   Special Requests   Final    BOTTLES DRAWN AEROBIC AND ANAEROBIC Blood Culture results may not be optimal due to an inadequate volume of blood received in culture bottles   Culture   Final    NO GROWTH 4 DAYS Performed at [facility], [address] [place], [place] [address]    Report Status PENDING  Incomplete    Coagulation Studies: No results for input(s): LABPROT, INR in the last 72 hours.  Urinalysis: No results for input(s): COLORURINE, LABSPEC, PHURINE, GLUCOSEU, HGBUR, BILIRUBINUR, KETONESUR, PROTEINUR, UROBILINOGEN, NITRITE, LEUKOCYTESUR in the last 72 hours.  Invalid input(s): APPERANCEUR    Imaging: US RENAL  Result Date: 06/15/2021 CLINICAL DATA:  AKI EXAM: RENAL / URINARY TRACT ULTRASOUND COMPLETE COMPARISON:  None. FINDINGS: Right Kidney: Renal measurements: 11.4 x 5 x 5.2 cm = volume: 156 mL. Echogenicity is increased. Cysts are present at the lower pole. No hydronephrosis visualized. Left Kidney: Renal measurements: 11.9 x 6.3 x 5.4 cm = volume: 213 mL. Echogenicity is increased. Cysts are present at the upper pole. No hydronephrosis visualized. Bladder: Appears normal for degree of bladder distention. Other: None. IMPRESSION: No hydronephrosis. Increased renal echogenicity suggesting medical renal disease. Electronically Signed   By: Macy Mis M.D.   On: 06/15/2021 15:10   DG Foot 2 Views Right  Result Date: 06/14/2021 CLINICAL DATA:  Pain and swelling in the right foot EXAM: RIGHT FOOT - 2 VIEW COMPARISON:  None. FINDINGS: No acute fracture or dislocation identified. Degenerative changes at the medial aspect of the first metatarsophalangeal joint occluding small erosive change  which could be seen with gout. Dorsal osteophytes in the midfoot. Plantar calcaneal spur. Calcific densities in the region of the proximal plantar fascia as well as in the distal Achilles tendon. Mild soft tissue swelling of the foot. IMPRESSION: 1. No acute osseous abnormality identified. 2. Multiple chronic findings as described. Electronically Signed   By: Ofilia Neas M.D.   On: 06/14/2021 15:06     Medications:     apixaban  5 mg Oral BID   aspirin EC  81 mg Oral Daily   colchicine  0.3 mg Oral Daily   diltiazem  300 mg Oral Daily   docusate sodium  100 mg Oral BID   hydrALAZINE  10 mg Oral Q8H   insulin aspart  0-5 Units Subcutaneous QHS   insulin aspart  0-9 Units Subcutaneous TID WC   methylPREDNISolone (SOLU-MEDROL) injection  40 mg Intravenous Q12H   mometasone-formoterol  2 puff Inhalation BID   multivitamin with minerals  1 tablet Oral Daily   polyethylene glycol  17 g Oral Once   sodium chloride flush  3 mL Intravenous Q12H   tamsulosin  0.4 mg Oral QPC supper   acetaminophen **OR** acetaminophen, albuterol, bisacodyl, hydrALAZINE, ondansetron (ZOFRAN) IV, oxyCODONE, polyethylene glycol  Assessment/ Plan:  [name] is a 70 y.o.  male with medical problems of hypertension, atrial fibrillation, chronic edema, LVH,   was admitted on 06/12/2021 for :SOB (shortness of breath) [R06.02] Bilateral leg edema [R60.0] Acute respiratory failure with hypoxia ([place]) [J96.01] Atrial fibrillation with RVR ([place]) [I48.91]   Acute kidney injury on chronic kidney disease stage IIIb Baseline creatinine 1.81/GFR 40 from December 04, 2020 The cause for AKI is not entirely clear but possibly related to hemodynamic instability from A. fib and RVR. Urinalysis at admission appears benign Renal imaging negative for obstruction. Creatinine slightly improved.    Agree with holding lisinopril and meloxicam. Avoid nonsteroidals, IV contrast Avoid hypotension Will require discharge  follow up with our office.    2. Chronic lower extremity edema No diuretics at home   3. Hyperkalemia/hyponatremia Likely from AKI. Lokelma given yesterday, potassium 5.0.  Sodium 132, will monitor   4. Hesitancy of urination Tamsulosin added yesterday    LOS: 4   11/3/202210:03 AM

## 2021-06-17 ENCOUNTER — Inpatient Hospital Stay: Payer: Medicare HMO

## 2021-06-17 DIAGNOSIS — R0602 Shortness of breath: Secondary | ICD-10-CM | POA: Diagnosis not present

## 2021-06-17 LAB — CBC WITH DIFFERENTIAL/PLATELET
Abs Immature Granulocytes: 0.33 10*3/uL — ABNORMAL HIGH (ref 0.00–0.07)
Basophils Absolute: 0 10*3/uL (ref 0.0–0.1)
Basophils Relative: 0 %
Eosinophils Absolute: 0 10*3/uL (ref 0.0–0.5)
Eosinophils Relative: 0 %
HCT: 38.6 % — ABNORMAL LOW (ref 39.0–52.0)
Hemoglobin: 12.1 g/dL — ABNORMAL LOW (ref 13.0–17.0)
Immature Granulocytes: 2 %
Lymphocytes Relative: 4 %
Lymphs Abs: 0.7 10*3/uL (ref 0.7–4.0)
MCH: 32.6 pg (ref 26.0–34.0)
MCHC: 31.3 g/dL (ref 30.0–36.0)
MCV: 104 fL — ABNORMAL HIGH (ref 80.0–100.0)
Monocytes Absolute: 0.7 10*3/uL (ref 0.1–1.0)
Monocytes Relative: 4 %
Neutro Abs: 13.8 10*3/uL — ABNORMAL HIGH (ref 1.7–7.7)
Neutrophils Relative %: 90 %
Platelets: 354 10*3/uL (ref 150–400)
RBC: 3.71 MIL/uL — ABNORMAL LOW (ref 4.22–5.81)
RDW: 13.3 % (ref 11.5–15.5)
WBC: 15.5 10*3/uL — ABNORMAL HIGH (ref 4.0–10.5)
nRBC: 0 % (ref 0.0–0.2)

## 2021-06-17 LAB — CBC
HCT: 38.3 % — ABNORMAL LOW (ref 39.0–52.0)
Hemoglobin: 12.1 g/dL — ABNORMAL LOW (ref 13.0–17.0)
MCH: 33.2 pg (ref 26.0–34.0)
MCHC: 31.6 g/dL (ref 30.0–36.0)
MCV: 105.2 fL — ABNORMAL HIGH (ref 80.0–100.0)
Platelets: 335 10*3/uL (ref 150–400)
RBC: 3.64 MIL/uL — ABNORMAL LOW (ref 4.22–5.81)
RDW: 13.2 % (ref 11.5–15.5)
WBC: 11.8 10*3/uL — ABNORMAL HIGH (ref 4.0–10.5)
nRBC: 0 % (ref 0.0–0.2)

## 2021-06-17 LAB — BASIC METABOLIC PANEL
Anion gap: 9 (ref 5–15)
BUN: 97 mg/dL — ABNORMAL HIGH (ref 8–23)
CO2: 22 mmol/L (ref 22–32)
Calcium: 8.6 mg/dL — ABNORMAL LOW (ref 8.9–10.3)
Chloride: 103 mmol/L (ref 98–111)
Creatinine, Ser: 3.12 mg/dL — ABNORMAL HIGH (ref 0.61–1.24)
GFR, Estimated: 21 mL/min — ABNORMAL LOW (ref >60)
Glucose, Bld: 148 mg/dL — ABNORMAL HIGH (ref 70–99)
Potassium: 6.1 mmol/L — ABNORMAL HIGH (ref 3.5–5.1)
Sodium: 134 mmol/L — ABNORMAL LOW (ref 135–145)

## 2021-06-17 LAB — BLOOD GAS, ARTERIAL
Acid-base deficit: 4.3 mmol/L — ABNORMAL HIGH (ref 0.0–2.0)
Bicarbonate: 21.6 mmol/L (ref 20.0–28.0)
FIO2: 0.28
O2 Saturation: 98 %
Patient temperature: 37
pCO2 arterial: 42 mmHg (ref 32.0–48.0)
pH, Arterial: 7.32 — ABNORMAL LOW (ref 7.350–7.450)
pO2, Arterial: 112 mmHg — ABNORMAL HIGH (ref 83.0–108.0)

## 2021-06-17 LAB — CULTURE, BLOOD (ROUTINE X 2)
Culture: NO GROWTH
Culture: NO GROWTH

## 2021-06-17 LAB — GLUCOSE, CAPILLARY
Glucose-Capillary: 104 mg/dL — ABNORMAL HIGH (ref 70–99)
Glucose-Capillary: 128 mg/dL — ABNORMAL HIGH (ref 70–99)
Glucose-Capillary: 175 mg/dL — ABNORMAL HIGH (ref 70–99)
Glucose-Capillary: 195 mg/dL — ABNORMAL HIGH (ref 70–99)
Glucose-Capillary: 97 mg/dL (ref 70–99)

## 2021-06-17 LAB — MAGNESIUM: Magnesium: 2.8 mg/dL — ABNORMAL HIGH (ref 1.7–2.4)

## 2021-06-17 LAB — PHOSPHORUS: Phosphorus: 5.2 mg/dL — ABNORMAL HIGH (ref 2.5–4.6)

## 2021-06-17 LAB — D-DIMER, QUANTITATIVE: D-Dimer, Quant: 0.59 ug/mL-FEU — ABNORMAL HIGH (ref 0.00–0.50)

## 2021-06-17 LAB — BRAIN NATRIURETIC PEPTIDE: B Natriuretic Peptide: 181.2 pg/mL — ABNORMAL HIGH (ref 0.0–100.0)

## 2021-06-17 LAB — PROCALCITONIN: Procalcitonin: 5.28 ng/mL

## 2021-06-17 MED ORDER — SODIUM ZIRCONIUM CYCLOSILICATE 10 G PO PACK
10.0000 g | PACK | Freq: Two times a day (BID) | ORAL | Status: AC
  Administered 2021-06-17 – 2021-06-18 (×4): 10 g via ORAL
  Filled 2021-06-17 (×4): qty 1

## 2021-06-17 MED ORDER — LACTULOSE 10 GM/15ML PO SOLN
30.0000 g | Freq: Once | ORAL | Status: AC
  Administered 2021-06-17: 30 g via ORAL
  Filled 2021-06-17: qty 60

## 2021-06-17 MED ORDER — AZITHROMYCIN 250 MG PO TABS
500.0000 mg | ORAL_TABLET | Freq: Every day | ORAL | Status: AC
  Administered 2021-06-18: 500 mg via ORAL
  Filled 2021-06-17: qty 2

## 2021-06-17 MED ORDER — MELATONIN 5 MG PO TABS
5.0000 mg | ORAL_TABLET | Freq: Every day | ORAL | Status: DC
  Administered 2021-06-17 – 2021-06-22 (×6): 5 mg via ORAL
  Filled 2021-06-17 (×6): qty 1

## 2021-06-17 MED ORDER — ALPRAZOLAM 0.25 MG PO TABS
0.2500 mg | ORAL_TABLET | Freq: Once | ORAL | Status: AC
  Administered 2021-06-17: 0.25 mg via ORAL
  Filled 2021-06-17: qty 1

## 2021-06-17 MED ORDER — SENNOSIDES-DOCUSATE SODIUM 8.6-50 MG PO TABS
2.0000 | ORAL_TABLET | Freq: Once | ORAL | Status: AC
  Administered 2021-06-17: 2 via ORAL
  Filled 2021-06-17: qty 2

## 2021-06-17 MED ORDER — BISACODYL 5 MG PO TBEC
5.0000 mg | DELAYED_RELEASE_TABLET | Freq: Once | ORAL | Status: AC
  Administered 2021-06-17: 5 mg via ORAL
  Filled 2021-06-17: qty 1

## 2021-06-17 MED ORDER — LIDOCAINE VISCOUS HCL 2 % MT SOLN
15.0000 mL | Freq: Once | OROMUCOSAL | Status: AC
  Administered 2021-06-17: 15 mL via ORAL
  Filled 2021-06-17: qty 15

## 2021-06-17 MED ORDER — POLYETHYLENE GLYCOL 3350 17 G PO PACK
17.0000 g | PACK | Freq: Once | ORAL | Status: AC
  Administered 2021-06-17: 17 g via ORAL
  Filled 2021-06-17: qty 1

## 2021-06-17 MED ORDER — SODIUM CHLORIDE 0.9 % IV SOLN
1.0000 g | INTRAVENOUS | Status: AC
  Administered 2021-06-17 – 2021-06-23 (×6): 1 g via INTRAVENOUS
  Filled 2021-06-17 (×2): qty 1
  Filled 2021-06-17 (×4): qty 10

## 2021-06-17 MED ORDER — AZITHROMYCIN 250 MG PO TABS
250.0000 mg | ORAL_TABLET | Freq: Every day | ORAL | Status: AC
  Administered 2021-06-19 – 2021-06-22 (×4): 250 mg via ORAL
  Filled 2021-06-17 (×4): qty 1

## 2021-06-17 MED ORDER — METHYLPREDNISOLONE SODIUM SUCC 40 MG IJ SOLR
40.0000 mg | Freq: Every day | INTRAMUSCULAR | Status: DC
  Administered 2021-06-18 – 2021-06-20 (×3): 40 mg via INTRAVENOUS
  Filled 2021-06-17 (×3): qty 1

## 2021-06-17 MED ORDER — ALUM & MAG HYDROXIDE-SIMETH 200-200-20 MG/5ML PO SUSP
30.0000 mL | Freq: Once | ORAL | Status: AC
  Administered 2021-06-17: 30 mL via ORAL
  Filled 2021-06-17: qty 30

## 2021-06-17 MED ORDER — FUROSEMIDE 10 MG/ML IJ SOLN
20.0000 mg | Freq: Once | INTRAMUSCULAR | Status: AC
Start: 2021-06-17 — End: 2021-06-17
  Administered 2021-06-17: 20 mg via INTRAVENOUS
  Filled 2021-06-17: qty 2

## 2021-06-17 NOTE — Progress Notes (Signed)
[facility]  ROUNDING NOTE   Subjective:   Patient resting in bed, alert and oriented Tolerating small meals Denies shortness of breath  Creatinine greatly improved  Objective:  Vital signs in last 24 hours:  Temp:  [97.5 F (36.4 C)-98.3 F (36.8 C)] 98.2 F (36.8 C) (11/04 0755) Pulse Rate:  [83-109] 86 (11/04 0755) Resp:  [16-18] 17 (11/04 0755) BP: (106-151)/(68-98) 131/87 (11/04 0755) SpO2:  [55 %-99 %] 55 % (11/04 0755) Weight:  [128.5 kg] 128.5 kg (11/03 2329)  Weight change: -1.3 kg Filed Weights   06/15/21 0113 06/16/21 0400 06/16/21 2329  Weight: 131.4 kg 129.8 kg 128.5 kg    Intake/Output: I/O last 3 completed shifts: In: 323 [P.O.:840; I.V.:3] Out: 1625 [Urine:1625]   Intake/Output this shift:  No intake/output data recorded.  Physical Exam: General: NAD  Head: Normocephalic, atraumatic. Moist oral mucosal membranes  Eyes: Anicteric  Lungs:  Clear to auscultation, normal effort  Heart: Regular rate and rhythm  Abdomen:  Soft, nontender  Extremities:  no peripheral edema.  Neurologic: Nonfocal, moving all four extremities  Skin: No lesions       Basic Metabolic Panel: Recent Labs  Lab 06/13/21 0319 06/14/21 0436 06/15/21 0509 06/15/21 0842 06/16/21 0428 06/17/21 0924  NA 135 133* 133*  --  132* 134*  K 4.7 5.1 5.2* 5.7* 5.0 6.1*  CL 104 103 102  --  102 103  CO2 22 21* 22  --  22 22  GLUCOSE 258* 194* 131*  --  119* 148*  BUN 53* 67* 90*  --  97* 97*  CREATININE 2.80* 3.18* 3.71*  --  3.58* 3.12*  CALCIUM 8.8* 9.2 8.8*  --  8.5* 8.6*  MG  --   --  2.4  --  2.3 2.8*  PHOS  --   --  4.8*  --  4.8* 5.2*     Liver Function Tests: Recent Labs  Lab 06/12/21 1834  AST 25  ALT 17  ALKPHOS 92  BILITOT 1.3*  PROT 7.8  ALBUMIN 2.4*    No results for input(s): LIPASE, AMYLASE in the last 168 hours. No results for input(s): AMMONIA in the last 168 hours.  CBC: Recent Labs  Lab 06/12/21 1834 06/13/21 0319  06/14/21 0436 06/16/21 0428 06/17/21 0924  WBC 18.4* 11.3* 14.1* 15.6* 11.8*  NEUTROABS 15.2*  --   --   --   --   HGB 13.7 12.0* 11.8* 11.6* 12.1*  HCT 41.6 38.1* 38.0* 37.3* 38.3*  MCV 104.5* 105.0* 104.1* 104.8* 105.2*  PLT 482* 362 444* 390 335     Cardiac Enzymes: No results for input(s): CKTOTAL, CKMB, CKMBINDEX, TROPONINI in the last 168 hours.  BNP: Invalid input(s): POCBNP  CBG: Recent Labs  Lab 06/16/21 0725 06/16/21 1110 06/16/21 1619 06/16/21 2030 06/17/21 0756  GLUCAP 117* 168* 129* 138* 128*     Microbiology: Results for orders placed or performed during the hospital encounter of 06/12/21  Resp Panel by RT-PCR (Flu A&B, Covid) Nasopharyngeal Swab     Status: None   Collection Time: 06/12/21  6:35 PM   Specimen: Nasopharyngeal Swab; Nasopharyngeal(NP) swabs in vial transport medium  Result Value Ref Range Status   SARS Coronavirus 2 by RT PCR NEGATIVE NEGATIVE Final    Comment: (NOTE) SARS-CoV-2 target nucleic acids are NOT DETECTED.  The SARS-CoV-2 RNA is generally detectable in upper respiratory specimens during the acute phase of infection. The lowest concentration of SARS-CoV-2 viral copies this assay can detect is  138 copies/mL. A negative result does not preclude SARS-Cov-2 infection and should not be used as the sole basis for treatment or other patient management decisions. A negative result may occur with  improper specimen collection/handling, submission of specimen other than nasopharyngeal swab, presence of viral mutation(s) within the areas targeted by this assay, and inadequate number of viral copies(<138 copies/mL). A negative result must be combined with clinical observations, patient history, and epidemiological information. The expected result is Negative.  Fact Sheet for Patients:  EntrepreneurPulse.com.au  Fact Sheet for Healthcare Providers:  IncredibleEmployment.be  This test is no t yet  approved or cleared by the [place] FDA and  has been authorized for detection and/or diagnosis of SARS-CoV-2 by FDA under an Emergency Use Authorization (EUA). This EUA will remain  in effect (meaning this test can be used) for the duration of the COVID-19 declaration under Section 564(b)(1) of the Act, 21 U.S.C.section 360bbb-3(b)(1), unless the authorization is terminated  or revoked sooner.       Influenza A by PCR NEGATIVE NEGATIVE Final   Influenza B by PCR NEGATIVE NEGATIVE Final    Comment: (NOTE) The Xpert Xpress SARS-CoV-2/FLU/RSV plus assay is intended as an aid in the diagnosis of influenza from Nasopharyngeal swab specimens and should not be used as a sole basis for treatment. Nasal washings and aspirates are unacceptable for Xpert Xpress SARS-CoV-2/FLU/RSV testing.  Fact Sheet for Patients: EntrepreneurPulse.com.au  Fact Sheet for Healthcare Providers: IncredibleEmployment.be  This test is not yet approved or cleared by the [place] FDA and has been authorized for detection and/or diagnosis of SARS-CoV-2 by FDA under an Emergency Use Authorization (EUA). This EUA will remain in effect (meaning this test can be used) for the duration of the COVID-19 declaration under Section 564(b)(1) of the Act, 21 U.S.C. section 360bbb-3(b)(1), unless the authorization is terminated or revoked.  Performed at [facility], [address] [place], [place] [address]   Blood culture (routine x 2)     Status: None   Collection Time: 06/12/21  7:48 PM   Specimen: BLOOD  Result Value Ref Range Status   Specimen Description BLOOD RAC  Final   Special Requests   Final    BOTTLES DRAWN AEROBIC AND ANAEROBIC Blood Culture results may not be optimal due to an inadequate volume of blood received in culture bottles   Culture   Final    NO GROWTH 5 DAYS Performed at [facility], [address] [place], [place] [address]     Report Status 06/17/2021 FINAL  Final  Blood culture (routine x 2)     Status: None   Collection Time: 06/12/21  7:48 PM   Specimen: BLOOD  Result Value Ref Range Status   Specimen Description BLOOD BLOOD LEFT HAND  Final   Special Requests   Final    BOTTLES DRAWN AEROBIC AND ANAEROBIC Blood Culture results may not be optimal due to an inadequate volume of blood received in culture bottles   Culture   Final    NO GROWTH 5 DAYS Performed at [facility], [address] [place], [place] [address]    Report Status 06/17/2021 FINAL  Final    Coagulation Studies: No results for input(s): LABPROT, INR in the last 72 hours.  Urinalysis: No results for input(s): COLORURINE, LABSPEC, PHURINE, GLUCOSEU, HGBUR, BILIRUBINUR, KETONESUR, PROTEINUR, UROBILINOGEN, NITRITE, LEUKOCYTESUR in the last 72 hours.  Invalid input(s): APPERANCEUR    Imaging: US RENAL  Result Date: 06/15/2021 CLINICAL DATA:  AKI EXAM: RENAL / URINARY TRACT ULTRASOUND COMPLETE COMPARISON:  None. FINDINGS: Right Kidney: Renal measurements: 11.4 x 5 x 5.2 cm = volume: 156 mL. Echogenicity is increased. Cysts are present at the lower pole. No hydronephrosis visualized. Left Kidney: Renal measurements: 11.9 x 6.3 x 5.4 cm = volume: 213 mL. Echogenicity is increased. Cysts are present at the upper pole. No hydronephrosis visualized. Bladder: Appears normal for degree of bladder distention. Other: None. IMPRESSION: No hydronephrosis. Increased renal echogenicity suggesting medical renal disease. Electronically Signed   By: Macy Mis M.D.   On: 06/15/2021 15:10     Medications:     apixaban  5 mg Oral BID   aspirin EC  81 mg Oral Daily   bisacodyl  5 mg Oral Once   colchicine  0.3 mg Oral Daily   diltiazem  300 mg Oral Daily   docusate sodium  100 mg Oral BID   hydrALAZINE  10 mg Oral Q8H   insulin aspart  0-5 Units Subcutaneous QHS   insulin aspart  0-9 Units Subcutaneous TID WC   methylPREDNISolone  (SOLU-MEDROL) injection  40 mg Intravenous Q12H   mometasone-formoterol  2 puff Inhalation BID   multivitamin with minerals  1 tablet Oral Daily   polyethylene glycol  17 g Oral Once   sodium chloride flush  3 mL Intravenous Q12H   sodium zirconium cyclosilicate  10 g Oral BID   tamsulosin  0.4 mg Oral QPC supper   acetaminophen **OR** acetaminophen, albuterol, bisacodyl, hydrALAZINE, ondansetron (ZOFRAN) IV, oxyCODONE, polyethylene glycol  Assessment/ Plan:  [name] is a 70 y.o.  male with medical problems of hypertension, atrial fibrillation, chronic edema, LVH,   was admitted on 06/12/2021 for :SOB (shortness of breath) [R06.02] Bilateral leg edema [R60.0] Acute respiratory failure with hypoxia ([place]) [J96.01] Atrial fibrillation with RVR ([place]) [I48.91]   Acute kidney injury on chronic kidney disease stage IIIb Baseline creatinine 1.81/GFR 40 from December 04, 2020 The cause for AKI is not entirely clear but possibly related to hemodynamic instability from A. fib and RVR. Urinalysis at admission appears benign Renal imaging negative for obstruction. Creatinine improved.    Agree with holding lisinopril and meloxicam. Avoid nonsteroidals, IV contrast Avoid hypotension Will require discharge follow up with our office.    2. Chronic lower extremity edema No diuretics at home   3. Hyperkalemia/hyponatremia Likely from AKI. Lokelma given yesterday, potassium 6.1.  Sodium improved 134, will monitor Lokelma 10g x 2 for potassium   4. Hesitancy of urination Tamsulosin added yesterday    LOS: 5   11/4/202210:30 AM

## 2021-06-17 NOTE — Plan of Care (Signed)
Pt reported pain in chest 4/10, first said started an hour ago then said off and on all day. Pt answering questions appropriately but confused, oriented to self only.  Pt wheezing, denies SOB, RT contacted for nebulizer: no improvement in pain after nebulizer. Provider notified and EKG obtained. Tylenol given and no improvement in pain then pt reports pain is also in upper abdomen. Provider notified and GI cocktail given per order; pain in chest and abdomen both resolved after GI cocktail.    Problem: Health Behavior/Discharge Planning: Goal: Ability to manage health-related needs will improve Outcome: Progressing   Problem: Clinical Measurements: Goal: Ability to maintain clinical measurements within normal limits will improve Outcome: Progressing Goal: Diagnostic test results will improve Outcome: Progressing

## 2021-06-17 NOTE — Progress Notes (Addendum)
Patient did not have a bowel movement after administration of 2 stool softeners and a laxative.  Passed on to night nurse- may need something stronger.   Per patient- does feel some discomfort in abdomen.

## 2021-06-17 NOTE — Progress Notes (Signed)
   Name Matthew [name]  DOB 07/09.1952 Date 06/17/2021  Subjective: Nurse reports patient with acute worseining shortness of breath. No relief post breathing treatment.  Also heart rate in 130's  Bedside patient conquers breathing has gotten worse gradually throughout the day. Describes it about as bad of a spell that he has had in a long time.  Additionally he reports poor oral intalke and decreased mobility of late  Review of chart and vitals shows patient with new supplemental oxygen need starting this am around 0800 Nurse also reports no BM since admission  Objective: Vitals:   06/17/21 1800 06/17/21 1950  BP: (!) 143/74 140/84  Pulse: (!) 112 79  Resp: 18 18  Temp: 98 F (36.7 C) 97.7 F (36.5 C)  SpO2: 90% 100%      Assessment:  General: mild distress Neuro: A & O to person and place. Follows commands: anxious [facility]: general weakness. MAE equally, calf tenderness to palpation on left, edema of feet bilateral Resp: increased work of breathing and shallow; supplemental nasal cannula oxygen at 2 L CV:  BP stable, atrial flutter - RVR 100-110 Abdomen" rounded but soft,reports increase pressure   LABS Procal: 5.29 CBC"Bramble count up to 15.5 from 11.8 neutrophil predominance  BNP: improved from admission, down to 181.2 Ddimer: 0.59 ABG:  7.32, 42.112,.21.6 on 2 L [place] oxygen  Radiograph Chest xray with mild cardiomegaly and no acute cardiopulmonary process, however, compared to prior on 10/30, RLL more hazy predominence and LLL difficult to see clearly    Plan:  Acute Respiratory failure with hypoxia Supplemental oxygen keep sats above 92 Bipap if needed for work of breathing Treat for pneumonia - azithromycin and rocephin IS, flutter valve Continue steroid wean  Pneumonia Likely with sxs and elevated WBC (acute increase no increase in steroid dose), elevated procal Multifactorial, weakness(severe B12 deficiency) decreased mobility and poor oral intake Check urine  strept and legionella Rocephin and azithromycin Trend procal  Constipation  Decreased intake, low b12, decreased mobility Also concerning prior abdominal surgery and possible adhesions as he has had chronic abdominal pain Last CT 07/2020 IMPRESSION: 1. Mild thoracoabdominal aortic atherosclerosis without dissection or acute aortic findings. 2. Intraluminal debris distends the mid and distal esophagus, can be seen with reflux or esophageal dysmotility. No definite esophageal wall thickening. 3. Small pulmonary nodules demonstrate stability for 21 months and are most certainly benign. 4. Nonobstructing left renal stones. 5. Colonic diverticulosis without diverticulitis. 6. A 9 mm nodule abutting the transverse colon may represent a diverticulum versus pericolonic lymph node. 7. Additional chronic findings as described.  Further follow up with CT should be done inpatient if no relief from constipation.

## 2021-06-17 NOTE — Progress Notes (Signed)
Mobility Specialist - Progress Note   06/17/21 1200  Mobility  Activity Contraindicated/medical hold  Mobility performed by Mobility specialist    Per chart review, pt with elevated K+ of 6.1 this date, contraindicating mobility. Will attempt session another date/time as medically appropriate.    Kathee Delton Mobility Specialist 06/17/21, 12:36 PM

## 2021-06-17 NOTE — Progress Notes (Signed)
Patient HR jumped up to 140-150s with activity with PT.  Patient has been sustaining in A.fib 110s-120s.   MD made aware- patient is on metoprolol, will continue to monitor

## 2021-06-17 NOTE — Progress Notes (Signed)
PROGRESS NOTE    Matthew [name]  HEN:[id number] DOB: [birth year] DOA: 06/12/2021 PCP: [name], MD    Brief Narrative:  This 70 years old male with PMH significant for hypertension, asthma, A. fib on Eliquis, CKD stage IIIb, vitamin B12 deficiency, morbid obesity, presented in the ED with acute shortness of breath.  Patient was initially started on steroids and IV Lasix.  He was also found to have A. fib with RVR. Patient is mainly admitted for asthma exacerbation and has A. fib with RVR.  Serum creatinine has worsened requiring nephrology consult.  Assessment & Plan:   Principal Problem:   SOB (shortness of breath) Active Problems:   Moderate persistent asthma with exacerbation   Essential hypertension   COPD with acute exacerbation (HCC)   Edema of both legs   Atrial fibrillation with RVR (HCC)   Acute kidney injury superimposed on chronic kidney disease (HCC)   Elevated glucose level   Obesity (BMI 30-39.9)   Macrocytosis   Acute respiratory failure with hypoxia (HCC)   Acute gout of right ankle  Asthma exacerbation: > Improving Presented with acute shortness of breath,  found to have wheezing. Continue Solu-Medrol, DuoNeb and Dulera inhaler. Less likely CHF exacerbation, BNP 279, No pedal edema Echo: LVEF 50-55% No RWMA.  Atrial fibrillation with RVR:  Heart rate is now controlled.   Continue Cardizem 300 mg daily Continue Eliquis for anticoagulation.  Acute kidney injury on CKD stage IIIb: Baseline serum creatinine 1.5- 1.8 With diuresis  serum creatinine went up to 3.71. Avoid Nephrotoxic medications, Hold Lasix Nephrology consulted, recommended renal ultrasound. Renal ultrasound no hydronephrosis, medical renal disease. Serum creatinine improving 3.21.  Hyperkalemia:  Could be related to AKI on CKD. Continue Lokelma 10 mg twice daily for 2 days. Continue to monitor.  Right foot pain: Likely gout with high uric acid. Continue Solu-Medrol, given  colchicine and continue  low-dose on daily basis.  Vitamin B12 deficiency:  Level 149.  Continue B12 injections IM for 3 days then supplement orally.  Essential hypertension: Continue Cardizem and hydralazine.  Morbid obesity BMI 38.50: Counseled on weight loss.  Recommend outpatient sleep studies.  Hyperglycemia: This could be secondary to steroids.  Hemoglobin A1c 5.9 Start regular insulin sliding scale.  Leukocytosis: Could be secondary to steroids.   DVT prophylaxis: Eliquis Code Status: Full code. Family Communication:No family at bed side. Disposition Plan:    Status is: Inpatient  Remains inpatient appropriate because: Asthma exacerbation.  AKI on CKD stage III.  Anticipated discharge home in few days.   Consultants:   Nephrology  Procedures: None Antimicrobials:  Anti-infectives (From admission, onward)    None        Subjective: Patient was seen and examined at bedside.  Overnight events noted.   He is down to 2 L of supplemental oxygen.  He reports feeling better. He denies any pain.  He reports breathing has improved.  Objective: Vitals:   06/16/21 2329 06/17/21 0306 06/17/21 0755 06/17/21 1126  BP: 107/87 125/83 131/87 132/83  Pulse: 85 83 86 (!) 109  Resp: 18 18 17 18   Temp: 97.6 F (36.4 C) 97.8 F (36.6 C) 98.2 F (36.8 C) 97.8 F (36.6 C)  TempSrc: Oral Oral    SpO2: 92% 98% (!) 55% 95%  Weight: 128.5 kg     Height:        Intake/Output Summary (Last 24 hours) at 06/17/2021 1159 Last data filed at 06/17/2021 0115 Gross per 24 hour  Intake 720 ml  Output 925 ml  Net -205 ml   Filed Weights   06/15/21 0113 06/16/21 0400 06/16/21 2329  Weight: 131.4 kg 129.8 kg 128.5 kg    Examination:  General exam: Appears comfortable, not in any acute distress. Deconditioned. Respiratory system: Clear to auscultation. Respiratory effort normal.  RR 14 Cardiovascular system: S1-S2 heard, regular rate and rhythm, no murmur.    Gastrointestinal system: Abdomen is soft, nontender, nondistended, BS +. Central nervous system: Alert and oriented x 3. No focal neurological deficits. Extremities: No edema, no cyanosis, no clubbing. Skin: No rashes, lesions or ulcers Psychiatry: Judgement and insight appear normal. Mood & affect appropriate.     Data Reviewed: I have personally reviewed following labs and imaging studies  CBC: Recent Labs  Lab 06/12/21 1834 06/13/21 0319 06/14/21 0436 06/16/21 0428 06/17/21 0924  WBC 18.4* 11.3* 14.1* 15.6* 11.8*  NEUTROABS 15.2*  --   --   --   --   HGB 13.7 12.0* 11.8* 11.6* 12.1*  HCT 41.6 38.1* 38.0* 37.3* 38.3*  MCV 104.5* 105.0* 104.1* 104.8* 105.2*  PLT 482* 362 444* 390 100   Basic Metabolic Panel: Recent Labs  Lab 06/13/21 0319 06/14/21 0436 06/15/21 0509 06/15/21 0842 06/16/21 0428 06/17/21 0924  NA 135 133* 133*  --  132* 134*  K 4.7 5.1 5.2* 5.7* 5.0 6.1*  CL 104 103 102  --  102 103  CO2 22 21* 22  --  22 22  GLUCOSE 258* 194* 131*  --  119* 148*  BUN 53* 67* 90*  --  97* 97*  CREATININE 2.80* 3.18* 3.71*  --  3.58* 3.12*  CALCIUM 8.8* 9.2 8.8*  --  8.5* 8.6*  MG  --   --  2.4  --  2.3 2.8*  PHOS  --   --  4.8*  --  4.8* 5.2*   GFR: Estimated Creatinine Clearance: 30.9 mL/min (A) (by C-G formula based on SCr of 3.12 mg/dL (H)). Liver Function Tests: Recent Labs  Lab 06/12/21 1834  AST 25  ALT 17  ALKPHOS 92  BILITOT 1.3*  PROT 7.8  ALBUMIN 2.4*   No results for input(s): LIPASE, AMYLASE in the last 168 hours. No results for input(s): AMMONIA in the last 168 hours. Coagulation Profile: No results for input(s): INR, PROTIME in the last 168 hours. Cardiac Enzymes: No results for input(s): CKTOTAL, CKMB, CKMBINDEX, TROPONINI in the last 168 hours. BNP (last 3 results) No results for input(s): PROBNP in the last 8760 hours. HbA1C: No results for input(s): HGBA1C in the last 72 hours.  CBG: Recent Labs  Lab 06/16/21 1110  06/16/21 1619 06/16/21 2030 06/17/21 0756 06/17/21 1128  GLUCAP 168* 129* 138* 128* 175*   Lipid Profile: No results for input(s): CHOL, HDL, LDLCALC, TRIG, CHOLHDL, LDLDIRECT in the last 72 hours.  Thyroid Function Tests: No results for input(s): TSH, T4TOTAL, FREET4, T3FREE, THYROIDAB in the last 72 hours.  Anemia Panel: No results for input(s): VITAMINB12, FOLATE, FERRITIN, TIBC, IRON, RETICCTPCT in the last 72 hours.  Sepsis Labs: Recent Labs  Lab 06/12/21 1948 06/13/21 0319  LATICACIDVEN 1.6 1.9    Recent Results (from the past 240 hour(s))  Resp Panel by RT-PCR (Flu A&B, Covid) Nasopharyngeal Swab     Status: None   Collection Time: 06/12/21  6:35 PM   Specimen: Nasopharyngeal Swab; Nasopharyngeal(NP) swabs in vial transport medium  Result Value Ref Range Status   SARS Coronavirus 2 by RT PCR NEGATIVE NEGATIVE Final    Comment: (  NOTE) SARS-CoV-2 target nucleic acids are NOT DETECTED.  The SARS-CoV-2 RNA is generally detectable in upper respiratory specimens during the acute phase of infection. The lowest concentration of SARS-CoV-2 viral copies this assay can detect is 138 copies/mL. A negative result does not preclude SARS-Cov-2 infection and should not be used as the sole basis for treatment or other patient management decisions. A negative result may occur with  improper specimen collection/handling, submission of specimen other than nasopharyngeal swab, presence of viral mutation(s) within the areas targeted by this assay, and inadequate number of viral copies(<138 copies/mL). A negative result must be combined with clinical observations, patient history, and epidemiological information. The expected result is Negative.  Fact Sheet for Patients:  EntrepreneurPulse.com.au  Fact Sheet for Healthcare Providers:  IncredibleEmployment.be  This test is no t yet approved or cleared by the [place] FDA and  has been  authorized for detection and/or diagnosis of SARS-CoV-2 by FDA under an Emergency Use Authorization (EUA). This EUA will remain  in effect (meaning this test can be used) for the duration of the COVID-19 declaration under Section 564(b)(1) of the Act, 21 U.S.C.section 360bbb-3(b)(1), unless the authorization is terminated  or revoked sooner.       Influenza A by PCR NEGATIVE NEGATIVE Final   Influenza B by PCR NEGATIVE NEGATIVE Final    Comment: (NOTE) The Xpert Xpress SARS-CoV-2/FLU/RSV plus assay is intended as an aid in the diagnosis of influenza from Nasopharyngeal swab specimens and should not be used as a sole basis for treatment. Nasal washings and aspirates are unacceptable for Xpert Xpress SARS-CoV-2/FLU/RSV testing.  Fact Sheet for Patients: EntrepreneurPulse.com.au  Fact Sheet for Healthcare Providers: IncredibleEmployment.be  This test is not yet approved or cleared by the [place] FDA and has been authorized for detection and/or diagnosis of SARS-CoV-2 by FDA under an Emergency Use Authorization (EUA). This EUA will remain in effect (meaning this test can be used) for the duration of the COVID-19 declaration under Section 564(b)(1) of the Act, 21 U.S.C. section 360bbb-3(b)(1), unless the authorization is terminated or revoked.  Performed at [facility], [address] [place], [place] [address]   Blood culture (routine x 2)     Status: None   Collection Time: 06/12/21  7:48 PM   Specimen: BLOOD  Result Value Ref Range Status   Specimen Description BLOOD RAC  Final   Special Requests   Final    BOTTLES DRAWN AEROBIC AND ANAEROBIC Blood Culture results may not be optimal due to an inadequate volume of blood received in culture bottles   Culture   Final    NO GROWTH 5 DAYS Performed at [facility], [address] [place], [place] [address]    Report Status 06/17/2021 FINAL  Final  Blood culture  (routine x 2)     Status: None   Collection Time: 06/12/21  7:48 PM   Specimen: BLOOD  Result Value Ref Range Status   Specimen Description BLOOD BLOOD LEFT HAND  Final   Special Requests   Final    BOTTLES DRAWN AEROBIC AND ANAEROBIC Blood Culture results may not be optimal due to an inadequate volume of blood received in culture bottles   Culture   Final    NO GROWTH 5 DAYS Performed at [facility], [address] [place], [place] [address]    Report Status 06/17/2021 FINAL  Final    Radiology Studies: US RENAL  Result Date: 06/15/2021 CLINICAL DATA:  AKI EXAM: RENAL /  URINARY TRACT ULTRASOUND COMPLETE COMPARISON:  None. FINDINGS: Right Kidney: Renal measurements: 11.4 x 5 x 5.2 cm = volume: 156 mL. Echogenicity is increased. Cysts are present at the lower pole. No hydronephrosis visualized. Left Kidney: Renal measurements: 11.9 x 6.3 x 5.4 cm = volume: 213 mL. Echogenicity is increased. Cysts are present at the upper pole. No hydronephrosis visualized. Bladder: Appears normal for degree of bladder distention. Other: None. IMPRESSION: No hydronephrosis. Increased renal echogenicity suggesting medical renal disease. Electronically Signed   By: Macy Mis M.D.   On: 06/15/2021 15:10    Scheduled Meds:  apixaban  5 mg Oral BID   aspirin EC  81 mg Oral Daily   colchicine  0.3 mg Oral Daily   diltiazem  300 mg Oral Daily   docusate sodium  100 mg Oral BID   hydrALAZINE  10 mg Oral Q8H   insulin aspart  0-5 Units Subcutaneous QHS   insulin aspart  0-9 Units Subcutaneous TID WC   [START ON 06/18/2021] methylPREDNISolone (SOLU-MEDROL) injection  40 mg Intravenous Daily   mometasone-formoterol  2 puff Inhalation BID   multivitamin with minerals  1 tablet Oral Daily   sodium chloride flush  3 mL Intravenous Q12H   sodium zirconium cyclosilicate  10 g Oral BID   tamsulosin  0.4 mg Oral QPC supper   Continuous Infusions:   LOS: 5 days    Time spent: 25 Mins    Deena Shaub, MD [facility]   If 7PM-7AM, please contact night-coverage

## 2021-06-17 NOTE — Progress Notes (Signed)
Occupational Therapy Treatment Patient Details Name: Matthew [name] MRN: [id number] DOB: 1951-01-26 Today's Date: 06/17/2021   History of present illness Pt admitted for complaints of cough and SOB symptoms. HIstory includes HTN, asthma, COPD, and obesity.   OT comments  Mr Cross was seen for OT treatment on this date. Upon arrival to room pt reclined in bed, agreeable to tx. CGA sit>stand and initiating grooming standing sinkside however pt unable to tolerate standing ~1 min, requires BUE support and ultimately returns to sittting with MIN A for safety/balance.  SpO2 97% on 2L [name] at rest, desat 83% in standing, resolved with seated rest break. Second standing trial pt attempted side steps, HR 154 bpm in standing, resolved to 100 bpm with return to supine. Pt making progress toward goals. Pt continues to benefit from skilled OT services to maximize return to PLOF and minimize risk of future falls, injury, caregiver burden, and readmission. Will continue to follow POC. Discharge recommendation remains appropriate.     Recommendations for follow up therapy are one component of a multi-disciplinary discharge planning process, led by the attending physician.  Recommendations may be updated based on patient status, additional functional criteria and insurance authorization.    Follow Up Recommendations  Skilled nursing-short term rehab (<3 hours/day)    Assistance Recommended at Discharge Intermittent Supervision/Assistance  Equipment Recommendations  [name]    Recommendations for Other Services      Precautions / Restrictions Precautions Precautions: Fall Restrictions Weight Bearing Restrictions: No Other Position/Activity Restrictions: watch HR       Mobility Bed Mobility Overal bed mobility: Needs Assistance Bed Mobility: Supine to Sit;Sit to Supine     Supine to sit: Min guard;HOB elevated Sit to supine: Min guard;HOB elevated   General bed mobility comments: rail use and  signifcantly increased time for BLE    Transfers Overall transfer level: Needs assistance Equipment used: 1 person hand held assist Transfers: Sit to/from Stand Sit to Stand: Min assist           General transfer comment: CGA to rise, MIN A to return to sitting for eccentric control     Balance Overall balance assessment: History of Falls;Needs assistance Sitting-balance support: Bilateral upper extremity supported;Feet supported Sitting balance-Leahy Scale: Good     Standing balance support: Bilateral upper extremity supported;During functional activity Standing balance-Leahy Scale: Fair                             ADL either performed or assessed with clinical judgement   ADL Overall ADL's : Needs assistance/impaired                                       General ADL Comments: CGA sit>stand and initiating grooming standing sinkside however pt unable to toelrate standing ~1 min, requires BUE support and ultimately returns to sittting with MIN A for safety/balance.      Cognition Arousal/Alertness: Awake/alert Behavior During Therapy: WFL for tasks assessed/performed Overall Cognitive Status: Within Functional Limits for tasks assessed                                            Exercises Exercises: Other exercises;General Lower Extremity General Exercises - Lower Extremity Ankle Circles/Pumps: AROM;Strengthening;Both;10 reps;Seated Long Arc Quad:  AROM;Strengthening;Both;10 reps;Seated Hip Flexion/Marching: AROM;Strengthening;Both;10 reps;Seated Other Exercises Other Exercises: Pt educated re: HEP, falls prevention, ECS Other Exercises: sup<>sit, sit<>stand x2, sittting/standing balance/tolerance   Shoulder Instructions       General Comments SpO2 97% on 2L [name] at rest, desat 83% in standing, resolved with seated rest break. HR 154 bpm in standing, resolved to 100 bpm with return to supine.     Pertinent Vitals/  Pain       Pain Assessment: No/denies pain         Frequency  Min 2X/week        Progress Toward Goals  OT Goals(current goals can now be found in the care plan section)  Progress towards OT goals: Progressing toward goals  Acute Rehab OT Goals Patient Stated Goal: to feel better OT Goal Formulation: With patient Time For Goal Achievement: 06/27/21 Potential to Achieve Goals: Good ADL Goals Pt Will Perform Grooming: with modified independence;standing Pt Will Perform Lower Body Dressing: sit to/from stand;Independently Pt Will Transfer to Toilet: ambulating;regular height toilet;with modified independence  Plan Discharge plan remains appropriate;Frequency needs to be updated       AM-PAC OT "6 Clicks" Daily Activity     Outcome Measure   Help from another person eating meals?: None Help from another person taking care of personal grooming?: A Lot Help from another person toileting, which includes using toliet, bedpan, or urinal?: A Lot Help from another person bathing (including washing, rinsing, drying)?: A Little Help from another person to put on and taking off regular upper body clothing?: A Little Help from another person to put on and taking off regular lower body clothing?: A Little 6 Click Score: 17    End of Session    OT Visit Diagnosis: Other abnormalities of gait and mobility (R26.89);History of falling (Z91.81)   Activity Tolerance Patient tolerated treatment well;Patient limited by fatigue   Patient Left in bed;with call bell/phone within reach;with bed alarm set   Nurse Communication Other (comment) (HR 154)        Time: 1001-1015 OT Time Calculation (min): 14 min  Charges: OT General Charges $OT Visit: 1 Visit OT Treatments $Self Care/Home Management : 8-22 mins  Dessie Coma, M.S. OTR/L  06/17/21, 10:51 AM  ascom [phone]

## 2021-06-17 NOTE — Progress Notes (Signed)
   06/17/21 1400  Assess: MEWS Score  Temp 97.8 F (36.6 C) (entered incorrectly at first)  BP 138/85  Pulse Rate (!) 118  ECG Heart Rate (!) 111  Resp 20  Level of Consciousness Alert  SpO2 98 %  O2 Device Nasal Cannula  O2 Flow Rate (L/min) 2 L/min  Assess: MEWS Score  MEWS Temp 0  MEWS Systolic 0  MEWS Pulse 2  MEWS RR 0  MEWS LOC 0  MEWS Score 2  MEWS Score Color Yellow  Assess: if the MEWS score is Yellow or Red  Were vital signs taken at a resting state? Yes  Focused Assessment Change from prior assessment (see assessment flowsheet)  Does the patient meet 2 or more of the SIRS criteria? Yes  Does the patient have a confirmed or suspected source of infection? No  MEWS guidelines implemented *See Row Information* Yes  Treat  MEWS Interventions Administered scheduled meds/treatments (MD ordered 1 dose of 20mg  IV lasix)  Pain Scale 0-10  Pain Score 0  Take Vital Signs  Increase Vital Sign Frequency  Yellow: Q 2hr X 2 then Q 4hr X 2, if remains yellow, continue Q 4hrs  Escalate  MEWS: Escalate Yellow: discuss with charge nurse/RN and consider discussing with provider and RRT  Notify: Charge Nurse/RN  Name of Charge Nurse/RN Notified Jessica RN  Date Charge Nurse/RN Notified 06/17/21  Time Charge Nurse/RN Notified 1600  Notify: Provider  Provider Name/Title Kumar MD  Date Provider Notified 06/17/21  Time Provider Notified 1400  Notification Type Page (secure chat)  Notification Reason Change in status (increase SOB)  Provider response See new orders  Date of Provider Response 06/17/21  Time of Provider Response 1415  Document  Patient Outcome Stabilized after interventions  Progress note created (see row info) Yes  Assess: SIRS CRITERIA  SIRS Temperature  0  SIRS Pulse 1  SIRS Respirations  0  SIRS WBC 0  SIRS Score Sum  1   Patient complaining of worsening SOB, feeling hot and increased anxiety.    During assessment, V.S.- HR elevated at 118, otherwise  WNL; O2 sats at 98% on 2 L and respiratory rate at 20.  Noted some crackles bilateral lower lobes.   Paged MD.   MD gave verbal order to give 1 time dose of 20 mg of lasix.   Also, replaced battery in fan for patient.

## 2021-06-18 LAB — BASIC METABOLIC PANEL
Anion gap: 12 (ref 5–15)
Anion gap: 9 (ref 5–15)
BUN: 87 mg/dL — ABNORMAL HIGH (ref 8–23)
BUN: 104 mg/dL — ABNORMAL HIGH (ref 8–23)
CO2: 21 mmol/L — ABNORMAL LOW (ref 22–32)
CO2: 22 mmol/L (ref 22–32)
Calcium: 8.5 mg/dL — ABNORMAL LOW (ref 8.9–10.3)
Calcium: 8.3 mg/dL — ABNORMAL LOW (ref 8.9–10.3)
Chloride: 100 mmol/L (ref 98–111)
Chloride: 103 mmol/L (ref 98–111)
Creatinine, Ser: 3.12 mg/dL — ABNORMAL HIGH (ref 0.61–1.24)
Creatinine, Ser: 2.91 mg/dL — ABNORMAL HIGH (ref 0.61–1.24)
GFR, Estimated: 21 mL/min — ABNORMAL LOW (ref >60)
GFR, Estimated: 22 mL/min — ABNORMAL LOW (ref >60)
Glucose, Bld: 159 mg/dL — ABNORMAL HIGH (ref 70–99)
Glucose, Bld: 111 mg/dL — ABNORMAL HIGH (ref 70–99)
Potassium: 5.4 mmol/L — ABNORMAL HIGH (ref 3.5–5.1)
Potassium: 5.8 mmol/L — ABNORMAL HIGH (ref 3.5–5.1)
Sodium: 133 mmol/L — ABNORMAL LOW (ref 135–145)
Sodium: 134 mmol/L — ABNORMAL LOW (ref 135–145)

## 2021-06-18 LAB — CBC
HCT: 35.9 % — ABNORMAL LOW (ref 39.0–52.0)
Hemoglobin: 11.1 g/dL — ABNORMAL LOW (ref 13.0–17.0)
MCH: 32.1 pg (ref 26.0–34.0)
MCHC: 30.9 g/dL (ref 30.0–36.0)
MCV: 103.8 fL — ABNORMAL HIGH (ref 80.0–100.0)
Platelets: 295 10*3/uL (ref 150–400)
RBC: 3.46 MIL/uL — ABNORMAL LOW (ref 4.22–5.81)
RDW: 13.2 % (ref 11.5–15.5)
WBC: 14.2 10*3/uL — ABNORMAL HIGH (ref 4.0–10.5)
nRBC: 0 % (ref 0.0–0.2)

## 2021-06-18 LAB — STREP PNEUMONIAE URINARY ANTIGEN: Strep Pneumo Urinary Antigen: NEGATIVE

## 2021-06-18 LAB — MAGNESIUM: Magnesium: 2.4 mg/dL (ref 1.7–2.4)

## 2021-06-18 LAB — GLUCOSE, CAPILLARY
Glucose-Capillary: 161 mg/dL — ABNORMAL HIGH (ref 70–99)
Glucose-Capillary: 190 mg/dL — ABNORMAL HIGH (ref 70–99)
Glucose-Capillary: 106 mg/dL — ABNORMAL HIGH (ref 70–99)
Glucose-Capillary: 173 mg/dL — ABNORMAL HIGH (ref 70–99)

## 2021-06-18 LAB — PROCALCITONIN: Procalcitonin: 5.28 ng/mL

## 2021-06-18 LAB — PHOSPHORUS: Phosphorus: 4.4 mg/dL (ref 2.5–4.6)

## 2021-06-18 MED ORDER — DILTIAZEM HCL 30 MG PO TABS
30.0000 mg | ORAL_TABLET | Freq: Once | ORAL | Status: AC
  Administered 2021-06-18: 30 mg via ORAL
  Filled 2021-06-18: qty 1

## 2021-06-18 MED ORDER — FAMOTIDINE 20 MG PO TABS
20.0000 mg | ORAL_TABLET | Freq: Every day | ORAL | Status: DC
  Administered 2021-06-18 – 2021-06-23 (×6): 20 mg via ORAL
  Filled 2021-06-18 (×6): qty 1

## 2021-06-18 NOTE — Progress Notes (Signed)
[facility]  ROUNDING NOTE   Subjective:   Patient seen today on second floor Patient resting comfortably in the bed. Patient offers no new specific physical complaint. I then discussed with the patient regarding his kidney related issues. I informed the patient that his creatinine has come down from 3.7 to now 2.9.  I educated patient what does this mean Patient was understanding   Objective:  Vital signs in last 24 hours:  Temp:  [97.7 F (36.5 C)-98.6 F (37 C)] 97.8 F (36.6 C) (11/05 1204) Pulse Rate:  [50-118] 101 (11/05 1204) Resp:  [17-20] 17 (11/05 1204) BP: (109-146)/(52-88) 125/74 (11/05 1204) SpO2:  [90 %-100 %] 91 % (11/05 1204) Weight:  [131.5 kg] 131.5 kg (11/05 0500)  Weight change: 2.998 kg Filed Weights   06/16/21 0400 06/16/21 2329 06/18/21 0500  Weight: 129.8 kg 128.5 kg 131.5 kg    Intake/Output: I/O last 3 completed shifts: In: 1060 [P.O.:960; IV Piggyback:100] Out: 3500 [Urine:3500]   Intake/Output this shift:  Total I/O In: 240 [P.O.:240] Out: 750 [Urine:750]  Physical Exam: General: NAD  Head: Normocephalic, atraumatic. Moist oral mucosal membranes  Eyes: Anicteric  Lungs:  Clear to auscultation, normal effort  Heart: Regular rate and rhythm  Abdomen:  Soft, nontender  Extremities:  no peripheral edema.  Neurologic: Nonfocal, moving all four extremities  Skin: No lesions       Basic Metabolic Panel: Recent Labs  Lab 06/15/21 0509 06/15/21 0842 06/16/21 0428 06/17/21 0924 06/17/21 2041 06/18/21 0414  NA 133*  --  132* 134* 133* 134*  K 5.2* 5.7* 5.0 6.1* 5.4* 5.8*  CL 102  --  102 103 100 103  CO2 22  --  22 22 21* 22  GLUCOSE 131*  --  119* 148* 159* 111*  BUN 90*  --  97* 97* 87* 104*  CREATININE 3.71*  --  3.58* 3.12* 3.12* 2.91*  CALCIUM 8.8*  --  8.5* 8.6* 8.5* 8.3*  MG 2.4  --  2.3 2.8*  --  2.4  PHOS 4.8*  --  4.8* 5.2*  --  4.4    Liver Function Tests: Recent Labs  Lab 06/12/21 1834  AST 25   ALT 17  ALKPHOS 92  BILITOT 1.3*  PROT 7.8  ALBUMIN 2.4*   No results for input(s): LIPASE, AMYLASE in the last 168 hours. No results for input(s): AMMONIA in the last 168 hours.  CBC: Recent Labs  Lab 06/12/21 1834 06/13/21 0319 06/14/21 0436 06/16/21 0428 06/17/21 0924 06/17/21 2047 06/18/21 0414  WBC 18.4*   < > 14.1* 15.6* 11.8* 15.5* 14.2*  NEUTROABS 15.2*  --   --   --   --  13.8*  --   HGB 13.7   < > 11.8* 11.6* 12.1* 12.1* 11.1*  HCT 41.6   < > 38.0* 37.3* 38.3* 38.6* 35.9*  MCV 104.5*   < > 104.1* 104.8* 105.2* 104.0* 103.8*  PLT 482*   < > 444* 390 335 354 295   < > = values in this interval not displayed.    Cardiac Enzymes: No results for input(s): CKTOTAL, CKMB, CKMBINDEX, TROPONINI in the last 168 hours.  BNP: Invalid input(s): POCBNP  CBG: Recent Labs  Lab 06/17/21 1634 06/17/21 2100 06/17/21 2342 06/18/21 0731 06/18/21 1206  GLUCAP 104* 195* 97 161* 190*    Microbiology: Results for orders placed or performed during the hospital encounter of 06/12/21  Resp Panel by RT-PCR (Flu A&B, Covid) Nasopharyngeal Swab  Status: None   Collection Time: 06/12/21  6:35 PM   Specimen: Nasopharyngeal Swab; Nasopharyngeal(NP) swabs in vial transport medium  Result Value Ref Range Status   SARS Coronavirus 2 by RT PCR NEGATIVE NEGATIVE Final    Comment: (NOTE) SARS-CoV-2 target nucleic acids are NOT DETECTED.  The SARS-CoV-2 RNA is generally detectable in upper respiratory specimens during the acute phase of infection. The lowest concentration of SARS-CoV-2 viral copies this assay can detect is 138 copies/mL. A negative result does not preclude SARS-Cov-2 infection and should not be used as the sole basis for treatment or other patient management decisions. A negative result may occur with  improper specimen collection/handling, submission of specimen other than nasopharyngeal swab, presence of viral mutation(s) within the areas targeted by this  assay, and inadequate number of viral copies(<138 copies/mL). A negative result must be combined with clinical observations, patient history, and epidemiological information. The expected result is Negative.  Fact Sheet for Patients:  EntrepreneurPulse.com.au  Fact Sheet for Healthcare Providers:  IncredibleEmployment.be  This test is no t yet approved or cleared by the [place] FDA and  has been authorized for detection and/or diagnosis of SARS-CoV-2 by FDA under an Emergency Use Authorization (EUA). This EUA will remain  in effect (meaning this test can be used) for the duration of the COVID-19 declaration under Section 564(b)(1) of the Act, 21 U.S.C.section 360bbb-3(b)(1), unless the authorization is terminated  or revoked sooner.       Influenza A by PCR NEGATIVE NEGATIVE Final   Influenza B by PCR NEGATIVE NEGATIVE Final    Comment: (NOTE) The Xpert Xpress SARS-CoV-2/FLU/RSV plus assay is intended as an aid in the diagnosis of influenza from Nasopharyngeal swab specimens and should not be used as a sole basis for treatment. Nasal washings and aspirates are unacceptable for Xpert Xpress SARS-CoV-2/FLU/RSV testing.  Fact Sheet for Patients: EntrepreneurPulse.com.au  Fact Sheet for Healthcare Providers: IncredibleEmployment.be  This test is not yet approved or cleared by the [place] FDA and has been authorized for detection and/or diagnosis of SARS-CoV-2 by FDA under an Emergency Use Authorization (EUA). This EUA will remain in effect (meaning this test can be used) for the duration of the COVID-19 declaration under Section 564(b)(1) of the Act, 21 U.S.C. section 360bbb-3(b)(1), unless the authorization is terminated or revoked.  Performed at [facility], [address] [place], [place] [address]   Blood culture (routine x 2)     Status: None   Collection Time: 06/12/21  7:48  PM   Specimen: BLOOD  Result Value Ref Range Status   Specimen Description BLOOD RAC  Final   Special Requests   Final    BOTTLES DRAWN AEROBIC AND ANAEROBIC Blood Culture results may not be optimal due to an inadequate volume of blood received in culture bottles   Culture   Final    NO GROWTH 5 DAYS Performed at [facility], [address] [place], [place] [address]    Report Status 06/17/2021 FINAL  Final  Blood culture (routine x 2)     Status: None   Collection Time: 06/12/21  7:48 PM   Specimen: BLOOD  Result Value Ref Range Status   Specimen Description BLOOD BLOOD LEFT HAND  Final   Special Requests   Final    BOTTLES DRAWN AEROBIC AND ANAEROBIC Blood Culture results may not be optimal due to an inadequate volume of blood received in culture bottles   Culture   Final    NO  GROWTH 5 DAYS Performed at [facility], [address] [place], [place] [address]    Report Status 06/17/2021 FINAL  Final    Coagulation Studies: No results for input(s): LABPROT, INR in the last 72 hours.  Urinalysis: No results for input(s): COLORURINE, LABSPEC, PHURINE, GLUCOSEU, HGBUR, BILIRUBINUR, KETONESUR, PROTEINUR, UROBILINOGEN, NITRITE, LEUKOCYTESUR in the last 72 hours.  Invalid input(s): APPERANCEUR    Imaging: DG Chest Port 1 View  Result Date: 06/17/2021 CLINICAL DATA:  Shortness of breath. EXAM: PORTABLE CHEST 1 VIEW COMPARISON:  Chest radiograph dated 06/12/2021. FINDINGS: No focal consolidation, pleural effusion, or pneumothorax. Mild cardiomegaly. No acute osseous pathology. IMPRESSION: No acute cardiopulmonary process. Mild cardiomegaly. Electronically Signed   By: Anner Crete M.D.   On: 06/17/2021 20:42     Medications:    cefTRIAXone (ROCEPHIN)  IV 1 g (06/17/21 2313)    apixaban  5 mg Oral BID   aspirin EC  81 mg Oral Daily   [START ON 06/19/2021] azithromycin  250 mg Oral Daily   colchicine  0.3 mg Oral Daily   diltiazem  300 mg Oral Daily    docusate sodium  100 mg Oral BID   famotidine  20 mg Oral Daily   hydrALAZINE  10 mg Oral Q8H   insulin aspart  0-5 Units Subcutaneous QHS   insulin aspart  0-9 Units Subcutaneous TID WC   melatonin  5 mg Oral QHS   methylPREDNISolone (SOLU-MEDROL) injection  40 mg Intravenous Daily   mometasone-formoterol  2 puff Inhalation BID   multivitamin with minerals  1 tablet Oral Daily   sodium chloride flush  3 mL Intravenous Q12H   sodium zirconium cyclosilicate  10 g Oral BID   tamsulosin  0.4 mg Oral QPC supper   acetaminophen **OR** acetaminophen, albuterol, bisacodyl, hydrALAZINE, ondansetron (ZOFRAN) IV, oxyCODONE, polyethylene glycol  Assessment/ Plan:  [name] is a 70 y.o.  male with medical problems of hypertension, atrial fibrillation, chronic edema, LVH,   was admitted on 06/12/2021 for :SOB (shortness of breath) [R06.02] Bilateral leg edema [R60.0] Acute respiratory failure with hypoxia ([place]) [J96.01] Atrial fibrillation with RVR ([place]) [I48.91]      1)Renal    Acute kidney injury secondary to ATN Patient had AKI secondary to multiple factors Patient was on ACE blocker/NSAIDs Patient had hemodynamic instability as patient had A. 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 Patient had a peak creatinine of 3.7 during this admission Patient creatinine is trending down and is now at 2.9    2)HTN    Blood pressure is stable    3)Anemia of chronic disease  CBC Latest Ref Rng & Units 06/18/2021 06/17/2021 06/17/2021  WBC 4.0 - 10.5 K/uL 14.2(H) 15.5(H) 11.8(H)  Hemoglobin 13.0 - 17.0 g/dL 11.1(L) 12.1(L) 12.1(L)  Hematocrit 39.0 - 52.0 % 35.9(L) 38.6(L) 38.3(L)  Platelets 150 - 400 K/uL 295 354 335       HGb at goal (9--11)   4) Secondary hyperparathyroidism -CKD Mineral-Bone Disorder    Lab Results  Component Value Date   CALCIUM 8.3 (L) 06/18/2021   CAION 1.52 (HH)  05/19/2020   PHOS 4.4 06/18/2021    Phosphorus at goal.   5)Hyperkalemia Patient potassium is now better.    6) Hyponatremia Patient sodium is stable   7)Acid base    Co2 at goal    Plan   We will continue to follow  LOS: 6 Atianna Haidar s Dariah Mcsorley 11/5/20221:19 PM

## 2021-06-18 NOTE — TOC Progression Note (Signed)
Transition of Care [facility]) - Progression Note    Patient Details  Name: Matthew [name] MRN: [id number] Date of Birth: February 13, 1951  Transition of Care [facility]) CM/SW [place], [place] Phone Number: 06/18/2021, 9:17 AM  Clinical Narrative:     CSW lvm with patient's spouse to inquire as to bed choice, as bed offers were previously presented to patient and wife.   Pending call back with bed choice/preference at this time.  Expected Discharge Plan: [place] Barriers to Discharge: Continued Medical Work up  Expected Discharge Plan and Services Expected Discharge Plan: [place] In-house Referral: Clinical Social Work   Post Acute Care Choice: [place] Living arrangements for the past 2 months: Single Family Home                                       Social Determinants of Health (SDOH) Interventions    Readmission Risk Interventions No flowsheet data found.

## 2021-06-18 NOTE — Progress Notes (Signed)
Orders for ABG and Bipap. ABG drawn. pCO2 and pO2 within normal limits. Patient placed on Dreamstation Bipap 10/5 with 1 lpm bleed in per order, for SpO2 of 98%. Tolerating well. Will continue to monitor.

## 2021-06-18 NOTE — Progress Notes (Signed)
CCMD reported HR 120's-130's afib sustained/MD at bedside rounding with pt/made aware/see new orders. Will continue to monitor.

## 2021-06-18 NOTE — Progress Notes (Addendum)
PROGRESS NOTE  Matthew [name]:[id number] DOB: May 05, 1951 DOA: 06/12/2021 PCP: [name], MD  HPI/Recap of past 24 hours: Brief Narrative:   This is a 70 year old male with past medical history significant for hypertension, asthma, atrial fibrillation, he is on Eliquis for this, CKD stage IIIb, vitamin B12 deficiency, morbid obesity, presenting with acute shortness of breath to the emergency department.  Patient was initially started on steroids and IV Lasix he was also found to have A. fib with rapid ventricular rate.  Patient was admitted for asthma exacerbation and atrial fibrillation with rapid ventricular rate his serum creatinine was noted to be worsening and nephrology was consulted  Subjective: June 18, 2021: Patient seen and examined at bedside. Complain that he still has dyspnea on exertion and he does not have home O2.  We will uses BiPAP at night.  He also having tachycardia he was given Cardizem 300 mg earlier this morning but telemetry just called to say that his heart rate is in the 130s in A. fib.  Assessment/Plan: Principal Problem:   SOB (shortness of breath) Active Problems:   Moderate persistent asthma with exacerbation   Essential hypertension   COPD with acute exacerbation (HCC)   Edema of both legs   Atrial fibrillation with RVR (HCC)   Acute kidney injury superimposed on chronic kidney disease (HCC)   Elevated glucose level   Obesity (BMI 30-39.9)   Macrocytosis   Acute respiratory failure with hypoxia (HCC)   Acute gout of right ankle   Asthma exacerbation: > Improving  Continue Solu-Medrol DuoNeb and Dulera inhaler His BNP was 279 but no edema and EF was 50-55 by echo.   Atrial fibrillation with RVR:       Heart rate is still elevated with RVR He was given a extra dose of Cardizem IR 30 mg for better rate control Continue Cardizem ER 300 mg Continue Eliquis for anticoagulation   Acute kidney injury on CKD stage IIIb: His creatinine  worsened slightly with Lasix but that is on hold now. Nephrology consulted they recommended renal ultrasound, Which was done and showed no hydronephrosis but medical renal disease. Continue to monitor creatinine for improvement avoid nephrotoxic drugs   Hyperkalemia:  Likely related to AKI on CKD Slightly improved Continue leukoma 10 mg twice daily for 2 days Monitor electrolytes.  We will recheck chemistry in the morning   Right foot pain: likely due to gout his uric acid is elevated Will continue Solu-Medrol and colchicine   Essential hypertension: Continue Cardizem and hydralazine   Morbid obesity BMI 38.50: Patient will benefit with weight loss program Recommend outpatient sleep study   Hyperglycemia: Recent hemoglobin A1c is 5.9 Continue sliding scale   Leukocytosis: Could be secondary to steroid Is trending down  Code Status: Full  Severity of Illness:   Patient is inpatient stay due to continued mild medication management unstable for discharge  Family Communication: None at bedside  Disposition Plan:   Status is: Inpatient   Dispo: The patient is from: Home              Anticipated d/c is to:               Anticipated d/c date is:               Patient currently not medically stable for discharge  Consultants: Nephrology  Procedures: None  Antimicrobials: None  DVT prophylaxis: Eliquis   Objective: Vitals:   06/18/21 0450 06/18/21 0500 06/18/21 0639 06/18/21  0735  BP: 114/76  127/66 (!) 109/52  Pulse: (!) 105   67  Resp: 18  20 19   Temp: 98 F (36.7 C)   98.1 F (36.7 C)  TempSrc: Oral   Axillary  SpO2: 96%   92%  Weight:  131.5 kg    Height:        Intake/Output Summary (Last 24 hours) at 06/18/2021 0906 Last data filed at 06/18/2021 0904 Gross per 24 hour  Intake 580 ml  Output 3150 ml  Net -2570 ml   Filed Weights   06/16/21 0400 06/16/21 2329 06/18/21 0500  Weight: 129.8 kg 128.5 kg 131.5 kg   Body mass index is 37.22  kg/m.  Exam:  General: 70 y.o. year-old male well developed well nourished in no acute distress.  Alert and oriented x3. Cardiovascular: Regular rate and rhythm with no rubs or gallops.  No thyromegaly or JVD noted.   Respiratory: Clear to auscultation with no wheezes or rales. Good inspiratory effort. Abdomen: Soft nontender nondistended with normal bowel sounds x4 quadrants. Musculoskeletal: No lower extremity edema. 2/4 pulses in all 4 extremities. Skin: No ulcerative lesions noted or rashes, Psychiatry: Mood is appropriate for condition and setting Neurology:    Data Reviewed: CBC: Recent Labs  Lab 06/12/21 1834 06/13/21 0319 06/14/21 0436 06/16/21 0428 06/17/21 0924 06/17/21 2047 06/18/21 0414  WBC 18.4*   < > 14.1* 15.6* 11.8* 15.5* 14.2*  NEUTROABS 15.2*  --   --   --   --  13.8*  --   HGB 13.7   < > 11.8* 11.6* 12.1* 12.1* 11.1*  HCT 41.6   < > 38.0* 37.3* 38.3* 38.6* 35.9*  MCV 104.5*   < > 104.1* 104.8* 105.2* 104.0* 103.8*  PLT 482*   < > 444* 390 335 354 295   < > = values in this interval not displayed.   Basic Metabolic Panel: Recent Labs  Lab 06/14/21 0436 06/15/21 0509 06/15/21 0842 06/16/21 0428 06/17/21 0924 06/17/21 2041 06/18/21 0414  NA 133* 133*  --  132* 134* 133*  --   K 5.1 5.2* 5.7* 5.0 6.1* 5.4*  --   CL 103 102  --  102 103 100  --   CO2 21* 22  --  22 22 21*  --   GLUCOSE 194* 131*  --  119* 148* 159*  --   BUN 67* 90*  --  97* 97* 87*  --   CREATININE 3.18* 3.71*  --  3.58* 3.12* 3.12*  --   CALCIUM 9.2 8.8*  --  8.5* 8.6* 8.5*  --   MG  --  2.4  --  2.3 2.8*  --  2.4  PHOS  --  4.8*  --  4.8* 5.2*  --  4.4   GFR: Estimated Creatinine Clearance: 31.3 mL/min (A) (by C-G formula based on SCr of 3.12 mg/dL (H)). Liver Function Tests: Recent Labs  Lab 06/12/21 1834  AST 25  ALT 17  ALKPHOS 92  BILITOT 1.3*  PROT 7.8  ALBUMIN 2.4*   No results for input(s): LIPASE, AMYLASE in the last 168 hours. No results for input(s):  AMMONIA in the last 168 hours. Coagulation Profile: No results for input(s): INR, PROTIME in the last 168 hours. Cardiac Enzymes: No results for input(s): CKTOTAL, CKMB, CKMBINDEX, TROPONINI in the last 168 hours. BNP (last 3 results) No results for input(s): PROBNP in the last 8760 hours. HbA1C: No results for input(s): HGBA1C in the last 72 hours.  CBG: Recent Labs  Lab 06/17/21 1128 06/17/21 1634 06/17/21 2100 06/17/21 2342 06/18/21 0731  GLUCAP 175* 104* 195* 97 161*   Lipid Profile: No results for input(s): CHOL, HDL, LDLCALC, TRIG, CHOLHDL, LDLDIRECT in the last 72 hours. Thyroid Function Tests: No results for input(s): TSH, T4TOTAL, FREET4, T3FREE, THYROIDAB in the last 72 hours. Anemia Panel: No results for input(s): VITAMINB12, FOLATE, FERRITIN, TIBC, IRON, RETICCTPCT in the last 72 hours. Urine analysis:    Component Value Date/Time   COLORURINE YELLOW (A) 06/13/2021 0319   APPEARANCEUR HAZY (A) 06/13/2021 0319   LABSPEC 1.010 06/13/2021 0319   PHURINE 5.0 06/13/2021 0319   GLUCOSEU NEGATIVE 06/13/2021 0319   HGBUR SMALL (A) 06/13/2021 0319   BILIRUBINUR NEGATIVE 06/13/2021 0319   KETONESUR NEGATIVE 06/13/2021 0319   PROTEINUR NEGATIVE 06/13/2021 0319   NITRITE NEGATIVE 06/13/2021 0319   LEUKOCYTESUR TRACE (A) 06/13/2021 0319   Sepsis Labs: @LABRCNTIP (procalcitonin:4,lacticidven:4)  ) Recent Results (from the past 240 hour(s))  Resp Panel by RT-PCR (Flu A&B, Covid) Nasopharyngeal Swab     Status: None   Collection Time: 06/12/21  6:35 PM   Specimen: Nasopharyngeal Swab; Nasopharyngeal(NP) swabs in vial transport medium  Result Value Ref Range Status   SARS Coronavirus 2 by RT PCR NEGATIVE NEGATIVE Final    Comment: (NOTE) SARS-CoV-2 target nucleic acids are NOT DETECTED.  The SARS-CoV-2 RNA is generally detectable in upper respiratory specimens during the acute phase of infection. The lowest concentration of SARS-CoV-2 viral copies this assay can detect  is 138 copies/mL. A negative result does not preclude SARS-Cov-2 infection and should not be used as the sole basis for treatment or other patient management decisions. A negative result may occur with  improper specimen collection/handling, submission of specimen other than nasopharyngeal swab, presence of viral mutation(s) within the areas targeted by this assay, and inadequate number of viral copies(<138 copies/mL). A negative result must be combined with clinical observations, patient history, and epidemiological information. The expected result is Negative.  Fact Sheet for Patients:  EntrepreneurPulse.com.au  Fact Sheet for Healthcare Providers:  IncredibleEmployment.be  This test is no t yet approved or cleared by the [place] FDA and  has been authorized for detection and/or diagnosis of SARS-CoV-2 by FDA under an Emergency Use Authorization (EUA). This EUA will remain  in effect (meaning this test can be used) for the duration of the COVID-19 declaration under Section 564(b)(1) of the Act, 21 U.S.C.section 360bbb-3(b)(1), unless the authorization is terminated  or revoked sooner.       Influenza A by PCR NEGATIVE NEGATIVE Final   Influenza B by PCR NEGATIVE NEGATIVE Final    Comment: (NOTE) The Xpert Xpress SARS-CoV-2/FLU/RSV plus assay is intended as an aid in the diagnosis of influenza from Nasopharyngeal swab specimens and should not be used as a sole basis for treatment. Nasal washings and aspirates are unacceptable for Xpert Xpress SARS-CoV-2/FLU/RSV testing.  Fact Sheet for Patients: EntrepreneurPulse.com.au  Fact Sheet for Healthcare Providers: IncredibleEmployment.be  This test is not yet approved or cleared by the [place] FDA and has been authorized for detection and/or diagnosis of SARS-CoV-2 by FDA under an Emergency Use Authorization (EUA). This EUA will remain in effect  (meaning this test can be used) for the duration of the COVID-19 declaration under Section 564(b)(1) of the Act, 21 U.S.C. section 360bbb-3(b)(1), unless the authorization is terminated or revoked.  Performed at [facility], [address], [place], [place] [address]   Blood culture (routine x 2)  Status: None   Collection Time: 06/12/21  7:48 PM   Specimen: BLOOD  Result Value Ref Range Status   Specimen Description BLOOD RAC  Final   Special Requests   Final    BOTTLES DRAWN AEROBIC AND ANAEROBIC Blood Culture results may not be optimal due to an inadequate volume of blood received in culture bottles   Culture   Final    NO GROWTH 5 DAYS Performed at [facility], [address] [place], [place] [address]    Report Status 06/17/2021 FINAL  Final  Blood culture (routine x 2)     Status: None   Collection Time: 06/12/21  7:48 PM   Specimen: BLOOD  Result Value Ref Range Status   Specimen Description BLOOD BLOOD LEFT HAND  Final   Special Requests   Final    BOTTLES DRAWN AEROBIC AND ANAEROBIC Blood Culture results may not be optimal due to an inadequate volume of blood received in culture bottles   Culture   Final    NO GROWTH 5 DAYS Performed at [facility], [address], [place], [place] [address]    Report Status 06/17/2021 FINAL  Final      Studies: DG Chest Port 1 View  Result Date: 06/17/2021 CLINICAL DATA:  Shortness of breath. EXAM: PORTABLE CHEST 1 VIEW COMPARISON:  Chest radiograph dated 06/12/2021. FINDINGS: No focal consolidation, pleural effusion, or pneumothorax. Mild cardiomegaly. No acute osseous pathology. IMPRESSION: No acute cardiopulmonary process. Mild cardiomegaly. Electronically Signed   By: Anner Crete M.D.   On: 06/17/2021 20:42    Scheduled Meds:  apixaban  5 mg Oral BID   aspirin EC  81 mg Oral Daily   [START ON 06/19/2021] azithromycin  250 mg Oral Daily   colchicine  0.3 mg Oral Daily   diltiazem  300  mg Oral Daily   docusate sodium  100 mg Oral BID   famotidine  20 mg Oral Daily   hydrALAZINE  10 mg Oral Q8H   insulin aspart  0-5 Units Subcutaneous QHS   insulin aspart  0-9 Units Subcutaneous TID WC   melatonin  5 mg Oral QHS   methylPREDNISolone (SOLU-MEDROL) injection  40 mg Intravenous Daily   mometasone-formoterol  2 puff Inhalation BID   multivitamin with minerals  1 tablet Oral Daily   sodium chloride flush  3 mL Intravenous Q12H   sodium zirconium cyclosilicate  10 g Oral BID   tamsulosin  0.4 mg Oral QPC supper    Continuous Infusions:  cefTRIAXone (ROCEPHIN)  IV 1 g (06/17/21 2313)     LOS: 6 days     Cristal Deer, MD [facility]  To reach me or the doctor on call, go to: [email or web address] Password [email or web address]  06/18/2021, 9:06 AM

## 2021-06-18 NOTE — Progress Notes (Signed)
At start of shift, pt c/o feeling SOB. Sats 100% 2Lnc. RR in 20s, omewhat labored but able to speak in full sentences. Lung sounds diminished, no cough. Provider and RT notified. Labs/CXR ordered. Educated pt on incentive spirometer and RT educated pt on flutter valve.  Also no BM since admit; bowel meds given per order.  Pt later feeling anxious and still SOB; xanax given and RT placed pt on bipap per order. Pt feeling less anxious after xanax and able to sleep; tolerated bipap well overnight and resps less labored.  Still no BM by 0600, pt does not want suppository at this time; wants to sleep more. States breathing feels "Much better" this morning.

## 2021-06-19 LAB — BASIC METABOLIC PANEL WITH GFR
Anion gap: 7 (ref 5–15)
BUN: 100 mg/dL — ABNORMAL HIGH (ref 8–23)
CO2: 25 mmol/L (ref 22–32)
Calcium: 8.3 mg/dL — ABNORMAL LOW (ref 8.9–10.3)
Chloride: 103 mmol/L (ref 98–111)
Creatinine, Ser: 3 mg/dL — ABNORMAL HIGH (ref 0.61–1.24)
GFR, Estimated: 22 mL/min — ABNORMAL LOW
Glucose, Bld: 98 mg/dL (ref 70–99)
Potassium: 5.2 mmol/L — ABNORMAL HIGH (ref 3.5–5.1)
Sodium: 135 mmol/L (ref 135–145)

## 2021-06-19 LAB — PROCALCITONIN: Procalcitonin: 3.54 ng/mL

## 2021-06-19 LAB — GLUCOSE, CAPILLARY
Glucose-Capillary: 71 mg/dL (ref 70–99)
Glucose-Capillary: 101 mg/dL — ABNORMAL HIGH (ref 70–99)
Glucose-Capillary: 158 mg/dL — ABNORMAL HIGH (ref 70–99)
Glucose-Capillary: 138 mg/dL — ABNORMAL HIGH (ref 70–99)

## 2021-06-19 MED ORDER — BACITRACIN-NEOMYCIN-POLYMYXIN OINTMENT TUBE
TOPICAL_OINTMENT | Freq: Two times a day (BID) | CUTANEOUS | Status: DC
  Filled 2021-06-19: qty 14.17

## 2021-06-19 MED ORDER — SODIUM ZIRCONIUM CYCLOSILICATE 10 G PO PACK
10.0000 g | PACK | Freq: Every day | ORAL | Status: DC
  Administered 2021-06-19: 10 g via ORAL
  Filled 2021-06-19 (×2): qty 1

## 2021-06-19 NOTE — Plan of Care (Signed)
  Problem: Health Behavior/Discharge Planning: Goal: Ability to manage health-related needs will improve 06/19/2021 1611 by Cristela Blue, RN Outcome: Progressing 06/19/2021 1611 by Cristela Blue, RN Outcome: Progressing   Problem: Clinical Measurements: Goal: Ability to maintain clinical measurements within normal limits will improve 06/19/2021 1611 by Cristela Blue, RN Outcome: Progressing 06/19/2021 1611 by Cristela Blue, RN Outcome: Progressing Goal: Will remain free from infection 06/19/2021 1611 by Cristela Blue, RN Outcome: Progressing 06/19/2021 1611 by Cristela Blue, RN Outcome: Progressing Goal: Diagnostic test results will improve 06/19/2021 1611 by Cristela Blue, RN Outcome: Progressing 06/19/2021 1611 by Cristela Blue, RN Outcome: Progressing Goal: Respiratory complications will improve 06/19/2021 1611 by Cristela Blue, RN Outcome: Progressing 06/19/2021 1611 by Cristela Blue, RN Outcome: Progressing Goal: Cardiovascular complication will be avoided 06/19/2021 1611 by Cristela Blue, RN Outcome: Progressing 06/19/2021 1611 by Cristela Blue, RN Outcome: Progressing

## 2021-06-19 NOTE — TOC Progression Note (Signed)
Transition of Care [facility]) - Progression Note    Patient Details  Name: Matthew [name] MRN: [id number] Date of Birth: [birth year]  Transition of Care [facility]) CM/SW [place], LCSW Phone Number: 06/19/2021, 9:41 AM  Clinical Narrative:   Left VM for patient's wife requesting a return call regarding chosen SNF.    Expected Discharge Plan: [place] Barriers to Discharge: Continued Medical Work up  Expected Discharge Plan and Services Expected Discharge Plan: [place] In-house Referral: Clinical Social Work   Post Acute Care Choice: [place] Living arrangements for the past 2 months: Single Family Home                                       Social Determinants of Health (SDOH) Interventions    Readmission Risk Interventions No flowsheet data found.

## 2021-06-19 NOTE — Plan of Care (Signed)
  Problem: Education: Goal: Knowledge of disease or condition will improve Outcome: Progressing Goal: Knowledge of the prescribed therapeutic regimen will improve Outcome: Progressing Goal: Individualized Educational Video(s) Outcome: Progressing   Problem: Activity: Goal: Ability to tolerate increased activity will improve Outcome: Progressing Goal: Will verbalize the importance of balancing activity with adequate rest periods Outcome: Progressing   Problem: Respiratory: Goal: Ability to maintain a clear airway will improve Outcome: Progressing Goal: Levels of oxygenation will improve Outcome: Progressing Goal: Ability to maintain adequate ventilation will improve Outcome: Progressing   Problem: Respiratory: Goal: Ability to maintain a clear airway will improve Outcome: Progressing Goal: Levels of oxygenation will improve Outcome: Progressing Goal: Ability to maintain adequate ventilation will improve Outcome: Progressing

## 2021-06-19 NOTE — Progress Notes (Signed)
[facility]  ROUNDING NOTE   Subjective:   Patient seen today on second floor Patient resting comfortably in the bed. Patient offers no new specific physical complaint.   Objective:  Vital signs in last 24 hours:  Temp:  [97.5 F (36.4 C)-98.6 F (37 C)] 98.6 F (37 C) (11/06 0758) Pulse Rate:  [64-101] 94 (11/06 0758) Resp:  [17-18] 18 (11/06 0758) BP: (91-129)/(51-83) 129/83 (11/06 0758) SpO2:  [91 %-100 %] 100 % (11/06 0758) Weight:  [131.1 kg] 131.1 kg (11/06 0657)  Weight change: -0.398 kg Filed Weights   06/16/21 2329 06/18/21 0500 06/19/21 0657  Weight: 128.5 kg 131.5 kg 131.1 kg    Intake/Output: I/O last 3 completed shifts: In: 2020 [P.O.:1920; IV Piggyback:100] Out: 4235 [Urine:3475]   Intake/Output this shift:  No intake/output data recorded.  Physical Exam: General: NAD  Head: Normocephalic, atraumatic. Moist oral mucosal membranes  Eyes: Anicteric  Lungs:  Clear to auscultation, normal effort  Heart: Regular rate and rhythm  Abdomen:  Soft, nontender  Extremities:  no peripheral edema.  Neurologic: Nonfocal, moving all four extremities  Skin: No lesions       Basic Metabolic Panel: Recent Labs  Lab 06/15/21 0509 06/15/21 0842 06/16/21 0428 06/17/21 0924 06/17/21 2041 06/18/21 0414 06/19/21 0650  NA 133*  --  132* 134* 133* 134* 135  K 5.2*   < > 5.0 6.1* 5.4* 5.8* 5.2*  CL 102  --  102 103 100 103 103  CO2 22  --  22 22 21* 22 25  GLUCOSE 131*  --  119* 148* 159* 111* 98  BUN 90*  --  97* 97* 87* 104* 100*  CREATININE 3.71*  --  3.58* 3.12* 3.12* 2.91* 3.00*  CALCIUM 8.8*  --  8.5* 8.6* 8.5* 8.3* 8.3*  MG 2.4  --  2.3 2.8*  --  2.4  --   PHOS 4.8*  --  4.8* 5.2*  --  4.4  --    < > = values in this interval not displayed.    Liver Function Tests: Recent Labs  Lab 06/12/21 1834  AST 25  ALT 17  ALKPHOS 92  BILITOT 1.3*  PROT 7.8  ALBUMIN 2.4*   No results for input(s): LIPASE, AMYLASE in the last 168  hours. No results for input(s): AMMONIA in the last 168 hours.  CBC: Recent Labs  Lab 06/12/21 1834 06/13/21 0319 06/14/21 0436 06/16/21 0428 06/17/21 0924 06/17/21 2047 06/18/21 0414  WBC 18.4*   < > 14.1* 15.6* 11.8* 15.5* 14.2*  NEUTROABS 15.2*  --   --   --   --  13.8*  --   HGB 13.7   < > 11.8* 11.6* 12.1* 12.1* 11.1*  HCT 41.6   < > 38.0* 37.3* 38.3* 38.6* 35.9*  MCV 104.5*   < > 104.1* 104.8* 105.2* 104.0* 103.8*  PLT 482*   < > 444* 390 335 354 295   < > = values in this interval not displayed.    Cardiac Enzymes: No results for input(s): CKTOTAL, CKMB, CKMBINDEX, TROPONINI in the last 168 hours.  BNP: Invalid input(s): POCBNP  CBG: Recent Labs  Lab 06/18/21 0731 06/18/21 1206 06/18/21 1628 06/18/21 2148 06/19/21 0756  GLUCAP 161* 190* 106* 173* 71    Microbiology: Results for orders placed or performed during the hospital encounter of 06/12/21  Resp Panel by RT-PCR (Flu A&B, Covid) Nasopharyngeal Swab     Status: None   Collection Time: 06/12/21  6:35 PM  Specimen: Nasopharyngeal Swab; Nasopharyngeal(NP) swabs in vial transport medium  Result Value Ref Range Status   SARS Coronavirus 2 by RT PCR NEGATIVE NEGATIVE Final    Comment: (NOTE) SARS-CoV-2 target nucleic acids are NOT DETECTED.  The SARS-CoV-2 RNA is generally detectable in upper respiratory specimens during the acute phase of infection. The lowest concentration of SARS-CoV-2 viral copies this assay can detect is 138 copies/mL. A negative result does not preclude SARS-Cov-2 infection and should not be used as the sole basis for treatment or other patient management decisions. A negative result may occur with  improper specimen collection/handling, submission of specimen other than nasopharyngeal swab, presence of viral mutation(s) within the areas targeted by this assay, and inadequate number of viral copies(<138 copies/mL). A negative result must be combined with clinical observations,  patient history, and epidemiological information. The expected result is Negative.  Fact Sheet for Patients:  EntrepreneurPulse.com.au  Fact Sheet for Healthcare Providers:  IncredibleEmployment.be  This test is no t yet approved or cleared by the [place] FDA and  has been authorized for detection and/or diagnosis of SARS-CoV-2 by FDA under an Emergency Use Authorization (EUA). This EUA will remain  in effect (meaning this test can be used) for the duration of the COVID-19 declaration under Section 564(b)(1) of the Act, 21 U.S.C.section 360bbb-3(b)(1), unless the authorization is terminated  or revoked sooner.       Influenza A by PCR NEGATIVE NEGATIVE Final   Influenza B by PCR NEGATIVE NEGATIVE Final    Comment: (NOTE) The Xpert Xpress SARS-CoV-2/FLU/RSV plus assay is intended as an aid in the diagnosis of influenza from Nasopharyngeal swab specimens and should not be used as a sole basis for treatment. Nasal washings and aspirates are unacceptable for Xpert Xpress SARS-CoV-2/FLU/RSV testing.  Fact Sheet for Patients: EntrepreneurPulse.com.au  Fact Sheet for Healthcare Providers: IncredibleEmployment.be  This test is not yet approved or cleared by the [place] FDA and has been authorized for detection and/or diagnosis of SARS-CoV-2 by FDA under an Emergency Use Authorization (EUA). This EUA will remain in effect (meaning this test can be used) for the duration of the COVID-19 declaration under Section 564(b)(1) of the Act, 21 U.S.C. section 360bbb-3(b)(1), unless the authorization is terminated or revoked.  Performed at [facility], [address] [place], [place] [address]   Blood culture (routine x 2)     Status: None   Collection Time: 06/12/21  7:48 PM   Specimen: BLOOD  Result Value Ref Range Status   Specimen Description BLOOD RAC  Final   Special Requests   Final     BOTTLES DRAWN AEROBIC AND ANAEROBIC Blood Culture results may not be optimal due to an inadequate volume of blood received in culture bottles   Culture   Final    NO GROWTH 5 DAYS Performed at [facility], [address] [place], [place] [address]    Report Status 06/17/2021 FINAL  Final  Blood culture (routine x 2)     Status: None   Collection Time: 06/12/21  7:48 PM   Specimen: BLOOD  Result Value Ref Range Status   Specimen Description BLOOD BLOOD LEFT HAND  Final   Special Requests   Final    BOTTLES DRAWN AEROBIC AND ANAEROBIC Blood Culture results may not be optimal due to an inadequate volume of blood received in culture bottles   Culture   Final    NO GROWTH 5 DAYS Performed at [facility], [address],  [place], [place] [address]    Report Status 06/17/2021 FINAL  Final    Coagulation Studies: No results for input(s): LABPROT, INR in the last 72 hours.  Urinalysis: No results for input(s): COLORURINE, LABSPEC, PHURINE, GLUCOSEU, HGBUR, BILIRUBINUR, KETONESUR, PROTEINUR, UROBILINOGEN, NITRITE, LEUKOCYTESUR in the last 72 hours.  Invalid input(s): APPERANCEUR    Imaging: DG Chest Port 1 View  Result Date: 06/17/2021 CLINICAL DATA:  Shortness of breath. EXAM: PORTABLE CHEST 1 VIEW COMPARISON:  Chest radiograph dated 06/12/2021. FINDINGS: No focal consolidation, pleural effusion, or pneumothorax. Mild cardiomegaly. No acute osseous pathology. IMPRESSION: No acute cardiopulmonary process. Mild cardiomegaly. Electronically Signed   By: Anner Crete M.D.   On: 06/17/2021 20:42     Medications:    cefTRIAXone (ROCEPHIN)  IV 1 g (06/18/21 2305)    apixaban  5 mg Oral BID   aspirin EC  81 mg Oral Daily   azithromycin  250 mg Oral Daily   colchicine  0.3 mg Oral Daily   diltiazem  300 mg Oral Daily   docusate sodium  100 mg Oral BID   famotidine  20 mg Oral Daily   hydrALAZINE  10 mg Oral Q8H   insulin aspart  0-5 Units Subcutaneous QHS    insulin aspart  0-9 Units Subcutaneous TID WC   melatonin  5 mg Oral QHS   methylPREDNISolone (SOLU-MEDROL) injection  40 mg Intravenous Daily   mometasone-formoterol  2 puff Inhalation BID   multivitamin with minerals  1 tablet Oral Daily   sodium chloride flush  3 mL Intravenous Q12H   sodium zirconium cyclosilicate  10 g Oral Daily   tamsulosin  0.4 mg Oral QPC supper   acetaminophen **OR** acetaminophen, albuterol, bisacodyl, hydrALAZINE, ondansetron (ZOFRAN) IV, oxyCODONE, polyethylene glycol  Assessment/ Plan:  Matthew [name] is a 70 y.o.  male with medical problems of hypertension, atrial fibrillation, chronic edema, LVH,   was admitted on 06/12/2021 for :SOB (shortness of breath) [R06.02] Bilateral leg edema [R60.0] Acute respiratory failure with hypoxia ([place]) [J96.01] Atrial fibrillation with RVR ([place]) [I48.91]      1)Renal    Acute kidney injury secondary to ATN Patient had AKI secondary to multiple factors Patient was on ACE blocker/NSAIDs Patient had hemodynamic instability as patient had A. 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 Patient had a peak creatinine of 3.7 during this admission Patient creatinine is trending down and is now at 2.9--3.0    2)HTN    Blood pressure is stable    3)Anemia of chronic disease  CBC Latest Ref Rng & Units 06/18/2021 06/17/2021 06/17/2021  WBC 4.0 - 10.5 K/uL 14.2(H) 15.5(H) 11.8(H)  Hemoglobin 13.0 - 17.0 g/dL 11.1(L) 12.1(L) 12.1(L)  Hematocrit 39.0 - 52.0 % 35.9(L) 38.6(L) 38.3(L)  Platelets 150 - 400 K/uL 295 354 335       HGb at goal (9--11)   4) Secondary hyperparathyroidism -CKD Mineral-Bone Disorder    Lab Results  Component Value Date   CALCIUM 8.3 (L) 06/19/2021   CAION 1.52 (HH) 05/19/2020   PHOS 4.4 06/18/2021    Phosphorus at goal.   5)Hyperkalemia Will give Lokelma     6) Hyponatremia Patient sodium  is stable   7)Acid base    Co2 at goal    Plan   Will give Lokelma    LOS: 7 Twana Wileman [facility] 11/6/20229:09 AM

## 2021-06-19 NOTE — Progress Notes (Signed)
PROGRESS NOTE  FREMAN LAPAGE ALP:[id number] DOB: [birth year] DOA: 06/12/2021 PCP: [name], MD  HPI/Recap of past 24 hours: Brief Narrative:   This is a 70 year old male with past medical history significant for hypertension, asthma, atrial fibrillation, he is on Eliquis for this, CKD stage IIIb, vitamin B12 deficiency, morbid obesity, presenting with acute shortness of breath to the emergency department.  Patient was initially started on steroids and IV Lasix he was also found to have A. fib with rapid ventricular rate.  Patient was admitted for asthma exacerbation and atrial fibrillation with rapid ventricular rate his serum creatinine was noted to be worsening and nephrology was consulted  Subjective: June 18, 2021: Patient seen and examined at bedside. Complain that he still has dyspnea on exertion and he does not have home O2.  We will uses BiPAP at night.  He also having tachycardia he was given Cardizem 300 mg earlier this morning but telemetry just called to say that his heart rate is in the 130s in A. fib.  June 19, 2021: Patient seen and examined at bedside he denies any new complaints, however he showed me a big ulcer in the posterior right earlobe which he stated has been there since July 2022 he stated he was in the sun exposure that he thinks he may have had too much sun and he got burned.  Patient advised will need to see a dermatologist as outpatient  Assessment/Plan: Principal Problem:   SOB (shortness of breath) Active Problems:   Moderate persistent asthma with exacerbation   Essential hypertension   COPD with acute exacerbation (HCC)   Edema of both legs   Atrial fibrillation with RVR (HCC)   Acute kidney injury superimposed on chronic kidney disease (HCC)   Elevated glucose level   Obesity (BMI 30-39.9)   Macrocytosis   Acute respiratory failure with hypoxia (HCC)   Acute gout of right ankle   Asthma exacerbation: > Improving  Continue Solu-Medrol  DuoNeb and Dulera inhaler His BNP was 279 but no edema and EF was 50-55 by echo.   Atrial fibrillation with RVR:       Heart rate is still elevated with RVR He was given a extra dose of Cardizem IR 30 mg for better rate control Continue Cardizem ER 300 mg Continue Eliquis for anticoagulation   Acute kidney injury on CKD stage IIIb: His creatinine worsened slightly with Lasix but that is on hold now. Nephrology consulted they recommended renal ultrasound, Which was done and showed no hydronephrosis but medical renal disease. Continue to monitor creatinine for improvement avoid nephrotoxic drugs   Hyperkalemia:  Likely related to AKI on CKD Slightly improved Continue leukoma 10 mg twice daily for 2 days Monitor electrolytes.  We will recheck chemistry in the morning   Right foot pain: likely due to gout his uric acid is elevated Will continue Solu-Medrol and colchicine   Essential hypertension: Continue Cardizem and hydralazine   Morbid obesity BMI 38.50: Patient will benefit with weight loss program Recommend outpatient sleep study   Hyperglycemia: Recent hemoglobin A1c is 5.9 Continue sliding scale   Leukocytosis: Could be secondary to steroid Is trending down   big ulcer in the posterior right earlobe which he stated has been there since July 2022 he stated he was in the sun exposure that he thinks he may have had too much sun and he got burned.  Patient advised will need to see a dermatologist as outpatient I will order Neosporin to apply to  the ulcer twice daily    Code Status: Full  Severity of Illness:   Patient is inpatient stay due to continued mild medication management unstable for discharge  Family Communication: None at bedside  Disposition Plan:   Status is: Inpatient   Dispo: The patient is from: Home              Anticipated d/c is to:               Anticipated d/c date is:               Patient currently not medically stable for  discharge  Consultants: Nephrology  Procedures: None  Antimicrobials: None  DVT prophylaxis: Eliquis   Objective: Vitals:   06/19/21 0758 06/19/21 1133 06/19/21 1649 06/19/21 2011  BP: 129/83 121/77 130/68 121/80  Pulse: 94 67 95 98  Resp: 18 18 18    Temp: 98.6 F (37 C) 98.4 F (36.9 C) 98.5 F (36.9 C) (!) 97.3 F (36.3 C)  TempSrc:    Oral  SpO2: 100% 100% 100% 99%  Weight:      Height:        Intake/Output Summary (Last 24 hours) at 06/19/2021 2027 Last data filed at 06/19/2021 1929 Gross per 24 hour  Intake 960 ml  Output 2525 ml  Net -1565 ml    Filed Weights   06/16/21 2329 06/18/21 0500 06/19/21 0657  Weight: 128.5 kg 131.5 kg 131.1 kg   Body mass index is 37.11 kg/m.  Exam:  General: 70 y.o. year-old male well developed well nourished in no acute distress.  Alert and oriented x3. Cardiovascular: Regular rate and rhythm with no rubs or gallops.  No thyromegaly or JVD noted.   Respiratory: Clear to auscultation with no wheezes or rales. Good inspiratory effort. Abdomen: Soft nontender nondistended with normal bowel sounds x4 quadrants. Musculoskeletal: No lower extremity edema. 2/4 pulses in all 4 extremities. Skin: No ulcerative lesions noted or rashes, Psychiatry: Mood is appropriate for condition and setting Neurology:    Data Reviewed: CBC: Recent Labs  Lab 06/14/21 0436 06/16/21 0428 06/17/21 0924 06/17/21 2047 06/18/21 0414  WBC 14.1* 15.6* 11.8* 15.5* 14.2*  NEUTROABS  --   --   --  13.8*  --   HGB 11.8* 11.6* 12.1* 12.1* 11.1*  HCT 38.0* 37.3* 38.3* 38.6* 35.9*  MCV 104.1* 104.8* 105.2* 104.0* 103.8*  PLT 444* 390 335 354 017    Basic Metabolic Panel: Recent Labs  Lab 06/15/21 0509 06/15/21 0842 06/16/21 0428 06/17/21 0924 06/17/21 2041 06/18/21 0414 06/19/21 0650  NA 133*  --  132* 134* 133* 134* 135  K 5.2*   < > 5.0 6.1* 5.4* 5.8* 5.2*  CL 102  --  102 103 100 103 103  CO2 22  --  22 22 21* 22 25  GLUCOSE 131*   --  119* 148* 159* 111* 98  BUN 90*  --  97* 97* 87* 104* 100*  CREATININE 3.71*  --  3.58* 3.12* 3.12* 2.91* 3.00*  CALCIUM 8.8*  --  8.5* 8.6* 8.5* 8.3* 8.3*  MG 2.4  --  2.3 2.8*  --  2.4  --   PHOS 4.8*  --  4.8* 5.2*  --  4.4  --    < > = values in this interval not displayed.    GFR: Estimated Creatinine Clearance: 32.5 mL/min (A) (by C-G formula based on SCr of 3 mg/dL (H)). Liver Function Tests: No results for input(s): AST, ALT,  ALKPHOS, BILITOT, PROT, ALBUMIN in the last 168 hours.  No results for input(s): LIPASE, AMYLASE in the last 168 hours. No results for input(s): AMMONIA in the last 168 hours. Coagulation Profile: No results for input(s): INR, PROTIME in the last 168 hours. Cardiac Enzymes: No results for input(s): CKTOTAL, CKMB, CKMBINDEX, TROPONINI in the last 168 hours. BNP (last 3 results) No results for input(s): PROBNP in the last 8760 hours. HbA1C: No results for input(s): HGBA1C in the last 72 hours. CBG: Recent Labs  Lab 06/18/21 1628 06/18/21 2148 06/19/21 0756 06/19/21 1134 06/19/21 1617  GLUCAP 106* 173* 71 101* 158*    Lipid Profile: No results for input(s): CHOL, HDL, LDLCALC, TRIG, CHOLHDL, LDLDIRECT in the last 72 hours. Thyroid Function Tests: No results for input(s): TSH, T4TOTAL, FREET4, T3FREE, THYROIDAB in the last 72 hours. Anemia Panel: No results for input(s): VITAMINB12, FOLATE, FERRITIN, TIBC, IRON, RETICCTPCT in the last 72 hours. Urine analysis:    Component Value Date/Time   COLORURINE YELLOW (A) 06/13/2021 0319   APPEARANCEUR HAZY (A) 06/13/2021 0319   LABSPEC 1.010 06/13/2021 0319   PHURINE 5.0 06/13/2021 0319   GLUCOSEU NEGATIVE 06/13/2021 0319   HGBUR SMALL (A) 06/13/2021 0319   BILIRUBINUR NEGATIVE 06/13/2021 0319   KETONESUR NEGATIVE 06/13/2021 0319   PROTEINUR NEGATIVE 06/13/2021 0319   NITRITE NEGATIVE 06/13/2021 0319   LEUKOCYTESUR TRACE (A) 06/13/2021 0319   Sepsis  Labs: @LABRCNTIP (procalcitonin:4,lacticidven:4)  ) Recent Results (from the past 240 hour(s))  Resp Panel by RT-PCR (Flu A&B, Covid) Nasopharyngeal Swab     Status: None   Collection Time: 06/12/21  6:35 PM   Specimen: Nasopharyngeal Swab; Nasopharyngeal(NP) swabs in vial transport medium  Result Value Ref Range Status   SARS Coronavirus 2 by RT PCR NEGATIVE NEGATIVE Final    Comment: (NOTE) SARS-CoV-2 target nucleic acids are NOT DETECTED.  The SARS-CoV-2 RNA is generally detectable in upper respiratory specimens during the acute phase of infection. The lowest concentration of SARS-CoV-2 viral copies this assay can detect is 138 copies/mL. A negative result does not preclude SARS-Cov-2 infection and should not be used as the sole basis for treatment or other patient management decisions. A negative result may occur with  improper specimen collection/handling, submission of specimen other than nasopharyngeal swab, presence of viral mutation(s) within the areas targeted by this assay, and inadequate number of viral copies(<138 copies/mL). A negative result must be combined with clinical observations, patient history, and epidemiological information. The expected result is Negative.  Fact Sheet for Patients:  EntrepreneurPulse.com.au  Fact Sheet for Healthcare Providers:  IncredibleEmployment.be  This test is no t yet approved or cleared by the [place] FDA and  has been authorized for detection and/or diagnosis of SARS-CoV-2 by FDA under an Emergency Use Authorization (EUA). This EUA will remain  in effect (meaning this test can be used) for the duration of the COVID-19 declaration under Section 564(b)(1) of the Act, 21 U.S.C.section 360bbb-3(b)(1), unless the authorization is terminated  or revoked sooner.       Influenza A by PCR NEGATIVE NEGATIVE Final   Influenza B by PCR NEGATIVE NEGATIVE Final    Comment: (NOTE) The Xpert  Xpress SARS-CoV-2/FLU/RSV plus assay is intended as an aid in the diagnosis of influenza from Nasopharyngeal swab specimens and should not be used as a sole basis for treatment. Nasal washings and aspirates are unacceptable for Xpert Xpress SARS-CoV-2/FLU/RSV testing.  Fact Sheet for Patients: EntrepreneurPulse.com.au  Fact Sheet for Healthcare Providers: IncredibleEmployment.be  This test is  not yet approved or cleared by the [place] and has been authorized for detection and/or diagnosis of SARS-CoV-2 by FDA under an Emergency Use Authorization (EUA). This EUA will remain in effect (meaning this test can be used) for the duration of the COVID-19 declaration under Section 564(b)(1) of the Act, 21 U.S.C. section 360bbb-3(b)(1), unless the authorization is terminated or revoked.  Performed at [facility], [address] [place], [place] [address]   Blood culture (routine x 2)     Status: None   Collection Time: 06/12/21  7:48 PM   Specimen: BLOOD  Result Value Ref Range Status   Specimen Description BLOOD RAC  Final   Special Requests   Final    BOTTLES DRAWN AEROBIC AND ANAEROBIC Blood Culture results may not be optimal due to an inadequate volume of blood received in culture bottles   Culture   Final    NO GROWTH 5 DAYS Performed at [facility], [address] [place], [place] [address]    Report Status 06/17/2021 FINAL  Final  Blood culture (routine x 2)     Status: None   Collection Time: 06/12/21  7:48 PM   Specimen: BLOOD  Result Value Ref Range Status   Specimen Description BLOOD BLOOD LEFT HAND  Final   Special Requests   Final    BOTTLES DRAWN AEROBIC AND ANAEROBIC Blood Culture results may not be optimal due to an inadequate volume of blood received in culture bottles   Culture   Final    NO GROWTH 5 DAYS Performed at [facility], [address] [place], [place] [address]    Report  Status 06/17/2021 FINAL  Final      Studies: No results found.  Scheduled Meds:  apixaban  5 mg Oral BID   aspirin EC  81 mg Oral Daily   azithromycin  250 mg Oral Daily   colchicine  0.3 mg Oral Daily   diltiazem  300 mg Oral Daily   docusate sodium  100 mg Oral BID   famotidine  20 mg Oral Daily   hydrALAZINE  10 mg Oral Q8H   insulin aspart  0-5 Units Subcutaneous QHS   insulin aspart  0-9 Units Subcutaneous TID WC   melatonin  5 mg Oral QHS   methylPREDNISolone (SOLU-MEDROL) injection  40 mg Intravenous Daily   mometasone-formoterol  2 puff Inhalation BID   multivitamin with minerals  1 tablet Oral Daily   neomycin-bacitracin-polymyxin   Topical BID   sodium chloride flush  3 mL Intravenous Q12H   sodium zirconium cyclosilicate  10 g Oral Daily   tamsulosin  0.4 mg Oral QPC supper    Continuous Infusions:  cefTRIAXone (ROCEPHIN)  IV 1 g (06/18/21 2305)     LOS: 7 days     Cristal Deer, MD [facility]  To reach me or the doctor on call, go to: [email or web address] Password [email or web address]  06/19/2021, 8:27 PM

## 2021-06-20 LAB — GLUCOSE, CAPILLARY
Glucose-Capillary: 82 mg/dL (ref 70–99)
Glucose-Capillary: 155 mg/dL — ABNORMAL HIGH (ref 70–99)
Glucose-Capillary: 163 mg/dL — ABNORMAL HIGH (ref 70–99)
Glucose-Capillary: 219 mg/dL — ABNORMAL HIGH (ref 70–99)

## 2021-06-20 LAB — BASIC METABOLIC PANEL
Anion gap: 4 — ABNORMAL LOW (ref 5–15)
BUN: 99 mg/dL — ABNORMAL HIGH (ref 8–23)
CO2: 25 mmol/L (ref 22–32)
Calcium: 9.2 mg/dL (ref 8.9–10.3)
Chloride: 109 mmol/L (ref 98–111)
Creatinine, Ser: 3.18 mg/dL — ABNORMAL HIGH (ref 0.61–1.24)
GFR, Estimated: 20 mL/min — ABNORMAL LOW (ref >60)
Glucose, Bld: 119 mg/dL — ABNORMAL HIGH (ref 70–99)
Potassium: 6.2 mmol/L — ABNORMAL HIGH (ref 3.5–5.1)
Sodium: 138 mmol/L (ref 135–145)

## 2021-06-20 LAB — LEGIONELLA PNEUMOPHILA SEROGP 1 UR AG: L. pneumophila Serogp 1 Ur Ag: NEGATIVE

## 2021-06-20 MED ORDER — PATIROMER SORBITEX CALCIUM 8.4 G PO PACK
16.8000 g | PACK | Freq: Once | ORAL | Status: AC
  Administered 2021-06-20: 16.8 g via ORAL
  Filled 2021-06-20: qty 2

## 2021-06-20 MED ORDER — PREDNISONE 50 MG PO TABS
50.0000 mg | ORAL_TABLET | Freq: Every day | ORAL | Status: DC
  Administered 2021-06-21 – 2021-06-22 (×2): 50 mg via ORAL
  Filled 2021-06-20 (×2): qty 1

## 2021-06-20 MED ORDER — TRAZODONE HCL 50 MG PO TABS
25.0000 mg | ORAL_TABLET | Freq: Once | ORAL | Status: AC
  Administered 2021-06-20: 25 mg via ORAL
  Filled 2021-06-20: qty 1

## 2021-06-20 MED ORDER — SODIUM CHLORIDE 0.9 % IV SOLN: INTRAVENOUS | Status: DC

## 2021-06-20 NOTE — Plan of Care (Signed)
  Problem: Health Behavior/Discharge Planning: Goal: Ability to manage health-related needs will improve 06/20/2021 1244 by Cristela Blue, RN Outcome: Progressing 06/20/2021 1244 by Cristela Blue, RN Outcome: Progressing   Problem: Clinical Measurements: Goal: Ability to maintain clinical measurements within normal limits will improve 06/20/2021 1244 by Cristela Blue, RN Outcome: Progressing 06/20/2021 1244 by Cristela Blue, RN Outcome: Progressing Goal: Will remain free from infection 06/20/2021 1244 by Cristela Blue, RN Outcome: Progressing 06/20/2021 1244 by Cristela Blue, RN Outcome: Progressing Goal: Diagnostic test results will improve 06/20/2021 1244 by Cristela Blue, RN Outcome: Progressing 06/20/2021 1244 by Cristela Blue, RN Outcome: Progressing Goal: Respiratory complications will improve 06/20/2021 1244 by Cristela Blue, RN Outcome: Progressing 06/20/2021 1244 by Cristela Blue, RN Outcome: Progressing Goal: Cardiovascular complication will be avoided 06/20/2021 1244 by Cristela Blue, RN Outcome: Progressing 06/20/2021 1244 by Cristela Blue, RN Outcome: Progressing   Problem: Education: Goal: Knowledge of disease or condition will improve 06/20/2021 1244 by Cristela Blue, RN Outcome: Progressing 06/20/2021 1244 by Cristela Blue, RN Outcome: Progressing Goal: Knowledge of the prescribed therapeutic regimen will improve 06/20/2021 1244 by Cristela Blue, RN Outcome: Progressing 06/20/2021 1244 by Cristela Blue, RN Outcome: Progressing Goal: Individualized Educational Video(s) 06/20/2021 1244 by Cristela Blue, RN Outcome: Progressing 06/20/2021 1244 by Cristela Blue, RN Outcome: Progressing   Problem: Activity: Goal: Ability to tolerate increased activity will improve 06/20/2021 1244 by Cristela Blue, RN Outcome: Progressing 06/20/2021 1244 by Cristela Blue, RN Outcome: Progressing Goal: Will verbalize the importance of balancing activity with adequate  rest periods 06/20/2021 1244 by Cristela Blue, RN Outcome: Progressing 06/20/2021 1244 by Cristela Blue, RN Outcome: Progressing   Problem: Respiratory: Goal: Ability to maintain a clear airway will improve 06/20/2021 1244 by Cristela Blue, RN Outcome: Progressing 06/20/2021 1244 by Cristela Blue, RN Outcome: Progressing Goal: Levels of oxygenation will improve 06/20/2021 1244 by Cristela Blue, RN Outcome: Progressing 06/20/2021 1244 by Cristela Blue, RN Outcome: Progressing Goal: Ability to maintain adequate ventilation will improve 06/20/2021 1244 by Cristela Blue, RN Outcome: Progressing 06/20/2021 1244 by Cristela Blue, RN Outcome: Progressing

## 2021-06-20 NOTE — Care Management Important Message (Signed)
Important Message  Patient Details  Name: TURNER BAILLIE MRN: [id number] Date of Birth: 05/26/1951   Medicare Important Message Given:  Yes     Dannette Barbara 06/20/2021, 3:07 PM

## 2021-06-20 NOTE — Progress Notes (Signed)
[facility]  ROUNDING NOTE   Subjective:   Patient seen resting in bed, alert and oriented States he feels well today Elevated potassium of 6.2 Creatinine slowly increasing 3.18. Recorded urine output of 2125 mL in preceding 24 hours  Objective:  Vital signs in last 24 hours:  Temp:  [97.3 F (36.3 C)-98.5 F (36.9 C)] 97.5 F (36.4 C) (11/07 1202) Pulse Rate:  [79-98] 79 (11/07 1202) Resp:  [18-20] 18 (11/07 1202) BP: (114-130)/(63-80) 114/67 (11/07 1202) SpO2:  [91 %-100 %] 91 % (11/07 1202)  Weight change:  Filed Weights   06/16/21 2329 06/18/21 0500 06/19/21 0657  Weight: 128.5 kg 131.5 kg 131.1 kg    Intake/Output: I/O last 3 completed shifts: In: 1288.5 [P.O.:960; IV Piggyback:328.5] Out: 3025 [Urine:3025]   Intake/Output this shift:  Total I/O In: 0  Out: 750 [Urine:750]  Physical Exam: General: NAD  Head: Normocephalic, atraumatic. Moist oral mucosal membranes  Eyes: Anicteric  Lungs:  Clear to auscultation, normal effort  Heart: Regular rate and rhythm  Abdomen:  Soft, nontender  Extremities:  no peripheral edema.  Neurologic: Nonfocal, moving all four extremities  Skin: No lesions       Basic Metabolic Panel: Recent Labs  Lab 06/15/21 0509 06/15/21 0842 06/16/21 0428 06/17/21 0924 06/17/21 2041 06/18/21 0414 06/19/21 0650 06/20/21 0430  NA 133*  --  132* 134* 133* 134* 135 138  K 5.2*   < > 5.0 6.1* 5.4* 5.8* 5.2* 6.2*  CL 102  --  102 103 100 103 103 109  CO2 22  --  22 22 21* 22 25 25   GLUCOSE 131*  --  119* 148* 159* 111* 98 119*  BUN 90*  --  97* 97* 87* 104* 100* 99*  CREATININE 3.71*  --  3.58* 3.12* 3.12* 2.91* 3.00* 3.18*  CALCIUM 8.8*  --  8.5* 8.6* 8.5* 8.3* 8.3* 9.2  MG 2.4  --  2.3 2.8*  --  2.4  --   --   PHOS 4.8*  --  4.8* 5.2*  --  4.4  --   --    < > = values in this interval not displayed.     Liver Function Tests: No results for input(s): AST, ALT, ALKPHOS, BILITOT, PROT, ALBUMIN in the last 168  hours.  No results for input(s): LIPASE, AMYLASE in the last 168 hours. No results for input(s): AMMONIA in the last 168 hours.  CBC: Recent Labs  Lab 06/14/21 0436 06/16/21 0428 06/17/21 0924 06/17/21 2047 06/18/21 0414  WBC 14.1* 15.6* 11.8* 15.5* 14.2*  NEUTROABS  --   --   --  13.8*  --   HGB 11.8* 11.6* 12.1* 12.1* 11.1*  HCT 38.0* 37.3* 38.3* 38.6* 35.9*  MCV 104.1* 104.8* 105.2* 104.0* 103.8*  PLT 444* 390 335 354 295     Cardiac Enzymes: No results for input(s): CKTOTAL, CKMB, CKMBINDEX, TROPONINI in the last 168 hours.  BNP: Invalid input(s): POCBNP  CBG: Recent Labs  Lab 06/19/21 1134 06/19/21 1617 06/19/21 2141 06/20/21 0821 06/20/21 1154  GLUCAP 101* 158* 138* 82 155*     Microbiology: Results for orders placed or performed during the hospital encounter of 06/12/21  Resp Panel by RT-PCR (Flu A&B, Covid) Nasopharyngeal Swab     Status: None   Collection Time: 06/12/21  6:35 PM   Specimen: Nasopharyngeal Swab; Nasopharyngeal(NP) swabs in vial transport medium  Result Value Ref Range Status   SARS Coronavirus 2 by RT PCR NEGATIVE NEGATIVE Final  Comment: (NOTE) SARS-CoV-2 target nucleic acids are NOT DETECTED.  The SARS-CoV-2 RNA is generally detectable in upper respiratory specimens during the acute phase of infection. The lowest concentration of SARS-CoV-2 viral copies this assay can detect is 138 copies/mL. A negative result does not preclude SARS-Cov-2 infection and should not be used as the sole basis for treatment or other patient management decisions. A negative result may occur with  improper specimen collection/handling, submission of specimen other than nasopharyngeal swab, presence of viral mutation(s) within the areas targeted by this assay, and inadequate number of viral copies(<138 copies/mL). A negative result must be combined with clinical observations, patient history, and epidemiological information. The expected result is  Negative.  Fact Sheet for Patients:  EntrepreneurPulse.com.au  Fact Sheet for Healthcare Providers:  IncredibleEmployment.be  This test is no t yet approved or cleared by the [place] FDA and  has been authorized for detection and/or diagnosis of SARS-CoV-2 by FDA under an Emergency Use Authorization (EUA). This EUA will remain  in effect (meaning this test can be used) for the duration of the COVID-19 declaration under Section 564(b)(1) of the Act, 21 U.S.C.section 360bbb-3(b)(1), unless the authorization is terminated  or revoked sooner.       Influenza A by PCR NEGATIVE NEGATIVE Final   Influenza B by PCR NEGATIVE NEGATIVE Final    Comment: (NOTE) The Xpert Xpress SARS-CoV-2/FLU/RSV plus assay is intended as an aid in the diagnosis of influenza from Nasopharyngeal swab specimens and should not be used as a sole basis for treatment. Nasal washings and aspirates are unacceptable for Xpert Xpress SARS-CoV-2/FLU/RSV testing.  Fact Sheet for Patients: EntrepreneurPulse.com.au  Fact Sheet for Healthcare Providers: IncredibleEmployment.be  This test is not yet approved or cleared by the [place] FDA and has been authorized for detection and/or diagnosis of SARS-CoV-2 by FDA under an Emergency Use Authorization (EUA). This EUA will remain in effect (meaning this test can be used) for the duration of the COVID-19 declaration under Section 564(b)(1) of the Act, 21 U.S.C. section 360bbb-3(b)(1), unless the authorization is terminated or revoked.  Performed at [facility], [address] [place], [place] [address]   Blood culture (routine x 2)     Status: None   Collection Time: 06/12/21  7:48 PM   Specimen: BLOOD  Result Value Ref Range Status   Specimen Description BLOOD RAC  Final   Special Requests   Final    BOTTLES DRAWN AEROBIC AND ANAEROBIC Blood Culture results may not be  optimal due to an inadequate volume of blood received in culture bottles   Culture   Final    NO GROWTH 5 DAYS Performed at [facility], [address] [place], [place] [address]    Report Status 06/17/2021 FINAL  Final  Blood culture (routine x 2)     Status: None   Collection Time: 06/12/21  7:48 PM   Specimen: BLOOD  Result Value Ref Range Status   Specimen Description BLOOD BLOOD LEFT HAND  Final   Special Requests   Final    BOTTLES DRAWN AEROBIC AND ANAEROBIC Blood Culture results may not be optimal due to an inadequate volume of blood received in culture bottles   Culture   Final    NO GROWTH 5 DAYS Performed at [facility], [address] [place], [place] [address]    Report Status 06/17/2021 FINAL  Final    Coagulation Studies: No results for input(s): LABPROT, INR in the last 72 hours.  Urinalysis: No results for input(s): COLORURINE, LABSPEC, PHURINE, GLUCOSEU, HGBUR, BILIRUBINUR, KETONESUR, PROTEINUR, UROBILINOGEN, NITRITE, LEUKOCYTESUR in the last 72 hours.  Invalid input(s): APPERANCEUR    Imaging: No results found.   Medications:    cefTRIAXone (ROCEPHIN)  IV 1 g (06/19/21 2215)    apixaban  5 mg Oral BID   aspirin EC  81 mg Oral Daily   azithromycin  250 mg Oral Daily   colchicine  0.3 mg Oral Daily   diltiazem  300 mg Oral Daily   docusate sodium  100 mg Oral BID   famotidine  20 mg Oral Daily   hydrALAZINE  10 mg Oral Q8H   insulin aspart  0-5 Units Subcutaneous QHS   insulin aspart  0-9 Units Subcutaneous TID WC   melatonin  5 mg Oral QHS   mometasone-formoterol  2 puff Inhalation BID   multivitamin with minerals  1 tablet Oral Daily   neomycin-bacitracin-polymyxin   Topical BID   [START ON 06/21/2021] predniSONE  50 mg Oral Q breakfast   sodium chloride flush  3 mL Intravenous Q12H   tamsulosin  0.4 mg Oral QPC supper   acetaminophen **OR** acetaminophen, albuterol, bisacodyl, hydrALAZINE, ondansetron (ZOFRAN) IV,  oxyCODONE, polyethylene glycol  Assessment/ Plan:  Matthew [name] is a 70 y.o.  male with medical problems of hypertension, atrial fibrillation, chronic edema, LVH,   was admitted on 06/12/2021 for :SOB (shortness of breath) [R06.02] Bilateral leg edema [R60.0] Acute respiratory failure with hypoxia (HCC) [J96.01] Atrial fibrillation with RVR ([place]) [I48.91]   Acute kidney injury on chronic kidney disease stage IIIb Baseline creatinine 1.81/GFR 40 from December 04, 2020 The cause for AKI is not entirely clear but possibly related to hemodynamic instability from A. fib and RVR. Urinalysis at admission appears benign Renal imaging negative for obstruction.  Creatinine has began trending upward.  Will order gentle hydration with normal saline at 50 mL/h.   Agree with holding lisinopril and meloxicam. Avoid nonsteroidals, IV contrast, hypotension Will require discharge follow up with our office.    2. Chronic lower extremity edema No diuretics at home   3. Hyperkalemia/hyponatremia Likely from AKI. Current potassium 6.2, will order [place] 16.4 oral once Sodium stable at 138    4. Hesitancy of urination Resolved with tamsulosin    LOS: 8   11/7/202212:27 PM

## 2021-06-20 NOTE — Progress Notes (Signed)
OT Cancellation Note  Patient Details Name: Matthew [name] MRN: [id number] DOB: 1950/09/24   Cancelled Treatment:    Reason Eval/Treat Not Completed: Medical issues which prohibited therapy. Chart reviewed - pt noted to have K+ critically high at 6.2; contraindicated for exertional activity at this time. Will continue to follow and initiate services as pt medically appropriate to participate in therapy.    Fredirick Maudlin, OTR/L [place]

## 2021-06-20 NOTE — Progress Notes (Signed)
Patient is very anxious tonight about not being able to sleep. He states he does not sleep at home either. He is awake and sitting on the side of the bed at this time. Provider notified to request further intervention.

## 2021-06-20 NOTE — Progress Notes (Signed)
PT Cancellation Note  Patient Details Name: Matthew [name] MRN: [id number] DOB: October 22, 1950   Cancelled Treatment:    Reason Eval/Treat Not Completed: Other (comment). Pt with elevated K+ at 6.2, not appropriate for exertional activity at this time. Will re-attempt another time.   [name] 06/20/2021, 10:11 AM Greggory Stallion, PT, DPT [phone]

## 2021-06-20 NOTE — Progress Notes (Signed)
PROGRESS NOTE  Matthew [name] SVX:[id number] DOB: Dec 20, 1950 DOA: 06/12/2021 PCP: [name], MD  HPI/Recap of past 24 hours: Brief Narrative:   This is a 70 year old male with past medical history significant for hypertension, asthma, atrial fibrillation, he is on Eliquis for this, CKD stage IIIb, vitamin B12 deficiency, morbid obesity, presenting with acute shortness of breath to the emergency department.  Patient was initially started on steroids and IV Lasix he was also found to have A. fib with rapid ventricular rate.  Patient was admitted for asthma exacerbation and atrial fibrillation with rapid ventricular rate his serum creatinine was noted to be worsening and nephrology was consulted. Heart rate currently improved.  Worsening potassium and renal function. PT is recommending SNF-still not medically stable.  Subjective: Patient was seen and examined today.  No new complaints.  Denies any nausea or vomiting.  Assessment/Plan: Principal Problem:   SOB (shortness of breath) Active Problems:   Moderate persistent asthma with exacerbation   Essential hypertension   COPD with acute exacerbation (HCC)   Edema of both legs   Atrial fibrillation with RVR (HCC)   Acute kidney injury superimposed on chronic kidney disease (HCC)   Elevated glucose level   Obesity (BMI 30-39.9)   Macrocytosis   Acute respiratory failure with hypoxia (HCC)   Acute gout of right ankle   Asthma exacerbation: Resolved. His BNP was 279 but no edema and EF was 50-55 by echo.  He was started on antibiotics for concern of CAP, no radiologic infiltrate but procalcitonin was elevated -Switch Solu-Medrol with prednisone for another 3 more days. -Continue with ceftriaxone and Zithromax to complete a 5-day course -Continue with bronchodilators -Continue with supportive care -Weaned off from oxygen as tolerated   Atrial fibrillation with RVR: Heart rate seems to be well controlled now. -Continue Cardizem ER  300 mg -Continue Eliquis for anticoagulation   Acute kidney injury on CKD stage IIIb: Renal function continued to get worse.  Renal ultrasound without any hydronephrosis but did show medical renal disease.  Initial concern of some worsening with Lasix which is now on hold. Nephrology on board-appreciate their help. Continue to monitor creatinine for improvement avoid nephrotoxic drugs   Hyperkalemia: Worsening potassium, at 6.2 today -We will start was ordered by nephrology Likely related to AKI on CKD -Monitor potassium  Right foot pain: Improving likely due to gout his uric acid is elevated Will continue prednisone and colchicine   Essential hypertension: Continue Cardizem and hydralazine   Morbid obesity BMI 38.50: Patient will benefit with weight loss program Recommend outpatient sleep study   Hyperglycemia: Recent hemoglobin A1c is 5.9 Continue sliding scale   Leukocytosis: Could be secondary to steroid Is trending down  Big ulcer in the posterior right earlobe which he stated has been there since July 2022 he stated he was in the sun exposure that he thinks he may have had too much sun and he got burned.  Patient advised will need to see a dermatologist as outpatient I will order Neosporin to apply to the ulcer twice daily   Code Status: Full  Severity of Illness:   Patient is inpatient stay due to continued mild medication management unstable for discharge  Family Communication: None at bedside  Disposition Plan:   Status is: Inpatient   Dispo: The patient is from: Home              Anticipated d/c is to: SNF  Anticipated d/c date is:               Patient currently not medically stable for discharge  Consultants: Nephrology  Procedures: None  Antimicrobials: Ceftriaxone Zithromax  DVT prophylaxis: Eliquis   Objective: Vitals:   06/20/21 0001 06/20/21 0041 06/20/21 0756 06/20/21 1202  BP: 125/63  127/70 114/67  Pulse: 88  90 79   Resp: 19  20 18   Temp:   97.6 F (36.4 C) (!) 97.5 F (36.4 C)  TempSrc:      SpO2: 96% 98% 99% 91%  Weight:      Height:        Intake/Output Summary (Last 24 hours) at 06/20/2021 1219 Last data filed at 06/20/2021 1058 Gross per 24 hour  Intake 328.48 ml  Output 2475 ml  Net -2146.52 ml    Filed Weights   06/16/21 2329 06/18/21 0500 06/19/21 0657  Weight: 128.5 kg 131.5 kg 131.1 kg   Body mass index is 37.11 kg/m.  Exam:  General.  Well-developed, obese gentleman, in no acute distress. Pulmonary.  Lungs clear bilaterally, normal respiratory effort. CV.  Regular rate and rhythm, no JVD, rub or murmur. Abdomen.  Soft, nontender, nondistended, BS positive. CNS.  Alert and oriented .  No focal neurologic deficit. Extremities.  No edema, no cyanosis, pulses intact and symmetrical. Psychiatry.  Judgment and insight appears normal.    Data Reviewed: CBC: Recent Labs  Lab 06/14/21 0436 06/16/21 0428 06/17/21 0924 06/17/21 2047 06/18/21 0414  WBC 14.1* 15.6* 11.8* 15.5* 14.2*  NEUTROABS  --   --   --  13.8*  --   HGB 11.8* 11.6* 12.1* 12.1* 11.1*  HCT 38.0* 37.3* 38.3* 38.6* 35.9*  MCV 104.1* 104.8* 105.2* 104.0* 103.8*  PLT 444* 390 335 354 299    Basic Metabolic Panel: Recent Labs  Lab 06/15/21 0509 06/15/21 0842 06/16/21 0428 06/17/21 0924 06/17/21 2041 06/18/21 0414 06/19/21 0650 06/20/21 0430  NA 133*  --  132* 134* 133* 134* 135 138  K 5.2*   < > 5.0 6.1* 5.4* 5.8* 5.2* 6.2*  CL 102  --  102 103 100 103 103 109  CO2 22  --  22 22 21* 22 25 25   GLUCOSE 131*  --  119* 148* 159* 111* 98 119*  BUN 90*  --  97* 97* 87* 104* 100* 99*  CREATININE 3.71*  --  3.58* 3.12* 3.12* 2.91* 3.00* 3.18*  CALCIUM 8.8*  --  8.5* 8.6* 8.5* 8.3* 8.3* 9.2  MG 2.4  --  2.3 2.8*  --  2.4  --   --   PHOS 4.8*  --  4.8* 5.2*  --  4.4  --   --    < > = values in this interval not displayed.    GFR: Estimated Creatinine Clearance: 30.7 mL/min (A) (by C-G formula based  on SCr of 3.18 mg/dL (H)). Liver Function Tests: No results for input(s): AST, ALT, ALKPHOS, BILITOT, PROT, ALBUMIN in the last 168 hours.  No results for input(s): LIPASE, AMYLASE in the last 168 hours. No results for input(s): AMMONIA in the last 168 hours. Coagulation Profile: No results for input(s): INR, PROTIME in the last 168 hours. Cardiac Enzymes: No results for input(s): CKTOTAL, CKMB, CKMBINDEX, TROPONINI in the last 168 hours. BNP (last 3 results) No results for input(s): PROBNP in the last 8760 hours. HbA1C: No results for input(s): HGBA1C in the last 72 hours. CBG: Recent Labs  Lab 06/19/21 1134 06/19/21 1617  06/19/21 2141 06/20/21 0821 06/20/21 1154  GLUCAP 101* 158* 138* 82 155*    Lipid Profile: No results for input(s): CHOL, HDL, LDLCALC, TRIG, CHOLHDL, LDLDIRECT in the last 72 hours. Thyroid Function Tests: No results for input(s): TSH, T4TOTAL, FREET4, T3FREE, THYROIDAB in the last 72 hours. Anemia Panel: No results for input(s): VITAMINB12, FOLATE, FERRITIN, TIBC, IRON, RETICCTPCT in the last 72 hours. Urine analysis:    Component Value Date/Time   COLORURINE YELLOW (A) 06/13/2021 0319   APPEARANCEUR HAZY (A) 06/13/2021 0319   LABSPEC 1.010 06/13/2021 0319   PHURINE 5.0 06/13/2021 0319   GLUCOSEU NEGATIVE 06/13/2021 0319   HGBUR SMALL (A) 06/13/2021 0319   BILIRUBINUR NEGATIVE 06/13/2021 0319   KETONESUR NEGATIVE 06/13/2021 0319   PROTEINUR NEGATIVE 06/13/2021 0319   NITRITE NEGATIVE 06/13/2021 0319   LEUKOCYTESUR TRACE (A) 06/13/2021 0319   Sepsis Labs: @LABRCNTIP (procalcitonin:4,lacticidven:4)  ) Recent Results (from the past 240 hour(s))  Resp Panel by RT-PCR (Flu A&B, Covid) Nasopharyngeal Swab     Status: None   Collection Time: 06/12/21  6:35 PM   Specimen: Nasopharyngeal Swab; Nasopharyngeal(NP) swabs in vial transport medium  Result Value Ref Range Status   SARS Coronavirus 2 by RT PCR NEGATIVE NEGATIVE Final    Comment:  (NOTE) SARS-CoV-2 target nucleic acids are NOT DETECTED.  The SARS-CoV-2 RNA is generally detectable in upper respiratory specimens during the acute phase of infection. The lowest concentration of SARS-CoV-2 viral copies this assay can detect is 138 copies/mL. A negative result does not preclude SARS-Cov-2 infection and should not be used as the sole basis for treatment or other patient management decisions. A negative result may occur with  improper specimen collection/handling, submission of specimen other than nasopharyngeal swab, presence of viral mutation(s) within the areas targeted by this assay, and inadequate number of viral copies(<138 copies/mL). A negative result must be combined with clinical observations, patient history, and epidemiological information. The expected result is Negative.  Fact Sheet for Patients:  EntrepreneurPulse.com.au  Fact Sheet for Healthcare Providers:  IncredibleEmployment.be  This test is no t yet approved or cleared by the [place] FDA and  has been authorized for detection and/or diagnosis of SARS-CoV-2 by FDA under an Emergency Use Authorization (EUA). This EUA will remain  in effect (meaning this test can be used) for the duration of the COVID-19 declaration under Section 564(b)(1) of the Act, 21 U.S.C.section 360bbb-3(b)(1), unless the authorization is terminated  or revoked sooner.       Influenza A by PCR NEGATIVE NEGATIVE Final   Influenza B by PCR NEGATIVE NEGATIVE Final    Comment: (NOTE) The Xpert Xpress SARS-CoV-2/FLU/RSV plus assay is intended as an aid in the diagnosis of influenza from Nasopharyngeal swab specimens and should not be used as a sole basis for treatment. Nasal washings and aspirates are unacceptable for Xpert Xpress SARS-CoV-2/FLU/RSV testing.  Fact Sheet for Patients: EntrepreneurPulse.com.au  Fact Sheet for Healthcare  Providers: IncredibleEmployment.be  This test is not yet approved or cleared by the [place] FDA and has been authorized for detection and/or diagnosis of SARS-CoV-2 by FDA under an Emergency Use Authorization (EUA). This EUA will remain in effect (meaning this test can be used) for the duration of the COVID-19 declaration under Section 564(b)(1) of the Act, 21 U.S.C. section 360bbb-3(b)(1), unless the authorization is terminated or revoked.  Performed at [facility], [address] [place], [place] [address]   Blood culture (routine x 2)     Status: None   Collection  Time: 06/12/21  7:48 PM   Specimen: BLOOD  Result Value Ref Range Status   Specimen Description BLOOD RAC  Final   Special Requests   Final    BOTTLES DRAWN AEROBIC AND ANAEROBIC Blood Culture results may not be optimal due to an inadequate volume of blood received in culture bottles   Culture   Final    NO GROWTH 5 DAYS Performed at [facility], [address] [place], [place] [address]    Report Status 06/17/2021 FINAL  Final  Blood culture (routine x 2)     Status: None   Collection Time: 06/12/21  7:48 PM   Specimen: BLOOD  Result Value Ref Range Status   Specimen Description BLOOD BLOOD LEFT HAND  Final   Special Requests   Final    BOTTLES DRAWN AEROBIC AND ANAEROBIC Blood Culture results may not be optimal due to an inadequate volume of blood received in culture bottles   Culture   Final    NO GROWTH 5 DAYS Performed at [facility], [address], [place], [place] [address]    Report Status 06/17/2021 FINAL  Final      Studies: No results found.  Scheduled Meds:  apixaban  5 mg Oral BID   aspirin EC  81 mg Oral Daily   azithromycin  250 mg Oral Daily   colchicine  0.3 mg Oral Daily   diltiazem  300 mg Oral Daily   docusate sodium  100 mg Oral BID   famotidine  20 mg Oral Daily   hydrALAZINE  10 mg Oral Q8H   insulin aspart  0-5 Units  Subcutaneous QHS   insulin aspart  0-9 Units Subcutaneous TID WC   melatonin  5 mg Oral QHS   methylPREDNISolone (SOLU-MEDROL) injection  40 mg Intravenous Daily   mometasone-formoterol  2 puff Inhalation BID   multivitamin with minerals  1 tablet Oral Daily   neomycin-bacitracin-polymyxin   Topical BID   sodium chloride flush  3 mL Intravenous Q12H   tamsulosin  0.4 mg Oral QPC supper    Continuous Infusions:  cefTRIAXone (ROCEPHIN)  IV 1 g (06/19/21 2215)     LOS: 8 days     Lorella Nimrod, MD [facility]  To reach me or the doctor on call, go to: [email or web address] Password [facility]  06/20/2021, 12:19 PM

## 2021-06-20 NOTE — TOC Progression Note (Signed)
Transition of Care [facility]) - Progression Note    Patient Details  Name: Matthew [name] MRN: [id number] Date of Birth: Dec 15, 1950  Transition of [facility]) CM/SW Contact  Eileen Stanford, LCSW Phone Number: 06/20/2021, 3:04 PM  Clinical Narrative:   CSW spoke wih pt's spouse and she states out of all the bed offers she would only be able to drive to [facility]. CSW relayed this information to the patient and patient is agreeable to [facility]. CSW has reached out to [facility] and they have started [place].    Expected Discharge Plan: [place] Barriers to Discharge: Continued Medical Work up  Expected Discharge Plan and Services Expected Discharge Plan: [place] In-house Referral: Clinical Social Work   Post Acute Care Choice: [place] Living arrangements for the past 2 months: Single Family Home                                       Social Determinants of Health (SDOH) Interventions    Readmission Risk Interventions No flowsheet data found.

## 2021-06-21 LAB — RENAL FUNCTION PANEL
Albumin: 2.4 g/dL — ABNORMAL LOW (ref 3.5–5.0)
Anion gap: 10 (ref 5–15)
BUN: 90 mg/dL — ABNORMAL HIGH (ref 8–23)
CO2: 24 mmol/L (ref 22–32)
Calcium: 8.5 mg/dL — ABNORMAL LOW (ref 8.9–10.3)
Chloride: 101 mmol/L (ref 98–111)
Creatinine, Ser: 3.11 mg/dL — ABNORMAL HIGH (ref 0.61–1.24)
GFR, Estimated: 21 mL/min — ABNORMAL LOW (ref >60)
Glucose, Bld: 109 mg/dL — ABNORMAL HIGH (ref 70–99)
Phosphorus: 5.2 mg/dL — ABNORMAL HIGH (ref 2.5–4.6)
Potassium: 4.6 mmol/L (ref 3.5–5.1)
Sodium: 135 mmol/L (ref 135–145)

## 2021-06-21 LAB — GLUCOSE, CAPILLARY
Glucose-Capillary: 96 mg/dL (ref 70–99)
Glucose-Capillary: 129 mg/dL — ABNORMAL HIGH (ref 70–99)
Glucose-Capillary: 165 mg/dL — ABNORMAL HIGH (ref 70–99)
Glucose-Capillary: 142 mg/dL — ABNORMAL HIGH (ref 70–99)

## 2021-06-21 MED ORDER — METOPROLOL TARTRATE 25 MG PO TABS
25.0000 mg | ORAL_TABLET | Freq: Two times a day (BID) | ORAL | Status: DC
  Administered 2021-06-21 – 2021-06-23 (×5): 25 mg via ORAL
  Filled 2021-06-21 (×5): qty 1

## 2021-06-21 NOTE — Progress Notes (Signed)
Physical Therapy Treatment Patient Details Name: Matthew [name] MRN: [id number] DOB: 01-21-1951 Today's Date: 06/21/2021   History of Present Illness Pt admitted for complaints of cough and SOB symptoms. HIstory includes HTN, asthma, COPD, and obesity.    PT Comments    Pt received laying supine in bed and agreeable to PT treatment. Pt potassium has improved and appropriate for treatment. Pt demonstrating generalized deconditioning and reporting increased shortness of breath during exertional activities. Attempted to obtain oxygen saturation on RA during exertional activities but pulse oximeter inconsistent throughout. Pt was noted to drop to 85% on RA and replaced with 2L oxygen. Pt able to take side steps at the EOB with RW but requesting to sit due to shortness of breath and B knee pain. Pt cued for PLB techniques to improve oxygenation and reduce respiratory rate. Pt returned to supine and repositioned with SPV. Pt left on 2L oxygen at 94% SpO2 with all needs within reach. SNF recommendations continue to be appropriate at this time.   Recommendations for follow up therapy are one component of a multi-disciplinary discharge planning process, led by the attending physician.  Recommendations may be updated based on patient status, additional functional criteria and insurance authorization.  Follow Up Recommendations  Skilled nursing-short term rehab (<3 hours/day)     Assistance Recommended at Discharge Intermittent Supervision/Assistance  Equipment Recommendations  Other (comment) (TBD next venue of care)    Recommendations for Other Services       Precautions / Restrictions Precautions Precautions: Fall Restrictions Weight Bearing Restrictions: No     Mobility  Bed Mobility Overal bed mobility: Needs Assistance Bed Mobility: Supine to Sit;Sit to Supine     Supine to sit: Supervision Sit to supine: Supervision   General bed mobility comments: Increased time and verbal cues  to complete bed mobility with HOB slightly elevated    Transfers Overall transfer level: Needs assistance Equipment used: Rolling walker (2 wheels) Transfers: Sit to/from Stand Sit to Stand: Min assist;From elevated surface           General transfer comment: MinA from elevated surface; VCs for improved hand placement. Pt with very poor activity tolerance and increased shortness of breath    Ambulation/Gait Ambulation/Gait assistance: Min guard Gait Distance (Feet): 3 Feet Assistive device: Rolling walker (2 wheels) Gait Pattern/deviations: Step-to pattern       General Gait Details: Pt took sidesteps at EOB with RW + CGA for safety. Pt with increased fatigue quickly requesting to sit.   Stairs             Wheelchair Mobility    Modified Rankin (Stroke Patients Only)       Balance Overall balance assessment: Needs assistance;History of Falls Sitting-balance support: Bilateral upper extremity supported;Feet supported Sitting balance-Leahy Scale: Good     Standing balance support: Bilateral upper extremity supported;Reliant on assistive device for balance Standing balance-Leahy Scale: Fair Standing balance comment: Heavy UE usage on AD and increased shortness of breath with exertion                            Cognition Arousal/Alertness: Awake/alert Behavior During Therapy: WFL for tasks assessed/performed Overall Cognitive Status: Within Functional Limits for tasks assessed                                          Exercises  General Exercises - Lower Extremity Ankle Circles/Pumps: AROM;Strengthening;Both;20 reps;Supine Long Arc Quad: AROM;Strengthening;Both;20 reps;Seated Heel Slides: AROM;Strengthening;Both;20 reps;Supine    General Comments General comments (skin integrity, edema, etc.): Attempted to monitor oxygen saturation on RA with exertional activites but difficulties with maintaining a consistent reading. Pt  observed to drop down to 85% on RA and replaced with 2L and cued for pursed lip breathing to reduce respiratory rate      Pertinent Vitals/Pain Pain Assessment: No/denies pain    Home Living                          Prior Function            PT Goals (current goals can now be found in the care plan section) Acute Rehab PT Goals Patient Stated Goal: to get stronger PT Goal Formulation: With patient Time For Goal Achievement: 06/27/21 Potential to Achieve Goals: Good Progress towards PT goals: Progressing toward goals    Frequency    Min 2X/week      PT Plan Current plan remains appropriate    Co-evaluation              AM-PAC PT "6 Clicks" Mobility   Outcome Measure  Help needed turning from your back to your side while in a flat bed without using bedrails?: None Help needed moving from lying on your back to sitting on the side of a flat bed without using bedrails?: A Little Help needed moving to and from a bed to a chair (including a wheelchair)?: A Lot Help needed standing up from a chair using your arms (e.g., wheelchair or bedside chair)?: A Little Help needed to walk in hospital room?: A Lot Help needed climbing 3-5 steps with a railing? : Total 6 Click Score: 15    End of Session Equipment Utilized During Treatment: Gait belt;Oxygen Activity Tolerance: Patient limited by fatigue Patient left: in bed;with call bell/phone within reach;with bed alarm set Nurse Communication: Mobility status PT Visit Diagnosis: Muscle weakness (generalized) (M62.81);History of falling (Z91.81);Difficulty in walking, not elsewhere classified (R26.2)     Time: 0802-2336 PT Time Calculation (min) (ACUTE ONLY): 30 min  Charges:  $Gait Training: 8-22 mins $Therapeutic Exercise: 8-22 mins                     Andrey Campanile, SPT    Andrey Campanile 06/21/2021, 1:16 PM

## 2021-06-21 NOTE — Progress Notes (Signed)
[facility]  ROUNDING NOTE   Subjective:   Patient seen sitting up in bed, alert Tolerating meals without nausea and vomiting Denies shortness of breath Creatinine with minimal improvement Urine output of 1.5 L on day shift yesterday  Objective:  Vital signs in last 24 hours:  Temp:  [97.4 F (36.3 C)-97.7 F (36.5 C)] 97.5 F (36.4 C) (11/08 0758) Pulse Rate:  [45-91] 62 (11/08 0758) Resp:  [16-18] 16 (11/08 0758) BP: (102-130)/(67-104) 121/104 (11/08 0758) SpO2:  [91 %-100 %] 92 % (11/08 0758) Weight:  [133.6 kg] 133.6 kg (11/08 0340)  Weight change:  Filed Weights   06/18/21 0500 06/19/21 0657 06/21/21 0340  Weight: 131.5 kg 131.1 kg 133.6 kg    Intake/Output: I/O last 3 completed shifts: In: 698.2 [P.O.:240; I.V.:129.7; IV Piggyback:328.5] Out: 2575 [Urine:2575]   Intake/Output this shift:  Total I/O In: 1094.8 [P.O.:240; I.V.:754.8; IV Piggyback:100] Out: 825 [Urine:825]  Physical Exam: General: NAD  Head: Normocephalic, atraumatic. Moist oral mucosal membranes  Eyes: Anicteric  Lungs:  Clear to auscultation, normal effort  Heart: Regular rate and rhythm  Abdomen:  Soft, nontender, obese  Extremities:  no peripheral edema.  Neurologic: Nonfocal, moving all four extremities  Skin: No lesions       Basic Metabolic Panel: Recent Labs  Lab 06/15/21 0509 06/15/21 0842 06/16/21 0428 06/17/21 0924 06/17/21 2041 06/18/21 0414 06/19/21 0650 06/20/21 0430 06/21/21 0903  NA 133*  --  132* 134* 133* 134* 135 138 135  K 5.2*   < > 5.0 6.1* 5.4* 5.8* 5.2* 6.2* 4.6  CL 102  --  102 103 100 103 103 109 101  CO2 22  --  22 22 21* 22 25 25 24   GLUCOSE 131*  --  119* 148* 159* 111* 98 119* 109*  BUN 90*  --  97* 97* 87* 104* 100* 99* 90*  CREATININE 3.71*  --  3.58* 3.12* 3.12* 2.91* 3.00* 3.18* 3.11*  CALCIUM 8.8*  --  8.5* 8.6* 8.5* 8.3* 8.3* 9.2 8.5*  MG 2.4  --  2.3 2.8*  --  2.4  --   --   --   PHOS 4.8*  --  4.8* 5.2*  --  4.4  --   --   5.2*   < > = values in this interval not displayed.     Liver Function Tests: Recent Labs  Lab 06/21/21 0903  ALBUMIN 2.4*    No results for input(s): LIPASE, AMYLASE in the last 168 hours. No results for input(s): AMMONIA in the last 168 hours.  CBC: Recent Labs  Lab 06/16/21 0428 06/17/21 0924 06/17/21 2047 06/18/21 0414  WBC 15.6* 11.8* 15.5* 14.2*  NEUTROABS  --   --  13.8*  --   HGB 11.6* 12.1* 12.1* 11.1*  HCT 37.3* 38.3* 38.6* 35.9*  MCV 104.8* 105.2* 104.0* 103.8*  PLT 390 335 354 295     Cardiac Enzymes: No results for input(s): CKTOTAL, CKMB, CKMBINDEX, TROPONINI in the last 168 hours.  BNP: Invalid input(s): POCBNP  CBG: Recent Labs  Lab 06/20/21 0821 06/20/21 1154 06/20/21 1533 06/20/21 2047 06/21/21 0800  GLUCAP 82 155* 163* 219* 96     Microbiology: Results for orders placed or performed during the hospital encounter of 06/12/21  Resp Panel by RT-PCR (Flu A&B, Covid) Nasopharyngeal Swab     Status: None   Collection Time: 06/12/21  6:35 PM   Specimen: Nasopharyngeal Swab; Nasopharyngeal(NP) swabs in vial transport medium  Result Value Ref Range Status  SARS Coronavirus 2 by RT PCR NEGATIVE NEGATIVE Final    Comment: (NOTE) SARS-CoV-2 target nucleic acids are NOT DETECTED.  The SARS-CoV-2 RNA is generally detectable in upper respiratory specimens during the acute phase of infection. The lowest concentration of SARS-CoV-2 viral copies this assay can detect is 138 copies/mL. A negative result does not preclude SARS-Cov-2 infection and should not be used as the sole basis for treatment or other patient management decisions. A negative result may occur with  improper specimen collection/handling, submission of specimen other than nasopharyngeal swab, presence of viral mutation(s) within the areas targeted by this assay, and inadequate number of viral copies(<138 copies/mL). A negative result must be combined with clinical observations,  patient history, and epidemiological information. The expected result is Negative.  Fact Sheet for Patients:  EntrepreneurPulse.com.au  Fact Sheet for Healthcare Providers:  IncredibleEmployment.be  This test is no t yet approved or cleared by the [place] FDA and  has been authorized for detection and/or diagnosis of SARS-CoV-2 by FDA under an Emergency Use Authorization (EUA). This EUA will remain  in effect (meaning this test can be used) for the duration of the COVID-19 declaration under Section 564(b)(1) of the Act, 21 U.S.C.section 360bbb-3(b)(1), unless the authorization is terminated  or revoked sooner.       Influenza A by PCR NEGATIVE NEGATIVE Final   Influenza B by PCR NEGATIVE NEGATIVE Final    Comment: (NOTE) The Xpert Xpress SARS-CoV-2/FLU/RSV plus assay is intended as an aid in the diagnosis of influenza from Nasopharyngeal swab specimens and should not be used as a sole basis for treatment. Nasal washings and aspirates are unacceptable for Xpert Xpress SARS-CoV-2/FLU/RSV testing.  Fact Sheet for Patients: EntrepreneurPulse.com.au  Fact Sheet for Healthcare Providers: IncredibleEmployment.be  This test is not yet approved or cleared by the [place] FDA and has been authorized for detection and/or diagnosis of SARS-CoV-2 by FDA under an Emergency Use Authorization (EUA). This EUA will remain in effect (meaning this test can be used) for the duration of the COVID-19 declaration under Section 564(b)(1) of the Act, 21 U.S.C. section 360bbb-3(b)(1), unless the authorization is terminated or revoked.  Performed at [facility], [address] [place], [place] [address]   Blood culture (routine x 2)     Status: None   Collection Time: 06/12/21  7:48 PM   Specimen: BLOOD  Result Value Ref Range Status   Specimen Description BLOOD RAC  Final   Special Requests   Final     BOTTLES DRAWN AEROBIC AND ANAEROBIC Blood Culture results may not be optimal due to an inadequate volume of blood received in culture bottles   Culture   Final    NO GROWTH 5 DAYS Performed at [facility], [address] [place], [place] [address]    Report Status 06/17/2021 FINAL  Final  Blood culture (routine x 2)     Status: None   Collection Time: 06/12/21  7:48 PM   Specimen: BLOOD  Result Value Ref Range Status   Specimen Description BLOOD BLOOD LEFT HAND  Final   Special Requests   Final    BOTTLES DRAWN AEROBIC AND ANAEROBIC Blood Culture results may not be optimal due to an inadequate volume of blood received in culture bottles   Culture   Final    NO GROWTH 5 DAYS Performed at [facility], [address], [place], [place] [address]    Report Status 06/17/2021 FINAL  Final    Coagulation Studies:  No results for input(s): LABPROT, INR in the last 72 hours.  Urinalysis: No results for input(s): COLORURINE, LABSPEC, PHURINE, GLUCOSEU, HGBUR, BILIRUBINUR, KETONESUR, PROTEINUR, UROBILINOGEN, NITRITE, LEUKOCYTESUR in the last 72 hours.  Invalid input(s): APPERANCEUR    Imaging: No results found.   Medications:    sodium chloride 50 mL/hr at 06/21/21 0708   cefTRIAXone (ROCEPHIN)  IV Stopped (06/20/21 2235)    apixaban  5 mg Oral BID   aspirin EC  81 mg Oral Daily   azithromycin  250 mg Oral Daily   colchicine  0.3 mg Oral Daily   diltiazem  300 mg Oral Daily   docusate sodium  100 mg Oral BID   famotidine  20 mg Oral Daily   hydrALAZINE  10 mg Oral Q8H   insulin aspart  0-5 Units Subcutaneous QHS   insulin aspart  0-9 Units Subcutaneous TID WC   melatonin  5 mg Oral QHS   mometasone-formoterol  2 puff Inhalation BID   multivitamin with minerals  1 tablet Oral Daily   neomycin-bacitracin-polymyxin   Topical BID   predniSONE  50 mg Oral Q breakfast   sodium chloride flush  3 mL Intravenous Q12H   tamsulosin  0.4 mg Oral QPC supper    acetaminophen **OR** acetaminophen, albuterol, bisacodyl, hydrALAZINE, ondansetron (ZOFRAN) IV, oxyCODONE, polyethylene glycol  Assessment/ Plan:  Matthew [name] is a 70 y.o.  male with medical problems of hypertension, atrial fibrillation, chronic edema, LVH,   was admitted on 06/12/2021 for :SOB (shortness of breath) [R06.02] Bilateral leg edema [R60.0] Acute respiratory failure with hypoxia ([place]) [J96.01] Atrial fibrillation with RVR ([place]) [I48.91]   Acute kidney injury on chronic kidney disease stage IIIb Baseline creatinine 1.81/GFR 40 from December 04, 2020 The cause for AKI is not entirely clear but possibly related to hemodynamic instability from A. fib and RVR. Urinalysis at admission appears benign Renal imaging negative for obstruction.  Creatinine with minimal improvement overnight with initiation of IV fluids.  Recommend continuing IV fluids at this time and monitoring renal function.   Agree with holding lisinopril and meloxicam. Avoid nonsteroidals, IV contrast, hypotension Will require discharge follow up with our office.    2. Chronic lower extremity edema No history of prior diuretic use   3. Hyperkalemia/hyponatremia Likely from AKI. Sodium 135  Potassium improved 4.6 after Veltassa dosing yesterday    4. Hesitancy of urination Resolved with tamsulosin    LOS: 9   11/8/202211:01 AM

## 2021-06-21 NOTE — Progress Notes (Signed)
PROGRESS NOTE  Matthew [name] EHU:[id number] DOB: [birth year] DOA: 06/12/2021 PCP: [name], MD  HPI/Recap of past 24 hours: Brief Narrative:   This is a 70 year old male with past medical history significant for hypertension, asthma, atrial fibrillation, he is on Eliquis for this, CKD stage IIIb, vitamin B12 deficiency, morbid obesity, presenting with acute shortness of breath to the emergency department.  Patient was initially started on steroids and IV Lasix he was also found to have A. fib with rapid ventricular rate.  Patient was admitted for asthma exacerbation and atrial fibrillation with rapid ventricular rate his serum creatinine was noted to be worsening and nephrology was consulted. Heart rate currently improved.  Worsening potassium and renal function. PT is recommending SNF.  11/8: Renal function now stable, hyperkalemia resolved.  Continue to have intermittent tachycardia. TOC is working on placement.  Subjective: Patient was seen and examined today.  No new complaints.  Assessment/Plan: Principal Problem:   SOB (shortness of breath) Active Problems:   Moderate persistent asthma with exacerbation   Essential hypertension   COPD with acute exacerbation (HCC)   Edema of both legs   Atrial fibrillation with RVR (HCC)   Acute kidney injury superimposed on chronic kidney disease (HCC)   Elevated glucose level   Obesity (BMI 30-39.9)   Macrocytosis   Acute respiratory failure with hypoxia (HCC)   Acute gout of right ankle   Asthma exacerbation: Resolved. His BNP was 279 but no edema and EF was 50-55 by echo.  He was started on antibiotics for concern of CAP, no radiologic infiltrate but procalcitonin was elevated -Switch Solu-Medrol with prednisone for another 3 more days. -Continue with ceftriaxone and Zithromax to complete a 5-day course -Continue with bronchodilators -Continue with supportive care -Weaned off from oxygen as tolerated   Atrial fibrillation  with RVR: Intermittently having tachycardia with heart rate up to 130s. -Add low-dose metoprolol at 25 mg twice daily-can titrate as needed -Continue Cardizem ER 300 mg -Continue Eliquis for anticoagulation   Acute kidney injury on CKD stage IIIb: Renal function now stable.  Renal ultrasound without any hydronephrosis but did show medical renal disease.  Initial concern of some worsening with Lasix which is now on hold. Nephrology on board-appreciate their help, no need for emergent dialysis. Continue to monitor creatinine for improvement avoid nephrotoxic drugs   Hyperkalemia: Resolved with Veltassa. Likely related to AKI on CKD -Monitor potassium  Right foot pain: Improving likely due to gout his uric acid is elevated Will continue prednisone and colchicine   Essential hypertension: Continue Cardizem and hydralazine   Morbid obesity. Estimated body mass index is 37.82 kg/m as calculated from the following:   Height as of this encounter: 6\' 2"  (1.88 m).   Weight as of this encounter: 133.6 kg.  Patient will benefit with weight loss program Recommend outpatient sleep study   Hyperglycemia: Recent hemoglobin A1c is 5.9 Continue sliding scale   Leukocytosis: Could be secondary to steroid Is trending down  Big ulcer in the posterior right earlobe which he stated has been there since July 2022 he stated he was in the sun exposure that he thinks he may have had too much sun and he got burned.  Patient advised will need to see a dermatologist as outpatient I will order Neosporin to apply to the ulcer twice daily   Code Status: Full  Severity of Illness:   Patient is inpatient stay due to continued mild medication management.  Awaiting placement. Family Communication: None at  bedside  Disposition Plan:   Status is: Inpatient   Dispo: The patient is from: Home              Anticipated d/c is to: SNF              Anticipated d/c date is:               Patient currently  medically stable for discharge  Consultants: Nephrology  Procedures: None  Antimicrobials: Ceftriaxone Zithromax  DVT prophylaxis: Eliquis   Objective: Vitals:   06/20/21 2302 06/21/21 0340 06/21/21 0758 06/21/21 1118  BP: 126/75 102/74 (!) 121/104 114/70  Pulse: 80 (!) 45 62 (!) 103  Resp: 16 18 16 19   Temp: (!) 97.5 F (36.4 C) 97.7 F (36.5 C) (!) 97.5 F (36.4 C) 98.3 F (36.8 C)  TempSrc:  Oral    SpO2: 100% 99% 92% 99%  Weight:  133.6 kg    Height:        Intake/Output Summary (Last 24 hours) at 06/21/2021 1525 Last data filed at 06/21/2021 1122 Gross per 24 hour  Intake 1224.51 ml  Output 1975 ml  Net -750.49 ml    Filed Weights   06/18/21 0500 06/19/21 0657 06/21/21 0340  Weight: 131.5 kg 131.1 kg 133.6 kg   Body mass index is 37.82 kg/m.  Exam:  General.  Obese gentleman, in no acute distress. Pulmonary.  Lungs clear bilaterally, normal respiratory effort. CV.  Regular rate and rhythm, no JVD, rub or murmur. Abdomen.  Soft, nontender, nondistended, BS positive. CNS.  Alert and oriented .  No focal neurologic deficit. Extremities.  Trace LE edema, no cyanosis, pulses intact and symmetrical. Psychiatry.  Judgment and insight appears normal.   Data Reviewed: CBC: Recent Labs  Lab 06/16/21 0428 06/17/21 0924 06/17/21 2047 06/18/21 0414  WBC 15.6* 11.8* 15.5* 14.2*  NEUTROABS  --   --  13.8*  --   HGB 11.6* 12.1* 12.1* 11.1*  HCT 37.3* 38.3* 38.6* 35.9*  MCV 104.8* 105.2* 104.0* 103.8*  PLT 390 335 354 132    Basic Metabolic Panel: Recent Labs  Lab 06/15/21 0509 06/15/21 0842 06/16/21 0428 06/17/21 0924 06/17/21 2041 06/18/21 0414 06/19/21 0650 06/20/21 0430 06/21/21 0903  NA 133*  --  132* 134* 133* 134* 135 138 135  K 5.2*   < > 5.0 6.1* 5.4* 5.8* 5.2* 6.2* 4.6  CL 102  --  102 103 100 103 103 109 101  CO2 22  --  22 22 21* 22 25 25 24   GLUCOSE 131*  --  119* 148* 159* 111* 98 119* 109*  BUN 90*  --  97* 97* 87* 104* 100*  99* 90*  CREATININE 3.71*  --  3.58* 3.12* 3.12* 2.91* 3.00* 3.18* 3.11*  CALCIUM 8.8*  --  8.5* 8.6* 8.5* 8.3* 8.3* 9.2 8.5*  MG 2.4  --  2.3 2.8*  --  2.4  --   --   --   PHOS 4.8*  --  4.8* 5.2*  --  4.4  --   --  5.2*   < > = values in this interval not displayed.    GFR: Estimated Creatinine Clearance: 31.7 mL/min (A) (by C-G formula based on SCr of 3.11 mg/dL (H)). Liver Function Tests: Recent Labs  Lab 06/21/21 0903  ALBUMIN 2.4*    No results for input(s): LIPASE, AMYLASE in the last 168 hours. No results for input(s): AMMONIA in the last 168 hours. Coagulation Profile: No results for  input(s): INR, PROTIME in the last 168 hours. Cardiac Enzymes: No results for input(s): CKTOTAL, CKMB, CKMBINDEX, TROPONINI in the last 168 hours. BNP (last 3 results) No results for input(s): PROBNP in the last 8760 hours. HbA1C: No results for input(s): HGBA1C in the last 72 hours. CBG: Recent Labs  Lab 06/20/21 1154 06/20/21 1533 06/20/21 2047 06/21/21 0800 06/21/21 1119  GLUCAP 155* 163* 219* 96 129*    Lipid Profile: No results for input(s): CHOL, HDL, LDLCALC, TRIG, CHOLHDL, LDLDIRECT in the last 72 hours. Thyroid Function Tests: No results for input(s): TSH, T4TOTAL, FREET4, T3FREE, THYROIDAB in the last 72 hours. Anemia Panel: No results for input(s): VITAMINB12, FOLATE, FERRITIN, TIBC, IRON, RETICCTPCT in the last 72 hours. Urine analysis:    Component Value Date/Time   COLORURINE YELLOW (A) 06/13/2021 0319   APPEARANCEUR HAZY (A) 06/13/2021 0319   LABSPEC 1.010 06/13/2021 0319   PHURINE 5.0 06/13/2021 0319   GLUCOSEU NEGATIVE 06/13/2021 0319   HGBUR SMALL (A) 06/13/2021 0319   BILIRUBINUR NEGATIVE 06/13/2021 0319   KETONESUR NEGATIVE 06/13/2021 0319   PROTEINUR NEGATIVE 06/13/2021 0319   NITRITE NEGATIVE 06/13/2021 0319   LEUKOCYTESUR TRACE (A) 06/13/2021 0319   Sepsis Labs: @LABRCNTIP (procalcitonin:4,lacticidven:4)  ) Recent Results (from the past 240  hour(s))  Resp Panel by RT-PCR (Flu A&B, Covid) Nasopharyngeal Swab     Status: None   Collection Time: 06/12/21  6:35 PM   Specimen: Nasopharyngeal Swab; Nasopharyngeal(NP) swabs in vial transport medium  Result Value Ref Range Status   SARS Coronavirus 2 by RT PCR NEGATIVE NEGATIVE Final    Comment: (NOTE) SARS-CoV-2 target nucleic acids are NOT DETECTED.  The SARS-CoV-2 RNA is generally detectable in upper respiratory specimens during the acute phase of infection. The lowest concentration of SARS-CoV-2 viral copies this assay can detect is 138 copies/mL. A negative result does not preclude SARS-Cov-2 infection and should not be used as the sole basis for treatment or other patient management decisions. A negative result may occur with  improper specimen collection/handling, submission of specimen other than nasopharyngeal swab, presence of viral mutation(s) within the areas targeted by this assay, and inadequate number of viral copies(<138 copies/mL). A negative result must be combined with clinical observations, patient history, and epidemiological information. The expected result is Negative.  Fact Sheet for Patients:  EntrepreneurPulse.com.au  Fact Sheet for Healthcare Providers:  IncredibleEmployment.be  This test is no t yet approved or cleared by the [place] FDA and  has been authorized for detection and/or diagnosis of SARS-CoV-2 by FDA under an Emergency Use Authorization (EUA). This EUA will remain  in effect (meaning this test can be used) for the duration of the COVID-19 declaration under Section 564(b)(1) of the Act, 21 U.S.C.section 360bbb-3(b)(1), unless the authorization is terminated  or revoked sooner.       Influenza A by PCR NEGATIVE NEGATIVE Final   Influenza B by PCR NEGATIVE NEGATIVE Final    Comment: (NOTE) The Xpert Xpress SARS-CoV-2/FLU/RSV plus assay is intended as an aid in the diagnosis of influenza from  Nasopharyngeal swab specimens and should not be used as a sole basis for treatment. Nasal washings and aspirates are unacceptable for Xpert Xpress SARS-CoV-2/FLU/RSV testing.  Fact Sheet for Patients: EntrepreneurPulse.com.au  Fact Sheet for Healthcare Providers: IncredibleEmployment.be  This test is not yet approved or cleared by the [place] FDA and has been authorized for detection and/or diagnosis of SARS-CoV-2 by FDA under an Emergency Use Authorization (EUA). This EUA will remain in effect (meaning this  test can be used) for the duration of the COVID-19 declaration under Section 564(b)(1) of the Act, 21 U.S.C. section 360bbb-3(b)(1), unless the authorization is terminated or revoked.  Performed at [facility], [address] [place], [place] [address]   Blood culture (routine x 2)     Status: None   Collection Time: 06/12/21  7:48 PM   Specimen: BLOOD  Result Value Ref Range Status   Specimen Description BLOOD RAC  Final   Special Requests   Final    BOTTLES DRAWN AEROBIC AND ANAEROBIC Blood Culture results may not be optimal due to an inadequate volume of blood received in culture bottles   Culture   Final    NO GROWTH 5 DAYS Performed at [facility], [address] [place], [place] [address]    Report Status 06/17/2021 FINAL  Final  Blood culture (routine x 2)     Status: None   Collection Time: 06/12/21  7:48 PM   Specimen: BLOOD  Result Value Ref Range Status   Specimen Description BLOOD BLOOD LEFT HAND  Final   Special Requests   Final    BOTTLES DRAWN AEROBIC AND ANAEROBIC Blood Culture results may not be optimal due to an inadequate volume of blood received in culture bottles   Culture   Final    NO GROWTH 5 DAYS Performed at [facility] [address], [place], [place] [address]    Report Status 06/17/2021 FINAL  Final      Studies: No results found.  Scheduled Meds:   apixaban  5 mg Oral BID   aspirin EC  81 mg Oral Daily   azithromycin  250 mg Oral Daily   colchicine  0.3 mg Oral Daily   diltiazem  300 mg Oral Daily   docusate sodium  100 mg Oral BID   famotidine  20 mg Oral Daily   hydrALAZINE  10 mg Oral Q8H   insulin aspart  0-5 Units Subcutaneous QHS   insulin aspart  0-9 Units Subcutaneous TID WC   melatonin  5 mg Oral QHS   metoprolol tartrate  25 mg Oral BID   mometasone-formoterol  2 puff Inhalation BID   multivitamin with minerals  1 tablet Oral Daily   neomycin-bacitracin-polymyxin   Topical BID   predniSONE  50 mg Oral Q breakfast   sodium chloride flush  3 mL Intravenous Q12H   tamsulosin  0.4 mg Oral QPC supper    Continuous Infusions:  sodium chloride 50 mL/hr at 06/21/21 0708   cefTRIAXone (ROCEPHIN)  IV Stopped (06/20/21 2235)     LOS: 9 days     Lorella Nimrod, MD [facility]  To reach me or the doctor on call, go to: [email or web address] Password [email or web address]  06/21/2021, 3:25 PM

## 2021-06-21 NOTE — Progress Notes (Signed)
Nutrition Follow-up  DOCUMENTATION CODES:   Not applicable  INTERVENTION:   Double protein with meal trays   MVI po daily   NUTRITION DIAGNOSIS:   Increased nutrient needs related to chronic illness (COPD) as evidenced by estimated needs.  GOAL:   Patient will meet greater than or equal to 90% of their needs -met   MONITOR:   PO intake, Labs, Weight trends, Skin, I & O's  ASSESSMENT:   70 year old male with past medical history significant for hypertension, asthma, atrial fibrillation, CKD stage IIIb, vitamin B12 deficiency, COPD and morbid obesity who is admitted with acute shortness of breath  Pt reports continued good appetite and oral intake. Pt eating 100% of meals including the double protein on his meal trays. Per chart, pt appears weight stable.   Medications reviewed and include: aspirin, azithromycin, colace, pepcid, insulin, melatonin, MVI, prednisone, NaCl _0 /hr, ceftriaxone   Labs reviewed: K 4.6 wnl, BUN 90(H), creat 3.11(H), P 5.2(H), Mg 2.4(L) Wbc- 14.2(H)  Diet Order:   Diet Order             Diet renal with fluid restriction Fluid restriction: 1500 mL Fluid; Room service appropriate? Yes; Fluid consistency: Thin  Diet effective now                  EDUCATION NEEDS:   No education needs have been identified at this time  Skin:  Skin Assessment: Reviewed RN Assessment  Last BM:  11/7  Height:   Ht Readings from Last 1 Encounters:  06/12/21 _1  (1.88 m)    Weight:   Wt Readings from Last 1 Encounters:  06/21/21 133.6 kg    Ideal Body Weight:  86.4 kg  BMI:  Body mass index is 37.82 kg/m.  Estimated Nutritional Needs:   Kcal:  2700-3000kcal/day  Protein:  >135g/day  Fluid:  2.0L/day  Koleen Distance MS, RD, LDN Please refer to [facility] for RD and/or RD on-call/weekend/after hours pager

## 2021-06-22 LAB — GLUCOSE, CAPILLARY
Glucose-Capillary: 56 mg/dL — ABNORMAL LOW (ref 70–99)
Glucose-Capillary: 58 mg/dL — ABNORMAL LOW (ref 70–99)
Glucose-Capillary: 148 mg/dL — ABNORMAL HIGH (ref 70–99)
Glucose-Capillary: 110 mg/dL — ABNORMAL HIGH (ref 70–99)
Glucose-Capillary: 173 mg/dL — ABNORMAL HIGH (ref 70–99)
Glucose-Capillary: 136 mg/dL — ABNORMAL HIGH (ref 70–99)

## 2021-06-22 LAB — RENAL FUNCTION PANEL
Albumin: 2.4 g/dL — ABNORMAL LOW (ref 3.5–5.0)
Anion gap: 10 (ref 5–15)
BUN: 98 mg/dL — ABNORMAL HIGH (ref 8–23)
CO2: 21 mmol/L — ABNORMAL LOW (ref 22–32)
Calcium: 8.3 mg/dL — ABNORMAL LOW (ref 8.9–10.3)
Chloride: 105 mmol/L (ref 98–111)
Creatinine, Ser: 2.92 mg/dL — ABNORMAL HIGH (ref 0.61–1.24)
GFR, Estimated: 22 mL/min — ABNORMAL LOW (ref >60)
Glucose, Bld: 98 mg/dL (ref 70–99)
Phosphorus: 5 mg/dL — ABNORMAL HIGH (ref 2.5–4.6)
Potassium: 4.7 mmol/L (ref 3.5–5.1)
Sodium: 136 mmol/L (ref 135–145)

## 2021-06-22 MED ORDER — PREDNISONE 10 MG (21) PO TBPK: 10.0000 mg | ORAL_TABLET | ORAL | Status: DC

## 2021-06-22 MED ORDER — PREDNISONE 10 MG (21) PO TBPK
20.0000 mg | ORAL_TABLET | Freq: Every morning | ORAL | Status: AC
  Administered 2021-06-23: 20 mg via ORAL
  Filled 2021-06-22: qty 21

## 2021-06-22 MED ORDER — PREDNISONE 10 MG (21) PO TBPK: 10.0000 mg | ORAL_TABLET | Freq: Three times a day (TID) | ORAL | Status: DC

## 2021-06-22 MED ORDER — PREDNISONE 10 MG (21) PO TBPK: 10.0000 mg | ORAL_TABLET | Freq: Four times a day (QID) | ORAL | Status: DC

## 2021-06-22 MED ORDER — PREDNISONE 10 MG (21) PO TBPK: 20.0000 mg | ORAL_TABLET | Freq: Every evening | ORAL | Status: DC

## 2021-06-22 MED ORDER — IPRATROPIUM-ALBUTEROL 0.5-2.5 (3) MG/3ML IN SOLN: 3.0000 mL | RESPIRATORY_TRACT | Status: DC | PRN

## 2021-06-22 NOTE — Progress Notes (Signed)
Inpatient Diabetes Program Recommendations  AACE/ADA: New Consensus Statement on Inpatient Glycemic Control (2015)  Target Ranges:  Prepandial:   less than 140 mg/dL      Peak postprandial:   less than 180 mg/dL (1-2 hours)      Critically ill patients:  140 - 180 mg/dL  Results for Matthew [name], Matthew [name] (MRN [id number]) as of 06/22/2021 08:11  Ref. Range 06/21/2021 08:00 06/21/2021 11:19 06/21/2021 15:23 06/21/2021 20:21  Glucose-Capillary Latest Ref Range: 70 - 99 mg/dL 96 129 (H)  1 unit Novolog  165 (H)  2 units Novolog  142 (H)  Results for Matthew [name], Matthew [name] (MRN [id number]) as of 06/22/2021 08:11  Ref. Range 06/22/2021 07:36  Glucose-Capillary Latest Ref Range: 70 - 99 mg/dL 56 (L)  Results for Matthew [name], Matthew [name] (MRN [id number]) as of 06/22/2021 08:11  Ref. Range 06/13/2021 03:19  Hemoglobin A1C Latest Ref Range: 4.8 - 5.6 % 5.9 (H)    Admit with: SOB/ Asthma/ AFib with RVR  No History of Diabetes   Current Orders: Novolog Sensitive Correction Scale/ SSI (0-9 units) TID AC + HS     MD- Note Solumedrol stopped and pt started on Prednisone 50 mg daily  Hypoglycemic this AM (CBG 56)  Please consider stopping Novolog SSi for now but continue CBG checks while on Prednisone    --Will follow patient during hospitalization--  Wyn Quaker RN, MSN, CDE Diabetes Coordinator Inpatient Glycemic Control Team Team Pager: [phone] (8a-5p)

## 2021-06-22 NOTE — Progress Notes (Signed)
Physical Therapy Treatment Patient Details Name: Matthew [name] MRN: [id number] DOB: 31-Oct-1950 Today's Date: 06/22/2021   History of Present Illness Pt admitted for complaints of cough and SOB symptoms. HIstory includes HTN, asthma, COPD, and obesity.    PT Comments    Pt continuing to put forth good effort into therapy sessions. Pt continues to be limited by decreased activity tolerance and shortness of breath with exertional activities. Pt declining any ambulation today however, was able to complete stand step transfer with RW + CGA to recliner chair. Pt will continue to benefit from further PT to improve overall functional mobility and endurance.    Recommendations for follow up therapy are one component of a multi-disciplinary discharge planning process, led by the attending physician.  Recommendations may be updated based on patient status, additional functional criteria and insurance authorization.  Follow Up Recommendations  Skilled nursing-short term rehab (<3 hours/day)     Assistance Recommended at Discharge Intermittent Supervision/Assistance  Equipment Recommendations  Other (comment)    Recommendations for Other Services       Precautions / Restrictions Precautions Precautions: Fall Restrictions Weight Bearing Restrictions: No     Mobility  Bed Mobility Overal bed mobility: Needs Assistance       Supine to sit: Supervision          Transfers Overall transfer level: Needs assistance Equipment used: Rolling walker (2 wheels) Transfers: Bed to chair/wheelchair/BSC Sit to Stand: Min guard;From elevated surface     Step pivot transfers: Min guard;From elevated surface     General transfer comment: CGA for sit to stands from elevated surface; Able to complete stand step transfer with BRW    Ambulation/Gait               General Gait Details: Pt deferring due to fatigue   Stairs             Wheelchair Mobility    Modified Rankin  (Stroke Patients Only)       Balance Overall balance assessment: Needs assistance Sitting-balance support: Bilateral upper extremity supported;Feet supported Sitting balance-Leahy Scale: Good     Standing balance support: Bilateral upper extremity supported;During functional activity;Reliant on assistive device for balance Standing balance-Leahy Scale: Fair Standing balance comment: Able to stand with decreased reliance on AD but still requiring UE support to maintain balance                            Cognition Arousal/Alertness: Awake/alert Behavior During Therapy: WFL for tasks assessed/performed Overall Cognitive Status: Within Functional Limits for tasks assessed                                          Exercises General Exercises - Lower Extremity Ankle Circles/Pumps: AROM;Strengthening;Both;20 reps;Seated Long Arc Quad: AROM;Strengthening;Both;20 reps;Seated Hip Flexion/Marching: AROM;Strengthening;Both;20 reps;Seated Other Exercises Other Exercises: Sit to stands from elevated bed surface x 4 with additional rest time between sets due to increased shortness of breath. Oxygen saturation monitored and 98% on RA    General Comments        Pertinent Vitals/Pain Pain Assessment: No/denies pain    Home Living                          Prior Function            PT  Goals (current goals can now be found in the care plan section) Acute Rehab PT Goals Patient Stated Goal: to get stronger PT Goal Formulation: With patient Time For Goal Achievement: 06/27/21 Potential to Achieve Goals: Good Progress towards PT goals: Progressing toward goals    Frequency    Min 2X/week      PT Plan Current plan remains appropriate    Co-evaluation              AM-PAC PT "6 Clicks" Mobility   Outcome Measure  Help needed turning from your back to your side while in a flat bed without using bedrails?: None Help needed moving from  lying on your back to sitting on the side of a flat bed without using bedrails?: None Help needed moving to and from a bed to a chair (including a wheelchair)?: A Little Help needed standing up from a chair using your arms (e.g., wheelchair or bedside chair)?: A Lot Help needed to walk in hospital room?: Total Help needed climbing 3-5 steps with a railing? : Total 6 Click Score: 15    End of Session Equipment Utilized During Treatment: Gait belt;Oxygen Activity Tolerance: Patient limited by fatigue Patient left: in chair;with call bell/phone within reach;with chair alarm set Nurse Communication: Mobility status PT Visit Diagnosis: Muscle weakness (generalized) (M62.81);History of falling (Z91.81);Difficulty in walking, not elsewhere classified (R26.2)     Time: 9449-6759 PT Time Calculation (min) (ACUTE ONLY): 17 min  Charges:                        Andrey Campanile, SPT   Andrey Campanile 06/22/2021, 4:14 PM

## 2021-06-22 NOTE — Progress Notes (Signed)
[facility]  ROUNDING NOTE   Subjective:   Patient seen resting in bed Currently short of breath due to recent trip to bathroom. Tolerating meals without nausea and vomiting  Objective:  Vital signs in last 24 hours:  Temp:  [97.4 F (36.3 C)-98.7 F (37.1 C)] 98.3 F (36.8 C) (11/09 1527) Pulse Rate:  [64-109] 69 (11/09 1527) Resp:  [16-20] 20 (11/09 1527) BP: (114-123)/(61-84) 118/70 (11/09 1527) SpO2:  [95 %-100 %] 95 % (11/09 1527) Weight:  [133.9 kg] 133.9 kg (11/09 0312)  Weight change: 0.3 kg Filed Weights   06/19/21 0657 06/21/21 0340 06/22/21 0312  Weight: 131.1 kg 133.6 kg 133.9 kg    Intake/Output: I/O last 3 completed shifts: In: 2293.7 [P.O.:480; I.V.:1713.7; IV Piggyback:100] Out: 2575 [Urine:2575]   Intake/Output this shift:  Total I/O In: 720 [P.O.:720] Out: 1825 [Urine:1825]  Physical Exam: General: NAD  Head: Normocephalic, atraumatic. Moist oral mucosal membranes  Eyes: Anicteric  Lungs:  Clear to auscultation, normal effort  Heart: Regular rate and rhythm  Abdomen:  Soft, nontender, obese  Extremities:  no peripheral edema.  Neurologic: Nonfocal, moving all four extremities  Skin: No lesions       Basic Metabolic Panel: Recent Labs  Lab 06/16/21 0428 06/17/21 0924 06/17/21 2041 06/18/21 0414 06/19/21 0650 06/20/21 0430 06/21/21 0903 06/22/21 0330  NA 132* 134*   < > 134* 135 138 135 136  K 5.0 6.1*   < > 5.8* 5.2* 6.2* 4.6 4.7  CL 102 103   < > 103 103 109 101 105  CO2 22 22   < > 22 25 25 24  21*  GLUCOSE 119* 148*   < > 111* 98 119* 109* 98  BUN 97* 97*   < > 104* 100* 99* 90* 98*  CREATININE 3.58* 3.12*   < > 2.91* 3.00* 3.18* 3.11* 2.92*  CALCIUM 8.5* 8.6*   < > 8.3* 8.3* 9.2 8.5* 8.3*  MG 2.3 2.8*  --  2.4  --   --   --   --   PHOS 4.8* 5.2*  --  4.4  --   --  5.2* 5.0*   < > = values in this interval not displayed.     Liver Function Tests: Recent Labs  Lab 06/21/21 0903 06/22/21 0330  ALBUMIN 2.4*  2.4*    No results for input(s): LIPASE, AMYLASE in the last 168 hours. No results for input(s): AMMONIA in the last 168 hours.  CBC: Recent Labs  Lab 06/16/21 0428 06/17/21 0924 06/17/21 2047 06/18/21 0414  WBC 15.6* 11.8* 15.5* 14.2*  NEUTROABS  --   --  13.8*  --   HGB 11.6* 12.1* 12.1* 11.1*  HCT 37.3* 38.3* 38.6* 35.9*  MCV 104.8* 105.2* 104.0* 103.8*  PLT 390 335 354 295     Cardiac Enzymes: No results for input(s): CKTOTAL, CKMB, CKMBINDEX, TROPONINI in the last 168 hours.  BNP: Invalid input(s): POCBNP  CBG: Recent Labs  Lab 06/21/21 2021 06/22/21 0736 06/22/21 0806 06/22/21 0951 06/22/21 1132  GLUCAP 142* 56* 58* 148* 110*     Microbiology: Results for orders placed or performed during the hospital encounter of 06/12/21  Resp Panel by RT-PCR (Flu A&B, Covid) Nasopharyngeal Swab     Status: None   Collection Time: 06/12/21  6:35 PM   Specimen: Nasopharyngeal Swab; Nasopharyngeal(NP) swabs in vial transport medium  Result Value Ref Range Status   SARS Coronavirus 2 by RT PCR NEGATIVE NEGATIVE Final    Comment: (  NOTE) SARS-CoV-2 target nucleic acids are NOT DETECTED.  The SARS-CoV-2 RNA is generally detectable in upper respiratory specimens during the acute phase of infection. The lowest concentration of SARS-CoV-2 viral copies this assay can detect is 138 copies/mL. A negative result does not preclude SARS-Cov-2 infection and should not be used as the sole basis for treatment or other patient management decisions. A negative result may occur with  improper specimen collection/handling, submission of specimen other than nasopharyngeal swab, presence of viral mutation(s) within the areas targeted by this assay, and inadequate number of viral copies(<138 copies/mL). A negative result must be combined with clinical observations, patient history, and epidemiological information. The expected result is Negative.  Fact Sheet for Patients:   EntrepreneurPulse.com.au  Fact Sheet for Healthcare Providers:  IncredibleEmployment.be  This test is no t yet approved or cleared by the [place] FDA and  has been authorized for detection and/or diagnosis of SARS-CoV-2 by FDA under an Emergency Use Authorization (EUA). This EUA will remain  in effect (meaning this test can be used) for the duration of the COVID-19 declaration under Section 564(b)(1) of the Act, 21 U.S.C.section 360bbb-3(b)(1), unless the authorization is terminated  or revoked sooner.       Influenza A by PCR NEGATIVE NEGATIVE Final   Influenza B by PCR NEGATIVE NEGATIVE Final    Comment: (NOTE) The Xpert Xpress SARS-CoV-2/FLU/RSV plus assay is intended as an aid in the diagnosis of influenza from Nasopharyngeal swab specimens and should not be used as a sole basis for treatment. Nasal washings and aspirates are unacceptable for Xpert Xpress SARS-CoV-2/FLU/RSV testing.  Fact Sheet for Patients: EntrepreneurPulse.com.au  Fact Sheet for Healthcare Providers: IncredibleEmployment.be  This test is not yet approved or cleared by the [place] FDA and has been authorized for detection and/or diagnosis of SARS-CoV-2 by FDA under an Emergency Use Authorization (EUA). This EUA will remain in effect (meaning this test can be used) for the duration of the COVID-19 declaration under Section 564(b)(1) of the Act, 21 U.S.C. section 360bbb-3(b)(1), unless the authorization is terminated or revoked.  Performed at [facility], [address] [place], [place] [address]   Blood culture (routine x 2)     Status: None   Collection Time: 06/12/21  7:48 PM   Specimen: BLOOD  Result Value Ref Range Status   Specimen Description BLOOD RAC  Final   Special Requests   Final    BOTTLES DRAWN AEROBIC AND ANAEROBIC Blood Culture results may not be optimal due to an inadequate volume of  blood received in culture bottles   Culture   Final    NO GROWTH 5 DAYS Performed at [facility], [address] [place], [place] [address]    Report Status 06/17/2021 FINAL  Final  Blood culture (routine x 2)     Status: None   Collection Time: 06/12/21  7:48 PM   Specimen: BLOOD  Result Value Ref Range Status   Specimen Description BLOOD BLOOD LEFT HAND  Final   Special Requests   Final    BOTTLES DRAWN AEROBIC AND ANAEROBIC Blood Culture results may not be optimal due to an inadequate volume of blood received in culture bottles   Culture   Final    NO GROWTH 5 DAYS Performed at [facility], [address], [place], [place] [address]    Report Status 06/17/2021 FINAL  Final    Coagulation Studies: No results for input(s): LABPROT, INR in the last 72 hours.  Urinalysis:  No results for input(s): COLORURINE, LABSPEC, [place], GLUCOSEU, HGBUR, BILIRUBINUR, KETONESUR, PROTEINUR, UROBILINOGEN, NITRITE, LEUKOCYTESUR in the last 72 hours.  Invalid input(s): APPERANCEUR    Imaging: No results found.   Medications:    sodium chloride 50 mL/hr at 06/22/21 0315   cefTRIAXone (ROCEPHIN)  IV 1 g (06/21/21 2237)    apixaban  5 mg Oral BID   aspirin EC  81 mg Oral Daily   colchicine  0.3 mg Oral Daily   diltiazem  300 mg Oral Daily   famotidine  20 mg Oral Daily   hydrALAZINE  10 mg Oral Q8H   melatonin  5 mg Oral QHS   metoprolol tartrate  25 mg Oral BID   mometasone-formoterol  2 puff Inhalation BID   multivitamin with minerals  1 tablet Oral Daily   neomycin-bacitracin-polymyxin   Topical BID   [START ON 06/23/2021] predniSONE  10 mg Oral PC supper   [START ON 06/24/2021] predniSONE  10 mg Oral 3 x daily with food   [START ON 06/25/2021] predniSONE  10 mg Oral 4X daily taper   [START ON 06/23/2021] predniSONE  10 mg Oral PC lunch   [START ON 06/23/2021] predniSONE  20 mg Oral AC breakfast   [START ON 06/23/2021] predniSONE  20 mg Oral Nightly    [START ON 06/24/2021] predniSONE  20 mg Oral Nightly   sodium chloride flush  3 mL Intravenous Q12H   tamsulosin  0.4 mg Oral QPC supper   acetaminophen **OR** acetaminophen, bisacodyl, ipratropium-albuterol, ondansetron (ZOFRAN) IV, oxyCODONE, polyethylene glycol  Assessment/ Plan:  [name] is a 70 y.o.  male with medical problems of hypertension, atrial fibrillation, chronic edema, LVH,   was admitted on 06/12/2021 for :SOB (shortness of breath) [R06.02] Bilateral leg edema [R60.0] Acute respiratory failure with hypoxia (HCC) [J96.01] Atrial fibrillation with RVR (HCC) [I48.91]   Acute kidney injury on chronic kidney disease stage IIIb Baseline creatinine 1.81/GFR 40 from December 04, 2020 The cause for AKI is not entirely clear but possibly related to hemodynamic instability from A. fib and RVR. Urinalysis at admission appears benign Renal imaging negative for obstruction.  Avoid all nephrotoxic agents and therapies if possible.  Avoid hypotension.  Creatinine continues to improve slowly.  Continue IV hydration at this time.  We will continue to monitor.  Will require outpatient follow-up with nephrology at discharge.      2. Chronic lower extremity edema Greatly improved since admission   3. Hyperkalemia/hyponatremia Likely from AKI. Sodium 136 Potassium currently 4.7    4. Hesitancy of urination Resolved with tamsulosin    LOS: [place] 11/9/20223:36 PM

## 2021-06-22 NOTE — Progress Notes (Signed)
PROGRESS NOTE  Matthew [name] KCL:[id number] DOB: Mar 22, 1951 DOA: 06/12/2021 PCP: [name], MD  HPI/Recap of past 24 hours: Brief Narrative:   This is a 70 year old male with past medical history significant for hypertension, asthma, atrial fibrillation, he is on Eliquis for this, CKD stage IIIb, vitamin B12 deficiency, morbid obesity, presenting with acute shortness of breath to the emergency department.  Patient was initially started on steroids and IV Lasix he was also found to have A. fib with rapid ventricular rate.  Patient was admitted for asthma exacerbation and atrial fibrillation with rapid ventricular rate his serum creatinine was noted to be worsening and nephrology was consulted. Heart rate currently improved.  Worsening potassium and renal function. PT is recommending SNF.  11/8: Renal function now stable, hyperkalemia resolved.    Subjective: Patient is anxious about being discharged. Denies baseline O2 use. No shortness of breath today. No pain or complaints at this time.  Assessment/Plan: Principal Problem:   SOB (shortness of breath) Active Problems:   Moderate persistent asthma with exacerbation   Essential hypertension   COPD with acute exacerbation (HCC)   Edema of both legs   Atrial fibrillation with RVR (HCC)   Acute kidney injury superimposed on chronic kidney disease (HCC)   Elevated glucose level   Obesity (BMI 30-39.9)   Macrocytosis   Acute respiratory failure with hypoxia (HCC)   Acute gout of right ankle   Acute Hypoxic respiratory failure  asthma  COPD-  - Azithromycin (11/6-11/9) - CTX (11/4-11/9) - steroids (10/31-11/9) will start prednisone taper today.  - dulera BID - duonebs prn - wean to room air - ambulate with pulse ox   Atrial fibrillation with RVR  HFrEF- rate controlled.  - Continue metoprolol at 25 mg BID -Continue Cardizem ER 300 mg -Continue Eliquis for anticoagulation - ECG to monitor for heart block with this  combination   Acute kidney injury on CKD stage IIIb: Renal function now stable at baseline.  Renal ultrasound positive for medical renal disease. Nephrology on board-appreciate their help, no need for emergent dialysis. Continue to monitor creatinine for improvement avoid nephrotoxic drugs   Hyperkalemia: Resolved with [place]. Likely related to AKI on CKD -Monitor potassium  Right foot pain: Improving likely due to gout his uric acid is elevated Will continue prednisone and colchicine   Essential hypertension: Continue Cardizem and hydralazine  Urinary hesitancy- resolved - continue tamsulosin   Morbid obesity. Estimated body mass index is 37.9 kg/m as calculated from the following:   Height as of this encounter: 6\' 2"  (1.88 m).   Weight as of this encounter: 133.9 kg.  Patient will benefit with weight loss program Recommend outpatient sleep study   Hyperglycemia: Recent hemoglobin A1c is 5.9 which is well below goal for age. Blood sugars elevated related to steroid use.  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  Awaiting placement. Family Communication: None at bedside  Disposition Plan:   Status is: Inpatient   Dispo: The patient is from: Home              Anticipated d/c is to: SNF              Anticipated d/c date is: 11/10  Patient close to being medically stable for discharge.   Consultants: Nephrology  Procedures: None  Antimicrobials: Ceftriaxone Zithromax  DVT prophylaxis: Eliquis   Objective: Vitals:   06/21/21 1935 06/21/21 2321 06/22/21 0312 06/22/21 0735  BP: 123/70 116/76 118/61 118/84  Pulse: 90 (!) 109 99 87  Resp: 16 16 17 18   Temp: 97.8 F (36.6 C) (!) 97.4 F (36.3 C) 98.7 F (37.1 C) 98 F  (36.7 C)  TempSrc:      SpO2: 100% 97% 96% 95%  Weight:   133.9 kg   Height:        Intake/Output Summary (Last 24 hours) at 06/22/2021 0740 Last data filed at 06/22/2021 0314 Gross per 24 hour  Intake 1438.89 ml  Output 2575 ml  Net -1136.11 ml    Filed Weights   06/19/21 0657 06/21/21 0340 06/22/21 0312  Weight: 131.1 kg 133.6 kg 133.9 kg   Body mass index is 37.9 kg/m.  Exam: General.  Obese gentleman, in no acute distress. Pulmonary.  Lungs clear bilaterally, normal respiratory effort. CV.  Regular rate and rhythm, no JVD, rub or murmur. Abdomen.  Soft, nontender, nondistended, BS positive. CNS.  Alert and oriented .  No focal neurologic deficit. Extremities.  Trace LE edema, no cyanosis, pulses intact and symmetrical. Psychiatry.  Judgment and insight appears normal. Anxious mood  Data Reviewed: BMP Latest Ref Rng & Units 06/22/2021 06/21/2021 06/20/2021  Glucose 70 - 99 mg/dL 98 109(H) 119(H)  BUN 8 - 23 mg/dL 98(H) 90(H) 99(H)  Creatinine 0.61 - 1.24 mg/dL 2.92(H) 3.11(H) 3.18(H)  Sodium 135 - 145 mmol/L 136 135 138  Potassium 3.5 - 5.1 mmol/L 4.7 4.6 6.2(H)  Chloride 98 - 111 mmol/L 105 101 109  CO2 22 - 32 mmol/L 21(L) 24 25  Calcium 8.9 - 10.3 mg/dL 8.3(L) 8.5(L) 9.2   CBG: Recent Labs  Lab 06/21/21 0800 06/21/21 1119 06/21/21 1523 06/21/21 2021 06/22/21 0736  GLUCAP 96 129* 165* 142* 56*   Studies: No results found.  Scheduled Meds:  apixaban  5 mg Oral BID   aspirin EC  81 mg Oral Daily   azithromycin  250 mg Oral Daily   colchicine  0.3 mg Oral Daily   diltiazem  300 mg Oral Daily   docusate sodium  100 mg Oral BID   famotidine  20 mg Oral Daily   hydrALAZINE  10 mg Oral Q8H   insulin aspart  0-5 Units Subcutaneous QHS   insulin aspart  0-9 Units Subcutaneous TID WC   melatonin  5 mg Oral QHS   metoprolol tartrate  25 mg Oral BID   mometasone-formoterol  2 puff Inhalation BID   multivitamin with minerals  1 tablet Oral Daily    neomycin-bacitracin-polymyxin   Topical BID   predniSONE  50 mg Oral Q breakfast   sodium chloride flush  3 mL Intravenous Q12H   tamsulosin  0.4 mg Oral QPC supper    Continuous Infusions:  sodium chloride 50 mL/hr at 06/22/21 0315   cefTRIAXone (ROCEPHIN)  IV 1 g (06/21/21 2237)     LOS: 10 days     Richarda Osmond, MD [facility]  To reach me or the doctor on call, go to: [email or web address] Password [email or web address]  06/22/2021, 7:40 AM

## 2021-06-22 NOTE — Progress Notes (Signed)
Bed alarm sounding.  Pt found sitting on side of bed with CPAP mask removed.  Previously asleep with CPAP in place. Reports being anxious, able to speak in complete sentences without distress noted.  Respiratory Therapy on floor and in to assess at same time.  Nasal cannula reapplied per pt request at this time. Will continue to monitor.

## 2021-06-22 NOTE — Progress Notes (Signed)
Occupational Therapy Treatment Patient Details Name: Matthew [name] MRN: [id number] DOB: Oct 11, 1950 Today's Date: 06/22/2021   History of present illness Pt admitted for complaints of cough and SOB symptoms. HIstory includes HTN, asthma, COPD, and obesity.   OT comments  Matthew [name] seen for OT treatment on this date. Upon arrival to room pt awake/alert but reporting just returned to bed and feeling extremely fatigued. Pt agreeable to tx and performing ther-ex at EOB. Pt requiring SUP + VCs for bed mobility. Pt performed BUE shoulder flexion x 10, LE marching x 20 (10 each side), chest press x62m lateral leans x10. Nurse reports weaned to RA today, SpO2 87 upon sitting, maintained upper 90s throughout ther ex, sitting EOB. Pt making good progress toward goals. Pt continues to benefit from skilled OT services to maximize return to PLOF and minimize risk of future falls, injury, caregiver burden, and readmission. Will continue to follow POC. Discharge recommendation remains appropriate.     Recommendations for follow up therapy are one component of a multi-disciplinary discharge planning process, led by the attending physician.  Recommendations may be updated based on patient status, additional functional criteria and insurance authorization.    Follow Up Recommendations  Skilled nursing-short term rehab (<3 hours/day)    Assistance Recommended at Discharge Intermittent Supervision/Assistance  Equipment Recommendations  BSC/3in1    Recommendations for Other Services      Precautions / Restrictions Precautions Precautions: Fall Restrictions Weight Bearing Restrictions: No       Mobility Bed Mobility Overal bed mobility: Needs Assistance Bed Mobility: Supine to Sit;Sit to Supine     Supine to sit: Supervision Sit to supine: Supervision   General bed mobility comments: VCs for situating in bed    Transfers   General transfer comment: pt declined     Balance Overall balance  assessment: Needs assistance Sitting-balance support: Bilateral upper extremity supported;Feet supported Sitting balance-Leahy Scale: Good     Standing balance support: Bilateral upper extremity supported;During functional activity;Reliant on assistive device for balance Standing balance-Leahy Scale: Fair Standing balance comment: Able to stand with decreased reliance on AD but still requiring UE support to maintain balance                           ADL either performed or assessed with clinical judgement   ADL  Pt deferred                                            Extremity/Trunk Assessment              Vision       Perception     Praxis      Cognition Arousal/Alertness: Awake/alert Behavior During Therapy: WFL for tasks assessed/performed Overall Cognitive Status: Within Functional Limits for tasks assessed                                 General Comments: pt reported fatigue as limitation to therapy but agreeable to EOB therapy          Exercises Exercises: Other exercises;General Upper Extremity General Exercises - Upper Extremity Shoulder Flexion: AROM;10 reps;Seated;Both General Exercises - Lower Extremity Hip Flexion/Marching: AROM;Strengthening;Both;20 reps;Seated Other Exercises Other Exercises:  Other Exercises: sup<>sit, ther ex seated EOB chest press, overhead press, lateral leans  Shoulder Instructions       General Comments Nurse reports weaned from O2 today, SpO2 87 upon sitting, maintained upper 90s throughout ther ex, sitting EOB    Pertinent Vitals/ Pain       Pain Assessment: No/denies pain  Home Living                                          Prior Functioning/Environment              Frequency  Min 2X/week        Progress Toward Goals  OT Goals(current goals can now be found in the care plan section)  Progress towards OT goals: Progressing toward  goals  Acute Rehab OT Goals Patient Stated Goal: to return to PLOF OT Goal Formulation: With patient Time For Goal Achievement: 06/27/21 Potential to Achieve Goals: Good ADL Goals Pt Will Perform Grooming: with modified independence;standing Pt Will Perform Lower Body Dressing: sit to/from stand;Independently Pt Will Transfer to Toilet: ambulating;regular height toilet;with modified independence  Plan Discharge plan remains appropriate;Frequency needs to be updated    Co-evaluation                 AM-PAC OT "6 Clicks" Daily Activity     Outcome Measure   Help from another person eating meals?: None Help from another person taking care of personal grooming?: A Lot Help from another person toileting, which includes using toliet, bedpan, or urinal?: A Lot Help from another person bathing (including washing, rinsing, drying)?: A Little Help from another person to put on and taking off regular upper body clothing?: A Little Help from another person to put on and taking off regular lower body clothing?: A Little 6 Click Score: 17    End of Session    OT Visit Diagnosis: Other abnormalities of gait and mobility (R26.89);History of falling (Z91.81)   Activity Tolerance Patient tolerated treatment well;Patient limited by fatigue   Patient Left in bed;with call bell/phone within reach;with bed alarm set   Nurse Communication          Time: 3729-0211 OT Time Calculation (min): 12 min  Charges: OT General Charges $OT Visit: 1 Visit OT Treatments $Therapeutic Exercise: 8-22 mins Nino Glow, Markus Daft 06/22/2021, 4:21 PM

## 2021-06-22 NOTE — TOC Progression Note (Signed)
Transition of Care [facility]) - Progression Note    Patient Details  Name: Matthew [name] MRN: [id number] Date of Birth: Apr 23, 1951  [facility]) CM/SW Contact  Eileen Stanford, LCSW Phone Number: 06/22/2021, 11:51 AM  Clinical Narrative:   Per MD pt will not dc today. SNF updated.    Expected Discharge Plan: [place] Barriers to Discharge: Continued Medical Work up  Expected Discharge Plan and Services Expected Discharge Plan: [place] In-house Referral: Clinical Social Work   Post Acute Care Choice: [place] Living arrangements for the past 2 months: Single Family Home                                       Social Determinants of Health (SDOH) Interventions    Readmission Risk Interventions No flowsheet data found.

## 2021-06-23 DIAGNOSIS — R079 Chest pain, unspecified: Secondary | ICD-10-CM | POA: Diagnosis not present

## 2021-06-23 DIAGNOSIS — Z933 Colostomy status: Secondary | ICD-10-CM | POA: Diagnosis not present

## 2021-06-23 DIAGNOSIS — I4891 Unspecified atrial fibrillation: Secondary | ICD-10-CM | POA: Diagnosis not present

## 2021-06-23 DIAGNOSIS — R0689 Other abnormalities of breathing: Secondary | ICD-10-CM | POA: Diagnosis not present

## 2021-06-23 DIAGNOSIS — Z7982 Long term (current) use of aspirin: Secondary | ICD-10-CM | POA: Diagnosis not present

## 2021-06-23 DIAGNOSIS — M79652 Pain in left thigh: Secondary | ICD-10-CM | POA: Diagnosis not present

## 2021-06-23 DIAGNOSIS — Z7951 Long term (current) use of inhaled steroids: Secondary | ICD-10-CM | POA: Diagnosis not present

## 2021-06-23 DIAGNOSIS — Z825 Family history of asthma and other chronic lower respiratory diseases: Secondary | ICD-10-CM | POA: Diagnosis not present

## 2021-06-23 DIAGNOSIS — N1832 Chronic kidney disease, stage 3b: Secondary | ICD-10-CM | POA: Diagnosis not present

## 2021-06-23 DIAGNOSIS — Z96643 Presence of artificial hip joint, bilateral: Secondary | ICD-10-CM | POA: Diagnosis present

## 2021-06-23 DIAGNOSIS — I2510 Atherosclerotic heart disease of native coronary artery without angina pectoris: Secondary | ICD-10-CM | POA: Diagnosis present

## 2021-06-23 DIAGNOSIS — Z808 Family history of malignant neoplasm of other organs or systems: Secondary | ICD-10-CM | POA: Diagnosis not present

## 2021-06-23 DIAGNOSIS — Z885 Allergy status to narcotic agent status: Secondary | ICD-10-CM | POA: Diagnosis not present

## 2021-06-23 DIAGNOSIS — I517 Cardiomegaly: Secondary | ICD-10-CM | POA: Diagnosis not present

## 2021-06-23 DIAGNOSIS — J45901 Unspecified asthma with (acute) exacerbation: Secondary | ICD-10-CM | POA: Diagnosis not present

## 2021-06-23 DIAGNOSIS — Z7401 Bed confinement status: Secondary | ICD-10-CM | POA: Diagnosis not present

## 2021-06-23 DIAGNOSIS — Z20822 Contact with and (suspected) exposure to covid-19: Secondary | ICD-10-CM | POA: Diagnosis not present

## 2021-06-23 DIAGNOSIS — Z7901 Long term (current) use of anticoagulants: Secondary | ICD-10-CM | POA: Diagnosis not present

## 2021-06-23 DIAGNOSIS — I5031 Acute diastolic (congestive) heart failure: Secondary | ICD-10-CM | POA: Diagnosis not present

## 2021-06-23 DIAGNOSIS — I959 Hypotension, unspecified: Secondary | ICD-10-CM | POA: Diagnosis not present

## 2021-06-23 DIAGNOSIS — Z23 Encounter for immunization: Secondary | ICD-10-CM | POA: Diagnosis not present

## 2021-06-23 DIAGNOSIS — I10 Essential (primary) hypertension: Secondary | ICD-10-CM | POA: Diagnosis not present

## 2021-06-23 DIAGNOSIS — E669 Obesity, unspecified: Secondary | ICD-10-CM | POA: Diagnosis not present

## 2021-06-23 DIAGNOSIS — R296 Repeated falls: Secondary | ICD-10-CM | POA: Diagnosis not present

## 2021-06-23 DIAGNOSIS — J441 Chronic obstructive pulmonary disease with (acute) exacerbation: Secondary | ICD-10-CM | POA: Diagnosis not present

## 2021-06-23 DIAGNOSIS — R52 Pain, unspecified: Secondary | ICD-10-CM | POA: Diagnosis not present

## 2021-06-23 DIAGNOSIS — N179 Acute kidney failure, unspecified: Secondary | ICD-10-CM | POA: Diagnosis not present

## 2021-06-23 DIAGNOSIS — J90 Pleural effusion, not elsewhere classified: Secondary | ICD-10-CM | POA: Diagnosis not present

## 2021-06-23 DIAGNOSIS — R000 Tachycardia, unspecified: Secondary | ICD-10-CM | POA: Diagnosis not present

## 2021-06-23 DIAGNOSIS — Z9114 Patient's other noncompliance with medication regimen: Secondary | ICD-10-CM | POA: Diagnosis not present

## 2021-06-23 DIAGNOSIS — I509 Heart failure, unspecified: Secondary | ICD-10-CM | POA: Diagnosis not present

## 2021-06-23 DIAGNOSIS — J9601 Acute respiratory failure with hypoxia: Secondary | ICD-10-CM | POA: Diagnosis not present

## 2021-06-23 DIAGNOSIS — E875 Hyperkalemia: Secondary | ICD-10-CM | POA: Diagnosis not present

## 2021-06-23 DIAGNOSIS — R0602 Shortness of breath: Secondary | ICD-10-CM | POA: Diagnosis not present

## 2021-06-23 DIAGNOSIS — D631 Anemia in chronic kidney disease: Secondary | ICD-10-CM | POA: Diagnosis not present

## 2021-06-23 DIAGNOSIS — G4700 Insomnia, unspecified: Secondary | ICD-10-CM | POA: Diagnosis not present

## 2021-06-23 DIAGNOSIS — I5032 Chronic diastolic (congestive) heart failure: Secondary | ICD-10-CM | POA: Diagnosis not present

## 2021-06-23 DIAGNOSIS — I482 Chronic atrial fibrillation, unspecified: Secondary | ICD-10-CM | POA: Diagnosis present

## 2021-06-23 DIAGNOSIS — G894 Chronic pain syndrome: Secondary | ICD-10-CM | POA: Diagnosis not present

## 2021-06-23 DIAGNOSIS — J45909 Unspecified asthma, uncomplicated: Secondary | ICD-10-CM | POA: Diagnosis not present

## 2021-06-23 DIAGNOSIS — J449 Chronic obstructive pulmonary disease, unspecified: Secondary | ICD-10-CM | POA: Diagnosis not present

## 2021-06-23 DIAGNOSIS — S728X2S Other fracture of left femur, sequela: Secondary | ICD-10-CM | POA: Diagnosis not present

## 2021-06-23 DIAGNOSIS — N189 Chronic kidney disease, unspecified: Secondary | ICD-10-CM | POA: Diagnosis not present

## 2021-06-23 DIAGNOSIS — R0789 Other chest pain: Secondary | ICD-10-CM | POA: Diagnosis not present

## 2021-06-23 DIAGNOSIS — Z79899 Other long term (current) drug therapy: Secondary | ICD-10-CM | POA: Diagnosis not present

## 2021-06-23 DIAGNOSIS — I130 Hypertensive heart and chronic kidney disease with heart failure and stage 1 through stage 4 chronic kidney disease, or unspecified chronic kidney disease: Secondary | ICD-10-CM | POA: Diagnosis not present

## 2021-06-23 DIAGNOSIS — Z888 Allergy status to other drugs, medicaments and biological substances status: Secondary | ICD-10-CM | POA: Diagnosis not present

## 2021-06-23 DIAGNOSIS — F32A Depression, unspecified: Secondary | ICD-10-CM | POA: Diagnosis not present

## 2021-06-23 DIAGNOSIS — J4551 Severe persistent asthma with (acute) exacerbation: Secondary | ICD-10-CM | POA: Diagnosis not present

## 2021-06-23 LAB — RESP PANEL BY RT-PCR (FLU A&B, COVID) ARPGX2
Influenza A by PCR: NEGATIVE
Influenza B by PCR: NEGATIVE
SARS Coronavirus 2 by RT PCR: NEGATIVE

## 2021-06-23 LAB — RENAL FUNCTION PANEL
Albumin: 2.3 g/dL — ABNORMAL LOW (ref 3.5–5.0)
Anion gap: 8 (ref 5–15)
BUN: 80 mg/dL — ABNORMAL HIGH (ref 8–23)
CO2: 22 mmol/L (ref 22–32)
Calcium: 8.3 mg/dL — ABNORMAL LOW (ref 8.9–10.3)
Chloride: 108 mmol/L (ref 98–111)
Creatinine, Ser: 2.85 mg/dL — ABNORMAL HIGH (ref 0.61–1.24)
GFR, Estimated: 23 mL/min — ABNORMAL LOW (ref >60)
Glucose, Bld: 88 mg/dL (ref 70–99)
Phosphorus: 5.1 mg/dL — ABNORMAL HIGH (ref 2.5–4.6)
Potassium: 4.9 mmol/L (ref 3.5–5.1)
Sodium: 138 mmol/L (ref 135–145)

## 2021-06-23 LAB — GLUCOSE, CAPILLARY
Glucose-Capillary: 70 mg/dL (ref 70–99)
Glucose-Capillary: 130 mg/dL — ABNORMAL HIGH (ref 70–99)

## 2021-06-23 MED ORDER — MOMETASONE FURO-FORMOTEROL FUM 200-5 MCG/ACT IN AERO
2.0000 | INHALATION_SPRAY | Freq: Two times a day (BID) | RESPIRATORY_TRACT | Status: DC
Start: 2021-06-23 — End: 2021-07-07

## 2021-06-23 MED ORDER — POLYETHYLENE GLYCOL 3350 17 G PO PACK: 17.0000 g | PACK | Freq: Every day | ORAL | 0 refills | Status: DC | PRN

## 2021-06-23 MED ORDER — TAMSULOSIN HCL 0.4 MG PO CAPS: 0.4000 mg | ORAL_CAPSULE | Freq: Every day | ORAL | Status: DC

## 2021-06-23 MED ORDER — BISACODYL 5 MG PO TBEC: 5.0000 mg | DELAYED_RELEASE_TABLET | Freq: Every day | ORAL | 0 refills | Status: DC | PRN

## 2021-06-23 MED ORDER — ALLOPURINOL 100 MG PO TABS: 100.0000 mg | ORAL_TABLET | ORAL | Status: DC | PRN

## 2021-06-23 MED ORDER — ASPIRIN 81 MG PO TBEC: 81.0000 mg | DELAYED_RELEASE_TABLET | Freq: Every day | ORAL | 11 refills | Status: DC

## 2021-06-23 MED ORDER — BACITRACIN-NEOMYCIN-POLYMYXIN OINTMENT TUBE: 1.0000 "application " | TOPICAL_OINTMENT | Freq: Two times a day (BID) | CUTANEOUS | Status: DC

## 2021-06-23 MED ORDER — PREDNISONE 10 MG (21) PO TBPK: ORAL_TABLET | ORAL | 0 refills | Status: AC

## 2021-06-23 MED ORDER — COLCHICINE 0.6 MG PO TABS: 0.3000 mg | ORAL_TABLET | Freq: Every day | ORAL | Status: DC

## 2021-06-23 MED ORDER — HYDRALAZINE HCL 10 MG PO TABS: 10.0000 mg | ORAL_TABLET | Freq: Three times a day (TID) | ORAL | Status: DC

## 2021-06-23 MED ORDER — IPRATROPIUM-ALBUTEROL 0.5-2.5 (3) MG/3ML IN SOLN
3.0000 mL | RESPIRATORY_TRACT | Status: DC | PRN
Start: 2021-06-23 — End: 2021-07-07

## 2021-06-23 MED ORDER — MELATONIN 5 MG PO TABS: 5.0000 mg | ORAL_TABLET | Freq: Every day | ORAL | 0 refills | Status: DC

## 2021-06-23 MED ORDER — APIXABAN 5 MG PO TABS: 5.0000 mg | ORAL_TABLET | Freq: Two times a day (BID) | ORAL | Status: DC

## 2021-06-23 MED ORDER — METOPROLOL TARTRATE 25 MG PO TABS: 25.0000 mg | ORAL_TABLET | Freq: Two times a day (BID) | ORAL | Status: DC

## 2021-06-23 MED ORDER — DILTIAZEM HCL ER COATED BEADS 300 MG PO CP24: 300.0000 mg | ORAL_CAPSULE | Freq: Every day | ORAL | Status: DC

## 2021-06-23 NOTE — Progress Notes (Signed)
[facility]  ROUNDING NOTE   Subjective:   Patient seen lying in bed, alert and oriented Tolerating meals IV fluids continue Denies shortness of breath, states he feels well today  Creatinine slightly improved Recorded urine output of 1.9 L on day shift yesterday.  Objective:  Vital signs in last 24 hours:  Temp:  [97.6 F (36.4 C)-98.3 F (36.8 C)] 97.8 F (36.6 C) (11/10 1137) Pulse Rate:  [55-98] 86 (11/10 1137) Resp:  [16-20] 19 (11/10 1137) BP: (78-156)/(58-124) 89/61 (11/10 1138) SpO2:  [90 %-99 %] 98 % (11/10 1137) Weight:  [132.4 kg] 132.4 kg (11/10 0554)  Weight change: -1.5 kg Filed Weights   06/21/21 0340 06/22/21 0312 06/23/21 0554  Weight: 133.6 kg 133.9 kg 132.4 kg    Intake/Output: I/O last 3 completed shifts: In: 1540 [P.O.:960; I.V.:389] Out: 2675 [Urine:2675]   Intake/Output this shift:  Total I/O In: 311.5 [P.O.:240; IV Piggyback:71.5] Out: 925 [Urine:925]  Physical Exam: General: NAD  Head: Normocephalic, atraumatic. Moist oral mucosal membranes  Eyes: Anicteric  Lungs:  Clear to auscultation, normal effort  Heart: Regular rate and rhythm  Abdomen:  Soft, nontender, obese  Extremities:  no peripheral edema.  Neurologic: Nonfocal, moving all four extremities  Skin: No lesions       Basic Metabolic Panel: Recent Labs  Lab 06/17/21 0924 06/17/21 2041 06/18/21 0414 06/19/21 0650 06/20/21 0430 06/21/21 0903 06/22/21 0330 06/23/21 0836  NA 134*   < > 134* 135 138 135 136 138  K 6.1*   < > 5.8* 5.2* 6.2* 4.6 4.7 4.9  CL 103   < > 103 103 109 101 105 108  CO2 22   < > 22 25 25 24  21* 22  GLUCOSE 148*   < > 111* 98 119* 109* 98 88  BUN 97*   < > 104* 100* 99* 90* 98* 80*  CREATININE 3.12*   < > 2.91* 3.00* 3.18* 3.11* 2.92* 2.85*  CALCIUM 8.6*   < > 8.3* 8.3* 9.2 8.5* 8.3* 8.3*  MG 2.8*  --  2.4  --   --   --   --   --   PHOS 5.2*  --  4.4  --   --  5.2* 5.0* 5.1*   < > = values in this interval not displayed.      Liver Function Tests: Recent Labs  Lab 06/21/21 0903 06/22/21 0330 06/23/21 0836  ALBUMIN 2.4* 2.4* 2.3*    No results for input(s): LIPASE, AMYLASE in the last 168 hours. No results for input(s): AMMONIA in the last 168 hours.  CBC: Recent Labs  Lab 06/17/21 0924 06/17/21 2047 06/18/21 0414  WBC 11.8* 15.5* 14.2*  NEUTROABS  --  13.8*  --   HGB 12.1* 12.1* 11.1*  HCT 38.3* 38.6* 35.9*  MCV 105.2* 104.0* 103.8*  PLT 335 354 295     Cardiac Enzymes: No results for input(s): CKTOTAL, CKMB, CKMBINDEX, TROPONINI in the last 168 hours.  BNP: Invalid input(s): POCBNP  CBG: Recent Labs  Lab 06/22/21 1132 06/22/21 1609 06/22/21 2031 06/23/21 0727 06/23/21 1136  GLUCAP 110* 173* 136* 70 130*     Microbiology: Results for orders placed or performed during the hospital encounter of 06/12/21  Resp Panel by RT-PCR (Flu A&B, Covid) Nasopharyngeal Swab     Status: None   Collection Time: 06/12/21  6:35 PM   Specimen: Nasopharyngeal Swab; Nasopharyngeal(NP) swabs in vial transport medium  Result Value Ref Range Status   SARS Coronavirus 2  by RT PCR NEGATIVE NEGATIVE Final    Comment: (NOTE) SARS-CoV-2 target nucleic acids are NOT DETECTED.  The SARS-CoV-2 RNA is generally detectable in upper respiratory specimens during the acute phase of infection. The lowest concentration of SARS-CoV-2 viral copies this assay can detect is 138 copies/mL. A negative result does not preclude SARS-Cov-2 infection and should not be used as the sole basis for treatment or other patient management decisions. A negative result may occur with  improper specimen collection/handling, submission of specimen other than nasopharyngeal swab, presence of viral mutation(s) within the areas targeted by this assay, and inadequate number of viral copies(<138 copies/mL). A negative result must be combined with clinical observations, patient history, and epidemiological information. The  expected result is Negative.  Fact Sheet for Patients:  EntrepreneurPulse.com.au  Fact Sheet for Healthcare Providers:  IncredibleEmployment.be  This test is no t yet approved or cleared by the [place] FDA and  has been authorized for detection and/or diagnosis of SARS-CoV-2 by FDA under an Emergency Use Authorization (EUA). This EUA will remain  in effect (meaning this test can be used) for the duration of the COVID-19 declaration under Section 564(b)(1) of the Act, 21 U.S.C.section 360bbb-3(b)(1), unless the authorization is terminated  or revoked sooner.       Influenza A by PCR NEGATIVE NEGATIVE Final   Influenza B by PCR NEGATIVE NEGATIVE Final    Comment: (NOTE) The Xpert Xpress SARS-CoV-2/FLU/RSV plus assay is intended as an aid in the diagnosis of influenza from Nasopharyngeal swab specimens and should not be used as a sole basis for treatment. Nasal washings and aspirates are unacceptable for Xpert Xpress SARS-CoV-2/FLU/RSV testing.  Fact Sheet for Patients: EntrepreneurPulse.com.au  Fact Sheet for Healthcare Providers: IncredibleEmployment.be  This test is not yet approved or cleared by the [place] FDA and has been authorized for detection and/or diagnosis of SARS-CoV-2 by FDA under an Emergency Use Authorization (EUA). This EUA will remain in effect (meaning this test can be used) for the duration of the COVID-19 declaration under Section 564(b)(1) of the Act, 21 U.S.C. section 360bbb-3(b)(1), unless the authorization is terminated or revoked.  Performed at [facility], [address] [place], [place] [address]   Blood culture (routine x 2)     Status: None   Collection Time: 06/12/21  7:48 PM   Specimen: BLOOD  Result Value Ref Range Status   Specimen Description BLOOD RAC  Final   Special Requests   Final    BOTTLES DRAWN AEROBIC AND ANAEROBIC Blood Culture  results may not be optimal due to an inadequate volume of blood received in culture bottles   Culture   Final    NO GROWTH 5 DAYS Performed at [facility], [address] [place], [place] [address]    Report Status 06/17/2021 FINAL  Final  Blood culture (routine x 2)     Status: None   Collection Time: 06/12/21  7:48 PM   Specimen: BLOOD  Result Value Ref Range Status   Specimen Description BLOOD BLOOD LEFT HAND  Final   Special Requests   Final    BOTTLES DRAWN AEROBIC AND ANAEROBIC Blood Culture results may not be optimal due to an inadequate volume of blood received in culture bottles   Culture   Final    NO GROWTH 5 DAYS Performed at [facility], [address], [place], [place] [address]    Report Status 06/17/2021 FINAL  Final  Resp Panel by RT-PCR (Flu A&B, Covid)  Nasopharyngeal Swab     Status: None   Collection Time: 06/23/21 10:20 AM   Specimen: Nasopharyngeal Swab; Nasopharyngeal(NP) swabs in vial transport medium  Result Value Ref Range Status   SARS Coronavirus 2 by RT PCR NEGATIVE NEGATIVE Final    Comment: (NOTE) SARS-CoV-2 target nucleic acids are NOT DETECTED.  The SARS-CoV-2 RNA is generally detectable in upper respiratory specimens during the acute phase of infection. The lowest concentration of SARS-CoV-2 viral copies this assay can detect is 138 copies/mL. A negative result does not preclude SARS-Cov-2 infection and should not be used as the sole basis for treatment or other patient management decisions. A negative result may occur with  improper specimen collection/handling, submission of specimen other than nasopharyngeal swab, presence of viral mutation(s) within the areas targeted by this assay, and inadequate number of viral copies(<138 copies/mL). A negative result must be combined with clinical observations, patient history, and epidemiological information. The expected result is Negative.  Fact Sheet for Patients:   EntrepreneurPulse.com.au  Fact Sheet for Healthcare Providers:  IncredibleEmployment.be  This test is no t yet approved or cleared by the [place] FDA and  has been authorized for detection and/or diagnosis of SARS-CoV-2 by FDA under an Emergency Use Authorization (EUA). This EUA will remain  in effect (meaning this test can be used) for the duration of the COVID-19 declaration under Section 564(b)(1) of the Act, 21 U.S.C.section 360bbb-3(b)(1), unless the authorization is terminated  or revoked sooner.       Influenza A by PCR NEGATIVE NEGATIVE Final   Influenza B by PCR NEGATIVE NEGATIVE Final    Comment: (NOTE) The Xpert Xpress SARS-CoV-2/FLU/RSV plus assay is intended as an aid in the diagnosis of influenza from Nasopharyngeal swab specimens and should not be used as a sole basis for treatment. Nasal washings and aspirates are unacceptable for Xpert Xpress SARS-CoV-2/FLU/RSV testing.  Fact Sheet for Patients: EntrepreneurPulse.com.au  Fact Sheet for Healthcare Providers: IncredibleEmployment.be  This test is not yet approved or cleared by the [place] FDA and has been authorized for detection and/or diagnosis of SARS-CoV-2 by FDA under an Emergency Use Authorization (EUA). This EUA will remain in effect (meaning this test can be used) for the duration of the COVID-19 declaration under Section 564(b)(1) of the Act, 21 U.S.C. section 360bbb-3(b)(1), unless the authorization is terminated or revoked.  Performed at [facility], [address] [place], [place] [address]     Coagulation Studies: No results for input(s): LABPROT, INR in the last 72 hours.  Urinalysis: No results for input(s): COLORURINE, LABSPEC, PHURINE, GLUCOSEU, HGBUR, BILIRUBINUR, KETONESUR, PROTEINUR, UROBILINOGEN, NITRITE, LEUKOCYTESUR in the last 72 hours.  Invalid input(s): APPERANCEUR    Imaging: No  results found.   Medications:    sodium chloride 50 mL/hr at 06/22/21 0315    apixaban  5 mg Oral BID   aspirin EC  81 mg Oral Daily   colchicine  0.3 mg Oral Daily   diltiazem  300 mg Oral Daily   famotidine  20 mg Oral Daily   hydrALAZINE  10 mg Oral Q8H   melatonin  5 mg Oral QHS   metoprolol tartrate  25 mg Oral BID   mometasone-formoterol  2 puff Inhalation BID   multivitamin with minerals  1 tablet Oral Daily   neomycin-bacitracin-polymyxin   Topical BID   predniSONE  10 mg Oral PC supper   [START ON 06/24/2021] predniSONE  10 mg Oral 3 x daily with food   [START ON 06/25/2021]  predniSONE  10 mg Oral 4X daily taper   predniSONE  10 mg Oral PC lunch   predniSONE  20 mg Oral Nightly   [START ON 06/24/2021] predniSONE  20 mg Oral Nightly   sodium chloride flush  3 mL Intravenous Q12H   tamsulosin  0.4 mg Oral QPC supper   acetaminophen **OR** acetaminophen, bisacodyl, ipratropium-albuterol, ondansetron (ZOFRAN) IV, oxyCODONE, polyethylene glycol  Assessment/ Plan:  [name] is a 70 y.o.  male with medical problems of hypertension, atrial fibrillation, chronic edema, LVH,   was admitted on 06/12/2021 for :SOB (shortness of breath) [R06.02] Bilateral leg edema [R60.0] Acute respiratory failure with hypoxia (HCC) [J96.01] Atrial fibrillation with RVR (HCC) [I48.91]   Acute kidney injury on chronic kidney disease stage IIIb Baseline creatinine 1.81/GFR 40 from December 04, 2020 The cause for AKI is not entirely clear but possibly related to hemodynamic instability from A. fib and RVR. Urinalysis at admission appears benign Renal imaging negative for obstruction.  Avoid all nephrotoxic agents and therapies if possible.  Avoid hypotension.   Creatinine continues to improve slowly.  Adequate urine output recorded.  Will need hospital follow-up with nephrology at discharge.   2. Chronic lower extremity edema Stable   3. Hyperkalemia/hyponatremia Likely from  AKI. Stable as AKI resolves    4. Hesitancy of urination Resolved with tamsulosin    LOS: [place] 11/10/20222:46 PM

## 2021-06-23 NOTE — Care Management Important Message (Signed)
Important Message  Patient Details  Name: Matthew [name] MRN: [id number] Date of Birth: 13-Jul-1951   Medicare Important Message Given:  Yes     [name] A Buford Gayler 06/23/2021, 11:33 AM

## 2021-06-23 NOTE — Discharge Summary (Signed)
Physician Discharge Summary  Patient ID: Matthew [name] MRN: [id number] DOB/AGE: [birth year] 71 y.o.  Admit date: 06/12/2021 Discharge date: 06/23/2021  Admission Diagnoses:shortness of breath  Discharge Diagnoses:  Principal Problem:   SOB (shortness of breath) Active Problems:   Moderate persistent asthma with exacerbation   Essential hypertension   COPD with acute exacerbation (HCC)   Bilateral leg edema   Atrial fibrillation with RVR (HCC)   Acute kidney injury superimposed on chronic kidney disease (HCC)   Elevated glucose level   Obesity (BMI 30-39.9)   Macrocytosis   Acute respiratory failure with hypoxia (HCC)   Acute gout of right ankle  Discharged Condition: good  Hospital Course: patient presented with shortness of breath which was thought to be contributed to by number of factors including COPD exacerbation and congestive heart failure with volume overload in the setting of A. fib with RVR.  RVR was responsive to metoprolol treatment as well as diltiazem drip.  In addition, he was treated with diuresis and breathing treatments.  Over the course of his stay, he was gradually weaned from oxygen and was able to tolerate room air with exertion and maintain his O2 saturations above 90% throughout the day.  However, patient continued to have desaturations during the nights when he was sleeping suggestive of OSA.  Patient was not tolerant of CPAP. For his COPD exacerbation, he was treated with steroids and the taper will continue at rehab facility.  He completed antibiotic therapy.  May continue to use DuoNebs as needed. For his A. fib and HFrEF, patient should continue metoprolol, Cardizem, Eliquis. Patient did experience AKI present on admission but resolved prior to discharge. Would recommend patient follow-up with sleep study, cardiology, PCP for the ulcer on right earlobe which was present on admission.  Would also recommend strict weight loss guidelines to improve his  respiratory status.  Consults: cardiology and nephrology  Significant Diagnostic Studies: echo- 50-55%. Negative DVT study  Treatments: diuresis, oxygen, breathing treatments  Discharge Exam: Blood pressure (!) 89/61, pulse 86, temperature 97.8 F (36.6 C), temperature source Oral, resp. rate 19, height 6\' 2"  (1.88 m), weight 132.4 kg, SpO2 98 %. Physical Exam Vitals and nursing note reviewed.  Constitutional:      General: He is not in acute distress.    Appearance: He is obese. He is not ill-appearing, toxic-appearing or diaphoretic.  Cardiovascular:     Rate and Rhythm: Normal rate and regular rhythm.     Heart sounds: No murmur heard. Pulmonary:     Effort: Pulmonary effort is normal.     Breath sounds: Normal breath sounds.  Abdominal:     Palpations: Abdomen is soft.     Tenderness: There is no abdominal tenderness.  Musculoskeletal:     Right lower leg: No edema.     Left lower leg: No edema.  Skin:    General: Skin is warm and dry.     Capillary Refill: Capillary refill takes less than 2 seconds.     Findings: Ecchymosis present.  Neurological:     General: No focal deficit present.     Mental Status: He is alert and oriented to person, place, and time.  Psychiatric:        Mood and Affect: Mood normal.        Behavior: Behavior normal.     Disposition: Discharge disposition: 03-Skilled [place]       Discharge Instructions     Discharge patient   Complete by: As directed  Discharge disposition: 03-Skilled Nursing Facility   Discharge patient date: 06/23/2021      Allergies as of 06/23/2021       Reactions   Demerol [meperidine Hcl]    Lisinopril Other (See Comments)   Hypotensive         Medication List     STOP taking these medications    albuterol 108 (90 Base) MCG/ACT inhaler Commonly known as: VENTOLIN HFA   amLODipine 10 MG tablet Commonly known as: NORVASC   gabapentin 300 MG capsule Commonly known as: NEURONTIN    lisinopril 10 MG tablet Commonly known as: ZESTRIL   meloxicam 15 MG tablet Commonly known as: MOBIC   methocarbamol 500 MG tablet Commonly known as: ROBAXIN   Serevent Diskus 50 MCG/ACT diskus inhaler Generic drug: salmeterol       TAKE these medications    allopurinol 100 MG tablet Commonly known as: ZYLOPRIM Take 1 tablet (100 mg total) by mouth as needed (gout).   apixaban 5 MG Tabs tablet Commonly known as: ELIQUIS Take 1 tablet (5 mg total) by mouth 2 (two) times daily.   aspirin 81 MG EC tablet Take 1 tablet (81 mg total) by mouth daily. Swallow whole. Start taking on: June 24, 2021   bisacodyl 5 MG EC tablet Commonly known as: DULCOLAX Take 1 tablet (5 mg total) by mouth daily as needed for moderate constipation.   colchicine 0.6 MG tablet Take 0.5 tablets (0.3 mg total) by mouth daily. Start taking on: June 24, 2021   diltiazem 300 MG 24 hr capsule Commonly known as: CARDIZEM CD Take 1 capsule (300 mg total) by mouth daily. Start taking on: June 24, 2021   DULoxetine 60 MG capsule Commonly known as: CYMBALTA Take 1 capsule (60 mg total) by mouth 2 (two) times daily.   hydrALAZINE 10 MG tablet Commonly known as: APRESOLINE Take 1 tablet (10 mg total) by mouth every 8 (eight) hours.   ipratropium-albuterol 0.5-2.5 (3) MG/3ML Soln Commonly known as: DUONEB Take 3 mLs by nebulization every 4 (four) hours as needed.   melatonin 5 MG Tabs Take 1 tablet (5 mg total) by mouth at bedtime.   metoprolol tartrate 25 MG tablet Commonly known as: LOPRESSOR Take 1 tablet (25 mg total) by mouth 2 (two) times daily.   mometasone-formoterol 200-5 MCG/ACT Aero Commonly known as: DULERA Inhale 2 puffs into the lungs 2 (two) times daily.   neomycin-bacitracin-polymyxin Oint Commonly known as: NEOSPORIN Apply 1 application topically 2 (two) times daily.   polyethylene glycol 17 g packet Commonly known as: MIRALAX / GLYCOLAX Take 17 g by mouth  daily as needed for mild constipation.   predniSONE 10 MG (21) Tbpk tablet Commonly known as: STERAPRED UNI-PAK 21 TAB Take 1 tablet (10 mg total) by mouth 2 (two) times daily for 1 day, THEN 1 tablet (10 mg total) daily for 3 days, THEN 1 tablet (10 mg total) every other day for 2 days. Start taking on: June 23, 2021   tamsulosin 0.4 MG Caps capsule Commonly known as: FLOMAX Take 1 capsule (0.4 mg total) by mouth daily after supper.        Contact information for after-discharge care     Destination     [facility] Preferred SNF .   Service: Skilled [facility]rop[facility] information: [address] [place] [phone]                     Signed: Carlynn Herald  [place] Boeding 06/23/2021, 12:17 PM

## 2021-06-23 NOTE — TOC Transition Note (Signed)
Transition of Care [facility]) - CM/SW Discharge Note   Patient Details  Name: Matthew [name] MRN: [id number] Date of Birth: 18-Mar-1951  [facility] CM/SW Contact:  Beverly Sessions, RN Phone Number: 06/23/2021, 1:05 PM   Clinical Narrative:    Patient will DC PN:PYYF Anticipated DC date:06/23/21  Family notified:Voicemail left for wife Transport by:EMS  Per MD patient ready for DC to . RN, patient, patient's family, and facility notified of DC. Discharge Summary sent to facility. RN given number for report. DC packet on chart. Ambulance transport requested for patient.  TOC signing off.  Matthew [name] [facility] [phone]    Final next level of care: Skilled Nursing Facility Barriers to Discharge: Barriers Resolved   Patient Goals and CMS Choice Patient states their goals for this hospitalization and ongoing recovery are:: to go to SNF and get better   Choice offered to / list presented to : Patient  Discharge Placement              Patient chooses bed at: [facility] [place] Patient to be transferred to facility by: EMS Name of family member notified: VM left for wife Patient and family notified of of transfer: 06/23/21  Discharge Plan and Services In-house Referral: Clinical Social Work   Post Acute Care Choice: [place]                               Social Determinants of Health (SDOH) Interventions     Readmission Risk Interventions No flowsheet data found.

## 2021-06-24 DIAGNOSIS — I10 Essential (primary) hypertension: Secondary | ICD-10-CM | POA: Diagnosis not present

## 2021-06-24 DIAGNOSIS — S728X2S Other fracture of left femur, sequela: Secondary | ICD-10-CM | POA: Diagnosis not present

## 2021-06-24 DIAGNOSIS — G894 Chronic pain syndrome: Secondary | ICD-10-CM | POA: Diagnosis not present

## 2021-06-24 DIAGNOSIS — J45909 Unspecified asthma, uncomplicated: Secondary | ICD-10-CM | POA: Diagnosis not present

## 2021-06-27 ENCOUNTER — Ambulatory Visit: Payer: Medicare HMO | Admitting: Family

## 2021-06-27 DIAGNOSIS — J449 Chronic obstructive pulmonary disease, unspecified: Secondary | ICD-10-CM | POA: Diagnosis not present

## 2021-06-27 DIAGNOSIS — I10 Essential (primary) hypertension: Secondary | ICD-10-CM | POA: Diagnosis not present

## 2021-06-27 DIAGNOSIS — F32A Depression, unspecified: Secondary | ICD-10-CM | POA: Diagnosis not present

## 2021-06-27 DIAGNOSIS — G894 Chronic pain syndrome: Secondary | ICD-10-CM | POA: Diagnosis not present

## 2021-06-27 DIAGNOSIS — I509 Heart failure, unspecified: Secondary | ICD-10-CM | POA: Diagnosis not present

## 2021-06-28 DIAGNOSIS — I10 Essential (primary) hypertension: Secondary | ICD-10-CM | POA: Diagnosis not present

## 2021-06-28 DIAGNOSIS — I509 Heart failure, unspecified: Secondary | ICD-10-CM | POA: Diagnosis not present

## 2021-06-28 DIAGNOSIS — G4700 Insomnia, unspecified: Secondary | ICD-10-CM | POA: Diagnosis not present

## 2021-06-28 DIAGNOSIS — F32A Depression, unspecified: Secondary | ICD-10-CM | POA: Diagnosis not present

## 2021-06-30 DIAGNOSIS — F32A Depression, unspecified: Secondary | ICD-10-CM | POA: Diagnosis not present

## 2021-06-30 DIAGNOSIS — I509 Heart failure, unspecified: Secondary | ICD-10-CM | POA: Diagnosis not present

## 2021-06-30 DIAGNOSIS — I10 Essential (primary) hypertension: Secondary | ICD-10-CM | POA: Diagnosis not present

## 2021-06-30 DIAGNOSIS — J449 Chronic obstructive pulmonary disease, unspecified: Secondary | ICD-10-CM | POA: Diagnosis not present

## 2021-06-30 DIAGNOSIS — I4891 Unspecified atrial fibrillation: Secondary | ICD-10-CM | POA: Diagnosis not present

## 2021-07-03 ENCOUNTER — Emergency Department: Payer: Medicare HMO

## 2021-07-03 ENCOUNTER — Inpatient Hospital Stay
Admission: EM | Admit: 2021-07-03 | Discharge: 2021-07-07 | DRG: 202 | Disposition: A | Discharge status: Home / Self Care | Payer: Medicare HMO | Attending: Hospitalist | Admitting: Hospitalist

## 2021-07-03 ENCOUNTER — Other Ambulatory Visit: Payer: Self-pay

## 2021-07-03 DIAGNOSIS — I5032 Chronic diastolic (congestive) heart failure: Secondary | ICD-10-CM | POA: Diagnosis not present

## 2021-07-03 DIAGNOSIS — Z808 Family history of malignant neoplasm of other organs or systems: Secondary | ICD-10-CM

## 2021-07-03 DIAGNOSIS — Z825 Family history of asthma and other chronic lower respiratory diseases: Secondary | ICD-10-CM | POA: Diagnosis not present

## 2021-07-03 DIAGNOSIS — J4541 Moderate persistent asthma with (acute) exacerbation: Secondary | ICD-10-CM | POA: Diagnosis not present

## 2021-07-03 DIAGNOSIS — Z7982 Long term (current) use of aspirin: Secondary | ICD-10-CM | POA: Diagnosis not present

## 2021-07-03 DIAGNOSIS — N1832 Chronic kidney disease, stage 3b: Secondary | ICD-10-CM | POA: Diagnosis present

## 2021-07-03 DIAGNOSIS — N179 Acute kidney failure, unspecified: Secondary | ICD-10-CM | POA: Diagnosis not present

## 2021-07-03 DIAGNOSIS — J4551 Severe persistent asthma with (acute) exacerbation: Secondary | ICD-10-CM

## 2021-07-03 DIAGNOSIS — J9601 Acute respiratory failure with hypoxia: Secondary | ICD-10-CM | POA: Diagnosis present

## 2021-07-03 DIAGNOSIS — R0789 Other chest pain: Secondary | ICD-10-CM

## 2021-07-03 DIAGNOSIS — I10 Essential (primary) hypertension: Secondary | ICD-10-CM | POA: Diagnosis not present

## 2021-07-03 DIAGNOSIS — R079 Chest pain, unspecified: Secondary | ICD-10-CM

## 2021-07-03 DIAGNOSIS — E669 Obesity, unspecified: Secondary | ICD-10-CM | POA: Diagnosis not present

## 2021-07-03 DIAGNOSIS — Z888 Allergy status to other drugs, medicaments and biological substances status: Secondary | ICD-10-CM

## 2021-07-03 DIAGNOSIS — I2510 Atherosclerotic heart disease of native coronary artery without angina pectoris: Secondary | ICD-10-CM | POA: Diagnosis present

## 2021-07-03 DIAGNOSIS — J45901 Unspecified asthma with (acute) exacerbation: Secondary | ICD-10-CM | POA: Diagnosis not present

## 2021-07-03 DIAGNOSIS — E875 Hyperkalemia: Secondary | ICD-10-CM | POA: Diagnosis present

## 2021-07-03 DIAGNOSIS — Z9114 Patient's other noncompliance with medication regimen: Secondary | ICD-10-CM

## 2021-07-03 DIAGNOSIS — I482 Chronic atrial fibrillation, unspecified: Secondary | ICD-10-CM | POA: Diagnosis present

## 2021-07-03 DIAGNOSIS — R296 Repeated falls: Secondary | ICD-10-CM | POA: Diagnosis not present

## 2021-07-03 DIAGNOSIS — Z23 Encounter for immunization: Secondary | ICD-10-CM | POA: Diagnosis not present

## 2021-07-03 DIAGNOSIS — Z885 Allergy status to narcotic agent status: Secondary | ICD-10-CM

## 2021-07-03 DIAGNOSIS — I517 Cardiomegaly: Secondary | ICD-10-CM | POA: Diagnosis not present

## 2021-07-03 DIAGNOSIS — I130 Hypertensive heart and chronic kidney disease with heart failure and stage 1 through stage 4 chronic kidney disease, or unspecified chronic kidney disease: Secondary | ICD-10-CM | POA: Diagnosis present

## 2021-07-03 DIAGNOSIS — J441 Chronic obstructive pulmonary disease with (acute) exacerbation: Secondary | ICD-10-CM | POA: Diagnosis not present

## 2021-07-03 DIAGNOSIS — D631 Anemia in chronic kidney disease: Secondary | ICD-10-CM | POA: Diagnosis not present

## 2021-07-03 DIAGNOSIS — Z79899 Other long term (current) drug therapy: Secondary | ICD-10-CM | POA: Diagnosis not present

## 2021-07-03 DIAGNOSIS — Z933 Colostomy status: Secondary | ICD-10-CM

## 2021-07-03 DIAGNOSIS — Z20822 Contact with and (suspected) exposure to covid-19: Secondary | ICD-10-CM | POA: Diagnosis present

## 2021-07-03 DIAGNOSIS — M79652 Pain in left thigh: Secondary | ICD-10-CM | POA: Diagnosis not present

## 2021-07-03 DIAGNOSIS — R0602 Shortness of breath: Secondary | ICD-10-CM | POA: Diagnosis not present

## 2021-07-03 DIAGNOSIS — R0689 Other abnormalities of breathing: Secondary | ICD-10-CM | POA: Diagnosis not present

## 2021-07-03 DIAGNOSIS — I5031 Acute diastolic (congestive) heart failure: Secondary | ICD-10-CM | POA: Diagnosis not present

## 2021-07-03 DIAGNOSIS — Z7951 Long term (current) use of inhaled steroids: Secondary | ICD-10-CM

## 2021-07-03 DIAGNOSIS — J449 Chronic obstructive pulmonary disease, unspecified: Secondary | ICD-10-CM | POA: Diagnosis present

## 2021-07-03 DIAGNOSIS — J90 Pleural effusion, not elsewhere classified: Secondary | ICD-10-CM | POA: Diagnosis not present

## 2021-07-03 DIAGNOSIS — R52 Pain, unspecified: Secondary | ICD-10-CM | POA: Diagnosis not present

## 2021-07-03 DIAGNOSIS — Z7901 Long term (current) use of anticoagulants: Secondary | ICD-10-CM | POA: Diagnosis not present

## 2021-07-03 DIAGNOSIS — Z91148 Patient's other noncompliance with medication regimen for other reason: Secondary | ICD-10-CM

## 2021-07-03 DIAGNOSIS — J9621 Acute and chronic respiratory failure with hypoxia: Secondary | ICD-10-CM | POA: Diagnosis present

## 2021-07-03 DIAGNOSIS — Z96643 Presence of artificial hip joint, bilateral: Secondary | ICD-10-CM | POA: Diagnosis present

## 2021-07-03 DIAGNOSIS — I4891 Unspecified atrial fibrillation: Secondary | ICD-10-CM

## 2021-07-03 DIAGNOSIS — R000 Tachycardia, unspecified: Secondary | ICD-10-CM | POA: Diagnosis not present

## 2021-07-03 HISTORY — DX: Atherosclerotic heart disease of native coronary artery without angina pectoris: I25.10

## 2021-07-03 LAB — CBC
HCT: 28.3 % — ABNORMAL LOW (ref 39.0–52.0)
Hemoglobin: 8.7 g/dL — ABNORMAL LOW (ref 13.0–17.0)
MCH: 32.6 pg (ref 26.0–34.0)
MCHC: 30.7 g/dL (ref 30.0–36.0)
MCV: 106 fL — ABNORMAL HIGH (ref 80.0–100.0)
Platelets: 169 10*3/uL (ref 150–400)
RBC: 2.67 MIL/uL — ABNORMAL LOW (ref 4.22–5.81)
RDW: 14.2 % (ref 11.5–15.5)
WBC: 8.8 10*3/uL (ref 4.0–10.5)
nRBC: 0 % (ref 0.0–0.2)

## 2021-07-03 LAB — TROPONIN I (HIGH SENSITIVITY): Troponin I (High Sensitivity): 14 ng/L (ref <18)

## 2021-07-03 LAB — BASIC METABOLIC PANEL
Anion gap: 5 (ref 5–15)
BUN: 44 mg/dL — ABNORMAL HIGH (ref 8–23)
CO2: 23 mmol/L (ref 22–32)
Calcium: 8.7 mg/dL — ABNORMAL LOW (ref 8.9–10.3)
Chloride: 107 mmol/L (ref 98–111)
Creatinine, Ser: 2.21 mg/dL — ABNORMAL HIGH (ref 0.61–1.24)
GFR, Estimated: 31 mL/min — ABNORMAL LOW (ref >60)
Glucose, Bld: 111 mg/dL — ABNORMAL HIGH (ref 70–99)
Potassium: 5.6 mmol/L — ABNORMAL HIGH (ref 3.5–5.1)
Sodium: 135 mmol/L (ref 135–145)

## 2021-07-03 LAB — MAGNESIUM: Magnesium: 1.7 mg/dL (ref 1.7–2.4)

## 2021-07-03 LAB — BRAIN NATRIURETIC PEPTIDE: B Natriuretic Peptide: 420.7 pg/mL — ABNORMAL HIGH (ref 0.0–100.0)

## 2021-07-03 LAB — RESP PANEL BY RT-PCR (FLU A&B, COVID) ARPGX2
Influenza A by PCR: NEGATIVE
Influenza B by PCR: NEGATIVE
SARS Coronavirus 2 by RT PCR: NEGATIVE

## 2021-07-03 MED ORDER — DULOXETINE HCL 30 MG PO CPEP
60.0000 mg | ORAL_CAPSULE | Freq: Two times a day (BID) | ORAL | Status: DC
  Administered 2021-07-03 – 2021-07-07 (×8): 60 mg via ORAL
  Filled 2021-07-03: qty 2
  Filled 2021-07-03: qty 1
  Filled 2021-07-03 (×3): qty 2
  Filled 2021-07-03: qty 1
  Filled 2021-07-03 (×2): qty 2

## 2021-07-03 MED ORDER — DILTIAZEM HCL 25 MG/5ML IV SOLN
20.0000 mg | Freq: Once | INTRAVENOUS | Status: AC
  Administered 2021-07-03: 20 mg via INTRAVENOUS
  Filled 2021-07-03: qty 5

## 2021-07-03 MED ORDER — IPRATROPIUM-ALBUTEROL 0.5-2.5 (3) MG/3ML IN SOLN
3.0000 mL | Freq: Once | RESPIRATORY_TRACT | Status: AC
  Administered 2021-07-03: 3 mL via RESPIRATORY_TRACT
  Filled 2021-07-03: qty 3

## 2021-07-03 MED ORDER — METHYLPREDNISOLONE SODIUM SUCC 125 MG IJ SOLR
125.0000 mg | INTRAMUSCULAR | Status: AC
  Administered 2021-07-03: 125 mg via INTRAVENOUS
  Filled 2021-07-03: qty 2

## 2021-07-03 MED ORDER — PREDNISONE 20 MG PO TABS
40.0000 mg | ORAL_TABLET | Freq: Every day | ORAL | Status: AC
  Administered 2021-07-04 – 2021-07-07 (×4): 40 mg via ORAL
  Filled 2021-07-03: qty 2
  Filled 2021-07-03: qty 4
  Filled 2021-07-03 (×2): qty 2

## 2021-07-03 MED ORDER — METOPROLOL TARTRATE 5 MG/5ML IV SOLN: 5.0000 mg | Freq: Four times a day (QID) | INTRAVENOUS | Status: DC | PRN

## 2021-07-03 MED ORDER — DILTIAZEM HCL 60 MG PO TABS
60.0000 mg | ORAL_TABLET | Freq: Four times a day (QID) | ORAL | Status: DC
  Administered 2021-07-03 – 2021-07-04 (×3): 60 mg via ORAL
  Filled 2021-07-03 (×3): qty 1

## 2021-07-03 MED ORDER — ALBUTEROL SULFATE HFA 108 (90 BASE) MCG/ACT IN AERS
2.0000 | INHALATION_SPRAY | RESPIRATORY_TRACT | Status: DC | PRN
  Filled 2021-07-03: qty 6.7

## 2021-07-03 MED ORDER — MAGNESIUM SULFATE 2 GM/50ML IV SOLN
2.0000 g | Freq: Once | INTRAVENOUS | Status: AC
  Administered 2021-07-03: 2 g via INTRAVENOUS
  Filled 2021-07-03: qty 50

## 2021-07-03 MED ORDER — ALBUTEROL SULFATE (2.5 MG/3ML) 0.083% IN NEBU
5.0000 mg | INHALATION_SOLUTION | Freq: Once | RESPIRATORY_TRACT | Status: AC
  Administered 2021-07-03: 5 mg via RESPIRATORY_TRACT
  Filled 2021-07-03: qty 6

## 2021-07-03 MED ORDER — FUROSEMIDE 10 MG/ML IJ SOLN: 40.0000 mg | Freq: Once | INTRAMUSCULAR | Status: DC

## 2021-07-03 MED ORDER — SODIUM CHLORIDE 0.9% FLUSH
3.0000 mL | Freq: Two times a day (BID) | INTRAVENOUS | Status: DC
  Administered 2021-07-04 – 2021-07-07 (×7): 3 mL via INTRAVENOUS

## 2021-07-03 MED ORDER — LACTATED RINGERS IV BOLUS
1000.0000 mL | Freq: Once | INTRAVENOUS | Status: AC
  Administered 2021-07-03: 1000 mL via INTRAVENOUS

## 2021-07-03 MED ORDER — APIXABAN 5 MG PO TABS
5.0000 mg | ORAL_TABLET | Freq: Two times a day (BID) | ORAL | Status: DC
  Administered 2021-07-03 – 2021-07-07 (×8): 5 mg via ORAL
  Filled 2021-07-03 (×9): qty 1

## 2021-07-03 MED ORDER — TAMSULOSIN HCL 0.4 MG PO CAPS
0.4000 mg | ORAL_CAPSULE | Freq: Every day | ORAL | Status: DC
  Administered 2021-07-03 – 2021-07-06 (×4): 0.4 mg via ORAL
  Filled 2021-07-03 (×4): qty 1

## 2021-07-03 MED ORDER — IOHEXOL 350 MG/ML SOLN
60.0000 mL | Freq: Once | INTRAVENOUS | Status: AC | PRN
  Administered 2021-07-03: 60 mL via INTRAVENOUS

## 2021-07-03 MED ORDER — DILTIAZEM HCL 60 MG PO TABS
30.0000 mg | ORAL_TABLET | Freq: Four times a day (QID) | ORAL | Status: DC
Start: 2021-07-03 — End: 2021-07-03

## 2021-07-03 MED ORDER — MOMETASONE FURO-FORMOTEROL FUM 200-5 MCG/ACT IN AERO
2.0000 | INHALATION_SPRAY | Freq: Two times a day (BID) | RESPIRATORY_TRACT | Status: DC
  Administered 2021-07-04 – 2021-07-07 (×8): 2 via RESPIRATORY_TRACT
  Filled 2021-07-03 (×2): qty 8.8

## 2021-07-03 MED ORDER — ALBUTEROL SULFATE (2.5 MG/3ML) 0.083% IN NEBU
2.5000 mg | INHALATION_SOLUTION | RESPIRATORY_TRACT | Status: DC | PRN
  Administered 2021-07-04 – 2021-07-05 (×6): 2.5 mg via RESPIRATORY_TRACT
  Filled 2021-07-03 (×6): qty 3

## 2021-07-03 MED ORDER — POLYETHYLENE GLYCOL 3350 17 G PO PACK: 17.0000 g | PACK | Freq: Every day | ORAL | Status: DC | PRN

## 2021-07-03 MED ORDER — ALBUTEROL SULFATE (2.5 MG/3ML) 0.083% IN NEBU
2.5000 mg | INHALATION_SOLUTION | Freq: Four times a day (QID) | RESPIRATORY_TRACT | Status: DC
  Administered 2021-07-03 – 2021-07-04 (×4): 2.5 mg via RESPIRATORY_TRACT
  Filled 2021-07-03 (×4): qty 3

## 2021-07-03 NOTE — ED Provider Notes (Signed)
[facility] Emergency Department Provider Note  ____________________________________________  Time seen: Approximately 5:53 PM  I have reviewed the triage vital signs and the nursing notes.   HISTORY  Chief Complaint Shortness of Breath    HPI Matthew [name] is a 70 y.o. male with a past history of hypertension, COPD, atrial fibrillation who comes the ED due to shortness of breath that started yesterday, gradual onset, constant, no aggravating or alleviating factors.  Feels wheezy.  Denies cough.  He has associated chest pain which is dull, moderate in intensity, left anterior chest radiating to the shoulder.  Patient denies any black or bloody stool or recent bleeding.  He reports being noncompliant with his medications over the past couple of months.  He has not taking his diltiazem or metoprolol today.  Past Medical History:  Diagnosis Date   Asthma    Edema    Hip dislocation, bilateral ([place])    Hypertension      Patient Active Problem List   Diagnosis Date Noted   Acute gout of right ankle    Acute diastolic congestive heart failure (HCC)    Right leg pain    Weakness    SOB (shortness of breath) 06/12/2021   Bilateral leg edema 06/12/2021   Atrial fibrillation with RVR ([place]) 06/12/2021   AKI (acute kidney injury) ([place]) 06/12/2021   Elevated glucose level 06/12/2021   Obesity (BMI 30-39.9) 06/12/2021   Macrocytosis 06/12/2021   Acute respiratory failure with hypoxia ([place]) 06/12/2021   Chronic pain of left knee 12/16/2020   COPD with acute exacerbation ([place]) 12/04/2020   Acute right hip pain 12/04/2020   H/O bilateral hip replacements 12/04/2020   Stage 3b chronic kidney disease ([place]) 12/04/2020   Displaced comminuted fracture of shaft of left femur, initial encounter for closed fracture ([place]) 05/19/2020   Asthma 05/18/2020   Essential hypertension 05/18/2020   Femur fracture, left ([place]) 05/18/2020   Closed comminuted intra-articular  fracture of distal femur ([place]) 05/17/2020   Moderate persistent asthma with exacerbation 10/15/2018     Past Surgical History:  Procedure Laterality Date   ABDOMINAL SURGERY     CHOLECYSTECTOMY     COLOSTOMY     COLOSTOMY TAKEDOWN     HERNIA REPAIR     LEFT HEART CATH AND CORONARY ANGIOGRAPHY N/A 10/17/2018   Procedure: LEFT HEART CATH AND CORONARY ANGIOGRAPHY with possible PCI and stent;  Surgeon: Yolonda Kida, MD;  Location: [facility];  Service: Cardiovascular;  Laterality: N/A;   ORIF FEMUR FRACTURE Left 05/19/2020   Procedure: OPEN REDUCTION INTERNAL FIXATION (ORIF) DISTAL FEMUR FRACTURE;  Surgeon: Shona Needles, MD;  Location: [facility];  Service: Orthopedics;  Laterality: Left;   TOTAL HIP ARTHROPLASTY Bilateral      Prior to Admission medications   Medication Sig Start Date End Date Taking? Authorizing Provider  allopurinol (ZYLOPRIM) 100 MG tablet Take 1 tablet (100 mg total) by mouth as needed (gout). 06/23/21   Richarda Osmond, MD  apixaban (ELIQUIS) 5 MG TABS tablet Take 1 tablet (5 mg total) by mouth 2 (two) times daily. 06/23/21   Richarda Osmond, MD  aspirin EC 81 MG EC tablet Take 1 tablet (81 mg total) by mouth daily. Swallow whole. 06/24/21   Richarda Osmond, MD  bisacodyl (DULCOLAX) 5 MG EC tablet Take 1 tablet (5 mg total) by mouth daily as needed for moderate constipation. 06/23/21   Richarda Osmond, MD  colchicine 0.6 MG tablet Take  0.5 tablets (0.3 mg total) by mouth daily. 06/24/21   Richarda Osmond, MD  diltiazem (CARDIZEM CD) 300 MG 24 hr capsule Take 1 capsule (300 mg total) by mouth daily. 06/24/21   Richarda Osmond, MD  DULoxetine (CYMBALTA) 60 MG capsule Take 1 capsule (60 mg total) by mouth 2 (two) times daily. 06/15/21   [name], MD  hydrALAZINE (APRESOLINE) 10 MG tablet Take 1 tablet (10 mg total) by mouth every 8 (eight) hours. 06/23/21   Richarda Osmond, MD  ipratropium-albuterol (DUONEB) 0.5-2.5 (3) MG/3ML  SOLN Take 3 mLs by nebulization every 4 (four) hours as needed. 06/23/21   Richarda Osmond, MD  melatonin 5 MG TABS Take 1 tablet (5 mg total) by mouth at bedtime. 06/23/21   Richarda Osmond, MD  metoprolol tartrate (LOPRESSOR) 25 MG tablet Take 1 tablet (25 mg total) by mouth 2 (two) times daily. 06/23/21   Richarda Osmond, MD  mometasone-formoterol (DULERA) 200-5 MCG/ACT AERO Inhale 2 puffs into the lungs 2 (two) times daily. 06/23/21   Richarda Osmond, MD  neomycin-bacitracin-polymyxin (NEOSPORIN) OINT Apply 1 application topically 2 (two) times daily. 06/23/21   Richarda Osmond, MD  polyethylene glycol (MIRALAX / GLYCOLAX) 17 g packet Take 17 g by mouth daily as needed for mild constipation. 06/23/21   Richarda Osmond, MD  tamsulosin (FLOMAX) 0.4 MG CAPS capsule Take 1 capsule (0.4 mg total) by mouth daily after supper. 06/23/21   Richarda Osmond, MD     Allergies Demerol [meperidine hcl] and Lisinopril   Family History  Problem Relation Age of Onset   COPD Mother    Cancer Mother    Melanoma Father     Social History Social History   Tobacco Use   Smoking status: Never   Smokeless tobacco: Never  Vaping Use   Vaping Use: Never used  Substance Use Topics   Alcohol use: Not Currently    Review of Systems  Constitutional:   No fever or chills.  ENT:   No sore throat. No rhinorrhea. Cardiovascular:   Positive chest pain without syncope. Respiratory: Positive shortness of breath without cough. Gastrointestinal:   Negative for abdominal pain, vomiting and diarrhea.  Musculoskeletal:   Negative for focal pain or swelling All other systems reviewed and are negative except as documented above in ROS and HPI.  ____________________________________________   PHYSICAL EXAM:  VITAL SIGNS: ED Triage Vitals  Enc Vitals Group     BP 07/03/21 1529 108/77     Pulse Rate 07/03/21 1529 (!) 132     Resp 07/03/21 1529 (!) 24     Temp 07/03/21 1529 98.6  F (37 C)     Temp src --      SpO2 07/03/21 1529 93 %     Weight --      Height --      Head Circumference --      [place] --      Pain Score 07/03/21 1527 7     Pain Loc --      Pain Edu? --      Excl. in [place]? --     Vital signs reviewed, nursing assessments reviewed.   Constitutional:   Alert and oriented. Non-toxic appearance. Eyes:   Conjunctivae are normal. EOMI. PERRL. ENT      Head:   Normocephalic and atraumatic.      Nose:   Normal.      Mouth/Throat: Dry mucous membranes.  Neck:   No meningismus. Full ROM. Hematological/Lymphatic/Immunilogical:   No cervical lymphadenopathy. Cardiovascular:   Tachycardia heart rate 125-140, irregular rhythm. Symmetric bilateral radial and DP pulses.  No murmurs. Cap refill less than 2 seconds. Respiratory:   Tachypnea.  Prolonged expiratory phase.  Diffuse expiratory wheezing.  Quiet breath sounds.. Gastrointestinal:   Soft and nontender. Non distended. There is no CVA tenderness.  No rebound, rigidity, or guarding.  Rectal exam shows secretions only, Hemoccult negative Genitourinary:   deferred Musculoskeletal:   Normal range of motion in all extremities. No joint effusions.  No lower extremity tenderness.  No edema. Neurologic:   Normal speech and language.  Motor grossly intact. No acute focal neurologic deficits are appreciated.  Skin:    Skin is warm, dry and intact. No rash noted.  No petechiae, purpura, or bullae.  ____________________________________________    LABS (pertinent positives/negatives) (all labs ordered are listed, but only abnormal results are displayed) Labs Reviewed  CBC - Abnormal; Notable for the following components:      Result Value   RBC 2.67 (*)    Hemoglobin 8.7 (*)    HCT 28.3 (*)    MCV 106.0 (*)    All other components within normal limits  BASIC METABOLIC PANEL - Abnormal; Notable for the following components:   Potassium 5.6 (*)    Glucose, Bld 111 (*)    BUN 44 (*)     Creatinine, Ser 2.21 (*)    Calcium 8.7 (*)    GFR, Estimated 31 (*)    All other components within normal limits  BRAIN NATRIURETIC PEPTIDE - Abnormal; Notable for the following components:   B Natriuretic Peptide 420.7 (*)    All other components within normal limits  RESP PANEL BY RT-PCR (FLU A&B, COVID) ARPGX2  MAGNESIUM   ____________________________________________   EKG  Interpreted by me Atrial fibrillation with RVR, rate of 129.  Normal axis and intervals.  Normal QRS and ST segments.  T wave inversions in the lateral leads.  Unchanged compared to previous EKG on June 22, 2021.  ____________________________________________    RADIOLOGY  DG Chest 2 View  Result Date: 07/03/2021 CLINICAL DATA:  Shortness of breath EXAM: CHEST - 2 VIEW COMPARISON:  06/17/2021 FINDINGS: Cardiomegaly. Trace bilateral pleural effusions and or pleural thickening, unchanged. The visualized skeletal structures are unremarkable. IMPRESSION: 1.  Cardiomegaly. 2. Trace bilateral pleural effusions and or pleural thickening, unchanged. 3.  No acute appearing airspace opacity. Electronically Signed   By: Delanna Ahmadi M.D.   On: 07/03/2021 16:06   CT Angio Chest PE W and/or Wo Contrast  Result Date: 07/03/2021 CLINICAL DATA:  Chest pain, short of breath EXAM: CT ANGIOGRAPHY CHEST WITH CONTRAST TECHNIQUE: Multidetector CT imaging of the chest was performed using the standard protocol during bolus administration of intravenous contrast. Multiplanar CT image reconstructions and MIPs were obtained to evaluate the vascular anatomy. CONTRAST:  52mL OMNIPAQUE IOHEXOL 350 MG/ML SOLN COMPARISON:  07/03/2021, 08/13/2020 FINDINGS: Cardiovascular: This is a technically adequate evaluation of the pulmonary vasculature. No filling defects or pulmonary emboli. The heart is enlarged without pericardial effusion. Moderate atherosclerosis throughout the coronary vasculature. No evidence of thoracic aortic aneurysm or  dissection. Mild atherosclerosis of the aortic arch. Mediastinum/Nodes: No enlarged mediastinal, hilar, or axillary lymph nodes. Thyroid gland, trachea, and esophagus demonstrate no significant findings. Lungs/Pleura: Stable bibasilar subpleural scarring. Minimal hypoventilatory changes are seen at the lung bases. Trace bilateral pleural effusions, right greater than left. No acute airspace disease or pneumothorax.  Central airways are patent. Upper Abdomen: No acute abnormality. Musculoskeletal: No acute or destructive bony lesions. Reconstructed images demonstrate no additional findings. Review of the MIP images confirms the above findings. IMPRESSION: 1. No evidence of pulmonary embolus. 2. Cardiomegaly, with moderate coronary artery atherosclerosis. 3. Trace bilateral pleural effusions. 4.  Aortic Atherosclerosis (ICD10-I70.0). Electronically Signed   By: Randa Ngo M.D.   On: 07/03/2021 17:27   US Venous Img Lower Unilateral Left  Result Date: 07/03/2021 CLINICAL DATA:  Left thigh pain. EXAM: LEFT LOWER EXTREMITY VENOUS DOPPLER ULTRASOUND TECHNIQUE: Gray-scale sonography with compression, as well as color and duplex ultrasound, were performed to evaluate the deep venous system(s) from the level of the common femoral vein through the popliteal and proximal calf veins. COMPARISON:  None. FINDINGS: VENOUS Normal compressibility of the common femoral, superficial femoral, and popliteal veins, as well as the visualized calf veins. Visualized portions of profunda femoral vein and great saphenous vein unremarkable. No filling defects to suggest DVT on grayscale or color Doppler imaging. Doppler waveforms show normal direction of venous flow, normal respiratory plasticity and response to augmentation. Limited views of the contralateral common femoral vein are unremarkable. OTHER None. Limitations: none IMPRESSION: Negative. Electronically Signed   By: Audie Pinto M.D.   On: 07/03/2021 16:19     ____________________________________________   PROCEDURES .Critical Care Performed by: Carrie Mew, MD Authorized by: Carrie Mew, MD   Critical care provider statement:    Critical care time (minutes):  32   Critical care time was exclusive of:  Separately billable procedures and treating other patients   Critical care was necessary to treat or prevent imminent or life-threatening deterioration of the following conditions:  Respiratory failure, circulatory failure and cardiac failure   Critical care was time spent personally by me on the following activities:  Development of treatment plan with patient or surrogate, discussions with consultants, evaluation of patient's response to treatment, examination of patient, obtaining history from patient or surrogate, ordering and performing treatments and interventions, ordering and review of laboratory studies, ordering and review of radiographic studies, pulse oximetry, re-evaluation of patient's condition and review of old charts  ____________________________________________  DIFFERENTIAL DIAGNOSIS   Pneumonia, pleural effusion, COPD exacerbation, pulmonary embolism  CLINICAL IMPRESSION / ASSESSMENT AND PLAN / ED COURSE  Medications ordered in the ED: Medications  albuterol (VENTOLIN HFA) 108 (90 Base) MCG/ACT inhaler 2 puff (has no administration in time range)  diltiazem (CARDIZEM) injection 20 mg (has no administration in time range)  magnesium sulfate IVPB 2 g 50 mL (0 g Intravenous Stopped 07/03/21 1752)  ipratropium-albuterol (DUONEB) 0.5-2.5 (3) MG/3ML nebulizer solution 3 mL (3 mLs Nebulization Given 07/03/21 1703)  albuterol (PROVENTIL) (2.5 MG/3ML) 0.083% nebulizer solution 5 mg (5 mg Nebulization Given 07/03/21 1703)  lactated ringers bolus 1,000 mL (0 mLs Intravenous Stopped 07/03/21 1752)  methylPREDNISolone sodium succinate (SOLU-MEDROL) 125 mg/2 mL injection 125 mg (125 mg Intravenous Given 07/03/21 1703)   iohexol (OMNIPAQUE) 350 MG/ML injection 60 mL (60 mLs Intravenous Contrast Given 07/03/21 1643)    Pertinent labs & imaging results that were available during my care of the patient were reviewed by me and considered in my medical decision making (see chart for details).  Matthew [name] was evaluated in Emergency Department on 07/03/2021 for the symptoms described in the history of present illness. He was evaluated in the context of the global COVID-19 pandemic, which necessitated consideration that the patient might be at risk for infection with the SARS-CoV-2 virus that causes  COVID-19. Institutional protocols and algorithms that pertain to the evaluation of patients at risk for COVID-19 are in a state of rapid change based on information released by regulatory bodies including the CDC and federal and state organizations. These policies and algorithms were followed during the patient's care in the ED.   Patient presents with shortness of breath and chest pain.  He is in A. fib and tachycardic, likely due to medication noncompliance.  Exam does show some wheezing, but with his presentation, CT angiogram is necessary to rule out PE given clear lungs on the chest x-ray.  CT is negative, and on reassessment after magnesium steroids and bronchodilators, patient is still very symptomatic with persistent wheezing and prolonged expiratory phase.  Still in A. fib with RVR.  I will give a dose of IV diltiazem, and depending on response he may be transition to his oral medication versus diltiazem infusion.  Case discussed with the hospitalist for further management.      ____________________________________________   FINAL CLINICAL IMPRESSION(S) / ED DIAGNOSES    Final diagnoses:  COPD exacerbation ([place])  Atrial fibrillation with rapid ventricular response ([place])  Non compliance w medication regimen     ED Discharge Orders     None       Portions of this note were generated with dragon  dictation software. Dictation errors may occur despite best attempts at proofreading.    Carrie Mew, MD 07/03/21 1816

## 2021-07-03 NOTE — ED Notes (Signed)
First Nurse Note:  Patient in via [facility] from [facility]; call out for shortness of breath.  Per EMS, pt only complaint is of left leg pain.  EMS Vitals: BP 126/75 Room Air Sat 94% HR 126 CBG 185  18G PIV inserted PTA.

## 2021-07-03 NOTE — ED Triage Notes (Addendum)
Pt comes with c/o SOB that started about hour ago. Pt comes via EMS from [facility].  Pt states some left sided CP that radiates to shoulder.  Pt states some left thigh pain.  Pt has labored breathing noted and accessory muscle use noted. Pt has audible wheezing. Pt states he feels like he can't breath.  Current O2-93 RA, Pt placed on 2L [place] at this time.

## 2021-07-03 NOTE — Progress Notes (Deleted)
   Patient ID: Matthew [name], male    DOB: [birth year], 70 y.o.   MRN: [id number]  HPI  Matthew [name] is a 70 y/o male with a history of  Echo report from 06/13/21 reviewed and showed an EF of 50-55% along with mild LAE and trivial Matthew.   LHC done 10/17/2018 showed: Normal left ventricular function Normal coronaries  Admitted 06/12/21 due to    Review of Systems    Physical Exam  Assessment & Plan:  1: Chronic heart failure with preserved ejection fraction with structural changes (LAE)- - NYHA class

## 2021-07-03 NOTE — ED Provider Notes (Signed)
Emergency Medicine Provider Triage Evaluation Note  Matthew [name] , a 70 y.o. male  was evaluated in triage.  Pt complains of shortness of breath, some chest pain.  Patient states that symptoms began hour ago he has had some intermittent shortness of breath and weakness over the past couple of days.  Patient states that he has had multiple falls over the last month.  Denies headache, fevers or chills, nasal congestion, sore throat, cough.  He does also endorse some left thigh pain..  Review of Systems  Positive: Chest pain, shortness of breath, thigh pain Negative: Fevers or chills, URI symptoms, cough, abdominal complaints  Physical Exam  BP 108/77   Pulse (!) 132   Temp 98.6 F (37 C)   Resp (!) 24   SpO2 93%  Gen:   Awake, no distress   Resp:  Normal effort  MSK:   Moves extremities without difficulty  Other:    Medical Decision Making  Medically screening exam initiated at 3:31 PM.  Appropriate orders placed.  ALBERTUS CHIARELLI was informed that the remainder of the evaluation will be completed by another provider, this initial triage assessment does not replace that evaluation, and the importance of remaining in the ED until their evaluation is complete.  Patient with chest pain, shortness of breath, thigh pain.  Patient will have labs, chest x-ray, ultrasound of the left lower extremity   Matthew Minors Charline Bills, PA-C 07/03/21 1537    Blake Divine, MD 07/04/21 0006

## 2021-07-03 NOTE — H&P (Addendum)
History and Physical    Matthew [name] NWG:[id number] DOB: 1950/12/31 DOA: 07/03/2021  PCP: [name], MD  Patient coming from: home  Chief Complaint: shortness of breath  HPI: Matthew [name] is a 70 y.o. male with medical history significant of Asthma, HTN, Atrial Fibrillation who presents with 3-4 weeks of progressive dyspnea, exertional fatigue and lightheadedness. 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 Even lifting his equipment is wearing him out. He finds that he is often sleepy during the day and is extremely fatigued when doing any activity. He has been short of breath with minimal activity for weeks and is often gasping for breath. He has been using his albuterol inhaler upwards of 7 times daily for some time and has not been on maintenance therapy for his asthma for about a year. He reports that there have been several medication changes and so he has mostly stopped taking his medications being unsure about what he should take. He would like to establish with a new primary care doctor who is specifically trained in primary care.  On the day of admission, he developed leg pain, which was bothersome and prompted him to get evaluated. He was then found to have hypoxia to the 80s and was started on nasal cannula.  ED Course: In the ED, he was given IV methylprednisolone and also found to be in AF w/RVR, for which he was given IV diltiazem 20mg  and 1L IVF. He was admitted to Medicine for further management.  Review of Systems: As per HPI otherwise all other systems reviewed and are negative.   Past Medical History:  Diagnosis Date   Asthma    CAD (coronary artery disease)    Edema    Hip dislocation, bilateral ([place])    Hypertension     Past Surgical History:  Procedure Laterality Date   ABDOMINAL SURGERY     CHOLECYSTECTOMY     COLOSTOMY     COLOSTOMY TAKEDOWN     HERNIA REPAIR     LEFT HEART  CATH AND CORONARY ANGIOGRAPHY N/A 10/17/2018   Procedure: LEFT HEART CATH AND CORONARY ANGIOGRAPHY with possible PCI and stent;  Surgeon: Yolonda Kida, MD;  Location: [facility];  Service: Cardiovascular;  Laterality: N/A;   ORIF FEMUR FRACTURE Left 05/19/2020   Procedure: OPEN REDUCTION INTERNAL FIXATION (ORIF) DISTAL FEMUR FRACTURE;  Surgeon: Shona Needles, MD;  Location: [facility];  Service: Orthopedics;  Laterality: Left;   TOTAL HIP ARTHROPLASTY Bilateral     Social History  reports that he has never smoked. He has never used smokeless tobacco. He reports that he does not currently use alcohol. No history on file for drug use.  Allergies  Allergen Reactions   Demerol [Meperidine Hcl]    Lisinopril Other (See Comments)    Hypotensive     Family History  Problem Relation Age of Onset   COPD Mother    Cancer Mother    Melanoma Father     Prior to Admission medications   Medication Sig Start Date End Date Taking? Authorizing Provider  apixaban (ELIQUIS) 5 MG TABS tablet Take 1 tablet (5 mg total) by mouth 2 (two) times daily. 06/23/21  Yes Richarda Osmond, MD  aspirin EC 81 MG EC tablet Take 1 tablet (81 mg total) by mouth daily. Swallow whole. 06/24/21  Yes Richarda Osmond, MD  colchicine  0.6 MG tablet Take 0.5 tablets (0.3 mg total) by mouth daily. 06/24/21  Yes Richarda Osmond, MD  diltiazem (CARDIZEM CD) 300 MG 24 hr capsule Take 1 capsule (300 mg total) by mouth daily. 06/24/21  Yes Richarda Osmond, MD  DULoxetine (CYMBALTA) 60 MG capsule Take 1 capsule (60 mg total) by mouth 2 (two) times daily. 06/15/21  Yes Masoud, Viann Shove, MD  FLUoxetine (PROZAC) 20 MG capsule Take 20 mg by mouth daily.   Yes [provider]  hydrALAZINE (APRESOLINE) 10 MG tablet Take 1 tablet (10 mg total) by mouth every 8 (eight) hours. 06/23/21  Yes Richarda Osmond, MD  hydrOXYzine (VISTARIL) 25 MG capsule Take 25 mg by mouth 3 (three) times daily as needed.   Yes  [provider]  ipratropium-albuterol (DUONEB) 0.5-2.5 (3) MG/3ML SOLN Take 3 mLs by nebulization every 4 (four) hours as needed. 06/23/21  Yes Richarda Osmond, MD  melatonin 5 MG TABS Take 1 tablet (5 mg total) by mouth at bedtime. 06/23/21  Yes Richarda Osmond, MD  mirtazapine (REMERON) 15 MG tablet Take 15 mg by mouth at bedtime.   Yes [provider]  mometasone-formoterol (DULERA) 200-5 MCG/ACT AERO Inhale 2 puffs into the lungs 2 (two) times daily. 06/23/21  Yes Richarda Osmond, MD  tamsulosin (FLOMAX) 0.4 MG CAPS capsule Take 1 capsule (0.4 mg total) by mouth daily after supper. 06/23/21  Yes Richarda Osmond, MD  allopurinol (ZYLOPRIM) 100 MG tablet Take 1 tablet (100 mg total) by mouth as needed (gout). 06/23/21   Richarda Osmond, MD  bisacodyl (DULCOLAX) 5 MG EC tablet Take 1 tablet (5 mg total) by mouth daily as needed for moderate constipation. 06/23/21   Richarda Osmond, MD  metoprolol tartrate (LOPRESSOR) 25 MG tablet Take 1 tablet (25 mg total) by mouth 2 (two) times daily. 06/23/21   Richarda Osmond, MD  neomycin-bacitracin-polymyxin (NEOSPORIN) OINT Apply 1 application topically 2 (two) times daily. Patient not taking: Reported on 07/03/2021 06/23/21   Richarda Osmond, MD  polyethylene glycol (MIRALAX / GLYCOLAX) 17 g packet Take 17 g by mouth daily as needed for mild constipation. 06/23/21   Richarda Osmond, MD  predniSONE (DELTASONE) 10 MG tablet Take 10 mg by mouth daily. 06/23/21   [provider]    Physical Exam: Vitals:   07/03/21 1815 07/03/21 1830 07/03/21 1845 07/03/21 1900  BP:  (!) 146/106  135/86  Pulse: (!) 132 (!) 126 (!) 122 (!) 45  Resp: 19 (!) 21 20 (!) 21  Temp:      SpO2: 95% 94% 100% 91%    Constitutional: NAD, calm, comfortable Vitals:   07/03/21 1815 07/03/21 1830 07/03/21 1845 07/03/21 1900  BP:  (!) 146/106  135/86  Pulse: (!) 132 (!) 126 (!) 122 (!) 45  Resp: 19 (!) 21 20 (!) 21   Temp:      SpO2: 95% 94% 100% 91%   Eyes: pupils equal and round, lids and conjunctivae normal ENMT: Mucous membranes are dry. Fair to poor dentition.  Neck: normal, supple, no masses, no goiter Respiratory: diminished air entry, minimal wheezing, no rales. Normal respiratory effort. No accessory muscle use.  Cardiovascular: irregularly irregular, no murmurs / rubs / gallops. No extremity edema. 2+ pedal pulses.   Abdomen: no tenderness, no masses palpated.  Bowel sounds positive.  Musculoskeletal: no clubbing / cyanosis. No joint deformity upper and lower extremities. Good ROM, no contractures. Normal muscle tone.  Skin: +scattered  ecchymosis Neurologic: Sensation intact throughout Psychiatric: Normal judgment and insight. Alert and oriented x 3. Normal mood.    Labs on Admission: I have personally reviewed following labs and imaging studies  CBC: Recent Labs  Lab 07/03/21 1530  WBC 8.8  HGB 8.7*  HCT 28.3*  MCV 106.0*  PLT 409    Basic Metabolic Panel: Recent Labs  Lab 07/03/21 1530  NA 135  K 5.6*  CL 107  CO2 23  GLUCOSE 111*  BUN 44*  CREATININE 2.21*  CALCIUM 8.7*  MG 1.7    GFR: Estimated Creatinine Clearance: 44.4 mL/min (A) (by C-G formula based on SCr of 2.21 mg/dL (H)).  Liver Function Tests: No results for input(s): AST, ALT, ALKPHOS, BILITOT, PROT, ALBUMIN in the last 168 hours.  Urine analysis:    Component Value Date/Time   COLORURINE YELLOW (A) 06/13/2021 0319   APPEARANCEUR HAZY (A) 06/13/2021 0319   LABSPEC 1.010 06/13/2021 0319   PHURINE 5.0 06/13/2021 0319   GLUCOSEU NEGATIVE 06/13/2021 0319   HGBUR SMALL (A) 06/13/2021 0319   BILIRUBINUR NEGATIVE 06/13/2021 0319   KETONESUR NEGATIVE 06/13/2021 0319   PROTEINUR NEGATIVE 06/13/2021 0319   NITRITE NEGATIVE 06/13/2021 0319   LEUKOCYTESUR TRACE (A) 06/13/2021 0319    Radiological Exams on Admission: DG Chest 2 View  Result Date: 07/03/2021 CLINICAL DATA:  Shortness of breath EXAM:  CHEST - 2 VIEW COMPARISON:  06/17/2021 FINDINGS: Cardiomegaly. Trace bilateral pleural effusions and or pleural thickening, unchanged. The visualized skeletal structures are unremarkable. IMPRESSION: 1.  Cardiomegaly. 2. Trace bilateral pleural effusions and or pleural thickening, unchanged. 3.  No acute appearing airspace opacity. Electronically Signed   By: Delanna Ahmadi M.D.   On: 07/03/2021 16:06   CT Angio Chest PE W and/or Wo Contrast  Result Date: 07/03/2021 CLINICAL DATA:  Chest pain, short of breath EXAM: CT ANGIOGRAPHY CHEST WITH CONTRAST TECHNIQUE: Multidetector CT imaging of the chest was performed using the standard protocol during bolus administration of intravenous contrast. Multiplanar CT image reconstructions and MIPs were obtained to evaluate the vascular anatomy. CONTRAST:  66mL OMNIPAQUE IOHEXOL 350 MG/ML SOLN COMPARISON:  07/03/2021, 08/13/2020 FINDINGS: Cardiovascular: This is a technically adequate evaluation of the pulmonary vasculature. No filling defects or pulmonary emboli. The heart is enlarged without pericardial effusion. Moderate atherosclerosis throughout the coronary vasculature. No evidence of thoracic aortic aneurysm or dissection. Mild atherosclerosis of the aortic arch. Mediastinum/Nodes: No enlarged mediastinal, hilar, or axillary lymph nodes. Thyroid gland, trachea, and esophagus demonstrate no significant findings. Lungs/Pleura: Stable bibasilar subpleural scarring. Minimal hypoventilatory changes are seen at the lung bases. Trace bilateral pleural effusions, right greater than left. No acute airspace disease or pneumothorax. Central airways are patent. Upper Abdomen: No acute abnormality. Musculoskeletal: No acute or destructive bony lesions. Reconstructed images demonstrate no additional findings. Review of the MIP images confirms the above findings. IMPRESSION: 1. No evidence of pulmonary embolus. 2. Cardiomegaly, with moderate coronary artery atherosclerosis. 3. Trace  bilateral pleural effusions. 4.  Aortic Atherosclerosis (ICD10-I70.0). Electronically Signed   By: Randa Ngo M.D.   On: 07/03/2021 17:27   US Venous Img Lower Unilateral Left  Result Date: 07/03/2021 CLINICAL DATA:  Left thigh pain. EXAM: LEFT LOWER EXTREMITY VENOUS DOPPLER ULTRASOUND TECHNIQUE: Gray-scale sonography with compression, as well as color and duplex ultrasound, were performed to evaluate the deep venous system(s) from the level of the common femoral vein through the popliteal and proximal calf veins. COMPARISON:  None. FINDINGS: VENOUS Normal compressibility of the common femoral, superficial femoral,  and popliteal veins, as well as the visualized calf veins. Visualized portions of profunda femoral vein and great saphenous vein unremarkable. No filling defects to suggest DVT on grayscale or color Doppler imaging. Doppler waveforms show normal direction of venous flow, normal respiratory plasticity and response to augmentation. Limited views of the contralateral common femoral vein are unremarkable. OTHER None. Limitations: none IMPRESSION: Negative. Electronically Signed   By: Audie Pinto M.D.   On: 07/03/2021 16:19    EKG: Independently reviewed. C/w afib w/RVR  Assessment/Plan Principal Problem:   Asthma exacerbation Active Problems:   Essential hypertension   Stage 3b chronic kidney disease (HCC)   Atrial fibrillation with RVR (HCC)   Acute respiratory failure with hypoxia (HCC)   Chest pain   #Acute hypoxic respiratory failure #Asthma Exacerbation Patient presents w/several weeks of progressive dyspnea, exertional intolerance, lightheadedness and fatigue likely suggesting ongoing hypoxia. He was found to be 82% on room air and improved with 2L [place]. He has a fam hx of asthma and was diagnosed w/asthma, but does have significant 2nd hand smoke exposure as well. Flu/RSV/covid neg. - s/p Mg SO4 - c/w steroid burst x 5d - Restart Mometasone-formoterol; patient has not  been on maintenance therapy for 1 yr - albuterol nebs q6h + q2hprn   #Atrial Fibrillation w/RVR Likely worsened in the setting of hypoxia and asthma exacerbation. Improved w/IV dilt. - home regimen dilt 300mg  XR - will start w/ dilt 60mg  q6h w/uptitration as able and likely resume XR dilt when more stable - can use prn IV metop if Hrs sustained >140 - consider diuretic if needed  #Frequent falls Patient reports frequent falls, states he does not lift his feet how he should. Uses a walker inconsistently. - PT/OT - fall precautions  #Daytime somnolence #suspected OSA Patient reports a history of using CPAP, but his machine broke. He states that he was told he needed the mask but wasn't sure that the diagnosis was sleep apnea.  - will need repeat polysomnogram outpatient - monitor for hypercapnia, may consider CPAP trial while inpatient  #Chest pain Patient reports a periodic chest dullness that radiates across the precordium and is associated w/malaise. May be related to AF and hypoxia, Given normal coronaries seen on 2020 LHC, probably less likely obstructive CAD. CTPE w/o PE - troponin x 2 - will address underlying AF/asthma and reassess symptom burden  #HTN Patient unclear on what he takes at home, so he would like to start medications afresh.He was recently discontinued off of lisinopril and amlodipine. - Will start with diltiazem - given hyperkalemia and significant CKD, would hold on AA/ACE/ARB at this time; can consider hydral   #Obesity - Nutrition consult  #Polypharmacy Patient does not know what medications he is supposed to take and he would like to establish with a PCP, his current doctor is a subspecialist. Needs to establish new regimen, would recommend having patient discharge on home regimen and continue what he is taking inpatient at home. Would like to fill in [place]. Appreciate pharmacy assistance.  #CKD Unclear baseline, had recent AKI, but appears closer  to 1.8-2.2 range. - CTM, avoid nephrotoxins/NSAIDs - trend BMP  DVT prophylaxis: apixaban  Code Status:   Full code    Disposition Plan:   Patient is from:  home  Anticipated DC to:  Home vs SNF  Anticipated DC date:  11/22  Anticipated DC barriers: Possible SNF  Consults called:  PT/OT  Admission status:  inpatient   Severity of Illness: The appropriate  patient status for this patient is INPATIENT    Cecille Rubin MD [facility]   07/03/2021, 7:35 PM

## 2021-07-04 ENCOUNTER — Encounter: Payer: Self-pay | Admitting: Internal Medicine

## 2021-07-04 DIAGNOSIS — J9601 Acute respiratory failure with hypoxia: Secondary | ICD-10-CM | POA: Diagnosis not present

## 2021-07-04 DIAGNOSIS — J441 Chronic obstructive pulmonary disease with (acute) exacerbation: Secondary | ICD-10-CM

## 2021-07-04 DIAGNOSIS — N1832 Chronic kidney disease, stage 3b: Secondary | ICD-10-CM | POA: Diagnosis not present

## 2021-07-04 LAB — BASIC METABOLIC PANEL
Anion gap: 8 (ref 5–15)
BUN: 45 mg/dL — ABNORMAL HIGH (ref 8–23)
CO2: 23 mmol/L (ref 22–32)
Calcium: 8.8 mg/dL — ABNORMAL LOW (ref 8.9–10.3)
Chloride: 105 mmol/L (ref 98–111)
Creatinine, Ser: 2.27 mg/dL — ABNORMAL HIGH (ref 0.61–1.24)
GFR, Estimated: 30 mL/min — ABNORMAL LOW (ref >60)
Glucose, Bld: 128 mg/dL — ABNORMAL HIGH (ref 70–99)
Potassium: 5.1 mmol/L (ref 3.5–5.1)
Sodium: 136 mmol/L (ref 135–145)

## 2021-07-04 LAB — HIV ANTIBODY (ROUTINE TESTING W REFLEX): HIV Screen 4th Generation wRfx: NONREACTIVE

## 2021-07-04 LAB — MAGNESIUM: Magnesium: 2.3 mg/dL (ref 1.7–2.4)

## 2021-07-04 LAB — TROPONIN I (HIGH SENSITIVITY): Troponin I (High Sensitivity): 11 ng/L (ref <18)

## 2021-07-04 MED ORDER — ASPIRIN EC 81 MG PO TBEC
81.0000 mg | DELAYED_RELEASE_TABLET | Freq: Every day | ORAL | Status: DC
  Administered 2021-07-04 – 2021-07-07 (×4): 81 mg via ORAL
  Filled 2021-07-04 (×4): qty 1

## 2021-07-04 MED ORDER — GLUCERNA SHAKE PO LIQD
237.0000 mL | Freq: Two times a day (BID) | ORAL | Status: DC
  Administered 2021-07-04 – 2021-07-05 (×3): 237 mL via ORAL

## 2021-07-04 MED ORDER — ALBUTEROL SULFATE (2.5 MG/3ML) 0.083% IN NEBU
2.5000 mg | INHALATION_SOLUTION | Freq: Three times a day (TID) | RESPIRATORY_TRACT | Status: DC
  Administered 2021-07-04 – 2021-07-05 (×3): 2.5 mg via RESPIRATORY_TRACT
  Filled 2021-07-04 (×3): qty 3

## 2021-07-04 MED ORDER — ADULT MULTIVITAMIN W/MINERALS CH
1.0000 | ORAL_TABLET | Freq: Every day | ORAL | Status: DC
  Administered 2021-07-04 – 2021-07-07 (×4): 1 via ORAL
  Filled 2021-07-04 (×5): qty 1

## 2021-07-04 MED ORDER — METOPROLOL TARTRATE 25 MG PO TABS
25.0000 mg | ORAL_TABLET | Freq: Two times a day (BID) | ORAL | Status: DC
  Administered 2021-07-04 – 2021-07-07 (×7): 25 mg via ORAL
  Filled 2021-07-04 (×7): qty 1

## 2021-07-04 MED ORDER — MORPHINE SULFATE (PF) 2 MG/ML IV SOLN
2.0000 mg | INTRAVENOUS | Status: DC | PRN
  Administered 2021-07-04 (×2): 2 mg via INTRAVENOUS
  Filled 2021-07-04 (×2): qty 1

## 2021-07-04 MED ORDER — ENSURE MAX PROTEIN PO LIQD
11.0000 [oz_av] | Freq: Every day | ORAL | Status: DC
  Administered 2021-07-04 – 2021-07-05 (×2): 11 [oz_av] via ORAL
  Filled 2021-07-04: qty 330

## 2021-07-04 MED ORDER — ALLOPURINOL 100 MG PO TABS
100.0000 mg | ORAL_TABLET | Freq: Every day | ORAL | Status: DC | PRN
  Filled 2021-07-04: qty 1

## 2021-07-04 MED ORDER — ACETAMINOPHEN 325 MG PO TABS
650.0000 mg | ORAL_TABLET | ORAL | Status: DC | PRN
  Administered 2021-07-04 – 2021-07-07 (×6): 650 mg via ORAL
  Filled 2021-07-04 (×6): qty 2

## 2021-07-04 MED ORDER — DILTIAZEM HCL ER COATED BEADS 300 MG PO CP24
300.0000 mg | ORAL_CAPSULE | Freq: Every day | ORAL | Status: DC
  Administered 2021-07-04 – 2021-07-07 (×4): 300 mg via ORAL
  Filled 2021-07-04 (×4): qty 1

## 2021-07-04 NOTE — Evaluation (Signed)
Physical Therapy Evaluation Patient Details Name: Matthew [name] MRN: [id number] DOB: [birth year] Today's Date: 07/04/2021  History of Present Illness  70 y.o. male with medical history significant of Asthma, HTN, Atrial Fibrillation who presents with 3-4 weeks of progressive dyspnea, exertional fatigue and lightheadedness.  Clinical Impression  Pt received sitting upright in bed eating breakfast, agreeable to therapy. Pt lives with his wife in a single story home, has 5 STE and states his wife is physically able to assist as needed at home. He has a significant history of falls (>50 in the last 6 months). Pt performed bed mobility with SUP, STS and ambulation with CGA using a RW. Pt functional endurance is limited; he reported fatigue upon completion of 85ft of ambulation with decreased foot clearance becoming more significant with fatigue. Pt was on 4 L O2 with activity - SpO2 86% after ambulation, increased to 95% once sitting. Multiple low readings (low 70's) - PT noticed this correlated with pt hand in a tight fist and quickly corrected to high 90's once hand was relaxed. Pt returned to 3L O2 at end of session with SpO2 >90%. A-fib present throughout session, at rest and with activity. Would benefit from skilled PT to address above deficits and promote optimal return to PLOF. PT rec OPPT at d/c (patient prefers over [place]).       Recommendations for follow up therapy are one component of a multi-disciplinary discharge planning process, led by the attending physician.  Recommendations may be updated based on patient status, additional functional criteria and insurance authorization.  Follow Up Recommendations Outpatient PT    Assistance Recommended at Discharge Intermittent Supervision/Assistance  Functional Status Assessment Patient has had a recent decline in their functional status and demonstrates the ability to make significant improvements in function in a reasonable and predictable amount  of time.  Equipment Recommendations  None recommended by PT    Recommendations for Other Services       Precautions / Restrictions Precautions Precautions: Fall Restrictions Weight Bearing Restrictions: No      Mobility  Bed Mobility Overal bed mobility: Needs Assistance Bed Mobility: Supine to Sit;Sit to Supine     Supine to sit: Supervision Sit to supine: Supervision   General bed mobility comments: Increased time and effort    Transfers Overall transfer level: Needs assistance Equipment used: Rolling walker (2 wheels) Transfers: Sit to/from Stand Sit to Stand: Min guard;From elevated surface           General transfer comment: CGA to steady during STS from elevated ER bed. Pt did require multiple attempts first rep. 3 performed throughout session with improved performance each rep.    Ambulation/Gait Ambulation/Gait assistance: Min guard Gait Distance (Feet): 40 Feet Assistive device: Rolling walker (2 wheels) Gait Pattern/deviations: Step-through pattern;Decreased stride length;Trunk flexed Gait velocity: decreased     General Gait Details: Decreased foot clearance bilaterally. Pt relied on RW. Fatigue upon completion.  Stairs            Wheelchair Mobility    Modified Rankin (Stroke Patients Only)       Balance Overall balance assessment: Needs assistance Sitting-balance support: Bilateral upper extremity supported;Feet supported Sitting balance-Leahy Scale: Good     Standing balance support: Bilateral upper extremity supported;During functional activity;Reliant on assistive device for balance Standing balance-Leahy Scale: Poor Standing balance comment: Reliant on RW during ambulation.  Pertinent Vitals/Pain Pain Assessment: 0-10 Pain Score: 8  Pain Location: 8/10 bilateral hips. 6/10 upper chest. Pain Descriptors / Indicators: Aching;Sore;Sharp Pain Intervention(s): Limited activity within  patient's tolerance;Monitored during session;Repositioned;Patient requesting pain meds-RN notified    Home Living Family/patient expects to be discharged to:: Private residence Living Arrangements: Spouse/significant other Available Help at Discharge: Family Type of Home: Mobile home Home Access: Stairs to enter Entrance Stairs-Rails: Right;Left;Can reach both Entrance Stairs-Number of Steps: 5   Home Layout: One level Home Equipment: Conservation officer, nature (2 wheels);Cane - single point;BSC/3in1 Additional Comments: Typically uses RW    Prior Function Prior Level of Function : History of Falls (last six months);Independent/Modified Independent             Mobility Comments: Reports >50 falls in the last 6 months due to tripping (states he doesn't like to pick up his feet). Limited activity tolerance. Does not drive. ADLs Comments: Independent with ADLs; pt assists wife with cooking and cleaning.     Hand Dominance   Dominant Hand: Right    Extremity/Trunk Assessment   Upper Extremity Assessment Upper Extremity Assessment: Overall WFL for tasks assessed    Lower Extremity Assessment Lower Extremity Assessment: Generalized weakness (BLE)       Communication   Communication: No difficulties  Cognition Arousal/Alertness: Awake/alert Behavior During Therapy: WFL for tasks assessed/performed Overall Cognitive Status: Within Functional Limits for tasks assessed                                          General Comments General comments (skin integrity, edema, etc.): Pt on 4 L O2 - SpO2 86% after ambulation, increased to 95% once sitting. Multiple low readings (low 70's) - PT noticed this correlated with pt hand in a tight fist and quickly corrected to high 90's once hand was relaxed. A-fib present throughout session, at rest and with activity.    Exercises     Assessment/Plan    PT Assessment Patient needs continued PT services  PT Problem List Decreased  strength;Decreased balance;Decreased activity tolerance;Decreased mobility;Cardiopulmonary status limiting activity       PT Treatment Interventions Gait training;DME instruction;Therapeutic activities;Therapeutic exercise;Balance training;Patient/family education;Neuromuscular re-education    PT Goals (Current goals can be found in the Care Plan section)  Acute Rehab PT Goals Patient Stated Goal: to get stronger and walk better PT Goal Formulation: With patient Time For Goal Achievement: 07/18/21 Potential to Achieve Goals: Good    Frequency Min 2X/week   Barriers to discharge        Co-evaluation               AM-PAC PT "6 Clicks" Mobility  Outcome Measure Help needed turning from your back to your side while in a flat bed without using bedrails?: None Help needed moving from lying on your back to sitting on the side of a flat bed without using bedrails?: None Help needed moving to and from a bed to a chair (including a wheelchair)?: A Little Help needed standing up from a chair using your arms (e.g., wheelchair or bedside chair)?: A Little Help needed to walk in hospital room?: A Little Help needed climbing 3-5 steps with a railing? : A Lot 6 Click Score: 19    End of Session Equipment Utilized During Treatment: Gait belt;Oxygen Activity Tolerance: Patient limited by fatigue Patient left: in bed;with call bell/phone within reach Nurse Communication: Mobility  status;Patient requests pain meds;Precautions PT Visit Diagnosis: Muscle weakness (generalized) (M62.81);History of falling (Z91.81);Difficulty in walking, not elsewhere classified (R26.2);Repeated falls (R29.6);Unsteadiness on feet (R26.81)    Time: 4010-2725 PT Time Calculation (min) (ACUTE ONLY): 36 min   Charges:   PT Evaluation $PT Eval Moderate Complexity: 1 Mod PT Treatments $Gait Training: 8-22 mins $Therapeutic Activity: 8-22 mins        Patrina Levering PT, DPT 07/04/21 12:32  PM [phone]

## 2021-07-04 NOTE — Progress Notes (Signed)
PROGRESS NOTE    Matthew [name]  MVE:[id number] DOB: March 05, 1951 DOA: 07/03/2021 PCP: [name], MD   Chief complaint.  Shortness of breath. Brief Narrative:  Matthew [name] is a 70 y.o. male with medical history significant of Asthma, HTN, Atrial Fibrillation who presents with 3-4 weeks of progressive dyspnea, exertional fatigue and lightheadedness. He was found to have oxygen saturation of 82% on room air, started on oxygen.  He was also started on IV steroids for COPD exacerbation   Assessment & Plan:   Principal Problem:   Asthma exacerbation Active Problems:   Essential hypertension   Stage 3b chronic kidney disease (HCC)   Atrial fibrillation with RVR (HCC)   Acute respiratory failure with hypoxia (HCC)   Chest pain  Acute hypoxemic respiratory failure secondary to COPD exacerbation. COPD exacerbation. Patient does not have volume overload.  He has a mild elevation of BNP, associated with chronic kidney disease. Does not have bacterial infection. CT angiogram of the chest did not show PE, no pneumonia.  Trace bilateral pleural effusion. Bronchodilator and oral steroids.  Chronic kidney disease stage IIIb. Hyperkalemia. Renal function still stable, potassium has normalized.  Chronic atrial fibrillation with rapid ventricular response. Resume home dose diltiazem XR 300 mg daily.  Continue as needed IV metoprolol. Continue chronic anticoagulation.  Chest pain. Appears to be secondary to exacerbation of COPD.  Troponin negative.    DVT prophylaxis: Eliquis Code Status: Full Family Communication:  Disposition Plan:    Status is: Inpatient  Remains inpatient appropriate because: Severity of disease and hypoxemia.        I/O last 3 completed shifts: In: -  Out: 1200 [Urine:1200] No intake/output data recorded.     Consultants:  None  Procedures: None  Antimicrobials: None  Subjective: Patient still complaining short of breath with minimal  exertion.  Cough, nonproductive. Denies any fever chills  No additional chest pain today. Denies any dysuria hematuria No abdominal pain or nausea vomiting.  Objective: Vitals:   07/04/21 0534 07/04/21 0600 07/04/21 0630 07/04/21 0906  BP: (!) 117/97 135/88 140/86 (!) 142/97  Pulse:  89 90 96  Resp:  15 17 20   Temp:    97.9 F (36.6 C)  TempSrc:    Oral  SpO2:  98% 96% 92%    Intake/Output Summary (Last 24 hours) at 07/04/2021 1030 Last data filed at 07/04/2021 2836 Gross per 24 hour  Intake --  Output 1200 ml  Net -1200 ml   There were no vitals filed for this visit.  Examination:  General exam: Appears calm and comfortable  Respiratory system: Decreased breathing sounds. Respiratory effort normal. Cardiovascular system: Irregularly irregular, tachycardic.  No JVD, murmurs, rubs, gallops or clicks. No pedal edema. Gastrointestinal system: Abdomen is nondistended, soft and nontender. No organomegaly or masses felt. Normal bowel sounds heard. Central nervous system: Alert and oriented. No focal neurological deficits. Extremities: Symmetric 5 x 5 power. Skin: No rashes, lesions or ulcers Psychiatry:  Mood & affect appropriate.     Data Reviewed: I have personally reviewed following labs and imaging studies  CBC: Recent Labs  Lab 07/03/21 1530  WBC 8.8  HGB 8.7*  HCT 28.3*  MCV 106.0*  PLT 629   Basic Metabolic Panel: Recent Labs  Lab 07/03/21 1530 07/04/21 0715  NA 135 136  K 5.6* 5.1  CL 107 105  CO2 23 23  GLUCOSE 111* 128*  BUN 44* 45*  CREATININE 2.21* 2.27*  CALCIUM 8.7* 8.8*  MG 1.7 2.3  GFR: Estimated Creatinine Clearance: 43.2 mL/min (A) (by C-G formula based on SCr of 2.27 mg/dL (H)). Liver Function Tests: No results for input(s): AST, ALT, ALKPHOS, BILITOT, PROT, ALBUMIN in the last 168 hours. No results for input(s): LIPASE, AMYLASE in the last 168 hours. No results for input(s): AMMONIA in the last 168 hours. Coagulation Profile: No  results for input(s): INR, PROTIME in the last 168 hours. Cardiac Enzymes: No results for input(s): CKTOTAL, CKMB, CKMBINDEX, TROPONINI in the last 168 hours. BNP (last 3 results) No results for input(s): PROBNP in the last 8760 hours. HbA1C: No results for input(s): HGBA1C in the last 72 hours. CBG: No results for input(s): GLUCAP in the last 168 hours. Lipid Profile: No results for input(s): CHOL, HDL, LDLCALC, TRIG, CHOLHDL, LDLDIRECT in the last 72 hours. Thyroid Function Tests: No results for input(s): TSH, T4TOTAL, FREET4, T3FREE, THYROIDAB in the last 72 hours. Anemia Panel: No results for input(s): VITAMINB12, FOLATE, FERRITIN, TIBC, IRON, RETICCTPCT in the last 72 hours. Sepsis Labs: No results for input(s): PROCALCITON, LATICACIDVEN in the last 168 hours.  Recent Results (from the past 240 hour(s))  Resp Panel by RT-PCR (Flu A&B, Covid) Nasopharyngeal Swab     Status: None   Collection Time: 07/03/21  4:21 PM   Specimen: Nasopharyngeal Swab; Nasopharyngeal(NP) swabs in vial transport medium  Result Value Ref Range Status   SARS Coronavirus 2 by RT PCR NEGATIVE NEGATIVE Final    Comment: (NOTE) SARS-CoV-2 target nucleic acids are NOT DETECTED.  The SARS-CoV-2 RNA is generally detectable in upper respiratory specimens during the acute phase of infection. The lowest concentration of SARS-CoV-2 viral copies this assay can detect is 138 copies/mL. A negative result does not preclude SARS-Cov-2 infection and should not be used as the sole basis for treatment or other patient management decisions. A negative result may occur with  improper specimen collection/handling, submission of specimen other than nasopharyngeal swab, presence of viral mutation(s) within the areas targeted by this assay, and inadequate number of viral copies(<138 copies/mL). A negative result must be combined with clinical observations, patient history, and epidemiological information. The expected  result is Negative.  Fact Sheet for Patients:  EntrepreneurPulse.com.au  Fact Sheet for Healthcare Providers:  IncredibleEmployment.be  This test is no t yet approved or cleared by the [place] FDA and  has been authorized for detection and/or diagnosis of SARS-CoV-2 by FDA under an Emergency Use Authorization (EUA). This EUA will remain  in effect (meaning this test can be used) for the duration of the COVID-19 declaration under Section 564(b)(1) of the Act, 21 U.S.C.section 360bbb-3(b)(1), unless the authorization is terminated  or revoked sooner.       Influenza A by PCR NEGATIVE NEGATIVE Final   Influenza B by PCR NEGATIVE NEGATIVE Final    Comment: (NOTE) The Xpert Xpress SARS-CoV-2/FLU/RSV plus assay is intended as an aid in the diagnosis of influenza from Nasopharyngeal swab specimens and should not be used as a sole basis for treatment. Nasal washings and aspirates are unacceptable for Xpert Xpress SARS-CoV-2/FLU/RSV testing.  Fact Sheet for Patients: EntrepreneurPulse.com.au  Fact Sheet for Healthcare Providers: IncredibleEmployment.be  This test is not yet approved or cleared by the [place] FDA and has been authorized for detection and/or diagnosis of SARS-CoV-2 by FDA under an Emergency Use Authorization (EUA). This EUA will remain in effect (meaning this test can be used) for the duration of the COVID-19 declaration under Section 564(b)(1) of the Act, 21 U.S.C. section 360bbb-3(b)(1), unless the authorization  is terminated or revoked.  Performed at [facility], [address], [place], [place] [address]          Radiology Studies: DG Chest 2 View  Result Date: 07/03/2021 CLINICAL DATA:  Shortness of breath EXAM: CHEST - 2 VIEW COMPARISON:  06/17/2021 FINDINGS: Cardiomegaly. Trace bilateral pleural effusions and or pleural thickening, unchanged. The visualized  skeletal structures are unremarkable. IMPRESSION: 1.  Cardiomegaly. 2. Trace bilateral pleural effusions and or pleural thickening, unchanged. 3.  No acute appearing airspace opacity. Electronically Signed   By: Delanna Ahmadi M.D.   On: 07/03/2021 16:06   CT Angio Chest PE W and/or Wo Contrast  Result Date: 07/03/2021 CLINICAL DATA:  Chest pain, short of breath EXAM: CT ANGIOGRAPHY CHEST WITH CONTRAST TECHNIQUE: Multidetector CT imaging of the chest was performed using the standard protocol during bolus administration of intravenous contrast. Multiplanar CT image reconstructions and MIPs were obtained to evaluate the vascular anatomy. CONTRAST:  70mL OMNIPAQUE IOHEXOL 350 MG/ML SOLN COMPARISON:  07/03/2021, 08/13/2020 FINDINGS: Cardiovascular: This is a technically adequate evaluation of the pulmonary vasculature. No filling defects or pulmonary emboli. The heart is enlarged without pericardial effusion. Moderate atherosclerosis throughout the coronary vasculature. No evidence of thoracic aortic aneurysm or dissection. Mild atherosclerosis of the aortic arch. Mediastinum/Nodes: No enlarged mediastinal, hilar, or axillary lymph nodes. Thyroid gland, trachea, and esophagus demonstrate no significant findings. Lungs/Pleura: Stable bibasilar subpleural scarring. Minimal hypoventilatory changes are seen at the lung bases. Trace bilateral pleural effusions, right greater than left. No acute airspace disease or pneumothorax. Central airways are patent. Upper Abdomen: No acute abnormality. Musculoskeletal: No acute or destructive bony lesions. Reconstructed images demonstrate no additional findings. Review of the MIP images confirms the above findings. IMPRESSION: 1. No evidence of pulmonary embolus. 2. Cardiomegaly, with moderate coronary artery atherosclerosis. 3. Trace bilateral pleural effusions. 4.  Aortic Atherosclerosis (ICD10-I70.0). Electronically Signed   By: Randa Ngo M.D.   On: 07/03/2021 17:27   US  Venous Img Lower Unilateral Left  Result Date: 07/03/2021 CLINICAL DATA:  Left thigh pain. EXAM: LEFT LOWER EXTREMITY VENOUS DOPPLER ULTRASOUND TECHNIQUE: Gray-scale sonography with compression, as well as color and duplex ultrasound, were performed to evaluate the deep venous system(s) from the level of the common femoral vein through the popliteal and proximal calf veins. COMPARISON:  None. FINDINGS: VENOUS Normal compressibility of the common femoral, superficial femoral, and popliteal veins, as well as the visualized calf veins. Visualized portions of profunda femoral vein and great saphenous vein unremarkable. No filling defects to suggest DVT on grayscale or color Doppler imaging. Doppler waveforms show normal direction of venous flow, normal respiratory plasticity and response to augmentation. Limited views of the contralateral common femoral vein are unremarkable. OTHER None. Limitations: none IMPRESSION: Negative. Electronically Signed   By: Audie Pinto M.D.   On: 07/03/2021 16:19        Scheduled Meds:  albuterol  2.5 mg Nebulization Q6H   apixaban  5 mg Oral BID   aspirin EC  81 mg Oral Daily   diltiazem  300 mg Oral Daily   DULoxetine  60 mg Oral BID   feeding supplement (GLUCERNA SHAKE)  237 mL Oral BID BM   metoprolol tartrate  25 mg Oral BID   mometasone-formoterol  2 puff Inhalation BID   multivitamin with minerals  1 tablet Oral Daily   predniSONE  40 mg Oral Q breakfast   Ensure Max Protein  11 oz Oral QHS   sodium chloride flush  3 mL Intravenous Q12H   tamsulosin  0.4 mg Oral QPC supper   Continuous Infusions:   LOS: 1 day    Time spent: 32 minutes    Sharen Hones, MD [facility]   To contact the attending provider between 7A-7P or the covering provider during after hours 7P-7A, please log into the web site [email or web address] and access using universal [place] password for that web site. If you do not have the password, please call the hospital  operator.  07/04/2021, 10:30 AM

## 2021-07-04 NOTE — ED Notes (Signed)
Pt provided a sandwich box & water

## 2021-07-04 NOTE — ED Notes (Addendum)
Pt brought it to the attention of this RN a "black" scab on the posterior surface of his right ear from where he "picked at a skin cancer scab." Pt denies being diagnosed with skin cancer and has never had it evaluated by a provider. MD [facility]) notified and advised to notify the AM team to come and evaluate.

## 2021-07-04 NOTE — ED Notes (Signed)
Pt endorses pain- MD (Mansy) notified.

## 2021-07-04 NOTE — Progress Notes (Signed)
Initial Nutrition Assessment  DOCUMENTATION CODES:   Not applicable  INTERVENTION:   -Glucerna Shake po BID, each supplement provides 220 kcal and 10 grams of protein  -Ensure Max po daily, each supplement provides 150 kcal and 30 grams of protein -Double protein portions at meals -MVI with minerals daily  NUTRITION DIAGNOSIS:   Increased nutrient needs related to chronic illness (COPD) as evidenced by estimated needs.  GOAL:   Patient will meet greater than or equal to 90% of their needs  MONITOR:   PO intake, Supplement acceptance, Labs, Weight trends, Skin, I & O's  REASON FOR ASSESSMENT:   Consult Assessment of nutrition requirement/status  ASSESSMENT:   Matthew [name] is a 70 y.o. male with medical history significant of Asthma, HTN, Atrial Fibrillation who presents with 3-4 weeks of progressive dyspnea, exertional fatigue and lightheadedness.  Pt admitted with acute respiratory failure and asthma exacerbation.   Reviewed I/O's: -1.2 L x 24 hours  UOP: 1.2 L x 24 hours  Pt unavailable at time of visit. RD unable to obtain further nutrition-related history or complete nutrition-focused physical exam at this time.    Pt familiar to this RD from previous admissions.   Pt currently on a regular diet; no meal completions documented at this time.   Reviewed wt hx; pt wt has been stable over the past year.   Pt with increased nutritional needs and would benefit from addition of oral nutrition supplements.   Medications reviewed and include cardizem and prednisone.  Labs reviewed.    Diet Order:   Diet Order             Diet regular Room service appropriate? Yes; Fluid consistency: Thin  Diet effective now                   EDUCATION NEEDS:   No education needs have been identified at this time  Skin:  Skin Assessment: Skin Integrity Issues: Skin Integrity Issues:: Other (Comment) Other: MASD to buttocks  Last BM:  Unknown  Height:   Ht  Readings from Last 1 Encounters:  06/12/21 6\' 2"  (1.88 m)    Weight:   Wt Readings from Last 1 Encounters:  06/23/21 132.4 kg    Ideal Body Weight:  86.4 kg  BMI:  There is no height or weight on file to calculate BMI.  Estimated Nutritional Needs:   Kcal:  2400-2600  Protein:  130-145 grams  Fluid:  > 2 L    Loistine Chance, RD, LDN, [place] Registered Dietitian II Certified Diabetes Care and Education Specialist Please refer to [facility] for RD and/or RD on-call/weekend/after hours pager

## 2021-07-04 NOTE — Evaluation (Signed)
Occupational Therapy Evaluation Patient Details Name: Matthew [name] MRN: [id number] DOB: 12/05/1950 Today's Date: 07/04/2021   History of Present Illness 70 y.o. male with medical history significant of Asthma, HTN, Atrial Fibrillation who presents with 3-4 weeks of progressive dyspnea, exertional fatigue and lightheadedness.   Clinical Impression   Mr. Farrior presents with generalized weakness, limited endurance, impaired balance, shortness of breath, and 7/10 pain. He is A&O x 4, although displays some level of confusion, e.g., stating that reason he has come to hospital is pain, although medical record indicates he reported here 2/2 SOB. He reported to PT this AM that he had had 50+ falls in previous 6 months; he reports to OT this afternoon that he has had ~ 12 falls in previous 12 months. He states several times that "I feel like my memory is going." Pt endorses 7/10 pain in L LE. He is able to perform bed mobility and LB dressing w/o physical assist, but with increased time and effort. Standing balance with RW is fair-poor, w/ heavy reliance on UE support. Even with OOB mobility, pt's O2 sats remain in upper 90s, including during a period of ~ 5 minutes w/o supplemental O2. Recommend ongoing OT while hospitalized, with DC home with [place]. Pt endorses fear of falling and states that, if possible, he would prefer to have male OT and PT, as he believes they would be better able to prevent him from falling.     Recommendations for follow up therapy are one component of a multi-disciplinary discharge planning process, led by the attending physician.  Recommendations may be updated based on patient status, additional functional criteria and insurance authorization.   Follow Up Recommendations  Home health OT    Assistance Recommended at Discharge Intermittent Supervision/Assistance  Functional Status Assessment  Patient has had a recent decline in their functional status and demonstrates the  ability to make significant improvements in function in a reasonable and predictable amount of time.  Equipment Recommendations  None recommended by OT    Recommendations for Other Services       Precautions / Restrictions Precautions Precautions: Fall Precaution Comments: ck pulse oximetry when ambulating Restrictions Weight Bearing Restrictions: No      Mobility Bed Mobility Overal bed mobility: Needs Assistance Bed Mobility: Supine to Sit;Sit to Supine     Supine to sit: Min guard Sit to supine: Min guard   General bed mobility comments: Increased time and effort    Transfers Overall transfer level: Needs assistance Equipment used: Rolling walker (2 wheels) Transfers: Sit to/from Stand Sit to Stand: Min guard;From elevated surface          Lateral/Scoot Transfers: Supervision General transfer comment: Increased time, greatly increased effort, with rest breaks required. Able to scoot towards HOB from seated position with SUPV      Balance Overall balance assessment: Needs assistance Sitting-balance support: Bilateral upper extremity supported;Feet supported Sitting balance-Leahy Scale: Good     Standing balance support: Bilateral upper extremity supported;During functional activity;Reliant on assistive device for balance Standing balance-Leahy Scale: Poor Standing balance comment: limited standing balance endurance; heavy reliance on UE support                           ADL either performed or assessed with clinical judgement   ADL Overall ADL's : Needs assistance/impaired                     Lower Body Dressing:  Moderate assistance;Sitting/lateral leans                 General ADL Comments: anticipate Mod A for UB ADL in standing, with limited standing balance tolerance     Vision         Perception     Praxis      Pertinent Vitals/Pain Pain Assessment: 0-10 Pain Score: 7  Pain Location: L LE Pain Descriptors /  Indicators: Aching;Sore;Sharp Pain Intervention(s): Limited activity within patient's tolerance;Repositioned;Monitored during session;Patient requesting pain meds-RN notified     Hand Dominance Right   Extremity/Trunk Assessment Upper Extremity Assessment Upper Extremity Assessment: Overall WFL for tasks assessed   Lower Extremity Assessment Lower Extremity Assessment: Generalized weakness       Communication Communication Communication: No difficulties   Cognition Arousal/Alertness: Awake/alert Behavior During Therapy: WFL for tasks assessed/performed Overall Cognitive Status: No family/caregiver present to determine baseline cognitive functioning                                 General Comments: Oriented to self, time, place, but displays some confusion; repetitive comments     General Comments  Pt on 4 L O2 - SpO2 86% after ambulation, increased to 95% once sitting. Multiple low readings (low 70's) - PT noticed this correlated with pt hand in a tight fist and quickly corrected to high 90's once hand was relaxed. A-fib present throughout session, at rest and with activity.    Exercises Other Exercises Other Exercises: Bed mobility, dressing, sup<>sit, sit<>stand, grooming. Educ re: DC recs, falls prevention, pain mgmt techniques   Shoulder Instructions      Home Living Family/patient expects to be discharged to:: Private residence Living Arrangements: Spouse/significant other Available Help at Discharge: Family Type of Home: Mobile home Home Access: Stairs to enter Entrance Stairs-Number of Steps: 5 Entrance Stairs-Rails: Right;Left;Can reach both Home Layout: One level     Bathroom Shower/Tub: Walk-in shower;Tub/shower unit   Bathroom Toilet: Standard Bathroom Accessibility: No   Home Equipment: Conservation officer, nature (2 wheels);Cane - single point;BSC/3in1   Additional Comments: Typically uses RW      Prior Functioning/Environment Prior Level of  Function : History of Falls (last six months);Independent/Modified Independent             Mobility Comments: Pt reports to OT ~ 12 falls in previous 12 months (reported >50 falls in last 6 months to PT). Does not drive. Reports uses RW in community, scooter at grocery store, no AD w/in home ADLs Comments: Reports IND with ADLs; wife handles cooking, cleaning, driving        OT Problem List: Decreased strength;Decreased activity tolerance;Impaired balance (sitting and/or standing);Cardiopulmonary status limiting activity      OT Treatment/Interventions: Self-care/ADL training;Energy conservation;Therapeutic exercise;DME and/or AE instruction;Therapeutic activities;Balance training;Patient/family education    OT Goals(Current goals can be found in the care plan section) Acute Rehab OT Goals Patient Stated Goal: to not be in pain OT Goal Formulation: With patient Time For Goal Achievement: 07/18/21 Potential to Achieve Goals: Good ADL Goals Pt Will Perform Lower Body Dressing: with modified independence;sitting/lateral leans;with adaptive equipment (w/ AE as necessary) Pt Will Transfer to Toilet: with supervision;stand pivot transfer;regular height toilet Additional ADL Goal #1: Pt with identify/demonstrate 2+ fall prevention techniques  OT Frequency: Min 2X/week   Barriers to D/C:            Co-evaluation  AM-PAC OT "6 Clicks" Daily Activity     Outcome Measure Help from another person eating meals?: None Help from another person taking care of personal grooming?: A Lot Help from another person toileting, which includes using toliet, bedpan, or urinal?: A Lot Help from another person bathing (including washing, rinsing, drying)?: A Lot Help from another person to put on and taking off regular upper body clothing?: A Little Help from another person to put on and taking off regular lower body clothing?: A Lot 6 Click Score: 15   End of Session Equipment  Utilized During Treatment: Rolling walker (2 wheels)  Activity Tolerance: Patient tolerated treatment well;Patient limited by fatigue;Patient limited by pain Patient left: in bed;with call bell/phone within reach;with bed alarm set  OT Visit Diagnosis: Other abnormalities of gait and mobility (R26.89);History of falling (Z91.81);Pain Pain - Right/Left: Left Pain - part of body: Leg                Time: 1459-1526 OT Time Calculation (min): 27 min Charges:  OT General Charges $OT Visit: 1 Visit OT Evaluation $OT Eval Moderate Complexity: 1 Mod OT Treatments $Self Care/Home Management : 23-37 mins Josiah Lobo, PhD, MS, OTR/L 07/04/21, 3:47 PM

## 2021-07-04 NOTE — ED Notes (Signed)
Report given to Matthew RN.

## 2021-07-05 ENCOUNTER — Ambulatory Visit: Payer: Medicare HMO | Admitting: Family

## 2021-07-05 DIAGNOSIS — J4551 Severe persistent asthma with (acute) exacerbation: Secondary | ICD-10-CM | POA: Diagnosis not present

## 2021-07-05 DIAGNOSIS — I4891 Unspecified atrial fibrillation: Secondary | ICD-10-CM | POA: Diagnosis not present

## 2021-07-05 DIAGNOSIS — J9601 Acute respiratory failure with hypoxia: Secondary | ICD-10-CM | POA: Diagnosis not present

## 2021-07-05 LAB — CBC WITH DIFFERENTIAL/PLATELET
Abs Immature Granulocytes: 0.04 10*3/uL (ref 0.00–0.07)
Basophils Absolute: 0 10*3/uL (ref 0.0–0.1)
Basophils Relative: 0 %
Eosinophils Absolute: 0 10*3/uL (ref 0.0–0.5)
Eosinophils Relative: 0 %
HCT: 26.7 % — ABNORMAL LOW (ref 39.0–52.0)
Hemoglobin: 8.1 g/dL — ABNORMAL LOW (ref 13.0–17.0)
Immature Granulocytes: 1 %
Lymphocytes Relative: 6 %
Lymphs Abs: 0.5 10*3/uL — ABNORMAL LOW (ref 0.7–4.0)
MCH: 32.3 pg (ref 26.0–34.0)
MCHC: 30.3 g/dL (ref 30.0–36.0)
MCV: 106.4 fL — ABNORMAL HIGH (ref 80.0–100.0)
Monocytes Absolute: 0.3 10*3/uL (ref 0.1–1.0)
Monocytes Relative: 4 %
Neutro Abs: 6.9 10*3/uL (ref 1.7–7.7)
Neutrophils Relative %: 89 %
Platelets: 153 10*3/uL (ref 150–400)
RBC: 2.51 MIL/uL — ABNORMAL LOW (ref 4.22–5.81)
RDW: 14 % (ref 11.5–15.5)
WBC: 7.7 10*3/uL (ref 4.0–10.5)
nRBC: 0 % (ref 0.0–0.2)

## 2021-07-05 LAB — BASIC METABOLIC PANEL
Anion gap: 6 (ref 5–15)
BUN: 51 mg/dL — ABNORMAL HIGH (ref 8–23)
CO2: 26 mmol/L (ref 22–32)
Calcium: 9.2 mg/dL (ref 8.9–10.3)
Chloride: 108 mmol/L (ref 98–111)
Creatinine, Ser: 2.36 mg/dL — ABNORMAL HIGH (ref 0.61–1.24)
GFR, Estimated: 29 mL/min — ABNORMAL LOW (ref >60)
Glucose, Bld: 121 mg/dL — ABNORMAL HIGH (ref 70–99)
Potassium: 5.9 mmol/L — ABNORMAL HIGH (ref 3.5–5.1)
Sodium: 140 mmol/L (ref 135–145)

## 2021-07-05 LAB — IRON AND TIBC
Iron: 63 ug/dL (ref 45–182)
Saturation Ratios: 32 % (ref 17.9–39.5)
TIBC: 199 ug/dL — ABNORMAL LOW (ref 250–450)
UIBC: 136 ug/dL

## 2021-07-05 LAB — VITAMIN B12: Vitamin B-12: 337 pg/mL (ref 180–914)

## 2021-07-05 LAB — POTASSIUM: Potassium: 4.9 mmol/L (ref 3.5–5.1)

## 2021-07-05 LAB — MAGNESIUM: Magnesium: 2.3 mg/dL (ref 1.7–2.4)

## 2021-07-05 MED ORDER — OXYCODONE-ACETAMINOPHEN 5-325 MG PO TABS
1.0000 | ORAL_TABLET | Freq: Four times a day (QID) | ORAL | Status: DC | PRN
  Administered 2021-07-05 – 2021-07-07 (×5): 1 via ORAL
  Filled 2021-07-05 (×5): qty 1

## 2021-07-05 MED ORDER — IPRATROPIUM-ALBUTEROL 0.5-2.5 (3) MG/3ML IN SOLN
3.0000 mL | RESPIRATORY_TRACT | Status: DC
  Administered 2021-07-05 – 2021-07-06 (×7): 3 mL via RESPIRATORY_TRACT
  Filled 2021-07-05 (×7): qty 3

## 2021-07-05 MED ORDER — IPRATROPIUM-ALBUTEROL 0.5-2.5 (3) MG/3ML IN SOLN: 3.0000 mL | RESPIRATORY_TRACT | Status: DC | PRN

## 2021-07-05 MED ORDER — ALBUTEROL SULFATE (2.5 MG/3ML) 0.083% IN NEBU: 2.5000 mg | INHALATION_SOLUTION | RESPIRATORY_TRACT | Status: DC | PRN

## 2021-07-05 MED ORDER — SODIUM POLYSTYRENE SULFONATE 15 GM/60ML PO SUSP
45.0000 g | Freq: Once | ORAL | Status: AC
  Administered 2021-07-05: 45 g via ORAL
  Filled 2021-07-05: qty 180

## 2021-07-05 NOTE — Progress Notes (Addendum)
PROGRESS NOTE    Matthew [name]  ZOX:[id number] DOB: 1951-05-22 DOA: 07/03/2021 PCP: [name], MD   Chief complaint.  Shortness of breath. Brief Narrative:  Matthew [name] is a 70 y.o. male with medical history significant of Asthma, HTN, Atrial Fibrillation who presents with 3-4 weeks of progressive dyspnea, exertional fatigue and lightheadedness. He was found to have oxygen saturation of 82% on room air, started on oxygen.  He was also started on IV steroids for COPD exacerbation   Assessment & Plan:   Principal Problem:   Asthma exacerbation Active Problems:   Essential hypertension   COPD with acute exacerbation (HCC)   Stage 3b chronic kidney disease ([place])   Atrial fibrillation with RVR (HCC)   Acute respiratory failure with hypoxia (HCC)   Chest pain  Acute hypoxemic respiratory failure secondary to COPD exacerbation. COPD exacerbation. Patient does not have volume overload.  He has a mild elevation of BNP, associated with chronic kidney disease. Does not have bacterial infection. CT angiogram of the chest did not show PE, no pneumonia.  Trace bilateral pleural effusion. Patient to respiratory status is improving, continue to wean oxygen.  Continue all oral steroids and bronchodilator. Increase activity with PT.   Chronic kidney disease stage IIIb. Hyperkalemia. Anemia of chronic kidney disease. Renal function still stable, however, potassium went back up again.  We will give 45 g of Kayexalate.  Recheck potassium in the evening time. Hemoglobins are stable, no iron deficiency.   Chronic atrial fibrillation with rapid ventricular response. Resumed home dose diltiazem XR 300 mg daily.   Heart rate under control.   Chest pain. Appears to be secondary to exacerbation of COPD.  Troponin negative.  Discussed with the social worker, patient came from nursing home, but will go home with home care after discharge.    DVT prophylaxis: Eliquis Code Status:  Full Family Communication:  Disposition Plan:      Status is: Inpatient   Remains inpatient appropriate because: Severity of disease and hypoxemia.       I/O last 3 completed shifts: In: 243 [P.O.:240; I.V.:3] Out: 2475 [Urine:2475] Total I/O In: 240 [P.O.:240] Out: -       Subjective: Patient doing well today, shortness of breath is improving.  Still on 2 L oxygen. Denies any abdominal pain or nausea vomiting. No fever chills  No dysuria hematuria.  Objective: Vitals:   07/04/21 2029 07/05/21 0730 07/05/21 0751 07/05/21 0800  BP: 120/74  112/73   Pulse: 99  67   Resp: 16  19 17   Temp: 97.6 F (36.4 C)  97.6 F (36.4 C)   TempSrc: Oral  Oral   SpO2: 99% 96% 96%     Intake/Output Summary (Last 24 hours) at 07/05/2021 1116 Last data filed at 07/05/2021 1033 Gross per 24 hour  Intake 483 ml  Output 1275 ml  Net -792 ml   There were no vitals filed for this visit.  Examination:  General exam: Appears calm and comfortable  Respiratory system: Decreased breath sounds. Respiratory effort normal. Cardiovascular system: Irregular. No JVD, murmurs, rubs, gallops or clicks. No pedal edema. Gastrointestinal system: Abdomen is nondistended, soft and nontender. No organomegaly or masses felt. Normal bowel sounds heard. Central nervous system: Alert and oriented x3. No focal neurological deficits. Extremities: Symmetric 5 x 5 power. Skin: No rashes, lesions or ulcers Psychiatry:  Mood & affect appropriate.     Data Reviewed: I have personally reviewed following labs and imaging studies  CBC: Recent Labs  Lab 07/03/21 1530 07/05/21 0329  WBC 8.8 7.7  NEUTROABS  --  6.9  HGB 8.7* 8.1*  HCT 28.3* 26.7*  MCV 106.0* 106.4*  PLT 169 161   Basic Metabolic Panel: Recent Labs  Lab 07/03/21 1530 07/04/21 0715 07/05/21 0329  NA 135 136 140  K 5.6* 5.1 5.9*  CL 107 105 108  CO2 23 23 26   GLUCOSE 111* 128* 121*  BUN 44* 45* 51*  CREATININE 2.21* 2.27*  2.36*  CALCIUM 8.7* 8.8* 9.2  MG 1.7 2.3 2.3   GFR: Estimated Creatinine Clearance: 41.5 mL/min (A) (by C-G formula based on SCr of 2.36 mg/dL (H)). Liver Function Tests: No results for input(s): AST, ALT, ALKPHOS, BILITOT, PROT, ALBUMIN in the last 168 hours. No results for input(s): LIPASE, AMYLASE in the last 168 hours. No results for input(s): AMMONIA in the last 168 hours. Coagulation Profile: No results for input(s): INR, PROTIME in the last 168 hours. Cardiac Enzymes: No results for input(s): CKTOTAL, CKMB, CKMBINDEX, TROPONINI in the last 168 hours. BNP (last 3 results) No results for input(s): PROBNP in the last 8760 hours. HbA1C: No results for input(s): HGBA1C in the last 72 hours. CBG: No results for input(s): GLUCAP in the last 168 hours. Lipid Profile: No results for input(s): CHOL, HDL, LDLCALC, TRIG, CHOLHDL, LDLDIRECT in the last 72 hours. Thyroid Function Tests: No results for input(s): TSH, T4TOTAL, FREET4, T3FREE, THYROIDAB in the last 72 hours. Anemia Panel: Recent Labs    07/05/21 0329  TIBC 199*  IRON 63   Sepsis Labs: No results for input(s): PROCALCITON, LATICACIDVEN in the last 168 hours.  Recent Results (from the past 240 hour(s))  Resp Panel by RT-PCR (Flu A&B, Covid) Nasopharyngeal Swab     Status: None   Collection Time: 07/03/21  4:21 PM   Specimen: Nasopharyngeal Swab; Nasopharyngeal(NP) swabs in vial transport medium  Result Value Ref Range Status   SARS Coronavirus 2 by RT PCR NEGATIVE NEGATIVE Final    Comment: (NOTE) SARS-CoV-2 target nucleic acids are NOT DETECTED.  The SARS-CoV-2 RNA is generally detectable in upper respiratory specimens during the acute phase of infection. The lowest concentration of SARS-CoV-2 viral copies this assay can detect is 138 copies/mL. A negative result does not preclude SARS-Cov-2 infection and should not be used as the sole basis for treatment or other patient management decisions. A negative result  may occur with  improper specimen collection/handling, submission of specimen other than nasopharyngeal swab, presence of viral mutation(s) within the areas targeted by this assay, and inadequate number of viral copies(<138 copies/mL). A negative result must be combined with clinical observations, patient history, and epidemiological information. The expected result is Negative.  Fact Sheet for Patients:  EntrepreneurPulse.com.au  Fact Sheet for Healthcare Providers:  IncredibleEmployment.be  This test is no t yet approved or cleared by the [place] FDA and  has been authorized for detection and/or diagnosis of SARS-CoV-2 by FDA under an Emergency Use Authorization (EUA). This EUA will remain  in effect (meaning this test can be used) for the duration of the COVID-19 declaration under Section 564(b)(1) of the Act, 21 U.S.C.section 360bbb-3(b)(1), unless the authorization is terminated  or revoked sooner.       Influenza A by PCR NEGATIVE NEGATIVE Final   Influenza B by PCR NEGATIVE NEGATIVE Final    Comment: (NOTE) The Xpert Xpress SARS-CoV-2/FLU/RSV plus assay is intended as an aid in the diagnosis of influenza from Nasopharyngeal swab specimens and should not be used as  a sole basis for treatment. Nasal washings and aspirates are unacceptable for Xpert Xpress SARS-CoV-2/FLU/RSV testing.  Fact Sheet for Patients: EntrepreneurPulse.com.au  Fact Sheet for Healthcare Providers: IncredibleEmployment.be  This test is not yet approved or cleared by the [place] FDA and has been authorized for detection and/or diagnosis of SARS-CoV-2 by FDA under an Emergency Use Authorization (EUA). This EUA will remain in effect (meaning this test can be used) for the duration of the COVID-19 declaration under Section 564(b)(1) of the Act, 21 U.S.C. section 360bbb-3(b)(1), unless the authorization is terminated  or revoked.  Performed at [facility], [address], [place], [place] [address]          Radiology Studies: DG Chest 2 View  Result Date: 07/03/2021 CLINICAL DATA:  Shortness of breath EXAM: CHEST - 2 VIEW COMPARISON:  06/17/2021 FINDINGS: Cardiomegaly. Trace bilateral pleural effusions and or pleural thickening, unchanged. The visualized skeletal structures are unremarkable. IMPRESSION: 1.  Cardiomegaly. 2. Trace bilateral pleural effusions and or pleural thickening, unchanged. 3.  No acute appearing airspace opacity. Electronically Signed   By: Delanna Ahmadi M.D.   On: 07/03/2021 16:06   CT Angio Chest PE W and/or Wo Contrast  Result Date: 07/03/2021 CLINICAL DATA:  Chest pain, short of breath EXAM: CT ANGIOGRAPHY CHEST WITH CONTRAST TECHNIQUE: Multidetector CT imaging of the chest was performed using the standard protocol during bolus administration of intravenous contrast. Multiplanar CT image reconstructions and MIPs were obtained to evaluate the vascular anatomy. CONTRAST:  24mL OMNIPAQUE IOHEXOL 350 MG/ML SOLN COMPARISON:  07/03/2021, 08/13/2020 FINDINGS: Cardiovascular: This is a technically adequate evaluation of the pulmonary vasculature. No filling defects or pulmonary emboli. The heart is enlarged without pericardial effusion. Moderate atherosclerosis throughout the coronary vasculature. No evidence of thoracic aortic aneurysm or dissection. Mild atherosclerosis of the aortic arch. Mediastinum/Nodes: No enlarged mediastinal, hilar, or axillary lymph nodes. Thyroid gland, trachea, and esophagus demonstrate no significant findings. Lungs/Pleura: Stable bibasilar subpleural scarring. Minimal hypoventilatory changes are seen at the lung bases. Trace bilateral pleural effusions, right greater than left. No acute airspace disease or pneumothorax. Central airways are patent. Upper Abdomen: No acute abnormality. Musculoskeletal: No acute or destructive bony lesions.  Reconstructed images demonstrate no additional findings. Review of the MIP images confirms the above findings. IMPRESSION: 1. No evidence of pulmonary embolus. 2. Cardiomegaly, with moderate coronary artery atherosclerosis. 3. Trace bilateral pleural effusions. 4.  Aortic Atherosclerosis (ICD10-I70.0). Electronically Signed   By: Randa Ngo M.D.   On: 07/03/2021 17:27   US Venous Img Lower Unilateral Left  Result Date: 07/03/2021 CLINICAL DATA:  Left thigh pain. EXAM: LEFT LOWER EXTREMITY VENOUS DOPPLER ULTRASOUND TECHNIQUE: Gray-scale sonography with compression, as well as color and duplex ultrasound, were performed to evaluate the deep venous system(s) from the level of the common femoral vein through the popliteal and proximal calf veins. COMPARISON:  None. FINDINGS: VENOUS Normal compressibility of the common femoral, superficial femoral, and popliteal veins, as well as the visualized calf veins. Visualized portions of profunda femoral vein and great saphenous vein unremarkable. No filling defects to suggest DVT on grayscale or color Doppler imaging. Doppler waveforms show normal direction of venous flow, normal respiratory plasticity and response to augmentation. Limited views of the contralateral common femoral vein are unremarkable. OTHER None. Limitations: none IMPRESSION: Negative. Electronically Signed   By: Audie Pinto M.D.   On: 07/03/2021 16:19        Scheduled Meds:  albuterol  2.5 mg Nebulization TID   apixaban  5 mg Oral BID   aspirin EC  81 mg Oral Daily   diltiazem  300 mg Oral Daily   DULoxetine  60 mg Oral BID   feeding supplement (GLUCERNA SHAKE)  237 mL Oral BID BM   metoprolol tartrate  25 mg Oral BID   mometasone-formoterol  2 puff Inhalation BID   multivitamin with minerals  1 tablet Oral Daily   predniSONE  40 mg Oral Q breakfast   Ensure Max Protein  11 oz Oral QHS   sodium chloride flush  3 mL Intravenous Q12H   tamsulosin  0.4 mg Oral QPC supper    Continuous Infusions:   LOS: 2 days    Time spent: 27 minutes    Sharen Hones, MD [facility]   To contact the attending provider between 7A-7P or the covering provider during after hours 7P-7A, please log into the web site [email or web address] and access using universal [place] password for that web site. If you do not have the password, please call the hospital operator.  07/05/2021, 11:16 AM

## 2021-07-05 NOTE — TOC Initial Note (Signed)
Transition of Care [facility]) - Initial/Assessment Note    Patient Details  Name: Matthew [name] MRN: [id number] Date of Birth: [birth year]  Transition of Care [facility] CM/SW Contact:    Beverly Sessions, RN Phone Number: 07/05/2021, 4:18 PM  Clinical Narrative:                 [facility].  PT recommending outpatient PT, OT recommending home health PT   Patient wishes to have home health services. States he does not have a preference of home health agency. Referral made to [facility]. Referral made for RN, PT, OT  Patient states that he has Rw, BSC and cane in the home Currently requiring acute O2  Expected Discharge Plan: [place] Barriers to Discharge: Continued Medical Work up   Patient Goals and CMS Choice        Expected Discharge Plan and Services Expected Discharge Plan: [place]       Living arrangements for the past 2 months: Mobile Home                           HH Arranged: RN, PT, OT [facility] Agency: [place] Date [facility] Agency Contacted: 07/05/21   Representative spoke with at [place]: Tommi Rumps  Prior Living Arrangements/Services Living arrangements for the past 2 months: Mobile Home Lives with:: Spouse Patient language and need for interpreter reviewed:: Yes Do you feel safe going back to the place where you live?: Yes      Need for Family Participation in Patient Care: Yes (Comment) Care giver support system in place?: Yes (comment) Current home services: DME Criminal Activity/Legal Involvement Pertinent to Current Situation/Hospitalization: No - Comment as needed  Activities of Daily Living Home Assistive Devices/Equipment: None ADL Screening (condition at time of admission) Patient's cognitive ability adequate to safely complete daily activities?: Yes Is the patient deaf or have difficulty hearing?: No Does the patient  have difficulty seeing, even when wearing glasses/contacts?: No Does the patient have difficulty concentrating, remembering, or making decisions?: No Patient able to express need for assistance with ADLs?: Yes Does the patient have difficulty dressing or bathing?: No Independently performs ADLs?: No Communication: Independent Dressing (OT): Independent Grooming: Independent Feeding: Independent Bathing: Independent Is this a change from baseline?: Pre-admission baseline Toileting: Independent In/Out Bed: Needs assistance Is this a change from baseline?: Pre-admission baseline Walks in Home: Independent Does the patient have difficulty walking or climbing stairs?: Yes Weakness of Legs: Both Weakness of Arms/Hands: None  Permission Sought/Granted                  Emotional Assessment       Orientation: : Oriented to Self, Oriented to Place, Oriented to  Time, Oriented to Situation Alcohol / Substance Use: Not Applicable Psych Involvement: No (comment)  Admission diagnosis:  Morbid obesity ([place]) [E66.01] COPD exacerbation ([place]) [J44.1] Atrial fibrillation with rapid ventricular response ([place]) [I48.91] Asthma exacerbation [J45.901] Non compliance w medication regimen [Z91.14] Patient Active Problem List   Diagnosis Date Noted   Asthma exacerbation 07/03/2021   Chest pain 07/03/2021   Acute gout of right ankle    Acute diastolic congestive heart failure (HCC)    Right leg pain    Weakness    SOB (shortness of breath) 06/12/2021   Bilateral leg edema 06/12/2021   Atrial fibrillation  with RVR ([place]) 06/12/2021   AKI (acute kidney injury) ([place]) 06/12/2021   Elevated glucose level 06/12/2021   Obesity (BMI 30-39.9) 06/12/2021   Macrocytosis 06/12/2021   Acute respiratory failure with hypoxia ([place]) 06/12/2021   Chronic pain of left knee 12/16/2020   COPD with acute exacerbation ([place]) 12/04/2020   Acute right hip pain 12/04/2020   H/O bilateral hip replacements  12/04/2020   Stage 3b chronic kidney disease ([place]) 12/04/2020   Displaced comminuted fracture of shaft of left femur, initial encounter for closed fracture ([place]) 05/19/2020   Asthma 05/18/2020   Essential hypertension 05/18/2020   Femur fracture, left ([place]) 05/18/2020   Closed comminuted intra-articular fracture of distal femur ([place]) 05/17/2020   Moderate persistent asthma with exacerbation 10/15/2018   PCP:  [name], MD Pharmacy:   [facility]ORE [place], [place] AT [facility] [address] [place] [address] Phone: [phone] Fax: [phone]  [id number] - Closed - [place], [place] [address] [place] [address] Phone: [phone] Fax: [phone]     Social Determinants of Health (SDOH) Interventions    Readmission Risk Interventions No flowsheet data found.

## 2021-07-05 NOTE — Progress Notes (Addendum)
Physical Therapy Treatment Patient Details Name: Matthew [name] MRN: [id number] DOB: Sep 11, 1950 Today's Date: 07/05/2021   History of Present Illness 70 y.o. male with medical history significant of Asthma, HTN, Atrial Fibrillation who presents with 3-4 weeks of progressive dyspnea, exertional fatigue and lightheadedness.    PT Comments    Pt received supine in bed. States he is not feeling well today and would prefer not to ambulate however is agreeable to bedside strengthening. Pt completed 5 consecutive STS with increased UE support during final 2 reps due to LE muscular fatigue. Pt then asked to return to bed due to not feeling well and feeling weak. PT educated on muscular weakness and importance to participate in regular physical activity to improve strength. PT encouraged pt to walk to the bathroom with nursing staff and sit up in the recliner/EOB for each meal to encourage movement. Would benefit from skilled PT to address above deficits and promote optimal return to PLOF.   Recommendations for follow up therapy are one component of a multi-disciplinary discharge planning process, led by the attending physician.  Recommendations may be updated based on patient status, additional functional criteria and insurance authorization.  Follow Up Recommendations  Home health PT     Assistance Recommended at Discharge Intermittent Supervision/Assistance  Equipment Recommendations  None recommended by PT    Recommendations for Other Services       Precautions / Restrictions Precautions Precautions: Fall Precaution Comments: monitor SpO2 when ambulating Restrictions Weight Bearing Restrictions: No     Mobility  Bed Mobility Overal bed mobility: Needs Assistance Bed Mobility: Supine to Sit;Sit to Supine     Supine to sit: Supervision Sit to supine: Supervision   General bed mobility comments: Increased time and effort    Transfers Overall transfer level: Needs  assistance Equipment used: Rolling walker (2 wheels) Transfers: Sit to/from Stand Sit to Stand: Min guard;From elevated surface           General transfer comment: Increased time and effort. Relied on BUE to push from bed and RW. Increased Wb through RLE due to increased pain in LLE. x5 reps for LE strengthening.    Ambulation/Gait     Assistive device: Rolling walker (2 wheels)         General Gait Details: side steps for 77ft to reposition towards HOB.   Stairs             Wheelchair Mobility    Modified Rankin (Stroke Patients Only)       Balance Overall balance assessment: Needs assistance Sitting-balance support: Feet supported;Single extremity supported Sitting balance-Leahy Scale: Good     Standing balance support: Bilateral upper extremity supported;During functional activity;Reliant on assistive device for balance Standing balance-Leahy Scale: Poor Standing balance comment: Reliant on RW during STS and static standing (5-10 seconds between each STS rep)                            Cognition Arousal/Alertness: Awake/alert Behavior During Therapy: WFL for tasks assessed/performed Overall Cognitive Status: Within Functional Limits for tasks assessed                                          Exercises      General Comments        Pertinent Vitals/Pain Pain Assessment: Faces Faces Pain Scale: Hurts a little bit  Pain Location: generallized stiffness throughout the body Pain Descriptors / Indicators: Aching;Sore    Home Living                          Prior Function            PT Goals (current goals can now be found in the care plan section) Acute Rehab PT Goals Patient Stated Goal: to get stronger and walk better PT Goal Formulation: With patient Time For Goal Achievement: 07/18/21 Potential to Achieve Goals: Good    Frequency    Min 2X/week      PT Plan      Co-evaluation               AM-PAC PT "6 Clicks" Mobility   Outcome Measure  Help needed turning from your back to your side while in a flat bed without using bedrails?: None Help needed moving from lying on your back to sitting on the side of a flat bed without using bedrails?: None Help needed moving to and from a bed to a chair (including a wheelchair)?: A Little Help needed standing up from a chair using your arms (e.g., wheelchair or bedside chair)?: A Little Help needed to walk in hospital room?: A Little Help needed climbing 3-5 steps with a railing? : A Lot 6 Click Score: 19    End of Session Equipment Utilized During Treatment: Gait belt Activity Tolerance: Patient limited by fatigue Patient left: in bed;with call bell/phone within reach;with bed alarm set Nurse Communication: Mobility status;Precautions PT Visit Diagnosis: Muscle weakness (generalized) (M62.81);History of falling (Z91.81);Difficulty in walking, not elsewhere classified (R26.2);Repeated falls (R29.6);Unsteadiness on feet (R26.81)     Time: 6767-2094 PT Time Calculation (min) (ACUTE ONLY): 18 min  Charges:  $Therapeutic Exercise: 8-22 mins                     Patrina Levering PT, DPT 07/05/21 4:12 PM [phone]

## 2021-07-05 NOTE — Plan of Care (Signed)
Pt is alert but very forgetful. V/S stable. Complained of SOB , RR-20 ,O2 sat 96% on 2lpm/[place].- Pt requested for albuterol neb tx several times.

## 2021-07-06 DIAGNOSIS — J441 Chronic obstructive pulmonary disease with (acute) exacerbation: Secondary | ICD-10-CM | POA: Diagnosis not present

## 2021-07-06 LAB — BASIC METABOLIC PANEL
Anion gap: 6 (ref 5–15)
BUN: 59 mg/dL — ABNORMAL HIGH (ref 8–23)
CO2: 24 mmol/L (ref 22–32)
Calcium: 8.3 mg/dL — ABNORMAL LOW (ref 8.9–10.3)
Chloride: 107 mmol/L (ref 98–111)
Creatinine, Ser: 2.28 mg/dL — ABNORMAL HIGH (ref 0.61–1.24)
GFR, Estimated: 30 mL/min — ABNORMAL LOW (ref >60)
Glucose, Bld: 90 mg/dL (ref 70–99)
Potassium: 4.2 mmol/L (ref 3.5–5.1)
Sodium: 137 mmol/L (ref 135–145)

## 2021-07-06 MED ORDER — IPRATROPIUM-ALBUTEROL 0.5-2.5 (3) MG/3ML IN SOLN
3.0000 mL | Freq: Three times a day (TID) | RESPIRATORY_TRACT | Status: DC
  Administered 2021-07-07 (×2): 3 mL via RESPIRATORY_TRACT
  Filled 2021-07-06 (×2): qty 3

## 2021-07-06 MED ORDER — GLUCERNA SHAKE PO LIQD
237.0000 mL | Freq: Three times a day (TID) | ORAL | Status: DC
  Administered 2021-07-06 – 2021-07-07 (×3): 237 mL via ORAL

## 2021-07-06 MED ORDER — INFLUENZA VAC A&B SA ADJ QUAD 0.5 ML IM PRSY
0.5000 mL | PREFILLED_SYRINGE | INTRAMUSCULAR | Status: AC
  Administered 2021-07-07: 0.5 mL via INTRAMUSCULAR
  Filled 2021-07-06: qty 0.5

## 2021-07-06 NOTE — Progress Notes (Signed)
Occupational Therapy Treatment Patient Details Name: ISAIYAH FELDHAUS MRN: [id number] DOB: [birth year] Today's Date: 07/06/2021   History of present illness 70 y.o. male with medical history significant of asthma, HTN, atrial fibrillation who presents with 3-4 weeks of progressive dyspnea, exertional fatigue and lightheadedness.   OT comments  Pt seen for OT treatment on this date. Upon arrival to room, pt awake and seated upright in bed. Pt reporting no pain and agreeable to OT tx. Pt currently requires SUPERVISION for bed mobility, SUPERVISION for seated LB dressing, MIN GUARD for standing grooming tasks, and MIN GUARD for functional mobility of short household distances (3ft) with RW d/t decreased activity tolerance and strength. This date, pt only able to tolerate x2 standing grooming before requiring seated rest break. Pt is making good progress toward goals and would benefit from additional skilled OT services to facilitate implementation of energy conservation techniques into daily routines and to maximize return to PLOF. Will continue to follow POC. Discharge recommendation remains appropriate.     Recommendations for follow up therapy are one component of a multi-disciplinary discharge planning process, led by the attending physician.  Recommendations may be updated based on patient status, additional functional criteria and insurance authorization.    Follow Up Recommendations  Home health OT    Assistance Recommended at Discharge Intermittent Supervision/Assistance  Equipment Recommendations  None recommended by OT       Precautions / Restrictions Precautions Precautions: Fall Precaution Comments: monitor SpO2 & HR when ambulating Restrictions Weight Bearing Restrictions: No       Mobility Bed Mobility Overal bed mobility: Needs Assistance Bed Mobility: Supine to Sit     Supine to sit: Supervision     General bed mobility comments: Requires increased time and effort     Transfers Overall transfer level: Needs assistance Equipment used: Rolling walker (2 wheels) Transfers: Sit to/from Stand Sit to Stand: Min guard;From elevated surface (bed elevated to pt's bed height)                 Balance Overall balance assessment: Needs assistance Sitting-balance support: Feet supported Sitting balance-Leahy Scale: Good Sitting balance - Comments: good sitting balance reaching within BOS   Standing balance support: Bilateral upper extremity supported;During functional activity;Reliant on assistive device for balance Standing balance-Leahy Scale: Poor Standing balance comment: Reliant on b/l UE support during functional mobility of short household distances and standing grooming tasks                           ADL either performed or assessed with clinical judgement   ADL Overall ADL's : Needs assistance/impaired     Grooming: Wash/dry face;Oral care;Min guard;Standing Grooming Details (indicate cue type and reason): Pt initiating b/l UE support via forearms on countertop d/t fatigue during x2 standing grooming tasks             Lower Body Dressing: Supervision/safety;Set up;Sitting/lateral leans Lower Body Dressing Details (indicate cue type and reason): to don socks, with LE propped up on bed             Functional mobility during ADLs: Min guard;Rolling walker (2 wheels) (to walk 54ft)      Extremity/Trunk Assessment Upper Extremity Assessment Upper Extremity Assessment: Generalized weakness   Lower Extremity Assessment Lower Extremity Assessment: Generalized weakness   Cervical / Trunk Assessment Cervical / Trunk Assessment: Normal    Vision Ability to See in Adequate Light: 0 Adequate Patient Visual Report: No change  from baseline            Cognition Arousal/Alertness: Awake/alert Behavior During Therapy: WFL for tasks assessed/performed Overall Cognitive Status: Within Functional Limits for tasks assessed                                  General Comments: Oriented to self, time, place, & situation                General Comments Following bed mobility while on 2L, SpO2 84% (questionable reading from therapist's pulse ox). O2 titrated to 3L/min prior to mobility and pulse ox (from dyanamap) reporting >94% during functional mobility. At end of session, pt titrated back down to 2L, with SpO2 92% and pt left with respiratory therapist    Pertinent Vitals/ Pain        Denies pain/No pain         Frequency  Min 2X/week        Progress Toward Goals  OT Goals(current goals can now be found in the care plan section)  Progress towards OT goals: Progressing toward goals  Acute Rehab OT Goals Patient Stated Goal: to get strength back OT Goal Formulation: With patient Time For Goal Achievement: 07/18/21 Potential to Achieve Goals: Good  Plan Discharge plan remains appropriate;Frequency remains appropriate       AM-PAC OT "6 Clicks" Daily Activity     Outcome Measure   Help from another person eating meals?: None Help from another person taking care of personal grooming?: A Little Help from another person toileting, which includes using toliet, bedpan, or urinal?: A Little Help from another person bathing (including washing, rinsing, drying)?: A Lot Help from another person to put on and taking off regular upper body clothing?: A Little Help from another person to put on and taking off regular lower body clothing?: A Little 6 Click Score: 18    End of Session Equipment Utilized During Treatment: Rolling walker (2 wheels);Oxygen  OT Visit Diagnosis: Other abnormalities of gait and mobility (R26.89);History of falling (Z91.81)   Activity Tolerance Patient tolerated treatment well   Patient Left in chair;with call bell/phone within reach;Other (comment) (with respiratory therapist)   Nurse Communication Mobility status;Other (comment) (vitals during functional  mobility)        Time: 1245-8099 OT Time Calculation (min): 28 min  Charges: OT General Charges $OT Visit: 1 Visit OT Treatments $Self Care/Home Management : 23-37 mins  Fredirick Maudlin, OTR/L [name]

## 2021-07-06 NOTE — Care Management Important Message (Signed)
Important Message  Patient Details  Name: Matthew [name] MRN: [id number] Date of Birth: Dec 19, 1950   Medicare Important Message Given:  Yes     Dannette Barbara 07/06/2021, 10:48 AM

## 2021-07-06 NOTE — Plan of Care (Signed)
  Problem: Clinical Measurements: Goal: Ability to maintain clinical measurements within normal limits will improve Outcome: Progressing Goal: Will remain free from infection Outcome: Progressing Goal: Diagnostic test results will improve Outcome: Progressing Goal: Respiratory complications will improve Outcome: Progressing Goal: Cardiovascular complication will be avoided Outcome: Progressing   Problem: Pain Managment: Goal: General experience of comfort will improve Outcome: Progressing   Pt is alert but disoriented x3. Started being confused at midnight. Pt reoriented and reassured. V/S stable. Kept on oxygen at 2L/min with sats at 96%. Complained of headache; Oxycodone given.

## 2021-07-06 NOTE — TOC Progression Note (Signed)
Transition of Care [facility]) - Progression Note    Patient Details  Name: BRAYLIN XU MRN: [id number] Date of Birth: 10-22-1950  [facility]) CM/SW Contact  Beverly Sessions, RN Phone Number: 07/06/2021, 2:23 PM  Clinical Narrative:     Still requiring acute O2  Expected Discharge Plan: [place] Barriers to Discharge: Continued Medical Work up  Expected Discharge Plan and Services Expected Discharge Plan: [place] arrangements for the past 2 months: Mobile Home                           HH Arranged: RN, PT, OT [facility] Agency: [place] Date [place]: 07/05/21   Representative spoke with at [place]: [place] ([place]) Interventions    Readmission Risk Interventions No flowsheet data found.

## 2021-07-06 NOTE — Progress Notes (Signed)
SATURATION QUALIFICATIONS: (This note is used to comply with regulatory documentation for home oxygen)  Patient Saturations on Room Air at Rest = 93%  Patient Saturations on Room Air while Ambulating = 84%  Patient Saturations on 3 Liters of oxygen while Ambulating = 94%  Please briefly explain why patient needs home oxygen: Patient de-sats to 84-85% when ambulating or any exertion.

## 2021-07-06 NOTE — Progress Notes (Signed)
PROGRESS NOTE    Matthew [name]  XBM:[id number] DOB: 1951/03/21 DOA: 07/03/2021 PCP: [name], MD  [phone]   Assessment & Plan:   Principal Problem:   Asthma exacerbation Active Problems:   Essential hypertension   COPD with acute exacerbation ([place])   Stage 3b chronic kidney disease ([place])   Atrial fibrillation with RVR ([place])   Acute respiratory failure with hypoxia ([place])   Chest pain   Matthew [name] is a 70 y.o. male with medical history significant of Asthma, HTN, Atrial Fibrillation who presents with 3-4 weeks of progressive dyspnea, exertional fatigue and lightheadedness. He was found to have oxygen saturation of 82% on room air, started on oxygen.  He was also started on IV steroids for COPD exacerbation.   Acute hypoxemic respiratory failure secondary to COPD exacerbation. Patient does not have volume overload.  He has a mild elevation of BNP, associated with chronic kidney disease. Does not have bacterial infection. CT angiogram of the chest did not show PE, no pneumonia.  Trace bilateral pleural effusion. Patient to respiratory status is improving --Continue supplemental O2 to keep sats between 88-92%, wean as tolerated --cont prednisone and scheduled DuoNeb   Chronic kidney disease stage IIIb. --stable  Hyperkalemia. --s/p Kayexalate --monitor  Anemia of chronic kidney disease. Hemoglobins are stable, no iron deficiency. --monitor Hgb   Chronic atrial fibrillation with rapid ventricular response. Heart rate under control. --cont home Cardizem --cont Eliquis   Chest pain, non-cardiac Appears to be secondary to exacerbation of COPD.  Troponin negative.   DVT prophylaxis: IH:[id number] Code Status: Full code  Family Communication:  Level of care: Med-Surg Dispo:   The patient is from: SNF Anticipated d/c is to: home Anticipated d/c date is: likely tomorrow Patient currently is not medically ready to d/c due to: still desat in RA   Subjective  and Interval History:  Pt reported breathing much improved since presentation.  No cough.  Eating well.  Chronic leg swelling at baseline.   Objective: Vitals:   07/06/21 0743 07/06/21 1414 07/06/21 1557 07/06/21 2033  BP: (!) 144/91  (!) 143/86 120/70  Pulse: (!) 108  (!) 101 100  Resp: 16  18 (!) 21  Temp: (!) 97.5 F (36.4 C)  (!) 97.5 F (36.4 C) 97.9 F (36.6 C)  TempSrc: Oral  Oral Oral  SpO2: 100%  98% 99%  Weight:  133.9 kg    Height:        Intake/Output Summary (Last 24 hours) at 07/07/2021 0342 Last data filed at 07/06/2021 1859 Gross per 24 hour  Intake 540 ml  Output 1150 ml  Net -610 ml   Filed Weights   07/05/21 1322 07/06/21 1414  Weight: 135.5 kg 133.9 kg    Examination:   Constitutional: NAD, AAOx3 HEENT: conjunctivae and lids normal, EOMI CV: No cyanosis.   RESP: normal respiratory effort, no wheezing Extremities: trace pitting edema in BLE SKIN: warm, dry Neuro: II - XII grossly intact.   Psych: Normal mood and affect.  Appropriate judgement and reason   Data Reviewed: I have personally reviewed following labs and imaging studies  CBC: Recent Labs  Lab 07/03/21 1530 07/05/21 0329  WBC 8.8 7.7  NEUTROABS  --  6.9  HGB 8.7* 8.1*  HCT 28.3* 26.7*  MCV 106.0* 106.4*  PLT 169 638   Basic Metabolic Panel: Recent Labs  Lab 07/03/21 1530 07/04/21 0715 07/05/21 0329 07/05/21 1336 07/06/21 0431  NA 135 136 140  --  137  K  5.6* 5.1 5.9* 4.9 4.2  CL 107 105 108  --  107  CO2 23 23 26   --  24  GLUCOSE 111* 128* 121*  --  90  BUN 44* 45* 51*  --  59*  CREATININE 2.21* 2.27* 2.36*  --  2.28*  CALCIUM 8.7* 8.8* 9.2  --  8.3*  MG 1.7 2.3 2.3  --   --    GFR: Estimated Creatinine Clearance: 43.3 mL/min (A) (by C-G formula based on SCr of 2.28 mg/dL (H)). Liver Function Tests: No results for input(s): AST, ALT, ALKPHOS, BILITOT, PROT, ALBUMIN in the last 168 hours. No results for input(s): LIPASE, AMYLASE in the last 168 hours. No  results for input(s): AMMONIA in the last 168 hours. Coagulation Profile: No results for input(s): INR, PROTIME in the last 168 hours. Cardiac Enzymes: No results for input(s): CKTOTAL, CKMB, CKMBINDEX, TROPONINI in the last 168 hours. BNP (last 3 results) No results for input(s): PROBNP in the last 8760 hours. HbA1C: No results for input(s): HGBA1C in the last 72 hours. CBG: No results for input(s): GLUCAP in the last 168 hours. Lipid Profile: No results for input(s): CHOL, HDL, LDLCALC, TRIG, CHOLHDL, LDLDIRECT in the last 72 hours. Thyroid Function Tests: No results for input(s): TSH, T4TOTAL, FREET4, T3FREE, THYROIDAB in the last 72 hours. Anemia Panel: Recent Labs    07/05/21 0329 07/05/21 0825  VITAMINB12  --  337  TIBC 199*  --   IRON 63  --    Sepsis Labs: No results for input(s): PROCALCITON, LATICACIDVEN in the last 168 hours.  Recent Results (from the past 240 hour(s))  Resp Panel by RT-PCR (Flu A&B, Covid) Nasopharyngeal Swab     Status: None   Collection Time: 07/03/21  4:21 PM   Specimen: Nasopharyngeal Swab; Nasopharyngeal(NP) swabs in vial transport medium  Result Value Ref Range Status   SARS Coronavirus 2 by RT PCR NEGATIVE NEGATIVE Final    Comment: (NOTE) SARS-CoV-2 target nucleic acids are NOT DETECTED.  The SARS-CoV-2 RNA is generally detectable in upper respiratory specimens during the acute phase of infection. The lowest concentration of SARS-CoV-2 viral copies this assay can detect is 138 copies/mL. A negative result does not preclude SARS-Cov-2 infection and should not be used as the sole basis for treatment or other patient management decisions. A negative result may occur with  improper specimen collection/handling, submission of specimen other than nasopharyngeal swab, presence of viral mutation(s) within the areas targeted by this assay, and inadequate number of viral copies(<138 copies/mL). A negative result must be combined with clinical  observations, patient history, and epidemiological information. The expected result is Negative.  Fact Sheet for Patients:  EntrepreneurPulse.com.au  Fact Sheet for Healthcare Providers:  IncredibleEmployment.be  This test is no t yet approved or cleared by the [place] FDA and  has been authorized for detection and/or diagnosis of SARS-CoV-2 by FDA under an Emergency Use Authorization (EUA). This EUA will remain  in effect (meaning this test can be used) for the duration of the COVID-19 declaration under Section 564(b)(1) of the Act, 21 U.S.C.section 360bbb-3(b)(1), unless the authorization is terminated  or revoked sooner.       Influenza A by PCR NEGATIVE NEGATIVE Final   Influenza B by PCR NEGATIVE NEGATIVE Final    Comment: (NOTE) The Xpert Xpress SARS-CoV-2/FLU/RSV plus assay is intended as an aid in the diagnosis of influenza from Nasopharyngeal swab specimens and should not be used as a sole basis for treatment. Nasal washings  and aspirates are unacceptable for Xpert Xpress SARS-CoV-2/FLU/RSV testing.  Fact Sheet for Patients: EntrepreneurPulse.com.au  Fact Sheet for Healthcare Providers: IncredibleEmployment.be  This test is not yet approved or cleared by the [place] FDA and has been authorized for detection and/or diagnosis of SARS-CoV-2 by FDA under an Emergency Use Authorization (EUA). This EUA will remain in effect (meaning this test can be used) for the duration of the COVID-19 declaration under Section 564(b)(1) of the Act, 21 U.S.C. section 360bbb-3(b)(1), unless the authorization is terminated or revoked.  Performed at [facility], [address] [place], [place] [address]       Radiology Studies: No results found.   Scheduled Meds:  apixaban  5 mg Oral BID   aspirin EC  81 mg Oral Daily   diltiazem  300 mg Oral Daily   DULoxetine  60 mg Oral BID    feeding supplement (GLUCERNA SHAKE)  237 mL Oral TID BM   influenza vaccine adjuvanted  0.5 mL Intramuscular Tomorrow-1000   ipratropium-albuterol  3 mL Nebulization TID   metoprolol tartrate  25 mg Oral BID   mometasone-formoterol  2 puff Inhalation BID   multivitamin with minerals  1 tablet Oral Daily   predniSONE  40 mg Oral Q breakfast   sodium chloride flush  3 mL Intravenous Q12H   tamsulosin  0.4 mg Oral QPC supper   Continuous Infusions:   LOS: 4 days     Enzo Bi, MD [facility] If 7PM-7AM, please contact night-coverage 07/07/2021, 3:42 AM

## 2021-07-06 NOTE — Progress Notes (Signed)
PT Cancellation Note  Patient Details Name: ARROW TOMKO MRN: [id number] DOB: 1951-05-03   Cancelled Treatment:     Therapist in to see pt who had just finished with OT and experienced O2 desaturation to 84% requiring increased titration to 3L for recovery. Pt fatigued at time of visit. Pt plans to d/c home tomorrow with O2, all needs met.    Josie Dixon 07/06/2021, 4:27 PM

## 2021-07-06 NOTE — Progress Notes (Signed)
Nutrition Follow-up  DOCUMENTATION CODES:   Not applicable  INTERVENTION:   Glucerna Shake po TID, each supplement provides 220 kcal and 10 grams of protein  Double protein with meal trays   MVI po daily   NUTRITION DIAGNOSIS:   Increased nutrient needs related to chronic illness (COPD) as evidenced by estimated needs.  GOAL:   Patient will meet greater than or equal to 90% of their needs -met   MONITOR:   PO intake, Supplement acceptance, Labs, Weight trends, Skin, I & O's  ASSESSMENT:   70 y/o male with h/o asthma, HTN, COPD, CKD II and Afib who is admitted with COPD exacerbation.  Met with pt in room today. Pt reports continued good appetite and oral intake; pt eating 100% of meals including the double protein on his meal trays. Pt reports that he enjoys the Glucerna supplements (chocolate or butter pecan) but reports that he does not care for the Ensure Max. Per chart, pt appears weight stable.   Medications reviewed and include: aspirin, MVI, prednisone  Labs reviewed: K 4.2 wnl, BUN 59(H), creat 2.28(H) Mg 2.3 wnl- 11/22 Hgb 8.1(L), Hct 26.7(L)- 11/22  Diet Order:   Diet Order             Diet regular Room service appropriate? Yes; Fluid consistency: Thin  Diet effective now                  EDUCATION NEEDS:   No education needs have been identified at this time  Skin:  Skin Assessment: Skin Integrity Issues: Skin Integrity Issues:: Other (Comment) Other: MASD to buttocks  Last BM:  pta  Height:   Ht Readings from Last 1 Encounters:  07/05/21 _0  (1.88 m)    Weight:   Wt Readings from Last 1 Encounters:  07/05/21 135.5 kg    Ideal Body Weight:  86.4 kg  BMI:  Body mass index is 38.35 kg/m.  Estimated Nutritional Needs:   Kcal:  2700-3000kcal/day  Protein:  >135g/day  Fluid:  2.2-2.5L/day  Koleen Distance MS, RD, LDN Please refer to [facility] for RD and/or RD on-call/weekend/after hours pager

## 2021-07-07 LAB — CBC
HCT: 26.7 % — ABNORMAL LOW (ref 39.0–52.0)
Hemoglobin: 7.9 g/dL — ABNORMAL LOW (ref 13.0–17.0)
MCH: 31.1 pg (ref 26.0–34.0)
MCHC: 29.6 g/dL — ABNORMAL LOW (ref 30.0–36.0)
MCV: 105.1 fL — ABNORMAL HIGH (ref 80.0–100.0)
Platelets: 157 10*3/uL (ref 150–400)
RBC: 2.54 MIL/uL — ABNORMAL LOW (ref 4.22–5.81)
RDW: 14 % (ref 11.5–15.5)
WBC: 5.5 10*3/uL (ref 4.0–10.5)
nRBC: 0 % (ref 0.0–0.2)

## 2021-07-07 LAB — BASIC METABOLIC PANEL
Anion gap: 5 (ref 5–15)
BUN: 63 mg/dL — ABNORMAL HIGH (ref 8–23)
CO2: 25 mmol/L (ref 22–32)
Calcium: 7.8 mg/dL — ABNORMAL LOW (ref 8.9–10.3)
Chloride: 105 mmol/L (ref 98–111)
Creatinine, Ser: 2.37 mg/dL — ABNORMAL HIGH (ref 0.61–1.24)
GFR, Estimated: 29 mL/min — ABNORMAL LOW (ref >60)
Glucose, Bld: 95 mg/dL (ref 70–99)
Potassium: 4.4 mmol/L (ref 3.5–5.1)
Sodium: 135 mmol/L (ref 135–145)

## 2021-07-07 LAB — MAGNESIUM: Magnesium: 1.9 mg/dL (ref 1.7–2.4)

## 2021-07-07 MED ORDER — DILTIAZEM HCL ER COATED BEADS 300 MG PO CP24
300.0000 mg | ORAL_CAPSULE | Freq: Every day | ORAL | 0 refills | Status: DC
Start: 2021-07-07 — End: 2021-07-23

## 2021-07-07 MED ORDER — MOMETASONE FURO-FORMOTEROL FUM 200-5 MCG/ACT IN AERO
2.0000 | INHALATION_SPRAY | Freq: Two times a day (BID) | RESPIRATORY_TRACT | 2 refills | Status: DC
Start: 2021-07-07 — End: 2021-07-23

## 2021-07-07 MED ORDER — IPRATROPIUM-ALBUTEROL 0.5-2.5 (3) MG/3ML IN SOLN: 3.0000 mL | Freq: Four times a day (QID) | RESPIRATORY_TRACT | 2 refills | Status: DC | PRN

## 2021-07-07 MED ORDER — METOPROLOL TARTRATE 25 MG PO TABS: 25.0000 mg | ORAL_TABLET | Freq: Two times a day (BID) | ORAL | 0 refills | Status: DC

## 2021-07-07 MED ORDER — ADULT MULTIVITAMIN W/MINERALS CH
1.0000 | ORAL_TABLET | Freq: Every day | ORAL | Status: DC
Start: 2021-07-07 — End: 2021-10-29

## 2021-07-07 MED ORDER — APIXABAN 5 MG PO TABS: 5.0000 mg | ORAL_TABLET | Freq: Two times a day (BID) | ORAL | 0 refills | Status: DC

## 2021-07-07 MED ORDER — TAMSULOSIN HCL 0.4 MG PO CAPS: 0.4000 mg | ORAL_CAPSULE | Freq: Every day | ORAL | 0 refills | Status: DC

## 2021-07-07 NOTE — TOC Transition Note (Addendum)
Transition of Care [facility]) - CM/SW Discharge Note   Patient Details  Name: Matthew [name] MRN: [id number] Date of Birth: [birth year]  [facility] CM/SW Contact:  Kerin Salen, RN Phone Number: 07/07/2021, 10:08 AM   Clinical Narrative: TOCRN spoke with patient about discharge transportation, he says his wife will come when he calls. Patient understands that [employer] will deliver oxygen and nebulizer machine within two hours. TOC barriers resolved.  2:10pm Wife not comfortable with patient discharge, Attending and Nurse aware. PT will assist wife with fears and if wife is comfortable with caring for patient at home patient will be discharged today, if not, TOC can assist with discharge arrangements tomorrow. My Supervisor Andreas Newport, Attending, RN and PT notified.    Final next level of care: Home w Home Health Services Barriers to Discharge: Barriers Resolved   Patient Goals and CMS Choice        Discharge Placement                  Name of family member notified: Patient says his wife will transport him home when he call. Patient and family notified of of transfer: 07/07/21  Discharge Plan and Services                DME Arranged: Nebulizer machine, Oxygen DME Agency: [employer] Date DME Agency Contacted: 07/07/21 Time DME Agency Contacted: [phone] Representative spoke with at DME Agency: [place]: RN, PT, OT [place] Agency: [place] Date [place]: 07/06/21   Representative spoke with at [place]: [place] ([place]) Interventions     Readmission Risk Interventions No flowsheet data found.

## 2021-07-07 NOTE — Discharge Summary (Signed)
Physician Discharge Summary   Matthew [name]  male DOB: 1951/03/16  ZDG:[id number]  PCP: [name], MD  Admit date: 07/03/2021 Discharge date: 07/07/2021  Admitted From: SNF Disposition:  home Home Health: Yes CODE STATUS: Full code  Discharge Instructions     Discharge instructions   Complete by: As directed    You have been treated for COPD flare up.  You are discharged on 2 liters of supplemental oxygen.  Please follow up with your outpatient to see if/when you can get off of supplemental oxygen.  I have ordered you a DuoNeb machine and solutions for home use.  I have refilled all your essential medications.  Please be sure to follow up with your PCP for further refills.   Dr. Enzo Bi [facility] Course:  For full details, please see H&P, progress notes, consult notes and ancillary notes.  Briefly,  Matthew [name] is a 70 y.o. male with medical history significant of COPD/Asthma, HTN, Atrial Fibrillation who presented with 3-4 weeks of progressive dyspnea, exertional fatigue and lightheadedness.  He was found to have oxygen saturation of 82% on room air, started on oxygen.   Acute hypoxemic respiratory failure secondary to COPD exacerbation. Patient did not have volume overload.  Did not have bacterial infection. CT angiogram of the chest did not show PE, no pneumonia.   --Pt admitted not following up with his outpatient providers and did not have bronchodilators at home.  --Pt was treated with steroid burst and DuoNeb with improvement in his respiratory symptoms.   --Prior to discharge, pt still needed 2L supplemental oxygen, and was discharged with home 2L O2. --DuoNeb machine provided, and Dulera refilled.  Chronic kidney disease stage IIIb. --stable  Hyperkalemia, resolved --resolved with Kayexalate   Anemia of chronic kidney disease. Hemoglobins stable ~8's, no signs of bleeding, no iron deficiency.  Chronic atrial fibrillation with  rapid ventricular response. Heart rate under control. --cont home Cardizem --cont Eliquis   Chest pain, non-cardiac Appears to be secondary to exacerbation of COPD.  Troponin negative.  Medication non-compliance --pt admitted to not following up with his PCP so he didn't have any Rx refills. --All essential meds refilled at discharge.   Discharge Diagnoses:  Principal Problem:   Asthma exacerbation Active Problems:   Essential hypertension   COPD with acute exacerbation ([place])   Stage 3b chronic kidney disease ([place])   Atrial fibrillation with RVR (HCC)   Acute respiratory failure with hypoxia (HCC)   Chest pain   30 Day Unplanned Readmission Risk Score    Flowsheet Row ED to Hosp-Admission (Current) from 07/03/2021 in [place]  30 Day Unplanned Readmission Risk Score (%) 23.62 Filed at 07/07/2021 0801       This score is the patient's risk of an unplanned readmission within 30 days of being discharged (0 -100%). The score is based on dignosis, age, lab data, medications, orders, and past utilization.   Low:  0-14.9   Medium: 15-21.9   High: 22-29.9   Extreme: 30 and above         Discharge Instructions:  Allergies as of 07/07/2021       Reactions   Demerol [meperidine Hcl]    Lisinopril Other (See Comments)   Hypotensive         Medication List     STOP taking these medications    hydrALAZINE 10 MG tablet Commonly known as: APRESOLINE  neomycin-bacitracin-polymyxin Oint Commonly known as: NEOSPORIN   predniSONE 10 MG tablet Commonly known as: DELTASONE       TAKE these medications    allopurinol 100 MG tablet Commonly known as: ZYLOPRIM Take 1 tablet (100 mg total) by mouth as needed (gout).   apixaban 5 MG Tabs tablet Commonly known as: ELIQUIS Take 1 tablet (5 mg total) by mouth 2 (two) times daily.   aspirin 81 MG EC tablet Take 1 tablet (81 mg total) by mouth daily. Swallow whole.   bisacodyl  5 MG EC tablet Commonly known as: DULCOLAX Take 1 tablet (5 mg total) by mouth daily as needed for moderate constipation.   colchicine 0.6 MG tablet Take 0.5 tablets (0.3 mg total) by mouth daily.   diltiazem 300 MG 24 hr capsule Commonly known as: CARDIZEM CD Take 1 capsule (300 mg total) by mouth daily.   DULoxetine 60 MG capsule Commonly known as: CYMBALTA Take 1 capsule (60 mg total) by mouth 2 (two) times daily.   ipratropium-albuterol 0.5-2.5 (3) MG/3ML Soln Commonly known as: DUONEB Take 3 mLs by nebulization every 6 (six) hours as needed. What changed: when to take this   melatonin 5 MG Tabs Take 1 tablet (5 mg total) by mouth at bedtime.   metoprolol tartrate 25 MG tablet Commonly known as: LOPRESSOR Take 1 tablet (25 mg total) by mouth 2 (two) times daily.   mirtazapine 15 MG tablet Commonly known as: REMERON Take 15 mg by mouth at bedtime.   mometasone-formoterol 200-5 MCG/ACT Aero Commonly known as: DULERA Inhale 2 puffs into the lungs 2 (two) times daily.   multivitamin with minerals Tabs tablet Take 1 tablet by mouth daily.   polyethylene glycol 17 g packet Commonly known as: MIRALAX / GLYCOLAX Take 17 g by mouth daily as needed for mild constipation.   tamsulosin 0.4 MG Caps capsule Commonly known as: FLOMAX Take 1 capsule (0.4 mg total) by mouth daily after supper.               Durable Medical Equipment  (From admission, onward)           Start     Ordered   07/07/21 0915  For home use only DME oxygen  Once       Question Answer Comment  Length of Need 6 Months   Mode or (Route) Nasal cannula   Liters per Minute 2   Frequency Continuous (stationary and portable oxygen unit needed)   Oxygen delivery system Gas      07/07/21 0914   07/06/21 1620  For home use only DME Nebulizer machine  Once       Question Answer Comment  Patient needs a nebulizer to treat with the following condition COPD (chronic obstructive pulmonary disease)  ([place])   Length of Need Lifetime      07/06/21 1619             Follow-up Information     [name], MD Follow up in 1 week(s).   Specialties: Internal Medicine, Cardiology Contact information: [address] [place] [address] [phone]                 Allergies  Allergen Reactions   Demerol [Meperidine Hcl]    Lisinopril Other (See Comments)    Hypotensive      The results of significant diagnostics from this hospitalization (including imaging, microbiology, ancillary and laboratory) are listed below for reference.   Consultations:   Procedures/Studies: DG Chest  2 View  Result Date: 07/03/2021 CLINICAL DATA:  Shortness of breath EXAM: CHEST - 2 VIEW COMPARISON:  06/17/2021 FINDINGS: Cardiomegaly. Trace bilateral pleural effusions and or pleural thickening, unchanged. The visualized skeletal structures are unremarkable. IMPRESSION: 1.  Cardiomegaly. 2. Trace bilateral pleural effusions and or pleural thickening, unchanged. 3.  No acute appearing airspace opacity. Electronically Signed   By: Delanna Ahmadi M.D.   On: 07/03/2021 16:06   CT Angio Chest PE W and/or Wo Contrast  Result Date: 07/03/2021 CLINICAL DATA:  Chest pain, short of breath EXAM: CT ANGIOGRAPHY CHEST WITH CONTRAST TECHNIQUE: Multidetector CT imaging of the chest was performed using the standard protocol during bolus administration of intravenous contrast. Multiplanar CT image reconstructions and MIPs were obtained to evaluate the vascular anatomy. CONTRAST:  38mL OMNIPAQUE IOHEXOL 350 MG/ML SOLN COMPARISON:  07/03/2021, 08/13/2020 FINDINGS: Cardiovascular: This is a technically adequate evaluation of the pulmonary vasculature. No filling defects or pulmonary emboli. The heart is enlarged without pericardial effusion. Moderate atherosclerosis throughout the coronary vasculature. No evidence of thoracic aortic aneurysm or dissection. Mild atherosclerosis of the aortic arch. Mediastinum/Nodes:  No enlarged mediastinal, hilar, or axillary lymph nodes. Thyroid gland, trachea, and esophagus demonstrate no significant findings. Lungs/Pleura: Stable bibasilar subpleural scarring. Minimal hypoventilatory changes are seen at the lung bases. Trace bilateral pleural effusions, right greater than left. No acute airspace disease or pneumothorax. Central airways are patent. Upper Abdomen: No acute abnormality. Musculoskeletal: No acute or destructive bony lesions. Reconstructed images demonstrate no additional findings. Review of the MIP images confirms the above findings. IMPRESSION: 1. No evidence of pulmonary embolus. 2. Cardiomegaly, with moderate coronary artery atherosclerosis. 3. Trace bilateral pleural effusions. 4.  Aortic Atherosclerosis (ICD10-I70.0). Electronically Signed   By: Randa Ngo M.D.   On: 07/03/2021 17:27   US RENAL  Result Date: 06/15/2021 CLINICAL DATA:  AKI EXAM: RENAL / URINARY TRACT ULTRASOUND COMPLETE COMPARISON:  None. FINDINGS: Right Kidney: Renal measurements: 11.4 x 5 x 5.2 cm = volume: 156 mL. Echogenicity is increased. Cysts are present at the lower pole. No hydronephrosis visualized. Left Kidney: Renal measurements: 11.9 x 6.3 x 5.4 cm = volume: 213 mL. Echogenicity is increased. Cysts are present at the upper pole. No hydronephrosis visualized. Bladder: Appears normal for degree of bladder distention. Other: None. IMPRESSION: No hydronephrosis. Increased renal echogenicity suggesting medical renal disease. Electronically Signed   By: Macy Mis M.D.   On: 06/15/2021 15:10   US Venous Img Lower Bilateral (DVT)  Result Date: 06/13/2021 CLINICAL DATA:  Shortness of breath, bilateral leg edema EXAM: BILATERAL LOWER EXTREMITY VENOUS DOPPLER ULTRASOUND TECHNIQUE: Gray-scale sonography with compression, as well as color and duplex ultrasound, were performed to evaluate the deep venous system(s) from the level of the common femoral vein through the popliteal and proximal  calf veins. COMPARISON:  None. FINDINGS: VENOUS Normal compressibility of the common femoral, superficial femoral, and popliteal veins, as well as the visualized calf veins. Visualized portions of profunda femoral vein and great saphenous vein unremarkable. No filling defects to suggest DVT on grayscale or color Doppler imaging. Doppler waveforms show normal direction of venous flow, normal respiratory plasticity and response to augmentation. OTHER None. Limitations: none IMPRESSION: Negative. Electronically Signed   By: Julian Hy M.D.   On: 06/13/2021 01:21   US Venous Img Lower Unilateral Left  Result Date: 07/03/2021 CLINICAL DATA:  Left thigh pain. EXAM: LEFT LOWER EXTREMITY VENOUS DOPPLER ULTRASOUND TECHNIQUE: Gray-scale sonography with compression, as well as color and duplex ultrasound, were performed  to evaluate the deep venous system(s) from the level of the common femoral vein through the popliteal and proximal calf veins. COMPARISON:  None. FINDINGS: VENOUS Normal compressibility of the common femoral, superficial femoral, and popliteal veins, as well as the visualized calf veins. Visualized portions of profunda femoral vein and great saphenous vein unremarkable. No filling defects to suggest DVT on grayscale or color Doppler imaging. Doppler waveforms show normal direction of venous flow, normal respiratory plasticity and response to augmentation. Limited views of the contralateral common femoral vein are unremarkable. OTHER None. Limitations: none IMPRESSION: Negative. Electronically Signed   By: Audie Pinto M.D.   On: 07/03/2021 16:19   DG Chest Port 1 View  Result Date: 06/17/2021 CLINICAL DATA:  Shortness of breath. EXAM: PORTABLE CHEST 1 VIEW COMPARISON:  Chest radiograph dated 06/12/2021. FINDINGS: No focal consolidation, pleural effusion, or pneumothorax. Mild cardiomegaly. No acute osseous pathology. IMPRESSION: No acute cardiopulmonary process. Mild cardiomegaly.  Electronically Signed   By: Anner Crete M.D.   On: 06/17/2021 20:42   DG Chest Portable 1 View  Result Date: 06/12/2021 CLINICAL DATA:  Shortness of breath and wheezing EXAM: PORTABLE CHEST 1 VIEW COMPARISON:  Radiograph 12/04/2020 FINDINGS: Stable heart size and mediastinal contours. The lungs are hyperinflated. There is slight increased bronchial thickening from prior exam. No focal airspace disease. Blunting of the costophrenic angles felt to be due to hyperinflation rather than pleural effusion. No pneumothorax. Remote right rib fracture. Thoracic spine spondylosis. IMPRESSION: Chronic hyperinflation. Slight increased bronchial thickening from prior exam may be acute bronchitis or asthma. Electronically Signed   By: Keith Rake M.D.   On: 06/12/2021 18:48   DG Foot 2 Views Right  Result Date: 06/14/2021 CLINICAL DATA:  Pain and swelling in the right foot EXAM: RIGHT FOOT - 2 VIEW COMPARISON:  None. FINDINGS: No acute fracture or dislocation identified. Degenerative changes at the medial aspect of the first metatarsophalangeal joint occluding small erosive change which could be seen with gout. Dorsal osteophytes in the midfoot. Plantar calcaneal spur. Calcific densities in the region of the proximal plantar fascia as well as in the distal Achilles tendon. Mild soft tissue swelling of the foot. IMPRESSION: 1. No acute osseous abnormality identified. 2. Multiple chronic findings as described. Electronically Signed   By: Ofilia Neas M.D.   On: 06/14/2021 15:06   ECHOCARDIOGRAM COMPLETE  Result Date: 06/13/2021    ECHOCARDIOGRAM REPORT   Patient Name:   Matthew [name] Date of Exam: 06/13/2021 Medical Rec #:  [id number]      Height:       74.0 in Accession #:    [id number]     Weight:       300.0 lb Date of Birth:  1951/02/28       BSA:          2.582 m Patient Age:    21 years       BP:           120/87 mmHg Patient Gender: M              HR:           91 bpm. Exam Location:  [facility]  Procedure: 2D Echo, Color Doppler and Cardiac Doppler Indications:     CHF-ACUTE SYSTOLIC 371.69 / C78.93  History:         Patient has prior history of Echocardiogram examinations, most  recent 10/16/2018. Signs/Symptoms:Edema; Risk                  Factors:Hypertension.  Sonographer:     Sherrie Sport Referring Phys:  [id number] Loletha Grayer Diagnosing Phys: Donnelly Angelica  Sonographer Comments: Suboptimal apical window. IMPRESSIONS  1. AF with RVR precludes accurate EF assessment. Left ventricular ejection fraction, by estimation, is 50 to 55%. The left ventricle has low normal function. The left ventricle has no regional wall motion abnormalities. Left ventricular diastolic parameters are indeterminate.  2. Right ventricular systolic function is normal. The right ventricular size is normal.  3. Left atrial size was mildly dilated.  4. The mitral valve is normal in structure. Trivial mitral valve regurgitation.  5. The aortic valve is normal in structure. Aortic valve regurgitation is not visualized. No aortic stenosis is present.  6. Aortic dilatation noted. There is mild dilatation of the aortic root, measuring 41 mm. FINDINGS  Left Ventricle: AF with RVR precludes accurate EF assessment. Left ventricular ejection fraction, by estimation, is 50 to 55%. The left ventricle has low normal function. The left ventricle has no regional wall motion abnormalities. The left ventricular  internal cavity size was normal in size. There is borderline left ventricular hypertrophy. Left ventricular diastolic parameters are indeterminate. Right Ventricle: The right ventricular size is normal. Right vetricular wall thickness was not well visualized. Right ventricular systolic function is normal. Left Atrium: Left atrial size was mildly dilated. Right Atrium: Right atrial size was normal in size. Pericardium: There is no evidence of pericardial effusion. Mitral Valve: The mitral valve is normal in structure. Trivial mitral  valve regurgitation. Tricuspid Valve: The tricuspid valve is not well visualized. Tricuspid valve regurgitation is not demonstrated. Aortic Valve: The aortic valve is normal in structure. Aortic valve regurgitation is not visualized. No aortic stenosis is present. Aortic valve mean gradient measures 2.0 mmHg. Aortic valve peak gradient measures 3.7 mmHg. Aortic valve area, by VTI measures 2.88 cm. Pulmonic Valve: The pulmonic valve was not well visualized. Pulmonic valve regurgitation is not visualized. No evidence of pulmonic stenosis. Aorta: Aortic dilatation noted. There is mild dilatation of the aortic root, measuring 41 mm. IAS/Shunts: The interatrial septum was not well visualized.  LEFT VENTRICLE PLAX 2D LVIDd:         6.00 cm LVIDs:         4.30 cm LV PW:         1.10 cm LV IVS:        1.00 cm LVOT diam:     2.00 cm LV SV:         35 LV SV Index:   14 LVOT Area:     3.14 cm  RIGHT VENTRICLE RV Basal diam:  2.40 cm RV S prime:     12.50 cm/s TAPSE (M-mode): 4.7 cm LEFT ATRIUM              Index        RIGHT ATRIUM           Index LA diam:        3.80 cm  1.47 cm/m   RA Area:     25.80 cm LA Vol (A2C):   137.0 ml 53.06 ml/m  RA Volume:   74.50 ml  28.85 ml/m LA Vol (A4C):   53.0 ml  20.53 ml/m LA Biplane Vol: 88.1 ml  34.12 ml/m  AORTIC VALVE  PULMONIC VALVE AV Area (Vmax):    2.26 cm     PV Vmax:        0.67 m/s AV Area (Vmean):   2.24 cm     PV Vmean:       46.150 cm/s AV Area (VTI):     2.88 cm     PV VTI:         0.104 m AV Vmax:           96.10 cm/s   PV Peak grad:   1.8 mmHg AV Vmean:          69.500 cm/s  PV Mean grad:   1.0 mmHg AV VTI:            0.122 m      RVOT Peak grad: 3 mmHg AV Peak Grad:      3.7 mmHg AV Mean Grad:      2.0 mmHg LVOT Vmax:         69.20 cm/s LVOT Vmean:        49.500 cm/s LVOT VTI:          0.112 m LVOT/AV VTI ratio: 0.92  AORTA Ao Root diam: 4.10 cm MITRAL VALVE               TRICUSPID VALVE MV Area (PHT): 7.59 cm    TR Peak grad:   14.3 mmHg MV  Decel Time: 100 msec    TR Vmax:        189.00 cm/s MV E velocity: 92.50 cm/s                            SHUNTS                            Systemic VTI:  0.11 m                            Systemic Diam: 2.00 cm                            Pulmonic VTI:  0.115 m Donnelly Angelica Electronically signed by Donnelly Angelica Signature Date/Time: 06/13/2021/12:14:34 PM    Final       Labs: BNP (last 3 results) Recent Labs    06/12/21 1834 06/17/21 2041 07/03/21 1530  BNP 279.6* 181.2* 433.2*   Basic Metabolic Panel: Recent Labs  Lab 07/03/21 1530 07/04/21 0715 07/05/21 0329 07/05/21 1336 07/06/21 0431 07/07/21 0520  NA 135 136 140  --  137 135  K 5.6* 5.1 5.9* 4.9 4.2 4.4  CL 107 105 108  --  107 105  CO2 23 23 26   --  24 25  GLUCOSE 111* 128* 121*  --  90 95  BUN 44* 45* 51*  --  59* 63*  CREATININE 2.21* 2.27* 2.36*  --  2.28* 2.37*  CALCIUM 8.7* 8.8* 9.2  --  8.3* 7.8*  MG 1.7 2.3 2.3  --   --  1.9   Liver Function Tests: No results for input(s): AST, ALT, ALKPHOS, BILITOT, PROT, ALBUMIN in the last 168 hours. No results for input(s): LIPASE, AMYLASE in the last 168 hours. No results for input(s): AMMONIA in the last 168 hours. CBC: Recent Labs  Lab 07/03/21 1530 07/05/21 0329 07/07/21 0520  WBC 8.8 7.7 5.5  NEUTROABS  --  6.9  --   HGB 8.7* 8.1* 7.9*  HCT 28.3* 26.7* 26.7*  MCV 106.0* 106.4* 105.1*  PLT 169 153 157   Cardiac Enzymes: No results for input(s): CKTOTAL, CKMB, CKMBINDEX, TROPONINI in the last 168 hours. BNP: Invalid input(s): POCBNP CBG: No results for input(s): GLUCAP in the last 168 hours. D-Dimer No results for input(s): DDIMER in the last 72 hours. Hgb A1c No results for input(s): HGBA1C in the last 72 hours. Lipid Profile No results for input(s): CHOL, HDL, LDLCALC, TRIG, CHOLHDL, LDLDIRECT in the last 72 hours. Thyroid function studies No results for input(s): TSH, T4TOTAL, T3FREE, THYROIDAB in the last 72 hours.  Invalid input(s): FREET3 Anemia  work up Recent Labs    07/05/21 0329 07/05/21 0825  VITAMINB12  --  337  TIBC 199*  --   IRON 63  --    Urinalysis    Component Value Date/Time   COLORURINE YELLOW (A) 06/13/2021 0319   APPEARANCEUR HAZY (A) 06/13/2021 0319   LABSPEC 1.010 06/13/2021 0319   PHURINE 5.0 06/13/2021 0319   GLUCOSEU NEGATIVE 06/13/2021 0319   HGBUR SMALL (A) 06/13/2021 0319   BILIRUBINUR NEGATIVE 06/13/2021 0319   KETONESUR NEGATIVE 06/13/2021 0319   PROTEINUR NEGATIVE 06/13/2021 0319   NITRITE NEGATIVE 06/13/2021 0319   LEUKOCYTESUR TRACE (A) 06/13/2021 0319   Sepsis Labs Invalid input(s): PROCALCITONIN,  WBC,  LACTICIDVEN Microbiology Recent Results (from the past 240 hour(s))  Resp Panel by RT-PCR (Flu A&B, Covid) Nasopharyngeal Swab     Status: None   Collection Time: 07/03/21  4:21 PM   Specimen: Nasopharyngeal Swab; Nasopharyngeal(NP) swabs in vial transport medium  Result Value Ref Range Status   SARS Coronavirus 2 by RT PCR NEGATIVE NEGATIVE Final    Comment: (NOTE) SARS-CoV-2 target nucleic acids are NOT DETECTED.  The SARS-CoV-2 RNA is generally detectable in upper respiratory specimens during the acute phase of infection. The lowest concentration of SARS-CoV-2 viral copies this assay can detect is 138 copies/mL. A negative result does not preclude SARS-Cov-2 infection and should not be used as the sole basis for treatment or other patient management decisions. A negative result may occur with  improper specimen collection/handling, submission of specimen other than nasopharyngeal swab, presence of viral mutation(s) within the areas targeted by this assay, and inadequate number of viral copies(<138 copies/mL). A negative result must be combined with clinical observations, patient history, and epidemiological information. The expected result is Negative.  Fact Sheet for Patients:  EntrepreneurPulse.com.au  Fact Sheet for Healthcare Providers:   IncredibleEmployment.be  This test is no t yet approved or cleared by the [place] FDA and  has been authorized for detection and/or diagnosis of SARS-CoV-2 by FDA under an Emergency Use Authorization (EUA). This EUA will remain  in effect (meaning this test can be used) for the duration of the COVID-19 declaration under Section 564(b)(1) of the Act, 21 U.S.C.section 360bbb-3(b)(1), unless the authorization is terminated  or revoked sooner.       Influenza A by PCR NEGATIVE NEGATIVE Final   Influenza B by PCR NEGATIVE NEGATIVE Final    Comment: (NOTE) The Xpert Xpress SARS-CoV-2/FLU/RSV plus assay is intended as an aid in the diagnosis of influenza from Nasopharyngeal swab specimens and should not be used as a sole basis for treatment. Nasal washings and aspirates are unacceptable for Xpert Xpress SARS-CoV-2/FLU/RSV testing.  Fact Sheet for Patients: EntrepreneurPulse.com.au  Fact Sheet for Healthcare Providers: IncredibleEmployment.be  This test is not yet approved or cleared by the [place]  States FDA and has been authorized for detection and/or diagnosis of SARS-CoV-2 by FDA under an Emergency Use Authorization (EUA). This EUA will remain in effect (meaning this test can be used) for the duration of the COVID-19 declaration under Section 564(b)(1) of the Act, 21 U.S.C. section 360bbb-3(b)(1), unless the authorization is terminated or revoked.  Performed at [facility], [address] [place], [place] [address]      Total time spend on discharging this patient, including the last patient exam, discussing the hospital stay, instructions for ongoing care as it relates to all pertinent caregivers, as well as preparing the medical discharge records, prescriptions, and/or referrals as applicable, is 50 minutes.    Enzo Bi, MD  [facility] 07/07/2021, 9:57 AM

## 2021-07-07 NOTE — Plan of Care (Signed)

## 2021-07-11 ENCOUNTER — Encounter: Payer: Self-pay | Admitting: Internal Medicine

## 2021-07-11 ENCOUNTER — Other Ambulatory Visit: Payer: Self-pay

## 2021-07-11 ENCOUNTER — Inpatient Hospital Stay
Admission: EM | Admit: 2021-07-11 | Discharge: 2021-07-18 | DRG: 308 | Disposition: A | Discharge status: Skilled Nursing Facility | Payer: Medicare HMO | Attending: Internal Medicine | Admitting: Internal Medicine

## 2021-07-11 ENCOUNTER — Emergency Department: Payer: Medicare HMO

## 2021-07-11 DIAGNOSIS — R000 Tachycardia, unspecified: Secondary | ICD-10-CM | POA: Diagnosis not present

## 2021-07-11 DIAGNOSIS — Z96643 Presence of artificial hip joint, bilateral: Secondary | ICD-10-CM | POA: Diagnosis present

## 2021-07-11 DIAGNOSIS — I130 Hypertensive heart and chronic kidney disease with heart failure and stage 1 through stage 4 chronic kidney disease, or unspecified chronic kidney disease: Secondary | ICD-10-CM | POA: Diagnosis not present

## 2021-07-11 DIAGNOSIS — D638 Anemia in other chronic diseases classified elsewhere: Secondary | ICD-10-CM | POA: Diagnosis not present

## 2021-07-11 DIAGNOSIS — G4733 Obstructive sleep apnea (adult) (pediatric): Secondary | ICD-10-CM | POA: Diagnosis present

## 2021-07-11 DIAGNOSIS — N1832 Chronic kidney disease, stage 3b: Secondary | ICD-10-CM | POA: Diagnosis present

## 2021-07-11 DIAGNOSIS — D539 Nutritional anemia, unspecified: Secondary | ICD-10-CM | POA: Diagnosis present

## 2021-07-11 DIAGNOSIS — E669 Obesity, unspecified: Secondary | ICD-10-CM | POA: Diagnosis not present

## 2021-07-11 DIAGNOSIS — M109 Gout, unspecified: Secondary | ICD-10-CM | POA: Diagnosis not present

## 2021-07-11 DIAGNOSIS — Z20822 Contact with and (suspected) exposure to covid-19: Secondary | ICD-10-CM | POA: Diagnosis not present

## 2021-07-11 DIAGNOSIS — E875 Hyperkalemia: Secondary | ICD-10-CM | POA: Diagnosis not present

## 2021-07-11 DIAGNOSIS — J9621 Acute and chronic respiratory failure with hypoxia: Secondary | ICD-10-CM | POA: Diagnosis not present

## 2021-07-11 DIAGNOSIS — M7989 Other specified soft tissue disorders: Secondary | ICD-10-CM | POA: Diagnosis present

## 2021-07-11 DIAGNOSIS — Z79899 Other long term (current) drug therapy: Secondary | ICD-10-CM | POA: Diagnosis not present

## 2021-07-11 DIAGNOSIS — I480 Paroxysmal atrial fibrillation: Secondary | ICD-10-CM | POA: Diagnosis present

## 2021-07-11 DIAGNOSIS — R410 Disorientation, unspecified: Secondary | ICD-10-CM | POA: Diagnosis not present

## 2021-07-11 DIAGNOSIS — Z9981 Dependence on supplemental oxygen: Secondary | ICD-10-CM | POA: Diagnosis not present

## 2021-07-11 DIAGNOSIS — I5043 Acute on chronic combined systolic (congestive) and diastolic (congestive) heart failure: Secondary | ICD-10-CM | POA: Diagnosis not present

## 2021-07-11 DIAGNOSIS — N184 Chronic kidney disease, stage 4 (severe): Secondary | ICD-10-CM

## 2021-07-11 DIAGNOSIS — Z6835 Body mass index (BMI) 35.0-35.9, adult: Secondary | ICD-10-CM | POA: Diagnosis not present

## 2021-07-11 DIAGNOSIS — R404 Transient alteration of awareness: Secondary | ICD-10-CM | POA: Diagnosis not present

## 2021-07-11 DIAGNOSIS — Z7901 Long term (current) use of anticoagulants: Secondary | ICD-10-CM | POA: Diagnosis not present

## 2021-07-11 DIAGNOSIS — R0689 Other abnormalities of breathing: Secondary | ICD-10-CM | POA: Diagnosis not present

## 2021-07-11 DIAGNOSIS — J449 Chronic obstructive pulmonary disease, unspecified: Secondary | ICD-10-CM | POA: Diagnosis present

## 2021-07-11 DIAGNOSIS — R0902 Hypoxemia: Secondary | ICD-10-CM | POA: Diagnosis not present

## 2021-07-11 DIAGNOSIS — D519 Vitamin B12 deficiency anemia, unspecified: Secondary | ICD-10-CM

## 2021-07-11 DIAGNOSIS — I2510 Atherosclerotic heart disease of native coronary artery without angina pectoris: Secondary | ICD-10-CM | POA: Diagnosis present

## 2021-07-11 DIAGNOSIS — J9600 Acute respiratory failure, unspecified whether with hypoxia or hypercapnia: Secondary | ICD-10-CM | POA: Diagnosis present

## 2021-07-11 DIAGNOSIS — Z9049 Acquired absence of other specified parts of digestive tract: Secondary | ICD-10-CM | POA: Diagnosis not present

## 2021-07-11 DIAGNOSIS — Z7982 Long term (current) use of aspirin: Secondary | ICD-10-CM

## 2021-07-11 DIAGNOSIS — I5030 Unspecified diastolic (congestive) heart failure: Secondary | ICD-10-CM | POA: Diagnosis not present

## 2021-07-11 DIAGNOSIS — R609 Edema, unspecified: Secondary | ICD-10-CM | POA: Diagnosis not present

## 2021-07-11 DIAGNOSIS — F32A Depression, unspecified: Secondary | ICD-10-CM | POA: Diagnosis not present

## 2021-07-11 DIAGNOSIS — J9602 Acute respiratory failure with hypercapnia: Secondary | ICD-10-CM | POA: Diagnosis not present

## 2021-07-11 DIAGNOSIS — I4891 Unspecified atrial fibrillation: Principal | ICD-10-CM

## 2021-07-11 DIAGNOSIS — R519 Headache, unspecified: Secondary | ICD-10-CM | POA: Diagnosis not present

## 2021-07-11 DIAGNOSIS — Z7401 Bed confinement status: Secondary | ICD-10-CM | POA: Diagnosis not present

## 2021-07-11 DIAGNOSIS — I5033 Acute on chronic diastolic (congestive) heart failure: Secondary | ICD-10-CM | POA: Diagnosis not present

## 2021-07-11 DIAGNOSIS — E538 Deficiency of other specified B group vitamins: Secondary | ICD-10-CM | POA: Diagnosis present

## 2021-07-11 DIAGNOSIS — J9601 Acute respiratory failure with hypoxia: Secondary | ICD-10-CM | POA: Diagnosis not present

## 2021-07-11 DIAGNOSIS — I4819 Other persistent atrial fibrillation: Principal | ICD-10-CM | POA: Diagnosis present

## 2021-07-11 DIAGNOSIS — R52 Pain, unspecified: Secondary | ICD-10-CM | POA: Diagnosis not present

## 2021-07-11 DIAGNOSIS — I959 Hypotension, unspecified: Secondary | ICD-10-CM | POA: Diagnosis present

## 2021-07-11 DIAGNOSIS — D649 Anemia, unspecified: Secondary | ICD-10-CM | POA: Diagnosis not present

## 2021-07-11 DIAGNOSIS — R0602 Shortness of breath: Secondary | ICD-10-CM | POA: Diagnosis not present

## 2021-07-11 LAB — CBC WITH DIFFERENTIAL/PLATELET
Abs Immature Granulocytes: 0.05 10*3/uL (ref 0.00–0.07)
Basophils Absolute: 0 10*3/uL (ref 0.0–0.1)
Basophils Relative: 0 %
Eosinophils Absolute: 0.1 10*3/uL (ref 0.0–0.5)
Eosinophils Relative: 2 %
HCT: 26.9 % — ABNORMAL LOW (ref 39.0–52.0)
Hemoglobin: 8.1 g/dL — ABNORMAL LOW (ref 13.0–17.0)
Immature Granulocytes: 1 %
Lymphocytes Relative: 19 %
Lymphs Abs: 1 10*3/uL (ref 0.7–4.0)
MCH: 32 pg (ref 26.0–34.0)
MCHC: 30.1 g/dL (ref 30.0–36.0)
MCV: 106.3 fL — ABNORMAL HIGH (ref 80.0–100.0)
Monocytes Absolute: 1 10*3/uL (ref 0.1–1.0)
Monocytes Relative: 19 %
Neutro Abs: 3 10*3/uL (ref 1.7–7.7)
Neutrophils Relative %: 59 %
Platelets: 240 10*3/uL (ref 150–400)
RBC: 2.53 MIL/uL — ABNORMAL LOW (ref 4.22–5.81)
RDW: 14.6 % (ref 11.5–15.5)
WBC: 5.1 10*3/uL (ref 4.0–10.5)
nRBC: 0 % (ref 0.0–0.2)

## 2021-07-11 LAB — BASIC METABOLIC PANEL
Anion gap: 6 (ref 5–15)
BUN: 45 mg/dL — ABNORMAL HIGH (ref 8–23)
CO2: 23 mmol/L (ref 22–32)
Calcium: 8.5 mg/dL — ABNORMAL LOW (ref 8.9–10.3)
Chloride: 108 mmol/L (ref 98–111)
Creatinine, Ser: 2.28 mg/dL — ABNORMAL HIGH (ref 0.61–1.24)
GFR, Estimated: 30 mL/min — ABNORMAL LOW (ref >60)
Glucose, Bld: 102 mg/dL — ABNORMAL HIGH (ref 70–99)
Potassium: 5 mmol/L (ref 3.5–5.1)
Sodium: 137 mmol/L (ref 135–145)

## 2021-07-11 LAB — RESP PANEL BY RT-PCR (FLU A&B, COVID) ARPGX2
Influenza A by PCR: NEGATIVE
Influenza B by PCR: NEGATIVE
SARS Coronavirus 2 by RT PCR: NEGATIVE

## 2021-07-11 LAB — BRAIN NATRIURETIC PEPTIDE: B Natriuretic Peptide: 95.6 pg/mL (ref 0.0–100.0)

## 2021-07-11 LAB — TROPONIN I (HIGH SENSITIVITY)
Troponin I (High Sensitivity): 12 ng/L (ref <18)
Troponin I (High Sensitivity): 11 ng/L (ref <18)

## 2021-07-11 MED ORDER — PREDNISONE 20 MG PO TABS
60.0000 mg | ORAL_TABLET | Freq: Every day | ORAL | Status: DC
  Administered 2021-07-11 – 2021-07-12 (×2): 60 mg via ORAL
  Filled 2021-07-11 (×2): qty 3

## 2021-07-11 MED ORDER — COLCHICINE 0.6 MG PO TABS
0.3000 mg | ORAL_TABLET | Freq: Every day | ORAL | Status: DC
  Administered 2021-07-12 – 2021-07-18 (×7): 0.3 mg via ORAL
  Filled 2021-07-11: qty 1
  Filled 2021-07-11 (×2): qty 0.5
  Filled 2021-07-11: qty 1
  Filled 2021-07-11: qty 0.5
  Filled 2021-07-11: qty 1
  Filled 2021-07-11 (×2): qty 0.5
  Filled 2021-07-11: qty 1
  Filled 2021-07-11: qty 0.5
  Filled 2021-07-11 (×2): qty 1
  Filled 2021-07-11: qty 0.5

## 2021-07-11 MED ORDER — METOPROLOL TARTRATE 25 MG PO TABS: 12.5000 mg | ORAL_TABLET | Freq: Four times a day (QID) | ORAL | Status: DC

## 2021-07-11 MED ORDER — DILTIAZEM LOAD VIA INFUSION
10.0000 mg | Freq: Once | INTRAVENOUS | Status: AC
  Administered 2021-07-11: 17:00:00 10 mg via INTRAVENOUS
  Filled 2021-07-11: qty 10

## 2021-07-11 MED ORDER — POLYETHYLENE GLYCOL 3350 17 G PO PACK: 17.0000 g | PACK | Freq: Every day | ORAL | Status: DC | PRN

## 2021-07-11 MED ORDER — MELATONIN 5 MG PO TABS
5.0000 mg | ORAL_TABLET | Freq: Every day | ORAL | Status: DC
  Administered 2021-07-11 – 2021-07-17 (×7): 5 mg via ORAL
  Filled 2021-07-11 (×7): qty 1

## 2021-07-11 MED ORDER — ALBUTEROL SULFATE (2.5 MG/3ML) 0.083% IN NEBU
3.0000 mL | INHALATION_SOLUTION | RESPIRATORY_TRACT | Status: DC | PRN
  Administered 2021-07-11 – 2021-07-13 (×2): 3 mL via RESPIRATORY_TRACT
  Filled 2021-07-11 (×2): qty 3

## 2021-07-11 MED ORDER — PANTOPRAZOLE SODIUM 40 MG PO TBEC
40.0000 mg | DELAYED_RELEASE_TABLET | Freq: Every day | ORAL | Status: DC
  Administered 2021-07-11 – 2021-07-18 (×8): 40 mg via ORAL
  Filled 2021-07-11 (×8): qty 1

## 2021-07-11 MED ORDER — MOMETASONE FURO-FORMOTEROL FUM 200-5 MCG/ACT IN AERO
2.0000 | INHALATION_SPRAY | Freq: Two times a day (BID) | RESPIRATORY_TRACT | Status: DC
  Administered 2021-07-11 – 2021-07-18 (×14): 2 via RESPIRATORY_TRACT
  Filled 2021-07-11 (×2): qty 8.8

## 2021-07-11 MED ORDER — DULOXETINE HCL 30 MG PO CPEP
60.0000 mg | ORAL_CAPSULE | Freq: Two times a day (BID) | ORAL | Status: DC
  Administered 2021-07-11 – 2021-07-18 (×14): 60 mg via ORAL
  Filled 2021-07-11 (×12): qty 2
  Filled 2021-07-11 (×2): qty 1

## 2021-07-11 MED ORDER — DILTIAZEM HCL-DEXTROSE 125-5 MG/125ML-% IV SOLN (PREMIX)
5.0000 mg/h | INTRAVENOUS | Status: DC
  Administered 2021-07-11: 17:00:00 5 mg/h via INTRAVENOUS
  Filled 2021-07-11 (×2): qty 125

## 2021-07-11 MED ORDER — ADULT MULTIVITAMIN W/MINERALS CH
1.0000 | ORAL_TABLET | Freq: Every day | ORAL | Status: DC
  Administered 2021-07-12 – 2021-07-18 (×7): 1 via ORAL
  Filled 2021-07-11 (×7): qty 1

## 2021-07-11 MED ORDER — ONDANSETRON HCL 4 MG/2ML IJ SOLN
4.0000 mg | Freq: Four times a day (QID) | INTRAMUSCULAR | Status: DC | PRN
  Administered 2021-07-18: 4 mg via INTRAVENOUS
  Filled 2021-07-11: qty 2

## 2021-07-11 MED ORDER — ALLOPURINOL 100 MG PO TABS
100.0000 mg | ORAL_TABLET | ORAL | Status: DC | PRN
  Administered 2021-07-18: 100 mg via ORAL
  Filled 2021-07-11 (×4): qty 1

## 2021-07-11 MED ORDER — IPRATROPIUM-ALBUTEROL 0.5-2.5 (3) MG/3ML IN SOLN
3.0000 mL | Freq: Four times a day (QID) | RESPIRATORY_TRACT | Status: DC | PRN
  Administered 2021-07-15 – 2021-07-16 (×2): 3 mL via RESPIRATORY_TRACT
  Filled 2021-07-11 (×2): qty 3

## 2021-07-11 MED ORDER — METOPROLOL TARTRATE 25 MG PO TABS: 12.5000 mg | ORAL_TABLET | Freq: Every day | ORAL | Status: DC

## 2021-07-11 MED ORDER — BISACODYL 5 MG PO TBEC: 5.0000 mg | DELAYED_RELEASE_TABLET | Freq: Every day | ORAL | Status: DC | PRN

## 2021-07-11 MED ORDER — SODIUM CHLORIDE 0.9 % IV SOLN
10.0000 mL/h | Freq: Once | INTRAVENOUS | Status: AC
  Administered 2021-07-11: 21:00:00 10 mL/h via INTRAVENOUS

## 2021-07-11 MED ORDER — MIRTAZAPINE 15 MG PO TABS
15.0000 mg | ORAL_TABLET | Freq: Every day | ORAL | Status: DC
  Administered 2021-07-11 – 2021-07-17 (×7): 15 mg via ORAL
  Filled 2021-07-11 (×7): qty 1

## 2021-07-11 MED ORDER — ACETAMINOPHEN 325 MG PO TABS
650.0000 mg | ORAL_TABLET | ORAL | Status: DC | PRN
  Administered 2021-07-12 – 2021-07-16 (×7): 650 mg via ORAL
  Filled 2021-07-11 (×8): qty 2

## 2021-07-11 MED ORDER — TAMSULOSIN HCL 0.4 MG PO CAPS
0.4000 mg | ORAL_CAPSULE | Freq: Every day | ORAL | Status: DC
  Administered 2021-07-11 – 2021-07-17 (×7): 0.4 mg via ORAL
  Filled 2021-07-11 (×7): qty 1

## 2021-07-11 MED ORDER — METOPROLOL TARTRATE 25 MG PO TABS
12.5000 mg | ORAL_TABLET | Freq: Four times a day (QID) | ORAL | Status: DC
  Administered 2021-07-11 – 2021-07-12 (×3): 12.5 mg via ORAL
  Filled 2021-07-11 (×6): qty 1

## 2021-07-11 NOTE — ED Notes (Signed)
Pt given coke at this time, blood infusing, Denies other needs .

## 2021-07-11 NOTE — ED Provider Notes (Signed)
[facility] Emergency Department Provider Note   ____________________________________________   Event Date/Time   First MD Initiated Contact with Patient 07/11/21 1501     (approximate)  I have reviewed the triage vital signs and the nursing notes.   HISTORY  Chief Complaint Shortness of Breath and Leg Swelling    HPI Matthew [name] is a 70 y.o. male who presents for increasing shortness of breath and bilateral lower extremity edema over the last 1 week  LOCATION: Chest and bilateral lower extremities DURATION: 1 week prior to arrival TIMING: Worsening since onset SEVERITY: Severe QUALITY: Shortness of breath and edema CONTEXT: Patient states that over the past week he has had worsening bilateral lower extremity edema as well as worsening shortness of breath that has required increasing his normal 2 L of oxygen by nasal cannula up to 4 L MODIFYING FACTORS: Worsened with exertion and partially relieved at rest ASSOCIATED SYMPTOMS: Chest tightness   Per medical record review, patient has history of CAD, A. fib, and heart failure          Past Medical History:  Diagnosis Date   Asthma    CAD (coronary artery disease)    Edema    Hip dislocation, bilateral ([place])    Hypertension     Patient Active Problem List   Diagnosis Date Noted   Atrial fibrillation with rapid ventricular response ([place]) 07/11/2021   Symptomatic anemia 07/11/2021   Asthma exacerbation 07/03/2021   Chest pain 07/03/2021   Acute gouty arthritis    Acute diastolic congestive heart failure ([place])    Right leg pain    Weakness    SOB (shortness of breath) 06/12/2021   Bilateral leg edema 06/12/2021   Atrial fibrillation with RVR ([place]) 06/12/2021   AKI (acute kidney injury) ([place]) 06/12/2021   Elevated glucose level 06/12/2021   Obesity (BMI 30-39.9) 06/12/2021   Macrocytosis 06/12/2021   Acute respiratory failure ([place]) 06/12/2021   Chronic pain of left knee 12/16/2020    COPD with acute exacerbation ([place]) 12/04/2020   Acute right hip pain 12/04/2020   H/O bilateral hip replacements 12/04/2020   Stage 3b chronic kidney disease ([place]) 12/04/2020   Displaced comminuted fracture of shaft of left femur, initial encounter for closed fracture ([place]) 05/19/2020   Asthma 05/18/2020   Essential hypertension 05/18/2020   Femur fracture, left ([place]) 05/18/2020   Closed comminuted intra-articular fracture of distal femur ([place]) 05/17/2020   Moderate persistent asthma with exacerbation 10/15/2018    Past Surgical History:  Procedure Laterality Date   ABDOMINAL SURGERY     CHOLECYSTECTOMY     COLOSTOMY     COLOSTOMY TAKEDOWN     HERNIA REPAIR     LEFT HEART CATH AND CORONARY ANGIOGRAPHY N/A 10/17/2018   Procedure: LEFT HEART CATH AND CORONARY ANGIOGRAPHY with possible PCI and stent;  Surgeon: Yolonda Kida, MD;  Location: [facility];  Service: Cardiovascular;  Laterality: N/A;   ORIF FEMUR FRACTURE Left 05/19/2020   Procedure: OPEN REDUCTION INTERNAL FIXATION (ORIF) DISTAL FEMUR FRACTURE;  Surgeon: Shona Needles, MD;  Location: [place];  Service: Orthopedics;  Laterality: Left;   TOTAL HIP ARTHROPLASTY Bilateral     Prior to Admission medications   Medication Sig Start Date End Date Taking? Authorizing Provider  albuterol (VENTOLIN HFA) 108 (90 Base) MCG/ACT inhaler Inhale 2 puffs into the lungs every 6 (six) hours as needed for wheezing or shortness of breath.   Yes [provider]  apixaban (  ELIQUIS) 5 MG TABS tablet Take 1 tablet (5 mg total) by mouth 2 (two) times daily. 07/07/21 10/05/21 Yes Enzo Bi, MD  diltiazem (CARDIZEM CD) 300 MG 24 hr capsule Take 1 capsule (300 mg total) by mouth daily. 07/07/21 10/05/21 Yes Enzo Bi, MD  DULoxetine (CYMBALTA) 60 MG capsule Take 1 capsule (60 mg total) by mouth 2 (two) times daily. 06/15/21  Yes Masoud, Viann Shove, MD  melatonin 5 MG TABS Take 1 tablet (5 mg total) by mouth at bedtime. 06/23/21  Yes  Richarda Osmond, MD  metoprolol tartrate (LOPRESSOR) 25 MG tablet Take 1 tablet (25 mg total) by mouth 2 (two) times daily. 07/07/21 10/05/21 Yes Enzo Bi, MD  mirtazapine (REMERON) 15 MG tablet Take 15 mg by mouth at bedtime.   Yes [provider]  mometasone-formoterol (DULERA) 200-5 MCG/ACT AERO Inhale 2 puffs into the lungs 2 (two) times daily. 07/07/21  Yes Enzo Bi, MD  Multiple Vitamin (MULTIVITAMIN WITH MINERALS) TABS tablet Take 1 tablet by mouth daily. 07/07/21  Yes Enzo Bi, MD  tamsulosin (FLOMAX) 0.4 MG CAPS capsule Take 1 capsule (0.4 mg total) by mouth daily after supper. 07/07/21 10/05/21 Yes Enzo Bi, MD  allopurinol (ZYLOPRIM) 100 MG tablet Take 1 tablet (100 mg total) by mouth as needed (gout). Patient taking differently: Take 100 mg by mouth daily. 06/23/21   Richarda Osmond, MD  aspirin EC 81 MG EC tablet Take 1 tablet (81 mg total) by mouth daily. Swallow whole. 06/24/21   Richarda Osmond, MD  bisacodyl (DULCOLAX) 5 MG EC tablet Take 1 tablet (5 mg total) by mouth daily as needed for moderate constipation. 06/23/21   Richarda Osmond, MD  colchicine 0.6 MG tablet Take 0.5 tablets (0.3 mg total) by mouth daily. 06/24/21   Richarda Osmond, MD  ipratropium-albuterol (DUONEB) 0.5-2.5 (3) MG/3ML SOLN Take 3 mLs by nebulization every 6 (six) hours as needed. 07/07/21   Enzo Bi, MD  polyethylene glycol (MIRALAX / GLYCOLAX) 17 g packet Take 17 g by mouth daily as needed for mild constipation. 06/23/21   Richarda Osmond, MD    Allergies Demerol [meperidine hcl] and Lisinopril  Family History  Problem Relation Age of Onset   COPD Mother    Cancer Mother    Melanoma Father     Social History Social History   Tobacco Use   Smoking status: Never   Smokeless tobacco: Never  Vaping Use   Vaping Use: Never used  Substance Use Topics   Alcohol use: Not Currently    Review of Systems Constitutional: No fever/chills Eyes: No visual  changes. ENT: No sore throat. Cardiovascular: Endorses chest pain.  Endorses bilateral lower extremity edema Respiratory: Endorses shortness of breath. Gastrointestinal: No abdominal pain.  No nausea, no vomiting.  No diarrhea. Genitourinary: Negative for dysuria. Musculoskeletal: Negative for acute arthralgias Skin: Negative for rash. Neurological: Negative for headaches, weakness/numbness/paresthesias in any extremity Psychiatric: Negative for suicidal ideation/homicidal ideation   ____________________________________________   PHYSICAL EXAM:  VITAL SIGNS: ED Triage Vitals  Enc Vitals Group     BP 07/11/21 1321 (!) 143/130     Pulse Rate 07/11/21 1321 72     Resp 07/11/21 1321 18     Temp 07/11/21 1321 97.6 F (36.4 C)     Temp Source 07/11/21 1321 Oral     SpO2 07/11/21 1321 96 %     Weight 07/11/21 1323 280 lb (127 kg)     Height 07/11/21 1323 6\' 2"  (1.88  m)     Head Circumference --      Peak Flow --      Pain Score 07/11/21 1322 7     Pain Loc --      Pain Edu? --      Excl. in [place]? --    Constitutional: Alert and oriented.  Chronically ill appearing elderly obese Caucasian male in no acute distress. Eyes: Conjunctivae are normal. PERRL. Head: Atraumatic. Nose: No congestion/rhinnorhea. Mouth/Throat: Mucous membranes are moist. Neck: No stridor Cardiovascular: Tachycardic irregularly irregular rhythm.  Good peripheral circulation. Respiratory: 4 L nasal cannula in place.  Increased respiratory effort.  No retractions. Gastrointestinal: Soft and nontender. No distention. Musculoskeletal: No obvious deformities Neurologic:  Normal speech and language. No gross focal neurologic deficits are appreciated. Skin:  Skin is warm and dry. No rash noted. Psychiatric: Mood and affect are normal. Speech and behavior are normal.  ____________________________________________   LABS (all labs ordered are listed, but only abnormal results are displayed)  Labs Reviewed  CBC  WITH DIFFERENTIAL/PLATELET - Abnormal; Notable for the following components:      Result Value   RBC 2.53 (*)    Hemoglobin 8.1 (*)    HCT 26.9 (*)    MCV 106.3 (*)    All other components within normal limits  BASIC METABOLIC PANEL - Abnormal; Notable for the following components:   Glucose, Bld 102 (*)    BUN 45 (*)    Creatinine, Ser 2.28 (*)    Calcium 8.5 (*)    GFR, Estimated 30 (*)    All other components within normal limits  RESP PANEL BY RT-PCR (FLU A&B, COVID) ARPGX2  BRAIN NATRIURETIC PEPTIDE  BASIC METABOLIC PANEL  CBC  PREPARE RBC (CROSSMATCH)  TYPE AND SCREEN  TROPONIN I (HIGH SENSITIVITY)  TROPONIN I (HIGH SENSITIVITY)   ____________________________________________  EKG  ED ECG REPORT I, Naaman Plummer, the attending physician, personally viewed and interpreted this ECG.  Date: 07/11/2021 EKG Time: 1326 Rate: 146 Rhythm: Atrial fibrillation with rapid ventricular response QRS Axis: normal Intervals: normal ST/T Wave abnormalities: normal Narrative Interpretation: Atrial fibrillation with rapid ventricular response.  No evidence of acute ischemia  ____________________________________________  RADIOLOGY  ED MD interpretation: 2 view chest x-ray shows no evidence of acute abnormalities including no pneumonia, pneumothorax, or widened mediastinum  Official radiology report(s): DG Chest 2 View  Result Date: 07/11/2021 CLINICAL DATA:  SOB EXAM: CHEST - 2 VIEW COMPARISON:  07/03/2021 FINDINGS: Stable cardiomediastinal contours. No new consolidation or edema. No pleural effusion or pneumothorax. The visualized skeletal structures are unremarkable. IMPRESSION: No acute process in the chest. Electronically Signed   By: Macy Mis M.D.   On: 07/11/2021 14:23    ____________________________________________   PROCEDURES  Procedure(s) performed (including Critical Care):  .1-3 Lead EKG Interpretation Performed by: Naaman Plummer, MD Authorized by:  Naaman Plummer, MD     Interpretation: abnormal     ECG rate:  142   ECG rate assessment: tachycardic     Rhythm: atrial fibrillation     Ectopy: none     Conduction: normal    CRITICAL CARE Performed by: Naaman Plummer   Total critical care time: 33 minutes  Critical care time was exclusive of separately billable procedures and treating other patients.  Critical care was necessary to treat or prevent imminent or life-threatening deterioration.  Critical care was time spent personally by me on the following activities: development of treatment plan with patient and/or surrogate as  well as nursing, discussions with consultants, evaluation of patient's response to treatment, examination of patient, obtaining history from patient or surrogate, ordering and performing treatments and interventions, ordering and review of laboratory studies, ordering and review of radiographic studies, pulse oximetry and re-evaluation of patient's condition.  ____________________________________________   INITIAL IMPRESSION / ASSESSMENT AND PLAN / ED COURSE  As part of my medical decision making, I reviewed the following data within the electronic medical record, if available:  Nursing notes reviewed and incorporated, Labs reviewed, EKG interpreted, Old chart reviewed, Radiograph reviewed and Notes from prior ED visits reviewed and incorporated        + atrial fibrillation w/ RVR DDx: Pneumothorax, Pneumonia, Pulmonary Embolus, Tamponade, ACS, Thyrotoxicosis.  No history or evidence decompensated heart failure. Given their history and exam it is likely this patient is unlikely to spontaneously revert to a rate controlled rhythm and necessitates a thorough workup for their arrhythmia. Workup: ECG, CXR, CBC, BMP, UA, Troponin, BNP, TSH, Ca-Mag-Phos Interventions: Defer Cardioversion (uncertain historical reliability with time of onset, increased risk of thromboembolic stroke).  Start diltiazem bolus and  drip  Disposition: Admit      ____________________________________________   FINAL CLINICAL IMPRESSION(S) / ED DIAGNOSES  Final diagnoses:  Atrial fibrillation with RVR [facility])    ED Discharge Orders          Ordered    Amb referral to AFIB Clinic        07/11/21 1732             Note:  This document was prepared using Dragon voice recognition software and may include unintentional dictation errors.    Naaman Plummer, MD 07/11/21 [phone]

## 2021-07-11 NOTE — H&P (Addendum)
History and Physical    Matthew [name] DSK:[id number] DOB: [birth year] DOA: 07/11/2021  PCP: [name], MD   Patient coming from: Home  I have personally briefly reviewed patient's old medical records in [place]  Chief Complaint: Pain in both feet  HPI: Matthew [name] is a 70 y.o. male with medical history significant for COPD with chronic respiratory failure on 2 L of oxygen, history of atrial fibrillation on chronic anticoagulation therapy, hypertension with stage III chronic kidney disease, obesity and asthma who presents to the emergency room for evaluation of pain which started in both feet/legs and swelling for about 3 days and has progressively worsened since then.  He describes it as feeling like pains been stuck in his feet.  He also complains of worsening shortness of breath from his baseline associated with increased fatigue and weakness as well as pain across his chest. He denies having any hematemesis, no melena stools or hematochezia.  He denies any NSAID use. On 4 L of oxygen patient is noted to have a pulse oximetry of 99%. He denies having any orthopnea or paroxysmal nocturnal dyspnea, denies having any fever, no chills, no nausea, no vomiting, no abdominal pain, no headache, no urinary symptoms, no changes in his bowel habits, no focal deficits of blurred vision, no diaphoresis. Sodium 137, potassium 5.0, chloride 108, bicarb 23, glucose 102, BUN 45, creatinine 2.28, calcium 8.5, troponin 12, [place] count 5.1, hemoglobin 8.1 down from 12.13 weeks ago, hematocrit 26.9, MCV 106.3, RDW 14.6, platelet count 240 Chest x-ray reviewed by me shows no acute cardiopulmonary disease Respiratory viral panel is pending Twelve-lead EKG shows atrial fibrillation with a rapid ventricular rate  ED Course: Patient is a 70 year old male recently discharged from the hospital who presents to the ER for evaluation of pain and swelling both feet associated with shortness of breath,  increased fatigue and chest pain. Patient was found to be in A. fib with a rapid ventricular rate. Also noted to have worsening anemia with a 4 g drop in his hemoglobin over 3 weeks 12.1 >. 8.1 He was started on a Cardizem drip and during my evaluation patient is noted to be hypotensive with systolic blood pressure  of 78mmHg and tachycardic. He will be admitted to the hospital for further evaluation.   Review of Systems: As per HPI otherwise all other systems reviewed and negative.    Past Medical History:  Diagnosis Date   Asthma    CAD (coronary artery disease)    Edema    Hip dislocation, bilateral ([place])    Hypertension     Past Surgical History:  Procedure Laterality Date   ABDOMINAL SURGERY     CHOLECYSTECTOMY     COLOSTOMY     COLOSTOMY TAKEDOWN     HERNIA REPAIR     LEFT HEART CATH AND CORONARY ANGIOGRAPHY N/A 10/17/2018   Procedure: LEFT HEART CATH AND CORONARY ANGIOGRAPHY with possible PCI and stent;  Surgeon: Yolonda Kida, MD;  Location: [facility];  Service: Cardiovascular;  Laterality: N/A;   ORIF FEMUR FRACTURE Left 05/19/2020   Procedure: OPEN REDUCTION INTERNAL FIXATION (ORIF) DISTAL FEMUR FRACTURE;  Surgeon: Shona Needles, MD;  Location: [facility];  Service: Orthopedics;  Laterality: Left;   TOTAL HIP ARTHROPLASTY Bilateral      reports that he has never smoked. He has never used smokeless tobacco. He reports that he does not currently use alcohol. No history on file for drug use.  Allergies  Allergen Reactions   Demerol [Meperidine Hcl]    Lisinopril Other (See Comments)    Hypotensive     Family History  Problem Relation Age of Onset   COPD Mother    Cancer Mother    Melanoma Father       Prior to Admission medications   Medication Sig Start Date End Date Taking? Authorizing Provider  allopurinol (ZYLOPRIM) 100 MG tablet Take 1 tablet (100 mg total) by mouth as needed (gout). 06/23/21   Richarda Osmond, MD  apixaban (ELIQUIS) 5 MG  TABS tablet Take 1 tablet (5 mg total) by mouth 2 (two) times daily. 07/07/21 10/05/21  Enzo Bi, MD  aspirin EC 81 MG EC tablet Take 1 tablet (81 mg total) by mouth daily. Swallow whole. 06/24/21   Richarda Osmond, MD  bisacodyl (DULCOLAX) 5 MG EC tablet Take 1 tablet (5 mg total) by mouth daily as needed for moderate constipation. 06/23/21   Richarda Osmond, MD  colchicine 0.6 MG tablet Take 0.5 tablets (0.3 mg total) by mouth daily. 06/24/21   Richarda Osmond, MD  diltiazem (CARDIZEM CD) 300 MG 24 hr capsule Take 1 capsule (300 mg total) by mouth daily. 07/07/21 10/05/21  Enzo Bi, MD  DULoxetine (CYMBALTA) 60 MG capsule Take 1 capsule (60 mg total) by mouth 2 (two) times daily. 06/15/21   [name], MD  ipratropium-albuterol (DUONEB) 0.5-2.5 (3) MG/3ML SOLN Take 3 mLs by nebulization every 6 (six) hours as needed. 07/07/21   Enzo Bi, MD  melatonin 5 MG TABS Take 1 tablet (5 mg total) by mouth at bedtime. 06/23/21   Richarda Osmond, MD  metoprolol tartrate (LOPRESSOR) 25 MG tablet Take 1 tablet (25 mg total) by mouth 2 (two) times daily. 07/07/21 10/05/21  Enzo Bi, MD  mirtazapine (REMERON) 15 MG tablet Take 15 mg by mouth at bedtime.    [provider]  mometasone-formoterol (DULERA) 200-5 MCG/ACT AERO Inhale 2 puffs into the lungs 2 (two) times daily. 07/07/21   Enzo Bi, MD  Multiple Vitamin (MULTIVITAMIN WITH MINERALS) TABS tablet Take 1 tablet by mouth daily. 07/07/21   Enzo Bi, MD  polyethylene glycol (MIRALAX / GLYCOLAX) 17 g packet Take 17 g by mouth daily as needed for mild constipation. 06/23/21   Richarda Osmond, MD  tamsulosin (FLOMAX) 0.4 MG CAPS capsule Take 1 capsule (0.4 mg total) by mouth daily after supper. 07/07/21 10/05/21  Enzo Bi, MD    Physical Exam: Vitals:   07/11/21 1512 07/11/21 1530 07/11/21 1600 07/11/21 1630  BP: (!) 142/94 127/78 125/75 113/70  Pulse:    (!) 134  Resp: (!) 22 (!) 25 (!) 23 17  Temp:      TempSrc:       SpO2:    95%  Weight:      Height:         Vitals:   07/11/21 1512 07/11/21 1530 07/11/21 1600 07/11/21 1630  BP: (!) 142/94 127/78 125/75 113/70  Pulse:    (!) 134  Resp: (!) 22 (!) 25 (!) 23 17  Temp:      TempSrc:      SpO2:    95%  Weight:      Height:          Constitutional: Alert and oriented x 3 . Not in any apparent distress HEENT:      Head: Normocephalic and atraumatic.         Eyes: PERLA, EOMI, Conjunctivae are normal. Sclera  is non-icteric.       Mouth/Throat: Mucous membranes are moist.       Neck: Supple with no signs of meningismus. Cardiovascular: Irregularly irregular, tachycardic. No murmurs, gallops, or rubs. 2+ symmetrical distal pulses are present . No JVD. 3+ LE edema Respiratory: Tachypneic.faint wheezes in both lung fields  bilaterally. No crackles, or rhonchi.  Gastrointestinal: Soft, non tender, and non distended with positive bowel sounds.  Central adiposity Genitourinary: No CVA tenderness. Musculoskeletal: Nontender with normal range of motion in all extremities. No cyanosis, or erythema of extremities. Neurologic:  Face is symmetric. Moving all extremities. No gross focal neurologic deficits . Skin: Skin is warm, dry.  No rash or ulcers Psychiatric: Mood and affect are normal    Labs on Admission: I have personally reviewed following labs and imaging studies  CBC: Recent Labs  Lab 07/05/21 0329 07/07/21 0520 07/11/21 1325  WBC 7.7 5.5 5.1  NEUTROABS 6.9  --  3.0  HGB 8.1* 7.9* 8.1*  HCT 26.7* 26.7* 26.9*  MCV 106.4* 105.1* 106.3*  PLT 153 157 086   Basic Metabolic Panel: Recent Labs  Lab 07/05/21 0329 07/05/21 1336 07/06/21 0431 07/07/21 0520 07/11/21 1325  NA 140  --  137 135 137  K 5.9* 4.9 4.2 4.4 5.0  CL 108  --  107 105 108  CO2 26  --  24 25 23   GLUCOSE 121*  --  90 95 102*  BUN 51*  --  59* 63* 45*  CREATININE 2.36*  --  2.28* 2.37* 2.28*  CALCIUM 9.2  --  8.3* 7.8* 8.5*  MG 2.3  --   --  1.9  --     GFR: Estimated Creatinine Clearance: 42.1 mL/min (A) (by C-G formula based on SCr of 2.28 mg/dL (H)). Liver Function Tests: No results for input(s): AST, ALT, ALKPHOS, BILITOT, PROT, ALBUMIN in the last 168 hours. No results for input(s): LIPASE, AMYLASE in the last 168 hours. No results for input(s): AMMONIA in the last 168 hours. Coagulation Profile: No results for input(s): INR, PROTIME in the last 168 hours. Cardiac Enzymes: No results for input(s): CKTOTAL, CKMB, CKMBINDEX, TROPONINI in the last 168 hours. BNP (last 3 results) No results for input(s): PROBNP in the last 8760 hours. HbA1C: No results for input(s): HGBA1C in the last 72 hours. CBG: No results for input(s): GLUCAP in the last 168 hours. Lipid Profile: No results for input(s): CHOL, HDL, LDLCALC, TRIG, CHOLHDL, LDLDIRECT in the last 72 hours. Thyroid Function Tests: No results for input(s): TSH, T4TOTAL, FREET4, T3FREE, THYROIDAB in the last 72 hours. Anemia Panel: No results for input(s): VITAMINB12, FOLATE, FERRITIN, TIBC, IRON, RETICCTPCT in the last 72 hours. Urine analysis:    Component Value Date/Time   COLORURINE YELLOW (A) 06/13/2021 0319   APPEARANCEUR HAZY (A) 06/13/2021 0319   LABSPEC 1.010 06/13/2021 0319   PHURINE 5.0 06/13/2021 0319   GLUCOSEU NEGATIVE 06/13/2021 0319   HGBUR SMALL (A) 06/13/2021 0319   BILIRUBINUR NEGATIVE 06/13/2021 0319   KETONESUR NEGATIVE 06/13/2021 0319   PROTEINUR NEGATIVE 06/13/2021 0319   NITRITE NEGATIVE 06/13/2021 0319   LEUKOCYTESUR TRACE (A) 06/13/2021 0319    Radiological Exams on Admission: DG Chest 2 View  Result Date: 07/11/2021 CLINICAL DATA:  SOB EXAM: CHEST - 2 VIEW COMPARISON:  07/03/2021 FINDINGS: Stable cardiomediastinal contours. No new consolidation or edema. No pleural effusion or pneumothorax. The visualized skeletal structures are unremarkable. IMPRESSION: No acute process in the chest. Electronically Signed   By: Macy Mis  M.D.   On:  07/11/2021 14:23     Assessment/Plan Principal Problem:   Atrial fibrillation with rapid ventricular response (HCC) Active Problems:   Stage 3b chronic kidney disease ([place])   Obesity (BMI 30-39.9)   Acute respiratory failure (HCC)   Acute gouty arthritis   Symptomatic anemia     Patient is a 70 year old male who presents to the ER for evaluation of pain/swelling in both feet and found to be in rapid atrial fibrillation.    Atrial fibrillation with rapid ventricular rate Patient has a known history of A. fib and is noted to be tachycardic with heart rate in the 140s This appears to have been exacerbated by anemia as patient is noted to have a 4 g drop in his H&H in the last 3 weeks Discussed with cardiology who recommends to start patient on metoprolol 12.5 mg every 6 hours (Hold for SBP < 80) Hold apixaban for now due to symptomatic anemia We will consult cardiology     Acute on chronic respiratory failure Patient has a history of COPD with chronic respiratory failure and is on 2 L of oxygen He currently has an increased oxygen requirement and is on 4 L to maintain pulse oximetry greater than 94% Worsening respiratory failure appears to be secondary to symptomatic anemia Will attempt to wean patient down to his baseline home oxygen requirement     Symptomatic anemia Patient has had a 4 g drop in his H&H over the last 3 weeks He denies having any hematochezia, no hematemesis or melena stools Hold Eliquis and aspirin Will transfuse 1 unit of packed RBC     Acute gouty arthritis Place patient on prednisone 60 mg p.o. daily x3 doses Continue colchicine    Stage IIIb chronic kidney disease Renal function appears stable    Obesity (BMI 35) Complicates overall prognosis and care    Depression Continue Cymbalta and Remeron     COPD with chronic respiratory failure Not acutely exacerbated Continue as needed bronchodilator therapy as well as inhaled  steroids  DVT prophylaxis: SCD  Code Status: full code  Family Communication: Greater than 50% of time was spent discussing patient's condition and plan of care with him at the bedside.  All questions and concerns have been addressed.  He verbalizes understanding and agrees with the plan. Disposition Plan: Back to previous home environment Consults called: Cardiology Status:At the time of admission, it appears that the appropriate admission status for this patient is inpatient. This is judged to be reasonable and necessary to provide the required intensity of service to ensure the patient's safety given the presenting symptoms, physical exam findings, and initial radiographic and laboratory data in the context of their comorbid conditions. Patient requires inpatient status due to high intensity of service, high risk for further deterioration and high frequency of surveillance required.     Collier Bullock MD [facility]     07/11/2021, 5:34 PM

## 2021-07-11 NOTE — ED Triage Notes (Signed)
Pt here with SOB and pedal edema in both feet and legs. Pt has hx of CHF and normally wears 2L but is now up to 4L [place]. Pt also having pain across his chest. Pt in NAD in triage.   145-cbg 132/72 140 99% on 4L

## 2021-07-12 DIAGNOSIS — I4891 Unspecified atrial fibrillation: Secondary | ICD-10-CM

## 2021-07-12 DIAGNOSIS — M109 Gout, unspecified: Secondary | ICD-10-CM | POA: Diagnosis not present

## 2021-07-12 DIAGNOSIS — N184 Chronic kidney disease, stage 4 (severe): Secondary | ICD-10-CM

## 2021-07-12 DIAGNOSIS — F32A Depression, unspecified: Secondary | ICD-10-CM

## 2021-07-12 DIAGNOSIS — D649 Anemia, unspecified: Secondary | ICD-10-CM | POA: Diagnosis not present

## 2021-07-12 DIAGNOSIS — E538 Deficiency of other specified B group vitamins: Secondary | ICD-10-CM

## 2021-07-12 DIAGNOSIS — E669 Obesity, unspecified: Secondary | ICD-10-CM

## 2021-07-12 DIAGNOSIS — D638 Anemia in other chronic diseases classified elsewhere: Secondary | ICD-10-CM | POA: Diagnosis not present

## 2021-07-12 LAB — HEPATIC FUNCTION PANEL
ALT: 26 U/L (ref 0–44)
AST: 18 U/L (ref 15–41)
Albumin: 2.2 g/dL — ABNORMAL LOW (ref 3.5–5.0)
Alkaline Phosphatase: 55 U/L (ref 38–126)
Bilirubin, Direct: 0.2 mg/dL (ref 0.0–0.2)
Indirect Bilirubin: 0.5 mg/dL (ref 0.3–0.9)
Total Bilirubin: 0.7 mg/dL (ref 0.3–1.2)
Total Protein: 5.6 g/dL — ABNORMAL LOW (ref 6.5–8.1)

## 2021-07-12 LAB — CBC
HCT: 28.9 % — ABNORMAL LOW (ref 39.0–52.0)
Hemoglobin: 8.8 g/dL — ABNORMAL LOW (ref 13.0–17.0)
MCH: 31.3 pg (ref 26.0–34.0)
MCHC: 30.4 g/dL (ref 30.0–36.0)
MCV: 102.8 fL — ABNORMAL HIGH (ref 80.0–100.0)
Platelets: 247 10*3/uL (ref 150–400)
RBC: 2.81 MIL/uL — ABNORMAL LOW (ref 4.22–5.81)
RDW: 15.3 % (ref 11.5–15.5)
WBC: 3.7 10*3/uL — ABNORMAL LOW (ref 4.0–10.5)
nRBC: 0 % (ref 0.0–0.2)

## 2021-07-12 LAB — BASIC METABOLIC PANEL
Anion gap: 7 (ref 5–15)
BUN: 45 mg/dL — ABNORMAL HIGH (ref 8–23)
CO2: 21 mmol/L — ABNORMAL LOW (ref 22–32)
Calcium: 8.4 mg/dL — ABNORMAL LOW (ref 8.9–10.3)
Chloride: 107 mmol/L (ref 98–111)
Creatinine, Ser: 2.35 mg/dL — ABNORMAL HIGH (ref 0.61–1.24)
GFR, Estimated: 29 mL/min — ABNORMAL LOW (ref >60)
Glucose, Bld: 171 mg/dL — ABNORMAL HIGH (ref 70–99)
Potassium: 4.9 mmol/L (ref 3.5–5.1)
Sodium: 135 mmol/L (ref 135–145)

## 2021-07-12 LAB — FERRITIN: Ferritin: 288 ng/mL (ref 24–336)

## 2021-07-12 MED ORDER — OXYCODONE HCL 5 MG PO TABS
ORAL_TABLET | ORAL | Status: AC
  Filled 2021-07-12: qty 1

## 2021-07-12 MED ORDER — METOPROLOL TARTRATE 25 MG PO TABS
25.0000 mg | ORAL_TABLET | Freq: Four times a day (QID) | ORAL | Status: DC
  Administered 2021-07-12 – 2021-07-14 (×7): 25 mg via ORAL
  Filled 2021-07-12 (×6): qty 1

## 2021-07-12 MED ORDER — TORSEMIDE 20 MG PO TABS
20.0000 mg | ORAL_TABLET | Freq: Every day | ORAL | Status: DC
  Administered 2021-07-13 – 2021-07-14 (×2): 20 mg via ORAL
  Filled 2021-07-12 (×2): qty 1

## 2021-07-12 MED ORDER — VITAMIN B-12 1000 MCG PO TABS
1000.0000 ug | ORAL_TABLET | Freq: Every day | ORAL | Status: DC
  Administered 2021-07-13 – 2021-07-18 (×6): 1000 ug via ORAL
  Filled 2021-07-12 (×6): qty 1

## 2021-07-12 MED ORDER — PREDNISONE 20 MG PO TABS
40.0000 mg | ORAL_TABLET | Freq: Every day | ORAL | Status: AC
  Administered 2021-07-13: 40 mg via ORAL
  Filled 2021-07-12: qty 2

## 2021-07-12 MED ORDER — METOPROLOL TARTRATE 25 MG PO TABS
25.0000 mg | ORAL_TABLET | Freq: Two times a day (BID) | ORAL | Status: DC
  Administered 2021-07-12: 25 mg via ORAL

## 2021-07-12 MED ORDER — OXYCODONE HCL 5 MG PO TABS
5.0000 mg | ORAL_TABLET | Freq: Four times a day (QID) | ORAL | Status: DC | PRN
  Administered 2021-07-12 – 2021-07-18 (×13): 5 mg via ORAL
  Filled 2021-07-12 (×12): qty 1

## 2021-07-12 MED ORDER — DILTIAZEM HCL ER COATED BEADS 180 MG PO CP24
300.0000 mg | ORAL_CAPSULE | Freq: Every day | ORAL | Status: DC
  Administered 2021-07-12 – 2021-07-18 (×7): 300 mg via ORAL
  Filled 2021-07-12 (×8): qty 1

## 2021-07-12 NOTE — ED Notes (Signed)
Patient ate 100% breakfast tray

## 2021-07-12 NOTE — ED Notes (Signed)
Patients head of bed sat up

## 2021-07-12 NOTE — ED Notes (Signed)
Nikki RN aware of assigned bed 

## 2021-07-12 NOTE — Progress Notes (Signed)
Patient ID: Matthew [name], male   DOB: 08/14/1951, 70 y.o.   MRN: [id number] [facility] PROGRESS NOTE  Matthew [name] RKY:[id number] DOB: 12-Feb-1951 DOA: 07/11/2021 PCP: [name], MD  HPI/Subjective: Patient coming in with pain in his legs, shortness of breath and atrial fibrillation with rapid ventricular rate.  Was recently discharged.  Objective: Vitals:   07/12/21 1321 07/12/21 1323  BP:  103/86  Pulse: 83 83  Resp:  20  Temp: 97.9 F (36.6 C)   SpO2:  100%    Intake/Output Summary (Last 24 hours) at 07/12/2021 1523 Last data filed at 07/12/2021 0908 Gross per 24 hour  Intake 380 ml  Output 225 ml  Net 155 ml   Filed Weights   07/11/21 1323  Weight: 127 kg    ROS: Review of Systems  Respiratory:  Positive for shortness of breath.   Cardiovascular:  Negative for chest pain.  Gastrointestinal:  Negative for abdominal pain, nausea and vomiting.  Musculoskeletal:  Positive for joint pain.  Exam: Physical Exam HENT:     Head: Normocephalic.     Mouth/Throat:     Pharynx: No oropharyngeal exudate.  Eyes:     General: Lids are normal.     Conjunctiva/sclera: Conjunctivae normal.  Cardiovascular:     Rate and Rhythm: Tachycardia present. Rhythm irregularly irregular.     Heart sounds: Normal heart sounds, S1 normal and S2 normal.  Pulmonary:     Breath sounds: No decreased breath sounds, wheezing, rhonchi or rales.  Abdominal:     Palpations: Abdomen is soft.     Tenderness: There is no abdominal tenderness.  Musculoskeletal:     Right ankle: Swelling present.     Left ankle: Swelling present.  Skin:    General: Skin is warm.     Findings: No rash.  Neurological:     Mental Status: He is alert and oriented to person, place, and time.      Scheduled Meds:  colchicine  0.3 mg Oral Daily   diltiazem  300 mg Oral Daily   DULoxetine  60 mg Oral BID   melatonin  5 mg Oral QHS   metoprolol tartrate  25 mg Oral Q6H   mirtazapine  15 mg Oral QHS    mometasone-formoterol  2 puff Inhalation BID   multivitamin with minerals  1 tablet Oral Daily   pantoprazole  40 mg Oral Daily   [START ON 07/13/2021] predniSONE  40 mg Oral Q breakfast   tamsulosin  0.4 mg Oral QPC supper   [START ON 07/13/2021] torsemide  20 mg Oral Daily   Brief history: 70 year old man with COPD, chronic atrial fibrillation on Eliquis, essential hypertension, chronic kidney disease stage IV.  He presents with pain in his feet and found to be in rapid atrial fibrillation.  He was also found to have a 4 g drop in hemoglobin over the past month and given a unit of blood.  Assessment/Plan:  Atrial fibrillation with rapid ventricular response.  Heart rate this morning still around 115.  I restarted Cardizem CD 300 mg and metoprolol 25 twice a day.  Cardiology increased metoprolol to every 6 hours.  Heart rate better controlled this afternoon.  Eliquis on hold with drop in hemoglobin. Symptomatic anemia.  Lab work-up consistent with anemia of chronic disease.  We will check LDH, haptoglobin and total bilirubin tomorrow.  Patient had a 4 g drop in hemoglobin over the last month.  Patient stated he did not want a GI  work-up.  Hold Eliquis and aspirin. Acute gouty arthritis.  Patient placed on prednisone for couple doses.  Already on colchicine Chronic kidney disease stage IV.  Today GFR 29. Vitamin B12 deficiency.  Restart oral B12 Depression.  Restart Cymbalta and Remeron.  Patient's wife states he has been off his Cymbalta for a few days. Weakness.  Physical therapy evaluation COPD.  Continue inhalers.  Patient breathing on room air when I saw him. Obesity.  BMI 35.95 listed in the computer.        Code Status:     Code Status Orders  (From admission, onward)           Start     Ordered   07/11/21 1731  Full code  Continuous        07/11/21 1732           Code Status History     Date Active Date Inactive Code Status Order ID Comments User Context    07/03/2021 1915 07/07/2021 2131 Full Code [id number]  Cecille Rubin, MD ED   06/12/2021 2231 06/23/2021 1905 Full Code [id number]  Para Skeans, MD ED   12/04/2020 0319 12/04/2020 2328 Full Code [id number]  Athena Masse, MD ED   05/18/2020 0506 05/27/2020 2244 Full Code [id number]  Rise Patience, MD Inpatient   10/15/2018 1704 10/18/2018 2131 Full Code [id number]  Loletha Grayer, MD ED      Family Communication: Updated wife on the phone Disposition Plan: Status is: Inpatient  Consultants: Cardiology  Time spent: 28 minutes  [place]

## 2021-07-12 NOTE — Consult Note (Signed)
Cardiology Consultation:   Patient ID: Matthew [name] MRN: [id number]; DOB: January 31, 1951  Admit date: 07/11/2021 Date of Consult: 07/12/2021  PCP:  [name], MD   [facility] Providers Cardiologist: New:1}    Patient Profile:   Matthew [name] is a 70 y.o. male with a hx of HTN, asthma, obesity, COPD, chronic respiratory failure on 2L O2 CKD stage 3, afib on Eliquis, OSA not on CPAP who is being seen 07/12/2021 for the evaluation of afib RVR at the request of Dr. Earleen Newport.  History of Present Illness:   Matthew [name] doesn't regularly follow with cardiology or PCP. He saw Dr. Clayborn Bigness in 2020. He did a cath that showed no significant CAD. Echo at that time showed LVEF >65%, no WMA.   Recently admitted twice for SOB treated for asthma and COPD. At the second one he was sent home with O2. During one of the hospitalizations he says he was started on Eliquis for afib RVR. Seems afib is a new dx for the patient, although might have had it for a long time.   Echo 06/13/21 showed LVEF 50-55%, no WMA, mildly dilated LA, trivial MR, aortic dilation measuring 36mm.   The patient presented to the ER 07/11/21 for pain in both feet, SOB, increased fatigue. Feels similar to prior COPD attack or asthma attack. No chest pain. He has some pedal edema. No orthopnea or pnd. He uses O2 at home for the past week. No fever, chills, nasuea, vomiting. He reports compliance with medications.   In the ER Hgb noted to 8.1 (drop of 4g) HCT 26.9. Also noted to be in afib RVR and started on IV cardizem. Labs showed sodium 137, potassium 5.0, glucose 102, Scr 2.28, BUN 45, HS trop 12, WBC 5.1,   Occasional alcohol use. Doesn't smoke. No drug use. Lives with Matthew [name]. Performs all ADLs.   Past Medical History:  Diagnosis Date   Asthma    CAD (coronary artery disease)    Edema    Hip dislocation, bilateral ([place])    Hypertension     Past Surgical History:  Procedure Laterality Date   ABDOMINAL SURGERY      CHOLECYSTECTOMY     COLOSTOMY     COLOSTOMY TAKEDOWN     HERNIA REPAIR     LEFT HEART CATH AND CORONARY ANGIOGRAPHY N/A 10/17/2018   Procedure: LEFT HEART CATH AND CORONARY ANGIOGRAPHY with possible PCI and stent;  Surgeon: Yolonda Kida, MD;  Location: [facility];  Service: Cardiovascular;  Laterality: N/A;   ORIF FEMUR FRACTURE Left 05/19/2020   Procedure: OPEN REDUCTION INTERNAL FIXATION (ORIF) DISTAL FEMUR FRACTURE;  Surgeon: Shona Needles, MD;  Location: [place];  Service: Orthopedics;  Laterality: Left;   TOTAL HIP ARTHROPLASTY Bilateral      Home Medications:  Prior to Admission medications   Medication Sig Start Date End Date Taking? Authorizing Provider  albuterol (VENTOLIN HFA) 108 (90 Base) MCG/ACT inhaler Inhale 2 puffs into the lungs every 6 (six) hours as needed for wheezing or shortness of breath.   Yes [provider]  apixaban (ELIQUIS) 5 MG TABS tablet Take 1 tablet (5 mg total) by mouth 2 (two) times daily. 07/07/21 10/05/21 Yes Enzo Bi, MD  diltiazem (CARDIZEM CD) 300 MG 24 hr capsule Take 1 capsule (300 mg total) by mouth daily. 07/07/21 10/05/21 Yes Enzo Bi, MD  DULoxetine (CYMBALTA) 60 MG capsule Take 1 capsule (60 mg total) by mouth 2 (two) times daily. 06/15/21  Yes Masoud, Viann Shove, MD  melatonin 5 MG TABS Take 1 tablet (5 mg total) by mouth at bedtime. 06/23/21  Yes Richarda Osmond, MD  metoprolol tartrate (LOPRESSOR) 25 MG tablet Take 1 tablet (25 mg total) by mouth 2 (two) times daily. 07/07/21 10/05/21 Yes Enzo Bi, MD  mirtazapine (REMERON) 15 MG tablet Take 15 mg by mouth at bedtime.   Yes [provider]  mometasone-formoterol (DULERA) 200-5 MCG/ACT AERO Inhale 2 puffs into the lungs 2 (two) times daily. 07/07/21  Yes Enzo Bi, MD  Multiple Vitamin (MULTIVITAMIN WITH MINERALS) TABS tablet Take 1 tablet by mouth daily. 07/07/21  Yes Enzo Bi, MD  tamsulosin (FLOMAX) 0.4 MG CAPS capsule Take 1 capsule (0.4 mg total) by mouth  daily after supper. 07/07/21 10/05/21 Yes Enzo Bi, MD  allopurinol (ZYLOPRIM) 100 MG tablet Take 1 tablet (100 mg total) by mouth as needed (gout). Patient taking differently: Take 100 mg by mouth daily. 06/23/21   Richarda Osmond, MD  aspirin EC 81 MG EC tablet Take 1 tablet (81 mg total) by mouth daily. Swallow whole. 06/24/21   Richarda Osmond, MD  bisacodyl (DULCOLAX) 5 MG EC tablet Take 1 tablet (5 mg total) by mouth daily as needed for moderate constipation. 06/23/21   Richarda Osmond, MD  colchicine 0.6 MG tablet Take 0.5 tablets (0.3 mg total) by mouth daily. 06/24/21   Richarda Osmond, MD  ipratropium-albuterol (DUONEB) 0.5-2.5 (3) MG/3ML SOLN Take 3 mLs by nebulization every 6 (six) hours as needed. 07/07/21   Enzo Bi, MD  polyethylene glycol (MIRALAX / GLYCOLAX) 17 g packet Take 17 g by mouth daily as needed for mild constipation. 06/23/21   Richarda Osmond, MD    Inpatient Medications: Scheduled Meds:  colchicine  0.3 mg Oral Daily   diltiazem  300 mg Oral Daily   DULoxetine  60 mg Oral BID   melatonin  5 mg Oral QHS   metoprolol tartrate  25 mg Oral BID   mirtazapine  15 mg Oral QHS   mometasone-formoterol  2 puff Inhalation BID   multivitamin with minerals  1 tablet Oral Daily   pantoprazole  40 mg Oral Daily   predniSONE  60 mg Oral Q breakfast   tamsulosin  0.4 mg Oral QPC supper   Continuous Infusions:  PRN Meds: acetaminophen, albuterol, allopurinol, bisacodyl, ipratropium-albuterol, ondansetron (ZOFRAN) IV, polyethylene glycol  Allergies:    Allergies  Allergen Reactions   Demerol [Meperidine Hcl]    Lisinopril Other (See Comments)    Hypotensive     Social History:   Social History   Socioeconomic History   Marital status: Married    Spouse name: Not on file   Number of children: Not on file   Years of education: Not on file   Highest education level: Not on file  Occupational History   Not on file  Tobacco Use   Smoking  status: Never   Smokeless tobacco: Never  Vaping Use   Vaping Use: Never used  Substance and Sexual Activity   Alcohol use: Not Currently   Drug use: Not on file   Sexual activity: Not on file  Other Topics Concern   Not on file  Social History Narrative   Not on file   Social Determinants of Health   Financial Resource Strain: Not on file  Food Insecurity: Not on file  Transportation Needs: Not on file  Physical Activity: Not on file  Stress: Not on file  Social  Connections: Not on file  Intimate Partner Violence: Not on file    Family History:    Family History  Problem Relation Age of Onset   COPD Mother    Cancer Mother    Melanoma Father      ROS:  Please see the history of present illness.   All other ROS reviewed and negative.     Physical Exam/Data:   Vitals:   07/12/21 0230 07/12/21 0315 07/12/21 0521 07/12/21 0600  BP: 106/90 102/70 (!) 118/94 125/84  Pulse: 90 85 (!) 111 89  Resp: (!) 23 19  20   Temp:      TempSrc:      SpO2: 100% 100%  100%  Weight:      Height:        Intake/Output Summary (Last 24 hours) at 07/12/2021 0741 Last data filed at 07/12/2021 0115 Gross per 24 hour  Intake 380 ml  Output --  Net 380 ml   Last 3 Weights 07/11/2021 07/06/2021 07/05/2021  Weight (lbs) 280 lb 295 lb 3.1 oz 298 lb 11.6 oz  Weight (kg) 127.007 kg 133.9 kg 135.5 kg     Body mass index is 35.95 kg/m.  General:  Well nourished, well developed, in no acute distress HEENT: normal Neck: no JVD Vascular: No carotid bruits; Distal pulses 2+ bilaterally Cardiac:  normal S1, S2; Irreg IRreg; no murmur  Lungs:  clear to auscultation bilaterally, no wheezing, rhonchi or rales  Abd: soft, nontender, no hepatomegaly  Ext: lower leg edema Musculoskeletal:  No deformities, BUE and BLE strength normal and equal Skin: warm and dry  Neuro:  CNs 2-12 intact, no focal abnormalities noted Psych:  Normal affect   EKG:  The EKG was personally reviewed and  demonstrates:  Afib RVR, 146bpm, nonspecific ST/T wave changes Telemetry:  Telemetry was personally reviewed and demonstrates:  Afib, HR 100-140s, PVCs  Relevant CV Studies:  Echo 06/13/21  1. AF with RVR precludes accurate EF assessment. Left ventricular  ejection fraction, by estimation, is 50 to 55%. The left ventricle has low  normal function. The left ventricle has no regional wall motion  abnormalities. Left ventricular diastolic  parameters are indeterminate.   2. Right ventricular systolic function is normal. The right ventricular  size is normal.   3. Left atrial size was mildly dilated.   4. The mitral valve is normal in structure. Trivial mitral valve  regurgitation.   5. The aortic valve is normal in structure. Aortic valve regurgitation is  not visualized. No aortic stenosis is present.   6. Aortic dilatation noted. There is mild dilatation of the aortic root,  measuring 41 mm.   LHC 10/17/2018 Conclusion    Normal left ventricular function Normal coronaries   Conclusion Normal cardiac cath via right radial No evidence coronary disease Troponin represents demand ischemia not non-STEMI  Laboratory Data:  High Sensitivity Troponin:   Recent Labs  Lab 06/12/21 2124 07/03/21 1541 07/04/21 0005 07/11/21 1325 07/11/21 1526  TROPONINIHS 21* 14 11 12 11      Chemistry Recent Labs  Lab 07/07/21 0520 07/11/21 1325 07/12/21 0526  NA 135 137 135  K 4.4 5.0 4.9  CL 105 108 107  CO2 25 23 21*  GLUCOSE 95 102* 171*  BUN 63* 45* 45*  CREATININE 2.37* 2.28* 2.35*  CALCIUM 7.8* 8.5* 8.4*  MG 1.9  --   --   GFRNONAA 29* 30* 29*  ANIONGAP 5 6 7     No results for input(s): PROT,  ALBUMIN, AST, ALT, ALKPHOS, BILITOT in the last 168 hours. Lipids No results for input(s): CHOL, TRIG, HDL, LABVLDL, LDLCALC, CHOLHDL in the last 168 hours.  Hematology Recent Labs  Lab 07/07/21 0520 07/11/21 1325 07/12/21 0526  WBC 5.5 5.1 3.7*  RBC 2.54* 2.53* 2.81*  HGB 7.9*  8.1* 8.8*  HCT 26.7* 26.9* 28.9*  MCV 105.1* 106.3* 102.8*  MCH 31.1 32.0 31.3  MCHC 29.6* 30.1 30.4  RDW 14.0 14.6 15.3  PLT 157 240 247   Thyroid No results for input(s): TSH, FREET4 in the last 168 hours.  BNP Recent Labs  Lab 07/11/21 1503  BNP 95.6    DDimer No results for input(s): DDIMER in the last 168 hours.   Radiology/Studies:  DG Chest 2 View  Result Date: 07/11/2021 CLINICAL DATA:  SOB EXAM: CHEST - 2 VIEW COMPARISON:  07/03/2021 FINDINGS: Stable cardiomediastinal contours. No new consolidation or edema. No pleural effusion or pneumothorax. The visualized skeletal structures are unremarkable. IMPRESSION: No acute process in the chest. Electronically Signed   By: Macy Mis M.D.   On: 07/11/2021 14:23     Assessment and Plan:   Acute anemia - Hgb drop of 4, Eliquis held - pt denies overt bleeding - s/p 1 unit PRBC - consult GI for possible EGD/colonoscopy - check FOBT - restart Eliquis once etiology on anemia resolved/Hgb stable  Afib RVR - afib RVR in the setting of acute anemia. Initially started on IV cardizem, held for hypotension, now on PTA diltiazem 300mg  daily and Lopressor 25mg  BID - Unclear chronicity of afib. Patient reports its a fairly new dx and was recently started on Eliquis and rate controlling medications.  - Echo 05/2021 showed LVEF 50-55% - continue PTA diltiazem 300mg  daily and Lopressor - continue PTA Eliquis 5mg  BID - rates around 100 bpm, may need to increase BB for better rate control - TSH normal 10/22 - keep Mag>2 and K>4 - suspect rates will improve with treatment of anemia - can consider DCCV as outpatient if still in afib - needs to use CPAP - regular follow-up encouraged  HFmrEF - Most recent echo with LVEF 50-55% - some lower leg edema on exam - BNP normal - continue Lopressor - may need small does lasix vs lifestyle changes  OSA - CPAP use encouraged  CKD stage 3 - Scr 2.35, BUN 45, which is around  baseline - needs to establish with nephrology  Acute on chronic respiratory failure COPD - requiring 4L O2 - CXR negative - tx per IM  For questions or updates, please contact [facility] Please consult [email or web address] for contact info under    Signed, Lydiah Pong Ninfa Meeker, PA-C  07/12/2021 7:41 AM

## 2021-07-13 ENCOUNTER — Ambulatory Visit: Payer: Medicare HMO | Admitting: Family

## 2021-07-13 DIAGNOSIS — I4891 Unspecified atrial fibrillation: Secondary | ICD-10-CM | POA: Diagnosis not present

## 2021-07-13 DIAGNOSIS — I5030 Unspecified diastolic (congestive) heart failure: Secondary | ICD-10-CM | POA: Diagnosis not present

## 2021-07-13 LAB — LACTATE DEHYDROGENASE: LDH: 159 U/L (ref 98–192)

## 2021-07-13 LAB — BASIC METABOLIC PANEL
Anion gap: 5 (ref 5–15)
BUN: 52 mg/dL — ABNORMAL HIGH (ref 8–23)
CO2: 23 mmol/L (ref 22–32)
Calcium: 8.6 mg/dL — ABNORMAL LOW (ref 8.9–10.3)
Chloride: 108 mmol/L (ref 98–111)
Creatinine, Ser: 2.63 mg/dL — ABNORMAL HIGH (ref 0.61–1.24)
GFR, Estimated: 25 mL/min — ABNORMAL LOW (ref >60)
Glucose, Bld: 164 mg/dL — ABNORMAL HIGH (ref 70–99)
Potassium: 5.4 mmol/L — ABNORMAL HIGH (ref 3.5–5.1)
Sodium: 136 mmol/L (ref 135–145)

## 2021-07-13 LAB — CBC
HCT: 27.6 % — ABNORMAL LOW (ref 39.0–52.0)
Hemoglobin: 8.4 g/dL — ABNORMAL LOW (ref 13.0–17.0)
MCH: 31.2 pg (ref 26.0–34.0)
MCHC: 30.4 g/dL (ref 30.0–36.0)
MCV: 102.6 fL — ABNORMAL HIGH (ref 80.0–100.0)
Platelets: 253 10*3/uL (ref 150–400)
RBC: 2.69 MIL/uL — ABNORMAL LOW (ref 4.22–5.81)
RDW: 14.8 % (ref 11.5–15.5)
WBC: 5.7 10*3/uL (ref 4.0–10.5)
nRBC: 0 % (ref 0.0–0.2)

## 2021-07-13 NOTE — Plan of Care (Signed)

## 2021-07-13 NOTE — Evaluation (Addendum)
Physical Therapy Evaluation Patient Details Name: Matthew [name] MRN: [id number] DOB: 1951-05-30 Today's Date: 07/13/2021  History of Present Illness  Patient is a 70 year old male who presents with medical history significant for COPD with chronic respiratory failure on 2 L of oxygen, history of atrial fibrillation on chronic anticoagulation therapy, hypertension with stage III chronic kidney disease, obesity and asthma   Clinical Impression  Patient tolerated session fairly and was agreeable to treatment. MD cleared patient for participation despite high Potassium prior to session. Upon entering room patient was in bed w/ HOB elevated. No pain or chest pain reported throughout session. Increased respiration rate with bed mobility, and SAQs noted. HR ranged from 89-108bpm throughout session, with the highest HR following SAQs at EOB. Bed mobility patient is able to complete with supervision and UE support from bed hand rails. Patient was unable to complete sit to stand safely Max x1 following 5 attempts. Ambulation was unable to be performed due to unable to safely perform Max A x1 safely. Recommend SNF at this time. Patient would benefit from continued skilled physical therapy in order to improve BLE strength, transfers, and ambulation to return patient to PLOF.    Recommendations for follow up therapy are one component of a multi-disciplinary discharge planning process, led by the attending physician.  Recommendations may be updated based on patient status, additional functional criteria and insurance authorization.  Follow Up Recommendations Skilled nursing-short term rehab (<3 hours/day)    Assistance Recommended at Discharge Frequent or constant Supervision/Assistance  Functional Status Assessment Patient has had a recent decline in their functional status and demonstrates the ability to make significant improvements in function in a reasonable and predictable amount of time.  Equipment  Recommendations  None recommended by PT    Recommendations for Other Services       Precautions / Restrictions Precautions Precautions: Fall Restrictions Weight Bearing Restrictions: No Other Position/Activity Restrictions: watch HR      Mobility  Bed Mobility Overal bed mobility: Needs Assistance Bed Mobility: Supine to Sit     Supine to sit: Supervision Sit to supine: Supervision   General bed mobility comments: Requires increased time and effort, and BUE support from bed rails    Transfers Overall transfer level: Needs assistance Equipment used: Rolling walker (2 wheels) Transfers: Sit to/from Stand Sit to Stand: Max assist (unable to stand Max A x1 following x5 attemps and cueing to rock 3 times to gain momentum to stand.)   Step pivot transfers:  (unable to safely perform with one person)      Lateral/Scoot Transfers: Supervision General transfer comment: increased time and effort    Ambulation/Gait Ambulation/Gait assistance:  (unable to safely perform with one person)                Financial trader Rankin (Stroke Patients Only)       Balance Overall balance assessment: Needs assistance Sitting-balance support: Feet supported;Bilateral upper extremity supported Sitting balance-Leahy Scale: Fair Sitting balance - Comments: fair sitting balance, able to reach within     Standing balance-Leahy Scale: Zero Standing balance comment: unable to complete safely x1 person                             Pertinent Vitals/Pain Pain Assessment: No/denies pain Pain Intervention(s): Limited activity within patient's tolerance;Monitored during session    Home  Living Family/patient expects to be discharged to:: Private residence Living Arrangements: Spouse/significant other Available Help at Discharge: Family Type of Home: Mobile home Home Access: Stairs to enter Entrance Stairs-Rails: Right;Left;Can  reach both Entrance Stairs-Number of Steps: [place]: One level [place]: Conservation officer, nature (2 wheels);Cane - single point;BSC/3in1;Other (comment) (Patient reports having an O2 tank at home however does not use) Additional Comments: Typically uses RW, specifically when out in the community    Prior Function Prior Level of Function : Independent/Modified Independent               ADLs Comments: Reports IND with ADLs; wife handles cooking, cleaning, driving     Hand Dominance   Dominant Hand: Right    Extremity/Trunk Assessment   Upper Extremity Assessment Upper Extremity Assessment: Defer to OT evaluation    Lower Extremity Assessment Lower Extremity Assessment: Generalized weakness (Globally 3-3+/5 BLE)       Communication   Communication: No difficulties  Cognition Arousal/Alertness: Awake/alert Behavior During Therapy: WFL for tasks assessed/performed Overall Cognitive Status: Within Functional Limits for tasks assessed                                 General Comments: Oriented to self, time, place, & situation        General Comments      Exercises Total Joint Exercises Short Arc Quad: Seated;AROM;Both;10 reps   Assessment/Plan    PT Assessment Patient needs continued PT services  PT Problem List Decreased strength;Decreased balance;Decreased activity tolerance;Decreased mobility;Cardiopulmonary status limiting activity       PT Treatment Interventions Gait training;DME instruction;Therapeutic activities;Therapeutic exercise;Balance training;Patient/family education;Neuromuscular re-education;Stair training    PT Goals (Current goals can be found in the Care Plan section)  Acute Rehab PT Goals Patient Stated Goal: to get stronger and go home PT Goal Formulation: With patient Time For Goal Achievement: 07/27/21 Potential to Achieve Goals: Good    Frequency Min 2X/week   Barriers to discharge        Co-evaluation                AM-PAC PT "6 Clicks" Mobility  Outcome Measure Help needed turning from your back to your side while in a flat bed without using bedrails?: A Little Help needed moving from lying on your back to sitting on the side of a flat bed without using bedrails?: A Little Help needed moving to and from a bed to a chair (including a wheelchair)?: A Lot Help needed standing up from a chair using your arms (e.g., wheelchair or bedside chair)?: A Lot Help needed to walk in hospital room?: A Lot Help needed climbing 3-5 steps with a railing? : A Lot 6 Click Score: 14    End of Session Equipment Utilized During Treatment: Gait belt Activity Tolerance: Patient limited by fatigue Patient left: in bed;with call bell/phone within reach;with bed alarm set Nurse Communication: Mobility status;Precautions PT Visit Diagnosis: Muscle weakness (generalized) (M62.81);History of falling (Z91.81);Difficulty in walking, not elsewhere classified (R26.2);Repeated falls (R29.6);Unsteadiness on feet (R26.81)    Time: 7616-0737 PT Time Calculation (min) (ACUTE ONLY): 30 min   Charges:   PT Evaluation $PT Eval Low Complexity: 1 Low PT Treatments $Therapeutic Activity: 8-22 mins        Iva Boop, PT  07/13/21. 10:51 AM

## 2021-07-13 NOTE — Progress Notes (Signed)
Progress Note  Patient Name: Matthew [name] Date of Encounter: 07/13/2021  [facility] Cardiologist: New to Dr. Saunders Revel  Subjective   Hgb stable at 8.4. Scr/BUN mildly up. On my interview patient says he is willing to undergo GI work-up. He reports some shoulder pain. No SOB.   Inpatient Medications    Scheduled Meds:  colchicine  0.3 mg Oral Daily   diltiazem  300 mg Oral Daily   DULoxetine  60 mg Oral BID   melatonin  5 mg Oral QHS   metoprolol tartrate  25 mg Oral Q6H   mirtazapine  15 mg Oral QHS   mometasone-formoterol  2 puff Inhalation BID   multivitamin with minerals  1 tablet Oral Daily   pantoprazole  40 mg Oral Daily   tamsulosin  0.4 mg Oral QPC supper   torsemide  20 mg Oral Daily   vitamin B-12  1,000 mcg Oral Daily   Continuous Infusions:  PRN Meds: acetaminophen, albuterol, allopurinol, bisacodyl, ipratropium-albuterol, ondansetron (ZOFRAN) IV, oxyCODONE, polyethylene glycol   Vital Signs    Vitals:   07/13/21 0001 07/13/21 0029 07/13/21 0335 07/13/21 0715  BP: 137/71  117/78 110/77  Pulse: (!) 106  91 87  Resp: 20  16 18   Temp: (!) 97.4 F (36.3 C) 97.6 F (36.4 C) 97.6 F (36.4 C) 97.6 F (36.4 C)  TempSrc: Oral  Oral   SpO2: 100%  100% 99%  Weight:      Height:        Intake/Output Summary (Last 24 hours) at 07/13/2021 1128 Last data filed at 07/13/2021 1104 Gross per 24 hour  Intake 360 ml  Output 800 ml  Net -440 ml   Last 3 Weights 07/11/2021 07/06/2021 07/05/2021  Weight (lbs) 280 lb 295 lb 3.1 oz 298 lb 11.6 oz  Weight (kg) 127.007 kg 133.9 kg 135.5 kg      Telemetry    Afib HR 90-100, PVCs - Personally Reviewed  ECG    No new - Personally Reviewed  Physical Exam   GEN: No acute distress.   Neck: No JVD Cardiac: Irreg Irreg, no murmurs, rubs, or gallops.  Respiratory: Clear to auscultation bilaterally. GI: Soft, nontender, non-distended  MS: + lower leg edema; No deformity. Neuro:  Nonfocal  Psych: Normal affect    Labs    High Sensitivity Troponin:   Recent Labs  Lab 07/03/21 1541 07/04/21 0005 07/11/21 1325 07/11/21 1526  TROPONINIHS 14 11 12 11      Chemistry Recent Labs  Lab 07/07/21 0520 07/11/21 1325 07/12/21 0526 07/12/21 1520 07/13/21 0545  NA 135 137 135  --  136  K 4.4 5.0 4.9  --  5.4*  CL 105 108 107  --  108  CO2 25 23 21*  --  23  GLUCOSE 95 102* 171*  --  164*  BUN 63* 45* 45*  --  52*  CREATININE 2.37* 2.28* 2.35*  --  2.63*  CALCIUM 7.8* 8.5* 8.4*  --  8.6*  MG 1.9  --   --   --   --   PROT  --   --   --  5.6*  --   ALBUMIN  --   --   --  2.2*  --   AST  --   --   --  18  --   ALT  --   --   --  26  --   ALKPHOS  --   --   --  55  --   BILITOT  --   --   --  0.7  --   GFRNONAA 29* 30* 29*  --  25*  ANIONGAP 5 6 7   --  5    Lipids No results for input(s): CHOL, TRIG, HDL, LABVLDL, LDLCALC, CHOLHDL in the last 168 hours.  Hematology Recent Labs  Lab 07/11/21 1325 07/12/21 0526 07/13/21 0545  WBC 5.1 3.7* 5.7  RBC 2.53* 2.81* 2.69*  HGB 8.1* 8.8* 8.4*  HCT 26.9* 28.9* 27.6*  MCV 106.3* 102.8* 102.6*  MCH 32.0 31.3 31.2  MCHC 30.1 30.4 30.4  RDW 14.6 15.3 14.8  PLT 240 247 253   Thyroid No results for input(s): TSH, FREET4 in the last 168 hours.  BNP Recent Labs  Lab 07/11/21 1503  BNP 95.6    DDimer No results for input(s): DDIMER in the last 168 hours.   Radiology    DG Chest 2 View  Result Date: 07/11/2021 CLINICAL DATA:  SOB EXAM: CHEST - 2 VIEW COMPARISON:  07/03/2021 FINDINGS: Stable cardiomediastinal contours. No new consolidation or edema. No pleural effusion or pneumothorax. The visualized skeletal structures are unremarkable. IMPRESSION: No acute process in the chest. Electronically Signed   By: Macy Mis M.D.   On: 07/11/2021 14:23    Cardiac Studies   Echo 06/13/21  1. AF with RVR precludes accurate EF assessment. Left ventricular  ejection fraction, by estimation, is 50 to 55%. The left ventricle has low  normal  function. The left ventricle has no regional wall motion  abnormalities. Left ventricular diastolic  parameters are indeterminate.   2. Right ventricular systolic function is normal. The right ventricular  size is normal.   3. Left atrial size was mildly dilated.   4. The mitral valve is normal in structure. Trivial mitral valve  regurgitation.   5. The aortic valve is normal in structure. Aortic valve regurgitation is  not visualized. No aortic stenosis is present.   6. Aortic dilatation noted. There is mild dilatation of the aortic root,  measuring 41 mm.    LHC 10/17/2018 Conclusion     Normal left ventricular function Normal coronaries   Conclusion Normal cardiac cath via right radial No evidence coronary disease Troponin represents demand ischemia not non-STEMI  Patient Profile     70 y.o. male  with a hx of HTN, asthma, obesity, COPD, chronic respiratory failure on 2L O2 CKD stage 3, afib on Eliquis, OSA not on CPAP who is being seen 07/12/2021 for the evaluation of afib RVR.  Assessment & Plan    Acute anemia - Hgb drop of 4, Eliquis held and ASA held - pt denies overt bleeding. Initially not agreeable to GI work-up, now says he is willing to pursue - s/p 1 unit PRBC - Hgb today 8.4 - check FOBT - restart Eliquis once etiology on anemia resolved/Hgb stable   Afib RVR - afib RVR in the setting of acute anemia. Initially started on IV cardizem, held for hypotension, now on PTA diltiazem 300mg  daily and Lopressor 25mg  BID - Unclear chronicity of afib. Patient reports its a fairly new dx and was recently started on Eliquis and rate controlling medications.  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

## 2021-07-13 NOTE — Progress Notes (Signed)
PROGRESS NOTE    Matthew [name]   ZHY:[id number]  DOB: 04-Jan-1951  PCP: [name], MD    DOA: 07/11/2021 LOS: 2    Brief Narrative / Hospital Course to Date:   70 year old man with COPD, chronic atrial fibrillation on Eliquis, essential hypertension, chronic kidney disease stage IV.  He presents with pain in his feet and found to be in rapid atrial fibrillation, with acute anemia having a 4 g drop in hemoglobin over the past month and given a unit of blood.  Assessment & Plan   Principal Problem:   Atrial fibrillation with rapid ventricular response (HCC) Active Problems:   Stage 3b chronic kidney disease (HCC)   Obesity (BMI 30-39.9)   Acute respiratory failure (HCC)   Acute gouty arthritis   Anemia of chronic disease   Heart failure with preserved ejection fraction (HCC)   Atrial fibrillation with RVR - HR now controlled.   --Cardiology following, see their recs --Continue Cardizem CD 300 mg  --Continue metoprolol 25 QID per cards.  --Eliquis on hold due to anemia  Symptomatic anemia - POA with 4 pt Hbg drop in 1 month's time.  Pt denies bloody, melanotic or dark/tarry stools.  Labs reflect anemia of chronic disease. LDH and Tbili normal.   --Pt decline GI evaluation at this time, agrees to check fecal occult for blood. --FOBT pending --Haptoglobin pending --Hold ASA and Eliquis  Acute gouty arthritis - Continue prednisone for couple doses. Also on colchicine.  Chronic kidney disease stage IV - Renal function fairly stable. --Monitor BMP  Vitamin B12 deficiency - Continue oral B12  Depression - resumed on Cymbalta and Remeron.   His wife reported pt had been off his Cymbalta for a few days.  Generalized Weakness - PT evaluation  COPD - stable, no wheezing or signs of exacerbation. --Continue bronchodilators   Obesity: Body mass index is 35.95 kg/m.  Complicates overall care and prognosis.  Recommend lifestyle modifications including physical activity  and diet for weight loss and overall long-term health.   DVT prophylaxis: SCDs Start: 07/11/21 1732   Diet:  Diet Orders (From admission, onward)     Start     Ordered   07/11/21 1731  Diet 2 gram sodium Room service appropriate? Yes; Fluid consistency: Thin  Diet effective now       Question Answer Comment  Room service appropriate? Yes   Fluid consistency: Thin      07/11/21 1732              Code Status: Full Code   Subjective 07/13/21    Pt reports still having pain in tops of both feet.  No SOB, CP or palpitations.  Still says wants to avoid endoscopies but might consider.  Agreeable to check stool for occult blood.  No other acute complaints.    Disposition Plan & Communication   Status is: Inpatient  Remains inpatient appropriate because: Ongoing evaluation of anemia.     Consults, Procedures, Significant Events   Consultants:  Cardiology  Procedures:  None  Antimicrobials:  Anti-infectives (From admission, onward)    None         Micro    Objective   Vitals:   07/13/21 0335 07/13/21 0715 07/13/21 1304 07/13/21 1625  BP: 117/78 110/77  125/71  Pulse: 91 87  78  Resp: 16 18  16   Temp: 97.6 F (36.4 C) 97.6 F (36.4 C)  97.6 F (36.4 C)  TempSrc: Oral   Oral  SpO2:  100% 99% 99% 98%  Weight:      Height:        Intake/Output Summary (Last 24 hours) at 07/13/2021 1906 Last data filed at 07/13/2021 1626 Gross per 24 hour  Intake 480 ml  Output 1675 ml  Net -1195 ml   Filed Weights   07/11/21 1323  Weight: 127 kg    Physical Exam:  General exam: awake, alert, no acute distress, obese HEENT: atraumatic, clear conjunctiva, anicteric sclera, moist mucus membranes, hearing grossly normal  Respiratory system: CTAB but diminished, no wheezes, rales or rhonchi, normal respiratory effort, on 2 L/min O2. Cardiovascular system: normal S1/S2, RRR, b/l LE pitting edema.   Gastrointestinal system: soft, non-tender abdomen Central  nervous system: A&O x3. no gross focal neurologic deficits, normal speech Skin: dry, intact, normal temperature Psychiatry: normal mood, congruent affect, judgement and insight appear normal  Labs   Data Reviewed: I have personally reviewed following labs and imaging studies  CBC: Recent Labs  Lab 07/07/21 0520 07/11/21 1325 07/12/21 0526 07/13/21 0545  WBC 5.5 5.1 3.7* 5.7  NEUTROABS  --  3.0  --   --   HGB 7.9* 8.1* 8.8* 8.4*  HCT 26.7* 26.9* 28.9* 27.6*  MCV 105.1* 106.3* 102.8* 102.6*  PLT 157 240 247 387   Basic Metabolic Panel: Recent Labs  Lab 07/07/21 0520 07/11/21 1325 07/12/21 0526 07/13/21 0545  NA 135 137 135 136  K 4.4 5.0 4.9 5.4*  CL 105 108 107 108  CO2 25 23 21* 23  GLUCOSE 95 102* 171* 164*  BUN 63* 45* 45* 52*  CREATININE 2.37* 2.28* 2.35* 2.63*  CALCIUM 7.8* 8.5* 8.4* 8.6*  MG 1.9  --   --   --    GFR: Estimated Creatinine Clearance: 36.5 mL/min (A) (by C-G formula based on SCr of 2.63 mg/dL (H)). Liver Function Tests: Recent Labs  Lab 07/12/21 1520  AST 18  ALT 26  ALKPHOS 55  BILITOT 0.7  PROT 5.6*  ALBUMIN 2.2*   No results for input(s): LIPASE, AMYLASE in the last 168 hours. No results for input(s): AMMONIA in the last 168 hours. Coagulation Profile: No results for input(s): INR, PROTIME in the last 168 hours. Cardiac Enzymes: No results for input(s): CKTOTAL, CKMB, CKMBINDEX, TROPONINI in the last 168 hours. BNP (last 3 results) No results for input(s): PROBNP in the last 8760 hours. HbA1C: No results for input(s): HGBA1C in the last 72 hours. CBG: No results for input(s): GLUCAP in the last 168 hours. Lipid Profile: No results for input(s): CHOL, HDL, LDLCALC, TRIG, CHOLHDL, LDLDIRECT in the last 72 hours. Thyroid Function Tests: No results for input(s): TSH, T4TOTAL, FREET4, T3FREE, THYROIDAB in the last 72 hours. Anemia Panel: Recent Labs    07/12/21 0526  FERRITIN 288   Sepsis Labs: No results for input(s):  PROCALCITON, LATICACIDVEN in the last 168 hours.  Recent Results (from the past 240 hour(s))  Resp Panel by RT-PCR (Flu A&B, Covid) Nasopharyngeal Swab     Status: None   Collection Time: 07/11/21  6:45 PM   Specimen: Nasopharyngeal Swab; Nasopharyngeal(NP) swabs in vial transport medium  Result Value Ref Range Status   SARS Coronavirus 2 by RT PCR NEGATIVE NEGATIVE Final    Comment: (NOTE) SARS-CoV-2 target nucleic acids are NOT DETECTED.  The SARS-CoV-2 RNA is generally detectable in upper respiratory specimens during the acute phase of infection. The lowest concentration of SARS-CoV-2 viral copies this assay can detect is 138 copies/mL. A negative result does not  preclude SARS-Cov-2 infection and should not be used as the sole basis for treatment or other patient management decisions. A negative result may occur with  improper specimen collection/handling, submission of specimen other than nasopharyngeal swab, presence of viral mutation(s) within the areas targeted by this assay, and inadequate number of viral copies(<138 copies/mL). A negative result must be combined with clinical observations, patient history, and epidemiological information. The expected result is Negative.  Fact Sheet for Patients:  EntrepreneurPulse.com.au  Fact Sheet for Healthcare Providers:  IncredibleEmployment.be  This test is no t yet approved or cleared by the [place] FDA and  has been authorized for detection and/or diagnosis of SARS-CoV-2 by FDA under an Emergency Use Authorization (EUA). This EUA will remain  in effect (meaning this test can be used) for the duration of the COVID-19 declaration under Section 564(b)(1) of the Act, 21 U.S.C.section 360bbb-3(b)(1), unless the authorization is terminated  or revoked sooner.       Influenza A by PCR NEGATIVE NEGATIVE Final   Influenza B by PCR NEGATIVE NEGATIVE Final    Comment: (NOTE) The Xpert Xpress  SARS-CoV-2/FLU/RSV plus assay is intended as an aid in the diagnosis of influenza from Nasopharyngeal swab specimens and should not be used as a sole basis for treatment. Nasal washings and aspirates are unacceptable for Xpert Xpress SARS-CoV-2/FLU/RSV testing.  Fact Sheet for Patients: EntrepreneurPulse.com.au  Fact Sheet for Healthcare Providers: IncredibleEmployment.be  This test is not yet approved or cleared by the [place] FDA and has been authorized for detection and/or diagnosis of SARS-CoV-2 by FDA under an Emergency Use Authorization (EUA). This EUA will remain in effect (meaning this test can be used) for the duration of the COVID-19 declaration under Section 564(b)(1) of the Act, 21 U.S.C. section 360bbb-3(b)(1), unless the authorization is terminated or revoked.  Performed at [facility], [address] [place], [place] [address]       Imaging Studies   No results found.   Medications   Scheduled Meds:  colchicine  0.3 mg Oral Daily   diltiazem  300 mg Oral Daily   DULoxetine  60 mg Oral BID   melatonin  5 mg Oral QHS   metoprolol tartrate  25 mg Oral Q6H   mirtazapine  15 mg Oral QHS   mometasone-formoterol  2 puff Inhalation BID   multivitamin with minerals  1 tablet Oral Daily   pantoprazole  40 mg Oral Daily   tamsulosin  0.4 mg Oral QPC supper   torsemide  20 mg Oral Daily   vitamin B-12  1,000 mcg Oral Daily   Continuous Infusions:     LOS: 2 days    Time spent: 30 minutes    Ezekiel Slocumb, DO [facility]  07/13/2021, 7:06 PM      If 7PM-7AM, please contact night-coverage. How to contact the [facility] Attending or Consulting provider [place] or covering provider during after hours [place], for this patient?    Check the care team in [facility] and look for a) attending/consulting [facility] provider listed and b) the [facility] team listed Log into [email or web address] and use [place]'s universal  password to access. If you do not have the password, please contact the hospital operator. Locate the [facility] provider you are looking for under [facility] and page to a number that you can be directly reached. If you still have difficulty reaching the provider, please page the [facility] (Director on Call) for the Hospitalists listed on amion for assistance.

## 2021-07-14 DIAGNOSIS — J9601 Acute respiratory failure with hypoxia: Secondary | ICD-10-CM | POA: Diagnosis not present

## 2021-07-14 DIAGNOSIS — J9602 Acute respiratory failure with hypercapnia: Secondary | ICD-10-CM

## 2021-07-14 DIAGNOSIS — D638 Anemia in other chronic diseases classified elsewhere: Secondary | ICD-10-CM | POA: Diagnosis not present

## 2021-07-14 DIAGNOSIS — N1832 Chronic kidney disease, stage 3b: Secondary | ICD-10-CM

## 2021-07-14 DIAGNOSIS — I4891 Unspecified atrial fibrillation: Secondary | ICD-10-CM | POA: Diagnosis not present

## 2021-07-14 LAB — BPAM RBC
Blood Product Expiration Date: 202212272359
Blood Product Expiration Date: 202212282359
Blood Product Expiration Date: 202212292359
ISSUE DATE / TIME: 202211282207
Unit Type and Rh: 5100
Unit Type and Rh: 5100
Unit Type and Rh: 5100

## 2021-07-14 LAB — TYPE AND SCREEN
ABO/RH(D): O POS
Antibody Screen: POSITIVE
Donor AG Type: NEGATIVE
Unit division: 0
Unit division: 0
Unit division: 0

## 2021-07-14 LAB — CBC
HCT: 30.2 % — ABNORMAL LOW (ref 39.0–52.0)
Hemoglobin: 9 g/dL — ABNORMAL LOW (ref 13.0–17.0)
MCH: 31.1 pg (ref 26.0–34.0)
MCHC: 29.8 g/dL — ABNORMAL LOW (ref 30.0–36.0)
MCV: 104.5 fL — ABNORMAL HIGH (ref 80.0–100.0)
Platelets: 276 10*3/uL (ref 150–400)
RBC: 2.89 MIL/uL — ABNORMAL LOW (ref 4.22–5.81)
RDW: 14.6 % (ref 11.5–15.5)
WBC: 7 10*3/uL (ref 4.0–10.5)
nRBC: 0 % (ref 0.0–0.2)

## 2021-07-14 LAB — BASIC METABOLIC PANEL
Anion gap: 3 — ABNORMAL LOW (ref 5–15)
BUN: 55 mg/dL — ABNORMAL HIGH (ref 8–23)
CO2: 25 mmol/L (ref 22–32)
Calcium: 8.3 mg/dL — ABNORMAL LOW (ref 8.9–10.3)
Chloride: 109 mmol/L (ref 98–111)
Creatinine, Ser: 2.65 mg/dL — ABNORMAL HIGH (ref 0.61–1.24)
GFR, Estimated: 25 mL/min — ABNORMAL LOW (ref >60)
Glucose, Bld: 148 mg/dL — ABNORMAL HIGH (ref 70–99)
Potassium: 5.4 mmol/L — ABNORMAL HIGH (ref 3.5–5.1)
Sodium: 137 mmol/L (ref 135–145)

## 2021-07-14 LAB — PREPARE RBC (CROSSMATCH)

## 2021-07-14 LAB — HAPTOGLOBIN: Haptoglobin: 436 mg/dL — ABNORMAL HIGH (ref 34–355)

## 2021-07-14 LAB — MAGNESIUM: Magnesium: 1.9 mg/dL (ref 1.7–2.4)

## 2021-07-14 MED ORDER — FUROSEMIDE 10 MG/ML IJ SOLN
80.0000 mg | Freq: Two times a day (BID) | INTRAMUSCULAR | Status: DC
  Administered 2021-07-14 – 2021-07-15 (×4): 80 mg via INTRAVENOUS
  Filled 2021-07-14 (×5): qty 8

## 2021-07-14 MED ORDER — TRAZODONE HCL 50 MG PO TABS
50.0000 mg | ORAL_TABLET | Freq: Every evening | ORAL | Status: DC | PRN
  Administered 2021-07-14 – 2021-07-17 (×2): 50 mg via ORAL
  Filled 2021-07-14 (×2): qty 1

## 2021-07-14 MED ORDER — METOPROLOL TARTRATE 50 MG PO TABS
50.0000 mg | ORAL_TABLET | Freq: Two times a day (BID) | ORAL | Status: DC
  Administered 2021-07-14 – 2021-07-18 (×8): 50 mg via ORAL
  Filled 2021-07-14 (×8): qty 1

## 2021-07-14 MED ORDER — METOPROLOL TARTRATE 25 MG PO TABS
25.0000 mg | ORAL_TABLET | Freq: Four times a day (QID) | ORAL | Status: AC
  Administered 2021-07-14: 25 mg via ORAL
  Filled 2021-07-14: qty 1

## 2021-07-14 MED ORDER — SODIUM ZIRCONIUM CYCLOSILICATE 10 G PO PACK
10.0000 g | PACK | Freq: Once | ORAL | Status: AC
  Administered 2021-07-14: 10 g via ORAL
  Filled 2021-07-14: qty 1

## 2021-07-14 NOTE — Progress Notes (Addendum)
PROGRESS NOTE    Matthew [name]   HCW:[id number]  DOB: March 02, 1951  PCP: [name], MD    DOA: 07/11/2021 LOS: 3    Brief Narrative / Hospital Course to Date:   70 year old man with COPD, chronic atrial fibrillation on Eliquis, essential hypertension, chronic kidney disease stage IV.  He presents with pain in his feet and found to be in rapid atrial fibrillation, with acute anemia having a 4 g drop in hemoglobin over the past month and given a unit of blood.  Assessment & Plan   Principal Problem:   Atrial fibrillation with rapid ventricular response (HCC) Active Problems:   Stage 3b chronic kidney disease (HCC)   Obesity (BMI 30-39.9)   Acute respiratory failure (HCC)   Acute gouty arthritis   Anemia of chronic disease   Heart failure with preserved ejection fraction (HCC)   Atrial fibrillation with RVR - HR now controlled.   --Cardiology following, see their recs --Continue Cardizem CD 300 mg  --Continue metoprolol 25 QID per cards.  --Eliquis on hold due to anemia  Symptomatic anemia - POA with 4 pt Hbg drop in 1 month's time.  Pt denies bloody, melanotic or dark/tarry stools.   Labs reflect anemia of chronic disease.  LDH and Tbili normal.  Haptoglobin elevated 436.  Patient declines GI evaluation, agrees to check fecal occult for blood. Hbg stable and improving 7.9>>8.4>> today 9.0 --FOBT pending - not yet sent --Hold ASA and Eliquis --GI evaluation as outpatient  Hyperkalemia - K 5.4 past 2 days.   --Lokelma today --Monitor BMP.  Acute gouty arthritis - involving bilateral feet.  Pain Improved with prednisone and colchicine.  Prednisone x 3 days completed (11/28-11/30). --Continue colchicine --Monitor  Chronic kidney disease stage IV - Renal function fairly stable. --Monitor BMP  Vitamin B12 deficiency - Continue oral B12  Depression - resumed on Cymbalta and Remeron.   His wife reported pt had been off his Cymbalta for a few days.  Generalized  Weakness - PT evaluation.  SNF recommended for short-term rehab at d/c. TOC following.  COPD - stable, no wheezing or signs of exacerbation. --Continue bronchodilators   Obesity: Body mass index is 40.25 kg/m.  Complicates overall care and prognosis.  Recommend lifestyle modifications including physical activity and diet for weight loss and overall long-term health.   DVT prophylaxis: SCDs Start: 07/11/21 1732   Diet:  Diet Orders (From admission, onward)     Start     Ordered   07/11/21 1731  Diet 2 gram sodium Room service appropriate? Yes; Fluid consistency: Thin  Diet effective now       Question Answer Comment  Room service appropriate? Yes   Fluid consistency: Thin      07/11/21 1732              Code Status: Full Code   Subjective 07/14/21    Pt reports being very tired, not sleeping well at night and requests something to help him sleep.  Reports normal colored brown BM, does not think sample sent to check for blood.  Says feeling much better overall.  Admits he is weaker than his baseline and remains agreeable to rehab.    Disposition Plan & Communication   Status is: Inpatient  Remains inpatient appropriate because: Ongoing evaluation of anemia.  Pending SNF placement, requires short-term rehab at d/c.     Consults, Procedures, Significant Events   Consultants:  Cardiology  Procedures:  None  Antimicrobials:  Anti-infectives (From admission,  onward)    None         Micro    Objective   Vitals:   07/14/21 0930 07/14/21 1134 07/14/21 1622 07/14/21 1944  BP:  134/86 (!) 142/81 122/72  Pulse:  84 90 (!) 104  Resp: 15 20 15 18   Temp:  98.7 F (37.1 C) 98.3 F (36.8 C) 97.6 F (36.4 C)  TempSrc:      SpO2:  100% 99% 99%  Weight:      Height:        Intake/Output Summary (Last 24 hours) at 07/14/2021 1946 Last data filed at 07/14/2021 1922 Gross per 24 hour  Intake 1200 ml  Output 3975 ml  Net -2775 ml   Filed Weights    07/11/21 1323 07/14/21 0431  Weight: 127 kg (!) 142.2 kg    Physical Exam:  General exam: awake, alert, no acute distress, obese HEENT: moist mucus membranes, hearing grossly normal  Respiratory system: normal respiratory effort at rest, on 2 L/min O2, lungs cleart no wheezes heard. Cardiovascular system: normal S1/S2, RRR, b/l LE pitting edema appears stable.   Central nervous system: A&O x3. no gross focal neurologic deficits, normal speech Psychiatry: normal mood, congruent affect, judgement and insight appear normal  Labs   Data Reviewed: I have personally reviewed following labs and imaging studies  CBC: Recent Labs  Lab 07/11/21 1325 07/12/21 0526 07/13/21 0545 07/14/21 0446  WBC 5.1 3.7* 5.7 7.0  NEUTROABS 3.0  --   --   --   HGB 8.1* 8.8* 8.4* 9.0*  HCT 26.9* 28.9* 27.6* 30.2*  MCV 106.3* 102.8* 102.6* 104.5*  PLT 240 247 253 237   Basic Metabolic Panel: Recent Labs  Lab 07/11/21 1325 07/12/21 0526 07/13/21 0545 07/14/21 0446  NA 137 135 136 137  K 5.0 4.9 5.4* 5.4*  CL 108 107 108 109  CO2 23 21* 23 25  GLUCOSE 102* 171* 164* 148*  BUN 45* 45* 52* 55*  CREATININE 2.28* 2.35* 2.63* 2.65*  CALCIUM 8.5* 8.4* 8.6* 8.3*  MG  --   --   --  1.9   GFR: Estimated Creatinine Clearance: 38.4 mL/min (A) (by C-G formula based on SCr of 2.65 mg/dL (H)). Liver Function Tests: Recent Labs  Lab 07/12/21 1520  AST 18  ALT 26  ALKPHOS 55  BILITOT 0.7  PROT 5.6*  ALBUMIN 2.2*   No results for input(s): LIPASE, AMYLASE in the last 168 hours. No results for input(s): AMMONIA in the last 168 hours. Coagulation Profile: No results for input(s): INR, PROTIME in the last 168 hours. Cardiac Enzymes: No results for input(s): CKTOTAL, CKMB, CKMBINDEX, TROPONINI in the last 168 hours. BNP (last 3 results) No results for input(s): PROBNP in the last 8760 hours. HbA1C: No results for input(s): HGBA1C in the last 72 hours. CBG: No results for input(s): GLUCAP in the  last 168 hours. Lipid Profile: No results for input(s): CHOL, HDL, LDLCALC, TRIG, CHOLHDL, LDLDIRECT in the last 72 hours. Thyroid Function Tests: No results for input(s): TSH, T4TOTAL, FREET4, T3FREE, THYROIDAB in the last 72 hours. Anemia Panel: Recent Labs    07/12/21 0526  FERRITIN 288   Sepsis Labs: No results for input(s): PROCALCITON, LATICACIDVEN in the last 168 hours.  Recent Results (from the past 240 hour(s))  Resp Panel by RT-PCR (Flu A&B, Covid) Nasopharyngeal Swab     Status: None   Collection Time: 07/11/21  6:45 PM   Specimen: Nasopharyngeal Swab; Nasopharyngeal(NP) swabs in vial transport medium  Result Value Ref Range Status   SARS Coronavirus 2 by RT PCR NEGATIVE NEGATIVE Final    Comment: (NOTE) SARS-CoV-2 target nucleic acids are NOT DETECTED.  The SARS-CoV-2 RNA is generally detectable in upper respiratory specimens during the acute phase of infection. The lowest concentration of SARS-CoV-2 viral copies this assay can detect is 138 copies/mL. A negative result does not preclude SARS-Cov-2 infection and should not be used as the sole basis for treatment or other patient management decisions. A negative result may occur with  improper specimen collection/handling, submission of specimen other than nasopharyngeal swab, presence of viral mutation(s) within the areas targeted by this assay, and inadequate number of viral copies(<138 copies/mL). A negative result must be combined with clinical observations, patient history, and epidemiological information. The expected result is Negative.  Fact Sheet for Patients:  EntrepreneurPulse.com.au  Fact Sheet for Healthcare Providers:  IncredibleEmployment.be  This test is no t yet approved or cleared by the [place] FDA and  has been authorized for detection and/or diagnosis of SARS-CoV-2 by FDA under an Emergency Use Authorization (EUA). This EUA will remain  in effect  (meaning this test can be used) for the duration of the COVID-19 declaration under Section 564(b)(1) of the Act, 21 U.S.C.section 360bbb-3(b)(1), unless the authorization is terminated  or revoked sooner.       Influenza A by PCR NEGATIVE NEGATIVE Final   Influenza B by PCR NEGATIVE NEGATIVE Final    Comment: (NOTE) The Xpert Xpress SARS-CoV-2/FLU/RSV plus assay is intended as an aid in the diagnosis of influenza from Nasopharyngeal swab specimens and should not be used as a sole basis for treatment. Nasal washings and aspirates are unacceptable for Xpert Xpress SARS-CoV-2/FLU/RSV testing.  Fact Sheet for Patients: EntrepreneurPulse.com.au  Fact Sheet for Healthcare Providers: IncredibleEmployment.be  This test is not yet approved or cleared by the [place] FDA and has been authorized for detection and/or diagnosis of SARS-CoV-2 by FDA under an Emergency Use Authorization (EUA). This EUA will remain in effect (meaning this test can be used) for the duration of the COVID-19 declaration under Section 564(b)(1) of the Act, 21 U.S.C. section 360bbb-3(b)(1), unless the authorization is terminated or revoked.  Performed at [facility], [address], [place], [place] [address]       Imaging Studies   No results found.   Medications   Scheduled Meds:  colchicine  0.3 mg Oral Daily   diltiazem  300 mg Oral Daily   DULoxetine  60 mg Oral BID   furosemide  80 mg Intravenous BID   melatonin  5 mg Oral QHS   metoprolol tartrate  50 mg Oral BID   mirtazapine  15 mg Oral QHS   mometasone-formoterol  2 puff Inhalation BID   multivitamin with minerals  1 tablet Oral Daily   pantoprazole  40 mg Oral Daily   tamsulosin  0.4 mg Oral QPC supper   vitamin B-12  1,000 mcg Oral Daily   Continuous Infusions:     LOS: 3 days    Time spent: 25 minutes with > 50% spent at bedside and in coordination of care     Ezekiel Slocumb, DO [facility]  07/14/2021, 7:46 PM      If 7PM-7AM, please contact night-coverage. How to contact the [facility] Attending or Consulting provider [place] or covering provider during after hours [place], for this patient?    Check the care team in [facility] and look for a) attending/consulting TR[facility]  provider listed and b) the [facility] team listed Log into [email or web address] and use [place]'s universal password to access. If you do not have the password, please contact the hospital operator. Locate the [facility] provider you are looking for under [facility] and page to a number that you can be directly reached. If you still have difficulty reaching the provider, please page the [facility] (Director on Call) for the Hospitalists listed on amion for assistance.

## 2021-07-14 NOTE — Progress Notes (Signed)
Progress Note  Patient Name: Matthew [name] Date of Encounter: 07/14/2021  Primary Cardiologist: Nelva Bush, MD  Subjective   Breathing improved minimally.  Remains in afib but rates better - 70's to 90's.  Denies palpitations.  Still req O2 via [place].  Pt notes that he had been having increasing dyspnea and lower ext edema over a course of about 2 wks prior to admission.  He doesn't routinely weigh himself @ home but bedscale wt this AM recorded @ 142.2kg, which is roughly 9 kg above 11/23 wt.  Inpatient Medications    Scheduled Meds:  colchicine  0.3 mg Oral Daily   diltiazem  300 mg Oral Daily   DULoxetine  60 mg Oral BID   furosemide  80 mg Intravenous BID   melatonin  5 mg Oral QHS   metoprolol tartrate  25 mg Oral Q6H   mirtazapine  15 mg Oral QHS   mometasone-formoterol  2 puff Inhalation BID   multivitamin with minerals  1 tablet Oral Daily   pantoprazole  40 mg Oral Daily   sodium zirconium cyclosilicate  10 g Oral Once   tamsulosin  0.4 mg Oral QPC supper   torsemide  20 mg Oral Daily   vitamin B-12  1,000 mcg Oral Daily   Continuous Infusions:  PRN Meds: acetaminophen, albuterol, allopurinol, bisacodyl, ipratropium-albuterol, ondansetron (ZOFRAN) IV, oxyCODONE, polyethylene glycol, traZODone   Vital Signs    Vitals:   07/14/21 0431 07/14/21 0434 07/14/21 0721 07/14/21 0930  BP:  (!) 97/54 126/88   Pulse:  80 76   Resp:  17 17 15   Temp:  98.1 F (36.7 C) 97.6 F (36.4 C)   TempSrc:      SpO2:  100% 100%   Weight: (!) 142.2 kg     Height:        Intake/Output Summary (Last 24 hours) at 07/14/2021 1123 Last data filed at 07/14/2021 1000 Gross per 24 hour  Intake 480 ml  Output 1675 ml  Net -1195 ml   Filed Weights   07/11/21 1323 07/14/21 0431  Weight: 127 kg (!) 142.2 kg    Physical Exam   GEN: OBese, in no acute distress.  HEENT: Grossly normal.  Neck: Supple, obese, difficult to gauge JVP 2/2 body habitus/facial hair.  No carotid bruits,  or masses. Cardiac: IR, IR, distant, no murmurs, rubs, or gallops. No clubbing, cyanosis.  1-2+ bilat LEE to calvves.  Radials 2+, DP/PT 2+ and equal bilaterally.  Respiratory:  Respirations regular and unlabored, diminished breath sounds throughout. GI: Obese, protuberant, semi-firm.  Nontender, BS + x 4. MS: no deformity or atrophy. Skin: warm and dry, no rash. Neuro:  Strength and sensation are intact. Psych: AAOx3.  Normal affect.  Labs    Chemistry Recent Labs  Lab 07/12/21 0526 07/12/21 1520 07/13/21 0545 07/14/21 0446  NA 135  --  136 137  K 4.9  --  5.4* 5.4*  CL 107  --  108 109  CO2 21*  --  23 25  GLUCOSE 171*  --  164* 148*  BUN 45*  --  52* 55*  CREATININE 2.35*  --  2.63* 2.65*  CALCIUM 8.4*  --  8.6* 8.3*  PROT  --  5.6*  --   --   ALBUMIN  --  2.2*  --   --   AST  --  18  --   --   ALT  --  26  --   --  ALKPHOS  --  55  --   --   BILITOT  --  0.7  --   --   GFRNONAA 29*  --  25* 25*  ANIONGAP 7  --  5 3*     Hematology Recent Labs  Lab 07/12/21 0526 07/13/21 0545 07/14/21 0446  WBC 3.7* 5.7 7.0  RBC 2.81* 2.69* 2.89*  HGB 8.8* 8.4* 9.0*  HCT 28.9* 27.6* 30.2*  MCV 102.8* 102.6* 104.5*  MCH 31.3 31.2 31.1  MCHC 30.4 30.4 29.8*  RDW 15.3 14.8 14.6  PLT 247 253 276    Cardiac Enzymes  Recent Labs  Lab 07/03/21 1541 07/04/21 0005 07/11/21 1325 07/11/21 1526  TROPONINIHS 14 11 12 11       BNP Recent Labs  Lab 07/11/21 1503  BNP 95.6   Lipids  Lab Results  Component Value Date   CHOL 101 06/13/2021   HDL 40 (L) 06/13/2021   LDLCALC 47 06/13/2021   TRIG 69 06/13/2021   CHOLHDL 2.5 06/13/2021    HbA1c  Lab Results  Component Value Date   HGBA1C 5.9 (H) 06/13/2021    Radiology    DG Chest 2 View  Result Date: 07/11/2021 CLINICAL DATA:  SOB EXAM: CHEST - 2 VIEW COMPARISON:  07/03/2021 FINDINGS: Stable cardiomediastinal contours. No new consolidation or edema. No pleural effusion or pneumothorax. The visualized skeletal  structures are unremarkable. IMPRESSION: No acute process in the chest. Electronically Signed   By: Macy Mis M.D.   On: 07/11/2021 14:23    Telemetry    Afib, 70's to 90's - Personally Reviewed  Cardiac Studies   Echo 06/13/21  1. AF with RVR precludes accurate EF assessment. Left ventricular  ejection fraction, by estimation, is 50 to 55%. The left ventricle has low  normal function. The left ventricle has no regional wall motion  abnormalities. Left ventricular diastolic  parameters are indeterminate.   2. Right ventricular systolic function is normal. The right ventricular  size is normal.   3. Left atrial size was mildly dilated.   4. The mitral valve is normal in structure. Trivial mitral valve  regurgitation.   5. The aortic valve is normal in structure. Aortic valve regurgitation is  not visualized. No aortic stenosis is present.   6. Aortic dilatation noted. There is mild dilatation of the aortic root,  measuring 41 mm.  _____________   [facility] 10/17/2018 Conclusion     Normal left ventricular function Normal coronaries   Conclusion Normal cardiac cath via right radial No evidence coronary disease Troponin represents demand ischemia not non-STEMI  _____________   Patient Profile     70 y.o. male  with a hx of HTN, asthma, obesity, COPD, chronic respiratory failure on 2L O2 CKD stage 3, afib on Eliquis, anemia of chronic dzs, OSA not on CPAP who presented to the ED on 11/28 w/ dyspnea, fatigue, and pain in his feet, and was found to be in afib w/ rvr in the setting of progressive anemia.  Assessment & Plan    1.  Persistent Afib:  Presented w/ fatigue and dyspnea, and found to be in afib w/ RVR.  Rates improved, currently trending 70's-80's on dilt cd 300mg  qd and metoprolol 25mg  q6h.  Review of prior notes and ECGs shows that he's been in afib at least since late October admission.  Ongoing Afib w/ intermittent rapid rates may be driving some of his dyspnea and now  CHF symtoms.  Ideally would like to give him an  opportunity to be in sinus rhythm, however, in the setting of anemia, eliquis is on hold pending further eval.  He is also a poor TEE candidate due to body habitus.  Cont rate control for now.  Will consolidate metoprolol to 50mg  BID beginning this evening.  Eliquis on hold in the setting of anemia w/ drop in hgb from 11.1 on 11/5 to 7.9 on 11/24.    2. Acute on chronic HFpEF:  EF 50-55% on echo 06/13/2021.  Says that over a 2 wk period prior to admission, that he was noting increasing DOE, abd girth, and lower ext edema.  He doesn't weigh himself @ home.  BNP was nl on admission.  Body habitus does make exam challenging but his abd is firm, he has 1-2+ bilat lower ext edema, and his wt is up at least 9 kg since earlier this month (wts reports quite variable but he was weighed this AM in the bed).  As above, ongoing afib and loss of atrial kick may be driving diast CHF.  He has been receiving torsemide and have stopped this and added Lasix 80 mg IV twice daily.  Follow-up renal function in the a.m.  3.  Macrocytic anemia: Etiology of drop in hemoglobin from early November to now is unclear.  Work-up per medicine/GI.  Eliquis remains on hold at least until this work-up is completed.  4.  Essential hypertension: Stable.  5.  COPD/chronic respiratory failure: Inhalers/nebulizers per medicine.  Diminished breath sounds on exam.  Diuresing as above.  6.  Obstructive sleep apnea: We will need compliance with CPAP as outpatient.  Signed, Murray Hodgkins, NP  07/14/2021, 11:23 AM    For questions or updates, please contact   Please consult [email or web address] for contact info under Cardiology/STEMI.

## 2021-07-14 NOTE — TOC Progression Note (Signed)
Transition of Care [facility]) - Progression Note    Patient Details  Name: Matthew [name] MRN: [id number] Date of Birth: [birth year]  [facility] CM/SW [place], [place] Phone Number: 07/14/2021, 10:50 AM  Clinical Narrative:      CSW spoke with patient who reports his "brain is not working right" and he wants his wife to make decisions. Confirmed that correct phone number is in chart.   CSW has called patient's wife [name] x2, no answer and vm is full. Patient reports he's unsure if she will be coming to hospital today, notified RN to please let this CSW know should wife arrive to discuss dc planning if agreeable to SNF recs.       Expected Discharge Plan and Services                                                 Social Determinants of Health (SDOH) Interventions    Readmission Risk Interventions No flowsheet data found.

## 2021-07-14 NOTE — NC FL2 (Signed)
[place] LEVEL OF CARE SCREENING TOOL     IDENTIFICATION  Patient Name: Matthew [name] Birthdate: [birth year] Sex: male Admission Date (Current Location): 07/11/2021  [facility] [place] and [place] Number:  Engineering geologist and Address:  [facility], [address] [place], [place] [address]      Provider Number: [id number]  Attending Physician Name and Address:  Ezekiel Slocumb, DO  Relative Name and Phone Number:  [name] (wife) [phone]    Current Level of Care: Hospital Recommended Level of Care: [place] Prior Approval Number:    Date Approved/Denied:   PASRR Number: [id number]  Discharge Plan: SNF    Current Diagnoses: Patient Active Problem List   Diagnosis Date Noted   Heart failure with preserved ejection fraction (HCC)    CKD (chronic kidney disease), stage IV ([place] )    Vitamin B12 deficiency    Depression    Atrial fibrillation with rapid ventricular response ([place]) 07/11/2021   Anemia of chronic disease 07/11/2021   Asthma exacerbation 07/03/2021   Chest pain 07/03/2021   Acute gouty arthritis    Acute diastolic congestive heart failure ([place])    Right leg pain    Weakness    SOB (shortness of breath) 06/12/2021   Bilateral leg edema 06/12/2021   Atrial fibrillation with RVR ([place]) 06/12/2021   AKI (acute kidney injury) ([place]) 06/12/2021   Elevated glucose level 06/12/2021   Obesity (BMI 30-39.9) 06/12/2021   Macrocytosis 06/12/2021   Acute respiratory failure ([place]) 06/12/2021   Chronic pain of left knee 12/16/2020   COPD with acute exacerbation ([place]) 12/04/2020   Acute right hip pain 12/04/2020   H/O bilateral hip replacements 12/04/2020   Stage 3b chronic kidney disease ([place]) 12/04/2020   Displaced comminuted fracture of shaft of left femur, initial encounter for closed fracture ([place]) 05/19/2020   Asthma 05/18/2020   Essential hypertension 05/18/2020   Femur fracture, left ([place]) 05/18/2020    Closed comminuted intra-articular fracture of distal femur ([place]) 05/17/2020   Moderate persistent asthma with exacerbation 10/15/2018    Orientation RESPIRATION BLADDER Height & Weight     Self, Time, Situation, Place  O2 (2L O2) Continent Weight: (!) 313 lb 7.9 oz (142.2 kg) Height:  6\' 2"  (188 cm)  BEHAVIORAL SYMPTOMS/MOOD NEUROLOGICAL BOWEL NUTRITION STATUS      Continent Diet  AMBULATORY STATUS COMMUNICATION OF NEEDS Skin   Limited Assist Verbally Other (Comment) (abrasion arm/hand)                       Personal Care Assistance Level of Assistance  Bathing, Feeding, Dressing, Total care Bathing Assistance: Limited assistance Feeding assistance: Independent Dressing Assistance: Limited assistance Total Care Assistance: Limited assistance   Functional Limitations Info  Sight, Hearing, Speech Sight Info: Adequate Hearing Info: Adequate Speech Info: Adequate    SPECIAL CARE FACTORS FREQUENCY  PT (By licensed PT), OT (By licensed OT)     PT Frequency: min 3x weekly OT Frequency: min 3x weekly            Contractures Contractures Info: Not present    Additional Factors Info  Code Status, Allergies Code Status Info: full Allergies Info: demerol, lisinopril           Current Medications (07/14/2021):  This is the current hospital active medication list Current Facility-Administered Medications  Medication Dose Route Frequency Provider Last Rate Last Admin   acetaminophen (TYLENOL) tablet 650 mg  650 mg Oral Q4H PRN  Collier Bullock, MD   650 mg at 07/13/21 1414   albuterol (PROVENTIL) (2.5 MG/3ML) 0.083% nebulizer solution 3 mL  3 mL Inhalation Q4H PRN Cox, Amy N, DO   3 mL at 07/13/21 1302   allopurinol (ZYLOPRIM) tablet 100 mg  100 mg Oral PRN Agbata, Tochukwu, MD       bisacodyl (DULCOLAX) EC tablet 5 mg  5 mg Oral Daily PRN Agbata, Tochukwu, MD       colchicine tablet 0.3 mg  0.3 mg Oral Daily Agbata, Tochukwu, MD   0.3 mg at 07/14/21 0933   diltiazem  (CARDIZEM CD) 24 hr capsule 300 mg  300 mg Oral Daily Wieting, Richard, MD   300 mg at 07/14/21 0932   DULoxetine (CYMBALTA) DR capsule 60 mg  60 mg Oral BID Agbata, Tochukwu, MD   60 mg at 07/14/21 0932   ipratropium-albuterol (DUONEB) 0.5-2.5 (3) MG/3ML nebulizer solution 3 mL  3 mL Nebulization Q6H PRN Agbata, Tochukwu, MD       melatonin tablet 5 mg  5 mg Oral QHS Agbata, Tochukwu, MD   5 mg at 07/13/21 2209   metoprolol tartrate (LOPRESSOR) tablet 25 mg  25 mg Oral Q6H End, Christopher, MD   25 mg at 07/14/21 6147   mirtazapine (REMERON) tablet 15 mg  15 mg Oral QHS Agbata, Tochukwu, MD   15 mg at 07/13/21 2209   mometasone-formoterol (DULERA) 200-5 MCG/ACT inhaler 2 puff  2 puff Inhalation BID Agbata, Tochukwu, MD   2 puff at 07/14/21 0934   multivitamin with minerals tablet 1 tablet  1 tablet Oral Daily Agbata, Tochukwu, MD   1 tablet at 07/14/21 0933   ondansetron (ZOFRAN) injection 4 mg  4 mg Intravenous Q6H PRN Agbata, Tochukwu, MD       oxyCODONE (Oxy IR/ROXICODONE) immediate release tablet 5 mg  5 mg Oral Q6H PRN Loletha Grayer, MD   5 mg at 07/13/21 2025   pantoprazole (PROTONIX) EC tablet 40 mg  40 mg Oral Daily Agbata, Tochukwu, MD   40 mg at 07/14/21 0932   polyethylene glycol (MIRALAX / GLYCOLAX) packet 17 g  17 g Oral Daily PRN Agbata, Tochukwu, MD       sodium zirconium cyclosilicate (LOKELMA) packet 10 g  10 g Oral Once Nicole Kindred A, DO       tamsulosin (FLOMAX) capsule 0.4 mg  0.4 mg Oral QPC supper Agbata, Tochukwu, MD   0.4 mg at 07/13/21 1817   torsemide (DEMADEX) tablet 20 mg  20 mg Oral Daily Loletha Grayer, MD   20 mg at 07/14/21 0929   vitamin B-12 (CYANOCOBALAMIN) tablet 1,000 mcg  1,000 mcg Oral Daily Loletha Grayer, MD   1,000 mcg at 07/14/21 5747     Discharge Medications: Please see discharge summary for a list of discharge medications.  Relevant Imaging Results:  Relevant Lab Results:   Additional Information SSN: [id number]  Alberteen Sam,  LCSW

## 2021-07-14 NOTE — Progress Notes (Signed)
Physical Therapy Treatment Patient Details Name: Matthew [name] MRN: [id number] DOB: 1950/10/23 Today's Date: 07/14/2021   History of Present Illness Patient is a 70 year old male who presents with medical history significant for COPD with chronic respiratory failure on 2 L of oxygen, history of atrial fibrillation on chronic anticoagulation therapy, hypertension with stage III chronic kidney disease, obesity and asthma    PT Comments    Pt declining OOB mobility today due to not feeling well, but was agreeable for supine therex. Pt continues to demonstrate hip flexor and quad weakness, likely resulting in inability to stand on eval, noted by quad lag, decreased AROM, and mm shaking, during SLR. Pt able to perform supine scoot for repositioning with min assist from therapist and use of BUE for support. Pt left in supine with bed in chair position, BLE elevated for edema management (measured as +3 pitting edema in BLE). Pt educated extensively regarding benefits of OOB mobility and participation with therapy for return to baseline function and to achieve goals. He verbalized understanding. Pt will continue to benefit from skilled acute PT services to address deficits for return to baseline function. Will continue to recommend SNF at DC.    Recommendations for follow up therapy are one component of a multi-disciplinary discharge planning process, led by the attending physician.  Recommendations may be updated based on patient status, additional functional criteria and insurance authorization.  Follow Up Recommendations  Skilled nursing-short term rehab (<3 hours/day)     Assistance Recommended at Discharge Frequent or constant Supervision/Assistance  Equipment Recommendations  None recommended by PT    Recommendations for Other Services       Precautions / Restrictions Precautions Precautions: Fall Restrictions Other Position/Activity Restrictions: watch HR     Mobility  Bed  Mobility Overal bed mobility: Needs Assistance             General bed mobility comments: able to perform supine scoot for repositioning with min assist via chuck pad, with use of UE on bedrail and bed in trendelenburg position    Transfers                   General transfer comment: pt declining        Balance       Sitting balance - Comments: pt declining OOB mobility                                    Cognition Arousal/Alertness: Awake/alert Behavior During Therapy: WFL for tasks assessed/performed Overall Cognitive Status: Within Functional Limits for tasks assessed                                 General Comments: able to follow 100% of simple 2-step commands        Exercises General Exercises - Lower Extremity Ankle Circles/Pumps: AROM;15 reps;Strengthening;Both Quad Sets: AROM;Strengthening;Both;15 reps Gluteal Sets: AROM;Strengthening;Both;15 reps Short Arc Quad: AROM;Strengthening;Both;15 reps Heel Slides: AROM;Strengthening;Both;15 reps Hip ABduction/ADduction: AROM;Strengthening;Both;15 reps Straight Leg Raises: Strengthening;Both;15 reps;AAROM Other Exercises Other Exercises: Pt declining OOB mobility due to not feeling well but was agreeable for BLE therex in supine. Able to perform posterior scoot with min assist and use of BUE on bed rail (bed in trendelenburg position). Left pt in bed with bed in chair position; BLE elevated for edema management. Other Exercises: Pt educated re: PT  role/POC, DC recommendations, benefits of participation/OOB mobility for return to baseline function, pain management, edema management (elevation/ankle pumps)        Pertinent Vitals/Pain Pain Score: 5  Pain Location: tongue - states that teeth have been scapring tongue raw Pain Intervention(s): Monitored during session     PT Goals (current goals can now be found in the care plan section) Acute Rehab PT Goals Patient Stated  Goal: to get stronger and go home PT Goal Formulation: With patient Time For Goal Achievement: 07/27/21 Potential to Achieve Goals: Good Progress towards PT goals: Progressing toward goals    Frequency    Min 2X/week      PT Plan Current plan remains appropriate       AM-PAC PT "6 Clicks" Mobility   Outcome Measure  Help needed turning from your back to your side while in a flat bed without using bedrails?: A Little Help needed moving from lying on your back to sitting on the side of a flat bed without using bedrails?: A Little Help needed moving to and from a bed to a chair (including a wheelchair)?: A Lot Help needed standing up from a chair using your arms (e.g., wheelchair or bedside chair)?: A Lot Help needed to walk in hospital room?: A Lot Help needed climbing 3-5 steps with a railing? : A Lot 6 Click Score: 14    End of Session   Activity Tolerance: Patient limited by fatigue Patient left: in bed;with call bell/phone within reach;with bed alarm set (bed in chair position; BLE elevated on pillow) Nurse Communication: Mobility status PT Visit Diagnosis: Muscle weakness (generalized) (M62.81);History of falling (Z91.81);Difficulty in walking, not elsewhere classified (R26.2);Repeated falls (R29.6);Unsteadiness on feet (R26.81)     Time: 1610-9604 PT Time Calculation (min) (ACUTE ONLY): 25 min  Charges:  $Therapeutic Exercise: 8-22 mins                      Herminio Commons, PT, DPT 3:51 PM,07/14/21

## 2021-07-15 DIAGNOSIS — J9601 Acute respiratory failure with hypoxia: Secondary | ICD-10-CM | POA: Diagnosis not present

## 2021-07-15 DIAGNOSIS — D638 Anemia in other chronic diseases classified elsewhere: Secondary | ICD-10-CM | POA: Diagnosis not present

## 2021-07-15 DIAGNOSIS — N1832 Chronic kidney disease, stage 3b: Secondary | ICD-10-CM | POA: Diagnosis not present

## 2021-07-15 DIAGNOSIS — I4891 Unspecified atrial fibrillation: Secondary | ICD-10-CM | POA: Diagnosis not present

## 2021-07-15 DIAGNOSIS — I5033 Acute on chronic diastolic (congestive) heart failure: Secondary | ICD-10-CM

## 2021-07-15 LAB — BASIC METABOLIC PANEL
Anion gap: 6 (ref 5–15)
BUN: 65 mg/dL — ABNORMAL HIGH (ref 8–23)
CO2: 27 mmol/L (ref 22–32)
Calcium: 8.4 mg/dL — ABNORMAL LOW (ref 8.9–10.3)
Chloride: 105 mmol/L (ref 98–111)
Creatinine, Ser: 2.59 mg/dL — ABNORMAL HIGH (ref 0.61–1.24)
GFR, Estimated: 26 mL/min — ABNORMAL LOW (ref >60)
Glucose, Bld: 96 mg/dL (ref 70–99)
Potassium: 4.7 mmol/L (ref 3.5–5.1)
Sodium: 138 mmol/L (ref 135–145)

## 2021-07-15 LAB — CBC
HCT: 30.5 % — ABNORMAL LOW (ref 39.0–52.0)
Hemoglobin: 9.1 g/dL — ABNORMAL LOW (ref 13.0–17.0)
MCH: 30.8 pg (ref 26.0–34.0)
MCHC: 29.8 g/dL — ABNORMAL LOW (ref 30.0–36.0)
MCV: 103.4 fL — ABNORMAL HIGH (ref 80.0–100.0)
Platelets: 270 10*3/uL (ref 150–400)
RBC: 2.95 MIL/uL — ABNORMAL LOW (ref 4.22–5.81)
RDW: 14.6 % (ref 11.5–15.5)
WBC: 7.9 10*3/uL (ref 4.0–10.5)
nRBC: 0.3 % — ABNORMAL HIGH (ref 0.0–0.2)

## 2021-07-15 MED ORDER — APIXABAN 5 MG PO TABS
5.0000 mg | ORAL_TABLET | Freq: Two times a day (BID) | ORAL | Status: DC
  Administered 2021-07-15 – 2021-07-18 (×7): 5 mg via ORAL
  Filled 2021-07-15 (×7): qty 1

## 2021-07-15 MED ORDER — ALPRAZOLAM 0.25 MG PO TABS
0.2500 mg | ORAL_TABLET | Freq: Two times a day (BID) | ORAL | Status: DC | PRN
  Administered 2021-07-15: 0.25 mg via ORAL
  Filled 2021-07-15: qty 1

## 2021-07-15 NOTE — Care Management Important Message (Signed)
Important Message  Patient Details  Name: Matthew [name] MRN: [id number] Date of Birth: [birth year]   Medicare Important Message Given:  Yes     Dannette Barbara 07/15/2021, 3:02 PM

## 2021-07-15 NOTE — Progress Notes (Signed)
Progress Note  Patient Name: Matthew [name] Date of Encounter: 07/15/2021  Primary Cardiologist: Nelva Bush, MD  Subjective   Breathing is same as yesterday. Still experiencing DOE with ambulation. Requiring 2L [place], not on home O2.  Remains in afib with rates 80s-100s.   Inpatient Medications    Scheduled Meds:  colchicine  0.3 mg Oral Daily   diltiazem  300 mg Oral Daily   DULoxetine  60 mg Oral BID   furosemide  80 mg Intravenous BID   melatonin  5 mg Oral QHS   metoprolol tartrate  50 mg Oral BID   mirtazapine  15 mg Oral QHS   mometasone-formoterol  2 puff Inhalation BID   multivitamin with minerals  1 tablet Oral Daily   pantoprazole  40 mg Oral Daily   tamsulosin  0.4 mg Oral QPC supper   vitamin B-12  1,000 mcg Oral Daily   Continuous Infusions:  PRN Meds: acetaminophen, albuterol, allopurinol, bisacodyl, ipratropium-albuterol, ondansetron (ZOFRAN) IV, oxyCODONE, polyethylene glycol, traZODone   Vital Signs    Vitals:   07/15/21 0001 07/15/21 0452 07/15/21 0458 07/15/21 0614  BP: 108/72 120/77 (!) 142/90   Pulse: 85 93 99   Resp: 16 18 16    Temp: 98.1 F (36.7 C) 97.8 F (36.6 C) 98 F (36.7 C)   TempSrc:      SpO2: 100% 94% 98%   Weight:    (!) 141.1 kg  Height:        Intake/Output Summary (Last 24 hours) at 07/15/2021 0737 Last data filed at 07/15/2021 0615 Gross per 24 hour  Intake 960 ml  Output 4875 ml  Net -3915 ml   Filed Weights   07/11/21 1323 07/14/21 0431 07/15/21 0614  Weight: 127 kg (!) 142.2 kg (!) 141.1 kg    Physical Exam   GEN: Obese, well developed, in no acute distress.  HEENT: Grossly normal.  Neck: no JVD, carotid bruits, or masses. Cardiac: irregularly irregular, distant, no murmurs, rubs, or gallops. No clubbing, cyanosis.  Radials 2+, DP/PT 1+ and equal bilaterally. 2+ BLE  Respiratory:  Respirations regular and unlabored, diminished bilaterally - more so in bases, slight expiratory wheeze to auscultation  bilaterally. GI: obese, nontender, soft, nondistended, BS + x 4. MS: no deformity or atrophy. Skin: warm and dry, no rash. Neuro:  Strength and sensation are intact. Psych: AAOx3.  Normal affect.  Labs    Chemistry Recent Labs  Lab 07/12/21 1520 07/13/21 0545 07/14/21 0446 07/15/21 0531  NA  --  136 137 138  K  --  5.4* 5.4* 4.7  CL  --  108 109 105  CO2  --  23 25 27   GLUCOSE  --  164* 148* 96  BUN  --  52* 55* 65*  CREATININE  --  2.63* 2.65* 2.59*  CALCIUM  --  8.6* 8.3* 8.4*  PROT 5.6*  --   --   --   ALBUMIN 2.2*  --   --   --   AST 18  --   --   --   ALT 26  --   --   --   ALKPHOS 55  --   --   --   BILITOT 0.7  --   --   --   GFRNONAA  --  25* 25* 26*  ANIONGAP  --  5 3* 6     Hematology Recent Labs  Lab 07/13/21 0545 07/14/21 0446 07/15/21 0531  WBC 5.7 7.0 7.9  RBC 2.69* 2.89* 2.95*  HGB 8.4* 9.0* 9.1*  HCT 27.6* 30.2* 30.5*  MCV 102.6* 104.5* 103.4*  MCH 31.2 31.1 30.8  MCHC 30.4 29.8* 29.8*  RDW 14.8 14.6 14.6  PLT 253 276 270    Cardiac Enzymes  Recent Labs  Lab 07/03/21 1541 07/04/21 0005 07/11/21 1325 07/11/21 1526  TROPONINIHS 14 11 12 11       BNP Recent Labs  Lab 07/11/21 1503  BNP 95.6    Lipids  Lab Results  Component Value Date   CHOL 101 06/13/2021   HDL 40 (L) 06/13/2021   LDLCALC 47 06/13/2021   TRIG 69 06/13/2021   CHOLHDL 2.5 06/13/2021    HbA1c  Lab Results  Component Value Date   HGBA1C 5.9 (H) 06/13/2021    Radiology    DG Chest 2 View  Result Date: 07/11/2021 CLINICAL DATA:  SOB EXAM: CHEST - 2 VIEW COMPARISON:  07/03/2021 FINDINGS: Stable cardiomediastinal contours. No new consolidation or edema. No pleural effusion or pneumothorax. The visualized skeletal structures are unremarkable. IMPRESSION: No acute process in the chest. Electronically Signed   By: Macy Mis M.D.   On: 07/11/2021 14:23    Telemetry    Afib with rates 80s-100s - Personally Reviewed  Cardiac Studies   LHC  10/17/2018 Conclusion     Normal left ventricular function Normal coronaries   Conclusion Normal cardiac cath via right radial No evidence coronary disease Troponin represents demand ischemia not non-STEMI  _____________   Echo 06/13/21  1. AF with RVR precludes accurate EF assessment. Left ventricular  ejection fraction, by estimation, is 50 to 55%. The left ventricle has low  normal function. The left ventricle has no regional wall motion  abnormalities. Left ventricular diastolic  parameters are indeterminate.   2. Right ventricular systolic function is normal. The right ventricular  size is normal.   3. Left atrial size was mildly dilated.   4. The mitral valve is normal in structure. Trivial mitral valve  regurgitation.   5. The aortic valve is normal in structure. Aortic valve regurgitation is  not visualized. No aortic stenosis is present.   6. Aortic dilatation noted. There is mild dilatation of the aortic root,  measuring 41 mm.  _____________   Patient Profile     70 y.o. male with past medical history of HTN, asthma, obesity, COPD, Chronic respiratory failure, CKD stage 3, A-fib (on eliquis), anemia of chronic disease, OSA who presented to the ED on 11/28 with dyspnea, fatigue, and pain in his feet. Was found to be in A-fib RVR.   Assessment & Plan    1. Persistent afib: Presented w/ DOE and fatigue. Found to be in A-fib RVR. Rates have improved between 80s-100s on dilt cd 300 mg qd and metoprolol 50mg  BID. He has been in a-fib since at least October 2022. Eliquis on hold for drop in HBG on 11/24 (7.9) though it has improved to 9.1 today. He initially refused GI workup, but states he is now agreeable to GI w/u - either as inpt or outpt.   Would ideally want to patient in SR, but he is poor candidate for TEE/cardioversion given body habitus and noncompliance with CPAP. Continue rate control with dilt and metoprolol.  W/ stable H/H, we will need to consider retrial of  eliquis given elevated stroke risk.  Will d/w Dr. Rockey Situ.  2. Acute on chronic HFpEF: 2 Weeks prior to admission he noticed increasing DOE, BLE edema, and increasing abd girth. He presented  after his feet had swollen so much it was causing him pain. BNP was WNL. Weight was up 9 kg since last recorded weight. Still has persistent 2+ BLE. He has responded well to Lasix with net diuresis of approximately 4 L. He believes his breathing has improved slightly since admission.  Continue 80 mg Lasix IV BID.  Creat better this AM. BUN sl higher - follow w/ ongoing diuresis.  3. Macrocytic anemia: Has had continue drop in HBG from 11.1 on 11/5 to 7.9 on 11/24. HBG has improved this admission with 9.1 today. He states he is agreeable to GI eva. - inpt vs outpt. Denies dark stool or any signs of bleeding.  Will consider retrial of eliquis.  4. CKDIII-IV: Creatinine down today at 2.59. Started on 80 mg IV Lasix BID yesterday. Continue Lasix and monitor renal panel daily.  5. Essential Hypertension: Stable.   6. COPD/ chronic respiratory failure: Inhalers/ nebs per medicine. Slight bilateral wheeze on exam. On 2 L , states he does not wear O2 at home.   7. OSA: Does not wear CPAP. Educated on importance with compliance.   Signed, Murray Hodgkins, NP  07/15/2021, 7:37 AM    For questions or updates, please contact   Please consult [email or web address] for contact info under Cardiology/STEMI.

## 2021-07-15 NOTE — Progress Notes (Signed)
PROGRESS NOTE    Matthew [name]   JJK:[id number]  DOB: August 11, 1951  PCP: [name], MD    DOA: 07/11/2021 LOS: 4    Brief Narrative / Hospital Course to Date:   70 year old man with COPD, chronic atrial fibrillation on Eliquis, essential hypertension, chronic kidney disease stage IV.  He presents with pain in his feet and found to be in rapid atrial fibrillation, with acute anemia having a 4 g drop in hemoglobin over the past month and given a unit of blood.  Assessment & Plan   Principal Problem:   Atrial fibrillation with rapid ventricular response (HCC) Active Problems:   Stage 3b chronic kidney disease (HCC)   Obesity (BMI 30-39.9)   Acute respiratory failure (HCC)   Acute gouty arthritis   Anemia of chronic disease   Heart failure with preserved ejection fraction (HCC)   Atrial fibrillation with RVR, Persistent A-fib -  HR now controlled.   --Cardiology following, see their recs --Continue Cardizem CD 300 mg  --Continue metoprolol 25 QID per cards.  --Eliquis held due to anemia, resume and monitor  Acute on chronic diastolic CHF -Echo on 37/16/9678 -EF 50 to 55%, indeterminate diastolic parameters. Patient has pitting edema bilateral lower extremities spreading proximally to his trunk, abdominal distention, consistent with volume overload.  Weight is also up this admission.  Mildly improved after started on IV diuresis. Net IO Since Admission: -6,855 mL [07/15/21 1625] --Management per cardiology --Continue IV diuresis --Strict I/O's and daily weights --Monitor renal function electrolytes --Low-sodium diet and fluid restriction --Plan to discharge on oral Lasix  Symptomatic anemia - POA with 4 pt Hbg drop in 1 month's time.  Pt denies bloody, melanotic or dark/tarry stools.   Labs reflect anemia of chronic disease.  LDH and Tbili normal.  Haptoglobin elevated 436.  Patient declines GI evaluation, agrees to check fecal occult for blood. Hbg stable and  improving 7.9>>8.4>> 9.0>>9.1 --FOBT pending - not yet sent --GI evaluation as outpatient --Resume Eliquis and closely monitor --Will need GI evaluation if bleeding recurs  Hyperkalemia - Resolved. K 5.4 on 11/30 and 12/1, given Lokelma. K 4.7 today. --Monitor BMP.  Acute gouty arthritis - involving bilateral feet.  Pain Improved with prednisone and colchicine.  Prednisone x 3 days completed (11/28-11/30). --Continue colchicine --Monitor  Chronic kidney disease stage IV - Renal function fairly stable. Cr increased 2.28 up to 2.65, today slightly improved to 2.59 after diuresis yesterday.  Suspect cardiorenal syndrome. --Monitor BMP --Diuresis per cardiology  Vitamin B12 deficiency - Continue oral B12  Depression - resumed on Cymbalta and Remeron.   His wife reported pt had been off his Cymbalta for a few days.  Generalized Weakness - PT evaluation.   SNF recommended for short-term rehab at d/c.  TOC following.  COPD - stable, no wheezing or signs of exacerbation. --Continue bronchodilators   Obesity: Body mass index is 39.94 kg/m.  Complicates overall care and prognosis.  Recommend lifestyle modifications including physical activity and diet for weight loss and overall long-term health.   DVT prophylaxis: SCDs Start: 07/11/21 1732 apixaban (ELIQUIS) tablet 5 mg   Diet:  Diet Orders (From admission, onward)     Start     Ordered   07/11/21 1731  Diet 2 gram sodium Room service appropriate? Yes; Fluid consistency: Thin  Diet effective now       Question Answer Comment  Room service appropriate? Yes   Fluid consistency: Thin      07/11/21 1732  Code Status: Full Code   Subjective 07/15/21    Pt seen awake resting in bed this morning.  He reports tolerating BiPAP overnight fairly well.  Reports shortness of breath is slightly better today.  Reports time out of urine output with diuresis.  No other acute complaints at this time.   Disposition Plan  & Communication   Status is: Inpatient  Remains inpatient appropriate because: Ongoing IV diuresis.  Pending SNF placement, requires short-term rehab at d/c.     Consults, Procedures, Significant Events   Consultants:  Cardiology  Procedures:  None  Antimicrobials:  Anti-infectives (From admission, onward)    None         Micro    Objective   Vitals:   07/15/21 0614 07/15/21 0815 07/15/21 1154 07/15/21 1602  BP:  (!) 129/93 114/78 (!) 143/83  Pulse:  66 99 70  Resp:  18 18 18   Temp:  98.6 F (37 C) 98.5 F (36.9 C) 98.5 F (36.9 C)  TempSrc:      SpO2:  96% 100% 100%  Weight: (!) 141.1 kg     Height:        Intake/Output Summary (Last 24 hours) at 07/15/2021 1617 Last data filed at 07/15/2021 1323 Gross per 24 hour  Intake 720 ml  Output 4875 ml  Net -4155 ml   Filed Weights   07/11/21 1323 07/14/21 0431 07/15/21 0614  Weight: 127 kg (!) 142.2 kg (!) 141.1 kg    Physical Exam:  General exam: awake, alert, no acute distress, obese Respiratory system: Lungs clear bilaterally, no wheezes or rhonchi, normal respiratory effort at rest, on 2 L minute nasal cannula O2 Cardiovascular system: normal S1/S2, RRR, slight improvement in bilateral lower extremity pitting edema 2-3+ Central nervous system: A&O x3.  Normal speech, grossly nonfocal exam Psychiatry: Normal mood, congruent affect  Labs   Data Reviewed: I have personally reviewed following labs and imaging studies  CBC: Recent Labs  Lab 07/11/21 1325 07/12/21 0526 07/13/21 0545 07/14/21 0446 07/15/21 0531  WBC 5.1 3.7* 5.7 7.0 7.9  NEUTROABS 3.0  --   --   --   --   HGB 8.1* 8.8* 8.4* 9.0* 9.1*  HCT 26.9* 28.9* 27.6* 30.2* 30.5*  MCV 106.3* 102.8* 102.6* 104.5* 103.4*  PLT 240 247 253 276 767   Basic Metabolic Panel: Recent Labs  Lab 07/11/21 1325 07/12/21 0526 07/13/21 0545 07/14/21 0446 07/15/21 0531  NA 137 135 136 137 138  K 5.0 4.9 5.4* 5.4* 4.7  CL 108 107 108 109 105  CO2  23 21* 23 25 27   GLUCOSE 102* 171* 164* 148* 96  BUN 45* 45* 52* 55* 65*  CREATININE 2.28* 2.35* 2.63* 2.65* 2.59*  CALCIUM 8.5* 8.4* 8.6* 8.3* 8.4*  MG  --   --   --  1.9  --    GFR: Estimated Creatinine Clearance: 39.1 mL/min (A) (by C-G formula based on SCr of 2.59 mg/dL (H)). Liver Function Tests: Recent Labs  Lab 07/12/21 1520  AST 18  ALT 26  ALKPHOS 55  BILITOT 0.7  PROT 5.6*  ALBUMIN 2.2*   No results for input(s): LIPASE, AMYLASE in the last 168 hours. No results for input(s): AMMONIA in the last 168 hours. Coagulation Profile: No results for input(s): INR, PROTIME in the last 168 hours. Cardiac Enzymes: No results for input(s): CKTOTAL, CKMB, CKMBINDEX, TROPONINI in the last 168 hours. BNP (last 3 results) No results for input(s): PROBNP in the last 8760 hours.  HbA1C: No results for input(s): HGBA1C in the last 72 hours. CBG: No results for input(s): GLUCAP in the last 168 hours. Lipid Profile: No results for input(s): CHOL, HDL, LDLCALC, TRIG, CHOLHDL, LDLDIRECT in the last 72 hours. Thyroid Function Tests: No results for input(s): TSH, T4TOTAL, FREET4, T3FREE, THYROIDAB in the last 72 hours. Anemia Panel: No results for input(s): VITAMINB12, FOLATE, FERRITIN, TIBC, IRON, RETICCTPCT in the last 72 hours.  Sepsis Labs: No results for input(s): PROCALCITON, LATICACIDVEN in the last 168 hours.  Recent Results (from the past 240 hour(s))  Resp Panel by RT-PCR (Flu A&B, Covid) Nasopharyngeal Swab     Status: None   Collection Time: 07/11/21  6:45 PM   Specimen: Nasopharyngeal Swab; Nasopharyngeal(NP) swabs in vial transport medium  Result Value Ref Range Status   SARS Coronavirus 2 by RT PCR NEGATIVE NEGATIVE Final    Comment: (NOTE) SARS-CoV-2 target nucleic acids are NOT DETECTED.  The SARS-CoV-2 RNA is generally detectable in upper respiratory specimens during the acute phase of infection. The lowest concentration of SARS-CoV-2 viral copies this assay can  detect is 138 copies/mL. A negative result does not preclude SARS-Cov-2 infection and should not be used as the sole basis for treatment or other patient management decisions. A negative result may occur with  improper specimen collection/handling, submission of specimen other than nasopharyngeal swab, presence of viral mutation(s) within the areas targeted by this assay, and inadequate number of viral copies(<138 copies/mL). A negative result must be combined with clinical observations, patient history, and epidemiological information. The expected result is Negative.  Fact Sheet for Patients:  EntrepreneurPulse.com.au  Fact Sheet for Healthcare Providers:  IncredibleEmployment.be  This test is no t yet approved or cleared by the [place] FDA and  has been authorized for detection and/or diagnosis of SARS-CoV-2 by FDA under an Emergency Use Authorization (EUA). This EUA will remain  in effect (meaning this test can be used) for the duration of the COVID-19 declaration under Section 564(b)(1) of the Act, 21 U.S.C.section 360bbb-3(b)(1), unless the authorization is terminated  or revoked sooner.       Influenza A by PCR NEGATIVE NEGATIVE Final   Influenza B by PCR NEGATIVE NEGATIVE Final    Comment: (NOTE) The Xpert Xpress SARS-CoV-2/FLU/RSV plus assay is intended as an aid in the diagnosis of influenza from Nasopharyngeal swab specimens and should not be used as a sole basis for treatment. Nasal washings and aspirates are unacceptable for Xpert Xpress SARS-CoV-2/FLU/RSV testing.  Fact Sheet for Patients: EntrepreneurPulse.com.au  Fact Sheet for Healthcare Providers: IncredibleEmployment.be  This test is not yet approved or cleared by the [place] FDA and has been authorized for detection and/or diagnosis of SARS-CoV-2 by FDA under an Emergency Use Authorization (EUA). This EUA will remain in  effect (meaning this test can be used) for the duration of the COVID-19 declaration under Section 564(b)(1) of the Act, 21 U.S.C. section 360bbb-3(b)(1), unless the authorization is terminated or revoked.  Performed at [facility], [address] [place], [place] [address]       Imaging Studies   No results found.   Medications   Scheduled Meds:  apixaban  5 mg Oral BID   colchicine  0.3 mg Oral Daily   diltiazem  300 mg Oral Daily   DULoxetine  60 mg Oral BID   furosemide  80 mg Intravenous BID   melatonin  5 mg Oral QHS   metoprolol tartrate  50 mg Oral BID   mirtazapine  15 mg Oral QHS   mometasone-formoterol  2 puff Inhalation BID   multivitamin with minerals  1 tablet Oral Daily   pantoprazole  40 mg Oral Daily   tamsulosin  0.4 mg Oral QPC supper   vitamin B-12  1,000 mcg Oral Daily   Continuous Infusions:     LOS: 4 days    Time spent: 25 minutes with > 50% spent at bedside and in coordination of care     Ezekiel Slocumb, DO [facility]  07/15/2021, 4:17 PM      If 7PM-7AM, please contact night-coverage. How to contact the [facility] Attending or Consulting provider [place] or covering provider during after hours [place], for this patient?    Check the care team in [facility] and look for a) attending/consulting [facility] provider listed and b) the [facility] team listed Log into [email or web address] and use [place]'s universal password to access. If you do not have the password, please contact the hospital operator. Locate the [facility] provider you are looking for under [facility] and page to a number that you can be directly reached. If you still have difficulty reaching the provider, please page the [facility] (Director on Call) for the Hospitalists listed on amion for assistance.

## 2021-07-16 ENCOUNTER — Inpatient Hospital Stay: Payer: Medicare HMO

## 2021-07-16 DIAGNOSIS — J9602 Acute respiratory failure with hypercapnia: Secondary | ICD-10-CM | POA: Diagnosis not present

## 2021-07-16 DIAGNOSIS — J9601 Acute respiratory failure with hypoxia: Secondary | ICD-10-CM | POA: Diagnosis not present

## 2021-07-16 DIAGNOSIS — I4891 Unspecified atrial fibrillation: Secondary | ICD-10-CM | POA: Diagnosis not present

## 2021-07-16 DIAGNOSIS — I5033 Acute on chronic diastolic (congestive) heart failure: Secondary | ICD-10-CM | POA: Diagnosis not present

## 2021-07-16 LAB — CBC
HCT: 28.3 % — ABNORMAL LOW (ref 39.0–52.0)
Hemoglobin: 8.8 g/dL — ABNORMAL LOW (ref 13.0–17.0)
MCH: 31.2 pg (ref 26.0–34.0)
MCHC: 31.1 g/dL (ref 30.0–36.0)
MCV: 100.4 fL — ABNORMAL HIGH (ref 80.0–100.0)
Platelets: 260 10*3/uL (ref 150–400)
RBC: 2.82 MIL/uL — ABNORMAL LOW (ref 4.22–5.81)
RDW: 14.5 % (ref 11.5–15.5)
WBC: 8.9 10*3/uL (ref 4.0–10.5)
nRBC: 0 % (ref 0.0–0.2)

## 2021-07-16 LAB — BASIC METABOLIC PANEL
Anion gap: 11 (ref 5–15)
BUN: 71 mg/dL — ABNORMAL HIGH (ref 8–23)
CO2: 27 mmol/L (ref 22–32)
Calcium: 8 mg/dL — ABNORMAL LOW (ref 8.9–10.3)
Chloride: 96 mmol/L — ABNORMAL LOW (ref 98–111)
Creatinine, Ser: 2.78 mg/dL — ABNORMAL HIGH (ref 0.61–1.24)
GFR, Estimated: 24 mL/min — ABNORMAL LOW (ref >60)
Glucose, Bld: 180 mg/dL — ABNORMAL HIGH (ref 70–99)
Potassium: 3.9 mmol/L (ref 3.5–5.1)
Sodium: 134 mmol/L — ABNORMAL LOW (ref 135–145)

## 2021-07-16 NOTE — Progress Notes (Signed)
Patient with confusion and a high falls risk.  Patient attempting to get out of bed without staff assistance.  Confusion noted when attempting to reorient patient to room and to safety measures.  Safety tele-sitter order placed. Updated Dr. Arbutus Ped about need for tele-sitter.

## 2021-07-16 NOTE — Progress Notes (Addendum)
Progress Note  Patient Name: Matthew [name] Date of Encounter: 07/16/2021  Primary Cardiologist: Nelva Bush, MD  Subjective   Breathing improved.  Feels that he has more energy.  HRs higher overnight - up to 130's this AM while sitting up and eating bfast.  Inpatient Medications    Scheduled Meds:  apixaban  5 mg Oral BID   colchicine  0.3 mg Oral Daily   diltiazem  300 mg Oral Daily   DULoxetine  60 mg Oral BID   furosemide  80 mg Intravenous BID   melatonin  5 mg Oral QHS   metoprolol tartrate  50 mg Oral BID   mirtazapine  15 mg Oral QHS   mometasone-formoterol  2 puff Inhalation BID   multivitamin with minerals  1 tablet Oral Daily   pantoprazole  40 mg Oral Daily   tamsulosin  0.4 mg Oral QPC supper   vitamin B-12  1,000 mcg Oral Daily   Continuous Infusions:  PRN Meds: acetaminophen, albuterol, allopurinol, ALPRAZolam, bisacodyl, ipratropium-albuterol, ondansetron (ZOFRAN) IV, oxyCODONE, polyethylene glycol, traZODone   Vital Signs    Vitals:   07/15/21 2350 07/16/21 0412 07/16/21 0600 07/16/21 0724  BP: 112/77 94/61  104/65  Pulse: 99 90  (!) 56  Resp: 17 18  17   Temp: 98.4 F (36.9 C) 98 F (36.7 C)  98 F (36.7 C)  TempSrc:      SpO2: 98% 97%  100%  Weight:   (!) 138.3 kg   Height:        Intake/Output Summary (Last 24 hours) at 07/16/2021 0753 Last data filed at 07/16/2021 0723 Gross per 24 hour  Intake 1080 ml  Output 3900 ml  Net -2820 ml   Filed Weights   07/14/21 0431 07/15/21 0614 07/16/21 0600  Weight: (!) 142.2 kg (!) 141.1 kg (!) 138.3 kg    Physical Exam   GEN: obese, in no acute distress.  HEENT: Grossly normal.  Neck: Supple, obese, difficult to gauge JVP due to body habitus/facial hair, no carotid bruits, or masses. Cardiac: IR, IR, tachy, no murmurs, rubs, or gallops. No clubbing, cyanosis, 1+ bilat lower leg edema.  Radials 2+, DP/PT 1+ and equal bilaterally.  Respiratory:  Respirations regular and unlabored, clear to  auscultation bilaterally. GI: Obese, soft, nontender, nondistended, BS + x 4. MS: no deformity or atrophy. Skin: warm and dry, no rash. Neuro:  Strength and sensation are intact. Psych: AAOx3.  Normal affect.  Labs    Chemistry Recent Labs  Lab 07/12/21 1520 07/13/21 0545 07/14/21 0446 07/15/21 0531 07/16/21 0540  NA  --    < > 137 138 134*  K  --    < > 5.4* 4.7 3.9  CL  --    < > 109 105 96*  CO2  --    < > 25 27 27   GLUCOSE  --    < > 148* 96 180*  BUN  --    < > 55* 65* 71*  CREATININE  --    < > 2.65* 2.59* 2.78*  CALCIUM  --    < > 8.3* 8.4* 8.0*  PROT 5.6*  --   --   --   --   ALBUMIN 2.2*  --   --   --   --   AST 18  --   --   --   --   ALT 26  --   --   --   --   [facility]  55  --   --   --   --   BILITOT 0.7  --   --   --   --   GFRNONAA  --    < > 25* 26* 24*  ANIONGAP  --    < > 3* 6 11   < > = values in this interval not displayed.     Hematology Recent Labs  Lab 07/14/21 0446 07/15/21 0531 07/16/21 0540  WBC 7.0 7.9 8.9  RBC 2.89* 2.95* 2.82*  HGB 9.0* 9.1* 8.8*  HCT 30.2* 30.5* 28.3*  MCV 104.5* 103.4* 100.4*  MCH 31.1 30.8 31.2  MCHC 29.8* 29.8* 31.1  RDW 14.6 14.6 14.5  PLT 276 270 260    Cardiac Enzymes  Recent Labs  Lab 07/03/21 1541 07/04/21 0005 07/11/21 1325 07/11/21 1526  TROPONINIHS 14 11 12 11       BNP Recent Labs  Lab 07/11/21 1503  BNP 95.6     Lipids  Lab Results  Component Value Date   CHOL 101 06/13/2021   HDL 40 (L) 06/13/2021   LDLCALC 47 06/13/2021   TRIG 69 06/13/2021   CHOLHDL 2.5 06/13/2021    HbA1c  Lab Results  Component Value Date   HGBA1C 5.9 (H) 06/13/2021    Radiology    No results found.  Telemetry    Afib, 90's to 130's - Personally Reviewed  Cardiac Studies   Echo 06/13/21  1. AF with RVR precludes accurate EF assessment. Left ventricular  ejection fraction, by estimation, is 50 to 55%. The left ventricle has low  normal function. The left ventricle has no regional wall motion   abnormalities. Left ventricular diastolic  parameters are indeterminate.   2. Right ventricular systolic function is normal. The right ventricular  size is normal.   3. Left atrial size was mildly dilated.   4. The mitral valve is normal in structure. Trivial mitral valve  regurgitation.   5. The aortic valve is normal in structure. Aortic valve regurgitation is  not visualized. No aortic stenosis is present.   6. Aortic dilatation noted. There is mild dilatation of the aortic root,  measuring 41 mm.  _____________   [facility] 10/17/2018 Conclusion     Normal left ventricular function Normal coronaries   Conclusion Normal cardiac cath via right radial No evidence coronary disease Troponin represents demand ischemia not non-STEMI  _____________     Patient Profile     70 y.o. male  with a hx of HTN, asthma, obesity, COPD, chronic respiratory failure on 2L O2 CKD stage 3, afib on Eliquis, anemia of chronic dzs, OSA not on CPAP who presented to the ED on 11/28 w/ dyspnea, fatigue, and pain in his feet, and was found to be in afib w/ rvr in the setting of progressive anemia.   Assessment & Plan    1.  Persistent atrial fibrillation: Patient presented with dyspnea on exertion and fatigue.  He was found to be in atrial fibrillation with rapid ventricular response.  Rates trending a little higher over the past 24 hours, 90s to low 100s with highs into the 130s when sitting up or moving around his room.  He remains on diltiazem CD 300 mg daily as well as metoprolol 50 mg twice daily.  Blood pressure has been soft in the 90s to 1 teens, limiting further titration.  He is a poor candidate for digoxin in the setting of stage III-IV chronic kidney disease with slight worsening of creatinine today.  In the setting of stable H&H, Eliquis was resumed and H&H down slightly at 8.8 and 28.3.  With drop, will discontinue Eliquis until GI evaluation completed.  2.  Acute on chronic heart failure with  preserved ejection fraction: He started experiencing worsening dyspnea and lower extremity edema as well as increasing abdominal girth about 2 weeks prior to admission.  We began diuresing him on December 1 and he has had good response.  He was -3 L yesterday and is -8.2 L for admission.  BUN and creatinine have risen to 71 and 2.78 respectively.  In that setting, I have discontinued IV Lasix today.  He still has 1+ lower extremity edema and I will place an order for compression stockings.  Blood pressure has been stable/soft while heart rates have been trending a little bit higher, potentially driven by mild dehydration.  3.  Macrocytic anemia: H&H had been stable for a few days and we resumed Eliquis.  He is lower this morning at 8.8 and 28.3.  Will hold Eliquis until he has been cleared by GI.  4.  Stage III-IV chronic kidney disease: As above, BUN and creatinine have risen with additional diuresis.  Lasix discontinued.  5.  Essential hypertension: Blood pressure soft this morning.  Follow on beta-blocker and diltiazem therapy.  6.  COPD/chronic respiratory failure: Breathing seems to be improving.  He is clear to auscultation today.  Inhalers nebulizers per medicine team.  7.  Obstructive sleep apnea: He did not wear CPAP and we discussed the importance of doing so especially in the setting of HFpEF and A. fib.  Signed, Murray Hodgkins, NP  07/16/2021, 7:53 AM    For questions or updates, please contact   Please consult [email or web address] for contact info under Cardiology/STEMI.       I have seen, examined the patient, and reviewed the above assessment and plan.  Changes to above are made where necessary.  On exam, chronically ill,  _ disheveled,  prolonged expiratory phase, iRRR  + dependant edema.  Management of AF and heart failure has been limited by comorbidities.  Prognosis is guarded at best.  Continue rate control and diuresis.  Once stable from a GI standpoint, we will consider TEE  guided [facility].  Co Sign: Thompson Grayer, MD 07/16/2021 1:07 PM

## 2021-07-16 NOTE — TOC Progression Note (Signed)
Transition of Care [facility]) - Progression Note    Patient Details  Name: Matthew [name] MRN: [id number] Date of Birth: 12/30/1950  Transition of Care [facility]) CM/SW [place], LCSW Phone Number: 07/16/2021, 11:49 AM  Clinical Narrative:   Call from [facility]. They have received insurance authorization and the earliest they can take patient is Monday. Updated assigned RNCM and Dr. Arbutus Ped.         Expected Discharge Plan and Services                                                 Social Determinants of Health (SDOH) Interventions    Readmission Risk Interventions No flowsheet data found.

## 2021-07-16 NOTE — Progress Notes (Signed)
PROGRESS NOTE    Matthew [name]   ZOX:[id number]  DOB: 17-Feb-1951  PCP: [name], MD    DOA: 07/11/2021 LOS: 5    Brief Narrative / Hospital Course to Date:   70 year old man with COPD, chronic atrial fibrillation on Eliquis, essential hypertension, chronic kidney disease stage IV.  He presents with pain in his feet and found to be in rapid atrial fibrillation, with acute anemia having a 4 g drop in hemoglobin over the past month and given a unit of blood.  Assessment & Plan   Principal Problem:   Atrial fibrillation with rapid ventricular response (HCC) Active Problems:   Stage 3b chronic kidney disease (HCC)   Obesity (BMI 30-39.9)   Acute respiratory failure (HCC)   Acute gouty arthritis   Anemia of chronic disease   Heart failure with preserved ejection fraction (HCC)   Atrial fibrillation with RVR, Persistent A-fib -  HR now controlled.   --Cardiology following, see their recs --Continue Cardizem CD 300 mg  --Continue metoprolol 25 QID per cards.  --Eliquis held due to anemia, resume and monitor  Acute on chronic diastolic CHF -Echo on 81/19/1478 -EF 50 to 55%, indeterminate diastolic parameters. Patient has pitting edema bilateral lower extremities spreading proximally to his trunk, abdominal distention, consistent with volume overload.  Weight is also up this admission.  Mildly improved after started on IV diuresis. Net IO Since Admission: -8,395 mL [07/16/21 1738] --Management per cardiology --Stop IV diuresis given rising Cr --Strict I/O's and daily weights --Monitor renal function electrolytes --Low-sodium diet and fluid restriction --Plan to discharge on oral Lasix  Delirium - pt has worsening confusion this afternoon 12/3. --Telesitter for safety --Delirium and fall precautions --Monitor and manage contributing issues --Avoid benzos  Headache reported AM 12/3.  Will check head CT given age and new onset.  No focal neurologic changes. --Tylenol  PRN  Symptomatic anemia - Hbg stable in 8-9's. POA with 4 pt Hbg drop in 1 month's time.   Pt denies bloody, melanotic or dark/tarry stools.   Labs reflect anemia of chronic disease.  LDH and Tbili normal.  Haptoglobin elevated 436.  Patient declines GI evaluation, agrees to check fecal occult for blood. Hbg stable and improving 7.9>>8.4>> 9.0>>9.1>>8.8 --FOBT pending - not yet sent --GI evaluation as outpatient --Resumed Eliquis, closely monitor --Will need GI evaluation if bleeding recurs  Hyperkalemia - Resolved. K 5.4 on 11/30 and 12/1, given Lokelma. --Monitor BMP.  Acute gouty arthritis - involving bilateral feet.  Pain Improved with prednisone and colchicine.  Prednisone x 3 days completed (11/28-11/30). --Continue colchicine --Monitor  Chronic kidney disease stage IV - Renal function fairly stable. Cr increased 2.28 up to 2.65>>2.59>>2.78 after diuresis.  Suspect cardiorenal syndrome. --Off Lasix --Monitor BMP  Vitamin B12 deficiency - Continue oral B12  Depression - resumed on Cymbalta and Remeron.   His wife reported pt had been off his Cymbalta for a few days.  Generalized Weakness - PT evaluation.   SNF recommended for short-term rehab at d/c.  TOC following.  COPD - stable, no wheezing or signs of exacerbation. --Continue bronchodilators   Obesity: Body mass index is 39.15 kg/m.  Complicates overall care and prognosis.  Recommend lifestyle modifications including physical activity and diet for weight loss and overall long-term health.   DVT prophylaxis: Place TED hose Start: 07/16/21 1232 SCDs Start: 07/11/21 1732 apixaban (ELIQUIS) tablet 5 mg   Diet:  Diet Orders (From admission, onward)     Start  Ordered   07/11/21 1731  Diet 2 gram sodium Room service appropriate? Yes; Fluid consistency: Thin  Diet effective now       Question Answer Comment  Room service appropriate? Yes   Fluid consistency: Thin      07/11/21 1732               Code Status: Full Code   Subjective 07/16/21    Pt reports breathing feels a bit labored.  Tops of feet feel tight, denies pain.  Reports sleeping better once he falls asleep.  No other acute complaints.  This afternoon, notified pt requiring tele sitter for safety, attempting to get out of bed without staff assistance.   Disposition Plan & Communication   Status is: Inpatient  Remains inpatient appropriate because: Ongoing IV diuresis.  Pending SNF placement, requires short-term rehab at d/c.     Consults, Procedures, Significant Events   Consultants:  Cardiology  Procedures:  None  Antimicrobials:  Anti-infectives (From admission, onward)    None         Micro    Objective   Vitals:   07/16/21 0600 07/16/21 0724 07/16/21 1038 07/16/21 1501  BP:  104/65 112/67 124/88  Pulse:  (!) 56 100 93  Resp:  17 17 17   Temp:  98 F (36.7 C)  98 F (36.7 C)  TempSrc:      SpO2:  100%  96%  Weight: (!) 138.3 kg     Height:        Intake/Output Summary (Last 24 hours) at 07/16/2021 1738 Last data filed at 07/16/2021 1503 Gross per 24 hour  Intake 1560 ml  Output 3100 ml  Net -1540 ml   Filed Weights   07/14/21 0431 07/15/21 0614 07/16/21 0600  Weight: (!) 142.2 kg (!) 141.1 kg (!) 138.3 kg    Physical Exam:  General exam: awake, alert, no acute distress, obese Respiratory system: Lungs clear diminished bases, no wheezes or rhonchi, normal respiratory effort at rest, on 2 L minute nasal cannula O2 Cardiovascular system: normal S1/S2, RRR, persistent lower extremity and pedal pitting edema 3+ Central nervous system: A&O x3.  Normal speech, grossly nonfocal exam Psychiatry: Normal mood, congruent affect  Labs   Data Reviewed: I have personally reviewed following labs and imaging studies  CBC: Recent Labs  Lab 07/11/21 1325 07/12/21 0526 07/13/21 0545 07/14/21 0446 07/15/21 0531 07/16/21 0540  WBC 5.1 3.7* 5.7 7.0 7.9 8.9  NEUTROABS 3.0  --   --   --    --   --   HGB 8.1* 8.8* 8.4* 9.0* 9.1* 8.8*  HCT 26.9* 28.9* 27.6* 30.2* 30.5* 28.3*  MCV 106.3* 102.8* 102.6* 104.5* 103.4* 100.4*  PLT 240 247 253 276 270 510   Basic Metabolic Panel: Recent Labs  Lab 07/12/21 0526 07/13/21 0545 07/14/21 0446 07/15/21 0531 07/16/21 0540  NA 135 136 137 138 134*  K 4.9 5.4* 5.4* 4.7 3.9  CL 107 108 109 105 96*  CO2 21* 23 25 27 27   GLUCOSE 171* 164* 148* 96 180*  BUN 45* 52* 55* 65* 71*  CREATININE 2.35* 2.63* 2.65* 2.59* 2.78*  CALCIUM 8.4* 8.6* 8.3* 8.4* 8.0*  MG  --   --  1.9  --   --    GFR: Estimated Creatinine Clearance: 36.1 mL/min (A) (by C-G formula based on SCr of 2.78 mg/dL (H)). Liver Function Tests: Recent Labs  Lab 07/12/21 1520  AST 18  ALT 26  ALKPHOS 55  BILITOT  0.7  PROT 5.6*  ALBUMIN 2.2*   No results for input(s): LIPASE, AMYLASE in the last 168 hours. No results for input(s): AMMONIA in the last 168 hours. Coagulation Profile: No results for input(s): INR, PROTIME in the last 168 hours. Cardiac Enzymes: No results for input(s): CKTOTAL, CKMB, CKMBINDEX, TROPONINI in the last 168 hours. BNP (last 3 results) No results for input(s): PROBNP in the last 8760 hours. HbA1C: No results for input(s): HGBA1C in the last 72 hours. CBG: No results for input(s): GLUCAP in the last 168 hours. Lipid Profile: No results for input(s): CHOL, HDL, LDLCALC, TRIG, CHOLHDL, LDLDIRECT in the last 72 hours. Thyroid Function Tests: No results for input(s): TSH, T4TOTAL, FREET4, T3FREE, THYROIDAB in the last 72 hours. Anemia Panel: No results for input(s): VITAMINB12, FOLATE, FERRITIN, TIBC, IRON, RETICCTPCT in the last 72 hours.  Sepsis Labs: No results for input(s): PROCALCITON, LATICACIDVEN in the last 168 hours.  Recent Results (from the past 240 hour(s))  Resp Panel by RT-PCR (Flu A&B, Covid) Nasopharyngeal Swab     Status: None   Collection Time: 07/11/21  6:45 PM   Specimen: Nasopharyngeal Swab; Nasopharyngeal(NP)  swabs in vial transport medium  Result Value Ref Range Status   SARS Coronavirus 2 by RT PCR NEGATIVE NEGATIVE Final    Comment: (NOTE) SARS-CoV-2 target nucleic acids are NOT DETECTED.  The SARS-CoV-2 RNA is generally detectable in upper respiratory specimens during the acute phase of infection. The lowest concentration of SARS-CoV-2 viral copies this assay can detect is 138 copies/mL. A negative result does not preclude SARS-Cov-2 infection and should not be used as the sole basis for treatment or other patient management decisions. A negative result may occur with  improper specimen collection/handling, submission of specimen other than nasopharyngeal swab, presence of viral mutation(s) within the areas targeted by this assay, and inadequate number of viral copies(<138 copies/mL). A negative result must be combined with clinical observations, patient history, and epidemiological information. The expected result is Negative.  Fact Sheet for Patients:  EntrepreneurPulse.com.au  Fact Sheet for Healthcare Providers:  IncredibleEmployment.be  This test is no t yet approved or cleared by the [place] FDA and  has been authorized for detection and/or diagnosis of SARS-CoV-2 by FDA under an Emergency Use Authorization (EUA). This EUA will remain  in effect (meaning this test can be used) for the duration of the COVID-19 declaration under Section 564(b)(1) of the Act, 21 U.S.C.section 360bbb-3(b)(1), unless the authorization is terminated  or revoked sooner.       Influenza A by PCR NEGATIVE NEGATIVE Final   Influenza B by PCR NEGATIVE NEGATIVE Final    Comment: (NOTE) The Xpert Xpress SARS-CoV-2/FLU/RSV plus assay is intended as an aid in the diagnosis of influenza from Nasopharyngeal swab specimens and should not be used as a sole basis for treatment. Nasal washings and aspirates are unacceptable for Xpert Xpress  SARS-CoV-2/FLU/RSV testing.  Fact Sheet for Patients: EntrepreneurPulse.com.au  Fact Sheet for Healthcare Providers: IncredibleEmployment.be  This test is not yet approved or cleared by the [place] FDA and has been authorized for detection and/or diagnosis of SARS-CoV-2 by FDA under an Emergency Use Authorization (EUA). This EUA will remain in effect (meaning this test can be used) for the duration of the COVID-19 declaration under Section 564(b)(1) of the Act, 21 U.S.C. section 360bbb-3(b)(1), unless the authorization is terminated or revoked.  Performed at [facility], [address], [place], [place] [address]       Imaging Studies  CT HEAD WO CONTRAST (5MM)  Result Date: 07/16/2021 CLINICAL DATA:  Headache, new or worsening (Age >= 50y) EXAM: CT HEAD WITHOUT CONTRAST TECHNIQUE: Contiguous axial images were obtained from the base of the skull through the vertex without intravenous contrast. COMPARISON:  None. FINDINGS: Brain: There is no acute intracranial hemorrhage, mass effect, or edema. Gray-Ayotte differentiation is preserved. There is no extra-axial fluid collection. Ventricles and sulci are within normal limits in size and configuration. Patchy hypoattenuation in the supratentorial Polack matter is nonspecific but may reflect mild to moderate chronic microvascular ischemic changes. Vascular: There is atherosclerotic calcification at the skull base. Skull: Calvarium is unremarkable. Sinuses/Orbits: No acute finding. Other: Bilateral mastoid effusions.  Right middle ear effusion. IMPRESSION: No acute intracranial abnormality. Chronic microvascular ischemic changes. Electronically Signed   By: Macy Mis M.D.   On: 07/16/2021 13:28     Medications   Scheduled Meds:  apixaban  5 mg Oral BID   colchicine  0.3 mg Oral Daily   diltiazem  300 mg Oral Daily   DULoxetine  60 mg Oral BID   melatonin  5 mg Oral QHS    metoprolol tartrate  50 mg Oral BID   mirtazapine  15 mg Oral QHS   mometasone-formoterol  2 puff Inhalation BID   multivitamin with minerals  1 tablet Oral Daily   pantoprazole  40 mg Oral Daily   tamsulosin  0.4 mg Oral QPC supper   vitamin B-12  1,000 mcg Oral Daily   Continuous Infusions:     LOS: 5 days    Time spent: 25 minutes with > 50% spent at bedside and in coordination of care     Ezekiel Slocumb, DO [facility]  07/16/2021, 5:38 PM      If 7PM-7AM, please contact night-coverage. How to contact the [facility] Attending or Consulting provider [place] or covering provider during after hours [place], for this patient?    Check the care team in [facility] and look for a) attending/consulting [facility] provider listed and b) the [facility] team listed Log into [email or web address] and use [place]'s universal password to access. If you do not have the password, please contact the hospital operator. Locate the [facility] provider you are looking for under [facility] and page to a number that you can be directly reached. If you still have difficulty reaching the provider, please page the [facility] (Director on Call) for the Hospitalists listed on amion for assistance.

## 2021-07-17 DIAGNOSIS — I5033 Acute on chronic diastolic (congestive) heart failure: Secondary | ICD-10-CM | POA: Diagnosis not present

## 2021-07-17 DIAGNOSIS — E875 Hyperkalemia: Secondary | ICD-10-CM

## 2021-07-17 DIAGNOSIS — R410 Disorientation, unspecified: Secondary | ICD-10-CM

## 2021-07-17 DIAGNOSIS — D519 Vitamin B12 deficiency anemia, unspecified: Secondary | ICD-10-CM

## 2021-07-17 DIAGNOSIS — D638 Anemia in other chronic diseases classified elsewhere: Secondary | ICD-10-CM | POA: Diagnosis not present

## 2021-07-17 DIAGNOSIS — I4891 Unspecified atrial fibrillation: Secondary | ICD-10-CM | POA: Diagnosis not present

## 2021-07-17 DIAGNOSIS — M109 Gout, unspecified: Secondary | ICD-10-CM | POA: Diagnosis not present

## 2021-07-17 LAB — BASIC METABOLIC PANEL
Anion gap: 7 (ref 5–15)
BUN: 64 mg/dL — ABNORMAL HIGH (ref 8–23)
CO2: 29 mmol/L (ref 22–32)
Calcium: 8.3 mg/dL — ABNORMAL LOW (ref 8.9–10.3)
Chloride: 98 mmol/L (ref 98–111)
Creatinine, Ser: 2.72 mg/dL — ABNORMAL HIGH (ref 0.61–1.24)
GFR, Estimated: 24 mL/min — ABNORMAL LOW (ref >60)
Glucose, Bld: 124 mg/dL — ABNORMAL HIGH (ref 70–99)
Potassium: 4.4 mmol/L (ref 3.5–5.1)
Sodium: 134 mmol/L — ABNORMAL LOW (ref 135–145)

## 2021-07-17 LAB — CBC
HCT: 29.1 % — ABNORMAL LOW (ref 39.0–52.0)
Hemoglobin: 9 g/dL — ABNORMAL LOW (ref 13.0–17.0)
MCH: 31.3 pg (ref 26.0–34.0)
MCHC: 30.9 g/dL (ref 30.0–36.0)
MCV: 101 fL — ABNORMAL HIGH (ref 80.0–100.0)
Platelets: 249 10*3/uL (ref 150–400)
RBC: 2.88 MIL/uL — ABNORMAL LOW (ref 4.22–5.81)
RDW: 14.6 % (ref 11.5–15.5)
WBC: 8.4 10*3/uL (ref 4.0–10.5)
nRBC: 0 % (ref 0.0–0.2)

## 2021-07-17 LAB — MAGNESIUM: Magnesium: 1.6 mg/dL — ABNORMAL LOW (ref 1.7–2.4)

## 2021-07-17 MED ORDER — MAGNESIUM SULFATE 4 GM/100ML IV SOLN
4.0000 g | Freq: Once | INTRAVENOUS | Status: AC
  Administered 2021-07-17: 11:00:00 4 g via INTRAVENOUS
  Filled 2021-07-17: qty 100

## 2021-07-17 NOTE — Progress Notes (Signed)
Wife at bedside, expressing concerns related to bringing the patient home due to "we needed to call the fire department to help him get up the steps to get home." She asserts that the patient does live with her and they do have a few items such as a walker and shower chair at home, but she is worried about getting over burdened and believes the patient has already met the 21-day limit for rehab care through their insurance. Wife says if social work can reach out via phone to discuss these issues she would very much appreciate it, as the patient has been intermittently confused/forgetful this admit. Wife name is [name], phone number is [phone].

## 2021-07-17 NOTE — Progress Notes (Signed)
PROGRESS NOTE    Matthew [name]  LFY:[id number] DOB: [birth year] DOA: 07/11/2021 PCP: [name], MD    Chief Complaint  Patient presents with   Shortness of Breath   Leg Swelling    Brief Narrative: 70 year old man with COPD, chronic atrial fibrillation on Eliquis, essential hypertension, chronic kidney disease stage IV.  He presents with pain in his feet and found to be in rapid atrial fibrillation, with acute anemia having a 4 g drop in hemoglobin over the past month and given a unit of blood   Assessment & Plan:   Principal Problem:   Atrial fibrillation with rapid ventricular response (HCC) Active Problems:   Stage 3b chronic kidney disease (HCC)   Obesity (BMI 30-39.9)   Acute respiratory failure (HCC)   Acute gout   Anemia of chronic disease   CKD (chronic kidney disease) stage 4, GFR 15-29 ml/min (HCC)   Heart failure with preserved ejection fraction (HCC)   Anemia due to vitamin B12 deficiency   Delirium   Hyperkalemia  #1 persistent atrial fibrillation with RVR -Patient still in A. fib with RVR however heart rate better controlled. -Continue rate management with Cardizem CD 300 mg daily, Lopressor 50 mg twice daily. -Patient resumed back on Eliquis with stable hemoglobin and no overt bleeding noted. -Patient being followed by cardiology and recommending consideration of TEE guided cardioversion later on this week. -Per cardiology.  2.  Acute on chronic diastolic CHF -Likely triggered by problem #1. -2D echo from 06/13/2021 with EF of 50 to 55%, indeterminate diastolic parameters. -Patient noted with some bilateral pitting edema and findings consistent with volume overload which improved initially after diuresis however due to rising creatinine IV diuretics stopped per cardiology. -Patient with urine output of 1.7 L over the past 24 hours. -Patient is -9 L during this hospitalization. -Management per cardiology.  3.  Delirium -Patient noted to have some  worsening confusion the afternoon of 07/16/2021. -Patient alert to self place and time, answering questions appropriately. -Continue telemetry sitter for safety. -Delirium, fall precautions. -Try to avoid benzos.  4.  Headache -Head CT obtained with no acute abnormalities. -No focal neurological deficits. -Tylenol as needed.  5.  Symptomatic anemia, POA -Patient noted to have a four-point hemoglobin drop over the past month. -Patient denied bloody bowel movements or dark tarry stools. -Likely anemia of chronic disease. -LDH, bilirubin within normal limits. -Haptoglobin noted at 436. -Patient declined GI evaluation however agreed to FOBT which is still pending. -We will need GI evaluation as outpatient. -Eliquis resumed, hemoglobin stable at 9.0. -If recurrent bleed or significant drop in hemoglobin will need GI evaluation, patient agrees to this.  6.  Hyperkalemia -Resolved.  7.  Acute gouty arthritis -Noted to involve bilateral feet, improved on prednisone and colchicine. -Denies any significant further bilateral foot pain. -Colchicine.  8.  Chronic kidney disease stage IV -Patient noted to have a bump in creatinine after diuresis and as such Lasix discontinued per cardiology. -Creatinine seems to be stabilizing. -Outpatient follow-up.  9.  Vitamin B12 deficiency -Continue oral vitamin B12 supplementation.  10.  Depression -It is noted that patient's wife had reported patient had been off his Cymbalta for few days. -Continue Cymbalta, Remeron.  11.  COPD -Stable. -Continue bronchodilators.  12.  Generalized weakness -PT/OT recommending short-term SNF -TOC following.  13.  Obesity -Lifestyle modifications, outpatient follow-up with PCP.    DVT prophylaxis: Eliquis Code Status: Full Family Communication: Updated patient.  No family at bedside Disposition:  Status is: Inpatient  Remains inpatient appropriate because: Severity of illness        Consultants:  Cardiology: Dr. Saunders Revel 07/12/2021  Procedures:  CT head 07/16/2021 Chest x-ray 07/11/2021 Transfusion 1 unit packed red blood cells 07/11/2021   Antimicrobials:  None   Subjective: Laying in bed.  Alert and oriented to self place and time.  No chest pain.  No shortness of breath.  No abdominal pain.  Complaining of bilateral ankle pain/discomfort states he feels like he is wearing a very tight sock.  Patient denies any overt bleeding.  Objective: Vitals:   07/17/21 0816 07/17/21 1043 07/17/21 1217 07/17/21 1645  BP: 108/70 97/76 111/76 114/74  Pulse: (!) 106 100 (!) 106 (!) 109  Resp: 18 15 18 18   Temp: 98.5 F (36.9 C)  98.3 F (36.8 C) 98 F (36.7 C)  TempSrc:      SpO2: 98% 96% 97% 95%  Weight:      Height:        Intake/Output Summary (Last 24 hours) at 07/17/2021 1809 Last data filed at 07/17/2021 1320 Gross per 24 hour  Intake 584.72 ml  Output 1200 ml  Net -615.28 ml   Filed Weights   07/15/21 0614 07/16/21 0600 07/17/21 0556  Weight: (!) 141.1 kg (!) 138.3 kg (!) 137.4 kg    Examination:  General exam: Appears calm and comfortable  Respiratory system: Clear to auscultation. Respiratory effort normal. Cardiovascular system: Irregularly irregular.  No murmurs rubs or gallops.  1-2+ bilateral lower extremity edema.  Gastrointestinal system: Abdomen is nondistended, soft and nontender. No organomegaly or masses felt. Normal bowel sounds heard. Central nervous system: Alert and oriented. No focal neurological deficits. Extremities: Symmetric 5 x 5 power. Skin: No rashes, lesions or ulcers Psychiatry: Judgement and insight appear normal. Mood & affect appropriate.     Data Reviewed: I have personally reviewed following labs and imaging studies  CBC: Recent Labs  Lab 07/11/21 1325 07/12/21 0526 07/13/21 0545 07/14/21 0446 07/15/21 0531 07/16/21 0540 07/17/21 0605  WBC 5.1   < > 5.7 7.0 7.9 8.9 8.4  NEUTROABS 3.0  --   --   --   --   --    --   HGB 8.1*   < > 8.4* 9.0* 9.1* 8.8* 9.0*  HCT 26.9*   < > 27.6* 30.2* 30.5* 28.3* 29.1*  MCV 106.3*   < > 102.6* 104.5* 103.4* 100.4* 101.0*  PLT 240   < > 253 276 270 260 249   < > = values in this interval not displayed.    Basic Metabolic Panel: Recent Labs  Lab 07/13/21 0545 07/14/21 0446 07/15/21 0531 07/16/21 0540 07/17/21 0605  NA 136 137 138 134* 134*  K 5.4* 5.4* 4.7 3.9 4.4  CL 108 109 105 96* 98  CO2 23 25 27 27 29   GLUCOSE 164* 148* 96 180* 124*  BUN 52* 55* 65* 71* 64*  CREATININE 2.63* 2.65* 2.59* 2.78* 2.72*  CALCIUM 8.6* 8.3* 8.4* 8.0* 8.3*  MG  --  1.9  --   --  1.6*    GFR: Estimated Creatinine Clearance: 36.7 mL/min (A) (by C-G formula based on SCr of 2.72 mg/dL (H)).  Liver Function Tests: Recent Labs  Lab 07/12/21 1520  AST 18  ALT 26  ALKPHOS 55  BILITOT 0.7  PROT 5.6*  ALBUMIN 2.2*    CBG: No results for input(s): GLUCAP in the last 168 hours.   Recent Results (from the past 240 hour(s))  Resp Panel by RT-PCR (Flu A&B, Covid) Nasopharyngeal Swab     Status: None   Collection Time: 07/11/21  6:45 PM   Specimen: Nasopharyngeal Swab; Nasopharyngeal(NP) swabs in vial transport medium  Result Value Ref Range Status   SARS Coronavirus 2 by RT PCR NEGATIVE NEGATIVE Final    Comment: (NOTE) SARS-CoV-2 target nucleic acids are NOT DETECTED.  The SARS-CoV-2 RNA is generally detectable in upper respiratory specimens during the acute phase of infection. The lowest concentration of SARS-CoV-2 viral copies this assay can detect is 138 copies/mL. A negative result does not preclude SARS-Cov-2 infection and should not be used as the sole basis for treatment or other patient management decisions. A negative result may occur with  improper specimen collection/handling, submission of specimen other than nasopharyngeal swab, presence of viral mutation(s) within the areas targeted by this assay, and inadequate number of viral copies(<138  copies/mL). A negative result must be combined with clinical observations, patient history, and epidemiological information. The expected result is Negative.  Fact Sheet for Patients:  EntrepreneurPulse.com.au  Fact Sheet for Healthcare Providers:  IncredibleEmployment.be  This test is no t yet approved or cleared by the [place] FDA and  has been authorized for detection and/or diagnosis of SARS-CoV-2 by FDA under an Emergency Use Authorization (EUA). This EUA will remain  in effect (meaning this test can be used) for the duration of the COVID-19 declaration under Section 564(b)(1) of the Act, 21 U.S.C.section 360bbb-3(b)(1), unless the authorization is terminated  or revoked sooner.       Influenza A by PCR NEGATIVE NEGATIVE Final   Influenza B by PCR NEGATIVE NEGATIVE Final    Comment: (NOTE) The Xpert Xpress SARS-CoV-2/FLU/RSV plus assay is intended as an aid in the diagnosis of influenza from Nasopharyngeal swab specimens and should not be used as a sole basis for treatment. Nasal washings and aspirates are unacceptable for Xpert Xpress SARS-CoV-2/FLU/RSV testing.  Fact Sheet for Patients: EntrepreneurPulse.com.au  Fact Sheet for Healthcare Providers: IncredibleEmployment.be  This test is not yet approved or cleared by the [place] FDA and has been authorized for detection and/or diagnosis of SARS-CoV-2 by FDA under an Emergency Use Authorization (EUA). This EUA will remain in effect (meaning this test can be used) for the duration of the COVID-19 declaration under Section 564(b)(1) of the Act, 21 U.S.C. section 360bbb-3(b)(1), unless the authorization is terminated or revoked.  Performed at [facility], [address], [place], [place] [address]          Radiology Studies: CT HEAD WO CONTRAST (5MM)  Result Date: 07/16/2021 CLINICAL DATA:  Headache, new or worsening  (Age >= 50y) EXAM: CT HEAD WITHOUT CONTRAST TECHNIQUE: Contiguous axial images were obtained from the base of the skull through the vertex without intravenous contrast. COMPARISON:  None. FINDINGS: Brain: There is no acute intracranial hemorrhage, mass effect, or edema. Gray-Barsch differentiation is preserved. There is no extra-axial fluid collection. Ventricles and sulci are within normal limits in size and configuration. Patchy hypoattenuation in the supratentorial Mattern matter is nonspecific but may reflect mild to moderate chronic microvascular ischemic changes. Vascular: There is atherosclerotic calcification at the skull base. Skull: Calvarium is unremarkable. Sinuses/Orbits: No acute finding. Other: Bilateral mastoid effusions.  Right middle ear effusion. IMPRESSION: No acute intracranial abnormality. Chronic microvascular ischemic changes. Electronically Signed   By: Macy Mis M.D.   On: 07/16/2021 13:28        Scheduled Meds:  apixaban  5 mg Oral BID   colchicine  0.3 mg Oral Daily   diltiazem  300 mg Oral Daily   DULoxetine  60 mg Oral BID   melatonin  5 mg Oral QHS   metoprolol tartrate  50 mg Oral BID   mirtazapine  15 mg Oral QHS   mometasone-formoterol  2 puff Inhalation BID   multivitamin with minerals  1 tablet Oral Daily   pantoprazole  40 mg Oral Daily   tamsulosin  0.4 mg Oral QPC supper   vitamin B-12  1,000 mcg Oral Daily   Continuous Infusions:     LOS: 6 days    Time spent: 40 mins    Irine Seal, MD [facility]   To contact the attending provider between 7A-7P or the covering provider during after hours 7P-7A, please log into the web site [email or web address] and access using universal [place] password for that web site. If you do not have the password, please call the hospital operator.  07/17/2021, 6:09 PM

## 2021-07-17 NOTE — Progress Notes (Signed)
Progress Note   Subjective   Doing well today, the patient denies CP or SOB.  No new concerns  Inpatient Medications    Scheduled Meds:  apixaban  5 mg Oral BID   colchicine  0.3 mg Oral Daily   diltiazem  300 mg Oral Daily   DULoxetine  60 mg Oral BID   melatonin  5 mg Oral QHS   metoprolol tartrate  50 mg Oral BID   mirtazapine  15 mg Oral QHS   mometasone-formoterol  2 puff Inhalation BID   multivitamin with minerals  1 tablet Oral Daily   pantoprazole  40 mg Oral Daily   tamsulosin  0.4 mg Oral QPC supper   vitamin B-12  1,000 mcg Oral Daily   Continuous Infusions:  PRN Meds: acetaminophen, albuterol, allopurinol, bisacodyl, ipratropium-albuterol, ondansetron (ZOFRAN) IV, oxyCODONE, polyethylene glycol, traZODone   Vital Signs    Vitals:   07/17/21 0556 07/17/21 0816 07/17/21 1043 07/17/21 1217  BP:  108/70 97/76 111/76  Pulse:  (!) 106 100 (!) 106  Resp:  18 15 18   Temp:  98.5 F (36.9 C)  98.3 F (36.8 C)  TempSrc:      SpO2:  98% 96% 97%  Weight: (!) 137.4 kg     Height:        Intake/Output Summary (Last 24 hours) at 07/17/2021 1415 Last data filed at 07/17/2021 1000 Gross per 24 hour  Intake 720 ml  Output 1200 ml  Net -480 ml   Filed Weights   07/15/21 0614 07/16/21 0600 07/17/21 0556  Weight: (!) 141.1 kg (!) 138.3 kg (!) 137.4 kg    Telemetry    Afib,  V rates today 90s,  some rates up to 120s earlier - Personally Reviewed  Physical Exam   GEN- The patient is chronically ill appearing, alert and oriented x 3 today.   Head- normocephalic, atraumatic Eyes-  Sclera clear, conjunctiva pink Ears- hearing intact Oropharynx- clear Neck- supple, Lungs-  normal work of breathing Heart- irregular rate and rhythm  GI- soft  Extremities- no clubbing, cyanosis, + edema  MS- diffuse atrophy Skin- no rash or lesion Psych- euthymic mood, full affect Neuro- strength and sensation are intact   Labs    Chemistry Recent Labs  Lab  07/12/21 1520 07/13/21 0545 07/15/21 0531 07/16/21 0540 07/17/21 0605  NA  --    < > 138 134* 134*  K  --    < > 4.7 3.9 4.4  CL  --    < > 105 96* 98  CO2  --    < > 27 27 29   GLUCOSE  --    < > 96 180* 124*  BUN  --    < > 65* 71* 64*  CREATININE  --    < > 2.59* 2.78* 2.72*  CALCIUM  --    < > 8.4* 8.0* 8.3*  PROT 5.6*  --   --   --   --   ALBUMIN 2.2*  --   --   --   --   AST 18  --   --   --   --   ALT 26  --   --   --   --   ALKPHOS 55  --   --   --   --   BILITOT 0.7  --   --   --   --   GFRNONAA  --    < > 26* 24* 24*  ANIONGAP  --    < > 6 11 7    < > = values in this interval not displayed.     Hematology Recent Labs  Lab 07/15/21 0531 07/16/21 0540 07/17/21 0605  WBC 7.9 8.9 8.4  RBC 2.95* 2.82* 2.88*  HGB 9.1* 8.8* 9.0*  HCT 30.5* 28.3* 29.1*  MCV 103.4* 100.4* 101.0*  MCH 30.8 31.2 31.3  MCHC 29.8* 31.1 30.9  RDW 14.6 14.5 14.6  PLT 270 260 249     Patient ID  70 y.o. male  with a hx of HTN, asthma, obesity, COPD, chronic respiratory failure on 2L O2 CKD stage 3, afib on Eliquis, anemia of chronic dzs, OSA not on CPAP who presented to the ED on 11/28 w/ dyspnea, fatigue, and pain in his feet, and was found to be in afib w/ rvr in the setting of progressive anemia.   Assessment & Plan    1.  Persistent atrial fibrillation Rates are reasonably controlled.  Renal failure, GI bleeding and comorbidities limited options. Appears to be tolerating eliquis at this time Consider TEE guided cardioversion later this week.  2. Acute on chronic diastolic dysfunction Volume overloaded on exam Diuresis limited by renal failure  3. Anemia GI following Now back on eliquis  4. Renal failure Per primary team  5 COPD Per primary team  Thompson Grayer MD, [facility] 07/17/2021 2:15 PM

## 2021-07-18 DIAGNOSIS — I4891 Unspecified atrial fibrillation: Secondary | ICD-10-CM | POA: Diagnosis not present

## 2021-07-18 DIAGNOSIS — N400 Enlarged prostate without lower urinary tract symptoms: Secondary | ICD-10-CM | POA: Diagnosis not present

## 2021-07-18 DIAGNOSIS — F3341 Major depressive disorder, recurrent, in partial remission: Secondary | ICD-10-CM | POA: Diagnosis not present

## 2021-07-18 DIAGNOSIS — I5032 Chronic diastolic (congestive) heart failure: Secondary | ICD-10-CM | POA: Diagnosis not present

## 2021-07-18 DIAGNOSIS — I5031 Acute diastolic (congestive) heart failure: Secondary | ICD-10-CM | POA: Diagnosis not present

## 2021-07-18 DIAGNOSIS — Z20822 Contact with and (suspected) exposure to covid-19: Secondary | ICD-10-CM | POA: Diagnosis not present

## 2021-07-18 DIAGNOSIS — I130 Hypertensive heart and chronic kidney disease with heart failure and stage 1 through stage 4 chronic kidney disease, or unspecified chronic kidney disease: Secondary | ICD-10-CM | POA: Diagnosis not present

## 2021-07-18 DIAGNOSIS — G4733 Obstructive sleep apnea (adult) (pediatric): Secondary | ICD-10-CM | POA: Diagnosis not present

## 2021-07-18 DIAGNOSIS — Z96643 Presence of artificial hip joint, bilateral: Secondary | ICD-10-CM | POA: Diagnosis not present

## 2021-07-18 DIAGNOSIS — M109 Gout, unspecified: Secondary | ICD-10-CM | POA: Diagnosis not present

## 2021-07-18 DIAGNOSIS — R4182 Altered mental status, unspecified: Secondary | ICD-10-CM | POA: Diagnosis not present

## 2021-07-18 DIAGNOSIS — G894 Chronic pain syndrome: Secondary | ICD-10-CM | POA: Diagnosis not present

## 2021-07-18 DIAGNOSIS — Z7901 Long term (current) use of anticoagulants: Secondary | ICD-10-CM | POA: Diagnosis not present

## 2021-07-18 DIAGNOSIS — I2510 Atherosclerotic heart disease of native coronary artery without angina pectoris: Secondary | ICD-10-CM | POA: Diagnosis not present

## 2021-07-18 DIAGNOSIS — J449 Chronic obstructive pulmonary disease, unspecified: Secondary | ICD-10-CM | POA: Diagnosis not present

## 2021-07-18 DIAGNOSIS — I10 Essential (primary) hypertension: Secondary | ICD-10-CM | POA: Diagnosis not present

## 2021-07-18 DIAGNOSIS — N184 Chronic kidney disease, stage 4 (severe): Secondary | ICD-10-CM | POA: Diagnosis not present

## 2021-07-18 DIAGNOSIS — I110 Hypertensive heart disease with heart failure: Secondary | ICD-10-CM | POA: Diagnosis not present

## 2021-07-18 DIAGNOSIS — R000 Tachycardia, unspecified: Secondary | ICD-10-CM | POA: Diagnosis not present

## 2021-07-18 DIAGNOSIS — I482 Chronic atrial fibrillation, unspecified: Secondary | ICD-10-CM | POA: Diagnosis not present

## 2021-07-18 DIAGNOSIS — K5909 Other constipation: Secondary | ICD-10-CM | POA: Diagnosis not present

## 2021-07-18 DIAGNOSIS — F419 Anxiety disorder, unspecified: Secondary | ICD-10-CM | POA: Diagnosis not present

## 2021-07-18 DIAGNOSIS — N189 Chronic kidney disease, unspecified: Secondary | ICD-10-CM | POA: Diagnosis not present

## 2021-07-18 DIAGNOSIS — I509 Heart failure, unspecified: Secondary | ICD-10-CM | POA: Diagnosis not present

## 2021-07-18 DIAGNOSIS — D649 Anemia, unspecified: Secondary | ICD-10-CM | POA: Diagnosis not present

## 2021-07-18 DIAGNOSIS — R531 Weakness: Secondary | ICD-10-CM | POA: Diagnosis not present

## 2021-07-18 DIAGNOSIS — J9601 Acute respiratory failure with hypoxia: Secondary | ICD-10-CM | POA: Diagnosis not present

## 2021-07-18 DIAGNOSIS — R0602 Shortness of breath: Secondary | ICD-10-CM | POA: Diagnosis not present

## 2021-07-18 DIAGNOSIS — S2231XA Fracture of one rib, right side, initial encounter for closed fracture: Secondary | ICD-10-CM | POA: Diagnosis not present

## 2021-07-18 DIAGNOSIS — Z79899 Other long term (current) drug therapy: Secondary | ICD-10-CM | POA: Diagnosis not present

## 2021-07-18 DIAGNOSIS — E46 Unspecified protein-calorie malnutrition: Secondary | ICD-10-CM | POA: Diagnosis not present

## 2021-07-18 DIAGNOSIS — I959 Hypotension, unspecified: Secondary | ICD-10-CM | POA: Diagnosis not present

## 2021-07-18 DIAGNOSIS — I480 Paroxysmal atrial fibrillation: Secondary | ICD-10-CM | POA: Diagnosis not present

## 2021-07-18 DIAGNOSIS — M810 Age-related osteoporosis without current pathological fracture: Secondary | ICD-10-CM | POA: Diagnosis not present

## 2021-07-18 DIAGNOSIS — G4730 Sleep apnea, unspecified: Secondary | ICD-10-CM | POA: Diagnosis not present

## 2021-07-18 DIAGNOSIS — J4541 Moderate persistent asthma with (acute) exacerbation: Secondary | ICD-10-CM | POA: Diagnosis not present

## 2021-07-18 DIAGNOSIS — D638 Anemia in other chronic diseases classified elsewhere: Secondary | ICD-10-CM | POA: Diagnosis not present

## 2021-07-18 DIAGNOSIS — R634 Abnormal weight loss: Secondary | ICD-10-CM | POA: Diagnosis not present

## 2021-07-18 DIAGNOSIS — J4540 Moderate persistent asthma, uncomplicated: Secondary | ICD-10-CM | POA: Diagnosis not present

## 2021-07-18 DIAGNOSIS — K5900 Constipation, unspecified: Secondary | ICD-10-CM | POA: Diagnosis not present

## 2021-07-18 DIAGNOSIS — N179 Acute kidney failure, unspecified: Secondary | ICD-10-CM | POA: Diagnosis not present

## 2021-07-18 DIAGNOSIS — G9341 Metabolic encephalopathy: Secondary | ICD-10-CM | POA: Diagnosis not present

## 2021-07-18 DIAGNOSIS — R404 Transient alteration of awareness: Secondary | ICD-10-CM | POA: Diagnosis not present

## 2021-07-18 DIAGNOSIS — Z7401 Bed confinement status: Secondary | ICD-10-CM | POA: Diagnosis not present

## 2021-07-18 DIAGNOSIS — J441 Chronic obstructive pulmonary disease with (acute) exacerbation: Secondary | ICD-10-CM | POA: Diagnosis not present

## 2021-07-18 DIAGNOSIS — M6281 Muscle weakness (generalized): Secondary | ICD-10-CM | POA: Diagnosis not present

## 2021-07-18 DIAGNOSIS — I5033 Acute on chronic diastolic (congestive) heart failure: Secondary | ICD-10-CM | POA: Diagnosis not present

## 2021-07-18 DIAGNOSIS — N390 Urinary tract infection, site not specified: Secondary | ICD-10-CM | POA: Diagnosis not present

## 2021-07-18 DIAGNOSIS — E875 Hyperkalemia: Secondary | ICD-10-CM | POA: Diagnosis not present

## 2021-07-18 DIAGNOSIS — R069 Unspecified abnormalities of breathing: Secondary | ICD-10-CM | POA: Diagnosis not present

## 2021-07-18 DIAGNOSIS — R5381 Other malaise: Secondary | ICD-10-CM | POA: Diagnosis not present

## 2021-07-18 DIAGNOSIS — J45901 Unspecified asthma with (acute) exacerbation: Secondary | ICD-10-CM | POA: Diagnosis not present

## 2021-07-18 DIAGNOSIS — F32A Depression, unspecified: Secondary | ICD-10-CM | POA: Diagnosis not present

## 2021-07-18 LAB — RESP PANEL BY RT-PCR (FLU A&B, COVID) ARPGX2
Influenza A by PCR: NEGATIVE
Influenza B by PCR: NEGATIVE
SARS Coronavirus 2 by RT PCR: NEGATIVE

## 2021-07-18 LAB — CBC WITH DIFFERENTIAL/PLATELET
Abs Immature Granulocytes: 0.32 10*3/uL — ABNORMAL HIGH (ref 0.00–0.07)
Basophils Absolute: 0 10*3/uL (ref 0.0–0.1)
Basophils Relative: 0 %
Eosinophils Absolute: 0 10*3/uL (ref 0.0–0.5)
Eosinophils Relative: 0 %
HCT: 29.7 % — ABNORMAL LOW (ref 39.0–52.0)
Hemoglobin: 9.1 g/dL — ABNORMAL LOW (ref 13.0–17.0)
Immature Granulocytes: 4 %
Lymphocytes Relative: 14 %
Lymphs Abs: 1 10*3/uL (ref 0.7–4.0)
MCH: 31 pg (ref 26.0–34.0)
MCHC: 30.6 g/dL (ref 30.0–36.0)
MCV: 101 fL — ABNORMAL HIGH (ref 80.0–100.0)
Monocytes Absolute: 0.6 10*3/uL (ref 0.1–1.0)
Monocytes Relative: 7 %
Neutro Abs: 5.7 10*3/uL (ref 1.7–7.7)
Neutrophils Relative %: 75 %
Platelets: 231 10*3/uL (ref 150–400)
RBC: 2.94 MIL/uL — ABNORMAL LOW (ref 4.22–5.81)
RDW: 14.6 % (ref 11.5–15.5)
WBC: 7.6 10*3/uL (ref 4.0–10.5)
nRBC: 0 % (ref 0.0–0.2)

## 2021-07-18 LAB — RENAL FUNCTION PANEL
Albumin: 2.3 g/dL — ABNORMAL LOW (ref 3.5–5.0)
Anion gap: 9 (ref 5–15)
BUN: 55 mg/dL — ABNORMAL HIGH (ref 8–23)
CO2: 28 mmol/L (ref 22–32)
Calcium: 8.6 mg/dL — ABNORMAL LOW (ref 8.9–10.3)
Chloride: 98 mmol/L (ref 98–111)
Creatinine, Ser: 2.54 mg/dL — ABNORMAL HIGH (ref 0.61–1.24)
GFR, Estimated: 26 mL/min — ABNORMAL LOW (ref >60)
Glucose, Bld: 159 mg/dL — ABNORMAL HIGH (ref 70–99)
Phosphorus: 3.8 mg/dL (ref 2.5–4.6)
Potassium: 3.9 mmol/L (ref 3.5–5.1)
Sodium: 135 mmol/L (ref 135–145)

## 2021-07-18 LAB — MAGNESIUM: Magnesium: 2.3 mg/dL (ref 1.7–2.4)

## 2021-07-18 MED ORDER — CYANOCOBALAMIN 1000 MCG PO TABS
1000.0000 ug | ORAL_TABLET | Freq: Every day | ORAL | Status: DC
Start: 2021-07-19 — End: 2021-10-29

## 2021-07-18 MED ORDER — ACETAMINOPHEN 325 MG PO TABS: 650.0000 mg | ORAL_TABLET | ORAL | Status: DC | PRN

## 2021-07-18 MED ORDER — METOPROLOL TARTRATE 50 MG PO TABS: 50.0000 mg | ORAL_TABLET | Freq: Two times a day (BID) | ORAL | Status: DC

## 2021-07-18 MED ORDER — ALLOPURINOL 100 MG PO TABS: 100.0000 mg | ORAL_TABLET | Freq: Every day | ORAL | Status: DC

## 2021-07-18 MED ORDER — HYDROCODONE-ACETAMINOPHEN 5-325 MG PO TABS: 1.0000 | ORAL_TABLET | Freq: Four times a day (QID) | ORAL | 0 refills | Status: DC | PRN

## 2021-07-18 NOTE — Progress Notes (Signed)
PT Cancellation Note  Patient Details Name: Matthew [name] MRN: [id number] DOB: June 12, 1951   Cancelled Treatment:    Reason Eval/Treat Not Completed: Other (comment).  Pt notes he is in too much pain and feeling sick to attempt therapy at this time.  Pt requests to be attempted this PM for session.  Therapist will attempt to see pt at later date/time as medically appropriate.   Gwenlyn Saran, PT, DPT 07/18/21, 11:48 AM

## 2021-07-18 NOTE — TOC Transition Note (Signed)
Transition of Care [facility]) - CM/SW Discharge Note   Patient Details  Name: HUEY SCALIA MRN: [id number] Date of Birth: 09/19/1950  Transition of Care [facility]) CM/SW Contact:  Alberteen Sam, LCSW Phone Number: 07/18/2021, 3:26 PM   Clinical Narrative:     Patient will DC to: [facility] Anticipated DC date: 07/18/21 Family notified: spouse betty lvm Transport by: Johnanna Schneiders  Per MD patient ready for DC to [facility] . RN, patient, patient's family, and facility notified of DC. Discharge Summary sent to facility. RN given number for report   [phone] Room 713. DC packet on chart. Ambulance transport requested for patient.  CSW signing off.  Pricilla Riffle, LCSW    Final next level of care: Skilled Nursing Facility Barriers to Discharge: No Barriers Identified   Patient Goals and CMS Choice Patient states their goals for this hospitalization and ongoing recovery are:: to go home CMS Medicare.gov Compare Post Acute Care list provided to:: Patient Choice offered to / list presented to : Patient  Discharge Placement              Patient chooses bed at: [facility] Patient to be transferred to facility by: [facility] Name of family member notified: spouse Patient and family notified of of transfer: 07/18/21  Discharge Plan and Services                                     Social Determinants of Health (SDOH) Interventions     Readmission Risk Interventions No flowsheet data found.

## 2021-07-18 NOTE — TOC Progression Note (Addendum)
Transition of Care [facility]) - Progression Note    Patient Details  Name: MACKLIN JACQUIN MRN: [id number] Date of Birth: 1951-01-28  Transition of Care [facility]) CM/SW [place], [place] Phone Number: 07/18/2021, 10:02 AM  Clinical Narrative:      CSW has attempted to call patient's wife again, lvm. Previous messages have been left for wife and no call back.    Patient can dc to [facility], CSW attempting to update wife with no response.   RN aware and will update CSW should wife visit patient's room.   CSW tried texting phone number, it is a landline.   CSW spoke with patient who said he has not spoken to his wife today but will update her with information that he can go to [facility] today. Patient reports being agreeable to go to [facility] today for rehab.      Expected Discharge Plan and Services                                                 Social Determinants of Health (SDOH) Interventions    Readmission Risk Interventions No flowsheet data found.

## 2021-07-18 NOTE — Progress Notes (Signed)
Matthew [name] to be D/C'd to [facility] per MD order. Report called to Mikle Bosworth, Therapist, sports.  Skin clean and dry with some bruising to arms. IV catheter discontinued intact. Site without signs and symptoms of complications. Dressing and pressure applied.  An After Visit Summary was printed and given to the patient.  Patient escorted via stretcher, and D/C to [facility] via [facility].  [name]  07/18/2021 5:03 PM

## 2021-07-18 NOTE — Progress Notes (Signed)
Progress Note  Patient Name: Matthew [name] Date of Encounter: 07/18/2021  Primary Cardiologist: End  Subjective   No chest pain or palpitations. Dyspnea improved. Feels back to baseline. Documented UOP ~ 200 mL for the past 24 hours with net - 9.1 L for the admission. Weight trend 137.4 to 138 kg over the past 24 hours.   Inpatient Medications    Scheduled Meds:  apixaban  5 mg Oral BID   colchicine  0.3 mg Oral Daily   diltiazem  300 mg Oral Daily   DULoxetine  60 mg Oral BID   melatonin  5 mg Oral QHS   metoprolol tartrate  50 mg Oral BID   mirtazapine  15 mg Oral QHS   mometasone-formoterol  2 puff Inhalation BID   multivitamin with minerals  1 tablet Oral Daily   pantoprazole  40 mg Oral Daily   tamsulosin  0.4 mg Oral QPC supper   vitamin B-12  1,000 mcg Oral Daily   Continuous Infusions:  PRN Meds: acetaminophen, albuterol, allopurinol, bisacodyl, ipratropium-albuterol, ondansetron (ZOFRAN) IV, oxyCODONE, polyethylene glycol, traZODone   Vital Signs    Vitals:   07/18/21 0043 07/18/21 0333 07/18/21 0725 07/18/21 1115  BP: 108/77 108/72 103/72 96/60  Pulse: 68 93 84 85  Resp: 14  18 20   Temp: 98.6 F (37 C) 98.6 F (37 C) 97.9 F (36.6 C) 97.8 F (36.6 C)  TempSrc:  Oral    SpO2: 97% 97% 97% 96%  Weight:  (!) 138 kg    Height:        Intake/Output Summary (Last 24 hours) at 07/18/2021 1333 Last data filed at 07/18/2021 1042 Gross per 24 hour  Intake 480 ml  Output 600 ml  Net -120 ml   Filed Weights   07/16/21 0600 07/17/21 0556 07/18/21 0333  Weight: (!) 138.3 kg (!) 137.4 kg (!) 138 kg    Telemetry    Afib with ventricular rates in the 80s to 90s bpm - Personally Reviewed  ECG    No new tracings - Personally Reviewed  Physical Exam   GEN: No acute distress.   Neck: No JVD. Cardiac: IRIR, no murmurs, rubs, or gallops.  Respiratory: Clear to auscultation bilaterally.  GI: Soft, nontender, non-distended.   MS: Trace bilateral  pretibial edema; No deformity. Neuro:  Alert and oriented x 3; Nonfocal.  Psych: Normal affect.  Labs    Chemistry Recent Labs  Lab 07/12/21 1520 07/13/21 0545 07/16/21 0540 07/17/21 0605 07/18/21 0415  NA  --    < > 134* 134* 135  K  --    < > 3.9 4.4 3.9  CL  --    < > 96* 98 98  CO2  --    < > 27 29 28   GLUCOSE  --    < > 180* 124* 159*  BUN  --    < > 71* 64* 55*  CREATININE  --    < > 2.78* 2.72* 2.54*  CALCIUM  --    < > 8.0* 8.3* 8.6*  PROT 5.6*  --   --   --   --   ALBUMIN 2.2*  --   --   --  2.3*  AST 18  --   --   --   --   ALT 26  --   --   --   --   ALKPHOS 55  --   --   --   --  BILITOT 0.7  --   --   --   --   GFRNONAA  --    < > 24* 24* 26*  ANIONGAP  --    < > 11 7 9    < > = values in this interval not displayed.     Hematology Recent Labs  Lab 07/16/21 0540 07/17/21 0605 07/18/21 0415  WBC 8.9 8.4 7.6  RBC 2.82* 2.88* 2.94*  HGB 8.8* 9.0* 9.1*  HCT 28.3* 29.1* 29.7*  MCV 100.4* 101.0* 101.0*  MCH 31.2 31.3 31.0  MCHC 31.1 30.9 30.6  RDW 14.5 14.6 14.6  PLT 260 249 231    Cardiac EnzymesNo results for input(s): TROPONINI in the last 168 hours. No results for input(s): TROPIPOC in the last 168 hours.   BNP Recent Labs  Lab 07/11/21 1503  BNP 95.6     DDimer No results for input(s): DDIMER in the last 168 hours.   Radiology    No results found.  Cardiac Studies   2D echo 06/13/2021: 1. AF with RVR precludes accurate EF assessment. Left ventricular  ejection fraction, by estimation, is 50 to 55%. The left ventricle has low  normal function. The left ventricle has no regional wall motion  abnormalities. Left ventricular diastolic  parameters are indeterminate.   2. Right ventricular systolic function is normal. The right ventricular  size is normal.   3. Left atrial size was mildly dilated.   4. The mitral valve is normal in structure. Trivial mitral valve  regurgitation.   5. The aortic valve is normal in structure. Aortic  valve regurgitation is  not visualized. No aortic stenosis is present.   6. Aortic dilatation noted. There is mild dilatation of the aortic root,  measuring 41 mm. __________  LHC 10/2018 [facility]): Normal left ventricular function Normal coronaries   Conclusion Normal cardiac cath via right radial No evidence coronary disease Troponin represents demand ischemia not non-STEMI __________  2D echo 10/2018: 1. The left ventricle has hyperdynamic systolic function, with an  ejection fraction of >65%. The cavity size was normal. Left ventricular  diastolic parameters were normal No evidence of left ventricular regional  wall motion abnormalities.   2. The right ventricle has normal systolic function. The cavity was  normal. There is no increase in right ventricular wall thickness.   3. The mitral valve is normal in structure.   4. The tricuspid valve is normal in structure.   5. The aortic valve is normal in structure.   6. No evidence of left ventricular regional wall motion abnormalities.   7. The interatrial septum was not assessed.  Patient Profile     70 y.o. male with history of normal coronary arteries by LHC in 10/2018, persistent Afib, HFpEF, chronic hypoxic respiratory failure, COPD, CKD stage III-IV, macrocytic anemia, HTN, and OSA who we are seeing for acute on chronic HFpEF and persistent Afib.   Assessment & Plan    1. Acute on chronic HFpEF: -Diuresis has been limited by worsening renal failure leading Lasix to be stopped -Volume status improved -Will discuss home Lasix dose with MD -Not on MRA or SGLT2i given CKD -Cannot exclude some degree of third spacing with hypoalbuminemia  -Daily standing scale weights -Strict I/O  2. Persistent Afib: -Ventricular rates are well controlled -Options are limited with CKD and possibly GI bleed -Digoxin is not an option with underlying CKD, would like to avoid amiodarone given anticoagulation has been interrupted   -Rate control with Cardizem CD  300 mg and Lopressor 50 mg bid -Eliquis initially held due to worsening anemia with an approximate 4 gram drop in HGB over the prior month -She declined GI evaluation -Eliquis resumed 12/2, appears to be tolerating at this time -Will hold off on TEE-DCCV during this admission with recommendation for rate control at this time given he is asymptomatic -We can revisit DCCV in the outpatient setting after he has been adequately anticoagulated   3. Macrocytic anemia: -Drop in HGB of nearly 4 grams upon admission when compared to a month prior  -HGB low, though stable at 9.1 -She declined GI evaluation  -FOBT pending -Given this Eliquis was resumed on 12/2, seems to be tolerating at this time  4. Acute on CKD stage III-IV: -Lasix stopped on 12/2 due to worsening renal function -Renal function improving with the holding of Lasix  5. HTN: -Blood pressure soft, though stable in the 90s to 281V mmHg systolic  -Cardizem and Lopressor as above  6. Chronic hypoxic respiratory failure/COPD: -Per IM  7. OSA: -Does not wear CPAP -Importance discussed    For questions or updates, please contact [place] Please consult [email or web address] for contact info under Cardiology/STEMI.    Signed, Christell Faith, PA-C [place] Pager: [phone] 07/18/2021, 1:33 PM

## 2021-07-18 NOTE — Care Management Important Message (Signed)
Important Message  Patient Details  Name: Matthew [name] MRN: [id number] Date of Birth: 29-Dec-1950   Medicare Important Message Given:  Yes     Dannette Barbara 07/18/2021, 12:22 PM

## 2021-07-18 NOTE — Discharge Summary (Signed)
Physician Discharge Summary   Patient name: Matthew [name]  Admit date:     07/11/2021  Discharge date: 07/18/2021  Discharge Physician: Ezekiel Slocumb   PCP: [name], MD   Recommendations at discharge:  --Monitor for blood in stools, dark/tarry stools or other bleeding - hold Eliquis if seen and call doctor --Follow up with Cardiology in 2-4 weeks --Start using CPAP for sleep apnea as this will help to keep A-fib controlled --Re-check CBC, BMP and Mg in 1-2 weeks   Discharge Diagnoses Principal Problem:   Atrial fibrillation with rapid ventricular response ([place]) Active Problems:   Stage 3b chronic kidney disease ([place])   Obesity (BMI 30-39.9)   Acute respiratory failure (HCC)   Acute gout   Anemia of chronic disease   CKD (chronic kidney disease) stage 4, GFR 15-29 ml/min (HCC)   Heart failure with preserved ejection fraction (HCC)   Anemia due to vitamin B12 deficiency   Delirium   Hyperkalemia   Resolved Diagnoses A-fib with RVR - heart rate controlled Acute gout - flare resolved   Hospital Course   70 year old man with COPD, chronic atrial fibrillation on Eliquis, essential hypertension, chronic kidney disease stage IV.  He presents with pain in his feet and found to be in rapid atrial fibrillation, with acute anemia having a 4 g drop in hemoglobin over the past month and given a unit of blood.   Atrial fibrillation with RVR, Persistent A-fib - HR now controlled.   --Cardiology consulted - follow up in clinic in 2-4 weeks --Continue Cardizem CD 300 mg  --Continue metoprolol 25 QID per cards.  --Eliquis was held due to anemia, once resumed Hbg stable and no signs of bleeding --Continue Eliquis, hold if any signs of bleeding   Acute on chronic diastolic CHF -Echo on 41/66/0630 -EF 50 to 55%, indeterminate diastolic parameters.  Patient has pitting edema bilateral lower extremities spreading proximally to his trunk, abdominal distention, consistent with volume  overload.  Weight is also up this admission.  Mildly improved after started on IV diuresis but this was stopped due to worsened renal function. Net IO Since Admission: -8,395 mL [07/16/21 1738] --Management per cardiology --Hold diuresis due to rising Cr --Daily weights --Monitor renal function electrolytes --Low-sodium diet and fluid restriction  Delirium - Improved.  Pt had slight confusion afternoon of 12/3, possibly due to medications (had received Xanax) and mild hospital delirium, lack of sleep.  This has improved and patient at baseline mentation today. --Minimize sedating medications --Avoid benzos   Headache reported AM 12/3, resolved.   Head CT obtained given age and new onset - no acute abnormalities.   No focal neurologic changes. --Tylenol PRN   Symptomatic anemia - Hbg stable in 8-9's. POA with 4 pt Hbg drop in 1 month's time.   Pt denies bloody, melanotic or dark/tarry stools.   Labs reflect anemia of chronic disease.  LDH and Tbili normal.  Haptoglobin elevated 436.  Patient declines GI evaluation, agrees to check fecal occult for blood. Hbg stable and improving 7.9>>8.4>> 9.0...>>9.1 today --GI evaluation as outpatient as needed --Resumed Eliquis, closely monitor for bleeding --Will need GI evaluation if bleeding recurs   Hyperkalemia - Resolved with Lokelma given 12/1 for K 5.4. --Repeat BMP in 1 week.   Acute gouty arthritis - involving bilateral feet.   Pain Improved with prednisone and colchicine.   Prednisone x 3 days completed (11/28-11/30). --Continue colchicine, allopurinol --Monitor   Chronic kidney disease stage IV - Renal function  fairly stable. Cr increased 2.28 up to 2.65>>2.59>>2.78 after diuresis.   Suspect cardiorenal syndrome. --Off Lasix, to be reconsidered in outpatient follow up depending on renal function --Repeat BMP in 1 week   Vitamin B12 deficiency - Continue oral B12 supplement   Depression - resumed on Cymbalta and Remeron.    His wife reported pt had been off his Cymbalta for a few days.   Generalized Weakness - PT evaluated and recommended SNF for short-term rehab at d/c.  Pt medically stable for d/c to SNF for rehab today. --Continue intensive PT/OT   COPD - stable, no wheezing or signs of exacerbation. --Continue bronchodilators   OSA - pt does not use CPAP.   Counseled on importance of CPAP for controlling A-fib and overall health, especially cardiovascular.   Outpatient follow up.   Continue to encourage use of CPAP.  Consider nasal pillow instead of mask if claustrophobia is an issue.   Obesity: Body mass index is 39.15 kg/m.  Complicates overall care and prognosis.  Recommend lifestyle modifications including physical activity and diet for weight loss and overall long-term health.       Procedures performed: None   Condition at discharge: stable  Exam General exam: awake, alert, no acute distress, obese HEENT: moist mucus membranes, hearing grossly normal  Respiratory system: CTAB, no wheezes, rales or rhonchi, normal respiratory effort. Cardiovascular system: normal S1/S2, RRR, 2+ pitting LE edema b/l.   Gastrointestinal system: soft, non-tender abdomen Central nervous system: A&O x3. no gross focal neurologic deficits, normal speech Skin: dry, intact, normal temperature Psychiatry: normal mood, congruent affect, judgement and insight appear normal    Disposition: Skilled nursing facility  Discharge time: greater than 30 minutes.   Allergies as of 07/18/2021       Reactions   Demerol [meperidine Hcl]    Lisinopril Other (See Comments)   Hypotensive         Medication List     STOP taking these medications    aspirin 81 MG EC tablet       TAKE these medications    acetaminophen 325 MG tablet Commonly known as: TYLENOL Take 2 tablets (650 mg total) by mouth every 4 (four) hours as needed for headache or mild pain.   albuterol 108 (90 Base) MCG/ACT  inhaler Commonly known as: VENTOLIN HFA Inhale 2 puffs into the lungs every 6 (six) hours as needed for wheezing or shortness of breath.   allopurinol 100 MG tablet Commonly known as: ZYLOPRIM Take 1 tablet (100 mg total) by mouth daily.   apixaban 5 MG Tabs tablet Commonly known as: ELIQUIS Take 1 tablet (5 mg total) by mouth 2 (two) times daily.   bisacodyl 5 MG EC tablet Commonly known as: DULCOLAX Take 1 tablet (5 mg total) by mouth daily as needed for moderate constipation.   colchicine 0.6 MG tablet Take 0.5 tablets (0.3 mg total) by mouth daily.   cyanocobalamin 1000 MCG tablet Take 1 tablet (1,000 mcg total) by mouth daily. Start taking on: July 19, 2021   diltiazem 300 MG 24 hr capsule Commonly known as: CARDIZEM CD Take 1 capsule (300 mg total) by mouth daily.   DULoxetine 60 MG capsule Commonly known as: CYMBALTA Take 1 capsule (60 mg total) by mouth 2 (two) times daily.   HYDROcodone-acetaminophen 5-325 MG tablet Commonly known as: NORCO/VICODIN Take 1 tablet by mouth every 6 (six) hours as needed for moderate pain or severe pain.   ipratropium-albuterol 0.5-2.5 (3) MG/3ML Soln Commonly known  as: DUONEB Take 3 mLs by nebulization every 6 (six) hours as needed.   melatonin 5 MG Tabs Take 1 tablet (5 mg total) by mouth at bedtime.   metoprolol tartrate 50 MG tablet Commonly known as: LOPRESSOR Take 1 tablet (50 mg total) by mouth 2 (two) times daily. What changed:  medication strength how much to take   mirtazapine 15 MG tablet Commonly known as: REMERON Take 15 mg by mouth at bedtime.   mometasone-formoterol 200-5 MCG/ACT Aero Commonly known as: DULERA Inhale 2 puffs into the lungs 2 (two) times daily.   multivitamin with minerals Tabs tablet Take 1 tablet by mouth daily.   polyethylene glycol 17 g packet Commonly known as: MIRALAX / GLYCOLAX Take 17 g by mouth daily as needed for mild constipation.   tamsulosin 0.4 MG Caps  capsule Commonly known as: FLOMAX Take 1 capsule (0.4 mg total) by mouth daily after supper.        DG Chest 2 View  Result Date: 07/11/2021 CLINICAL DATA:  SOB EXAM: CHEST - 2 VIEW COMPARISON:  07/03/2021 FINDINGS: Stable cardiomediastinal contours. No new consolidation or edema. No pleural effusion or pneumothorax. The visualized skeletal structures are unremarkable. IMPRESSION: No acute process in the chest. Electronically Signed   By: Macy Mis M.D.   On: 07/11/2021 14:23   DG Chest 2 View  Result Date: 07/03/2021 CLINICAL DATA:  Shortness of breath EXAM: CHEST - 2 VIEW COMPARISON:  06/17/2021 FINDINGS: Cardiomegaly. Trace bilateral pleural effusions and or pleural thickening, unchanged. The visualized skeletal structures are unremarkable. IMPRESSION: 1.  Cardiomegaly. 2. Trace bilateral pleural effusions and or pleural thickening, unchanged. 3.  No acute appearing airspace opacity. Electronically Signed   By: Delanna Ahmadi M.D.   On: 07/03/2021 16:06   CT HEAD WO CONTRAST (5MM)  Result Date: 07/16/2021 CLINICAL DATA:  Headache, new or worsening (Age >= 50y) EXAM: CT HEAD WITHOUT CONTRAST TECHNIQUE: Contiguous axial images were obtained from the base of the skull through the vertex without intravenous contrast. COMPARISON:  None. FINDINGS: Brain: There is no acute intracranial hemorrhage, mass effect, or edema. Gray-Dorsi differentiation is preserved. There is no extra-axial fluid collection. Ventricles and sulci are within normal limits in size and configuration. Patchy hypoattenuation in the supratentorial Gehrig matter is nonspecific but may reflect mild to moderate chronic microvascular ischemic changes. Vascular: There is atherosclerotic calcification at the skull base. Skull: Calvarium is unremarkable. Sinuses/Orbits: No acute finding. Other: Bilateral mastoid effusions.  Right middle ear effusion. IMPRESSION: No acute intracranial abnormality. Chronic microvascular ischemic  changes. Electronically Signed   By: Macy Mis M.D.   On: 07/16/2021 13:28   CT Angio Chest PE W and/or Wo Contrast  Result Date: 07/03/2021 CLINICAL DATA:  Chest pain, short of breath EXAM: CT ANGIOGRAPHY CHEST WITH CONTRAST TECHNIQUE: Multidetector CT imaging of the chest was performed using the standard protocol during bolus administration of intravenous contrast. Multiplanar CT image reconstructions and MIPs were obtained to evaluate the vascular anatomy. CONTRAST:  29mL OMNIPAQUE IOHEXOL 350 MG/ML SOLN COMPARISON:  07/03/2021, 08/13/2020 FINDINGS: Cardiovascular: This is a technically adequate evaluation of the pulmonary vasculature. No filling defects or pulmonary emboli. The heart is enlarged without pericardial effusion. Moderate atherosclerosis throughout the coronary vasculature. No evidence of thoracic aortic aneurysm or dissection. Mild atherosclerosis of the aortic arch. Mediastinum/Nodes: No enlarged mediastinal, hilar, or axillary lymph nodes. Thyroid gland, trachea, and esophagus demonstrate no significant findings. Lungs/Pleura: Stable bibasilar subpleural scarring. Minimal hypoventilatory changes are seen at the lung bases.  Trace bilateral pleural effusions, right greater than left. No acute airspace disease or pneumothorax. Central airways are patent. Upper Abdomen: No acute abnormality. Musculoskeletal: No acute or destructive bony lesions. Reconstructed images demonstrate no additional findings. Review of the MIP images confirms the above findings. IMPRESSION: 1. No evidence of pulmonary embolus. 2. Cardiomegaly, with moderate coronary artery atherosclerosis. 3. Trace bilateral pleural effusions. 4.  Aortic Atherosclerosis (ICD10-I70.0). Electronically Signed   By: Randa Ngo M.D.   On: 07/03/2021 17:27   US Venous Img Lower Unilateral Left  Result Date: 07/03/2021 CLINICAL DATA:  Left thigh pain. EXAM: LEFT LOWER EXTREMITY VENOUS DOPPLER ULTRASOUND TECHNIQUE: Gray-scale  sonography with compression, as well as color and duplex ultrasound, were performed to evaluate the deep venous system(s) from the level of the common femoral vein through the popliteal and proximal calf veins. COMPARISON:  None. FINDINGS: VENOUS Normal compressibility of the common femoral, superficial femoral, and popliteal veins, as well as the visualized calf veins. Visualized portions of profunda femoral vein and great saphenous vein unremarkable. No filling defects to suggest DVT on grayscale or color Doppler imaging. Doppler waveforms show normal direction of venous flow, normal respiratory plasticity and response to augmentation. Limited views of the contralateral common femoral vein are unremarkable. OTHER None. Limitations: none IMPRESSION: Negative. Electronically Signed   By: Audie Pinto M.D.   On: 07/03/2021 16:19   Results for orders placed or performed during the hospital encounter of 07/11/21  Resp Panel by RT-PCR (Flu A&B, Covid) Nasopharyngeal Swab     Status: None   Collection Time: 07/11/21  6:45 PM   Specimen: Nasopharyngeal Swab; Nasopharyngeal(NP) swabs in vial transport medium  Result Value Ref Range Status   SARS Coronavirus 2 by RT PCR NEGATIVE NEGATIVE Final    Comment: (NOTE) SARS-CoV-2 target nucleic acids are NOT DETECTED.  The SARS-CoV-2 RNA is generally detectable in upper respiratory specimens during the acute phase of infection. The lowest concentration of SARS-CoV-2 viral copies this assay can detect is 138 copies/mL. A negative result does not preclude SARS-Cov-2 infection and should not be used as the sole basis for treatment or other patient management decisions. A negative result may occur with  improper specimen collection/handling, submission of specimen other than nasopharyngeal swab, presence of viral mutation(s) within the areas targeted by this assay, and inadequate number of viral copies(<138 copies/mL). A negative result must be combined  with clinical observations, patient history, and epidemiological information. The expected result is Negative.  Fact Sheet for Patients:  EntrepreneurPulse.com.au  Fact Sheet for Healthcare Providers:  IncredibleEmployment.be  This test is no t yet approved or cleared by the [place] FDA and  has been authorized for detection and/or diagnosis of SARS-CoV-2 by FDA under an Emergency Use Authorization (EUA). This EUA will remain  in effect (meaning this test can be used) for the duration of the COVID-19 declaration under Section 564(b)(1) of the Act, 21 U.S.C.section 360bbb-3(b)(1), unless the authorization is terminated  or revoked sooner.       Influenza A by PCR NEGATIVE NEGATIVE Final   Influenza B by PCR NEGATIVE NEGATIVE Final    Comment: (NOTE) The Xpert Xpress SARS-CoV-2/FLU/RSV plus assay is intended as an aid in the diagnosis of influenza from Nasopharyngeal swab specimens and should not be used as a sole basis for treatment. Nasal washings and aspirates are unacceptable for Xpert Xpress SARS-CoV-2/FLU/RSV testing.  Fact Sheet for Patients: EntrepreneurPulse.com.au  Fact Sheet for Healthcare Providers: IncredibleEmployment.be  This test is not yet approved or  cleared by the [place] and has been authorized for detection and/or diagnosis of SARS-CoV-2 by FDA under an Emergency Use Authorization (EUA). This EUA will remain in effect (meaning this test can be used) for the duration of the COVID-19 declaration under Section 564(b)(1) of the Act, 21 U.S.C. section 360bbb-3(b)(1), unless the authorization is terminated or revoked.  Performed at [facility], [address] [place], [place] [address]     Signed:  Ezekiel Slocumb MD.  [facility] 07/18/2021, 2:32 PM

## 2021-07-19 DIAGNOSIS — I482 Chronic atrial fibrillation, unspecified: Secondary | ICD-10-CM | POA: Diagnosis not present

## 2021-07-19 DIAGNOSIS — G894 Chronic pain syndrome: Secondary | ICD-10-CM | POA: Diagnosis not present

## 2021-07-19 DIAGNOSIS — M6281 Muscle weakness (generalized): Secondary | ICD-10-CM | POA: Diagnosis not present

## 2021-07-19 DIAGNOSIS — F32A Depression, unspecified: Secondary | ICD-10-CM | POA: Diagnosis not present

## 2021-07-19 DIAGNOSIS — M109 Gout, unspecified: Secondary | ICD-10-CM | POA: Diagnosis not present

## 2021-07-19 DIAGNOSIS — G4733 Obstructive sleep apnea (adult) (pediatric): Secondary | ICD-10-CM | POA: Diagnosis not present

## 2021-07-19 DIAGNOSIS — J449 Chronic obstructive pulmonary disease, unspecified: Secondary | ICD-10-CM | POA: Diagnosis not present

## 2021-07-19 DIAGNOSIS — D649 Anemia, unspecified: Secondary | ICD-10-CM | POA: Diagnosis not present

## 2021-07-19 DIAGNOSIS — I5032 Chronic diastolic (congestive) heart failure: Secondary | ICD-10-CM | POA: Diagnosis not present

## 2021-07-19 NOTE — Progress Notes (Signed)
Patient ID: Matthew [name], male    DOB: 07-24-1951, 70 y.o.   MRN: [id number]  HPI  Matthew [name] is a 70 y/o male with a history of atrial fibrillation, asthma, CAD, HTN, CKD, anemia, anxiety, COPD, depression, gout, osteoporosis, sleep apnea and chronic heart failure.   Echo report from 06/13/21 reviewed and showed an EF of 50-55% along with mild LAE and trivial Matthew.   LHC done 10/17/2018 showed: Normal left ventricular function Normal coronaries  Admitted 07/11/21 due to foot pain. Found to be in AF RVR along with being anemic. Cardiology consult obtained. Initially given IV lasix but then stopped due to worsening renal function. Medication for afib started. Transfused 1 unit of PRBC's. Discharged after 7 days to SNF.   Matthew [name] presents today for his initial visit with a chief complaint of moderate shortness of breath upon minimal exertion. Matthew [name] describes this as chronic in nature having been present for several years. Matthew [name] has associated fatigue, pedal edema, tinnitus, dizziness, left shoulder pain, anxiety and difficulty falling asleep along with this. Matthew [name] denies any abdominal distention, palpitations, chest pain or cough.   Not being weighed daily. Was not discharged on diuretic due to worsening CKD. Hasn't been wearing his CPAP at the facility and says that his personal CPAP at home is broke and Matthew [name] can't find anyone to fix it.   Matthew [name] admits that Matthew [name]'s a very anxious person and Matthew [name] felt less anxious in the hospital because there were "people always around" that Matthew [name] could talk to or that could help him. Understands that his anxiety then makes him feel more short of breath which in turn makes him more anxious.   Past Medical History:  Diagnosis Date   Anemia    Anxiety    Arrhythmia    atrial fibrillation   Asthma    CAD (coronary artery disease)    CHF (congestive heart failure) (HCC)    Chronic kidney disease    COPD (chronic obstructive pulmonary disease) (HCC)    Depression    Edema    Gout     Hip dislocation, bilateral ([place])    Hypertension    Osteoporosis    Sleep apnea    Past Surgical History:  Procedure Laterality Date   ABDOMINAL SURGERY     CHOLECYSTECTOMY     COLOSTOMY     COLOSTOMY TAKEDOWN     HERNIA REPAIR     LEFT HEART CATH AND CORONARY ANGIOGRAPHY N/A 10/17/2018   Procedure: LEFT HEART CATH AND CORONARY ANGIOGRAPHY with possible PCI and stent;  Surgeon: Yolonda Kida, MD;  Location: [facility];  Service: Cardiovascular;  Laterality: N/A;   ORIF FEMUR FRACTURE Left 05/19/2020   Procedure: OPEN REDUCTION INTERNAL FIXATION (ORIF) DISTAL FEMUR FRACTURE;  Surgeon: Shona Needles, MD;  Location: [facility];  Service: Orthopedics;  Laterality: Left;   TOTAL HIP ARTHROPLASTY Bilateral    Family History  Problem Relation Age of Onset   COPD Mother    Cancer Mother    Melanoma Father    Social History   Tobacco Use   Smoking status: Never   Smokeless tobacco: Never  Substance Use Topics   Alcohol use: Not Currently   Allergies  Allergen Reactions   Demerol [Meperidine Hcl]    Lisinopril Other (See Comments)    Hypotensive    Meperidine And Related    Prior to Admission medications   Medication Sig Start Date End Date Taking? Authorizing Provider  acetaminophen (TYLENOL) 325  MG tablet Take 2 tablets (650 mg total) by mouth every 4 (four) hours as needed for headache or mild pain. 07/18/21  Yes Nicole Kindred A, DO  albuterol (VENTOLIN HFA) 108 (90 Base) MCG/ACT inhaler Inhale 2 puffs into the lungs every 6 (six) hours as needed for wheezing or shortness of breath.   Yes [provider]  allopurinol (ZYLOPRIM) 100 MG tablet Take 1 tablet (100 mg total) by mouth daily. 07/18/21  Yes Ezekiel Slocumb, DO  apixaban (ELIQUIS) 5 MG TABS tablet Take 1 tablet (5 mg total) by mouth 2 (two) times daily. 07/07/21 10/05/21 Yes Enzo Bi, MD  bisacodyl (DULCOLAX) 5 MG EC tablet Take 1 tablet (5 mg total) by mouth daily as needed for moderate constipation.  06/23/21  Yes Richarda Osmond, MD  colchicine 0.6 MG tablet Take 0.5 tablets (0.3 mg total) by mouth daily. 06/24/21  Yes Richarda Osmond, MD  diltiazem (CARDIZEM CD) 300 MG 24 hr capsule Take 1 capsule (300 mg total) by mouth daily. 07/07/21 10/05/21 Yes Enzo Bi, MD  DULoxetine (CYMBALTA) 60 MG capsule Take 1 capsule (60 mg total) by mouth 2 (two) times daily. 06/15/21  Yes Masoud, Viann Shove, MD  HYDROcodone-acetaminophen (NORCO/VICODIN) 5-325 MG tablet Take 1 tablet by mouth every 6 (six) hours as needed for moderate pain or severe pain. 07/18/21 07/18/22 Yes Nicole Kindred A, DO  ipratropium-albuterol (DUONEB) 0.5-2.5 (3) MG/3ML SOLN Take 3 mLs by nebulization every 6 (six) hours as needed. 07/07/21  Yes Enzo Bi, MD  melatonin 5 MG TABS Take 1 tablet (5 mg total) by mouth at bedtime. 06/23/21  Yes Richarda Osmond, MD  metoprolol tartrate (LOPRESSOR) 50 MG tablet Take 1 tablet (50 mg total) by mouth 2 (two) times daily. 07/18/21  Yes Nicole Kindred A, DO  mirtazapine (REMERON) 15 MG tablet Take 15 mg by mouth at bedtime.   Yes [provider]  mometasone-formoterol (DULERA) 200-5 MCG/ACT AERO Inhale 2 puffs into the lungs 2 (two) times daily. 07/07/21  Yes Enzo Bi, MD  Multiple Vitamin (MULTIVITAMIN WITH MINERALS) TABS tablet Take 1 tablet by mouth daily. 07/07/21  Yes Enzo Bi, MD  polyethylene glycol (MIRALAX / GLYCOLAX) 17 g packet Take 17 g by mouth daily as needed for mild constipation. 06/23/21  Yes Richarda Osmond, MD  tamsulosin (FLOMAX) 0.4 MG CAPS capsule Take 1 capsule (0.4 mg total) by mouth daily after supper. 07/07/21 10/05/21 Yes Enzo Bi, MD  vitamin B-12 1000 MCG tablet Take 1 tablet (1,000 mcg total) by mouth daily. 07/19/21  Yes Ezekiel Slocumb, DO   Review of Systems  Constitutional:  Positive for fatigue (easily). Negative for appetite change.  HENT:  Positive for tinnitus. Negative for congestion, postnasal drip and sore throat.   Eyes: Negative.    Respiratory:  Positive for shortness of breath (easily). Negative for cough and wheezing.   Cardiovascular:  Positive for leg swelling. Negative for chest pain and palpitations.  Gastrointestinal:  Negative for abdominal distention and abdominal pain.  Endocrine: Negative.   Genitourinary: Negative.   Musculoskeletal:  Positive for arthralgias (left shoulder).  Skin:  Positive for wound.       Says that Matthew [name] has a wound on his "backside"  Allergic/Immunologic: Negative.   Neurological:  Positive for dizziness. Negative for light-headedness.  Hematological:  Negative for adenopathy. Bruises/bleeds easily.  Psychiatric/Behavioral:  Negative for dysphoric mood and sleep disturbance (trouble falling asleep; sleeping on 1 pillow). The patient is nervous/anxious.    Vitals:  07/20/21 1319  BP: 111/72  Pulse: 88  Resp: 18  SpO2: 99%  Height: 6\' 2"  (1.88 m)   Wt Readings from Last 3 Encounters:  07/18/21 (!) 304 lb 3.8 oz (138 kg)  07/06/21 295 lb 3.1 oz (133.9 kg)  06/23/21 291 lb 14.2 oz (132.4 kg)   Lab Results  Component Value Date   CREATININE 2.54 (H) 07/18/2021   CREATININE 2.72 (H) 07/17/2021   CREATININE 2.78 (H) 07/16/2021   Physical Exam Vitals and nursing note reviewed.  Constitutional:      Appearance: Matthew [name] is well-developed.  HENT:     Head: Normocephalic and atraumatic.  Neck:     Vascular: No JVD.  Cardiovascular:     Rate and Rhythm: Normal rate. Rhythm irregular.  Pulmonary:     Effort: Pulmonary effort is normal.     Breath sounds: Decreased breath sounds present. No wheezing, rhonchi or rales.  Abdominal:     General: There is no distension.     Palpations: Abdomen is soft.  Musculoskeletal:        General: No tenderness.     Cervical back: Normal range of motion and neck supple.     Right lower leg: Edema (1+ pitting) present.     Left lower leg: Edema (1+ pitting) present.  Skin:    General: Skin is warm and dry.     Findings: Bruising (left forearm  has skin tear/ bruising) present.     Comments: Patient says that Matthew [name] has a wound on his buttocks from laying on a gurney  Neurological:     General: No focal deficit present.     Mental Status: Matthew [name] is alert and oriented to person, place, and time.  Psychiatric:        Mood and Affect: Mood is anxious.        Behavior: Behavior normal.        Thought Content: Thought content normal.    Assessment & Plan:  1: Chronic heart failure with preserved ejection fraction with structural changes (LAE)- - NYHA class III - fluid overloaded today with pedal edema and worsening shortness of breath - not being weighed daily at [facility] and an order was written for him to be weighed daily and to call for an overnight weight gain of > 2 pounds or a weekly weight gain of > 5 pounds - per [facility] weight chart, Matthew [name] weighed 264.8 pounds on 06/24/21 and 313 pounds on 07/18/21 (difference of 48 pounds in < 1 month) - did not weigh patient in office because Matthew [name] was unsure if Matthew [name] could stand long enough - will begin furosemide 20mg  daily along with potassium 55meq daily knowing that his renal function isn't great - order written for BMP to be drawn on 07/27/21 and results faxed to [place] - not adding salt to his food - BNP 07/11/21 was 95.6  2: HTN with CKD- - BP looks good today - saw PCP Mauro Kaufmann) 12/16/20; now seeing PCP at SNF - BMP 07/18/21 reviewed and showed sodium 135, potassium 3.9, creatinine 2.54 and GFR 26 - nephrology referral placed  3: Atrial fibrillation- [facility] cardiology saw patient during recent admission; will need to schedule post hospital appointment at his next visit - rate controlled today - currently taking apixaban, diltiazem and metoprolol  4: Anemia- - hemoglobin 07/18/21 was 9.1  5: Anxiety- - patient admits that Matthew [name] feels anxious about his health and that there's not as many people around at [facility] versus when Matthew [name] was  in the hospital and that Matthew [name] knew there were people around at the hospital to take  care of him - Matthew [name] understands that his anxiety makes him more short of breath which makes him more anxious - emotional support offered; currently taking duloxetine  6: Obstructive sleep apnea- - had been wearing CPAP but says that his home equipment is broke and Matthew [name] hasn't been able to find someone to fix it - order written for the facility to begin CPAP therapy while Matthew [name] is there   Facility medication list reviewed.   Return in 2 weeks or sooner for any questions/problems before then.

## 2021-07-20 ENCOUNTER — Encounter: Payer: Self-pay | Admitting: Family

## 2021-07-20 ENCOUNTER — Ambulatory Visit: Payer: Medicare HMO | Attending: Family | Admitting: Family

## 2021-07-20 ENCOUNTER — Other Ambulatory Visit: Payer: Self-pay

## 2021-07-20 VITALS — BP 111/72 | HR 88 | Resp 18 | Ht 74.0 in

## 2021-07-20 DIAGNOSIS — I130 Hypertensive heart and chronic kidney disease with heart failure and stage 1 through stage 4 chronic kidney disease, or unspecified chronic kidney disease: Secondary | ICD-10-CM | POA: Diagnosis not present

## 2021-07-20 DIAGNOSIS — I4891 Unspecified atrial fibrillation: Secondary | ICD-10-CM | POA: Insufficient documentation

## 2021-07-20 DIAGNOSIS — I10 Essential (primary) hypertension: Secondary | ICD-10-CM | POA: Diagnosis not present

## 2021-07-20 DIAGNOSIS — D649 Anemia, unspecified: Secondary | ICD-10-CM | POA: Insufficient documentation

## 2021-07-20 DIAGNOSIS — N184 Chronic kidney disease, stage 4 (severe): Secondary | ICD-10-CM | POA: Diagnosis not present

## 2021-07-20 DIAGNOSIS — Z7901 Long term (current) use of anticoagulants: Secondary | ICD-10-CM | POA: Insufficient documentation

## 2021-07-20 DIAGNOSIS — I480 Paroxysmal atrial fibrillation: Secondary | ICD-10-CM

## 2021-07-20 DIAGNOSIS — F419 Anxiety disorder, unspecified: Secondary | ICD-10-CM | POA: Diagnosis not present

## 2021-07-20 DIAGNOSIS — I5032 Chronic diastolic (congestive) heart failure: Secondary | ICD-10-CM | POA: Diagnosis not present

## 2021-07-20 DIAGNOSIS — J449 Chronic obstructive pulmonary disease, unspecified: Secondary | ICD-10-CM | POA: Diagnosis not present

## 2021-07-20 DIAGNOSIS — I509 Heart failure, unspecified: Secondary | ICD-10-CM | POA: Diagnosis not present

## 2021-07-20 DIAGNOSIS — Z79899 Other long term (current) drug therapy: Secondary | ICD-10-CM | POA: Insufficient documentation

## 2021-07-20 DIAGNOSIS — N189 Chronic kidney disease, unspecified: Secondary | ICD-10-CM | POA: Insufficient documentation

## 2021-07-20 DIAGNOSIS — I2510 Atherosclerotic heart disease of native coronary artery without angina pectoris: Secondary | ICD-10-CM | POA: Insufficient documentation

## 2021-07-20 DIAGNOSIS — G4733 Obstructive sleep apnea (adult) (pediatric): Secondary | ICD-10-CM | POA: Diagnosis not present

## 2021-07-20 DIAGNOSIS — D638 Anemia in other chronic diseases classified elsewhere: Secondary | ICD-10-CM | POA: Diagnosis not present

## 2021-07-20 NOTE — Patient Instructions (Signed)
Begin weighing daily and call for an overnight weight gain of 3 pounds or more or a weekly weight gain of more than 5 pounds.  

## 2021-07-21 DIAGNOSIS — I10 Essential (primary) hypertension: Secondary | ICD-10-CM | POA: Diagnosis not present

## 2021-07-21 DIAGNOSIS — R4182 Altered mental status, unspecified: Secondary | ICD-10-CM | POA: Diagnosis not present

## 2021-07-23 ENCOUNTER — Other Ambulatory Visit: Payer: Self-pay

## 2021-07-23 ENCOUNTER — Emergency Department: Payer: Medicare HMO

## 2021-07-23 ENCOUNTER — Emergency Department
Admission: EM | Admit: 2021-07-23 | Discharge: 2021-07-23 | Disposition: A | Discharge status: Home / Self Care | Payer: Medicare HMO | Attending: Emergency Medicine | Admitting: Emergency Medicine

## 2021-07-23 ENCOUNTER — Encounter: Payer: Self-pay | Admitting: Emergency Medicine

## 2021-07-23 DIAGNOSIS — Z7901 Long term (current) use of anticoagulants: Secondary | ICD-10-CM | POA: Diagnosis not present

## 2021-07-23 DIAGNOSIS — Z20822 Contact with and (suspected) exposure to covid-19: Secondary | ICD-10-CM | POA: Insufficient documentation

## 2021-07-23 DIAGNOSIS — I5031 Acute diastolic (congestive) heart failure: Secondary | ICD-10-CM | POA: Diagnosis not present

## 2021-07-23 DIAGNOSIS — G4733 Obstructive sleep apnea (adult) (pediatric): Secondary | ICD-10-CM | POA: Diagnosis not present

## 2021-07-23 DIAGNOSIS — Z96643 Presence of artificial hip joint, bilateral: Secondary | ICD-10-CM | POA: Insufficient documentation

## 2021-07-23 DIAGNOSIS — I2510 Atherosclerotic heart disease of native coronary artery without angina pectoris: Secondary | ICD-10-CM | POA: Diagnosis not present

## 2021-07-23 DIAGNOSIS — I130 Hypertensive heart and chronic kidney disease with heart failure and stage 1 through stage 4 chronic kidney disease, or unspecified chronic kidney disease: Secondary | ICD-10-CM | POA: Diagnosis not present

## 2021-07-23 DIAGNOSIS — N184 Chronic kidney disease, stage 4 (severe): Secondary | ICD-10-CM | POA: Insufficient documentation

## 2021-07-23 DIAGNOSIS — D649 Anemia, unspecified: Secondary | ICD-10-CM | POA: Diagnosis not present

## 2021-07-23 DIAGNOSIS — R0602 Shortness of breath: Secondary | ICD-10-CM | POA: Diagnosis not present

## 2021-07-23 DIAGNOSIS — J449 Chronic obstructive pulmonary disease, unspecified: Secondary | ICD-10-CM | POA: Insufficient documentation

## 2021-07-23 DIAGNOSIS — R000 Tachycardia, unspecified: Secondary | ICD-10-CM | POA: Diagnosis not present

## 2021-07-23 DIAGNOSIS — J4540 Moderate persistent asthma, uncomplicated: Secondary | ICD-10-CM | POA: Insufficient documentation

## 2021-07-23 DIAGNOSIS — I110 Hypertensive heart disease with heart failure: Secondary | ICD-10-CM | POA: Diagnosis not present

## 2021-07-23 DIAGNOSIS — I509 Heart failure, unspecified: Secondary | ICD-10-CM | POA: Diagnosis not present

## 2021-07-23 DIAGNOSIS — Z79899 Other long term (current) drug therapy: Secondary | ICD-10-CM | POA: Diagnosis not present

## 2021-07-23 DIAGNOSIS — I4891 Unspecified atrial fibrillation: Secondary | ICD-10-CM | POA: Diagnosis not present

## 2021-07-23 DIAGNOSIS — N189 Chronic kidney disease, unspecified: Secondary | ICD-10-CM | POA: Diagnosis not present

## 2021-07-23 DIAGNOSIS — Z9114 Patient's other noncompliance with medication regimen: Secondary | ICD-10-CM

## 2021-07-23 DIAGNOSIS — J441 Chronic obstructive pulmonary disease with (acute) exacerbation: Secondary | ICD-10-CM | POA: Diagnosis not present

## 2021-07-23 DIAGNOSIS — F419 Anxiety disorder, unspecified: Secondary | ICD-10-CM | POA: Diagnosis not present

## 2021-07-23 DIAGNOSIS — S2231XA Fracture of one rib, right side, initial encounter for closed fracture: Secondary | ICD-10-CM | POA: Diagnosis not present

## 2021-07-23 LAB — BASIC METABOLIC PANEL
Anion gap: 9 (ref 5–15)
BUN: 39 mg/dL — ABNORMAL HIGH (ref 8–23)
CO2: 27 mmol/L (ref 22–32)
Calcium: 9.4 mg/dL (ref 8.9–10.3)
Chloride: 101 mmol/L (ref 98–111)
Creatinine, Ser: 2.48 mg/dL — ABNORMAL HIGH (ref 0.61–1.24)
GFR, Estimated: 27 mL/min — ABNORMAL LOW (ref >60)
Glucose, Bld: 122 mg/dL — ABNORMAL HIGH (ref 70–99)
Potassium: 5.3 mmol/L — ABNORMAL HIGH (ref 3.5–5.1)
Sodium: 137 mmol/L (ref 135–145)

## 2021-07-23 LAB — URINALYSIS, ROUTINE W REFLEX MICROSCOPIC
Bilirubin Urine: NEGATIVE
Glucose, UA: NEGATIVE mg/dL
Hgb urine dipstick: NEGATIVE
Ketones, ur: NEGATIVE mg/dL
Nitrite: NEGATIVE
Protein, ur: NEGATIVE mg/dL
Specific Gravity, Urine: 1.009 (ref 1.005–1.030)
pH: 5 (ref 5.0–8.0)

## 2021-07-23 LAB — CBC
HCT: 30.1 % — ABNORMAL LOW (ref 39.0–52.0)
Hemoglobin: 9.3 g/dL — ABNORMAL LOW (ref 13.0–17.0)
MCH: 31.6 pg (ref 26.0–34.0)
MCHC: 30.9 g/dL (ref 30.0–36.0)
MCV: 102.4 fL — ABNORMAL HIGH (ref 80.0–100.0)
Platelets: 219 10*3/uL (ref 150–400)
RBC: 2.94 MIL/uL — ABNORMAL LOW (ref 4.22–5.81)
RDW: 14.5 % (ref 11.5–15.5)
WBC: 9.1 10*3/uL (ref 4.0–10.5)
nRBC: 0 % (ref 0.0–0.2)

## 2021-07-23 LAB — RESP PANEL BY RT-PCR (FLU A&B, COVID) ARPGX2
Influenza A by PCR: NEGATIVE
Influenza B by PCR: NEGATIVE
SARS Coronavirus 2 by RT PCR: NEGATIVE

## 2021-07-23 LAB — TROPONIN I (HIGH SENSITIVITY)
Troponin I (High Sensitivity): 11 ng/L (ref <18)
Troponin I (High Sensitivity): 11 ng/L (ref <18)

## 2021-07-23 MED ORDER — METOPROLOL TARTRATE 50 MG PO TABS
50.0000 mg | ORAL_TABLET | Freq: Once | ORAL | Status: AC
  Administered 2021-07-23: 50 mg via ORAL
  Filled 2021-07-23: qty 1

## 2021-07-23 MED ORDER — SODIUM CHLORIDE 0.9 % IV SOLN
1.0000 g | Freq: Once | INTRAVENOUS | Status: AC
  Administered 2021-07-23: 1 g via INTRAVENOUS
  Filled 2021-07-23: qty 10

## 2021-07-23 MED ORDER — MOMETASONE FURO-FORMOTEROL FUM 200-5 MCG/ACT IN AERO: 2.0000 | INHALATION_SPRAY | Freq: Two times a day (BID) | RESPIRATORY_TRACT | 2 refills | Status: DC

## 2021-07-23 MED ORDER — APIXABAN 5 MG PO TABS: 5.0000 mg | ORAL_TABLET | Freq: Two times a day (BID) | ORAL | 0 refills | Status: DC

## 2021-07-23 MED ORDER — CEFDINIR 300 MG PO CAPS: 300.0000 mg | ORAL_CAPSULE | Freq: Two times a day (BID) | ORAL | 0 refills | Status: DC

## 2021-07-23 MED ORDER — DILTIAZEM HCL ER COATED BEADS 300 MG PO CP24
300.0000 mg | ORAL_CAPSULE | Freq: Once | ORAL | Status: AC
  Administered 2021-07-23: 300 mg via ORAL
  Filled 2021-07-23: qty 1

## 2021-07-23 MED ORDER — DILTIAZEM HCL ER COATED BEADS 300 MG PO CP24: 300.0000 mg | ORAL_CAPSULE | Freq: Every day | ORAL | 0 refills | Status: DC

## 2021-07-23 MED ORDER — DILTIAZEM HCL 25 MG/5ML IV SOLN
20.0000 mg | Freq: Once | INTRAVENOUS | Status: AC
  Administered 2021-07-23: 20 mg via INTRAVENOUS
  Filled 2021-07-23: qty 5

## 2021-07-23 MED ORDER — METOPROLOL TARTRATE 50 MG PO TABS: 50.0000 mg | ORAL_TABLET | Freq: Two times a day (BID) | ORAL | 0 refills | Status: DC

## 2021-07-23 MED ORDER — IPRATROPIUM-ALBUTEROL 0.5-2.5 (3) MG/3ML IN SOLN
RESPIRATORY_TRACT | Status: AC
  Filled 2021-07-23: qty 3

## 2021-07-23 MED ORDER — METOPROLOL TARTRATE 25 MG PO TABS
25.0000 mg | ORAL_TABLET | Freq: Once | ORAL | Status: AC
  Administered 2021-07-23: 25 mg via ORAL
  Filled 2021-07-23: qty 1

## 2021-07-23 NOTE — ED Triage Notes (Signed)
Arrived by EMS from [facility]. Patient called 911 for SOB. HR 101, 98% 2L O2, 117/64 blood pressure, 98.6oral. HX of asthma, COPD, a-fib.

## 2021-07-23 NOTE — ED Triage Notes (Incomplete)
Pt via EMS from [facility]. Pt c/o intermittent SOB for the past 5 days and generalized CP that started 30 mins ago. Pt is A&Ox4 and but tachypneic on arrival. Pt has a hx of COPD and asthma. Pt placed on 2L [place] by EMS for comfort, pt states he does not wear O2 at home.

## 2021-07-23 NOTE — ED Provider Notes (Signed)
[facility] Emergency Department Provider Note  ____________________________________________  Time seen: Approximately 4:20 PM  I have reviewed the triage vital signs and the nursing notes.   HISTORY  Chief Complaint Shortness of Breath and Chest Pain    HPI Matthew [name] is a 70 y.o. male with a history of CAD CHF COPD hypertension atrial fibrillation who comes ED complaining of shortness of breath since this morning.  He states "I do not know why this keeps happening.  They sent me home and the next morning I wake up and like this again."    He admits that he is not currently taking any medications.  He was just discharged from the hospital 5 days ago, discharged on Cardizem CD300, metoprolol 25 4 times daily, Eliquis.  Denies any vomiting, black or bloody stool.    Past Medical History:  Diagnosis Date  . Anemia   . Anxiety   . Arrhythmia    atrial fibrillation  . Asthma   . CAD (coronary artery disease)   . CHF (congestive heart failure) ([place])   . Chronic kidney disease   . COPD (chronic obstructive pulmonary disease) ([place])   . Depression   . Edema   . Gout   . Hip dislocation, bilateral ([place])   . Hypertension   . Osteoporosis   . Sleep apnea      Patient Active Problem List   Diagnosis Date Noted  . Anemia due to vitamin B12 deficiency   . Delirium   . Hyperkalemia   . Heart failure with preserved ejection fraction ([place])   . CKD (chronic kidney disease) stage 4, GFR 15-29 ml/min (HCC)   . Vitamin B12 deficiency   . Depression   . Atrial fibrillation with rapid ventricular response ([place]) 07/11/2021  . Anemia of chronic disease 07/11/2021  . Asthma exacerbation 07/03/2021  . Chest pain 07/03/2021  . Acute gout   . Acute diastolic congestive heart failure ([place])   . Right leg pain   . Weakness   . SOB (shortness of breath) 06/12/2021  . Bilateral leg edema 06/12/2021  . Atrial fibrillation with RVR ([place]) 06/12/2021  . AKI  (acute kidney injury) ([place]) 06/12/2021  . Elevated glucose level 06/12/2021  . Obesity (BMI 30-39.9) 06/12/2021  . Macrocytosis 06/12/2021  . Acute respiratory failure ([place]) 06/12/2021  . Chronic pain of left knee 12/16/2020  . COPD with acute exacerbation ([place]) 12/04/2020  . Acute right hip pain 12/04/2020  . H/O bilateral hip replacements 12/04/2020  . Stage 3b chronic kidney disease ([place]) 12/04/2020  . Displaced comminuted fracture of shaft of left femur, initial encounter for closed fracture ([place]) 05/19/2020  . Asthma 05/18/2020  . Essential hypertension 05/18/2020  . Femur fracture, left ([place]) 05/18/2020  . Closed comminuted intra-articular fracture of distal femur ([place]) 05/17/2020  . Moderate persistent asthma with exacerbation 10/15/2018     Past Surgical History:  Procedure Laterality Date  . ABDOMINAL SURGERY    . CHOLECYSTECTOMY    . COLOSTOMY    . COLOSTOMY TAKEDOWN    . HERNIA REPAIR    . LEFT HEART CATH AND CORONARY ANGIOGRAPHY N/A 10/17/2018   Procedure: LEFT HEART CATH AND CORONARY ANGIOGRAPHY with possible PCI and stent;  Surgeon: Yolonda Kida, MD;  Location: [place] [facility];  Service: Cardiovascular;  Laterality: N/A;  . ORIF FEMUR FRACTURE Left 05/19/2020   Procedure: OPEN REDUCTION INTERNAL FIXATION (ORIF) DISTAL FEMUR FRACTURE;  Surgeon: Shona Needles, MD;  Location: [facility];  Service: Orthopedics;  Laterality: Left;  . TOTAL HIP ARTHROPLASTY Bilateral      Prior to Admission medications   Medication Sig Start Date End Date Taking? Authorizing Provider  acetaminophen (TYLENOL) 325 MG tablet Take 2 tablets (650 mg total) by mouth every 4 (four) hours as needed for headache or mild pain. 07/18/21   Ezekiel Slocumb, DO  albuterol (VENTOLIN HFA) 108 (90 Base) MCG/ACT inhaler Inhale 2 puffs into the lungs every 6 (six) hours as needed for wheezing or shortness of breath.    [provider]  allopurinol (ZYLOPRIM) 100 MG tablet Take 1 tablet (100  mg total) by mouth daily. 07/18/21   Ezekiel Slocumb, DO  apixaban (ELIQUIS) 5 MG TABS tablet Take 1 tablet (5 mg total) by mouth 2 (two) times daily. 07/07/21 10/05/21  Enzo Bi, MD  bisacodyl (DULCOLAX) 5 MG EC tablet Take 1 tablet (5 mg total) by mouth daily as needed for moderate constipation. 06/23/21   Richarda Osmond, MD  colchicine 0.6 MG tablet Take 0.5 tablets (0.3 mg total) by mouth daily. 06/24/21   Richarda Osmond, MD  diltiazem (CARDIZEM CD) 300 MG 24 hr capsule Take 1 capsule (300 mg total) by mouth daily. 07/07/21 10/05/21  Enzo Bi, MD  DULoxetine (CYMBALTA) 60 MG capsule Take 1 capsule (60 mg total) by mouth 2 (two) times daily. 06/15/21   [name], MD  furosemide (LASIX) 20 MG tablet Take 20 mg by mouth daily.    [provider]  HYDROcodone-acetaminophen (NORCO/VICODIN) 5-325 MG tablet Take 1 tablet by mouth every 6 (six) hours as needed for moderate pain or severe pain. 07/18/21 07/18/22  Nicole Kindred A, DO  ipratropium-albuterol (DUONEB) 0.5-2.5 (3) MG/3ML SOLN Take 3 mLs by nebulization every 6 (six) hours as needed. 07/07/21   Enzo Bi, MD  melatonin 5 MG TABS Take 1 tablet (5 mg total) by mouth at bedtime. 06/23/21   Richarda Osmond, MD  metoprolol tartrate (LOPRESSOR) 50 MG tablet Take 1 tablet (50 mg total) by mouth 2 (two) times daily. 07/18/21   Nicole Kindred A, DO  mirtazapine (REMERON) 15 MG tablet Take 15 mg by mouth at bedtime.    [provider]  mometasone-formoterol (DULERA) 200-5 MCG/ACT AERO Inhale 2 puffs into the lungs 2 (two) times daily. 07/07/21   Enzo Bi, MD  Multiple Vitamin (MULTIVITAMIN WITH MINERALS) TABS tablet Take 1 tablet by mouth daily. 07/07/21   Enzo Bi, MD  polyethylene glycol (MIRALAX / GLYCOLAX) 17 g packet Take 17 g by mouth daily as needed for mild constipation. 06/23/21   Richarda Osmond, MD  potassium chloride SA (KLOR-CON M) 20 MEQ tablet Take 20 mEq by mouth daily.    [provider]   tamsulosin (FLOMAX) 0.4 MG CAPS capsule Take 1 capsule (0.4 mg total) by mouth daily after supper. 07/07/21 10/05/21  Enzo Bi, MD  vitamin B-12 1000 MCG tablet Take 1 tablet (1,000 mcg total) by mouth daily. 07/19/21   Ezekiel Slocumb, DO     Allergies Demerol [meperidine hcl], Lisinopril, and Meperidine and related   Family History  Problem Relation Age of Onset  . COPD Mother   . Cancer Mother   . Melanoma Father     Social History Social History   Tobacco Use  . Smoking status: Never  . Smokeless tobacco: Never  Vaping Use  . Vaping Use: Never used  Substance Use Topics  . Alcohol use: Not Currently    Review of  Systems  Constitutional:   No fever or chills.  ENT:   No sore throat. No rhinorrhea. Cardiovascular:   No chest pain or syncope. Respiratory: Positive shortness of breath without cough. Gastrointestinal:   Negative for abdominal pain, vomiting and diarrhea.  Musculoskeletal:   Negative for focal pain or swelling All other systems reviewed and are negative except as documented above in ROS and HPI.  ____________________________________________   PHYSICAL EXAM:  VITAL SIGNS: ED Triage Vitals [07/23/21 1413]  Enc Vitals Group     BP (!) 114/93     Pulse Rate (!) 111     Resp (!) 26     Temp 98.7 F (37.1 C)     Temp Source Oral     SpO2 95 %     Weight 280 lb (127 kg)     Height 6\' 2"  (1.88 m)     Head Circumference      [facility]      Pain Score 8     Pain Loc      Pain Edu?      Excl. in [place]?     Vital signs reviewed, nursing assessments reviewed.   Constitutional:   Alert and oriented. Non-toxic appearance. Eyes:   Conjunctivae are normal. EOMI. PERRL. ENT      Head:   Normocephalic and atraumatic.      Nose:   Wearing a mask.      Mouth/Throat:   Wearing a mask.      Neck:   No meningismus. Full ROM. Hematological/Lymphatic/Immunilogical:   No cervical lymphadenopathy. Cardiovascular:   Irregularly irregular rhythm, heart rate  120-130.[place] Kitchen Symmetric bilateral radial and DP pulses.  No murmurs. Cap refill less than 2 seconds. Respiratory:   Normal respiratory effort without tachypnea/retractions.  Quiet breath sounds diffusely with symmetric air movement in all lung fields.  No focal crackles or wheezing. Gastrointestinal:   Soft and nontender. Non distended. There is no CVA tenderness.  No rebound, rigidity, or guarding. Genitourinary:   deferred Musculoskeletal:   Normal range of motion in all extremities. No joint effusions.  No lower extremity tenderness.  No edema. Neurologic:   Normal speech and language.  Motor grossly intact. No acute focal neurologic deficits are appreciated.  Skin:    Skin is warm, dry and intact. No rash noted.  No petechiae, purpura, or bullae.  ____________________________________________    LABS (pertinent positives/negatives) (all labs ordered are listed, but only abnormal results are displayed) Labs Reviewed  BASIC METABOLIC PANEL - Abnormal; Notable for the following components:      Result Value   Potassium 5.3 (*)    Glucose, Bld 122 (*)    BUN 39 (*)    Creatinine, Ser 2.48 (*)    GFR, Estimated 27 (*)    All other components within normal limits  CBC - Abnormal; Notable for the following components:   RBC 2.94 (*)    Hemoglobin 9.3 (*)    HCT 30.1 (*)    MCV 102.4 (*)    All other components within normal limits  URINALYSIS, ROUTINE W REFLEX MICROSCOPIC - Abnormal; Notable for the following components:   Color, Urine YELLOW (*)    APPearance HAZY (*)    Leukocytes,Ua LARGE (*)    Bacteria, UA MANY (*)    All other components within normal limits  RESP PANEL BY RT-PCR (FLU A&B, COVID) ARPGX2  TROPONIN I (HIGH SENSITIVITY)  TROPONIN I (HIGH SENSITIVITY)   ____________________________________________   EKG  Interpreted  by me Atrial fibrillation with RVR, rate of 114.  Normal axis and intervals.  Poor R wave progression.  Normal ST segments.  Lateral T wave  inversions, likely rate related demand ischemia.  ____________________________________________    RADIOLOGY  DG Chest 2 View  Result Date: 07/23/2021 CLINICAL DATA:  Shortness of breath EXAM: CHEST - 2 VIEW COMPARISON:  Chest x-ray dated July 11, 2021 FINDINGS: Cardiac and mediastinal contours are unchanged. Unchanged mild opacities of the bilateral costophrenic angles, likely due to scarring. No new parenchymal opacity. No evidence of pleural effusion or pneumothorax. Old posterior right rib fracture. IMPRESSION: No active cardiopulmonary disease. Electronically Signed   By: Yetta Glassman M.D.   On: 07/23/2021 14:53    ____________________________________________   PROCEDURES .1-3 Lead EKG Interpretation Performed by: Carrie Mew, MD Authorized by: Carrie Mew, MD     Interpretation: abnormal     ECG rate:  130   ECG rate assessment: tachycardic     Rhythm: atrial fibrillation     Ectopy: none     Conduction: normal   Comments:        .Critical Care Performed by: Carrie Mew, MD Authorized by: Carrie Mew, MD   Critical care provider statement:    Critical care time (minutes):  33   Critical care time was exclusive of:  Separately billable procedures and treating other patients   Critical care was necessary to treat or prevent imminent or life-threatening deterioration of the following conditions:  Cardiac failure and circulatory failure   Critical care was time spent personally by me on the following activities:  Development of treatment plan with patient or surrogate, discussions with consultants, evaluation of patient's response to treatment, examination of patient, obtaining history from patient or surrogate, ordering and performing treatments and interventions, ordering and review of laboratory studies, ordering and review of radiographic studies, pulse oximetry, re-evaluation of patient's condition and review of old  charts  ____________________________________________  DIFFERENTIAL DIAGNOSIS   Uncontrolled atrial fibrillation, decompensated heart failure, pneumonia, pulm edema, pleural effusion, viral illness  CLINICAL IMPRESSION / ASSESSMENT AND PLAN / ED COURSE  Medications ordered in the ED: Medications  diltiazem (CARDIZEM) injection 20 mg (20 mg Intravenous Given 07/23/21 1619)    Pertinent labs & imaging results that were available during my care of the patient were reviewed by me and considered in my medical decision making (see chart for details).  Matthew [name] was evaluated in Emergency Department on 07/23/2021 for the symptoms described in the history of present illness. He was evaluated in the context of the global COVID-19 pandemic, which necessitated consideration that the patient might be at risk for infection with the SARS-CoV-2 virus that causes COVID-19. Institutional protocols and algorithms that pertain to the evaluation of patients at risk for COVID-19 are in a state of rapid change based on information released by regulatory bodies including the CDC and federal and state organizations. These policies and algorithms were followed during the patient's care in the ED.   Patient returns with shortness of breath in the setting of A. fib with RVR.  It appears that medication noncompliance is the biggest issue.  I reviewed this with the patient.  Chest x-ray and initial labs are all unremarkable, baseline CKD and anemia.  Chest x-ray appears clear, no focal findings on exam.  We will repeat troponin, obtain flu/COVID swab, give diltiazem for rate control.  Clinical Course as of 07/23/21 1721  Sat Jul 23, 2021  1631 Now rate controlled with a  heart rate of about 100 after IV diltiazem push.  I will start him on his oral diltiazem and metoprolol.  Urinalysis consistent with a UTI, will send a culture and start him on antibiotics. [PS]  [place] and flu negative.  Repeat troponin normal.   Patient reports his shortness of breath is improved and he feels comfortable with discharge home.  He is adequately rate controlled, and I expect expect this to continue improving over time with continued medication.  I have sent refills to the patient's pharmacy again. [PS]    Clinical Course User Index [PS] Carrie Mew, MD     ____________________________________________   FINAL CLINICAL IMPRESSION(S) / ED DIAGNOSES    Final diagnoses:  None     ED Discharge Orders     None       Portions of this note were generated with dragon dictation software. Dictation errors may occur despite best attempts at proofreading.    Carrie Mew, MD 07/23/21 [phone]

## 2021-07-23 NOTE — ED Notes (Signed)
Wife states pt lives at [facility] and can be transported by EMS.

## 2021-07-23 NOTE — ED Notes (Signed)
Pt bladder scanned at this time- 150 ml's recorded

## 2021-07-23 NOTE — Discharge Instructions (Addendum)
Be sure to take your medications every day to ensure that your atrial fibrillation and other chronic health conditions remained under control.  Please follow-up with your doctor on Monday to reassess your symptoms.

## 2021-07-23 NOTE — ED Notes (Signed)
Pt c/o sob. Oxygen 100%. EDP notified.

## 2021-07-23 NOTE — ED Notes (Addendum)
Pt assisted w/ calling wife for ride home.

## 2021-07-25 DIAGNOSIS — M6281 Muscle weakness (generalized): Secondary | ICD-10-CM | POA: Diagnosis not present

## 2021-07-25 DIAGNOSIS — I10 Essential (primary) hypertension: Secondary | ICD-10-CM | POA: Diagnosis not present

## 2021-07-25 DIAGNOSIS — J449 Chronic obstructive pulmonary disease, unspecified: Secondary | ICD-10-CM | POA: Diagnosis not present

## 2021-07-25 DIAGNOSIS — I5032 Chronic diastolic (congestive) heart failure: Secondary | ICD-10-CM | POA: Diagnosis not present

## 2021-07-26 ENCOUNTER — Encounter: Payer: Medicare HMO | Admitting: *Deleted

## 2021-07-26 DIAGNOSIS — D649 Anemia, unspecified: Secondary | ICD-10-CM | POA: Diagnosis not present

## 2021-07-26 DIAGNOSIS — F419 Anxiety disorder, unspecified: Secondary | ICD-10-CM | POA: Diagnosis not present

## 2021-07-26 DIAGNOSIS — I10 Essential (primary) hypertension: Secondary | ICD-10-CM | POA: Diagnosis not present

## 2021-07-26 DIAGNOSIS — N179 Acute kidney failure, unspecified: Secondary | ICD-10-CM | POA: Diagnosis not present

## 2021-07-26 DIAGNOSIS — E46 Unspecified protein-calorie malnutrition: Secondary | ICD-10-CM | POA: Diagnosis not present

## 2021-07-26 DIAGNOSIS — I482 Chronic atrial fibrillation, unspecified: Secondary | ICD-10-CM | POA: Diagnosis not present

## 2021-07-27 LAB — URINE CULTURE: Culture: >=100000 — AB

## 2021-07-29 DIAGNOSIS — E875 Hyperkalemia: Secondary | ICD-10-CM | POA: Diagnosis not present

## 2021-07-29 DIAGNOSIS — N179 Acute kidney failure, unspecified: Secondary | ICD-10-CM | POA: Diagnosis not present

## 2021-07-29 DIAGNOSIS — R634 Abnormal weight loss: Secondary | ICD-10-CM | POA: Diagnosis not present

## 2021-08-01 DIAGNOSIS — G4733 Obstructive sleep apnea (adult) (pediatric): Secondary | ICD-10-CM | POA: Diagnosis not present

## 2021-08-01 DIAGNOSIS — I5033 Acute on chronic diastolic (congestive) heart failure: Secondary | ICD-10-CM | POA: Diagnosis not present

## 2021-08-01 DIAGNOSIS — J449 Chronic obstructive pulmonary disease, unspecified: Secondary | ICD-10-CM | POA: Diagnosis not present

## 2021-08-01 DIAGNOSIS — F32A Depression, unspecified: Secondary | ICD-10-CM | POA: Diagnosis not present

## 2021-08-01 DIAGNOSIS — I482 Chronic atrial fibrillation, unspecified: Secondary | ICD-10-CM | POA: Diagnosis not present

## 2021-08-01 DIAGNOSIS — D649 Anemia, unspecified: Secondary | ICD-10-CM | POA: Diagnosis not present

## 2021-08-01 DIAGNOSIS — K5900 Constipation, unspecified: Secondary | ICD-10-CM | POA: Diagnosis not present

## 2021-08-03 ENCOUNTER — Telehealth: Payer: Self-pay

## 2021-08-03 ENCOUNTER — Ambulatory Visit: Payer: Medicare HMO | Attending: Family | Admitting: Family

## 2021-08-03 ENCOUNTER — Encounter: Payer: Self-pay | Admitting: Family

## 2021-08-03 ENCOUNTER — Other Ambulatory Visit: Payer: Self-pay

## 2021-08-03 VITALS — BP 103/62 | HR 101 | Resp 20 | Ht 74.0 in

## 2021-08-03 DIAGNOSIS — I480 Paroxysmal atrial fibrillation: Secondary | ICD-10-CM

## 2021-08-03 DIAGNOSIS — D638 Anemia in other chronic diseases classified elsewhere: Secondary | ICD-10-CM | POA: Diagnosis not present

## 2021-08-03 DIAGNOSIS — I5032 Chronic diastolic (congestive) heart failure: Secondary | ICD-10-CM

## 2021-08-03 DIAGNOSIS — I130 Hypertensive heart and chronic kidney disease with heart failure and stage 1 through stage 4 chronic kidney disease, or unspecified chronic kidney disease: Secondary | ICD-10-CM | POA: Diagnosis not present

## 2021-08-03 DIAGNOSIS — M109 Gout, unspecified: Secondary | ICD-10-CM | POA: Diagnosis not present

## 2021-08-03 DIAGNOSIS — D649 Anemia, unspecified: Secondary | ICD-10-CM | POA: Diagnosis not present

## 2021-08-03 DIAGNOSIS — G4733 Obstructive sleep apnea (adult) (pediatric): Secondary | ICD-10-CM | POA: Diagnosis not present

## 2021-08-03 DIAGNOSIS — G4730 Sleep apnea, unspecified: Secondary | ICD-10-CM | POA: Diagnosis not present

## 2021-08-03 DIAGNOSIS — M810 Age-related osteoporosis without current pathological fracture: Secondary | ICD-10-CM | POA: Insufficient documentation

## 2021-08-03 DIAGNOSIS — I10 Essential (primary) hypertension: Secondary | ICD-10-CM | POA: Diagnosis not present

## 2021-08-03 DIAGNOSIS — I4891 Unspecified atrial fibrillation: Secondary | ICD-10-CM | POA: Insufficient documentation

## 2021-08-03 DIAGNOSIS — Z7901 Long term (current) use of anticoagulants: Secondary | ICD-10-CM | POA: Diagnosis not present

## 2021-08-03 DIAGNOSIS — Z79899 Other long term (current) drug therapy: Secondary | ICD-10-CM | POA: Diagnosis not present

## 2021-08-03 DIAGNOSIS — R000 Tachycardia, unspecified: Secondary | ICD-10-CM | POA: Insufficient documentation

## 2021-08-03 DIAGNOSIS — N189 Chronic kidney disease, unspecified: Secondary | ICD-10-CM | POA: Diagnosis not present

## 2021-08-03 DIAGNOSIS — F419 Anxiety disorder, unspecified: Secondary | ICD-10-CM | POA: Diagnosis not present

## 2021-08-03 NOTE — Telephone Encounter (Signed)
Spoke with the patient's nurse, Cyril Mourning, to inform her that Darylene Price, NP, was faxing an order to stop pt's potassium supplement. Informed nurse that the most recent set of December labwork their facility sent with patient to his appointment showed a potassium of 5.6. [facility] restated potassium value and verbalized understanding that potassium supplements should be stopped. Georg Ruddle, RN

## 2021-08-03 NOTE — Patient Instructions (Signed)
Continue weighing daily and call for an overnight weight gain of 3 pounds or more or a weekly weight gain of more than 5 pounds.  °

## 2021-08-03 NOTE — Progress Notes (Signed)
Patient ID: Matthew [name], male    DOB: Dec 06, 1950, 70 y.o.   MRN: [id number]  HPI  Matthew [name] is a 70 y/o male with a history of atrial fibrillation, asthma, CAD, HTN, CKD, anemia, anxiety, COPD, depression, gout, osteoporosis, sleep apnea and chronic heart failure.   Echo report from 06/13/21 reviewed and showed an EF of 50-55% along with mild LAE and trivial Matthew.   LHC done 10/17/2018 showed: Normal left ventricular function Normal coronaries  Admitted 07/11/21 due to foot pain. Found to be in AF RVR along with being anemic. Cardiology consult obtained. Initially given IV lasix but then stopped due to worsening renal function. Medication for afib started. Transfused 1 unit of PRBC's. Discharged after 7 days to SNF.   He presents today for a follow-up visit with a chief complaint of moderate shortness of breath with minimal exertion. He describes this as chronic in nature having been present for several years. He has associated fatigue, cough, intermittent abdominal pain, anxiety & dizziness along with this. He denies any difficulty sleeping, abdominal distention, palpitations, pedal edema or chest pain.   Says that he's still not getting weighed daily. Overall does feel a little better since he was last here.   Past Medical History:  Diagnosis Date   Anemia    Anxiety    Arrhythmia    atrial fibrillation   Asthma    CAD (coronary artery disease)    CHF (congestive heart failure) (HCC)    Chronic kidney disease    COPD (chronic obstructive pulmonary disease) (HCC)    Depression    Edema    Gout    Hip dislocation, bilateral ([place])    Hypertension    Osteoporosis    Sleep apnea    Past Surgical History:  Procedure Laterality Date   ABDOMINAL SURGERY     CHOLECYSTECTOMY     COLOSTOMY     COLOSTOMY TAKEDOWN     HERNIA REPAIR     LEFT HEART CATH AND CORONARY ANGIOGRAPHY N/A 10/17/2018   Procedure: LEFT HEART CATH AND CORONARY ANGIOGRAPHY with possible PCI and stent;  Surgeon:  Yolonda Kida, MD;  Location: [facility];  Service: Cardiovascular;  Laterality: N/A;   ORIF FEMUR FRACTURE Left 05/19/2020   Procedure: OPEN REDUCTION INTERNAL FIXATION (ORIF) DISTAL FEMUR FRACTURE;  Surgeon: Shona Needles, MD;  Location: [facility];  Service: Orthopedics;  Laterality: Left;   TOTAL HIP ARTHROPLASTY Bilateral    Family History  Problem Relation Age of Onset   COPD Mother    Cancer Mother    Melanoma Father    Social History   Tobacco Use   Smoking status: Never   Smokeless tobacco: Never  Substance Use Topics   Alcohol use: Not Currently   Allergies  Allergen Reactions   Demerol [Meperidine Hcl]    Lisinopril Other (See Comments)    Hypotensive    Meperidine And Related    Prior to Admission medications   Medication Sig Start Date End Date Taking? Authorizing Provider  acetaminophen (TYLENOL) 325 MG tablet Take 2 tablets (650 mg total) by mouth every 4 (four) hours as needed for headache or mild pain. 07/18/21  Yes Nicole Kindred A, DO  albuterol (VENTOLIN HFA) 108 (90 Base) MCG/ACT inhaler Inhale 2 puffs into the lungs every 6 (six) hours as needed for wheezing or shortness of breath.   Yes [provider]  allopurinol (ZYLOPRIM) 100 MG tablet Take 1 tablet (100 mg total) by mouth  daily. 07/18/21  Yes Nicole Kindred A, DO  apixaban (ELIQUIS) 5 MG TABS tablet Take 1 tablet (5 mg total) by mouth 2 (two) times daily. 07/23/21 10/21/21 Yes Carrie Mew, MD  bisacodyl (DULCOLAX) 5 MG EC tablet Take 1 tablet (5 mg total) by mouth daily as needed for moderate constipation. 06/23/21  Yes Richarda Osmond, MD  colchicine 0.6 MG tablet Take 0.5 tablets (0.3 mg total) by mouth daily. 06/24/21  Yes Richarda Osmond, MD  diltiazem (CARDIZEM CD) 300 MG 24 hr capsule Take 1 capsule (300 mg total) by mouth daily. 07/23/21 10/21/21 Yes Carrie Mew, MD  DULoxetine (CYMBALTA) 60 MG capsule Take 1 capsule (60 mg total) by mouth 2 (two) times daily.  06/15/21  Yes Masoud, Viann Shove, MD  furosemide (LASIX) 20 MG tablet Take 20 mg by mouth daily.   Yes [provider]  hydrOXYzine (ATARAX) 25 MG tablet Take 25 mg by mouth every 6 (six) hours as needed.   Yes [provider]  ipratropium-albuterol (DUONEB) 0.5-2.5 (3) MG/3ML SOLN Take 3 mLs by nebulization every 6 (six) hours as needed. 07/07/21  Yes Enzo Bi, MD  melatonin 5 MG TABS Take 1 tablet (5 mg total) by mouth at bedtime. 06/23/21  Yes Richarda Osmond, MD  metoprolol tartrate (LOPRESSOR) 50 MG tablet Take 1 tablet (50 mg total) by mouth 2 (two) times daily. 07/23/21 08/22/21 Yes Carrie Mew, MD  mirtazapine (REMERON) 15 MG tablet Take 15 mg by mouth at bedtime.   Yes [provider]  mometasone-formoterol (DULERA) 200-5 MCG/ACT AERO Inhale 2 puffs into the lungs 2 (two) times daily. 07/23/21  Yes Carrie Mew, MD  Multiple Vitamin (MULTIVITAMIN WITH MINERALS) TABS tablet Take 1 tablet by mouth daily. 07/07/21  Yes Enzo Bi, MD  polyethylene glycol (MIRALAX / GLYCOLAX) 17 g packet Take 17 g by mouth daily as needed for mild constipation. 06/23/21  Yes Richarda Osmond, MD  potassium chloride SA (KLOR-CON M) 20 MEQ tablet Take 20 mEq by mouth daily.   Yes [provider]  tamsulosin (FLOMAX) 0.4 MG CAPS capsule Take 1 capsule (0.4 mg total) by mouth daily after supper. 07/07/21 10/05/21 Yes Enzo Bi, MD  vitamin B-12 1000 MCG tablet Take 1 tablet (1,000 mcg total) by mouth daily. 07/19/21  Yes Nicole Kindred A, DO  HYDROcodone-acetaminophen (NORCO/VICODIN) 5-325 MG tablet Take 1 tablet by mouth every 6 (six) hours as needed for moderate pain or severe pain. Patient not taking: Reported on 08/03/2021 07/18/21 07/18/22  Ezekiel Slocumb, DO   Review of Systems  Constitutional:  Positive for fatigue. Negative for appetite change.  HENT:  Negative for congestion, postnasal drip and sore throat.   Eyes: Negative.   Respiratory:  Positive for  cough and shortness of breath. Negative for chest tightness.   Cardiovascular:  Negative for chest pain, palpitations and leg swelling.  Gastrointestinal:  Positive for abdominal pain. Negative for abdominal distention.  Endocrine: Negative.   Genitourinary: Negative.   Musculoskeletal:  Positive for arthralgias (left shoulder).  Skin:  Positive for wound.       Says that he has a wound on his "backside"  Allergic/Immunologic: Negative.   Neurological:  Positive for dizziness. Negative for light-headedness.  Hematological:  Negative for adenopathy. Bruises/bleeds easily.  Psychiatric/Behavioral:  Negative for dysphoric mood and sleep disturbance (trouble falling asleep; sleeping on 1 pillow). The patient is nervous/anxious.    Vitals:   08/03/21 1110  BP: 103/62  Pulse: (!) 101  Resp: 20  SpO2: 96%  Height: 6\' 2"  (1.88 m)   Wt Readings from Last 3 Encounters:  07/23/21 280 lb (127 kg)  07/18/21 (!) 304 lb 3.8 oz (138 kg)  07/06/21 295 lb 3.1 oz (133.9 kg)   Lab Results  Component Value Date   CREATININE 2.48 (H) 07/23/2021   CREATININE 2.54 (H) 07/18/2021   CREATININE 2.72 (H) 07/17/2021   Physical Exam Vitals and nursing note reviewed.  Constitutional:      Appearance: He is well-developed.  HENT:     Head: Normocephalic and atraumatic.  Neck:     Vascular: No JVD.  Cardiovascular:     Rate and Rhythm: Tachycardia present. Rhythm irregular.  Pulmonary:     Effort: Pulmonary effort is normal.     Breath sounds: Decreased breath sounds and rhonchi (throughout all lung fields) present. No wheezing or rales.  Abdominal:     General: There is no distension.     Palpations: Abdomen is soft.  Musculoskeletal:        General: No tenderness.     Cervical back: Normal range of motion and neck supple.     Right lower leg: No edema.     Left lower leg: No edema.  Skin:    General: Skin is warm and dry.  Neurological:     General: No focal deficit present.     Mental  Status: He is alert and oriented to person, place, and time.  Psychiatric:        Mood and Affect: Mood is anxious.        Behavior: Behavior normal.        Thought Content: Thought content normal.   Assessment & Plan:  1: Chronic heart failure with preserved ejection fraction with structural changes (LAE)- - NYHA class III - euvolemic today - not being weighed daily at [facility] and an order was written, again, for him to be weighed daily and to call for an overnight weight gain of > 2 pounds or a weekly weight gain of > 5 pounds - per [facility] weight chart, weight has ranged from 213-215 pounds when weighed - did not weigh patient in office because he was unsure if he could stand long enough - BP will not be able to tolerate entresto at this time - sees cardiology ([facility]) 08/30/21 - not adding salt to his food - wearing oxygen at 2L "whenever they put it on" - BNP 07/11/21 was 95.6  2: HTN with CKD- - BP looks good (103/62) - saw PCP Mauro Kaufmann) 12/16/20; now seeing PCP at SNF - BMP 07/27/21 reviewed and showed sodium 143, potassium 5.6, creatinine 2.32 and GFR 27.17 (from [facility]) - order faxed to stop potassium supplement; draw BMP 08/23/21 and fax results to [place] - nephrology referral placed at last visit   3: Atrial fibrillation- - [facility] cardiology to see patient Jan 2023 - tachy today at 37 - currently taking apixaban, diltiazem and metoprolol  4: Anemia- - hemoglobin 07/18/21 was 9.1  5: Anxiety- - appears under control at this time - he understands that his anxiety makes him more short of breath which makes him more anxious - currently taking duloxetine  6: Obstructive sleep apnea- - had been wearing CPAP but says that his home equipment is broke and he hasn't been able to find someone to fix it - facility says that patient will need a sleep study   Facility medication list reviewed.   Return in 2 months or sooner for any questions/problems before  then.

## 2021-08-04 DIAGNOSIS — I5033 Acute on chronic diastolic (congestive) heart failure: Secondary | ICD-10-CM | POA: Diagnosis not present

## 2021-08-04 DIAGNOSIS — G4733 Obstructive sleep apnea (adult) (pediatric): Secondary | ICD-10-CM | POA: Diagnosis not present

## 2021-08-04 DIAGNOSIS — J449 Chronic obstructive pulmonary disease, unspecified: Secondary | ICD-10-CM | POA: Diagnosis not present

## 2021-08-04 DIAGNOSIS — I482 Chronic atrial fibrillation, unspecified: Secondary | ICD-10-CM | POA: Diagnosis not present

## 2021-08-09 DIAGNOSIS — G4733 Obstructive sleep apnea (adult) (pediatric): Secondary | ICD-10-CM | POA: Diagnosis not present

## 2021-08-09 DIAGNOSIS — J449 Chronic obstructive pulmonary disease, unspecified: Secondary | ICD-10-CM | POA: Diagnosis not present

## 2021-08-09 DIAGNOSIS — I5033 Acute on chronic diastolic (congestive) heart failure: Secondary | ICD-10-CM | POA: Diagnosis not present

## 2021-08-10 DIAGNOSIS — J449 Chronic obstructive pulmonary disease, unspecified: Secondary | ICD-10-CM | POA: Diagnosis not present

## 2021-08-10 DIAGNOSIS — G4733 Obstructive sleep apnea (adult) (pediatric): Secondary | ICD-10-CM | POA: Diagnosis not present

## 2021-08-10 DIAGNOSIS — I5033 Acute on chronic diastolic (congestive) heart failure: Secondary | ICD-10-CM | POA: Diagnosis not present

## 2021-08-10 DIAGNOSIS — I482 Chronic atrial fibrillation, unspecified: Secondary | ICD-10-CM | POA: Diagnosis not present

## 2021-08-11 ENCOUNTER — Other Ambulatory Visit: Payer: Self-pay | Admitting: Family Medicine

## 2021-08-18 DIAGNOSIS — E46 Unspecified protein-calorie malnutrition: Secondary | ICD-10-CM | POA: Diagnosis not present

## 2021-08-18 DIAGNOSIS — G4733 Obstructive sleep apnea (adult) (pediatric): Secondary | ICD-10-CM | POA: Diagnosis not present

## 2021-08-18 DIAGNOSIS — I5033 Acute on chronic diastolic (congestive) heart failure: Secondary | ICD-10-CM | POA: Diagnosis not present

## 2021-08-18 DIAGNOSIS — N400 Enlarged prostate without lower urinary tract symptoms: Secondary | ICD-10-CM | POA: Diagnosis not present

## 2021-08-18 DIAGNOSIS — F32A Depression, unspecified: Secondary | ICD-10-CM | POA: Diagnosis not present

## 2021-08-18 DIAGNOSIS — I482 Chronic atrial fibrillation, unspecified: Secondary | ICD-10-CM | POA: Diagnosis not present

## 2021-08-18 DIAGNOSIS — M109 Gout, unspecified: Secondary | ICD-10-CM | POA: Diagnosis not present

## 2021-08-18 DIAGNOSIS — J449 Chronic obstructive pulmonary disease, unspecified: Secondary | ICD-10-CM | POA: Diagnosis not present

## 2021-08-18 DIAGNOSIS — D649 Anemia, unspecified: Secondary | ICD-10-CM | POA: Diagnosis not present

## 2021-08-23 DIAGNOSIS — I10 Essential (primary) hypertension: Secondary | ICD-10-CM | POA: Diagnosis not present

## 2021-08-24 ENCOUNTER — Telehealth: Payer: Self-pay

## 2021-08-24 ENCOUNTER — Telehealth: Payer: Self-pay | Admitting: Family

## 2021-08-24 DIAGNOSIS — J449 Chronic obstructive pulmonary disease, unspecified: Secondary | ICD-10-CM | POA: Diagnosis not present

## 2021-08-24 DIAGNOSIS — I5033 Acute on chronic diastolic (congestive) heart failure: Secondary | ICD-10-CM | POA: Diagnosis not present

## 2021-08-24 DIAGNOSIS — G4733 Obstructive sleep apnea (adult) (pediatric): Secondary | ICD-10-CM | POA: Diagnosis not present

## 2021-08-24 DIAGNOSIS — I482 Chronic atrial fibrillation, unspecified: Secondary | ICD-10-CM | POA: Diagnosis not present

## 2021-08-24 NOTE — Telephone Encounter (Signed)
Received lab results from [facility] obtained on 08/23/21:  Sodium 142 Potassium 5.3 Creatinine 2.18 BUN 35.1 GFR 29.28  Confirmed with nursing staff at [facility] that no potassium supplement is given. Nurse says that patient has also been given kaexylate. Recommended nephrology referral and nurse says that they will take care of this and make the nephrology appointment.

## 2021-08-24 NOTE — Telephone Encounter (Signed)
Called Pt's nurse at [facility] to report that potassium was 5.3 and asked to confirm that potassium supplements had been stopped. RN at [facility] restated lab value for this nurse and confirmed that potassium was discontinued on 07/27/21. Informed nurse that Darylene Price, FNP also advised that patient needs a nephrology appointment set up, and that we could set that up if they could not. RN stated they will make appointment, and that she will give message to [place], the patient's NP at [facility] to make appointment for nephrology.  Georg Ruddle, RN

## 2021-08-29 NOTE — Progress Notes (Deleted)
Cardiology Office Note    Date:  08/29/2021   ID:  CORDAY WYKA, DOB 1951-04-23, MRN [id number]  PCP:  [name], MD  Cardiologist:  Nelva Bush, MD  Electrophysiologist:  None   Chief Complaint: Hospital follow-up  History of Present Illness:   Matthew [name] is a 71 y.o. male with history of medication nonadherence, normal coronary arteries by LHC in 10/2018, HFpEF, persistent A. fib initially diagnosed in 05/2021, chronic hypoxic respiratory failure, COPD, CKD stage III-IV, macrocytic anemia, HTN, obesity, and OSA who presents for hospital follow-up as outlined below.  He was previously evaluated by [facility] cardiology in 2020.  Echo in 10/2018 demonstrated an EF of greater than 65%, no regional wall motion abnormalities, normal LV diastolic function parameters, normal RV systolic function and ventricular cavity size, and no significant valvular abnormalities.  LHC at that time showed normal coronary arteries.  More recently, in the fall 2022 he had several hospital admissions.  Initially in late 05/2021, for presumed COPD exacerbation and was found to be in newly documented A. fib with RVR.  He was treated by internal medicine for COPD exacerbation and volume overload with noted AKI.  High-sensitivity troponin peaked at 24.  Echo during that admission showed a low normal LV systolic function of 50 to 55%, no regional wall motion abnormalities, indeterminate LV diastolic function parameters, normal RV systolic function and ventricular cavity size, mildly dilated left atrium, trivial mitral regurgitation, and mild dilatation of the aortic root measuring 41 mm.  He was discharged on apixaban, aspirin, diltiazem, and Lopressor.  He was readmitted in late 06/2021 with acute on chronic hypoxic respiratory failure secondary to a COPD exacerbation.  EKG demonstrated A. fib with RVR.  High-sensitivity troponin normal x2.  CTA chest negative for PE.  There was some question of medication  adherence.  He was continued on same cardiac medications.  He was admitted for his second time in 06/2021 with pain in his foot and found to have acute gouty arthritis.  Hospital admission was complicated by A. fib with RVR, acute on chronic HFpEF, delirium, symptomatic anemia with a 4 g hemoglobin drop in 1 month's time, hyperkalemia requiring Lokelma, and acute on chronic kidney disease.  With regards to his diastolic heart failure and A. fib, formal cardiology consult was obtained with gentle IV diuresis undertaken that was ultimately discontinued due to AKI.  He was rate controlled with Cardizem and Lopressor with recommendation to pursue DCCV in the outpatient setting once he has been adequately anticoagulated.  Eliquis was briefly held due to hemoglobin drop, though resumed prior to discharge with stable hemoglobin.  The patient declined GI evaluation during the admission.  High-sensitivity troponin negative x2.  BNP 95.  Aspirin was discontinued.  From a cardiac perspective, he was discharged on apixaban 5 mg twice daily, diltiazem 300 mg daily, and Lopressor 50 mg twice daily.  He was seen in the ED on 07/23/2021 with chest pain and shortness of breath stating "I do not know why this keeps happening."  He admitted to not taking any medications following his discharge.  EKG demonstrated A. fib with RVR with rates in the 1 teens bpm.  High sensitive troponin negative x2.  Hemoglobin low though stable at 9.3.  He was given IV diltiazem injection and started on home medications including diltiazem and metoprolol with improvement in rates.  Medication compliance was encouraged.  ***   Labs independently reviewed: 07/2021 - BUN 35, serum creatinine 2.32, potassium 5.6, Hgb  9.3, PLT 219, albumin 2.3, magnesium 2.3 05/2021 - A1c 5.9, TC 101, TG 69, HDL 40, LDL 47, TSH normal, AST/ALT normal  Past Medical History:  Diagnosis Date   Anemia    Anxiety    Arrhythmia    atrial fibrillation   Asthma    CAD  (coronary artery disease)    CHF (congestive heart failure) (HCC)    Chronic kidney disease    COPD (chronic obstructive pulmonary disease) ([place])    Depression    Edema    Gout    Hip dislocation, bilateral (HCC)    Hypertension    Osteoporosis    Sleep apnea     Past Surgical History:  Procedure Laterality Date   ABDOMINAL SURGERY     CHOLECYSTECTOMY     COLOSTOMY     COLOSTOMY TAKEDOWN     HERNIA REPAIR     LEFT HEART CATH AND CORONARY ANGIOGRAPHY N/A 10/17/2018   Procedure: LEFT HEART CATH AND CORONARY ANGIOGRAPHY with possible PCI and stent;  Surgeon: Yolonda Kida, MD;  Location: [facility];  Service: Cardiovascular;  Laterality: N/A;   ORIF FEMUR FRACTURE Left 05/19/2020   Procedure: OPEN REDUCTION INTERNAL FIXATION (ORIF) DISTAL FEMUR FRACTURE;  Surgeon: Shona Needles, MD;  Location: [facility];  Service: Orthopedics;  Laterality: Left;   TOTAL HIP ARTHROPLASTY Bilateral     Current Medications: No outpatient medications have been marked as taking for the 08/30/21 encounter (Appointment) with Rise Mu, PA-C.    Allergies:   Demerol [meperidine hcl], Lisinopril, and Meperidine and related   Social History   Socioeconomic History   Marital status: Married    Spouse name: Not on file   Number of children: Not on file   Years of education: Not on file   Highest education level: Not on file  Occupational History   Not on file  Tobacco Use   Smoking status: Never   Smokeless tobacco: Never  Vaping Use   Vaping Use: Never used  Substance and Sexual Activity   Alcohol use: Not Currently   Drug use: Not on file   Sexual activity: Not on file  Other Topics Concern   Not on file  Social History Narrative   Not on file   Social Determinants of Health   Financial Resource Strain: Not on file  Food Insecurity: Not on file  Transportation Needs: Not on file  Physical Activity: Not on file  Stress: Not on file  Social Connections: Not on file      Family History:  The patient's family history includes COPD in his mother; Cancer in his mother; Melanoma in his father.  ROS:   ROS   EKGs/Labs/Other Studies Reviewed:    Studies reviewed were summarized above. The additional studies were reviewed today:  2D echo 06/13/2021: 1. AF with RVR precludes accurate EF assessment. Left ventricular  ejection fraction, by estimation, is 50 to 55%. The left ventricle has low  normal function. The left ventricle has no regional wall motion  abnormalities. Left ventricular diastolic  parameters are indeterminate.   2. Right ventricular systolic function is normal. The right ventricular  size is normal.   3. Left atrial size was mildly dilated.   4. The mitral valve is normal in structure. Trivial mitral valve  regurgitation.   5. The aortic valve is normal in structure. Aortic valve regurgitation is  not visualized. No aortic stenosis is present.   6. Aortic dilatation noted. There is mild  dilatation of the aortic root,  measuring 41 mm. __________  LHC 10/17/2018: Normal left ventricular function Normal coronaries   Conclusion Normal cardiac cath via right radial No evidence coronary disease Troponin represents demand ischemia not non-STEMI __________  2D echo 10/16/2018: 1. The left ventricle has hyperdynamic systolic function, with an  ejection fraction of >65%. The cavity size was normal. Left ventricular  diastolic parameters were normal No evidence of left ventricular regional  wall motion abnormalities.   2. The right ventricle has normal systolic function. The cavity was  normal. There is no increase in right ventricular wall thickness.   3. The mitral valve is normal in structure.   4. The tricuspid valve is normal in structure.   5. The aortic valve is normal in structure.   6. No evidence of left ventricular regional wall motion abnormalities.   7. The interatrial septum was not assessed.    EKG:  EKG is ordered  today.  The EKG ordered today demonstrates ***  Recent Labs: 06/13/2021: TSH 1.783 07/11/2021: B Natriuretic Peptide 95.6 07/12/2021: ALT 26 07/18/2021: Magnesium 2.3 07/23/2021: BUN 39; Creatinine, Ser 2.48; Hemoglobin 9.3; Platelets 219; Potassium 5.3; Sodium 137  Recent Lipid Panel    Component Value Date/Time   CHOL 101 06/13/2021 0319   CHOL 108 09/22/2012 0227   TRIG 69 06/13/2021 0319   TRIG 54 09/22/2012 0227   HDL 40 (L) 06/13/2021 0319   HDL 43 09/22/2012 0227   CHOLHDL 2.5 06/13/2021 0319   VLDL 14 06/13/2021 0319   VLDL 11 09/22/2012 0227   LDLCALC 47 06/13/2021 0319   LDLCALC 54 09/22/2012 0227    PHYSICAL EXAM:    VS:  There were no vitals taken for this visit.  BMI: There is no height or weight on file to calculate BMI.  Physical Exam  Wt Readings from Last 3 Encounters:  07/23/21 280 lb (127 kg)  07/18/21 (!) 304 lb 3.8 oz (138 kg)  07/06/21 295 lb 3.1 oz (133.9 kg)     ASSESSMENT & PLAN:   HFpEF:  Persistent A. fib:  Medication nonadherence:  CKD stage III-IV:  HTN: Blood pressure ***  Macrocytic anemia:  Chronic hypoxic respiratory failure/COPD:  OSA:   {Are you ordering a CV Procedure (e.g. stress test, cath, DCCV, TEE, etc)?   Press F2        :[id number]}     Disposition: F/u with Dr. Saunders Revel or an APP in ***.   Medication Adjustments/Labs and Tests Ordered: Current medicines are reviewed at length with the patient today.  Concerns regarding medicines are outlined above. Medication changes, Labs and Tests ordered today are summarized above and listed in the Patient Instructions accessible in Encounters.   Signed, Christell Faith, PA-C 08/29/2021 9:11 AM     [address] [place] [address], [place] [address] [phone]

## 2021-08-30 ENCOUNTER — Ambulatory Visit: Payer: Medicare HMO | Admitting: Physician Assistant

## 2021-08-31 ENCOUNTER — Encounter: Payer: Self-pay | Admitting: Physician Assistant

## 2021-09-07 ENCOUNTER — Ambulatory Visit: Payer: Medicare HMO | Admitting: Physician Assistant

## 2021-09-12 ENCOUNTER — Other Ambulatory Visit: Payer: Self-pay

## 2021-09-12 MED ORDER — FUROSEMIDE 20 MG PO TABS: 20.0000 mg | ORAL_TABLET | Freq: Every day | ORAL | 0 refills | Status: DC

## 2021-09-12 MED ORDER — APIXABAN 5 MG PO TABS: 5.0000 mg | ORAL_TABLET | Freq: Two times a day (BID) | ORAL | 0 refills | Status: DC

## 2021-09-13 ENCOUNTER — Ambulatory Visit: Payer: Medicare HMO | Admitting: Internal Medicine

## 2021-09-14 ENCOUNTER — Encounter: Payer: Self-pay | Admitting: Internal Medicine

## 2021-09-14 ENCOUNTER — Other Ambulatory Visit: Payer: Self-pay

## 2021-09-14 ENCOUNTER — Ambulatory Visit (INDEPENDENT_AMBULATORY_CARE_PROVIDER_SITE_OTHER): Payer: Medicare HMO | Admitting: Internal Medicine

## 2021-09-14 VITALS — BP 119/72 | HR 56 | Ht 74.0 in | Wt 285.0 lb

## 2021-09-14 DIAGNOSIS — J9601 Acute respiratory failure with hypoxia: Secondary | ICD-10-CM | POA: Diagnosis not present

## 2021-09-14 DIAGNOSIS — M1A3690 Chronic gout due to renal impairment, unspecified knee, without tophus (tophi): Secondary | ICD-10-CM | POA: Diagnosis not present

## 2021-09-14 DIAGNOSIS — F329 Major depressive disorder, single episode, unspecified: Secondary | ICD-10-CM | POA: Diagnosis not present

## 2021-09-14 DIAGNOSIS — J441 Chronic obstructive pulmonary disease with (acute) exacerbation: Secondary | ICD-10-CM | POA: Diagnosis not present

## 2021-09-14 DIAGNOSIS — J45901 Unspecified asthma with (acute) exacerbation: Secondary | ICD-10-CM | POA: Diagnosis not present

## 2021-09-14 DIAGNOSIS — N1832 Chronic kidney disease, stage 3b: Secondary | ICD-10-CM | POA: Diagnosis not present

## 2021-09-14 DIAGNOSIS — R3911 Hesitancy of micturition: Secondary | ICD-10-CM

## 2021-09-14 DIAGNOSIS — E669 Obesity, unspecified: Secondary | ICD-10-CM

## 2021-09-14 DIAGNOSIS — I5033 Acute on chronic diastolic (congestive) heart failure: Secondary | ICD-10-CM

## 2021-09-14 DIAGNOSIS — I10 Essential (primary) hypertension: Secondary | ICD-10-CM

## 2021-09-14 DIAGNOSIS — J4540 Moderate persistent asthma, uncomplicated: Secondary | ICD-10-CM | POA: Diagnosis not present

## 2021-09-14 DIAGNOSIS — I4891 Unspecified atrial fibrillation: Secondary | ICD-10-CM

## 2021-09-14 DIAGNOSIS — J4541 Moderate persistent asthma with (acute) exacerbation: Secondary | ICD-10-CM

## 2021-09-14 DIAGNOSIS — N401 Enlarged prostate with lower urinary tract symptoms: Secondary | ICD-10-CM

## 2021-09-14 DIAGNOSIS — I5031 Acute diastolic (congestive) heart failure: Secondary | ICD-10-CM | POA: Diagnosis not present

## 2021-09-14 MED ORDER — DULOXETINE HCL 60 MG PO CPEP: 60.0000 mg | ORAL_CAPSULE | Freq: Two times a day (BID) | ORAL | 3 refills | Status: DC

## 2021-09-14 MED ORDER — ALBUTEROL SULFATE HFA 108 (90 BASE) MCG/ACT IN AERS: 2.0000 | INHALATION_SPRAY | Freq: Four times a day (QID) | RESPIRATORY_TRACT | 3 refills | Status: DC | PRN

## 2021-09-14 MED ORDER — ALLOPURINOL 100 MG PO TABS: 100.0000 mg | ORAL_TABLET | Freq: Every day | ORAL | 3 refills | Status: DC

## 2021-09-14 MED ORDER — TAMSULOSIN HCL 0.4 MG PO CAPS: 0.4000 mg | ORAL_CAPSULE | Freq: Every day | ORAL | 2 refills | Status: DC

## 2021-09-14 MED ORDER — MIRTAZAPINE 15 MG PO TABS: 15.0000 mg | ORAL_TABLET | Freq: Every day | ORAL | 1 refills | Status: DC

## 2021-09-14 MED ORDER — BISACODYL 5 MG PO TBEC: 5.0000 mg | DELAYED_RELEASE_TABLET | Freq: Every day | ORAL | 3 refills | Status: AC | PRN

## 2021-09-14 MED ORDER — MOMETASONE FURO-FORMOTEROL FUM 200-5 MCG/ACT IN AERO: 2.0000 | INHALATION_SPRAY | Freq: Two times a day (BID) | RESPIRATORY_TRACT | 2 refills | Status: DC

## 2021-09-14 MED ORDER — FUROSEMIDE 20 MG PO TABS: 20.0000 mg | ORAL_TABLET | Freq: Every day | ORAL | 3 refills | Status: DC

## 2021-09-14 MED ORDER — DILTIAZEM HCL ER COATED BEADS 300 MG PO CP24: 300.0000 mg | ORAL_CAPSULE | Freq: Every day | ORAL | 2 refills | Status: DC

## 2021-09-14 MED ORDER — METOPROLOL TARTRATE 50 MG PO TABS: 50.0000 mg | ORAL_TABLET | Freq: Two times a day (BID) | ORAL | 3 refills | Status: DC

## 2021-09-14 MED ORDER — APIXABAN 5 MG PO TABS: 5.0000 mg | ORAL_TABLET | Freq: Two times a day (BID) | ORAL | 3 refills | Status: DC

## 2021-09-14 MED ORDER — IPRATROPIUM-ALBUTEROL 0.5-2.5 (3) MG/3ML IN SOLN: 3.0000 mL | Freq: Four times a day (QID) | RESPIRATORY_TRACT | 2 refills | Status: DC | PRN

## 2021-09-14 NOTE — Assessment & Plan Note (Signed)
Stable

## 2021-09-14 NOTE — Assessment & Plan Note (Signed)
BP is under control.

## 2021-09-14 NOTE — Assessment & Plan Note (Signed)
No change 

## 2021-09-14 NOTE — Progress Notes (Signed)
New Patient Office Visit  Subjective:  Patient ID: Matthew [name], male    DOB: [birth year]  Age: 71 y.o. MRN: [id number]  CC:  Chief Complaint  Patient presents with   General check up    Patient is here for a general check up. Patient reports he needs refills on all of his medications. Matthew [name] has been in and out of the hospital as well as [facility] over the last several months. Matthew [name] needs a referral for home health and physical therapy.     HPI Patient presents for general medical evaluation. He was seen in the car. Pt was discharged from the nursing home to the house. He does not have home health nurse therapy. Pt can not walk, he was treated in the hospital for Congestive heart failure. He denied any chest pain or shortness of breath.    Past Medical History:  Diagnosis Date   Anemia    Anxiety    Arrhythmia    atrial fibrillation   Asthma    CAD (coronary artery disease)    CHF (congestive heart failure) (HCC)    Chronic kidney disease    COPD (chronic obstructive pulmonary disease) (HCC)    Depression    Edema    Gout    Hip dislocation, bilateral (HCC)    Hypertension    Osteoporosis    Sleep apnea      Current Outpatient Medications:    acetaminophen (TYLENOL) 325 MG tablet, Take 2 tablets (650 mg total) by mouth every 4 (four) hours as needed for headache or mild pain., Disp: , Rfl:    colchicine 0.6 MG tablet, Take 0.5 tablets (0.3 mg total) by mouth daily., Disp: , Rfl:    hydrOXYzine (ATARAX) 25 MG tablet, Take 25 mg by mouth every 6 (six) hours as needed., Disp: , Rfl:    melatonin 5 MG TABS, Take 1 tablet (5 mg total) by mouth at bedtime., Disp: , Rfl: 0   Multiple Vitamin (MULTIVITAMIN WITH MINERALS) TABS tablet, Take 1 tablet by mouth daily., Disp: , Rfl:    polyethylene glycol (MIRALAX / GLYCOLAX) 17 g packet, Take 17 g by mouth daily as needed for mild constipation., Disp: 14 each, Rfl: 0   potassium chloride SA (KLOR-CON M) 20 MEQ tablet,  Take 20 mEq by mouth daily., Disp: , Rfl:    vitamin B-12 1000 MCG tablet, Take 1 tablet (1,000 mcg total) by mouth daily., Disp: , Rfl:    albuterol (VENTOLIN HFA) 108 (90 Base) MCG/ACT inhaler, Inhale 2 puffs into the lungs every 6 (six) hours as needed for wheezing or shortness of breath., Disp: 18 g, Rfl: 3   allopurinol (ZYLOPRIM) 100 MG tablet, Take 1 tablet (100 mg total) by mouth daily., Disp: 30 tablet, Rfl: 3   apixaban (ELIQUIS) 5 MG TABS tablet, Take 1 tablet (5 mg total) by mouth 2 (two) times daily., Disp: 30 tablet, Rfl: 3   bisacodyl (DULCOLAX) 5 MG EC tablet, Take 1 tablet (5 mg total) by mouth daily as needed for moderate constipation., Disp: 30 tablet, Rfl: 3   diltiazem (CARDIZEM CD) 300 MG 24 hr capsule, Take 1 capsule (300 mg total) by mouth daily., Disp: 90 capsule, Rfl: 2   DULoxetine (CYMBALTA) 60 MG capsule, Take 1 capsule (60 mg total) by mouth 2 (two) times daily., Disp: 60 capsule, Rfl: 3   furosemide (LASIX) 20 MG tablet, Take 1 tablet (20 mg total) by mouth daily., Disp: 30 tablet, Rfl: 3  ipratropium-albuterol (DUONEB) 0.5-2.5 (3) MG/3ML SOLN, Take 3 mLs by nebulization every 6 (six) hours as needed., Disp: 120 mL, Rfl: 2   metoprolol tartrate (LOPRESSOR) 50 MG tablet, Take 1 tablet (50 mg total) by mouth 2 (two) times daily., Disp: 60 tablet, Rfl: 3   mirtazapine (REMERON) 15 MG tablet, Take 1 tablet (15 mg total) by mouth at bedtime., Disp: 90 tablet, Rfl: 1   mometasone-formoterol (DULERA) 200-5 MCG/ACT AERO, Inhale 2 puffs into the lungs 2 (two) times daily., Disp: 1 each, Rfl: 2   tamsulosin (FLOMAX) 0.4 MG CAPS capsule, Take 1 capsule (0.4 mg total) by mouth daily after supper., Disp: 90 capsule, Rfl: 2   Past Surgical History:  Procedure Laterality Date   ABDOMINAL SURGERY     CHOLECYSTECTOMY     COLOSTOMY     COLOSTOMY TAKEDOWN     HERNIA REPAIR     LEFT HEART CATH AND CORONARY ANGIOGRAPHY N/A 10/17/2018   Procedure: LEFT HEART CATH AND CORONARY  ANGIOGRAPHY with possible PCI and stent;  Surgeon: Yolonda Kida, MD;  Location: [place] [facility];  Service: Cardiovascular;  Laterality: N/A;   ORIF FEMUR FRACTURE Left 05/19/2020   Procedure: OPEN REDUCTION INTERNAL FIXATION (ORIF) DISTAL FEMUR FRACTURE;  Surgeon: Shona Needles, MD;  Location: [place];  Service: Orthopedics;  Laterality: Left;   TOTAL HIP ARTHROPLASTY Bilateral     Family History  Problem Relation Age of Onset   COPD Mother    Cancer Mother    Melanoma Father     Social History   Socioeconomic History   Marital status: Married    Spouse name: Not on file   Number of children: Not on file   Years of education: Not on file   Highest education level: Not on file  Occupational History   Not on file  Tobacco Use   Smoking status: Never   Smokeless tobacco: Never  Vaping Use   Vaping Use: Never used  Substance and Sexual Activity   Alcohol use: Not Currently   Drug use: Not on file   Sexual activity: Not on file  Other Topics Concern   Not on file  Social History Narrative   Not on file   Social Determinants of Health   Financial Resource Strain: Not on file  Food Insecurity: Not on file  Transportation Needs: Not on file  Physical Activity: Not on file  Stress: Not on file  Social Connections: Not on file  Intimate Partner Violence: Not on file    ROS Review of Systems  Constitutional:  Negative for chills, fatigue and fever.  HENT:  Negative for congestion, sneezing and sore throat.   Respiratory:  Positive for shortness of breath. Negative for cough, chest tightness and wheezing.   Cardiovascular:  Negative for chest pain.  Gastrointestinal:  Positive for abdominal distention. Negative for blood in stool.  Musculoskeletal:        Pt is wheel chair bound. Chest decreased breath sounds with no ronchi. Heart is regular. Abd is soft non tender. +1 pedal edema.   Neurological:  Negative for seizures and speech difficulty.   Psychiatric/Behavioral:  Negative for agitation, behavioral problems, confusion, dysphoric mood and hallucinations. The patient is not hyperactive.    Objective:   Today's Vitals: BP 119/72    Pulse (!) 56    Ht 6\' 2"  (1.88 m)    Wt 285 lb (129.3 kg) Comment: This is the previous wt, pt is unable to stand to weigh today.  SpO2 97% Comment: Pt on 4L   BMI 36.59 kg/m   Physical Exam Constitutional:      Appearance: Normal appearance.  HENT:     Head: Normocephalic.     Nose: Nose normal.  Eyes:     Pupils: Pupils are equal, round, and reactive to light.  Cardiovascular:     Rate and Rhythm: Normal rate and regular rhythm.  Pulmonary:     Effort: Pulmonary effort is normal.  Abdominal:     General: Bowel sounds are normal.  Musculoskeletal:     Cervical back: Normal range of motion.     Comments: Pt is wheel chair bound. Has trouble getting out of the bed or chair.   Neurological:     Mental Status: He is alert.    Assessment & Plan:   Problem List Items Addressed This Visit       Cardiovascular and Mediastinum   Essential hypertension - Primary    BP is under control.       Relevant Medications   apixaban (ELIQUIS) 5 MG TABS tablet   diltiazem (CARDIZEM CD) 300 MG 24 hr capsule   furosemide (LASIX) 20 MG tablet   metoprolol tartrate (LOPRESSOR) 50 MG tablet   Atrial fibrillation with RVR (HCC)    Stable      Relevant Medications   apixaban (ELIQUIS) 5 MG TABS tablet   diltiazem (CARDIZEM CD) 300 MG 24 hr capsule   furosemide (LASIX) 20 MG tablet   metoprolol tartrate (LOPRESSOR) 50 MG tablet   Heart failure with preserved ejection fraction (HCC)    stable      Relevant Medications   apixaban (ELIQUIS) 5 MG TABS tablet   diltiazem (CARDIZEM CD) 300 MG 24 hr capsule   furosemide (LASIX) 20 MG tablet   metoprolol tartrate (LOPRESSOR) 50 MG tablet     Respiratory   Moderate persistent asthma with exacerbation    No wheezing was noted.       Relevant  Medications   albuterol (VENTOLIN HFA) 108 (90 Base) MCG/ACT inhaler   ipratropium-albuterol (DUONEB) 0.5-2.5 (3) MG/3ML SOLN   mometasone-formoterol (DULERA) 200-5 MCG/ACT AERO     Genitourinary   Stage 3b chronic kidney disease (HCC)    No change        Other   Obesity (BMI 30-39.9)    Pt advised to lose weight.        Outpatient Encounter Medications as of 09/14/2021  Medication Sig   acetaminophen (TYLENOL) 325 MG tablet Take 2 tablets (650 mg total) by mouth every 4 (four) hours as needed for headache or mild pain.   colchicine 0.6 MG tablet Take 0.5 tablets (0.3 mg total) by mouth daily.   hydrOXYzine (ATARAX) 25 MG tablet Take 25 mg by mouth every 6 (six) hours as needed.   melatonin 5 MG TABS Take 1 tablet (5 mg total) by mouth at bedtime.   Multiple Vitamin (MULTIVITAMIN WITH MINERALS) TABS tablet Take 1 tablet by mouth daily.   polyethylene glycol (MIRALAX / GLYCOLAX) 17 g packet Take 17 g by mouth daily as needed for mild constipation.   potassium chloride SA (KLOR-CON M) 20 MEQ tablet Take 20 mEq by mouth daily.   vitamin B-12 1000 MCG tablet Take 1 tablet (1,000 mcg total) by mouth daily.   [DISCONTINUED] allopurinol (ZYLOPRIM) 100 MG tablet Take 1 tablet (100 mg total) by mouth daily.   [DISCONTINUED] apixaban (ELIQUIS) 5 MG TABS tablet Take 1 tablet (5 mg total) by mouth 2 (  two) times daily.   [DISCONTINUED] bisacodyl (DULCOLAX) 5 MG EC tablet Take 1 tablet (5 mg total) by mouth daily as needed for moderate constipation.   [DISCONTINUED] diltiazem (CARDIZEM CD) 300 MG 24 hr capsule Take 1 capsule (300 mg total) by mouth daily.   [DISCONTINUED] furosemide (LASIX) 20 MG tablet Take 1 tablet (20 mg total) by mouth daily.   [DISCONTINUED] ipratropium-albuterol (DUONEB) 0.5-2.5 (3) MG/3ML SOLN Take 3 mLs by nebulization every 6 (six) hours as needed.   [DISCONTINUED] mirtazapine (REMERON) 15 MG tablet Take 15 mg by mouth at bedtime.   [DISCONTINUED] mometasone-formoterol  (DULERA) 200-5 MCG/ACT AERO Inhale 2 puffs into the lungs 2 (two) times daily.   [DISCONTINUED] tamsulosin (FLOMAX) 0.4 MG CAPS capsule Take 1 capsule (0.4 mg total) by mouth daily after supper.   albuterol (VENTOLIN HFA) 108 (90 Base) MCG/ACT inhaler Inhale 2 puffs into the lungs every 6 (six) hours as needed for wheezing or shortness of breath.   allopurinol (ZYLOPRIM) 100 MG tablet Take 1 tablet (100 mg total) by mouth daily.   apixaban (ELIQUIS) 5 MG TABS tablet Take 1 tablet (5 mg total) by mouth 2 (two) times daily.   bisacodyl (DULCOLAX) 5 MG EC tablet Take 1 tablet (5 mg total) by mouth daily as needed for moderate constipation.   diltiazem (CARDIZEM CD) 300 MG 24 hr capsule Take 1 capsule (300 mg total) by mouth daily.   DULoxetine (CYMBALTA) 60 MG capsule Take 1 capsule (60 mg total) by mouth 2 (two) times daily.   furosemide (LASIX) 20 MG tablet Take 1 tablet (20 mg total) by mouth daily.   ipratropium-albuterol (DUONEB) 0.5-2.5 (3) MG/3ML SOLN Take 3 mLs by nebulization every 6 (six) hours as needed.   metoprolol tartrate (LOPRESSOR) 50 MG tablet Take 1 tablet (50 mg total) by mouth 2 (two) times daily.   mirtazapine (REMERON) 15 MG tablet Take 1 tablet (15 mg total) by mouth at bedtime.   mometasone-formoterol (DULERA) 200-5 MCG/ACT AERO Inhale 2 puffs into the lungs 2 (two) times daily.   tamsulosin (FLOMAX) 0.4 MG CAPS capsule Take 1 capsule (0.4 mg total) by mouth daily after supper.   [DISCONTINUED] albuterol (VENTOLIN HFA) 108 (90 Base) MCG/ACT inhaler Inhale 2 puffs into the lungs every 6 (six) hours as needed for wheezing or shortness of breath.   [DISCONTINUED] DULoxetine (CYMBALTA) 60 MG capsule Take 1 capsule (60 mg total) by mouth 2 (two) times daily.   [DISCONTINUED] HYDROcodone-acetaminophen (NORCO/VICODIN) 5-325 MG tablet Take 1 tablet by mouth every 6 (six) hours as needed for moderate pain or severe pain. (Patient not taking: Reported on 08/03/2021)   [DISCONTINUED]  metoprolol tartrate (LOPRESSOR) 50 MG tablet Take 1 tablet (50 mg total) by mouth 2 (two) times daily.   No facility-administered encounter medications on file as of 09/14/2021.    Follow-up: No follow-ups on file.   [name], MD

## 2021-09-14 NOTE — Assessment & Plan Note (Signed)
Pt advised to lose weight.

## 2021-09-14 NOTE — Assessment & Plan Note (Signed)
stable °

## 2021-09-14 NOTE — Assessment & Plan Note (Signed)
No wheezing was noted 

## 2021-09-20 ENCOUNTER — Other Ambulatory Visit: Payer: Self-pay | Admitting: Internal Medicine

## 2021-09-20 DIAGNOSIS — N1832 Chronic kidney disease, stage 3b: Secondary | ICD-10-CM

## 2021-09-20 DIAGNOSIS — I5033 Acute on chronic diastolic (congestive) heart failure: Secondary | ICD-10-CM

## 2021-09-22 ENCOUNTER — Other Ambulatory Visit: Payer: Self-pay | Admitting: Internal Medicine

## 2021-09-22 DIAGNOSIS — I4891 Unspecified atrial fibrillation: Secondary | ICD-10-CM

## 2021-09-26 ENCOUNTER — Other Ambulatory Visit: Payer: Self-pay

## 2021-09-28 ENCOUNTER — Other Ambulatory Visit: Payer: Self-pay | Admitting: Internal Medicine

## 2021-09-28 DIAGNOSIS — J4540 Moderate persistent asthma, uncomplicated: Secondary | ICD-10-CM

## 2021-10-04 NOTE — Progress Notes (Unsigned)
Patient ID: Matthew [name], male    DOB: 10/22/1950, 71 y.o.   MRN: [id number]  HPI  Mr Hagemann is a 71 y/o male with a history of atrial fibrillation, asthma, CAD, HTN, CKD, anemia, anxiety, COPD, depression, gout, osteoporosis, sleep apnea and chronic heart failure.   Echo report from 06/13/21 reviewed and showed an EF of 50-55% along with mild LAE and trivial MR.   LHC done 10/17/2018 showed: Normal left ventricular function Normal coronaries  Admitted 07/11/21 due to foot pain. Found to be in AF RVR along with being anemic. Cardiology consult obtained. Initially given IV lasix but then stopped due to worsening renal function. Medication for afib started. Transfused 1 unit of PRBC's. Discharged after 7 days to SNF.   He presents today for a follow-up visit with a chief complaint of   Past Medical History:  Diagnosis Date   Anemia    Anxiety    Arrhythmia    atrial fibrillation   Asthma    CAD (coronary artery disease)    CHF (congestive heart failure) (HCC)    Chronic kidney disease    COPD (chronic obstructive pulmonary disease) (HCC)    Depression    Edema    Gout    Hip dislocation, bilateral ([place])    Hypertension    Osteoporosis    Sleep apnea    Past Surgical History:  Procedure Laterality Date   ABDOMINAL SURGERY     CHOLECYSTECTOMY     COLOSTOMY     COLOSTOMY TAKEDOWN     HERNIA REPAIR     LEFT HEART CATH AND CORONARY ANGIOGRAPHY N/A 10/17/2018   Procedure: LEFT HEART CATH AND CORONARY ANGIOGRAPHY with possible PCI and stent;  Surgeon: Yolonda Kida, MD;  Location: [facility];  Service: Cardiovascular;  Laterality: N/A;   ORIF FEMUR FRACTURE Left 05/19/2020   Procedure: OPEN REDUCTION INTERNAL FIXATION (ORIF) DISTAL FEMUR FRACTURE;  Surgeon: Shona Needles, MD;  Location: [facility];  Service: Orthopedics;  Laterality: Left;   TOTAL HIP ARTHROPLASTY Bilateral    Family History  Problem Relation Age of Onset   COPD Mother    Cancer Mother    Melanoma  Father    Social History   Tobacco Use   Smoking status: Never   Smokeless tobacco: Never  Substance Use Topics   Alcohol use: Not Currently   Allergies  Allergen Reactions   Demerol [Meperidine Hcl]    Lisinopril Other (See Comments)    Hypotensive    Meperidine And Related     Review of Systems  Constitutional:  Positive for fatigue. Negative for appetite change.  HENT:  Negative for congestion, postnasal drip and sore throat.   Eyes: Negative.   Respiratory:  Positive for cough and shortness of breath. Negative for chest tightness.   Cardiovascular:  Negative for chest pain, palpitations and leg swelling.  Gastrointestinal:  Positive for abdominal pain. Negative for abdominal distention.  Endocrine: Negative.   Genitourinary: Negative.   Musculoskeletal:  Positive for arthralgias (left shoulder).  Skin:  Positive for wound.       Says that he has a wound on his "backside"  Allergic/Immunologic: Negative.   Neurological:  Positive for dizziness. Negative for light-headedness.  Hematological:  Negative for adenopathy. Bruises/bleeds easily.  Psychiatric/Behavioral:  Negative for dysphoric mood and sleep disturbance (trouble falling asleep; sleeping on 1 pillow). The patient is nervous/anxious.      Physical Exam Vitals and nursing note reviewed.  Constitutional:  Appearance: He is well-developed.  HENT:     Head: Normocephalic and atraumatic.  Neck:     Vascular: No JVD.  Cardiovascular:     Rate and Rhythm: Tachycardia present. Rhythm irregular.  Pulmonary:     Effort: Pulmonary effort is normal.     Breath sounds: Decreased breath sounds and rhonchi (throughout all lung fields) present. No wheezing or rales.  Abdominal:     General: There is no distension.     Palpations: Abdomen is soft.  Musculoskeletal:        General: No tenderness.     Cervical back: Normal range of motion and neck supple.     Right lower leg: No edema.     Left lower leg: No  edema.  Skin:    General: Skin is warm and dry.  Neurological:     General: No focal deficit present.     Mental Status: He is alert and oriented to person, place, and time.  Psychiatric:        Mood and Affect: Mood is anxious.        Behavior: Behavior normal.        Thought Content: Thought content normal.   Assessment & Plan:  1: Chronic heart failure with preserved ejection fraction with structural changes (LAE)- - NYHA class III - euvolemic today - not being weighed daily at [facility] and an order was written, again, for him to be weighed daily and to call for an overnight weight gain of > 2 pounds or a weekly weight gain of > 5 pounds - per [facility] weight chart, weight has ranged from 213-215 pounds when weighed - did not weigh patient in office because he was unsure if he could stand long enough - BP will not be able to tolerate entresto at this time - sees cardiology ([facility]) 08/30/21 - not adding salt to his food - wearing oxygen at 2L "whenever they put it on" - BNP 07/11/21 was 95.6  2: HTN with CKD- - BP  - saw PCP ([facility]) 09/14/21 - BMP 07/27/21 reviewed and showed sodium 143, potassium 5.6, creatinine 2.32 and GFR 27.17 (from [facility]) - order faxed to stop potassium supplement; draw BMP 08/23/21 and fax results to [place] - nephrology referral placed at last visit   3: Atrial fibrillation- - NS for [facility] cardiology Jan 2023 - currently taking apixaban, diltiazem and metoprolol  4: Anemia- - hemoglobin 07/18/21 was 9.1  5: Anxiety- - appears under control at this time - he understands that his anxiety makes him more short of breath which makes him more anxious - currently taking duloxetine  6: Obstructive sleep apnea- - had been wearing CPAP but says that his home equipment is broke and he hasn't been able to find someone to fix it - facility says that patient will need a sleep study   Facility medication list reviewed.

## 2021-10-05 ENCOUNTER — Telehealth: Payer: Self-pay | Admitting: Family

## 2021-10-05 ENCOUNTER — Ambulatory Visit: Payer: Medicare HMO | Admitting: Family

## 2021-10-05 NOTE — Telephone Encounter (Signed)
Patient did not show for his [facility] appointment on 10/05/21. Will attempt to reschedule.   °

## 2021-10-07 ENCOUNTER — Telehealth: Payer: Self-pay | Admitting: Family

## 2021-10-07 NOTE — Telephone Encounter (Signed)
LVM with patient in attempt to reschedule a no show appointment    Ember Gottwald, NT

## 2021-10-13 ENCOUNTER — Other Ambulatory Visit: Payer: Self-pay | Admitting: Internal Medicine

## 2021-10-13 DIAGNOSIS — J4540 Moderate persistent asthma, uncomplicated: Secondary | ICD-10-CM

## 2021-10-17 ENCOUNTER — Emergency Department: Payer: Medicare HMO

## 2021-10-17 ENCOUNTER — Inpatient Hospital Stay
Admission: EM | Admit: 2021-10-17 | Discharge: 2021-10-29 | DRG: 308 | Disposition: A | Discharge status: Home Health | Payer: Medicare HMO | Attending: Family Medicine | Admitting: Family Medicine

## 2021-10-17 ENCOUNTER — Other Ambulatory Visit: Payer: Self-pay

## 2021-10-17 DIAGNOSIS — I5023 Acute on chronic systolic (congestive) heart failure: Secondary | ICD-10-CM | POA: Diagnosis not present

## 2021-10-17 DIAGNOSIS — F329 Major depressive disorder, single episode, unspecified: Secondary | ICD-10-CM | POA: Diagnosis present

## 2021-10-17 DIAGNOSIS — Z7401 Bed confinement status: Secondary | ICD-10-CM | POA: Diagnosis not present

## 2021-10-17 DIAGNOSIS — Z79899 Other long term (current) drug therapy: Secondary | ICD-10-CM

## 2021-10-17 DIAGNOSIS — Z20822 Contact with and (suspected) exposure to covid-19: Secondary | ICD-10-CM | POA: Diagnosis not present

## 2021-10-17 DIAGNOSIS — Z888 Allergy status to other drugs, medicaments and biological substances status: Secondary | ICD-10-CM

## 2021-10-17 DIAGNOSIS — R000 Tachycardia, unspecified: Secondary | ICD-10-CM | POA: Diagnosis not present

## 2021-10-17 DIAGNOSIS — Z6835 Body mass index (BMI) 35.0-35.9, adult: Secondary | ICD-10-CM

## 2021-10-17 DIAGNOSIS — I5033 Acute on chronic diastolic (congestive) heart failure: Secondary | ICD-10-CM | POA: Diagnosis not present

## 2021-10-17 DIAGNOSIS — I420 Dilated cardiomyopathy: Secondary | ICD-10-CM

## 2021-10-17 DIAGNOSIS — I5022 Chronic systolic (congestive) heart failure: Secondary | ICD-10-CM | POA: Diagnosis not present

## 2021-10-17 DIAGNOSIS — Z9981 Dependence on supplemental oxygen: Secondary | ICD-10-CM | POA: Diagnosis not present

## 2021-10-17 DIAGNOSIS — Z825 Family history of asthma and other chronic lower respiratory diseases: Secondary | ICD-10-CM | POA: Diagnosis not present

## 2021-10-17 DIAGNOSIS — Z9114 Patient's other noncompliance with medication regimen: Secondary | ICD-10-CM

## 2021-10-17 DIAGNOSIS — I429 Cardiomyopathy, unspecified: Secondary | ICD-10-CM | POA: Diagnosis not present

## 2021-10-17 DIAGNOSIS — D631 Anemia in chronic kidney disease: Secondary | ICD-10-CM | POA: Diagnosis present

## 2021-10-17 DIAGNOSIS — N400 Enlarged prostate without lower urinary tract symptoms: Secondary | ICD-10-CM | POA: Diagnosis present

## 2021-10-17 DIAGNOSIS — R609 Edema, unspecified: Secondary | ICD-10-CM | POA: Diagnosis not present

## 2021-10-17 DIAGNOSIS — J45901 Unspecified asthma with (acute) exacerbation: Secondary | ICD-10-CM | POA: Diagnosis not present

## 2021-10-17 DIAGNOSIS — D500 Iron deficiency anemia secondary to blood loss (chronic): Secondary | ICD-10-CM | POA: Diagnosis present

## 2021-10-17 DIAGNOSIS — M109 Gout, unspecified: Secondary | ICD-10-CM | POA: Diagnosis present

## 2021-10-17 DIAGNOSIS — I4891 Unspecified atrial fibrillation: Secondary | ICD-10-CM | POA: Diagnosis not present

## 2021-10-17 DIAGNOSIS — N2581 Secondary hyperparathyroidism of renal origin: Secondary | ICD-10-CM | POA: Diagnosis present

## 2021-10-17 DIAGNOSIS — R109 Unspecified abdominal pain: Secondary | ICD-10-CM | POA: Diagnosis not present

## 2021-10-17 DIAGNOSIS — I5043 Acute on chronic combined systolic (congestive) and diastolic (congestive) heart failure: Secondary | ICD-10-CM | POA: Diagnosis present

## 2021-10-17 DIAGNOSIS — I5031 Acute diastolic (congestive) heart failure: Secondary | ICD-10-CM | POA: Diagnosis not present

## 2021-10-17 DIAGNOSIS — J9601 Acute respiratory failure with hypoxia: Secondary | ICD-10-CM | POA: Diagnosis not present

## 2021-10-17 DIAGNOSIS — R001 Bradycardia, unspecified: Secondary | ICD-10-CM | POA: Diagnosis not present

## 2021-10-17 DIAGNOSIS — I428 Other cardiomyopathies: Secondary | ICD-10-CM | POA: Diagnosis not present

## 2021-10-17 DIAGNOSIS — I480 Paroxysmal atrial fibrillation: Secondary | ICD-10-CM | POA: Diagnosis not present

## 2021-10-17 DIAGNOSIS — J449 Chronic obstructive pulmonary disease, unspecified: Secondary | ICD-10-CM | POA: Diagnosis present

## 2021-10-17 DIAGNOSIS — J9621 Acute and chronic respiratory failure with hypoxia: Secondary | ICD-10-CM

## 2021-10-17 DIAGNOSIS — I4819 Other persistent atrial fibrillation: Principal | ICD-10-CM

## 2021-10-17 DIAGNOSIS — F419 Anxiety disorder, unspecified: Secondary | ICD-10-CM | POA: Diagnosis not present

## 2021-10-17 DIAGNOSIS — I10 Essential (primary) hypertension: Secondary | ICD-10-CM | POA: Diagnosis not present

## 2021-10-17 DIAGNOSIS — E66813 Obesity, class 3: Secondary | ICD-10-CM

## 2021-10-17 DIAGNOSIS — A419 Sepsis, unspecified organism: Secondary | ICD-10-CM | POA: Diagnosis not present

## 2021-10-17 DIAGNOSIS — G4733 Obstructive sleep apnea (adult) (pediatric): Secondary | ICD-10-CM

## 2021-10-17 DIAGNOSIS — I340 Nonrheumatic mitral (valve) insufficiency: Secondary | ICD-10-CM | POA: Diagnosis not present

## 2021-10-17 DIAGNOSIS — I130 Hypertensive heart and chronic kidney disease with heart failure and stage 1 through stage 4 chronic kidney disease, or unspecified chronic kidney disease: Secondary | ICD-10-CM | POA: Diagnosis not present

## 2021-10-17 DIAGNOSIS — N189 Chronic kidney disease, unspecified: Secondary | ICD-10-CM | POA: Diagnosis not present

## 2021-10-17 DIAGNOSIS — I361 Nonrheumatic tricuspid (valve) insufficiency: Secondary | ICD-10-CM | POA: Diagnosis not present

## 2021-10-17 DIAGNOSIS — I959 Hypotension, unspecified: Secondary | ICD-10-CM | POA: Diagnosis not present

## 2021-10-17 DIAGNOSIS — Z9049 Acquired absence of other specified parts of digestive tract: Secondary | ICD-10-CM

## 2021-10-17 DIAGNOSIS — M810 Age-related osteoporosis without current pathological fracture: Secondary | ICD-10-CM | POA: Diagnosis present

## 2021-10-17 DIAGNOSIS — Z7951 Long term (current) use of inhaled steroids: Secondary | ICD-10-CM

## 2021-10-17 DIAGNOSIS — E669 Obesity, unspecified: Secondary | ICD-10-CM | POA: Diagnosis not present

## 2021-10-17 DIAGNOSIS — R0902 Hypoxemia: Secondary | ICD-10-CM | POA: Diagnosis not present

## 2021-10-17 DIAGNOSIS — I2510 Atherosclerotic heart disease of native coronary artery without angina pectoris: Secondary | ICD-10-CM | POA: Diagnosis present

## 2021-10-17 DIAGNOSIS — J9600 Acute respiratory failure, unspecified whether with hypoxia or hypercapnia: Secondary | ICD-10-CM | POA: Diagnosis not present

## 2021-10-17 DIAGNOSIS — Z7901 Long term (current) use of anticoagulants: Secondary | ICD-10-CM | POA: Diagnosis not present

## 2021-10-17 DIAGNOSIS — Z885 Allergy status to narcotic agent status: Secondary | ICD-10-CM

## 2021-10-17 DIAGNOSIS — J4541 Moderate persistent asthma with (acute) exacerbation: Secondary | ICD-10-CM | POA: Diagnosis not present

## 2021-10-17 DIAGNOSIS — R112 Nausea with vomiting, unspecified: Secondary | ICD-10-CM | POA: Diagnosis not present

## 2021-10-17 DIAGNOSIS — N179 Acute kidney failure, unspecified: Secondary | ICD-10-CM | POA: Diagnosis not present

## 2021-10-17 DIAGNOSIS — N184 Chronic kidney disease, stage 4 (severe): Secondary | ICD-10-CM | POA: Diagnosis present

## 2021-10-17 DIAGNOSIS — Z96643 Presence of artificial hip joint, bilateral: Secondary | ICD-10-CM | POA: Diagnosis present

## 2021-10-17 DIAGNOSIS — J432 Centrilobular emphysema: Secondary | ICD-10-CM | POA: Diagnosis not present

## 2021-10-17 DIAGNOSIS — D649 Anemia, unspecified: Secondary | ICD-10-CM | POA: Diagnosis not present

## 2021-10-17 DIAGNOSIS — R0602 Shortness of breath: Secondary | ICD-10-CM | POA: Diagnosis not present

## 2021-10-17 DIAGNOSIS — R0689 Other abnormalities of breathing: Secondary | ICD-10-CM | POA: Diagnosis not present

## 2021-10-17 DIAGNOSIS — J441 Chronic obstructive pulmonary disease with (acute) exacerbation: Secondary | ICD-10-CM | POA: Diagnosis not present

## 2021-10-17 LAB — CBC WITH DIFFERENTIAL/PLATELET
Abs Immature Granulocytes: 0.03 10*3/uL (ref 0.00–0.07)
Basophils Absolute: 0 10*3/uL (ref 0.0–0.1)
Basophils Relative: 1 %
Eosinophils Absolute: 0.2 10*3/uL (ref 0.0–0.5)
Eosinophils Relative: 3 %
HCT: 36.6 % — ABNORMAL LOW (ref 39.0–52.0)
Hemoglobin: 10.1 g/dL — ABNORMAL LOW (ref 13.0–17.0)
Immature Granulocytes: 0 %
Lymphocytes Relative: 15 %
Lymphs Abs: 1.2 10*3/uL (ref 0.7–4.0)
MCH: 26.8 pg (ref 26.0–34.0)
MCHC: 27.6 g/dL — ABNORMAL LOW (ref 30.0–36.0)
MCV: 97.1 fL (ref 80.0–100.0)
Monocytes Absolute: 0.6 10*3/uL (ref 0.1–1.0)
Monocytes Relative: 8 %
Neutro Abs: 5.8 10*3/uL (ref 1.7–7.7)
Neutrophils Relative %: 73 %
Platelets: 333 10*3/uL (ref 150–400)
RBC: 3.77 MIL/uL — ABNORMAL LOW (ref 4.22–5.81)
RDW: 14.6 % (ref 11.5–15.5)
WBC: 8 10*3/uL (ref 4.0–10.5)
nRBC: 0 % (ref 0.0–0.2)

## 2021-10-17 LAB — COMPREHENSIVE METABOLIC PANEL
ALT: 8 U/L (ref 0–44)
AST: 12 U/L — ABNORMAL LOW (ref 15–41)
Albumin: 2.4 g/dL — ABNORMAL LOW (ref 3.5–5.0)
Alkaline Phosphatase: 58 U/L (ref 38–126)
Anion gap: 10 (ref 5–15)
BUN: 30 mg/dL — ABNORMAL HIGH (ref 8–23)
CO2: 23 mmol/L (ref 22–32)
Calcium: 9.7 mg/dL (ref 8.9–10.3)
Chloride: 106 mmol/L (ref 98–111)
Creatinine, Ser: 2.45 mg/dL — ABNORMAL HIGH (ref 0.61–1.24)
GFR, Estimated: 27 mL/min — ABNORMAL LOW (ref >60)
Glucose, Bld: 120 mg/dL — ABNORMAL HIGH (ref 70–99)
Potassium: 4.4 mmol/L (ref 3.5–5.1)
Sodium: 139 mmol/L (ref 135–145)
Total Bilirubin: 0.6 mg/dL (ref 0.3–1.2)
Total Protein: 7.3 g/dL (ref 6.5–8.1)

## 2021-10-17 LAB — TROPONIN I (HIGH SENSITIVITY)
Troponin I (High Sensitivity): 15 ng/L (ref <18)
Troponin I (High Sensitivity): 13 ng/L (ref <18)

## 2021-10-17 LAB — PROCALCITONIN: Procalcitonin: 5.36 ng/mL

## 2021-10-17 LAB — RESP PANEL BY RT-PCR (FLU A&B, COVID) ARPGX2
Influenza A by PCR: NEGATIVE
Influenza B by PCR: NEGATIVE
SARS Coronavirus 2 by RT PCR: NEGATIVE

## 2021-10-17 LAB — LACTIC ACID, PLASMA
Lactic Acid, Venous: 1.4 mmol/L (ref 0.5–1.9)
Lactic Acid, Venous: 0.9 mmol/L (ref 0.5–1.9)

## 2021-10-17 LAB — BRAIN NATRIURETIC PEPTIDE: B Natriuretic Peptide: 691.7 pg/mL — ABNORMAL HIGH (ref 0.0–100.0)

## 2021-10-17 MED ORDER — AMIODARONE HCL IN DEXTROSE 360-4.14 MG/200ML-% IV SOLN
30.0000 mg/h | INTRAVENOUS | Status: DC
Start: 2021-10-18 — End: 2021-10-18
  Administered 2021-10-18: 30 mg/h via INTRAVENOUS
  Filled 2021-10-17: qty 200

## 2021-10-17 MED ORDER — AMIODARONE HCL IN DEXTROSE 360-4.14 MG/200ML-% IV SOLN
60.0000 mg/h | INTRAVENOUS | Status: DC
  Administered 2021-10-17 (×2): 60 mg/h via INTRAVENOUS
  Filled 2021-10-17 (×2): qty 200

## 2021-10-17 MED ORDER — FUROSEMIDE 10 MG/ML IJ SOLN
20.0000 mg | Freq: Two times a day (BID) | INTRAMUSCULAR | Status: DC
  Administered 2021-10-17 – 2021-10-18 (×2): 20 mg via INTRAVENOUS
  Filled 2021-10-17 (×2): qty 4

## 2021-10-17 MED ORDER — SODIUM CHLORIDE 0.9 % IV SOLN
100.0000 mg | Freq: Once | INTRAVENOUS | Status: AC
  Administered 2021-10-17: 100 mg via INTRAVENOUS
  Filled 2021-10-17: qty 100

## 2021-10-17 MED ORDER — DILTIAZEM HCL 25 MG/5ML IV SOLN
10.0000 mg | Freq: Once | INTRAVENOUS | Status: AC
  Administered 2021-10-17: 10 mg via INTRAVENOUS
  Filled 2021-10-17: qty 5

## 2021-10-17 MED ORDER — ALBUTEROL SULFATE (2.5 MG/3ML) 0.083% IN NEBU
2.5000 mg | INHALATION_SOLUTION | RESPIRATORY_TRACT | Status: DC | PRN
  Administered 2021-10-17: 2.5 mg via RESPIRATORY_TRACT
  Filled 2021-10-17: qty 3

## 2021-10-17 MED ORDER — APIXABAN 5 MG PO TABS
5.0000 mg | ORAL_TABLET | Freq: Two times a day (BID) | ORAL | Status: DC
Start: 2021-10-17 — End: 2021-10-29
  Administered 2021-10-17 – 2021-10-29 (×24): 5 mg via ORAL
  Filled 2021-10-17 (×24): qty 1

## 2021-10-17 MED ORDER — SODIUM CHLORIDE 0.9 % IV SOLN
2.0000 g | Freq: Once | INTRAVENOUS | Status: AC
  Administered 2021-10-17: 2 g via INTRAVENOUS
  Filled 2021-10-17: qty 20

## 2021-10-17 MED ORDER — DOXYCYCLINE HYCLATE 100 MG PO TABS: 100.0000 mg | ORAL_TABLET | Freq: Two times a day (BID) | ORAL | Status: DC

## 2021-10-17 MED ORDER — AMIODARONE LOAD VIA INFUSION
150.0000 mg | Freq: Once | INTRAVENOUS | Status: AC
  Administered 2021-10-17: 150 mg via INTRAVENOUS
  Filled 2021-10-17: qty 83.34

## 2021-10-17 MED ORDER — ONDANSETRON HCL 4 MG/2ML IJ SOLN
4.0000 mg | Freq: Four times a day (QID) | INTRAMUSCULAR | Status: DC | PRN
  Administered 2021-10-19 – 2021-10-28 (×6): 4 mg via INTRAVENOUS
  Filled 2021-10-17 (×7): qty 2

## 2021-10-17 MED ORDER — IPRATROPIUM BROMIDE 0.02 % IN SOLN
0.5000 mg | Freq: Four times a day (QID) | RESPIRATORY_TRACT | Status: DC
  Administered 2021-10-18 – 2021-10-19 (×6): 0.5 mg via RESPIRATORY_TRACT
  Filled 2021-10-17 (×6): qty 2.5

## 2021-10-17 MED ORDER — AMIODARONE IV BOLUS ONLY 150 MG/100ML
150.0000 mg | Freq: Once | INTRAVENOUS | Status: AC
  Administered 2021-10-17: 150 mg via INTRAVENOUS
  Filled 2021-10-17: qty 100

## 2021-10-17 MED ORDER — ACETAMINOPHEN 325 MG PO TABS
650.0000 mg | ORAL_TABLET | ORAL | Status: DC | PRN
  Administered 2021-10-18 (×2): 650 mg via ORAL
  Filled 2021-10-17 (×2): qty 2

## 2021-10-17 NOTE — ED Notes (Signed)
Pt on zoll now. ?

## 2021-10-17 NOTE — ED Notes (Signed)
Getting EKG ?

## 2021-10-17 NOTE — Assessment & Plan Note (Signed)
Renal function at baseline 

## 2021-10-17 NOTE — ED Notes (Signed)
Port CXR performed 

## 2021-10-17 NOTE — ED Notes (Signed)
Pt provided sandwich and soda per MD ok ?

## 2021-10-17 NOTE — Assessment & Plan Note (Signed)
Patient was previously on O2 ?Secondary to rapid A-fib with possible CHF, with possible respiratory tract infection ?Possible bronchitis, no wheezing heard on exam ?Procalcitonin elevated at 5 without evidence of pneumonia on x-ray. ?Continue BiPAP and wean as tolerated ?

## 2021-10-17 NOTE — Assessment & Plan Note (Signed)
IV Lasix ?Resume metoprolol when weaned off amiodarone ?

## 2021-10-17 NOTE — Assessment & Plan Note (Addendum)
Continue amiodarone infusion for now ?Cardiology consult for further recommendations ?Continue Eliquis ?

## 2021-10-17 NOTE — ED Provider Notes (Signed)
[facility] Provider Note    Event Date/Time   First MD Initiated Contact with Patient 10/17/21 1853     (approximate)   History   Respiratory Distress (On cpap ems)   HPI  Matthew [name] is a 72 y.o. male with past medical history of hypertension, A-fib, asthma, COPD, here with shortness of breath.  History is somewhat limited due to significant dyspnea on arrival and use of BiPAP.  However, patient states that he has felt somewhat weak and short of breath for the last several days.  Denies any leg swelling.  States he has been coughing mildly but denies fevers or sputum production.  He got acutely more short of breath today.  He has felt significant palpitations.  He subsequently presents for evaluation.  On EMS arrival, patient was hypoxic, diaphoretic, in obvious respiratory distress.  He has had some initial improvement after receiving BiPAP.  He was given 1 breathing treatment as well.  He states he feels about the same as he did prior to this.  Denies any other complaints.     Physical Exam   Triage Vital Signs: ED Triage Vitals [10/17/21 1851]  Enc Vitals Group     BP      Pulse      Resp      Temp      Temp src      SpO2      Weight 284 lb 6.3 oz (129 kg)     Height '5\' 6"'$  (1.676 m)     Head Circumference      Peak Flow      Pain Score      Pain Loc      Pain Edu?      Excl. in [place]?     Most recent vital signs: Vitals:   10/18/21 0000 10/18/21 0015  BP: 122/66 128/80  Pulse: 99 (!) 138  Resp: (!) 27 (!) 26  Temp:    SpO2: 100% 100%     General: Awake, sitting upright in mild respiratory distress. CV:  Good peripheral perfusion.  Markedly tachycardic, irregular. Resp:  Increased work of breathing, retractions, speaking in short sentences.  BiPAP in place.  Bilateral rales. Abd:  No distention.  No tenderness. Other:  Trace bilateral lower extremity edema.  No asymmetry.  No tenderness.   ED Results / Procedures / Treatments    Labs (all labs ordered are listed, but only abnormal results are displayed) Labs Reviewed  CBC WITH DIFFERENTIAL/PLATELET - Abnormal; Notable for the following components:      Result Value   RBC 3.77 (*)    Hemoglobin 10.1 (*)    HCT 36.6 (*)    MCHC 27.6 (*)    All other components within normal limits  COMPREHENSIVE METABOLIC PANEL - Abnormal; Notable for the following components:   Glucose, Bld 120 (*)    BUN 30 (*)    Creatinine, Ser 2.45 (*)    Albumin 2.4 (*)    AST 12 (*)    GFR, Estimated 27 (*)    All other components within normal limits  BRAIN NATRIURETIC PEPTIDE - Abnormal; Notable for the following components:   B Natriuretic Peptide 691.7 (*)    All other components within normal limits  RESP PANEL BY RT-PCR (FLU A&B, COVID) ARPGX2  CULTURE, BLOOD (ROUTINE X 2)  CULTURE, BLOOD (ROUTINE X 2)  LACTIC ACID, PLASMA  LACTIC ACID, PLASMA  PROCALCITONIN  BASIC METABOLIC PANEL  HIV ANTIBODY (  ROUTINE TESTING W REFLEX)  TROPONIN I (HIGH SENSITIVITY)  TROPONIN I (HIGH SENSITIVITY)     EKG A-fib RVR, ventricular rate 165.  QRS narrow at 97.  QTc 454.  Nonspecific ST changes likely rate related.  No ST elevations.   RADIOLOGY Chest x-ray:Clear   I also independently reviewed and agree wit radiologist interpretations.   PROCEDURES:  Critical Care performed: Yes, see critical care procedure note(s)  .Critical Care Performed by: Duffy Bruce, MD Authorized by: Duffy Bruce, MD   Critical care provider statement:    Critical care time (minutes):  30   Critical care time was exclusive of:  Separately billable procedures and treating other patients   Critical care was necessary to treat or prevent imminent or life-threatening deterioration of the following conditions:  Cardiac failure, circulatory failure and respiratory failure   Critical care was time spent personally by me on the following activities:  Development of treatment plan with patient or  surrogate, discussions with consultants, evaluation of patient's response to treatment, examination of patient, ordering and review of laboratory studies, ordering and review of radiographic studies, ordering and performing treatments and interventions, pulse oximetry, re-evaluation of patient's condition and review of old [facility] ED: Medications  amiodarone (NEXTERONE) 1.8 mg/mL load via infusion 150 mg (150 mg Intravenous Bolus from Bag 10/17/21 1928)    Followed by  amiodarone (NEXTERONE PREMIX) 360-4.14 MG/200ML-% (1.8 mg/mL) IV infusion (60 mg/hr Intravenous New Bag/Given 10/17/21 2330)    Followed by  amiodarone (NEXTERONE PREMIX) 360-4.14 MG/200ML-% (1.8 mg/mL) IV infusion (has no administration in time range)  apixaban (ELIQUIS) tablet 5 mg (5 mg Oral Given 10/17/21 2341)  acetaminophen (TYLENOL) tablet 650 mg (has no administration in time range)  ondansetron (ZOFRAN) injection 4 mg (has no administration in time range)  furosemide (LASIX) injection 20 mg (20 mg Intravenous Given 10/17/21 2356)  doxycycline (VIBRA-TABS) tablet 100 mg (has no administration in time range)  ipratropium (ATROVENT) nebulizer solution 0.5 mg (has no administration in time range)  albuterol (PROVENTIL) (2.5 MG/3ML) 0.083% nebulizer solution 2.5 mg (2.5 mg Nebulization Given 10/17/21 2340)  amiodarone (NEXTERONE) IV bolus only 150 mg/100 mL (0 mg Intravenous Stopped 10/17/21 1922)  diltiazem (CARDIZEM) injection 10 mg (10 mg Intravenous Given 10/17/21 2106)  cefTRIAXone (ROCEPHIN) 2 g in sodium chloride 0.9 % 100 mL IVPB (0 g Intravenous Stopped 10/17/21 2155)  doxycycline (VIBRAMYCIN) 100 mg in sodium chloride 0.9 % 250 mL IVPB (0 mg Intravenous Paused 10/17/21 2331)     IMPRESSION / MDM / [place] / ED COURSE  I reviewed the triage vital signs and the nursing notes.                               The patient is on the cardiac monitor to evaluate for evidence of arrhythmia and/or  significant heart rate changes.  MDM:  71 yo M with extensive PMHx including COPD, AFib, CHF, obesity, OSA, here with SOB. On arrival, pt hypoxic, tachycardic in apparent Afib RVR. BIPAP continued, and pt given IV amiodarone bolus x 2 given borderline hypotension, persistent AFib. He has been compliant with his anticoagulation. Pt also reporting preceding cough, unclear whether this is 2/2 his AFib and CHF versus COPD or bronchitis/CAP. [facility] labs sent, reviewed. CBC reassuring with no leukocytosis. CMP with baseline CKD. Trop, BNP elevated likely in setting of mild CHF and AFib RVR. CXR reviewed and is  clear. Labs would suggest primary CHF/AFib RVR, but Procal positive and pt does endorse some sputum production, with h/o COPD and PNA. Will cover empirically for possible CAP, admit to medicine for further management.    MEDICATIONS GIVEN IN ED: Medications  amiodarone (NEXTERONE) 1.8 mg/mL load via infusion 150 mg (150 mg Intravenous Bolus from Bag 10/17/21 1928)    Followed by  amiodarone (NEXTERONE PREMIX) 360-4.14 MG/200ML-% (1.8 mg/mL) IV infusion (60 mg/hr Intravenous New Bag/Given 10/17/21 2330)    Followed by  amiodarone (NEXTERONE PREMIX) 360-4.14 MG/200ML-% (1.8 mg/mL) IV infusion (has no administration in time range)  apixaban (ELIQUIS) tablet 5 mg (5 mg Oral Given 10/17/21 2341)  acetaminophen (TYLENOL) tablet 650 mg (has no administration in time range)  ondansetron (ZOFRAN) injection 4 mg (has no administration in time range)  furosemide (LASIX) injection 20 mg (20 mg Intravenous Given 10/17/21 2356)  doxycycline (VIBRA-TABS) tablet 100 mg (has no administration in time range)  ipratropium (ATROVENT) nebulizer solution 0.5 mg (has no administration in time range)  albuterol (PROVENTIL) (2.5 MG/3ML) 0.083% nebulizer solution 2.5 mg (2.5 mg Nebulization Given 10/17/21 2340)  amiodarone (NEXTERONE) IV bolus only 150 mg/100 mL (0 mg Intravenous Stopped 10/17/21 1922)  diltiazem (CARDIZEM) injection  10 mg (10 mg Intravenous Given 10/17/21 2106)  cefTRIAXone (ROCEPHIN) 2 g in sodium chloride 0.9 % 100 mL IVPB (0 g Intravenous Stopped 10/17/21 2155)  doxycycline (VIBRAMYCIN) 100 mg in sodium chloride 0.9 % 250 mL IVPB (0 mg Intravenous Paused 10/17/21 2331)     Consults:  Dr. Damita Dunnings Hospitalist consulted for admission   EMR reviewed  PCP notes, most recently with Dr. Lavera Guise on 09/2021     FINAL CLINICAL IMPRESSION(S) / ED DIAGNOSES   Final diagnoses:  Atrial fibrillation with rapid ventricular response ([place])  Acute respiratory failure with hypoxemia [facility])     Rx / DC Orders   ED Discharge Orders          Ordered    Amb referral to AFIB Clinic        10/17/21 2327             Note:  This document was prepared using Dragon voice recognition software and may include unintentional dictation errors.   Duffy Bruce, MD 10/18/21 [phone]

## 2021-10-17 NOTE — H&P (Signed)
History and Physical    Patient: Matthew [name] JJK:[id number] DOB: February 23, 1951 DOA: 10/17/2021 DOS: the patient was seen and examined on 10/17/2021 PCP: [name], MD  Patient coming from: Home  Chief Complaint:  Chief Complaint  Patient presents with   Respiratory Distress    On cpap ems    HPI: Matthew [name] is a 71 y.o. male with medical history significant for COPD, OSA not on CPAP, prior home O2 use, class IV obesity, CKD stage IV, HTN, A-fib on Eliquis, diastolic CHF with last exacerbation in October 2022, who presents to the ED by EMS with respiratory distress, arriving on CPAP.  Patient states he was in his usual state of health until the day prior when he started having shortness of breath which significantly worsened over the next 24 hours.  He has associated nonradiating retrosternal tightness, worse with breathing.  Denies lower extremity edema.  Has some orthopnea.  Has a nonproductive cough but no fever or chills.  No affected contacts ED course: On arrival tachycardic to 138, tachypneic to 29 with BP 99/74 and O2 sat of 100% on CPAP with increased work of breathing.  Patient transitioned to BiPAP on arrival Blood work: Troponin 15-13, BNP 691.  Creatinine 2.45 which is at baseline.  WBC 8000 with lactic acid 1.4 and Pro-Calc 5.36.  Hemoglobin 10.1. EKG, personally viewed and interpreted: A-fib at 165 with no acute ST-T wave changes Chest x-ray no active disease Patient given an amiodarone bolus and started on an infusion.  Diltiazem not given due to soft blood pressure.  Started on doxycycline for possible bronchitis.  Hospitalist consulted for admission.   Review of Systems: As mentioned in the history of present illness. All other systems reviewed and are negative. Past Medical History:  Diagnosis Date   Anemia    Anxiety    Arrhythmia    atrial fibrillation   Asthma    CAD (coronary artery disease)    CHF (congestive heart failure) (HCC)    Chronic kidney disease     COPD (chronic obstructive pulmonary disease) (HCC)    Depression    Edema    Gout    Hip dislocation, bilateral ([place])    Hypertension    Osteoporosis    Sleep apnea    Past Surgical History:  Procedure Laterality Date   ABDOMINAL SURGERY     CHOLECYSTECTOMY     COLOSTOMY     COLOSTOMY TAKEDOWN     HERNIA REPAIR     LEFT HEART CATH AND CORONARY ANGIOGRAPHY N/A 10/17/2018   Procedure: LEFT HEART CATH AND CORONARY ANGIOGRAPHY with possible PCI and stent;  Surgeon: Yolonda Kida, MD;  Location: [facility];  Service: Cardiovascular;  Laterality: N/A;   ORIF FEMUR FRACTURE Left 05/19/2020   Procedure: OPEN REDUCTION INTERNAL FIXATION (ORIF) DISTAL FEMUR FRACTURE;  Surgeon: Shona Needles, MD;  Location: [facility];  Service: Orthopedics;  Laterality: Left;   TOTAL HIP ARTHROPLASTY Bilateral    Social History:  reports that he has never smoked. He has never used smokeless tobacco. He reports that he does not currently use alcohol. No history on file for drug use.  Allergies  Allergen Reactions   Demerol [Meperidine Hcl]    Lisinopril Other (See Comments)    Hypotensive    Meperidine And Related     Family History  Problem Relation Age of Onset   COPD Mother    Cancer Mother    Melanoma Father  Prior to Admission medications   Medication Sig Start Date End Date Taking? Authorizing Provider  albuterol (VENTOLIN HFA) 108 (90 Base) MCG/ACT inhaler INHALE 1 PUFF BY MOUTH INTO LUNGS DAILY 09/28/21  Yes Masoud, Viann Shove, MD  bisacodyl (DULCOLAX) 5 MG EC tablet Take 1 tablet (5 mg total) by mouth daily as needed for moderate constipation. 09/14/21  Yes Masoud, Viann Shove, MD  DULoxetine (CYMBALTA) 60 MG capsule Take 1 capsule (60 mg total) by mouth 2 (two) times daily. 09/14/21  Yes Masoud, Viann Shove, MD  ELIQUIS 5 MG TABS tablet TAKE 1 TABLET(5 MG) BY MOUTH TWICE DAILY 09/22/21  Yes Masoud, Viann Shove, MD  furosemide (LASIX) 20 MG tablet TAKE 1 TABLET(20 MG) BY MOUTH DAILY 09/20/21  Yes Masoud,  Viann Shove, MD  ipratropium (ATROVENT) 0.02 % nebulizer solution SMARTSIG:3 Milliliter(s) Via Nebulizer Every 6 Hours PRN 08/05/21  Yes [provider]  ipratropium-albuterol (DUONEB) 0.5-2.5 (3) MG/3ML SOLN USE 1 VIAL VIA NEBULIZER EVERY 6 HOURS AS NEEDED 10/13/21  Yes Masoud, Viann Shove, MD  melatonin 5 MG TABS Take 1 tablet (5 mg total) by mouth at bedtime. 06/23/21  Yes Richarda Osmond, MD  metoprolol tartrate (LOPRESSOR) 50 MG tablet Take 1 tablet (50 mg total) by mouth 2 (two) times daily. 09/14/21 10/17/21 Yes Masoud, Viann Shove, MD  MIRALAX 17 GM/SCOOP powder SMARTSIG:17 Gram(s) By Mouth Daily PRN 08/05/21  Yes [provider]  mirtazapine (REMERON) 15 MG tablet Take 1 tablet (15 mg total) by mouth at bedtime. 09/14/21  Yes Masoud, Viann Shove, MD  acetaminophen (TYLENOL) 325 MG tablet Take 2 tablets (650 mg total) by mouth every 4 (four) hours as needed for headache or mild pain. Patient not taking: Reported on 10/17/2021 07/18/21   Nicole Kindred A, DO  allopurinol (ZYLOPRIM) 100 MG tablet Take 1 tablet (100 mg total) by mouth daily. Patient not taking: Reported on 10/17/2021 09/14/21   [name], MD  colchicine 0.6 MG tablet Take 0.5 tablets (0.3 mg total) by mouth daily. Patient not taking: Reported on 10/17/2021 06/24/21   Richarda Osmond, MD  diltiazem (CARDIZEM CD) 300 MG 24 hr capsule Take 1 capsule (300 mg total) by mouth daily. Patient not taking: Reported on 10/17/2021 09/14/21 12/13/21  [name], MD  hydrOXYzine (ATARAX) 25 MG tablet Take 25 mg by mouth every 6 (six) hours as needed. Patient not taking: Reported on 10/17/2021    [provider]  mometasone-formoterol (DULERA) 200-5 MCG/ACT AERO Inhale 2 puffs into the lungs 2 (two) times daily. Patient not taking: Reported on 10/17/2021 09/14/21   [name], MD  Multiple Vitamin (MULTIVITAMIN WITH MINERALS) TABS tablet Take 1 tablet by mouth daily. Patient not taking: Reported on 10/17/2021 07/07/21   Enzo Bi, MD  polyethylene  glycol (MIRALAX / GLYCOLAX) 17 g packet Take 17 g by mouth daily as needed for mild constipation. Patient not taking: Reported on 10/17/2021 06/23/21   Richarda Osmond, MD  potassium chloride SA (KLOR-CON M) 20 MEQ tablet Take 20 mEq by mouth daily. Patient not taking: Reported on 10/17/2021    [provider]  tamsulosin (FLOMAX) 0.4 MG CAPS capsule Take 1 capsule (0.4 mg total) by mouth daily after supper. Patient not taking: Reported on 10/17/2021 09/14/21 12/13/21  [name], MD  vitamin B-12 1000 MCG tablet Take 1 tablet (1,000 mcg total) by mouth daily. Patient not taking: Reported on 10/17/2021 07/19/21   Ezekiel Slocumb, DO    Physical Exam: Vitals:   10/17/21 2215 10/17/21 2230 10/17/21 2245 10/17/21 2300  BP: 91/70 103/75 109/88 112/81  Pulse: (!) 112 (!) 123 (!) 128 (!) 116  Resp: 20 (!) 24 20 (!) 26  Temp:      TempSrc:      SpO2: 98% 100% 95% 98%  Weight:      Height:       Physical Exam Vitals and nursing note reviewed.  Constitutional:      General: He is not in acute distress.    Appearance: He is obese.     Comments: On bipap, increased work of breathing  HENT:     Head: Normocephalic and atraumatic.  Cardiovascular:     Rate and Rhythm: Regular rhythm. Tachycardia present.     Pulses: Normal pulses.     Heart sounds: Normal heart sounds. No murmur heard. Pulmonary:     Effort: Tachypnea present.     Breath sounds: Decreased air movement present. No wheezing or rhonchi.  Abdominal:     General: Bowel sounds are normal.     Palpations: Abdomen is soft.     Tenderness: There is no abdominal tenderness.  Musculoskeletal:        General: No swelling or tenderness. Normal range of motion.     Cervical back: Normal range of motion and neck supple.  Skin:    General: Skin is warm and dry.  Neurological:     General: No focal deficit present.     Mental Status: He is alert. Mental status is at baseline.  Psychiatric:        Mood and Affect: Mood  normal.        Behavior: Behavior normal.     Data Reviewed: Relevant notes from primary care and specialist visits, past discharge summaries as available in EHR, including Care Everywhere. Prior diagnostic testing as pertinent to current admission diagnoses Updated medications and problem lists for reconciliation ED course, including vitals, labs, imaging, treatment and response to treatment Triage notes, nursing and pharmacy notes and ED provider's notes Notable results as noted in HPI   Assessment and Plan: * Rapid atrial fibrillation (HCC) Continue amiodarone infusion for now Cardiology consult for further recommendations Continue Eliquis  Acute on chronic respiratory failure with hypoxia ([place]) Patient was previously on O2 Secondary to rapid A-fib with possible CHF, with possible respiratory tract infection Possible bronchitis, no wheezing heard on exam Procalcitonin elevated at 5 without evidence of pneumonia on x-ray. Continue BiPAP and wean as tolerated  Acute on chronic diastolic CHF (congestive heart failure) (HCC) IV Lasix Resume metoprolol when weaned off amiodarone  OSA (obstructive sleep apnea) Not on CPAP  Obesity, Class III, BMI 40-49.9 (morbid obesity) (HCC) Complicating factor to overall prognosis and care  CKD (chronic kidney disease) stage 4, GFR 15-29 ml/min (HCC) Renal function at baseline  COPD (chronic obstructive pulmonary disease) (HCC) Possible mild exacerbation DuoNebs as needed Given high procalcitonin on arrival we will continue doxycycline initiated in the ED  Hypertension Holding antihypertensives due to soft blood pressure on arrival       Advance Care Planning:   Code Status: Prior   Consults: cardiology, Dr Corky Sox  Family Communication: none  Severity of Illness: The appropriate patient status for this patient is INPATIENT. Inpatient status is judged to be reasonable and necessary in order to provide the required intensity  of service to ensure the patient's safety. The patient's presenting symptoms, physical exam findings, and initial radiographic and laboratory data in the context of their chronic comorbidities is felt to place them at high risk  for further clinical deterioration. Furthermore, it is not anticipated that the patient will be medically stable for discharge from the hospital within 2 midnights of admission.   * I certify that at the point of admission it is my clinical judgment that the patient will require inpatient hospital care spanning beyond 2 midnights from the point of admission due to high intensity of service, high risk for further deterioration and high frequency of surveillance required.*  Author: Athena Masse, MD 10/17/2021 11:09 PM  For on call review [email or web address].

## 2021-10-17 NOTE — Assessment & Plan Note (Signed)
Holding antihypertensives due to soft blood pressure on arrival ?

## 2021-10-17 NOTE — Assessment & Plan Note (Signed)
Complicating factor to overall prognosis and care 

## 2021-10-17 NOTE — ED Notes (Signed)
Placed on zoll per provider. Still getting vs.  ?

## 2021-10-17 NOTE — ED Notes (Signed)
RT at the bedside. Bipap removed and pt placed on 5L./[place]. O2 sats maintaining at 100% ?

## 2021-10-17 NOTE — ED Triage Notes (Signed)
Pt to ED [facility] on cpap ?COPD asthma recent dx CHF ?From home got up from couch walked to bathroom felt SOB ?Gave self breathing tx no relief, does not wear O2 at home ? ?RR 30, less labored on cpap ?ETCO2 26 ?Was 82% on 3L ?EDP and RT at bedside ? ?a fib 150-180, hx of same last BP 110 palpated, CBG 53 ?Received 1 albuterol nebulized ? ?Hx dementia per wife ? ?HR currently 180 on monitor ?Placed on bipap ?

## 2021-10-17 NOTE — ED Notes (Signed)
Report received from Reina, RN 

## 2021-10-17 NOTE — Assessment & Plan Note (Addendum)
Possible mild exacerbation ?DuoNebs as needed ?Given high procalcitonin on arrival we will continue doxycycline initiated in the ED ?

## 2021-10-18 ENCOUNTER — Inpatient Hospital Stay (HOSPITAL_COMMUNITY)
Admit: 2021-10-18 | Discharge: 2021-10-18 | Disposition: A | Discharge status: Home / Self Care | Payer: Medicare HMO | Attending: Internal Medicine | Admitting: Internal Medicine

## 2021-10-18 DIAGNOSIS — N184 Chronic kidney disease, stage 4 (severe): Secondary | ICD-10-CM | POA: Diagnosis not present

## 2021-10-18 DIAGNOSIS — I4891 Unspecified atrial fibrillation: Secondary | ICD-10-CM | POA: Diagnosis not present

## 2021-10-18 DIAGNOSIS — J9600 Acute respiratory failure, unspecified whether with hypoxia or hypercapnia: Secondary | ICD-10-CM

## 2021-10-18 DIAGNOSIS — I5031 Acute diastolic (congestive) heart failure: Secondary | ICD-10-CM | POA: Diagnosis not present

## 2021-10-18 DIAGNOSIS — G4733 Obstructive sleep apnea (adult) (pediatric): Secondary | ICD-10-CM

## 2021-10-18 DIAGNOSIS — I5023 Acute on chronic systolic (congestive) heart failure: Secondary | ICD-10-CM | POA: Diagnosis not present

## 2021-10-18 LAB — BLOOD CULTURE ID PANEL (REFLEXED) - BCID2

## 2021-10-18 LAB — ECHOCARDIOGRAM COMPLETE
AR max vel: 3.47 cm2
AV Area VTI: 3.56 cm2
AV Area mean vel: 3.34 cm2
AV Mean grad: 4 mmHg
AV Peak grad: 7.7 mmHg
Ao pk vel: 1.39 m/s
Area-P 1/2: 5.43 cm2
Height: 66 in
MV VTI: 4.78 cm2
S' Lateral: 4.57 cm
Weight: 4550.29 oz

## 2021-10-18 LAB — BASIC METABOLIC PANEL
Anion gap: 7 (ref 5–15)
BUN: 31 mg/dL — ABNORMAL HIGH (ref 8–23)
CO2: 24 mmol/L (ref 22–32)
Calcium: 8.9 mg/dL (ref 8.9–10.3)
Chloride: 107 mmol/L (ref 98–111)
Creatinine, Ser: 2.48 mg/dL — ABNORMAL HIGH (ref 0.61–1.24)
GFR, Estimated: 27 mL/min — ABNORMAL LOW (ref >60)
Glucose, Bld: 108 mg/dL — ABNORMAL HIGH (ref 70–99)
Potassium: 4.2 mmol/L (ref 3.5–5.1)
Sodium: 138 mmol/L (ref 135–145)

## 2021-10-18 MED ORDER — MOMETASONE FURO-FORMOTEROL FUM 200-5 MCG/ACT IN AERO
2.0000 | INHALATION_SPRAY | Freq: Two times a day (BID) | RESPIRATORY_TRACT | Status: DC
  Administered 2021-10-19 – 2021-10-29 (×21): 2 via RESPIRATORY_TRACT
  Filled 2021-10-18 (×3): qty 8.8

## 2021-10-18 MED ORDER — DILTIAZEM HCL-DEXTROSE 125-5 MG/125ML-% IV SOLN (PREMIX)
5.0000 mg/h | INTRAVENOUS | Status: DC
  Administered 2021-10-18 – 2021-10-19 (×2): 5 mg/h via INTRAVENOUS
  Filled 2021-10-18 (×2): qty 125

## 2021-10-18 MED ORDER — PERFLUTREN LIPID MICROSPHERE
1.0000 mL | INTRAVENOUS | Status: AC | PRN
  Administered 2021-10-18: 3 mL via INTRAVENOUS
  Filled 2021-10-18: qty 10

## 2021-10-18 MED ORDER — MELATONIN 5 MG PO TABS
5.0000 mg | ORAL_TABLET | Freq: Every day | ORAL | Status: DC
  Administered 2021-10-18 – 2021-10-28 (×11): 5 mg via ORAL
  Filled 2021-10-18 (×11): qty 1

## 2021-10-18 MED ORDER — LORAZEPAM 0.5 MG PO TABS
0.2500 mg | ORAL_TABLET | Freq: Once | ORAL | Status: AC
  Administered 2021-10-18: 0.25 mg via ORAL
  Filled 2021-10-18: qty 1

## 2021-10-18 MED ORDER — FUROSEMIDE 10 MG/ML IJ SOLN
40.0000 mg | Freq: Two times a day (BID) | INTRAMUSCULAR | Status: DC
  Administered 2021-10-18 – 2021-10-19 (×3): 40 mg via INTRAVENOUS
  Filled 2021-10-18 (×3): qty 4

## 2021-10-18 MED ORDER — MIRTAZAPINE 15 MG PO TABS
15.0000 mg | ORAL_TABLET | Freq: Every day | ORAL | Status: DC
  Administered 2021-10-18 – 2021-10-28 (×11): 15 mg via ORAL
  Filled 2021-10-18 (×11): qty 1

## 2021-10-18 MED ORDER — DULOXETINE HCL 30 MG PO CPEP
60.0000 mg | ORAL_CAPSULE | Freq: Two times a day (BID) | ORAL | Status: DC
  Administered 2021-10-18 – 2021-10-29 (×23): 60 mg via ORAL
  Filled 2021-10-18 (×17): qty 2
  Filled 2021-10-18: qty 1
  Filled 2021-10-18 (×5): qty 2

## 2021-10-18 MED ORDER — METOPROLOL TARTRATE 25 MG PO TABS
12.5000 mg | ORAL_TABLET | Freq: Four times a day (QID) | ORAL | Status: DC
  Administered 2021-10-18 – 2021-10-20 (×7): 12.5 mg via ORAL
  Filled 2021-10-18 (×7): qty 1

## 2021-10-18 MED ORDER — TAMSULOSIN HCL 0.4 MG PO CAPS
0.4000 mg | ORAL_CAPSULE | Freq: Every day | ORAL | Status: DC
  Administered 2021-10-18 – 2021-10-28 (×11): 0.4 mg via ORAL
  Filled 2021-10-18 (×11): qty 1

## 2021-10-18 MED ORDER — IPRATROPIUM-ALBUTEROL 0.5-2.5 (3) MG/3ML IN SOLN: 3.0000 mL | Freq: Four times a day (QID) | RESPIRATORY_TRACT | Status: DC | PRN

## 2021-10-18 NOTE — ED Notes (Signed)
Hospitalist took pt off his Bipap. Pts VSS ?

## 2021-10-18 NOTE — Progress Notes (Signed)
*  PRELIMINARY RESULTS* ?Echocardiogram ?2D Echocardiogram has been performed. ? ?Matthew [name] ?10/18/2021, 11:41 AM ?

## 2021-10-18 NOTE — Progress Notes (Signed)
PHARMACY - PHYSICIAN COMMUNICATION ?CRITICAL VALUE ALERT - BLOOD CULTURE IDENTIFICATION (BCID) ? ?Matthew [name] is an 71 y.o. male who presented to [facility] on 10/17/2021 with a chief complaint of shortness of breath ? ?Assessment:  blood culture from 3/6 with GPC in  1/4 bottles.  BCID detects MSSE.   ? ?Name of physician (or Provider) Contacted: Dr Si Raider ? ?Current antibiotics: none ? ?Changes to prescribed antibiotics recommended:  ?Recommendations accepted by provider - monitor off antibiotics as suspect contaminant ? ?Results for orders placed or performed during the hospital encounter of 10/17/21  ?Blood Culture ID Panel (Reflexed) (Collected: 10/17/2021  7:16 PM)  ?Result Value Ref Range  ? Enterococcus faecalis NOT DETECTED NOT DETECTED  ? Enterococcus Faecium NOT DETECTED NOT DETECTED  ? Listeria monocytogenes NOT DETECTED NOT DETECTED  ? Staphylococcus species DETECTED (A) NOT DETECTED  ? Staphylococcus aureus (BCID) NOT DETECTED NOT DETECTED  ? Staphylococcus epidermidis DETECTED (A) NOT DETECTED  ? Staphylococcus lugdunensis NOT DETECTED NOT DETECTED  ? Streptococcus species NOT DETECTED NOT DETECTED  ? Streptococcus agalactiae NOT DETECTED NOT DETECTED  ? Streptococcus pneumoniae NOT DETECTED NOT DETECTED  ? Streptococcus pyogenes NOT DETECTED NOT DETECTED  ? A.calcoaceticus-baumannii NOT DETECTED NOT DETECTED  ? Bacteroides fragilis NOT DETECTED NOT DETECTED  ? Enterobacterales NOT DETECTED NOT DETECTED  ? Enterobacter cloacae complex NOT DETECTED NOT DETECTED  ? Escherichia coli NOT DETECTED NOT DETECTED  ? Klebsiella aerogenes NOT DETECTED NOT DETECTED  ? Klebsiella oxytoca NOT DETECTED NOT DETECTED  ? Klebsiella pneumoniae NOT DETECTED NOT DETECTED  ? Proteus species NOT DETECTED NOT DETECTED  ? Salmonella species NOT DETECTED NOT DETECTED  ? Serratia marcescens NOT DETECTED NOT DETECTED  ? Haemophilus influenzae NOT DETECTED NOT DETECTED  ? Neisseria meningitidis NOT DETECTED NOT DETECTED  ?  Pseudomonas aeruginosa NOT DETECTED NOT DETECTED  ? Stenotrophomonas maltophilia NOT DETECTED NOT DETECTED  ? Candida albicans NOT DETECTED NOT DETECTED  ? Candida auris NOT DETECTED NOT DETECTED  ? Candida glabrata NOT DETECTED NOT DETECTED  ? Candida krusei NOT DETECTED NOT DETECTED  ? Candida parapsilosis NOT DETECTED NOT DETECTED  ? Candida tropicalis NOT DETECTED NOT DETECTED  ? Cryptococcus neoformans/gattii NOT DETECTED NOT DETECTED  ? Methicillin resistance mecA/C NOT DETECTED NOT DETECTED  ? ? ?Doreene Eland, PharmD, BCPS, BCIDP ?Work Cell: [phone] ?10/18/2021 2:31 PM ? ? ? ?

## 2021-10-18 NOTE — ED Notes (Signed)
IV team at the bedside. 

## 2021-10-18 NOTE — TOC Initial Note (Signed)
Transition of Care (TOC) - Initial/Assessment Note  ? ? ?Patient Details  ?Name: Matthew [name] ?MRN: [id number] ?Date of Birth: [birth year] ? ?Transition of Care (TOC) CM/SW Contact:    ?Shelbie Hutching, RN ?Phone Number: ?10/18/2021, 12:41 PM ? ?Clinical Narrative:                 ?Patient being admitted to the hospital for afib with rvr and acute respiratory failure on acute O2 at 2L.  RNCM met with patient at the bedside, introduced self and explained role.   ?Patient is from home with his wife, he is independent at home and sometimes carries a cane.  He does not drive but wife provides transportation.   ?[facility] but he missed his last appointment.   ?PCP is Dr. Lavera Guise. ?Patient reports he has had home health in the past but that the last time they were out there they didn't do anything for him so he canceled it.   ? ?Informed patient that it could be arranged again if he wanted to give it another try.   ? ?TOC will follow.  ? ? ?Expected Discharge Plan: Home/Self Care ?Barriers to Discharge: Continued Medical Work up ? ? ?Patient Goals and CMS Choice ?Patient states their goals for this hospitalization and ongoing recovery are:: wants to walk out of here- when medically ready ?  ?  ? ?Expected Discharge Plan and Services ?Expected Discharge Plan: Home/Self Care ?  ?Discharge Planning Services: CM Consult ?  ?Living arrangements for the past 2 months: [place] ?                ?DME Arranged: N/A ?DME Agency: NA ?  ?  ?  ?  ?  ?  ?  ?  ? ?Prior Living Arrangements/Services ?Living arrangements for the past 2 months: [place] ?Lives with:: Spouse ?Patient language and need for interpreter reviewed:: Yes ?Do you feel safe going back to the place where you live?: Yes      ?Need for Family Participation in Patient Care: Yes (Comment) ?Care giver support system in place?: Yes (comment) (wife) ?Current home services: DME (cane) ?Criminal Activity/Legal Involvement Pertinent to  Current Situation/Hospitalization: No - Comment as needed ? ?Activities of Daily Living ?  ?  ? ?Permission Sought/Granted ?Permission sought to share information with : Case Manager, Family Supports ?Permission granted to share information with : Yes, Verbal Permission Granted ? Share Information with NAME: Franki Stemen ?   ? Permission granted to share info w Relationship: wife ? Permission granted to share info w Contact Information: [phone] ? ?Emotional Assessment ?Appearance:: Appears stated age ?Attitude/Demeanor/Rapport: Engaged ?Affect (typically observed): Accepting ?Orientation: : Oriented to Self, Oriented to Place, Oriented to  Time, Oriented to Situation ?Alcohol / Substance Use: Not Applicable ?Psych Involvement: No (comment) ? ?Admission diagnosis:  Rapid atrial fibrillation ([name]) [I48.91] ?Patient Active Problem List  ? Diagnosis Date Noted  ? Rapid atrial fibrillation ([name]) 10/17/2021  ? Obesity, Class III, BMI 40-49.9 (morbid obesity) ([name]) 10/17/2021  ? OSA (obstructive sleep apnea) 10/17/2021  ? Anemia due to vitamin B12 deficiency   ? Delirium   ? Hyperkalemia   ? Heart failure with preserved ejection fraction ([name])   ? CKD (chronic kidney disease) stage 4, GFR 15-29 ml/min (HCC)   ? Vitamin B12 deficiency   ? Depression   ? Atrial fibrillation with rapid ventricular response ([place]) 07/11/2021  ? Anemia of chronic disease  07/11/2021  ? Asthma exacerbation 07/03/2021  ? Chest pain 07/03/2021  ? Acute gout   ? Acute on chronic diastolic CHF (congestive heart failure) ([name])   ? Right leg pain   ? Weakness   ? SOB (shortness of breath) 06/12/2021  ? Bilateral leg edema 06/12/2021  ? Atrial fibrillation with RVR ([name]) 06/12/2021  ? AKI (acute kidney injury) ([name]) 06/12/2021  ? Elevated glucose level 06/12/2021  ? Obesity (BMI 30-39.9) 06/12/2021  ? Macrocytosis 06/12/2021  ? Acute on chronic respiratory failure with hypoxia ([name]) 06/12/2021  ? Chronic pain of left knee 12/16/2020  ? COPD  (chronic obstructive pulmonary disease) ([place]) 12/04/2020  ? Acute right hip pain 12/04/2020  ? H/O bilateral hip replacements 12/04/2020  ? Stage 3b chronic kidney disease ([name]) 12/04/2020  ? Displaced comminuted fracture of shaft of left femur, initial encounter for closed fracture ([name]) 05/19/2020  ? Asthma 05/18/2020  ? Hypertension 05/18/2020  ? Femur fracture, left ([name]) 05/18/2020  ? Closed comminuted intra-articular fracture of distal femur ([name]) 05/17/2020  ? Moderate persistent asthma with exacerbation 10/15/2018  ? ?PCP:  [name], MD ?Pharmacy:   ?[facility] [place] [facility] ?[place] ?[name] ?Phone: [phone] Fax: [phone] ? ?[id number] [name] - [address] ?[address] ?[id number]?Phone: [phone]: [phone]? ? ? ? ?Social Determinants of Health (SDOH) Interventions ?  ? ?Readmission Risk Interventions ?Readmission Risk Prevention Plan 10/18/2021  ?Transportation Screening Complete  ?Medication Review (Press photographer Complete  ?PCP or Specialist appointment within 3-5 days of discharge Complete  ?[facility] Consult Complete  ?SW Recovery Care/Counseling Consult Complete  ?Palliative Care Screening Not Applicable  ?SRumaNot Applicable  ?Some recent data might be hidden  ? ? ? ?

## 2021-10-18 NOTE — Progress Notes (Addendum)
PROGRESS NOTE    KAZUMA ELENA  VEL:[id number] DOB: [birth year] DOA: 10/17/2021 PCP: [name], MD  Outpatient Specialists: none, hasn't established    Brief Narrative:   From admission h and p ROMUALDO PROSISE is a 71 y.o. male with medical history significant for COPD, OSA not on CPAP, prior home O2 use, class IV obesity, CKD stage IV, HTN, A-fib on Eliquis, diastolic CHF with last exacerbation in October 2022, who presents to the ED by EMS with respiratory distress, arriving on CPAP.  Patient states he was in his usual state of health until the day prior when he started having shortness of breath which significantly worsened over the next 24 hours.  He has associated nonradiating retrosternal tightness, worse with breathing.  Denies lower extremity edema.  Has some orthopnea.  Has a nonproductive cough but no fever or chills.  No affected contacts ED course: On arrival tachycardic to 138, tachypneic to 29 with BP 99/74 and O2 sat of 100% on CPAP with increased work of breathing.  Patient transitioned to BiPAP on arrival   Assessment & Plan:   Principal Problem:   Rapid atrial fibrillation (HCC) Active Problems:   Acute on chronic respiratory failure with hypoxia (HCC)   Acute on chronic diastolic CHF (congestive heart failure) (HCC)   Hypertension   COPD (chronic obstructive pulmonary disease) (HCC)   CKD (chronic kidney disease) stage 4, GFR 15-29 ml/min (HCC)   Obesity, Class III, BMI 40-49.9 (morbid obesity) (HCC)   OSA (obstructive sleep apnea)   # A-fib with RVR In the setting of medication non-compliance. Started on amio infusion in the ED. HR improved but still elevated to 120s. No ACS. Causing CHF exacerbation. - cont a-fib - mgmt per cardiology - cont home eliquis  # Positive blood culture 1/4 with s epi, likely contaminant, will repeat another set of blood cultures  # HFrEF with exacerbation 2/2 a-fib with RVR. BNP elevated. Not ACS. BP low normal - lasix 20 iv  bid for home 20 oral - a-fib as above - strict I/os, daily weights  # Acute respiratory failure Arrived on cpap, maintained on bipap overnight. O2 of 88 documented in the ED. This morning bipap removed and patient breathing comfortably, maintaining O2 in the mid-90s during my interview. Has been discharged on O2 in the past though not recently requiring - monitor, supplement O2 as needed   # HTN Here bp low normal  # COPD Does not appear to be in an exacerbation. CXR clear, denies fever or cough. Think respiratory problems are primarily driven by a-fib. Procal elevated but patient has ckd4 - stop doxycycline - cont home dulera, prn duonebs - monitor  # CKD stage 4 GFR is in the upper 20s which appears to be his new baseline - monitor and renally-dose meds  # OSA Not on cpap at home but needs to be - cpap qhs ordered  # BPH Able to urinate - cont flomax  # MDD - home mirtazapine, duloxetine  # Gout With recent flare. Hasn't been taking home allopurinol - consider rx at d/c if he's interested in taking  # Possible recent GI bleed # Anemia 4 point hgb drop recent hospitalization. Here no report of bleeding. Hgb of 10.1 is an improvement over recent checks - monitor  DVT prophylaxis: home apixaban Code Status: full Family Communication: none @ bedside. No answer when wife called  Level of care: progressive Status is: Inpatient Remains inpatient appropriate because: severity of illness  Consultants:  cardiology  Procedures: none  Antimicrobials:  S/p doxycycline    Subjective: Breathing improved. No chest pain. No cough or fever.  Objective: Vitals:   10/18/21 0445 10/18/21 0530 10/18/21 0630 10/18/21 0645  BP: 104/80 118/78 107/77 90/71  Pulse: (!) 135 (!) 118 (!) 119 99  Resp: (!) 21 19 (!) 24 (!) 21  Temp:      TempSrc:      SpO2: 100% 100% 97% 100%  Weight:      Height:        Intake/Output Summary (Last 24 hours) at 10/18/2021 0905 Last  data filed at 10/18/2021 0548 Gross per 24 hour  Intake 637.71 ml  Output 600 ml  Net 37.71 ml   Filed Weights   10/17/21 1851  Weight: 129 kg    Examination:  General exam: Appears calm and comfortable, chronically ill appearing  Respiratory system: rales at bases Cardiovascular system: distant sounds, irreg irreg, tachycardic Gastrointestinal system: Abdomen is obese, soft and nontender. No organomegaly or masses felt. Normal bowel sounds heard. Central nervous system: Alert and oriented. No focal neurological deficits. Extremities: Symmetric 5 x 5 power. 1+ edema Skin: No rashes, lesions or ulcers Psychiatry: Judgement and insight appear normal. Mood & affect appropriate.     Data Reviewed: I have personally reviewed following labs and imaging studies  CBC: Recent Labs  Lab 10/17/21 1915  WBC 8.0  NEUTROABS 5.8  HGB 10.1*  HCT 36.6*  MCV 97.1  PLT 102   Basic Metabolic Panel: Recent Labs  Lab 10/17/21 1915  NA 139  K 4.4  CL 106  CO2 23  GLUCOSE 120*  BUN 30*  CREATININE 2.45*  CALCIUM 9.7   GFR: Estimated Creatinine Clearance: 35.2 mL/min (A) (by C-G formula based on SCr of 2.45 mg/dL (H)). Liver Function Tests: Recent Labs  Lab 10/17/21 1915  AST 12*  ALT 8  ALKPHOS 58  BILITOT 0.6  PROT 7.3  ALBUMIN 2.4*   No results for input(s): LIPASE, AMYLASE in the last 168 hours. No results for input(s): AMMONIA in the last 168 hours. Coagulation Profile: No results for input(s): INR, PROTIME in the last 168 hours. Cardiac Enzymes: No results for input(s): CKTOTAL, CKMB, CKMBINDEX, TROPONINI in the last 168 hours. BNP (last 3 results) No results for input(s): PROBNP in the last 8760 hours. HbA1C: No results for input(s): HGBA1C in the last 72 hours. CBG: No results for input(s): GLUCAP in the last 168 hours. Lipid Profile: No results for input(s): CHOL, HDL, LDLCALC, TRIG, CHOLHDL, LDLDIRECT in the last 72 hours. Thyroid Function Tests: No  results for input(s): TSH, T4TOTAL, FREET4, T3FREE, THYROIDAB in the last 72 hours. Anemia Panel: No results for input(s): VITAMINB12, FOLATE, FERRITIN, TIBC, IRON, RETICCTPCT in the last 72 hours. Urine analysis:    Component Value Date/Time   COLORURINE YELLOW (A) 07/23/2021 1537   APPEARANCEUR HAZY (A) 07/23/2021 1537   LABSPEC 1.009 07/23/2021 1537   PHURINE 5.0 07/23/2021 1537   GLUCOSEU NEGATIVE 07/23/2021 1537   HGBUR NEGATIVE 07/23/2021 1537   BILIRUBINUR NEGATIVE 07/23/2021 1537   KETONESUR NEGATIVE 07/23/2021 1537   PROTEINUR NEGATIVE 07/23/2021 1537   NITRITE NEGATIVE 07/23/2021 1537   LEUKOCYTESUR LARGE (A) 07/23/2021 1537   Sepsis Labs: '@LABRCNTIP'$ (procalcitonin:4,lacticidven:4)  ) Recent Results (from the past 240 hour(s))  Resp Panel by RT-PCR (Flu A&B, Covid) Nasopharyngeal Swab     Status: None   Collection Time: 10/17/21  7:16 PM   Specimen: Nasopharyngeal Swab; Nasopharyngeal(NP) swabs in vial  transport medium  Result Value Ref Range Status   SARS Coronavirus 2 by RT PCR NEGATIVE NEGATIVE Final    Comment: (NOTE) SARS-CoV-2 target nucleic acids are NOT DETECTED.  The SARS-CoV-2 RNA is generally detectable in upper respiratory specimens during the acute phase of infection. The lowest concentration of SARS-CoV-2 viral copies this assay can detect is 138 copies/mL. A negative result does not preclude SARS-Cov-2 infection and should not be used as the sole basis for treatment or other patient management decisions. A negative result may occur with  improper specimen collection/handling, submission of specimen other than nasopharyngeal swab, presence of viral mutation(s) within the areas targeted by this assay, and inadequate number of viral copies(<138 copies/mL). A negative result must be combined with clinical observations, patient history, and epidemiological information. The expected result is Negative.  Fact Sheet for Patients:   EntrepreneurPulse.com.au  Fact Sheet for Healthcare Providers:  IncredibleEmployment.be  This test is no t yet approved or cleared by the [place] FDA and  has been authorized for detection and/or diagnosis of SARS-CoV-2 by FDA under an Emergency Use Authorization (EUA). This EUA will remain  in effect (meaning this test can be used) for the duration of the COVID-19 declaration under Section 564(b)(1) of the Act, 21 U.S.C.section 360bbb-3(b)(1), unless the authorization is terminated  or revoked sooner.       Influenza A by PCR NEGATIVE NEGATIVE Final   Influenza B by PCR NEGATIVE NEGATIVE Final    Comment: (NOTE) The Xpert Xpress SARS-CoV-2/FLU/RSV plus assay is intended as an aid in the diagnosis of influenza from Nasopharyngeal swab specimens and should not be used as a sole basis for treatment. Nasal washings and aspirates are unacceptable for Xpert Xpress SARS-CoV-2/FLU/RSV testing.  Fact Sheet for Patients: EntrepreneurPulse.com.au  Fact Sheet for Healthcare Providers: IncredibleEmployment.be  This test is not yet approved or cleared by the [place] FDA and has been authorized for detection and/or diagnosis of SARS-CoV-2 by FDA under an Emergency Use Authorization (EUA). This EUA will remain in effect (meaning this test can be used) for the duration of the COVID-19 declaration under Section 564(b)(1) of the Act, 21 U.S.C. section 360bbb-3(b)(1), unless the authorization is terminated or revoked.  Performed at [facility], [address] [place], [place] [address]   Blood culture (routine x 2)     Status: None (Preliminary result)   Collection Time: 10/17/21  7:16 PM   Specimen: BLOOD  Result Value Ref Range Status   Specimen Description BLOOD LEFT WRIST  Final   Special Requests   Final    BOTTLES DRAWN AEROBIC AND ANAEROBIC Blood Culture adequate volume   Culture    Final    NO GROWTH < 12 HOURS Performed at [facility], [address] [place], [place] [address]    Report Status PENDING  Incomplete  Blood culture (routine x 2)     Status: None (Preliminary result)   Collection Time: 10/17/21  7:16 PM   Specimen: BLOOD  Result Value Ref Range Status   Specimen Description BLOOD RIGHT ARM  Final   Special Requests   Final    BOTTLES DRAWN AEROBIC AND ANAEROBIC Blood Culture results may not be optimal due to an inadequate volume of blood received in culture bottles   Culture   Final    NO GROWTH < 12 HOURS Performed at [facility], [address] [place], [place] [address]    Report Status PENDING  Incomplete  Radiology Studies: DG Chest Portable 1 View  Result Date: 10/17/2021 CLINICAL DATA:  Shortness of breath EXAM: PORTABLE CHEST 1 VIEW COMPARISON:  07/23/2021 FINDINGS: The heart size and mediastinal contours are within normal limits. Both lungs are clear. Old right-sided rib fracture. IMPRESSION: No active disease. Electronically Signed   By: Donavan Foil M.D.   On: 10/17/2021 19:39        Scheduled Meds:  apixaban  5 mg Oral BID   DULoxetine  60 mg Oral BID   furosemide  20 mg Intravenous Q12H   ipratropium  0.5 mg Nebulization Q6H   mirtazapine  15 mg Oral QHS   mometasone-formoterol  2 puff Inhalation BID   tamsulosin  0.4 mg Oral QPC supper   Continuous Infusions:  amiodarone 30 mg/hr (10/18/21 0654)     LOS: 1 day    Time spent: 47 min    Desma Maxim, MD [facility]   If 7PM-7AM, please contact night-coverage [email or web address] Password [email or web address] 10/18/2021, 9:05 AM

## 2021-10-18 NOTE — Consult Note (Signed)
Cardiology Consultation:   Patient ID: Matthew [name] MRN: [id number]; DOB: 22-Sep-1950  Admit date: 10/17/2021 Date of Consult: 10/18/2021  PCP:  Matthew Athens, MD   [facility] Providers Cardiologist:  Nelva Bush, MD        Patient Profile:   Matthew [name] is a 71 y.o. male with a hx of HTN, asthma, obesity, chronic respiratory failure on 2L O2, CKD stage 3, Afib on Eliquis, OSA not on CPAP who is being seen 10/18/2021 for the evaluation of rapid afib at the request of Dr. Si Raider.  History of Present Illness:   Matthew [name] does not regularly follow with cardiology. He saw Dr. Clayborn Bigness in 2020. He did a cath that showed no significant CAD. Echo at that time showed LVEF >65% and no WMA.   Echo 06/13/21 showed LVEF 50-55%, no WMA, mildly dilated LA, trivial MR, aortic dilation measuring 41m.  Last seen December 2022 during hospitalization for afib RV, acute anemia, HFmrEF, respiratory failure. Eliquis was held for 4 unit drop in Hgb. He denied GI work-up. He was diuresed, Kidney function limited due to GDMT. Eliquis was resumed with plan for OP follow-up. He was discharged to SNF.  Saw Matthew Price12/21/22 with SOB . No changes were made.   He did not follow-up with Matthew Faithfor hospital follow-up.   The patient presented to the ER for shortness of breath he reports worsening symptoms for the last few days. Unsure if he was taking all his prescribed medications. Denied chest pain, did have some left flank pain. Also noted racing heart. He was brought in by EMS and arrived on Bipap. They reported he was hypoxic, diaphoretic and obvious respiratory distress.  No recent LLE, orthopnea, or pnd.  In the ER BP 122/66, pulse 99, RR 27. Labs showed Hgb 10.1, Scr 2.45, BUN 30, albumin 2.4, AST 12. BNP 691. LA wnl. Respiratory panel negative. CXR showed no active disease. EKG showed afib RVR with rates in the 160s. He was placed on amiodarone drip and admitted.    Past Medical History:   Diagnosis Date   Anemia    Anxiety    Arrhythmia    atrial fibrillation   Asthma    CAD (coronary artery disease)    CHF (congestive heart failure) (HCC)    Chronic kidney disease    COPD (chronic obstructive pulmonary disease) (HCC)    Depression    Edema    Gout    Hip dislocation, bilateral ([place]    Hypertension    Osteoporosis    Sleep apnea     Past Surgical History:  Procedure Laterality Date   ABDOMINAL SURGERY     CHOLECYSTECTOMY     COLOSTOMY     COLOSTOMY TAKEDOWN     HERNIA REPAIR     LEFT HEART CATH AND CORONARY ANGIOGRAPHY N/A 10/17/2018   Procedure: LEFT HEART CATH AND CORONARY ANGIOGRAPHY with possible PCI and stent;  Surgeon: CYolonda Kida MD;  [facility];  Service: Cardiovascular;  Laterality: N/A;   ORIF FEMUR FRACTURE Left 05/19/2020   Procedure: OPEN REDUCTION INTERNAL FIXATION (ORIF) DISTAL FEMUR FRACTURE;  Surgeon: HShona Needles MD;  [place]  Service: Orthopedics;  Laterality: Left;   TOTAL HIP ARTHROPLASTY Bilateral      Home Medications:  Prior to Admission medications   Medication Sig Start Date End Date Taking? Authorizing Provider  albuterol (VENTOLIN HFA) 108 (90 Base) MCG/ACT inhaler INHALE 1 PUFF BY MOUTH INTO LUNGS  DAILY 09/28/21  Yes Masoud, Viann Shove, MD  bisacodyl (DULCOLAX) 5 MG EC tablet Take 1 tablet (5 mg total) by mouth daily as needed for moderate constipation. 09/14/21  Yes Masoud, Viann Shove, MD  DULoxetine (CYMBALTA) 60 MG capsule Take 1 capsule (60 mg total) by mouth 2 (two) times daily. 09/14/21  Yes Masoud, Viann Shove, MD  ELIQUIS 5 MG TABS tablet TAKE 1 TABLET(5 MG) BY MOUTH TWICE DAILY 09/22/21  Yes Masoud, Viann Shove, MD  furosemide (LASIX) 20 MG tablet TAKE 1 TABLET(20 MG) BY MOUTH DAILY 09/20/21  Yes Masoud, Viann Shove, MD  ipratropium (ATROVENT) 0.02 % nebulizer solution SMARTSIG:3 Milliliter(s) Via Nebulizer Every 6 Hours PRN 08/05/21  Yes [provider]  ipratropium-albuterol (DUONEB) 0.5-2.5 (3) MG/3ML SOLN  USE 1 VIAL VIA NEBULIZER EVERY 6 HOURS AS NEEDED 10/13/21  Yes Masoud, Viann Shove, MD  melatonin 5 MG TABS Take 1 tablet (5 mg total) by mouth at bedtime. 06/23/21  Yes Richarda Osmond, MD  metoprolol tartrate (LOPRESSOR) 50 MG tablet Take 1 tablet (50 mg total) by mouth 2 (two) times daily. 09/14/21 10/17/21 Yes Masoud, Viann Shove, MD  MIRALAX 17 GM/SCOOP powder SMARTSIG:17 Gram(s) By Mouth Daily PRN 08/05/21  Yes [provider]  mirtazapine (REMERON) 15 MG tablet Take 1 tablet (15 mg total) by mouth at bedtime. 09/14/21  Yes Masoud, Viann Shove, MD  acetaminophen (TYLENOL) 325 MG tablet Take 2 tablets (650 mg total) by mouth every 4 (four) hours as needed for headache or mild pain. Patient not taking: Reported on 10/17/2021 07/18/21   Nicole Kindred A, DO  allopurinol (ZYLOPRIM) 100 MG tablet Take 1 tablet (100 mg total) by mouth daily. Patient not taking: Reported on 10/17/2021 09/14/21   Matthew Athens, MD  colchicine 0.6 MG tablet Take 0.5 tablets (0.3 mg total) by mouth daily. Patient not taking: Reported on 10/17/2021 06/24/21   Richarda Osmond, MD  diltiazem (CARDIZEM CD) 300 MG 24 hr capsule Take 1 capsule (300 mg total) by mouth daily. Patient not taking: Reported on 10/17/2021 09/14/21 12/13/21  Matthew Athens, MD  hydrOXYzine (ATARAX) 25 MG tablet Take 25 mg by mouth every 6 (six) hours as needed. Patient not taking: Reported on 10/17/2021    [provider]  mometasone-formoterol (DULERA) 200-5 MCG/ACT AERO Inhale 2 puffs into the lungs 2 (two) times daily. Patient not taking: Reported on 10/17/2021 09/14/21   Matthew Athens, MD  Multiple Vitamin (MULTIVITAMIN WITH MINERALS) TABS tablet Take 1 tablet by mouth daily. Patient not taking: Reported on 10/17/2021 07/07/21   Enzo Bi, MD  polyethylene glycol (MIRALAX / GLYCOLAX) 17 g packet Take 17 g by mouth daily as needed for mild constipation. Patient not taking: Reported on 10/17/2021 06/23/21   Richarda Osmond, MD  potassium chloride SA (KLOR-CON M)  20 MEQ tablet Take 20 mEq by mouth daily. Patient not taking: Reported on 10/17/2021    [provider]  tamsulosin (FLOMAX) 0.4 MG CAPS capsule Take 1 capsule (0.4 mg total) by mouth daily after supper. Patient not taking: Reported on 10/17/2021 09/14/21 12/13/21  Matthew Athens, MD  vitamin B-12 1000 MCG tablet Take 1 tablet (1,000 mcg total) by mouth daily. Patient not taking: Reported on 10/17/2021 07/19/21   Ezekiel Slocumb, DO    Inpatient Medications: Scheduled Meds:  apixaban  5 mg Oral BID   DULoxetine  60 mg Oral BID   furosemide  20 mg Intravenous Q12H   ipratropium  0.5 mg Nebulization Q6H   mirtazapine  15 mg Oral QHS   [  START ON 10/19/2021] mometasone-formoterol  2 puff Inhalation BID   tamsulosin  0.4 mg Oral QPC supper   Continuous Infusions:  amiodarone 30 mg/hr (10/18/21 0942)   PRN Meds: acetaminophen, ipratropium-albuterol, ondansetron (ZOFRAN) IV  Allergies:    Allergies  Allergen Reactions   Demerol [Meperidine Hcl]    Lisinopril Other (See Comments)    Hypotensive    Meperidine And Related     Social History:   Social History   Socioeconomic History   Marital status: Married    Spouse name: Not on file   Number of children: Not on file   Years of education: Not on file   Highest education level: Not on file  Occupational History   Not on file  Tobacco Use   Smoking status: Never   Smokeless tobacco: Never  Vaping Use   Vaping Use: Never used  Substance and Sexual Activity   Alcohol use: Not Currently   Drug use: Not on file   Sexual activity: Not on file  Other Topics Concern   Not on file  Social History Narrative   Not on file   Social Determinants of Health   Financial Resource Strain: Not on file  Food Insecurity: Not on file  Transportation Needs: Not on file  Physical Activity: Not on file  Stress: Not on file  Social Connections: Not on file  Intimate Partner Violence: Not on file    Family History:    Family History   Problem Relation Age of Onset   COPD Mother    Cancer Mother    Melanoma Father      ROS:  Please see the history of present illness.   All other ROS reviewed and negative.     Physical Exam/Data:   Vitals:   10/18/21 0830 10/18/21 0900 10/18/21 0930 10/18/21 0951  BP: 108/72 134/73 (!) 87/75 (!) 146/112  Pulse: 87 (!) 56 (!) 128 (!) 128  Resp: 20   (!) 22  Temp:      TempSrc:      SpO2: 100% 92% 92% 100%  Weight:      Height:        Intake/Output Summary (Last 24 hours) at 10/18/2021 1239 Last data filed at 10/18/2021 0548 Gross per 24 hour  Intake 637.71 ml  Output 600 ml  Net 37.71 ml   Last 3 Weights 10/17/2021 09/14/2021 07/23/2021  Weight (lbs) 284 lb 6.3 oz 285 lb 280 lb  Weight (kg) 129 kg 129.275 kg 127.007 kg     Body mass index is 45.9 kg/m.  General:  Well nourished, well developed, in no acute distress HEENT: normal Neck: no JVD Vascular: No carotid bruits; Distal pulses 2+ bilaterally Cardiac:  normal S1, S2; Irreg Irreg; no murmur  Lungs:  diffusely diminished  Abd: soft, nontender, no hepatomegaly  Ext: no edema Musculoskeletal:  No deformities, BUE and BLE strength normal and equal Skin: warm and dry  Neuro:  CNs 2-12 intact, no focal abnormalities noted Psych:  Normal affect   EKG:  The EKG was personally reviewed and demonstrates:  Afib RVR 165, rate related changes Telemetry:  Telemetry was personally reviewed and demonstrates:  Afib HR 130-140s  Relevant CV Studies:  Echo ordered  Echo 05/2021  1. AF with RVR precludes accurate EF assessment. Left ventricular  ejection fraction, by estimation, is 50 to 55%. The left ventricle has low  normal function. The left ventricle has no regional wall motion  abnormalities. Left ventricular diastolic  parameters are  indeterminate.   2. Right ventricular systolic function is normal. The right ventricular  size is normal.   3. Left atrial size was mildly dilated.   4. The mitral valve is normal in  structure. Trivial mitral valve  regurgitation.   5. The aortic valve is normal in structure. Aortic valve regurgitation is  not visualized. No aortic stenosis is present.   6. Aortic dilatation noted. There is mild dilatation of the aortic root,  measuring 41 mm.     LHC 10/17/2018 Conclusion     Normal left ventricular function Normal coronaries   Conclusion Normal cardiac cath via right radial No evidence coronary disease Troponin represents demand ischemia not non-STEMI  Laboratory Data:  High Sensitivity Troponin:   Recent Labs  Lab 10/17/21 1915 10/17/21 2116  TROPONINIHS 15 13     Chemistry Recent Labs  Lab 10/17/21 1915 10/18/21 1004  NA 139 138  K 4.4 4.2  CL 106 107  CO2 23 24  GLUCOSE 120* 108*  BUN 30* 31*  CREATININE 2.45* 2.48*  CALCIUM 9.7 8.9  GFRNONAA 27* 27*  ANIONGAP 10 7    Recent Labs  Lab 10/17/21 1915  PROT 7.3  ALBUMIN 2.4*  AST 12*  ALT 8  ALKPHOS 58  BILITOT 0.6   Lipids No results for input(s): CHOL, TRIG, HDL, LABVLDL, LDLCALC, CHOLHDL in the last 168 hours.  Hematology Recent Labs  Lab 10/17/21 1915  WBC 8.0  RBC 3.77*  HGB 10.1*  HCT 36.6*  MCV 97.1  MCH 26.8  MCHC 27.6*  RDW 14.6  PLT 333   Thyroid No results for input(s): TSH, FREET4 in the last 168 hours.  BNP Recent Labs  Lab 10/17/21 1916  BNP 691.7*    DDimer No results for input(s): DDIMER in the last 168 hours.   Radiology/Studies:  DG Chest Portable 1 View  Result Date: 10/17/2021 CLINICAL DATA:  Shortness of breath EXAM: PORTABLE CHEST 1 VIEW COMPARISON:  07/23/2021 FINDINGS: The heart size and mediastinal contours are within normal limits. Both lungs are clear. Old right-sided rib fracture. IMPRESSION: No active disease. Electronically Signed   By: Donavan Foil M.D.   On: 10/17/2021 19:39     Assessment and Plan:   Afib RVR - in the setting of medication noncompliance - IV amiodarone with no improvement of rates - continue Eliquis '5mg'$   BID, we are unsure if he missed any doses - rates still elevated  - PTA Cardizem CD '300mg'$  daily and Lopressor '50mg'$  BID - stop amiodarone and started IV cardizem - echo ordered - may need TEE/DCCV if he doesn't self convert  Acute on chronic systolic heart failure - BNP 691 - PTA lasix '20mg'$  daily - IV lasix '20mg'$  BID - difficult to assesses volume status given body habitus - Echo 05/2021 showed LVEF 50-55%. Repeat echo ordered - monitor strict I/Os, daily weights and creatinine  Acute respiratory failure - came in on Bipap, now off - CXR negative - wean supplemental O2 as able  CKD stage 4 - Scr at baseline - monitor with IV lasix  HTN - IV dilt as above  OSA - needs CPAP at home  Possible recent GIB - Hgb this admission 10.1 - refused GI work-up last admission  For questions or updates, please contact [place] Please consult [email or web address] for contact info under    Signed, Haille Pardi Ninfa Meeker, PA-C  10/18/2021 12:39 PM

## 2021-10-18 NOTE — Plan of Care (Signed)

## 2021-10-18 NOTE — Progress Notes (Signed)
PT Cancellation Note ? ?Patient Details ?Name: Matthew [name] ?MRN: [id number] ?DOB: 05/02/1951 ? ? ?Cancelled Treatment:    Reason Eval/Treat Not Completed: Other (comment). order received. Chart reviewed. Pt noted with new initiation of Amiodarone. HR at most recent recorded value in chart 128 at rest. Per secure chat with MD, plan to hold PT evaluation this date. Will re-attempt next date as medically indicated.  ? ? ?Yaffa Seckman ?10/18/2021, 10:23 AM ?Greggory Stallion, PT, DPT, GCS ?[phone] ? ?

## 2021-10-18 NOTE — ED Notes (Signed)
Pt continues to have increased work in breathing after neb tx and Lasix. MD notified and aware. RT called. Sats maintaining at 98-100% on 5L/O2 nasal cannula ?

## 2021-10-18 NOTE — TOC Progression Note (Signed)
Transition of Care (TOC) - Progression Note  ? ? ?Patient Details  ?Name: Matthew [name] ?MRN: [id number] ?Date of Birth: 08-Oct-1950 ? ?Transition of Care (TOC) CM/SW Contact  ?Shelbie Hutching, RN ?Phone Number: ?10/18/2021, 12:57 PM ? ?Clinical Narrative:    ?House in the past was [facility].  Patient has also been to [facility] in the past for rehab.   ? ?Expected Discharge Plan: Home/Self Care ?Barriers to Discharge: Continued Medical Work up ? ?Expected Discharge Plan and Services ?Expected Discharge Plan: Home/Self Care ?  ?Discharge Planning Services: CM Consult ?  ?Living arrangements for the past 2 months: [place] ?                ?DME Arranged: N/A ?DME Agency: NA ?  ?  ?  ?  ?  ?  ?  ?  ? ? ?Social Determinants of Health (SDOH) Interventions ?  ? ?Readmission Risk Interventions ?Readmission Risk Prevention Plan 10/18/2021  ?Transportation Screening Complete  ?Medication Review Press photographer) Complete  ?PCP or Specialist appointment within 3-5 days of discharge Complete  ?[place] or Home Care Consult Complete  ?SW Recovery Care/Counseling Consult Complete  ?Palliative Care Screening Not Applicable  ?[place] Not Applicable  ?Some recent data might be hidden  ? ? ?

## 2021-10-18 NOTE — Progress Notes (Signed)
OT Cancellation Note ? ?Patient Details ?Name: Matthew [name] ?MRN: [id number] ?DOB: [birth year] ? ? ?Cancelled Treatment:    Reason Eval/Treat Not Completed: Medical issues which prohibited therapy. OT order received. Chart reviewed. Pt noted with new initiation of Amiodarone. HR at most recent recorded value in chart 128 at rest. Per secure chat with MD, plan to hold OT evaluation this date. Will re-attempt next date as medically indicated.  ? ?Shara Blazing, M.S., OTR/L ?[facility] ?Ascom: [phone] ?10/18/21, 10:12 AM ? ? ? ?

## 2021-10-19 DIAGNOSIS — I420 Dilated cardiomyopathy: Secondary | ICD-10-CM

## 2021-10-19 DIAGNOSIS — N184 Chronic kidney disease, stage 4 (severe): Secondary | ICD-10-CM | POA: Diagnosis not present

## 2021-10-19 DIAGNOSIS — I428 Other cardiomyopathies: Secondary | ICD-10-CM

## 2021-10-19 DIAGNOSIS — I5023 Acute on chronic systolic (congestive) heart failure: Secondary | ICD-10-CM

## 2021-10-19 DIAGNOSIS — I4891 Unspecified atrial fibrillation: Secondary | ICD-10-CM | POA: Diagnosis not present

## 2021-10-19 DIAGNOSIS — J9621 Acute and chronic respiratory failure with hypoxia: Secondary | ICD-10-CM | POA: Diagnosis not present

## 2021-10-19 LAB — CBC
HCT: 29.8 % — ABNORMAL LOW (ref 39.0–52.0)
Hemoglobin: 8.3 g/dL — ABNORMAL LOW (ref 13.0–17.0)
MCH: 26.5 pg (ref 26.0–34.0)
MCHC: 27.9 g/dL — ABNORMAL LOW (ref 30.0–36.0)
MCV: 95.2 fL (ref 80.0–100.0)
Platelets: 255 10*3/uL (ref 150–400)
RBC: 3.13 MIL/uL — ABNORMAL LOW (ref 4.22–5.81)
RDW: 14.3 % (ref 11.5–15.5)
WBC: 5.2 10*3/uL (ref 4.0–10.5)
nRBC: 0 % (ref 0.0–0.2)

## 2021-10-19 LAB — BASIC METABOLIC PANEL
Anion gap: 8 (ref 5–15)
BUN: 34 mg/dL — ABNORMAL HIGH (ref 8–23)
CO2: 25 mmol/L (ref 22–32)
Calcium: 9.3 mg/dL (ref 8.9–10.3)
Chloride: 104 mmol/L (ref 98–111)
Creatinine, Ser: 2.79 mg/dL — ABNORMAL HIGH (ref 0.61–1.24)
GFR, Estimated: 23 mL/min — ABNORMAL LOW (ref >60)
Glucose, Bld: 121 mg/dL — ABNORMAL HIGH (ref 70–99)
Potassium: 4.2 mmol/L (ref 3.5–5.1)
Sodium: 137 mmol/L (ref 135–145)

## 2021-10-19 LAB — GLUCOSE, CAPILLARY: Glucose-Capillary: 120 mg/dL — ABNORMAL HIGH (ref 70–99)

## 2021-10-19 LAB — HIV ANTIBODY (ROUTINE TESTING W REFLEX): HIV Screen 4th Generation wRfx: NONREACTIVE

## 2021-10-19 MED ORDER — LEVALBUTEROL HCL 0.63 MG/3ML IN NEBU
0.6300 mg | INHALATION_SOLUTION | Freq: Three times a day (TID) | RESPIRATORY_TRACT | Status: DC
  Administered 2021-10-19 – 2021-10-23 (×12): 0.63 mg via RESPIRATORY_TRACT
  Filled 2021-10-19 (×12): qty 3

## 2021-10-19 MED ORDER — FUROSEMIDE 10 MG/ML IJ SOLN
40.0000 mg | Freq: Every day | INTRAMUSCULAR | Status: DC
  Administered 2021-10-20: 09:00:00 40 mg via INTRAVENOUS
  Filled 2021-10-19: qty 4

## 2021-10-19 MED ORDER — ADULT MULTIVITAMIN W/MINERALS CH
1.0000 | ORAL_TABLET | Freq: Every day | ORAL | Status: DC
  Administered 2021-10-19 – 2021-10-29 (×11): 1 via ORAL
  Filled 2021-10-19 (×11): qty 1

## 2021-10-19 NOTE — Progress Notes (Signed)
PROGRESS NOTE    Matthew [name]  PPJ:[id number] DOB: 12-07-1950 DOA: 10/17/2021 PCP: [name], MD  Assessment & Plan:   Principal Problem:   Rapid atrial fibrillation ([place]) Active Problems:   Acute on chronic respiratory failure with hypoxia (HCC)   Acute on chronic diastolic CHF (congestive heart failure) (HCC)   Hypertension   COPD (chronic obstructive pulmonary disease) (HCC)   CKD (chronic kidney disease) stage 4, GFR 15-29 ml/min (HCC)   Obesity, Class III, BMI 40-49.9 (morbid obesity) (HCC)   OSA (obstructive sleep apnea)   PAF: w/ RVR. Likely secondary to medication non-compliance. Continue on metoprolol, eliquis.    Unlikely bacteremia: 1/4 with s epi, likely contaminant. Repeat blood cxs NGTD   Acute on chronic systolic CHF: continue on IV lasix. Monitor I/Os.  Acute hypoxic respiratory failure: 88% on RA documented in ED. Weaned off of BiPAP. Continue on supplemental oxygen and wean as tolerated    HTN: continue on metoprolol    COPD: w/o exacerbation. Continue on bronchodilators & encourage incentive spirometry    CKDIV: Cr is labile. Avoid nephrotoxic meds. If Cr continues to rise, will consult nephro    OSA: not on CPAP at home   BPH: continue on home dose of flomax    Depression: severity unknown. Continue on home dose of duloxetine, mirtazapine    Gout: not recently taking allopurinol    ACD: likely secondary to CKD. H&H are labile. Denies any bleeding. Will continue to monitor   Obesity: BMI 35.9. Complicates overall care & prognosis     DVT prophylaxis: eliquis  Code Status: full Family Communication:  Disposition Plan: depends on PT/OT recs   Level of care: Progressive  Status is: Inpatient Remains inpatient appropriate because: requiring IV lasix   Consultants:  Cardio   Procedures:  Antimicrobials:   Subjective: Pt c/o malaise   Objective: Vitals:   10/19/21 0726 10/19/21 0732 10/19/21 1117 10/19/21 1332  BP: 96/76  108/76    Pulse: 92  (!) 108   Resp: 18  18   Temp: 98 F (36.7 C)  98.2 F (36.8 C)   TempSrc:      SpO2: 97% 97% 98% 97%  Weight:      Height:        Intake/Output Summary (Last 24 hours) at 10/19/2021 1517 Last data filed at 10/19/2021 1450 Gross per 24 hour  Intake 812.97 ml  Output 2050 ml  Net -1237.03 ml   Filed Weights   10/17/21 1851 10/18/21 1837 10/19/21 0500  Weight: 129 kg 127.5 kg 127.1 kg    Examination:  General exam: Appears calm and comfortable  Respiratory system: diminished breath sounds b/l  Cardiovascular system: irregularly irregular. No rubs, gallops or clicks.  Gastrointestinal system: Abdomen is obese, soft and nontender. Normal bowel sounds heard. Central nervous system: Alert and oriented. Moves all extremities  Psychiatry: Judgement and insight appear normal. Flat mood and affect      Data Reviewed: I have personally reviewed following labs and imaging studies  CBC: Recent Labs  Lab 10/17/21 1915 10/19/21 0707  WBC 8.0 5.2  NEUTROABS 5.8  --   HGB 10.1* 8.3*  HCT 36.6* 29.8*  MCV 97.1 95.2  PLT 333 580   Basic Metabolic Panel: Recent Labs  Lab 10/17/21 1915 10/18/21 1004 10/19/21 0707  NA 139 138 137  K 4.4 4.2 4.2  CL 106 107 104  CO2 '23 24 25  '$ GLUCOSE 120* 108* 121*  BUN 30* 31* 34*  CREATININE  2.45* 2.48* 2.79*  CALCIUM 9.7 8.9 9.3   GFR: Estimated Creatinine Clearance: 34.4 mL/min (A) (by C-G formula based on SCr of 2.79 mg/dL (H)). Liver Function Tests: Recent Labs  Lab 10/17/21 1915  AST 12*  ALT 8  ALKPHOS 58  BILITOT 0.6  PROT 7.3  ALBUMIN 2.4*   No results for input(s): LIPASE, AMYLASE in the last 168 hours. No results for input(s): AMMONIA in the last 168 hours. Coagulation Profile: No results for input(s): INR, PROTIME in the last 168 hours. Cardiac Enzymes: No results for input(s): CKTOTAL, CKMB, CKMBINDEX, TROPONINI in the last 168 hours. BNP (last 3 results) No results for input(s): PROBNP in the last  8760 hours. HbA1C: No results for input(s): HGBA1C in the last 72 hours. CBG: No results for input(s): GLUCAP in the last 168 hours. Lipid Profile: No results for input(s): CHOL, HDL, LDLCALC, TRIG, CHOLHDL, LDLDIRECT in the last 72 hours. Thyroid Function Tests: No results for input(s): TSH, T4TOTAL, FREET4, T3FREE, THYROIDAB in the last 72 hours. Anemia Panel: No results for input(s): VITAMINB12, FOLATE, FERRITIN, TIBC, IRON, RETICCTPCT in the last 72 hours. Sepsis Labs: Recent Labs  Lab 10/17/21 1915 10/17/21 2116  PROCALCITON 5.36  --   LATICACIDVEN 1.4 0.9    Recent Results (from the past 240 hour(s))  Resp Panel by RT-PCR (Flu A&B, Covid) Nasopharyngeal Swab     Status: None   Collection Time: 10/17/21  7:16 PM   Specimen: Nasopharyngeal Swab; Nasopharyngeal(NP) swabs in vial transport medium  Result Value Ref Range Status   SARS Coronavirus 2 by RT PCR NEGATIVE NEGATIVE Final    Comment: (NOTE) SARS-CoV-2 target nucleic acids are NOT DETECTED.  The SARS-CoV-2 RNA is generally detectable in upper respiratory specimens during the acute phase of infection. The lowest concentration of SARS-CoV-2 viral copies this assay can detect is 138 copies/mL. A negative result does not preclude SARS-Cov-2 infection and should not be used as the sole basis for treatment or other patient management decisions. A negative result may occur with  improper specimen collection/handling, submission of specimen other than nasopharyngeal swab, presence of viral mutation(s) within the areas targeted by this assay, and inadequate number of viral copies(<138 copies/mL). A negative result must be combined with clinical observations, patient history, and epidemiological information. The expected result is Negative.  Fact Sheet for Patients:  EntrepreneurPulse.com.au  Fact Sheet for Healthcare Providers:  IncredibleEmployment.be  This test is no t yet approved  or cleared by the [place] FDA and  has been authorized for detection and/or diagnosis of SARS-CoV-2 by FDA under an Emergency Use Authorization (EUA). This EUA will remain  in effect (meaning this test can be used) for the duration of the COVID-19 declaration under Section 564(b)(1) of the Act, 21 U.S.C.section 360bbb-3(b)(1), unless the authorization is terminated  or revoked sooner.       Influenza A by PCR NEGATIVE NEGATIVE Final   Influenza B by PCR NEGATIVE NEGATIVE Final    Comment: (NOTE) The Xpert Xpress SARS-CoV-2/FLU/RSV plus assay is intended as an aid in the diagnosis of influenza from Nasopharyngeal swab specimens and should not be used as a sole basis for treatment. Nasal washings and aspirates are unacceptable for Xpert Xpress SARS-CoV-2/FLU/RSV testing.  Fact Sheet for Patients: EntrepreneurPulse.com.au  Fact Sheet for Healthcare Providers: IncredibleEmployment.be  This test is not yet approved or cleared by the [place] FDA and has been authorized for detection and/or diagnosis of SARS-CoV-2 by FDA under an Emergency Use Authorization (EUA). This EUA  will remain in effect (meaning this test can be used) for the duration of the COVID-19 declaration under Section 564(b)(1) of the Act, 21 U.S.C. section 360bbb-3(b)(1), unless the authorization is terminated or revoked.  Performed at [facility], [address] [place], [place] [address]   Blood culture (routine x 2)     Status: Abnormal (Preliminary result)   Collection Time: 10/17/21  7:16 PM   Specimen: BLOOD  Result Value Ref Range Status   Specimen Description   Final    BLOOD LEFT WRIST Performed at [facility], [address] [place], [place] [address]    Special Requests   Final    BOTTLES DRAWN AEROBIC AND ANAEROBIC Blood Culture adequate volume Performed at [facility], [address] [place], [place] [address]     Culture  Setup Time   Final    GRAM POSITIVE COCCI IN BOTH AEROBIC AND ANAEROBIC BOTTLES CRITICAL RESULT CALLED TO, READ BACK BY AND VERIFIED WITH: Lonie Peak PHARMD 3570 10/18/21 HNM    Culture (A)  Final    STAPHYLOCOCCUS EPIDERMIDIS THE SIGNIFICANCE OF ISOLATING THIS ORGANISM FROM A SINGLE SET OF BLOOD CULTURES WHEN MULTIPLE SETS ARE DRAWN IS UNCERTAIN. PLEASE NOTIFY THE MICROBIOLOGY DEPARTMENT WITHIN ONE WEEK IF SPECIATION AND SENSITIVITIES ARE REQUIRED. Performed at [facility], [place] [address], [place], [place] [address]    Report Status PENDING  Incomplete  Blood culture (routine x 2)     Status: None (Preliminary result)   Collection Time: 10/17/21  7:16 PM   Specimen: BLOOD  Result Value Ref Range Status   Specimen Description BLOOD RIGHT ARM  Final   Special Requests   Final    BOTTLES DRAWN AEROBIC AND ANAEROBIC Blood Culture results may not be optimal due to an inadequate volume of blood received in culture bottles   Culture   Final    NO GROWTH 2 DAYS Performed at [facility], [address] [place], [place] [address]    Report Status PENDING  Incomplete  Blood Culture ID Panel (Reflexed)     Status: Abnormal   Collection Time: 10/17/21  7:16 PM  Result Value Ref Range Status   Enterococcus faecalis NOT DETECTED NOT DETECTED Final   Enterococcus Faecium NOT DETECTED NOT DETECTED Final   Listeria monocytogenes NOT DETECTED NOT DETECTED Final   Staphylococcus species DETECTED (A) NOT DETECTED Final    Comment: CRITICAL RESULT CALLED TO, READ BACK BY AND VERIFIED WITH: Lonie Peak PHARMD 1422 10/18/21 HNM    Staphylococcus aureus (BCID) NOT DETECTED NOT DETECTED Final   Staphylococcus epidermidis DETECTED (A) NOT DETECTED Final    Comment: CRITICAL RESULT CALLED TO, READ BACK BY AND VERIFIED WITH: Lonie Peak PHARMD 1422 10/18/21 HNM    Staphylococcus lugdunensis NOT DETECTED NOT DETECTED Final   Streptococcus species NOT DETECTED NOT DETECTED Final    Streptococcus agalactiae NOT DETECTED NOT DETECTED Final   Streptococcus pneumoniae NOT DETECTED NOT DETECTED Final   Streptococcus pyogenes NOT DETECTED NOT DETECTED Final   A.calcoaceticus-baumannii NOT DETECTED NOT DETECTED Final   Bacteroides fragilis NOT DETECTED NOT DETECTED Final   Enterobacterales NOT DETECTED NOT DETECTED Final   Enterobacter cloacae complex NOT DETECTED NOT DETECTED Final   Escherichia coli NOT DETECTED NOT DETECTED Final   Klebsiella aerogenes NOT DETECTED NOT DETECTED Final   Klebsiella oxytoca NOT DETECTED NOT DETECTED Final   Klebsiella pneumoniae NOT DETECTED NOT DETECTED Final   Proteus species NOT DETECTED NOT DETECTED Final  Salmonella species NOT DETECTED NOT DETECTED Final   Serratia marcescens NOT DETECTED NOT DETECTED Final   Haemophilus influenzae NOT DETECTED NOT DETECTED Final   Neisseria meningitidis NOT DETECTED NOT DETECTED Final   Pseudomonas aeruginosa NOT DETECTED NOT DETECTED Final   Stenotrophomonas maltophilia NOT DETECTED NOT DETECTED Final   Candida albicans NOT DETECTED NOT DETECTED Final   Candida auris NOT DETECTED NOT DETECTED Final   Candida glabrata NOT DETECTED NOT DETECTED Final   Candida krusei NOT DETECTED NOT DETECTED Final   Candida parapsilosis NOT DETECTED NOT DETECTED Final   Candida tropicalis NOT DETECTED NOT DETECTED Final   Cryptococcus neoformans/gattii NOT DETECTED NOT DETECTED Final   Methicillin resistance mecA/C NOT DETECTED NOT DETECTED Final    Comment: Performed at [facility], [address] [place], [place] [address]  CULTURE, BLOOD (ROUTINE X 2) w Reflex to ID Panel     Status: None (Preliminary result)   Collection Time: 10/18/21  7:05 PM   Specimen: BLOOD  Result Value Ref Range Status   Specimen Description BLOOD [facility]  Final   Special Requests BOTTLES DRAWN AEROBIC AND ANAEROBIC BCAV  Final   Culture   Final    NO GROWTH < 12 HOURS Performed at [facility], [address] [place], [place] [address]    Report Status PENDING  Incomplete  CULTURE, BLOOD (ROUTINE X 2) w Reflex to ID Panel     Status: None (Preliminary result)   Collection Time: 10/18/21  7:05 PM   Specimen: BLOOD  Result Value Ref Range Status   Specimen Description BLOOD LAC  Final   Special Requests BOTTLES DRAWN AEROBIC AND ANAEROBIC BCAV  Final   Culture   Final    NO GROWTH < 12 HOURS Performed at [facility], [address] [place], [place] [address]    Report Status PENDING  Incomplete         Radiology Studies: DG Chest Portable 1 View  Result Date: 10/17/2021 CLINICAL DATA:  Shortness of breath EXAM: PORTABLE CHEST 1 VIEW COMPARISON:  07/23/2021 FINDINGS: The heart size and mediastinal contours are within normal limits. Both lungs are clear. Old right-sided rib fracture. IMPRESSION: No active disease. Electronically Signed   By: Donavan Foil M.D.   On: 10/17/2021 19:39   ECHOCARDIOGRAM COMPLETE  Result Date: 10/18/2021    ECHOCARDIOGRAM REPORT   Patient Name:   Matthew [name] Date of Exam: 10/18/2021 Medical Rec #:  [id number]      Height:       66.0 in Accession #:    [id number]     Weight:       284.4 lb Date of Birth:  1950/08/21       BSA:          2.323 m Patient Age:    30 years       BP:           130/109 mmHg Patient Gender: M              HR:           123 bpm. Exam Location:  [facility] Procedure: 2D Echo, Color Doppler and Cardiac Doppler Indications:     I50.31 congestive heart failure-Acute Diastolic  History:         Patient has prior history of Echocardiogram examinations, most                  recent 06/13/2021. CHF, CAD, CKD and COPD;  Risk                  Factors:Hypertension.  Sonographer:     Charmayne Sheer Referring Phys:  [id number] Athena Masse Diagnosing Phys: Nelva Bush MD  Sonographer Comments: Technically difficult study due to poor echo windows. Image acquisition challenging due to patient body habitus and Image acquisition challenging due to COPD.  IMPRESSIONS  1. Left ventricular ejection fraction, by estimation, is 35 to 40%. The left ventricle has moderately decreased function. Left ventricular endocardial border not optimally defined to evaluate regional wall motion. The left ventricular internal cavity size was mildly to moderately dilated. There is moderate left ventricular hypertrophy. Left ventricular diastolic function could not be evaluated.  2. Right ventricular systolic function is normal. The right ventricular size is mildly enlarged. Mildly increased right ventricular wall thickness.  3. Left atrial size was mildly dilated.  4. Right atrial size was mildly dilated.  5. The mitral valve was not well visualized. Mild mitral valve regurgitation.  6. The aortic valve is tricuspid. Aortic valve regurgitation is not visualized. No aortic stenosis is present.  7. The inferior vena cava is dilated in size with >50% respiratory variability, suggesting right atrial pressure of 8 mmHg. FINDINGS  Left Ventricle: Left ventricular ejection fraction, by estimation, is 35 to 40%. The left ventricle has moderately decreased function. Left ventricular endocardial border not optimally defined to evaluate regional wall motion. Definity contrast agent was given IV to delineate the left ventricular endocardial borders. The left ventricular internal cavity size was mildly to moderately dilated. There is moderate left ventricular hypertrophy. Left ventricular diastolic function could not be evaluated due  to atrial fibrillation. Left ventricular diastolic function could not be evaluated. Right Ventricle: The right ventricular size is mildly enlarged. Mildly increased right ventricular wall thickness. Right ventricular systolic function is normal. Left Atrium: Left atrial size was mildly dilated. Right Atrium: Right atrial size was mildly dilated. Pericardium: There is no evidence of pericardial effusion. Presence of epicardial fat layer. Mitral Valve: The mitral valve  was not well visualized. Mild mitral valve regurgitation. MV peak gradient, 6.0 mmHg. The mean mitral valve gradient is 2.0 mmHg. Tricuspid Valve: The tricuspid valve is not well visualized. Tricuspid valve regurgitation is mild. Aortic Valve: The aortic valve is tricuspid. Aortic valve regurgitation is not visualized. No aortic stenosis is present. Aortic valve mean gradient measures 4.0 mmHg. Aortic valve peak gradient measures 7.7 mmHg. Aortic valve area, by VTI measures 3.56 cm. Pulmonic Valve: The pulmonic valve was not well visualized. Pulmonic valve regurgitation is trivial. No evidence of pulmonic stenosis. Aorta: The aortic root is normal in size and structure. Pulmonary Artery: The pulmonary artery is not well seen. Venous: The inferior vena cava is dilated in size with greater than 50% respiratory variability, suggesting right atrial pressure of 8 mmHg. IAS/Shunts: The interatrial septum was not well visualized.  LEFT VENTRICLE PLAX 2D LVIDd:         6.05 cm   Diastology LVIDs:         4.57 cm   LV e' medial:    8.49 cm/s LV PW:         1.38 cm   LV E/e' medial:  11.6 LV IVS:        1.02 cm   LV e' lateral:   10.00 cm/s LVOT diam:     2.30 cm   LV E/e' lateral: 9.9 LV SV:         69 LV  SV Index:   30 LVOT Area:     4.15 cm  RIGHT VENTRICLE RV Basal diam:  4.75 cm LEFT ATRIUM             Index        RIGHT ATRIUM           Index LA diam:        4.70 cm 2.02 cm/m   RA Area:     22.50 cm LA Vol (A2C):   86.4 ml 37.20 ml/m  RA Volume:   72.30 ml  31.13 ml/m LA Vol (A4C):   71.2 ml 30.66 ml/m LA Biplane Vol: 78.1 ml 33.63 ml/m  AORTIC VALVE                    PULMONIC VALVE AV Area (Vmax):    3.47 cm     PV Vmax:       0.89 m/s AV Area (Vmean):   3.34 cm     PV Vmean:      59.100 cm/s AV Area (VTI):     3.56 cm     PV VTI:        0.120 m AV Vmax:           139.00 cm/s  PV Peak grad:  3.2 mmHg AV Vmean:          93.800 cm/s  PV Mean grad:  2.0 mmHg AV VTI:            0.195 m AV Peak Grad:      7.7  mmHg AV Mean Grad:      4.0 mmHg LVOT Vmax:         116.00 cm/s LVOT Vmean:        75.300 cm/s LVOT VTI:          0.167 m LVOT/AV VTI ratio: 0.86  AORTA Ao Root diam: 3.80 cm MITRAL VALVE MV Area (PHT): 5.43 cm    SHUNTS MV Area VTI:   4.78 cm    Systemic VTI:  0.17 m MV Peak grad:  6.0 mmHg    Systemic Diam: 2.30 cm MV Mean grad:  2.0 mmHg MV Vmax:       1.22 m/s MV Vmean:      67.1 cm/s MV Decel Time: 140 msec MV E velocity: 98.90 cm/s Nelva Bush MD Electronically signed by Nelva Bush MD Signature Date/Time: 10/18/2021/6:33:13 PM    Final         Scheduled Meds:  apixaban  5 mg Oral BID   DULoxetine  60 mg Oral BID   furosemide  40 mg Intravenous BID   levalbuterol  0.63 mg Nebulization TID   melatonin  5 mg Oral QHS   metoprolol tartrate  12.5 mg Oral Q6H   mirtazapine  15 mg Oral QHS   mometasone-formoterol  2 puff Inhalation BID   multivitamin with minerals  1 tablet Oral Daily   tamsulosin  0.4 mg Oral QPC supper   Continuous Infusions:     LOS: 2 days    Time spent: 31 mins     Wyvonnia Dusky, MD [facility] Pager [phone]  If 7PM-7AM, please contact night-coverage 10/19/2021, 3:17 PM

## 2021-10-19 NOTE — Progress Notes (Signed)
Progress Note  Patient Name: Matthew [name] Date of Encounter: 10/19/2021  [facility] Cardiologist: Nelva Bush, MD   Subjective   Shortness of breath still present with minimal exertion, denies chest pain, denies palpitations.  States having asthma and COPD, he is a never smoker.  Also endorses being anxious.  Inpatient Medications    Scheduled Meds:  apixaban  5 mg Oral BID   DULoxetine  60 mg Oral BID   furosemide  40 mg Intravenous BID   levalbuterol  0.63 mg Nebulization TID   melatonin  5 mg Oral QHS   metoprolol tartrate  12.5 mg Oral Q6H   mirtazapine  15 mg Oral QHS   mometasone-formoterol  2 puff Inhalation BID   multivitamin with minerals  1 tablet Oral Daily   tamsulosin  0.4 mg Oral QPC supper   Continuous Infusions:  PRN Meds: acetaminophen, ipratropium-albuterol, ondansetron (ZOFRAN) IV   Vital Signs    Vitals:   10/19/21 0732 10/19/21 1117 10/19/21 1332 10/19/21 1553  BP:  108/76  91/62  Pulse:  (!) 108  74  Resp:  18  18  Temp:  98.2 F (36.8 C)  98.1 F (36.7 C)  TempSrc:      SpO2: 97% 98% 97% 99%  Weight:      Height:        Intake/Output Summary (Last 24 hours) at 10/19/2021 1805 Last data filed at 10/19/2021 1726 Gross per 24 hour  Intake 812.97 ml  Output 2200 ml  Net -1387.03 ml   Last 3 Weights 10/19/2021 10/18/2021 10/17/2021  Weight (lbs) 280 lb 3.3 oz 281 lb 1.4 oz 284 lb 6.3 oz  Weight (kg) 127.1 kg 127.5 kg 129 kg      Telemetry    Atrial fibrillation, heart rate 111- Personally Reviewed  ECG     - Personally Reviewed  Physical Exam   GEN: No acute distress.   Neck: No JVD Cardiac: Irregular irregular Respiratory: Expiratory wheezing GI: Soft, nontender, distended  MS: No edema; No deformity. Neuro:  Nonfocal  Psych: Normal affect   Labs    High Sensitivity Troponin:   Recent Labs  Lab 10/17/21 1915 10/17/21 2116  TROPONINIHS 15 13     Chemistry Recent Labs  Lab 10/17/21 1915 10/18/21 1004  10/19/21 0707  NA 139 138 137  K 4.4 4.2 4.2  CL 106 107 104  CO2 '23 24 25  '$ GLUCOSE 120* 108* 121*  BUN 30* 31* 34*  CREATININE 2.45* 2.48* 2.79*  CALCIUM 9.7 8.9 9.3  PROT 7.3  --   --   ALBUMIN 2.4*  --   --   AST 12*  --   --   ALT 8  --   --   ALKPHOS 58  --   --   BILITOT 0.6  --   --   GFRNONAA 27* 27* 23*  ANIONGAP '10 7 8    '$ Lipids No results for input(s): CHOL, TRIG, HDL, LABVLDL, LDLCALC, CHOLHDL in the last 168 hours.  Hematology Recent Labs  Lab 10/17/21 1915 10/19/21 0707  WBC 8.0 5.2  RBC 3.77* 3.13*  HGB 10.1* 8.3*  HCT 36.6* 29.8*  MCV 97.1 95.2  MCH 26.8 26.5  MCHC 27.6* 27.9*  RDW 14.6 14.3  PLT 333 255   Thyroid No results for input(s): TSH, FREET4 in the last 168 hours.  BNP Recent Labs  Lab 10/17/21 1916  BNP 691.7*    DDimer No results for input(s): DDIMER in  the last 168 hours.   Radiology    DG Chest Portable 1 View  Result Date: 10/17/2021 CLINICAL DATA:  Shortness of breath EXAM: PORTABLE CHEST 1 VIEW COMPARISON:  07/23/2021 FINDINGS: The heart size and mediastinal contours are within normal limits. Both lungs are clear. Old right-sided rib fracture. IMPRESSION: No active disease. Electronically Signed   By: Donavan Foil M.D.   On: 10/17/2021 19:39   ECHOCARDIOGRAM COMPLETE  Result Date: 10/18/2021    ECHOCARDIOGRAM REPORT   Patient Name:   Matthew [name] Date of Exam: 10/18/2021 Medical Rec #:  [id number]      Height:       66.0 in Accession #:    [id number]     Weight:       284.4 lb Date of Birth:  08/15/1950       BSA:          2.323 m Patient Age:    13 years       BP:           130/109 mmHg Patient Gender: M              HR:           123 bpm. Exam Location:  [facility] Procedure: 2D Echo, Color Doppler and Cardiac Doppler Indications:     I50.31 congestive heart failure-Acute Diastolic  History:         Patient has prior history of Echocardiogram examinations, most                  recent 06/13/2021. CHF, CAD, CKD and COPD; Risk                   Factors:Hypertension.  Sonographer:     Charmayne Sheer Referring Phys:  [id number] Athena Masse Diagnosing Phys: Nelva Bush MD  Sonographer Comments: Technically difficult study due to poor echo windows. Image acquisition challenging due to patient body habitus and Image acquisition challenging due to COPD. IMPRESSIONS  1. Left ventricular ejection fraction, by estimation, is 35 to 40%. The left ventricle has moderately decreased function. Left ventricular endocardial border not optimally defined to evaluate regional wall motion. The left ventricular internal cavity size was mildly to moderately dilated. There is moderate left ventricular hypertrophy. Left ventricular diastolic function could not be evaluated.  2. Right ventricular systolic function is normal. The right ventricular size is mildly enlarged. Mildly increased right ventricular wall thickness.  3. Left atrial size was mildly dilated.  4. Right atrial size was mildly dilated.  5. The mitral valve was not well visualized. Mild mitral valve regurgitation.  6. The aortic valve is tricuspid. Aortic valve regurgitation is not visualized. No aortic stenosis is present.  7. The inferior vena cava is dilated in size with >50% respiratory variability, suggesting right atrial pressure of 8 mmHg. FINDINGS  Left Ventricle: Left ventricular ejection fraction, by estimation, is 35 to 40%. The left ventricle has moderately decreased function. Left ventricular endocardial border not optimally defined to evaluate regional wall motion. Definity contrast agent was given IV to delineate the left ventricular endocardial borders. The left ventricular internal cavity size was mildly to moderately dilated. There is moderate left ventricular hypertrophy. Left ventricular diastolic function could not be evaluated due  to atrial fibrillation. Left ventricular diastolic function could not be evaluated. Right Ventricle: The right ventricular size is mildly enlarged. Mildly  increased right ventricular wall thickness. Right ventricular systolic function is normal. Left Atrium: Left atrial size was  mildly dilated. Right Atrium: Right atrial size was mildly dilated. Pericardium: There is no evidence of pericardial effusion. Presence of epicardial fat layer. Mitral Valve: The mitral valve was not well visualized. Mild mitral valve regurgitation. MV peak gradient, 6.0 mmHg. The mean mitral valve gradient is 2.0 mmHg. Tricuspid Valve: The tricuspid valve is not well visualized. Tricuspid valve regurgitation is mild. Aortic Valve: The aortic valve is tricuspid. Aortic valve regurgitation is not visualized. No aortic stenosis is present. Aortic valve mean gradient measures 4.0 mmHg. Aortic valve peak gradient measures 7.7 mmHg. Aortic valve area, by VTI measures 3.56 cm. Pulmonic Valve: The pulmonic valve was not well visualized. Pulmonic valve regurgitation is trivial. No evidence of pulmonic stenosis. Aorta: The aortic root is normal in size and structure. Pulmonary Artery: The pulmonary artery is not well seen. Venous: The inferior vena cava is dilated in size with greater than 50% respiratory variability, suggesting right atrial pressure of 8 mmHg. IAS/Shunts: The interatrial septum was not well visualized.  LEFT VENTRICLE PLAX 2D LVIDd:         6.05 cm   Diastology LVIDs:         4.57 cm   LV e' medial:    8.49 cm/s LV PW:         1.38 cm   LV E/e' medial:  11.6 LV IVS:        1.02 cm   LV e' lateral:   10.00 cm/s LVOT diam:     2.30 cm   LV E/e' lateral: 9.9 LV SV:         69 LV SV Index:   30 LVOT Area:     4.15 cm  RIGHT VENTRICLE RV Basal diam:  4.75 cm LEFT ATRIUM             Index        RIGHT ATRIUM           Index LA diam:        4.70 cm 2.02 cm/m   RA Area:     22.50 cm LA Vol (A2C):   86.4 ml 37.20 ml/m  RA Volume:   72.30 ml  31.13 ml/m LA Vol (A4C):   71.2 ml 30.66 ml/m LA Biplane Vol: 78.1 ml 33.63 ml/m  AORTIC VALVE                    PULMONIC VALVE AV Area (Vmax):     3.47 cm     PV Vmax:       0.89 m/s AV Area (Vmean):   3.34 cm     PV Vmean:      59.100 cm/s AV Area (VTI):     3.56 cm     PV VTI:        0.120 m AV Vmax:           139.00 cm/s  PV Peak grad:  3.2 mmHg AV Vmean:          93.800 cm/s  PV Mean grad:  2.0 mmHg AV VTI:            0.195 m AV Peak Grad:      7.7 mmHg AV Mean Grad:      4.0 mmHg LVOT Vmax:         116.00 cm/s LVOT Vmean:        75.300 cm/s LVOT VTI:          0.167 m LVOT/AV VTI ratio: 0.86  AORTA  Ao Root diam: 3.80 cm MITRAL VALVE MV Area (PHT): 5.43 cm    SHUNTS MV Area VTI:   4.78 cm    Systemic VTI:  0.17 m MV Peak grad:  6.0 mmHg    Systemic Diam: 2.30 cm MV Mean grad:  2.0 mmHg MV Vmax:       1.22 m/s MV Vmean:      67.1 cm/s MV Decel Time: 140 msec MV E velocity: 98.90 cm/s Nelva Bush MD Electronically signed by Nelva Bush MD Signature Date/Time: 10/18/2021/6:33:13 PM    Final     Cardiac Studies   10/18/2020 1. Left ventricular ejection fraction, by estimation, is 35 to 40%. The  left ventricle has moderately decreased function. Left ventricular  endocardial border not optimally defined to evaluate regional wall motion.  The left ventricular internal cavity  size was mildly to moderately dilated. There is moderate left ventricular  hypertrophy. Left ventricular diastolic function could not be evaluated.   2. Right ventricular systolic function is normal. The right ventricular  size is mildly enlarged. Mildly increased right ventricular wall  thickness.   3. Left atrial size was mildly dilated.   4. Right atrial size was mildly dilated.   5. The mitral valve was not well visualized. Mild mitral valve  regurgitation.   6. The aortic valve is tricuspid. Aortic valve regurgitation is not  visualized. No aortic stenosis is present.   7. The inferior vena cava is dilated in size with >50% respiratory  variability, suggesting right atrial pressure of 8 mmHg.   Patient Profile     71 y.o. 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  Assessment & Plan    A-fib RVR -Heart rate improved although still elevated -Acute respiratory dysfunction, albuterol use -Low blood pressures preventing escalation of beta-blocker, CKD preventing use of digoxin, pulmonary dysfunction preventing use of amiodarone -Continue Lopressor 12.5 mg every 6 hours, titrate as BP permits.  Heart rate reasonable. -Continue Eliquis  2.  Cardiomyopathy,  -likely nonischemic/tachycardia induced -Consolidate Lopressor to Toprol-XL when heart rate better -Low blood pressures preventing use of GDMT -We will reduce Lasix to 40 mg daily.  Hopefully BP improves so Lopressor can be titrated  3.  Asthma, respiratory failure, expiratory wheezing on exam -Breathing treatment, management as per primary team  Total encounter time more than 50 minutes  Greater than 50% was spent in counseling and coordination of care with the patient       Signed, Kate Sable, MD  10/19/2021, 6:05 PM

## 2021-10-19 NOTE — Progress Notes (Signed)
Patient seen for svn. Patient was awake and stated he has had difficulty sleeping due to cpap. Patient states he does not use respiratory devices at home and now that breathing is mostly back to normal it is hard to tolerate devices and difficult to sleep. Now c/o nasal pain from mask. Patient has been on device for appx 4 hours so took off and placed back on nasal cannula to give him a break and relief of nasal soreness in effort to reduce any risk of skin breakdown. Will continue to monitor. Cpap on sb ?

## 2021-10-19 NOTE — Consult Note (Signed)
° °  Heart Failure Nurse Navigator Note  HfrEF 35-40%.  Left ventricular internal cavity mildly to moderately dilated.  Moderate left ventricular hypertrophy.  Mild mitral regurgitation.  In October 2022 ejection fraction reported at 50 to 55%.  He presented to the emergency room by way of EMS with respiratory distress on CPAP.  He stated that his shortness of breath had significantly worsened over the last 24 hours.  He had also noted some orthopnea.  BNP was elevated at 691. He was noted to be in atrial fibrillation with a rate of 165 bpm.  Comorbidities:  Anemia Anxiety/depression Atrial fibrillation Asthma Coronary artery disease Hypertension Osteoporosis Sleep apnea Morbid obesity Chronic kidney disease stage IV  Medications:  Eliquis 5 mg 2 times a day Furosemide 40 mg IV 2 times a day Metoprolol tartrate 12.5 mg every 6 hours  Labs:  Sodium 137, potassium 4.2, chloride 104, CO2 25, BUN 34, creatinine 2.79 up from 2.48 of yesterday. Weight is 127.1 kg Blood pressure is 108/76 Intake is 118 mL Output 1000 mL   Initial meeting with patient today he was lying quietly in bed.  He states that he is living at home with his ex-wife.  Discussed heart failure, he states that he is not been told in the past that he has heart failure, so I have seen him on other admissions and he is followed with Darylene Price in the outpatient heart failure clinic.  Explained that his heart pumping function in the past had been low normal and now by echocardiogram performed on this admission it is now 35 to 40%.  Along with moderate LVH.  He states up until 1 month ago he had been weighing himself daily and because he did not see a fluctuation in his weight that he stopped weighing himself.  Explained the importance of daily weights and what to report.  He states that he no longer add salt at the table but admits that he does not eat a completely low-sodium diet.  Pricilla Riffle RN [facility]  Also  talked about the importance of no more than 64 ounces in a 24-hour period.  Explained to him about a new program that is available through his insurance for daily monitoring of weight, blood pressure and heart rhythm for 4 months but they do not have Internet nor her computer nor smart phones so at this time he would not be able to participate.  He has follow-up in the outpatient heart failure clinic on March 20 at 3 PM.

## 2021-10-19 NOTE — Evaluation (Signed)
Physical Therapy Evaluation ?Patient Details ?Name: Matthew [name] ?MRN: [id number] ?DOB: [birth year] ?Today's Date: 10/19/2021 ? ?History of Present Illness ? Per MD Notes: Matthew [name] is a 71 y.o. male with medical history significant for COPD, OSA not on CPAP, prior home O2 use, class IV obesity, CKD stage IV, HTN, A-fib on Eliquis, diastolic CHF with last exacerbation in October 2022, who presents to the ED by EMS with respiratory distress, chest pain, arriving on CPAP.  ?Clinical Impression ? Pt is a pleasant 71 year old male who was admitted for Afib. Pt performs bed mobility with min assist, transfers with max assist +2, and ambulation with mod assist +2 and RW. Pt demonstrates deficits with strength/endurance/mobility. All mobility performed on 3.5L to 4L with sats WNL. Pt is not currently at baseline level and is very high falls risk. Would benefit from skilled PT to address above deficits and promote optimal return to PLOF; recommend transition to STR upon discharge from acute hospitalization. ? ?   ? ?Recommendations for follow up therapy are one component of a multi-disciplinary discharge planning process, led by the attending physician.  Recommendations may be updated based on patient status, additional functional criteria and insurance authorization. ? ?Follow Up Recommendations Skilled nursing-short term rehab (<3 hours/day) ? ?  ?Assistance Recommended at Discharge Intermittent Supervision/Assistance  ?Patient can return home with the following ? Two people to help with walking and/or transfers;Assistance with cooking/housework;Help with stairs or ramp for entrance ? ?  ?Equipment Recommendations BSC/3in1  ?Recommendations for Other Services ?    ?  ?Functional Status Assessment Patient has had a recent decline in their functional status and demonstrates the ability to make significant improvements in function in a reasonable and predictable amount of time.  ? ?  ?Precautions / Restrictions  Precautions ?Precautions: Fall ?Restrictions ?Weight Bearing Restrictions: No  ? ?  ? ?Mobility ? Bed Mobility ?Overal bed mobility: Needs Assistance ?Bed Mobility: Supine to Sit, Sit to Supine ?  ?  ?Supine to sit: Supervision, HOB elevated ?Sit to supine: Min assist ?  ?General bed mobility comments: needs assist for B LE management back into bed ?  ? ?Transfers ?Overall transfer level: Needs assistance ?Equipment used: Rolling walker (2 wheels) ?Transfers: Sit to/from Stand ?Sit to Stand: From elevated surface, +2 physical assistance, +2 safety/equipment, Max assist ?  ?  ?  ?  ?  ?General transfer comment: needs assist for upright posture. Heavy exertion noted with mobility efforts. All mobility performed on 3L of O2. with sats <90%. ?  ? ?Ambulation/Gait ?Ambulation/Gait assistance: Mod assist, +2 physical assistance ?Gait Distance (Feet): 3 Feet ?Assistive device: Rolling walker (2 wheels) ?Gait Pattern/deviations: Step-to pattern ?  ?  ?  ?General Gait Details: safe technique with step to gait pattern up towards HOB. Unsteady and pt very fearful of falls.Very effortful with mobility. Reports 8/10 on RPE scale ? ?Stairs ?  ?  ?  ?  ?  ? ?Wheelchair Mobility ?  ? ?Modified Rankin (Stroke Patients Only) ?  ? ?  ? ?Balance Overall balance assessment: Needs assistance ?Sitting-balance support: Feet supported, No upper extremity supported ?Sitting balance-Leahy Scale: Good ?  ?  ?Standing balance support: Reliant on assistive device for balance, During functional activity, Bilateral upper extremity supported ?Standing balance-Leahy Scale: Poor ?  ?  ?  ?  ?  ?  ?  ?  ?  ?  ?  ?  ?   ? ? ? ?Pertinent Vitals/Pain Pain Assessment ?Pain  Assessment: 0-10 ?Pain Score: 7  ?Pain Location: L abdominal pain with activity; L knee. ?Pain Descriptors / Indicators: Aching, Sore, Grimacing ?Pain Intervention(s): Limited activity within patient's tolerance, Monitored during session  ? ? ?Home Living Family/patient expects to be  discharged to:: Private residence ?Living Arrangements: Spouse/significant other ?Available Help at Discharge: Family ?Type of Home: Mobile home ?Home Access: Stairs to enter ?Entrance Stairs-Rails: Right;Left;Can reach both ?Entrance Stairs-Number of Steps: 4 ?  ?Home Layout: One level ?Home Equipment: Conservation officer, nature (2 wheels);Cane - single point ?Additional Comments: Pt states his TTB and BSC were recently broken and are not functional.  ?  ?Prior Function Prior Level of Function : Independent/Modified Independent ?  ?  ?  ?  ?  ?  ?Mobility Comments: Pt states he has not been able to get out of the house in last ~ month. He uses RW for squat pivot transfers to [facility] from recliner at home. ?ADLs Comments: Reports IND with ADLs; wife handles cooking, cleaning, driving ?  ? ? ?Hand Dominance  ? Dominant Hand: Right ? ?  ?Extremity/Trunk Assessment  ? Upper Extremity Assessment ?Upper Extremity Assessment: Overall WFL for tasks assessed ?  ? ?Lower Extremity Assessment ?Lower Extremity Assessment: Generalized weakness (B LE grossly 4/5; L LE appears painful) ?  ? ?   ?Communication  ? Communication: No difficulties  ?Cognition Arousal/Alertness: Awake/alert ?Behavior During Therapy: [facility] for tasks assessed/performed ?Overall Cognitive Status: Within Functional Limits for tasks assessed ?  ?  ?  ?  ?  ?  ?  ?  ?  ?  ?  ?  ?  ?  ?  ?  ?General Comments: Pt A&Ox4, pleasant, conversational, eager to participate in session. Expresses increased anxiety over financial situation t/o session. ?  ?  ? ?  ?General Comments General comments (skin integrity, edema, etc.): Vitals monitored t/o session. Pt remains on 3.5L with spO2 WNL (95-98%) and HR WFL (Mid 30's-40s at rest, reaches low 90's with physical activity this date). ? ?  ?Exercises Other Exercises ?Other Exercises: Seated EOB educated on PLB with exertion, educated on benefits of seated at EOB and bed mobility to improve lung function.  ? ?Assessment/Plan  ?  ?PT  Assessment Patient needs continued PT services  ?PT Problem List Decreased strength;Decreased activity tolerance;Decreased balance;Decreased mobility;Cardiopulmonary status limiting activity ? ?   ?  ?PT Treatment Interventions Gait training;DME instruction;Therapeutic exercise;Balance training   ? ?PT Goals (Current goals can be found in the Care Plan section)  ?Acute Rehab PT Goals ?Patient Stated Goal: to go home ?PT Goal Formulation: With patient ?Time For Goal Achievement: 11/02/21 ?Potential to Achieve Goals: Good ? ?  ?Frequency Min 2X/week ?  ? ? ?Co-evaluation PT/OT/SLP Co-Evaluation/Treatment: Yes ?Reason for Co-Treatment: To address functional/ADL transfers;For patient/therapist safety ?PT goals addressed during session: Mobility/safety with mobility;Balance ?OT goals addressed during session: ADL's and self-care;Proper use of Adaptive equipment and DME ?  ? ? ?  ?AM-PAC PT "6 Clicks" Mobility  ?Outcome Measure Help needed turning from your back to your side while in a flat bed without using bedrails?: A Little ?Help needed moving from lying on your back to sitting on the side of a flat bed without using bedrails?: A Little ?Help needed moving to and from a bed to a chair (including a wheelchair)?: A Lot ?Help needed standing up from a chair using your arms (e.g., wheelchair or bedside chair)?: Total ?Help needed to walk in hospital room?: Total ?Help needed climbing 3-5  steps with a railing? : Total ?6 Click Score: 11 ? ?  ?End of Session Equipment Utilized During Treatment: Gait belt;Oxygen ?Activity Tolerance: Patient tolerated treatment well ?Patient left: in bed;with bed alarm set ?Nurse Communication: Mobility status ?PT Visit Diagnosis: Difficulty in walking, not elsewhere classified (R26.2);Muscle weakness (generalized) (M62.81);Unsteadiness on feet (R26.81) ?  ? ?Time: 6962-9528 ?PT Time Calculation (min) (ACUTE ONLY): 32 min ? ? ?Charges:   PT Evaluation ?$PT Eval Low Complexity: 1 Low ?PT  Treatments ?$Therapeutic Activity: 8-22 mins ?  ?   ? ? ?Greggory Stallion, PT, DPT, GCS ?[phone] ? ? ?Enijah Furr ?10/19/2021, 5:04 PM ? ?

## 2021-10-19 NOTE — Progress Notes (Signed)
Nutrition Brief Note ? ?RD consulted for assessment of nutritional requirements/ status.  ? ?Wt Readings from Last 15 Encounters:  ?10/19/21 127.1 kg  ?09/14/21 129.3 kg  ?07/23/21 127 kg  ?07/18/21 (!) 138 kg  ?07/06/21 133.9 kg  ?06/23/21 132.4 kg  ?12/16/20 129.3 kg  ?12/03/20 129 kg  ?11/05/20 129.3 kg  ?08/13/20 129.3 kg  ?05/19/20 133.8 kg  ?05/17/20 133.8 kg  ?10/17/18 129.9 kg  ?12/31/17 117.9 kg  ? ?Matthew [name] is a 71 y.o. male with medical history significant for COPD, OSA not on CPAP, prior home O2 use, class IV obesity, CKD stage IV, HTN, A-fib on Eliquis, diastolic CHF with last exacerbation in October 2022, who presents to the ED by EMS with respiratory distress, arriving on CPAP.  Patient states he was in his usual state of health until the day prior when he started having shortness of breath which significantly worsened over the next 24 hours.  He has associated nonradiating retrosternal tightness, worse with breathing.  Denies lower extremity edema.  Has some orthopnea.  Has a nonproductive cough but no fever or chills.  No affected contacts ? ?Pt admitted with rapid a-fib.  ? ?Reviewed I/O's: -882 ml x 24 hours and -844 ml since admission ? ?UOP: 1 L x 24 hours ? ?Pt sleeping soundly at time of visit. He did not arouse to name being called.  ? ?Pt familiar to this RD from prior admissions. Pt typically has a good appetite. Wt has been stable over the past 3 months.  ? ?RD liberalize diet to 2 gram sodium for wider variety of meals selections and add double protein portions with meals. ? ?Nutrition-Focused physical exam completed. Findings are no fat depletion, no muscle depletion, and mild edema.   ? ?Labs reviewed.  ? ?Current diet order is Heart Healthy, patient is consuming approximately 80-100% of meals at this time. Labs and medications reviewed.  ? ?No nutrition interventions warranted at this time. If nutrition issues arise, please consult RD.  ? ?Matthew [name], RD, LDN, CDCES ?Registered  Dietitian II ?Certified Diabetes Care and Education Specialist ?Please refer to [facility] for RD and/or RD on-call/weekend/after hours pager   ?

## 2021-10-19 NOTE — Progress Notes (Signed)
Patient's SBP < 90, cardizem drip was stopped per order parameters. [place] DO notified. BP was 103/74 after 15 minutes post d/cing the cardizem drip.  ?

## 2021-10-19 NOTE — Evaluation (Signed)
Occupational Therapy Evaluation Patient Details Name: Matthew [name] MRN: [id number] DOB: 1950-09-27 Today's Date: 10/19/2021   History of Present Illness Per MD Notes: Matthew [name] is a 71 y.o. male with medical history significant for COPD, OSA not on CPAP, prior home O2 use, class IV obesity, CKD stage IV, HTN, A-fib on Eliquis, diastolic CHF with last exacerbation in October 2022, who presents to the ED by EMS with respiratory distress, chest pain, arriving on CPAP.   Clinical Impression   Mr. Millett was seen for OT/PT co-evaluation this date. Prior to hospital admission, pt was generally modified independent with BADL management at home. He endorses increased fatigue/SOB over the last 1 month with significant increase over the last week. Pt lives with his spouse in a 1-level mobile home with 4 STE. Currently pt demonstrates impairments as described below (See OT problem list) which functionally limit his ability to perform ADL/self-care tasks. Pt currently requires MAX A +2 for functional mobility, MAX A for exertional ADL management including LB dressing and bathing from STS. SET-UP/SUPERVISION for UB ADL management.  Pt would benefit from skilled OT services to address noted impairments and functional limitations (see below for any additional details) in order to maximize safety and independence while minimizing falls risk and caregiver burden. Upon hospital discharge, recommend STR to maximize pt safety and return to PLOF.        Recommendations for follow up therapy are one component of a multi-disciplinary discharge planning process, led by the attending physician.  Recommendations may be updated based on patient status, additional functional criteria and insurance authorization.   Follow Up Recommendations  Skilled nursing-short term rehab (<3 hours/day)    Assistance Recommended at Discharge Intermittent Supervision/Assistance  Patient can return home with the following A lot of help  with bathing/dressing/bathroom;Two people to help with walking and/or transfers;Assistance with cooking/housework;Help with stairs or ramp for entrance;Assist for transportation    Functional Status Assessment  Patient has had a recent decline in their functional status and demonstrates the ability to make significant improvements in function in a reasonable and predictable amount of time.  Equipment Recommendations  BSC/3in1;Tub/shower bench (Bari BSC; Tub Producer, television/film/video)    Recommendations for Other Services       Precautions / Restrictions Precautions Precautions: Fall Restrictions Weight Bearing Restrictions: No      Mobility Bed Mobility Overal bed mobility: Needs Assistance Bed Mobility: Supine to Sit, Sit to Supine     Supine to sit: Supervision, HOB elevated Sit to supine: Min assist   General bed mobility comments: MIN A for BLE mgt for sit>sup.    Transfers Overall transfer level: Needs assistance Equipment used: Rolling walker (2 wheels) Transfers: Sit to/from Stand Sit to Stand: From elevated surface, +2 physical assistance, +2 safety/equipment, Max assist                  Balance Overall balance assessment: Needs assistance Sitting-balance support: Feet supported, No upper extremity supported Sitting balance-Leahy Scale: Good Sitting balance - Comments: steady with static and dynamic sitting balance at EOB.   Standing balance support: Reliant on assistive device for balance, During functional activity, Bilateral upper extremity supported Standing balance-Leahy Scale: Poor Standing balance comment: Unable to maintain static standing balance without physical assist.                           ADL either performed or assessed with clinical judgement   ADL Overall ADL's :  Needs assistance/impaired                                       General ADL Comments: Pt functionally limited by generalized weakness, decreased activity  tolerance and cardiopulmonary status. He is able to perform bed mobility with MIN A, dons bilat LB socks with increased time and effort to perform but no physical assist from therapist. He is more significantly limited when attempting standing ADL management and requires MAX A +2 for STS attempts and side-stepping at EOB.     Vision Patient Visual Report: No change from baseline       Perception     Praxis      Pertinent Vitals/Pain Pain Assessment Pain Assessment: 0-10 Pain Score: 7  Pain Location: L abdominal pain with activity; L knee. Pain Descriptors / Indicators: Aching, Sore, Grimacing Pain Intervention(s): Limited activity within patient's tolerance, Monitored during session, Repositioned, Utilized relaxation techniques     Hand Dominance Right   Extremity/Trunk Assessment Upper Extremity Assessment Upper Extremity Assessment: Overall WFL for tasks assessed   Lower Extremity Assessment Lower Extremity Assessment: Generalized weakness;Defer to PT evaluation       Communication Communication Communication: No difficulties   Cognition Arousal/Alertness: Awake/alert Behavior During Therapy: WFL for tasks assessed/performed Overall Cognitive Status: Within Functional Limits for tasks assessed                                 General Comments: Pt A&Ox4, pleasant, conversational, eager to participate in session. Expresses increased anxiety over financial situation t/o session.     General Comments  Vitals monitored t/o session. Pt remains on 3.5L with spO2 WNL (95-98%) and HR WFL (Mid 30's-40s at rest, reaches low 90's with physical activity this date).    Exercises Other Exercises Other Exercises: Pt edcuated on role of OT in acute setting, safe use of AE/DME for ADL management, falls prevention strategies, energy conservation strategies, and routines modifications to support safety and functional independence during ADL management.   Shoulder  Instructions      Home Living Family/patient expects to be discharged to:: Private residence Living Arrangements: Spouse/significant other Available Help at Discharge: Family Type of Home: Mobile home Home Access: Stairs to enter Entrance Stairs-Number of Steps: 4 Entrance Stairs-Rails: Right;Left;Can reach both Home Layout: One level     Bathroom Shower/Tub: Walk-in shower;Tub/shower unit   Bathroom Toilet: Standard Bathroom Accessibility: No   Home Equipment: Conservation officer, nature (2 wheels);Cane - single point   Additional Comments: Pt states his TTB and BSC were recently broken and are not functional.      Prior Functioning/Environment Prior Level of Function : Independent/Modified Independent             Mobility Comments: Pt states he has not been able to get out of the house in last ~ month. He uses RW for squat pivot transfers to [facility] from recliner at home. ADLs Comments: Reports IND with ADLs; wife handles cooking, cleaning, driving        OT Problem List: Decreased strength;Cardiopulmonary status limiting activity;Decreased activity tolerance;Decreased safety awareness;Impaired balance (sitting and/or standing);Decreased knowledge of use of DME or AE;Decreased range of motion      OT Treatment/Interventions: Therapeutic exercise;Self-care/ADL training;Therapeutic activities;DME and/or AE instruction;Patient/family education;Balance training;Energy conservation    OT Goals(Current goals can be found in the care plan section)  Acute Rehab OT Goals Patient Stated Goal: to be able to get on my riding lawn mower again OT Goal Formulation: With patient Time For Goal Achievement: 11/02/21 Potential to Achieve Goals: Fair  OT Frequency: Min 2X/week    Co-evaluation PT/OT/SLP Co-Evaluation/Treatment: Yes Reason for Co-Treatment: Complexity of the patient's impairments (multi-system involvement);For patient/therapist safety;To address functional/ADL transfers PT goals  addressed during session: Mobility/safety with mobility;Balance;Proper use of DME OT goals addressed during session: ADL's and self-care;Proper use of Adaptive equipment and DME      AM-PAC OT "6 Clicks" Daily Activity     Outcome Measure Help from another person eating meals?: None Help from another person taking care of personal grooming?: None Help from another person toileting, which includes using toliet, bedpan, or urinal?: A Lot Help from another person bathing (including washing, rinsing, drying)?: A Lot Help from another person to put on and taking off regular upper body clothing?: A Little Help from another person to put on and taking off regular lower body clothing?: A Lot 6 Click Score: 17   End of Session Equipment Utilized During Treatment: Gait belt;Rolling walker (2 wheels) Nurse Communication: Mobility status  Activity Tolerance: Patient tolerated treatment well Patient left: in bed;with call bell/phone within reach;with bed alarm set  OT Visit Diagnosis: Other abnormalities of gait and mobility (R26.89)                Time: 6045-4098 OT Time Calculation (min): 34 min Charges:  OT General Charges $OT Visit: 1 Visit OT Evaluation $OT Eval Moderate Complexity: 1 Mod OT Treatments $Self Care/Home Management : 8-22 mins  Shara Blazing, M.S., OTR/L Feeding Team - [facility] Ascom: [phone] 10/19/21, 4:16 PM

## 2021-10-20 DIAGNOSIS — I5023 Acute on chronic systolic (congestive) heart failure: Secondary | ICD-10-CM | POA: Diagnosis not present

## 2021-10-20 DIAGNOSIS — J9601 Acute respiratory failure with hypoxia: Secondary | ICD-10-CM | POA: Diagnosis not present

## 2021-10-20 DIAGNOSIS — I4891 Unspecified atrial fibrillation: Secondary | ICD-10-CM | POA: Diagnosis not present

## 2021-10-20 DIAGNOSIS — J432 Centrilobular emphysema: Secondary | ICD-10-CM

## 2021-10-20 DIAGNOSIS — J9621 Acute and chronic respiratory failure with hypoxia: Secondary | ICD-10-CM | POA: Diagnosis not present

## 2021-10-20 DIAGNOSIS — I10 Essential (primary) hypertension: Secondary | ICD-10-CM

## 2021-10-20 DIAGNOSIS — N184 Chronic kidney disease, stage 4 (severe): Secondary | ICD-10-CM | POA: Diagnosis not present

## 2021-10-20 LAB — BASIC METABOLIC PANEL
Anion gap: 11 (ref 5–15)
BUN: 40 mg/dL — ABNORMAL HIGH (ref 8–23)
CO2: 25 mmol/L (ref 22–32)
Calcium: 9.4 mg/dL (ref 8.9–10.3)
Chloride: 100 mmol/L (ref 98–111)
Creatinine, Ser: 2.96 mg/dL — ABNORMAL HIGH (ref 0.61–1.24)
GFR, Estimated: 22 mL/min — ABNORMAL LOW (ref >60)
Glucose, Bld: 112 mg/dL — ABNORMAL HIGH (ref 70–99)
Potassium: 3.9 mmol/L (ref 3.5–5.1)
Sodium: 136 mmol/L (ref 135–145)

## 2021-10-20 LAB — CBC
HCT: 27.9 % — ABNORMAL LOW (ref 39.0–52.0)
Hemoglobin: 8.1 g/dL — ABNORMAL LOW (ref 13.0–17.0)
MCH: 27.1 pg (ref 26.0–34.0)
MCHC: 29 g/dL — ABNORMAL LOW (ref 30.0–36.0)
MCV: 93.3 fL (ref 80.0–100.0)
Platelets: 249 10*3/uL (ref 150–400)
RBC: 2.99 MIL/uL — ABNORMAL LOW (ref 4.22–5.81)
RDW: 14 % (ref 11.5–15.5)
WBC: 4.6 10*3/uL (ref 4.0–10.5)
nRBC: 0 % (ref 0.0–0.2)

## 2021-10-20 MED ORDER — FUROSEMIDE 20 MG PO TABS
20.0000 mg | ORAL_TABLET | Freq: Every day | ORAL | Status: DC
  Administered 2021-10-21 – 2021-10-29 (×9): 20 mg via ORAL
  Filled 2021-10-20 (×10): qty 1

## 2021-10-20 MED ORDER — METOPROLOL TARTRATE 25 MG PO TABS
25.0000 mg | ORAL_TABLET | Freq: Two times a day (BID) | ORAL | Status: DC
  Administered 2021-10-20 – 2021-10-21 (×3): 25 mg via ORAL
  Filled 2021-10-20 (×3): qty 1

## 2021-10-20 NOTE — Progress Notes (Signed)
PT Cancellation Note ? ?Patient Details ?Name: Matthew [name] ?MRN: [id number] ?DOB: 04/20/1951 ? ? ?Cancelled Treatment:    Reason Eval/Treat Not Completed: Other (comment).  Chart reviewed.  Pt resting in bed upon PT arrival and reports not sleeping well last night and was too tired to participate in therapy at this time (pt declined PT session).  Will re-attempt PT treatment session at a later date/time. ? ?Leitha Bleak, PT ?10/20/21, 11:42 AM ? ?

## 2021-10-20 NOTE — NC FL2 (Signed)
?[place] MEDICAID FL2 LEVEL OF CARE SCREENING TOOL  ?  ? ?IDENTIFICATION  ?Patient Name: ?Matthew [name] Birthdate: 05/31/1951 Sex: male Admission Date (Current Location): ?10/17/2021  ?[place] and [place] Number: ? [place] ?  Facility and Address:  ?[facility], [address], [place], [place] [address] ?     Provider Number: ?[id number]  ?Attending Physician Name and Address:  ?Wyvonnia Dusky, MD ? Relative Name and Phone Number:  ?Maryann Alar (spouse) [phone] ?   ?Current Level of Care: ?Hospital Recommended Level of Care: ?[place] Prior Approval Number: ?  ? ?Date Approved/Denied: ?  PASRR Number: ?[id number] A ? ?Discharge Plan: ?SNF ?  ? ?Current Diagnoses: ?Patient Active Problem List  ? Diagnosis Date Noted  ? NICM (nonischemic cardiomyopathy) ([place])   ? Rapid atrial fibrillation ([place]) 10/17/2021  ? Obesity, Class III, BMI 40-49.9 (morbid obesity) ([place]) 10/17/2021  ? OSA (obstructive sleep apnea) 10/17/2021  ? Anemia due to vitamin B12 deficiency   ? Delirium   ? Hyperkalemia   ? Heart failure with preserved ejection fraction ([place])   ? CKD (chronic kidney disease) stage 4, GFR 15-29 ml/min (HCC)   ? Vitamin B12 deficiency   ? Depression   ? Atrial fibrillation with rapid ventricular response ([place]) 07/11/2021  ? Anemia of chronic disease 07/11/2021  ? Asthma exacerbation 07/03/2021  ? Chest pain 07/03/2021  ? Acute gout   ? Acute on chronic diastolic CHF (congestive heart failure) ([place])   ? Right leg pain   ? Weakness   ? SOB (shortness of breath) 06/12/2021  ? Bilateral leg edema 06/12/2021  ? Atrial fibrillation with RVR ([place]) 06/12/2021  ? AKI (acute kidney injury) ([place]) 06/12/2021  ? Elevated glucose level 06/12/2021  ? Obesity (BMI 30-39.9) 06/12/2021  ? Macrocytosis 06/12/2021  ? Acute on chronic respiratory failure with hypoxia ([place]) 06/12/2021  ? Chronic pain of left knee 12/16/2020  ? COPD (chronic obstructive pulmonary disease) ([place]) 12/04/2020  ?  Acute right hip pain 12/04/2020  ? H/O bilateral hip replacements 12/04/2020  ? Stage 3b chronic kidney disease ([place]) 12/04/2020  ? Displaced comminuted fracture of shaft of left femur, initial encounter for closed fracture ([place]) 05/19/2020  ? Asthma 05/18/2020  ? Hypertension 05/18/2020  ? Femur fracture, left ([place]) 05/18/2020  ? Closed comminuted intra-articular fracture of distal femur ([place]) 05/17/2020  ? Moderate persistent asthma with exacerbation 10/15/2018  ? ? ?Orientation RESPIRATION BLADDER Height & Weight   ?  ?Self, Time, Situation, Place ? O2 (3.5 L nasal cannula) Continent Weight: 253 lb 15.5 oz (115.2 kg) ?Height:  '6\' 2"'$  (188 cm)  ?BEHAVIORAL SYMPTOMS/MOOD NEUROLOGICAL BOWEL NUTRITION STATUS  ?    Continent Diet (see discharge summary)  ?AMBULATORY STATUS COMMUNICATION OF NEEDS Skin   ?Limited Assist Verbally Other (Comment), Skin abrasions (abrasion right nose) ?  ?  ?  ?    ?     ?     ? ? ?Personal Care Assistance Level of Assistance  ?Bathing, Dressing, Total care, Feeding Bathing Assistance: Independent ?Feeding assistance: Independent ?Dressing Assistance: Limited assistance ?Total Care Assistance: Limited assistance  ? ?Functional Limitations Info  ?Sight, Hearing, Speech Sight Info: Adequate ?Hearing Info: Adequate ?Speech Info: Adequate  ? ? ?SPECIAL CARE FACTORS FREQUENCY  ?PT (By licensed PT), OT (By licensed OT)   ?  ?PT Frequency: min 4x weekly ?OT Frequency: min 4x weekly ?  ?  ?  ?   ? ? ?Contractures Contractures Info: Not present  ? ? ?  Additional Factors Info  ?Allergies, Code Status Code Status Info: full ?Allergies Info: demerol (meperidine Hcl), lisinopril, meperidine and related ?  ?  ?  ?   ? ?Current Medications (10/20/2021):  This is the current hospital active medication list ?Current Facility-Administered Medications  ?Medication Dose Route Frequency Provider Last Rate Last Admin  ? acetaminophen (TYLENOL) tablet 650 mg  650 mg Oral Q4H PRN Athena Masse, MD   650 mg at  10/18/21 1905  ? apixaban (ELIQUIS) tablet 5 mg  5 mg Oral BID Athena Masse, MD   5 mg at 10/20/21 0920  ? DULoxetine (CYMBALTA) DR capsule 60 mg  60 mg Oral BID Gwynne Edinger, MD   60 mg at 10/20/21 0920  ? furosemide (LASIX) injection 40 mg  40 mg Intravenous Daily Kate Sable, MD   40 mg at 10/20/21 0920  ? ipratropium-albuterol (DUONEB) 0.5-2.5 (3) MG/3ML nebulizer solution 3 mL  3 mL Nebulization Q6H PRN Wouk, Ailene Rud, MD      ? levalbuterol Penne Lash) nebulizer solution 0.63 mg  0.63 mg Nebulization TID Wyvonnia Dusky, MD   0.63 mg at 10/20/21 0756  ? melatonin tablet 5 mg  5 mg Oral QHS Zierle-Ghosh, Asia B, DO   5 mg at 10/19/21 2109  ? metoprolol tartrate (LOPRESSOR) tablet 12.5 mg  12.5 mg Oral Q6H End, Christopher, MD   12.5 mg at 10/20/21 0616  ? mirtazapine (REMERON) tablet 15 mg  15 mg Oral QHS Gwynne Edinger, MD   15 mg at 10/19/21 2109  ? mometasone-formoterol (DULERA) 200-5 MCG/ACT inhaler 2 puff  2 puff Inhalation BID Gwynne Edinger, MD   2 puff at 10/20/21 0920  ? multivitamin with minerals tablet 1 tablet  1 tablet Oral Daily Wyvonnia Dusky, MD   1 tablet at 10/20/21 0920  ? ondansetron (ZOFRAN) injection 4 mg  4 mg Intravenous Q6H PRN Athena Masse, MD   4 mg at 10/19/21 2111  ? tamsulosin (FLOMAX) capsule 0.4 mg  0.4 mg Oral QPC supper Gwynne Edinger, MD   0.4 mg at 10/19/21 1720  ? ? ? ?Discharge Medications: ?Please see discharge summary for a list of discharge medications. ? ?Relevant Imaging Results: ? ?Relevant Lab Results: ? ? ?Additional Information ?ZOX:[id number] ? ?Alberteen Sam, LCSW ? ? ? ? ?

## 2021-10-20 NOTE — TOC Progression Note (Addendum)
Transition of Care (TOC) - Progression Note  ? ? ?Patient Details  ?Name: Matthew [name] ?MRN: [id number] ?Date of Birth: 09-29-1950 ? ?Transition of Care (TOC) CM/SW Contact  ?Alberteen Sam, LCSW ?Phone Number: ?10/20/2021, 10:19 AM ? ?Clinical Narrative:    ? ?Update 3:26 pm: Patient's spouse called CSW reports they are in agreement with SNF with preference for [facility]. CSW has reached out to [facility] for bed offer however they report patient has 6k bad debt. CSW exploring other bed offers at this time.  ? ? ?CSW spoke with patient regarding SNF rec, he reports that he wishes to discuss this with his wife first. He reports having difficulty reaching her. CSW called wife as well lvm.  ? ?Notes hx of patient at [facility] and hx of HH through [facility]. Pending dispo choice at this time.  ? ?Expected Discharge Plan: Home/Self Care ?Barriers to Discharge: Continued Medical Work up ? ?Expected Discharge Plan and Services ?Expected Discharge Plan: Home/Self Care ?  ?Discharge Planning Services: CM Consult ?  ?Living arrangements for the past 2 months: [place] ?                ?DME Arranged: N/A ?DME Agency: NA ?  ?  ?  ?  ?  ?  ?  ?  ? ? ?Social Determinants of Health (SDOH) Interventions ?  ? ?Readmission Risk Interventions ?Readmission Risk Prevention Plan 10/18/2021  ?Transportation Screening Complete  ?Medication Review Press photographer) Complete  ?PCP or Specialist appointment within 3-5 days of discharge Complete  ?[place] or Home Care Consult Complete  ?SW Recovery Care/Counseling Consult Complete  ?Palliative Care Screening Not Applicable  ?[place] Not Applicable  ?Some recent data might be hidden  ? ? ?

## 2021-10-20 NOTE — Progress Notes (Signed)
PROGRESS NOTE    Matthew [name]  XVQ:[id number] DOB: 07/22/1951 DOA: 10/17/2021 PCP: [name], MD  Assessment & Plan:   Principal Problem:   Rapid atrial fibrillation ([place]) Active Problems:   Acute on chronic respiratory failure with hypoxia (HCC)   Acute on chronic diastolic CHF (congestive heart failure) (HCC)   Hypertension   COPD (chronic obstructive pulmonary disease) (HCC)   CKD (chronic kidney disease) stage 4, GFR 15-29 ml/min (HCC)   Obesity, Class III, BMI 40-49.9 (morbid obesity) (HCC)   OSA (obstructive sleep apnea)   NICM (nonischemic cardiomyopathy) (HCC)   PAF: w/ RVR. Likely secondary to medication non-compliance. Continue on eliquis, metoprolol    Unlikely bacteremia: 1/4 with s epi, likely contaminant. Repeat blood cxs NGTD   Acute on chronic systolic CHF: continue on lasix. Monitor I/Os   Acute hypoxic respiratory failure: 88% on RA documented in ED. Weaned off of BiPAP. Continue on supplemental oxygen and wean as tolerated    HTN: continue on metoprolol    COPD: w/o exacerbation. Continue on bronchodilators & encourage incentive spirometry   CKDIV: Cr is labile. Avoid nephrotoxic meds    OSA: not on CPAP at home   BPH: continue on home dose of flomax   Depression: severity unknown. Continue on home dose of duloxetine, mirtazapine    Gout: not recently taking allopurinol    ACD: likely secondary to CKD. H&H are labile.   Obesity: BMI 35.9. Complicates overall care & prognosis    DVT prophylaxis: eliquis  Code Status: full Family Communication:  Disposition Plan: possible to d/c SNF vs home w/ HH   Level of care: Progressive  Status is: Inpatient Remains inpatient appropriate because: will likely need SNF placemtn   Consultants:  Cardio   Procedures:  Antimicrobials:   Subjective: Pt c/o fatigue   Objective: Vitals:   10/19/21 2328 10/20/21 0435 10/20/21 0740 10/20/21 0756  BP: (!) 113/51 (!) 113/100 (!) 122/94   Pulse: 79  71 (!) 103   Resp: '18 14 16   '$ Temp: 98.2 F (36.8 C) 98.4 F (36.9 C) 97.7 F (36.5 C)   TempSrc:   Oral   SpO2: 100% 97% 94% 96%  Weight:  115.2 kg    Height:        Intake/Output Summary (Last 24 hours) at 10/20/2021 0800 Last data filed at 10/20/2021 0500 Gross per 24 hour  Intake 695 ml  Output 2250 ml  Net -1555 ml   Filed Weights   10/18/21 1837 10/19/21 0500 10/20/21 0435  Weight: 127.5 kg 127.1 kg 115.2 kg    Examination:  General exam: Appears comfortable. Obese Respiratory system: decreased breath sounds b/l  Cardiovascular system: irregularly irregular. No rubs or gallops Gastrointestinal system: Abd is soft, NT, obese & normal bowel sounds  Central nervous system: alert and oriented. Moves all extremities Psychiatry: Judgement and insight appear normal. Flat mood and affect    Data Reviewed: I have personally reviewed following labs and imaging studies  CBC: Recent Labs  Lab 10/17/21 1915 10/19/21 0707 10/20/21 0545  WBC 8.0 5.2 4.6  NEUTROABS 5.8  --   --   HGB 10.1* 8.3* 8.1*  HCT 36.6* 29.8* 27.9*  MCV 97.1 95.2 93.3  PLT 333 255 093   Basic Metabolic Panel: Recent Labs  Lab 10/17/21 1915 10/18/21 1004 10/19/21 0707 10/20/21 0545  NA 139 138 137 136  K 4.4 4.2 4.2 3.9  CL 106 107 104 100  CO2 '23 24 25 25  '$ GLUCOSE  120* 108* 121* 112*  BUN 30* 31* 34* 40*  CREATININE 2.45* 2.48* 2.79* 2.96*  CALCIUM 9.7 8.9 9.3 9.4   GFR: Estimated Creatinine Clearance: 30.9 mL/min (A) (by C-G formula based on SCr of 2.96 mg/dL (H)). Liver Function Tests: Recent Labs  Lab 10/17/21 1915  AST 12*  ALT 8  ALKPHOS 58  BILITOT 0.6  PROT 7.3  ALBUMIN 2.4*   No results for input(s): LIPASE, AMYLASE in the last 168 hours. No results for input(s): AMMONIA in the last 168 hours. Coagulation Profile: No results for input(s): INR, PROTIME in the last 168 hours. Cardiac Enzymes: No results for input(s): CKTOTAL, CKMB, CKMBINDEX, TROPONINI in the last 168  hours. BNP (last 3 results) No results for input(s): PROBNP in the last 8760 hours. HbA1C: No results for input(s): HGBA1C in the last 72 hours. CBG: Recent Labs  Lab 10/19/21 1647  GLUCAP 120*   Lipid Profile: No results for input(s): CHOL, HDL, LDLCALC, TRIG, CHOLHDL, LDLDIRECT in the last 72 hours. Thyroid Function Tests: No results for input(s): TSH, T4TOTAL, FREET4, T3FREE, THYROIDAB in the last 72 hours. Anemia Panel: No results for input(s): VITAMINB12, FOLATE, FERRITIN, TIBC, IRON, RETICCTPCT in the last 72 hours. Sepsis Labs: Recent Labs  Lab 10/17/21 1915 10/17/21 2116  PROCALCITON 5.36  --   LATICACIDVEN 1.4 0.9    Recent Results (from the past 240 hour(s))  Resp Panel by RT-PCR (Flu A&B, Covid) Nasopharyngeal Swab     Status: None   Collection Time: 10/17/21  7:16 PM   Specimen: Nasopharyngeal Swab; Nasopharyngeal(NP) swabs in vial transport medium  Result Value Ref Range Status   SARS Coronavirus 2 by RT PCR NEGATIVE NEGATIVE Final    Comment: (NOTE) SARS-CoV-2 target nucleic acids are NOT DETECTED.  The SARS-CoV-2 RNA is generally detectable in upper respiratory specimens during the acute phase of infection. The lowest concentration of SARS-CoV-2 viral copies this assay can detect is 138 copies/mL. A negative result does not preclude SARS-Cov-2 infection and should not be used as the sole basis for treatment or other patient management decisions. A negative result may occur with  improper specimen collection/handling, submission of specimen other than nasopharyngeal swab, presence of viral mutation(s) within the areas targeted by this assay, and inadequate number of viral copies(<138 copies/mL). A negative result must be combined with clinical observations, patient history, and epidemiological information. The expected result is Negative.  Fact Sheet for Patients:  EntrepreneurPulse.com.au  Fact Sheet for Healthcare Providers:   IncredibleEmployment.be  This test is no t yet approved or cleared by the [place] FDA and  has been authorized for detection and/or diagnosis of SARS-CoV-2 by FDA under an Emergency Use Authorization (EUA). This EUA will remain  in effect (meaning this test can be used) for the duration of the COVID-19 declaration under Section 564(b)(1) of the Act, 21 U.S.C.section 360bbb-3(b)(1), unless the authorization is terminated  or revoked sooner.       Influenza A by PCR NEGATIVE NEGATIVE Final   Influenza B by PCR NEGATIVE NEGATIVE Final    Comment: (NOTE) The Xpert Xpress SARS-CoV-2/FLU/RSV plus assay is intended as an aid in the diagnosis of influenza from Nasopharyngeal swab specimens and should not be used as a sole basis for treatment. Nasal washings and aspirates are unacceptable for Xpert Xpress SARS-CoV-2/FLU/RSV testing.  Fact Sheet for Patients: EntrepreneurPulse.com.au  Fact Sheet for Healthcare Providers: IncredibleEmployment.be  This test is not yet approved or cleared by the [place] and has been authorized for  detection and/or diagnosis of SARS-CoV-2 by FDA under an Emergency Use Authorization (EUA). This EUA will remain in effect (meaning this test can be used) for the duration of the COVID-19 declaration under Section 564(b)(1) of the Act, 21 U.S.C. section 360bbb-3(b)(1), unless the authorization is terminated or revoked.  Performed at [facility], [address] [place], [place] [address]   Blood culture (routine x 2)     Status: Abnormal (Preliminary result)   Collection Time: 10/17/21  7:16 PM   Specimen: BLOOD  Result Value Ref Range Status   Specimen Description   Final    BLOOD LEFT WRIST Performed at [facility], [address], [place], [place] [address]    Special Requests   Final    BOTTLES DRAWN AEROBIC AND ANAEROBIC Blood Culture adequate  volume Performed at [facility], [address] [place], [place] [address]    Culture  Setup Time   Final    GRAM POSITIVE COCCI IN BOTH AEROBIC AND ANAEROBIC BOTTLES CRITICAL RESULT CALLED TO, READ BACK BY AND VERIFIED WITH: Lonie Peak PHARMD 3299 10/18/21 HNM    Culture (A)  Final    STAPHYLOCOCCUS EPIDERMIDIS THE SIGNIFICANCE OF ISOLATING THIS ORGANISM FROM A SINGLE SET OF BLOOD CULTURES WHEN MULTIPLE SETS ARE DRAWN IS UNCERTAIN. PLEASE NOTIFY THE MICROBIOLOGY DEPARTMENT WITHIN ONE WEEK IF SPECIATION AND SENSITIVITIES ARE REQUIRED. Performed at [facility], [place] [address], [place], [place] [address]    Report Status PENDING  Incomplete  Blood culture (routine x 2)     Status: None (Preliminary result)   Collection Time: 10/17/21  7:16 PM   Specimen: BLOOD  Result Value Ref Range Status   Specimen Description BLOOD RIGHT ARM  Final   Special Requests   Final    BOTTLES DRAWN AEROBIC AND ANAEROBIC Blood Culture results may not be optimal due to an inadequate volume of blood received in culture bottles   Culture   Final    NO GROWTH 3 DAYS Performed at [facility], [address] [place], [place] [address]    Report Status PENDING  Incomplete  Blood Culture ID Panel (Reflexed)     Status: Abnormal   Collection Time: 10/17/21  7:16 PM  Result Value Ref Range Status   Enterococcus faecalis NOT DETECTED NOT DETECTED Final   Enterococcus Faecium NOT DETECTED NOT DETECTED Final   Listeria monocytogenes NOT DETECTED NOT DETECTED Final   Staphylococcus species DETECTED (A) NOT DETECTED Final    Comment: CRITICAL RESULT CALLED TO, READ BACK BY AND VERIFIED WITH: Lonie Peak PHARMD 1422 10/18/21 HNM    Staphylococcus aureus (BCID) NOT DETECTED NOT DETECTED Final   Staphylococcus epidermidis DETECTED (A) NOT DETECTED Final    Comment: CRITICAL RESULT CALLED TO, READ BACK BY AND VERIFIED WITH: Lonie Peak PHARMD 1422 10/18/21 HNM    Staphylococcus  lugdunensis NOT DETECTED NOT DETECTED Final   Streptococcus species NOT DETECTED NOT DETECTED Final   Streptococcus agalactiae NOT DETECTED NOT DETECTED Final   Streptococcus pneumoniae NOT DETECTED NOT DETECTED Final   Streptococcus pyogenes NOT DETECTED NOT DETECTED Final   A.calcoaceticus-baumannii NOT DETECTED NOT DETECTED Final   Bacteroides fragilis NOT DETECTED NOT DETECTED Final   Enterobacterales NOT DETECTED NOT DETECTED Final   Enterobacter cloacae complex NOT DETECTED NOT DETECTED Final   Escherichia coli NOT DETECTED NOT DETECTED Final   Klebsiella aerogenes NOT DETECTED NOT DETECTED Final   Klebsiella oxytoca NOT DETECTED NOT DETECTED Final   Klebsiella pneumoniae  NOT DETECTED NOT DETECTED Final   Proteus species NOT DETECTED NOT DETECTED Final   Salmonella species NOT DETECTED NOT DETECTED Final   Serratia marcescens NOT DETECTED NOT DETECTED Final   Haemophilus influenzae NOT DETECTED NOT DETECTED Final   Neisseria meningitidis NOT DETECTED NOT DETECTED Final   Pseudomonas aeruginosa NOT DETECTED NOT DETECTED Final   Stenotrophomonas maltophilia NOT DETECTED NOT DETECTED Final   Candida albicans NOT DETECTED NOT DETECTED Final   Candida auris NOT DETECTED NOT DETECTED Final   Candida glabrata NOT DETECTED NOT DETECTED Final   Candida krusei NOT DETECTED NOT DETECTED Final   Candida parapsilosis NOT DETECTED NOT DETECTED Final   Candida tropicalis NOT DETECTED NOT DETECTED Final   Cryptococcus neoformans/gattii NOT DETECTED NOT DETECTED Final   Methicillin resistance mecA/C NOT DETECTED NOT DETECTED Final    Comment: Performed at [facility], [place], [place] [address]  CULTURE, BLOOD (ROUTINE X 2) w Reflex to ID Panel     Status: None (Preliminary result)   Collection Time: 10/18/21  7:05 PM   Specimen: BLOOD  Result Value Ref Range Status   Specimen Description BLOOD [facility]  Final   Special Requests BOTTLES DRAWN AEROBIC AND ANAEROBIC BCAV   Final   Culture   Final    NO GROWTH 2 DAYS Performed at [facility], [address] [place], [place] [address]    Report Status PENDING  Incomplete  CULTURE, BLOOD (ROUTINE X 2) w Reflex to ID Panel     Status: None (Preliminary result)   Collection Time: 10/18/21  7:05 PM   Specimen: BLOOD  Result Value Ref Range Status   Specimen Description BLOOD LAC  Final   Special Requests BOTTLES DRAWN AEROBIC AND ANAEROBIC BCAV  Final   Culture   Final    NO GROWTH 2 DAYS Performed at [facility], [address] [place], [place] [address]    Report Status PENDING  Incomplete         Radiology Studies: ECHOCARDIOGRAM COMPLETE  Result Date: 10/18/2021    ECHOCARDIOGRAM REPORT   Patient Name:   JOSHUAH MINELLA Smyre Date of Exam: 10/18/2021 Medical Rec #:  [id number]      Height:       66.0 in Accession #:    [id number]     Weight:       284.4 lb Date of Birth:  [birth year]       BSA:          2.323 m Patient Age:    71 years       BP:           130/109 mmHg Patient Gender: M              HR:           123 bpm. Exam Location:  [facility] Procedure: 2D Echo, Color Doppler and Cardiac Doppler Indications:     I50.31 congestive heart failure-Acute Diastolic  History:         Patient has prior history of Echocardiogram examinations, most                  recent 06/13/2021. CHF, CAD, CKD and COPD; Risk                  Factors:Hypertension.  Sonographer:     Charmayne Sheer Referring Phys:  [id number] Athena Masse Diagnosing Phys: Nelva Bush MD  Sonographer Comments: Technically difficult study due to poor  echo windows. Image acquisition challenging due to patient body habitus and Image acquisition challenging due to COPD. IMPRESSIONS  1. Left ventricular ejection fraction, by estimation, is 35 to 40%. The left ventricle has moderately decreased function. Left ventricular endocardial border not optimally defined to evaluate regional wall motion. The left ventricular internal cavity size was mildly to  moderately dilated. There is moderate left ventricular hypertrophy. Left ventricular diastolic function could not be evaluated.  2. Right ventricular systolic function is normal. The right ventricular size is mildly enlarged. Mildly increased right ventricular wall thickness.  3. Left atrial size was mildly dilated.  4. Right atrial size was mildly dilated.  5. The mitral valve was not well visualized. Mild mitral valve regurgitation.  6. The aortic valve is tricuspid. Aortic valve regurgitation is not visualized. No aortic stenosis is present.  7. The inferior vena cava is dilated in size with >50% respiratory variability, suggesting right atrial pressure of 8 mmHg. FINDINGS  Left Ventricle: Left ventricular ejection fraction, by estimation, is 35 to 40%. The left ventricle has moderately decreased function. Left ventricular endocardial border not optimally defined to evaluate regional wall motion. Definity contrast agent was given IV to delineate the left ventricular endocardial borders. The left ventricular internal cavity size was mildly to moderately dilated. There is moderate left ventricular hypertrophy. Left ventricular diastolic function could not be evaluated due  to atrial fibrillation. Left ventricular diastolic function could not be evaluated. Right Ventricle: The right ventricular size is mildly enlarged. Mildly increased right ventricular wall thickness. Right ventricular systolic function is normal. Left Atrium: Left atrial size was mildly dilated. Right Atrium: Right atrial size was mildly dilated. Pericardium: There is no evidence of pericardial effusion. Presence of epicardial fat layer. Mitral Valve: The mitral valve was not well visualized. Mild mitral valve regurgitation. MV peak gradient, 6.0 mmHg. The mean mitral valve gradient is 2.0 mmHg. Tricuspid Valve: The tricuspid valve is not well visualized. Tricuspid valve regurgitation is mild. Aortic Valve: The aortic valve is tricuspid. Aortic  valve regurgitation is not visualized. No aortic stenosis is present. Aortic valve mean gradient measures 4.0 mmHg. Aortic valve peak gradient measures 7.7 mmHg. Aortic valve area, by VTI measures 3.56 cm. Pulmonic Valve: The pulmonic valve was not well visualized. Pulmonic valve regurgitation is trivial. No evidence of pulmonic stenosis. Aorta: The aortic root is normal in size and structure. Pulmonary Artery: The pulmonary artery is not well seen. Venous: The inferior vena cava is dilated in size with greater than 50% respiratory variability, suggesting right atrial pressure of 8 mmHg. IAS/Shunts: The interatrial septum was not well visualized.  LEFT VENTRICLE PLAX 2D LVIDd:         6.05 cm   Diastology LVIDs:         4.57 cm   LV e' medial:    8.49 cm/s LV PW:         1.38 cm   LV E/e' medial:  11.6 LV IVS:        1.02 cm   LV e' lateral:   10.00 cm/s LVOT diam:     2.30 cm   LV E/e' lateral: 9.9 LV SV:         69 LV SV Index:   30 LVOT Area:     4.15 cm  RIGHT VENTRICLE RV Basal diam:  4.75 cm LEFT ATRIUM             Index        RIGHT ATRIUM  Index LA diam:        4.70 cm 2.02 cm/m   RA Area:     22.50 cm LA Vol (A2C):   86.4 ml 37.20 ml/m  RA Volume:   72.30 ml  31.13 ml/m LA Vol (A4C):   71.2 ml 30.66 ml/m LA Biplane Vol: 78.1 ml 33.63 ml/m  AORTIC VALVE                    PULMONIC VALVE AV Area (Vmax):    3.47 cm     PV Vmax:       0.89 m/s AV Area (Vmean):   3.34 cm     PV Vmean:      59.100 cm/s AV Area (VTI):     3.56 cm     PV VTI:        0.120 m AV Vmax:           139.00 cm/s  PV Peak grad:  3.2 mmHg AV Vmean:          93.800 cm/s  PV Mean grad:  2.0 mmHg AV VTI:            0.195 m AV Peak Grad:      7.7 mmHg AV Mean Grad:      4.0 mmHg LVOT Vmax:         116.00 cm/s LVOT Vmean:        75.300 cm/s LVOT VTI:          0.167 m LVOT/AV VTI ratio: 0.86  AORTA Ao Root diam: 3.80 cm MITRAL VALVE MV Area (PHT): 5.43 cm    SHUNTS MV Area VTI:   4.78 cm    Systemic VTI:  0.17 m MV Peak grad:   6.0 mmHg    Systemic Diam: 2.30 cm MV Mean grad:  2.0 mmHg MV Vmax:       1.22 m/s MV Vmean:      67.1 cm/s MV Decel Time: 140 msec MV E velocity: 98.90 cm/s Nelva Bush MD Electronically signed by Nelva Bush MD Signature Date/Time: 10/18/2021/6:33:13 PM    Final         Scheduled Meds:  apixaban  5 mg Oral BID   DULoxetine  60 mg Oral BID   furosemide  40 mg Intravenous Daily   levalbuterol  0.63 mg Nebulization TID   melatonin  5 mg Oral QHS   metoprolol tartrate  12.5 mg Oral Q6H   mirtazapine  15 mg Oral QHS   mometasone-formoterol  2 puff Inhalation BID   multivitamin with minerals  1 tablet Oral Daily   tamsulosin  0.4 mg Oral QPC supper   Continuous Infusions:     LOS: 3 days    Time spent: 25 mins     Wyvonnia Dusky, MD [facility] Pager [phone]  If 7PM-7AM, please contact night-coverage 10/20/2021, 8:00 AM

## 2021-10-20 NOTE — Progress Notes (Addendum)
Progress Note  Patient Name: Matthew [name] Date of Encounter: 10/20/2021  [facility] Cardiologist: Nelva Bush, MD   Subjective   Reports breathing much improved, close to his baseline Wonders if he needs rehab.  Lives with his wife No cough, no sputum Telemetry reviewed, atrial fibrillation rate 110 He reports he is asymptomatic  Inpatient Medications    Scheduled Meds:  apixaban  5 mg Oral BID   DULoxetine  60 mg Oral BID   furosemide  40 mg Intravenous Daily   levalbuterol  0.63 mg Nebulization TID   melatonin  5 mg Oral QHS   metoprolol tartrate  25 mg Oral BID   mirtazapine  15 mg Oral QHS   mometasone-formoterol  2 puff Inhalation BID   multivitamin with minerals  1 tablet Oral Daily   tamsulosin  0.4 mg Oral QPC supper   Continuous Infusions:  PRN Meds: acetaminophen, ipratropium-albuterol, ondansetron (ZOFRAN) IV   Vital Signs    Vitals:   10/19/21 2328 10/20/21 0435 10/20/21 0740 10/20/21 0756  BP: (!) 113/51 (!) 113/100 (!) 122/94   Pulse: 79 71 (!) 103   Resp: '18 14 16   '$ Temp: 98.2 F (36.8 C) 98.4 F (36.9 C) 97.7 F (36.5 C)   TempSrc:   Oral   SpO2: 100% 97% 94% 96%  Weight:  115.2 kg    Height:        Intake/Output Summary (Last 24 hours) at 10/20/2021 1044 Last data filed at 10/20/2021 1027 Gross per 24 hour  Intake 335 ml  Output 1850 ml  Net -1515 ml   Last 3 Weights 10/20/2021 10/19/2021 10/18/2021  Weight (lbs) 253 lb 15.5 oz 280 lb 3.3 oz 281 lb 1.4 oz  Weight (kg) 115.2 kg 127.1 kg 127.5 kg      Telemetry    Atrial fibrillation rate 110- Personally Reviewed  ECG     - Personally Reviewed  Physical Exam   GEN: No acute distress.   Neck: No JVD Cardiac: Irregularly irregular, no murmurs, rubs, or gallops.  Respiratory: Clear to auscultation bilaterally. GI: Soft, nontender, non-distended  MS: No edema; No deformity. Neuro:  Nonfocal  Psych: Normal affect   Labs    High Sensitivity Troponin:   Recent Labs  Lab  10/17/21 1915 10/17/21 2116  TROPONINIHS 15 13     Chemistry Recent Labs  Lab 10/17/21 1915 10/18/21 1004 10/19/21 0707 10/20/21 0545  NA 139 138 137 136  K 4.4 4.2 4.2 3.9  CL 106 107 104 100  CO2 '23 24 25 25  '$ GLUCOSE 120* 108* 121* 112*  BUN 30* 31* 34* 40*  CREATININE 2.45* 2.48* 2.79* 2.96*  CALCIUM 9.7 8.9 9.3 9.4  PROT 7.3  --   --   --   ALBUMIN 2.4*  --   --   --   AST 12*  --   --   --   ALT 8  --   --   --   ALKPHOS 58  --   --   --   BILITOT 0.6  --   --   --   GFRNONAA 27* 27* 23* 22*  ANIONGAP '10 7 8 11    '$ Lipids No results for input(s): CHOL, TRIG, HDL, LABVLDL, LDLCALC, CHOLHDL in the last 168 hours.  Hematology Recent Labs  Lab 10/17/21 1915 10/19/21 0707 10/20/21 0545  WBC 8.0 5.2 4.6  RBC 3.77* 3.13* 2.99*  HGB 10.1* 8.3* 8.1*  HCT 36.6* 29.8* 27.9*  MCV 97.1 95.2 93.3  MCH 26.8 26.5 27.1  MCHC 27.6* 27.9* 29.0*  RDW 14.6 14.3 14.0  PLT 333 255 249   Thyroid No results for input(s): TSH, FREET4 in the last 168 hours.  BNP Recent Labs  Lab 10/17/21 1916  BNP 691.7*    DDimer No results for input(s): DDIMER in the last 168 hours.   Radiology    ECHOCARDIOGRAM COMPLETE  Result Date: 10/18/2021    ECHOCARDIOGRAM REPORT   Patient Name:   Matthew [name] Gift Date of Exam: 10/18/2021 Medical Rec #:  [id number]      Height:       66.0 in Accession #:    [id number]     Weight:       284.4 lb Date of Birth:  02-16-1951       BSA:          2.323 m Patient Age:    71 years       BP:           130/109 mmHg Patient Gender: M              HR:           123 bpm. Exam Location:  [facility] Procedure: 2D Echo, Color Doppler and Cardiac Doppler Indications:     I50.31 congestive heart failure-Acute Diastolic  History:         Patient has prior history of Echocardiogram examinations, most                  recent 06/13/2021. CHF, CAD, CKD and COPD; Risk                  Factors:Hypertension.  Sonographer:     Charmayne Sheer Referring Phys:  [id number] Athena Masse Diagnosing Phys:  Nelva Bush MD  Sonographer Comments: Technically difficult study due to poor echo windows. Image acquisition challenging due to patient body habitus and Image acquisition challenging due to COPD. IMPRESSIONS  1. Left ventricular ejection fraction, by estimation, is 35 to 40%. The left ventricle has moderately decreased function. Left ventricular endocardial border not optimally defined to evaluate regional wall motion. The left ventricular internal cavity size was mildly to moderately dilated. There is moderate left ventricular hypertrophy. Left ventricular diastolic function could not be evaluated.  2. Right ventricular systolic function is normal. The right ventricular size is mildly enlarged. Mildly increased right ventricular wall thickness.  3. Left atrial size was mildly dilated.  4. Right atrial size was mildly dilated.  5. The mitral valve was not well visualized. Mild mitral valve regurgitation.  6. The aortic valve is tricuspid. Aortic valve regurgitation is not visualized. No aortic stenosis is present.  7. The inferior vena cava is dilated in size with >50% respiratory variability, suggesting right atrial pressure of 8 mmHg. FINDINGS  Left Ventricle: Left ventricular ejection fraction, by estimation, is 35 to 40%. The left ventricle has moderately decreased function. Left ventricular endocardial border not optimally defined to evaluate regional wall motion. Definity contrast agent was given IV to delineate the left ventricular endocardial borders. The left ventricular internal cavity size was mildly to moderately dilated. There is moderate left ventricular hypertrophy. Left ventricular diastolic function could not be evaluated due  to atrial fibrillation. Left ventricular diastolic function could not be evaluated. Right Ventricle: The right ventricular size is mildly enlarged. Mildly increased right ventricular wall thickness. Right ventricular systolic function is normal. Left Atrium: Left atrial  size was mildly dilated. Right  Atrium: Right atrial size was mildly dilated. Pericardium: There is no evidence of pericardial effusion. Presence of epicardial fat layer. Mitral Valve: The mitral valve was not well visualized. Mild mitral valve regurgitation. MV peak gradient, 6.0 mmHg. The mean mitral valve gradient is 2.0 mmHg. Tricuspid Valve: The tricuspid valve is not well visualized. Tricuspid valve regurgitation is mild. Aortic Valve: The aortic valve is tricuspid. Aortic valve regurgitation is not visualized. No aortic stenosis is present. Aortic valve mean gradient measures 4.0 mmHg. Aortic valve peak gradient measures 7.7 mmHg. Aortic valve area, by VTI measures 3.56 cm. Pulmonic Valve: The pulmonic valve was not well visualized. Pulmonic valve regurgitation is trivial. No evidence of pulmonic stenosis. Aorta: The aortic root is normal in size and structure. Pulmonary Artery: The pulmonary artery is not well seen. Venous: The inferior vena cava is dilated in size with greater than 50% respiratory variability, suggesting right atrial pressure of 8 mmHg. IAS/Shunts: The interatrial septum was not well visualized.  LEFT VENTRICLE PLAX 2D LVIDd:         6.05 cm   Diastology LVIDs:         4.57 cm   LV e' medial:    8.49 cm/s LV PW:         1.38 cm   LV E/e' medial:  11.6 LV IVS:        1.02 cm   LV e' lateral:   10.00 cm/s LVOT diam:     2.30 cm   LV E/e' lateral: 9.9 LV SV:         69 LV SV Index:   30 LVOT Area:     4.15 cm  RIGHT VENTRICLE RV Basal diam:  4.75 cm LEFT ATRIUM             Index        RIGHT ATRIUM           Index LA diam:        4.70 cm 2.02 cm/m   RA Area:     22.50 cm LA Vol (A2C):   86.4 ml 37.20 ml/m  RA Volume:   72.30 ml  31.13 ml/m LA Vol (A4C):   71.2 ml 30.66 ml/m LA Biplane Vol: 78.1 ml 33.63 ml/m  AORTIC VALVE                    PULMONIC VALVE AV Area (Vmax):    3.47 cm     PV Vmax:       0.89 m/s AV Area (Vmean):   3.34 cm     PV Vmean:      59.100 cm/s AV Area (VTI):      3.56 cm     PV VTI:        0.120 m AV Vmax:           139.00 cm/s  PV Peak grad:  3.2 mmHg AV Vmean:          93.800 cm/s  PV Mean grad:  2.0 mmHg AV VTI:            0.195 m AV Peak Grad:      7.7 mmHg AV Mean Grad:      4.0 mmHg LVOT Vmax:         116.00 cm/s LVOT Vmean:        75.300 cm/s LVOT VTI:          0.167 m LVOT/AV VTI ratio: 0.86  AORTA Ao Root diam:  3.80 cm MITRAL VALVE MV Area (PHT): 5.43 cm    SHUNTS MV Area VTI:   4.78 cm    Systemic VTI:  0.17 m MV Peak grad:  6.0 mmHg    Systemic Diam: 2.30 cm MV Mean grad:  2.0 mmHg MV Vmax:       1.22 m/s MV Vmean:      67.1 cm/s MV Decel Time: 140 msec MV E velocity: 98.90 cm/s Nelva Bush MD Electronically signed by Nelva Bush MD Signature Date/Time: 10/18/2021/6:33:13 PM    Final     Cardiac Studies       Patient Profile   71 y.o. male history of nonischemic cardiomyopathy EF 35 to 40%, paroxysmal atrial fibrillation, asthma COPD, chronic respiratory failure, obesity presenting with shortness of breath and palpitations, being seen for A-fib RVR.  Assessment & Plan    A-fib RVR Longstanding persistent, has had this for several months, prior hospitalization December 2022 with anemia, brief hold of his Eliquis at that time, declined GI work-up --Presenting with COPD exacerbation,/respiratory distress requiring BiPAP, elevated heart rate --Previously had been rate controlled on high-dose calcium channel blocker, metoprolol --- Calcium channel blocker held in the setting of cardiomyopathy this admission --- Secondary to hypotension (possibly from underlying infection), is only been able to tolerate low-dose metoprolol to tartrate -Blood pressures slowly improving, will change metoprolol to tartrate to 25 twice daily As outpatient was taking 50 twice daily.  We will hopefully transition to metoprolol succinate either as inpatient or outpatient -Not a good candidate for digoxin given renal failure.  Ideally would like to try to avoid  using amiodarone in the setting of underlying lung disease -Continue Eliquis. Has had previous evaluation by EP, Dr. Rayann Heman   2.  Cardiomyopathy,  -likely nonischemic/tachycardia induced Metoprolol tartrate 25 twice daily Tomorrow we will transition back to oral Lasix (given climbing renal function will restart outpatient dose of 20 daily, though if renal function stabilizes may need 40 daily)   3.  Asthma, respiratory failure, expiratory wheezing on exam Initially requiring BiPAP Likely COPD exacerbation, atrial fibrillation with RVR contributing -Breathing treatment, Appears close to his baseline    Total encounter time more than 50 minutes  Greater than 50% was spent in counseling and coordination of care with the patient   For questions or updates, please contact [facility] Please consult [email or web address] for contact info under        Signed, Ida Rogue, MD  10/20/2021, 10:44 AM

## 2021-10-20 NOTE — Care Management Important Message (Signed)
Important Message ? ?Patient Details  ?Name: Matthew [name] ?MRN: [id number] ?Date of Birth: 1951/07/11 ? ? ?Medicare Important Message Given:  Yes ? ? ? ? ?Dannette Barbara ?10/20/2021, 3:22 PM ?

## 2021-10-21 DIAGNOSIS — I4891 Unspecified atrial fibrillation: Secondary | ICD-10-CM | POA: Diagnosis not present

## 2021-10-21 DIAGNOSIS — E669 Obesity, unspecified: Secondary | ICD-10-CM

## 2021-10-21 DIAGNOSIS — N184 Chronic kidney disease, stage 4 (severe): Secondary | ICD-10-CM | POA: Diagnosis not present

## 2021-10-21 LAB — BASIC METABOLIC PANEL
Anion gap: 4 — ABNORMAL LOW (ref 5–15)
BUN: 47 mg/dL — ABNORMAL HIGH (ref 8–23)
CO2: 29 mmol/L (ref 22–32)
Calcium: 8.9 mg/dL (ref 8.9–10.3)
Chloride: 102 mmol/L (ref 98–111)
Creatinine, Ser: 3.14 mg/dL — ABNORMAL HIGH (ref 0.61–1.24)
GFR, Estimated: 20 mL/min — ABNORMAL LOW (ref >60)
Glucose, Bld: 99 mg/dL (ref 70–99)
Potassium: 4.3 mmol/L (ref 3.5–5.1)
Sodium: 135 mmol/L (ref 135–145)

## 2021-10-21 LAB — CULTURE, BLOOD (ROUTINE X 2): Special Requests: ADEQUATE

## 2021-10-21 LAB — CBC
HCT: 28.1 % — ABNORMAL LOW (ref 39.0–52.0)
Hemoglobin: 8 g/dL — ABNORMAL LOW (ref 13.0–17.0)
MCH: 26.9 pg (ref 26.0–34.0)
MCHC: 28.5 g/dL — ABNORMAL LOW (ref 30.0–36.0)
MCV: 94.6 fL (ref 80.0–100.0)
Platelets: 247 10*3/uL (ref 150–400)
RBC: 2.97 MIL/uL — ABNORMAL LOW (ref 4.22–5.81)
RDW: 14.1 % (ref 11.5–15.5)
WBC: 3.6 10*3/uL — ABNORMAL LOW (ref 4.0–10.5)
nRBC: 0 % (ref 0.0–0.2)

## 2021-10-21 MED ORDER — METOPROLOL SUCCINATE ER 25 MG PO TB24
25.0000 mg | ORAL_TABLET | Freq: Two times a day (BID) | ORAL | Status: DC
  Administered 2021-10-21 – 2021-10-27 (×11): 25 mg via ORAL
  Filled 2021-10-21 (×12): qty 1

## 2021-10-21 MED ORDER — DOCUSATE SODIUM 100 MG PO CAPS
200.0000 mg | ORAL_CAPSULE | Freq: Two times a day (BID) | ORAL | Status: DC
  Administered 2021-10-21 – 2021-10-29 (×16): 200 mg via ORAL
  Filled 2021-10-21 (×17): qty 2

## 2021-10-21 MED ORDER — AMIODARONE HCL 200 MG PO TABS
200.0000 mg | ORAL_TABLET | Freq: Two times a day (BID) | ORAL | Status: DC
  Administered 2021-10-21 – 2021-10-24 (×6): 200 mg via ORAL
  Filled 2021-10-21 (×7): qty 1

## 2021-10-21 NOTE — Progress Notes (Signed)
Assumed care of pt at 1900. A&O x4. CPAP overnight. No c/o pain or palpitations. MEWS yellow at start of shift d/t HR 120s. Charge nurse notified of change. Scheduled medications given w/ no effect and provider then made aware of HR sustaining 110-130s. No new orders at this time.   ? ?Full assessment per flowsheets. Medication administration per MAR. Call bell within reach. Comfort and safety maintained.  ?

## 2021-10-21 NOTE — Progress Notes (Signed)
[facility]  ROUNDING NOTE   Subjective:   Matthew [name] is a 71 year old male with past medical conditions including obesity, A-fib on Eliquis, diastolic heart failure, COPD with as needed O2 use, and chronic kidney disease stage IV.  Patient presents to the emergency department with complaints of shortness of breath.  Patient was admitted for Rapid atrial fibrillation ([place]) [I48.91] Atrial fibrillation with rapid ventricular response ([facility]) [I48.91] Acute respiratory failure with hypoxemia ([place]) [J96.01]  Patient is known to our practice from previous admissions.  Patient was referred to our office for outpatient follow-up and has not yet scheduled an appointment.  Patient states shortness of breath progressed over 1 day.  Does not recall any precipitating events.  He reports associated symptoms of chest tightness without radiation and increased shortness of breath, work of breathing.  Denies increased lower extremity edema prior to event.  Reports nonproductive cough and occasional orthopnea.  Denies nausea, vomiting, and diarrhea.  Denies fever or chills.  Patient found to be in A-fib with heart rate 165.  Chest x-ray negative for acute findings.  Troponins within acceptable range.  Glucose 120, BUN 30, creatinine 2.45 with GFR 27, and albumin 2.4..  Respiratory panel negative for influenza and COVID-19.  Initial blood cultures positive for gram-positive cocci, Staphylococcus epidermidis.  We have been consulted to evaluate and manage acute kidney injury.   Objective:  Vital signs in last 24 hours:  Temp:  [97.5 F (36.4 C)-98 F (36.7 C)] 97.8 F (36.6 C) (03/10 1136) Pulse Rate:  [61-92] 61 (03/10 1136) Resp:  [16-21] 21 (03/10 1136) BP: (83-169)/(64-119) 87/65 (03/10 1136) SpO2:  [95 %-100 %] 99 % (03/10 1136) Weight:  [117.9 kg] 117.9 kg (03/10 0441)  Weight change: 2.7 kg Filed Weights   10/19/21 0500 10/20/21 0435 10/21/21 0441  Weight: 127.1 kg 115.2 kg 117.9  kg    Intake/Output: I/O last 3 completed shifts: In: 39 [P.O.:720] Out: 2300 [Urine:2300]   Intake/Output this shift:  Total I/O In: 0  Out: 500 [Urine:500]  Physical Exam: General: NAD, resting comfortably  Head: Normocephalic, atraumatic. Moist oral mucosal membranes  Eyes: Anicteric  Lungs:  Crackles, normal effort, 3 L [place]  Heart: Irregular rate and rhythm  Abdomen:  Soft, nontender, obese  Extremities: Trace peripheral edema.  Neurologic: Nonfocal, moving all four extremities  Skin: No lesions       Basic Metabolic Panel: Recent Labs  Lab 10/17/21 1915 10/18/21 1004 10/19/21 0707 10/20/21 0545 10/21/21 0607  NA 139 138 137 136 135  K 4.4 4.2 4.2 3.9 4.3  CL 106 107 104 100 102  CO2 '23 24 25 25 29  '$ GLUCOSE 120* 108* 121* 112* 99  BUN 30* 31* 34* 40* 47*  CREATININE 2.45* 2.48* 2.79* 2.96* 3.14*  CALCIUM 9.7 8.9 9.3 9.4 8.9    Liver Function Tests: Recent Labs  Lab 10/17/21 1915  AST 12*  ALT 8  ALKPHOS 58  BILITOT 0.6  PROT 7.3  ALBUMIN 2.4*   No results for input(s): LIPASE, AMYLASE in the last 168 hours. No results for input(s): AMMONIA in the last 168 hours.  CBC: Recent Labs  Lab 10/17/21 1915 10/19/21 0707 10/20/21 0545 10/21/21 0607  WBC 8.0 5.2 4.6 3.6*  NEUTROABS 5.8  --   --   --   HGB 10.1* 8.3* 8.1* 8.0*  HCT 36.6* 29.8* 27.9* 28.1*  MCV 97.1 95.2 93.3 94.6  PLT 333 255 249 247    Cardiac Enzymes: No results for  input(s): CKTOTAL, CKMB, CKMBINDEX, TROPONINI in the last 168 hours.  BNP: Invalid input(s): POCBNP  CBG: Recent Labs  Lab 10/19/21 1647  GLUCAP 120*    Microbiology: Results for orders placed or performed during the hospital encounter of 10/17/21  Resp Panel by RT-PCR (Flu A&B, Covid) Nasopharyngeal Swab     Status: None   Collection Time: 10/17/21  7:16 PM   Specimen: Nasopharyngeal Swab; Nasopharyngeal(NP) swabs in vial transport medium  Result Value Ref Range Status   SARS Coronavirus 2 by RT PCR  NEGATIVE NEGATIVE Final    Comment: (NOTE) SARS-CoV-2 target nucleic acids are NOT DETECTED.  The SARS-CoV-2 RNA is generally detectable in upper respiratory specimens during the acute phase of infection. The lowest concentration of SARS-CoV-2 viral copies this assay can detect is 138 copies/mL. A negative result does not preclude SARS-Cov-2 infection and should not be used as the sole basis for treatment or other patient management decisions. A negative result may occur with  improper specimen collection/handling, submission of specimen other than nasopharyngeal swab, presence of viral mutation(s) within the areas targeted by this assay, and inadequate number of viral copies(<138 copies/mL). A negative result must be combined with clinical observations, patient history, and epidemiological information. The expected result is Negative.  Fact Sheet for Patients:  EntrepreneurPulse.com.au  Fact Sheet for Healthcare Providers:  IncredibleEmployment.be  This test is no t yet approved or cleared by the [place] FDA and  has been authorized for detection and/or diagnosis of SARS-CoV-2 by FDA under an Emergency Use Authorization (EUA). This EUA will remain  in effect (meaning this test can be used) for the duration of the COVID-19 declaration under Section 564(b)(1) of the Act, 21 U.S.C.section 360bbb-3(b)(1), unless the authorization is terminated  or revoked sooner.       Influenza A by PCR NEGATIVE NEGATIVE Final   Influenza B by PCR NEGATIVE NEGATIVE Final    Comment: (NOTE) The Xpert Xpress SARS-CoV-2/FLU/RSV plus assay is intended as an aid in the diagnosis of influenza from Nasopharyngeal swab specimens and should not be used as a sole basis for treatment. Nasal washings and aspirates are unacceptable for Xpert Xpress SARS-CoV-2/FLU/RSV testing.  Fact Sheet for Patients: EntrepreneurPulse.com.au  Fact Sheet for  Healthcare Providers: IncredibleEmployment.be  This test is not yet approved or cleared by the [place] FDA and has been authorized for detection and/or diagnosis of SARS-CoV-2 by FDA under an Emergency Use Authorization (EUA). This EUA will remain in effect (meaning this test can be used) for the duration of the COVID-19 declaration under Section 564(b)(1) of the Act, 21 U.S.C. section 360bbb-3(b)(1), unless the authorization is terminated or revoked.  Performed at [facility], [address] [place], [place] [address]   Blood culture (routine x 2)     Status: Abnormal   Collection Time: 10/17/21  7:16 PM   Specimen: BLOOD  Result Value Ref Range Status   Specimen Description   Final    BLOOD LEFT WRIST Performed at [facility], [address], [place], [place] [address]    Special Requests   Final    BOTTLES DRAWN AEROBIC AND ANAEROBIC Blood Culture adequate volume Performed at [facility], [address], [place],  [address]    Culture  Setup Time   Final    GRAM POSITIVE COCCI IN BOTH AEROBIC AND ANAEROBIC BOTTLES CRITICAL RESULT CALLED TO, READ BACK BY AND VERIFIED WITHLonie Peak PHARMD 1422 10/18/21 HNM    Culture (A)  Final  STAPHYLOCOCCUS EPIDERMIDIS THE SIGNIFICANCE OF ISOLATING THIS ORGANISM FROM A SINGLE SET OF BLOOD CULTURES WHEN MULTIPLE SETS ARE DRAWN IS UNCERTAIN. PLEASE NOTIFY THE MICROBIOLOGY DEPARTMENT WITHIN ONE WEEK IF SPECIATION AND SENSITIVITIES ARE REQUIRED. Performed at [facility], [place] [address], [place], [place] [address]    Report Status 10/21/2021 FINAL  Final  Blood culture (routine x 2)     Status: None (Preliminary result)   Collection Time: 10/17/21  7:16 PM   Specimen: BLOOD  Result Value Ref Range Status   Specimen Description BLOOD RIGHT ARM  Final   Special Requests   Final    BOTTLES DRAWN AEROBIC AND ANAEROBIC Blood Culture results may not be optimal due to an  inadequate volume of blood received in culture bottles   Culture   Final    NO GROWTH 4 DAYS Performed at [facility], [address] [place], [place] [address]    Report Status PENDING  Incomplete  Blood Culture ID Panel (Reflexed)     Status: Abnormal   Collection Time: 10/17/21  7:16 PM  Result Value Ref Range Status   Enterococcus faecalis NOT DETECTED NOT DETECTED Final   Enterococcus Faecium NOT DETECTED NOT DETECTED Final   Listeria monocytogenes NOT DETECTED NOT DETECTED Final   Staphylococcus species DETECTED (A) NOT DETECTED Final    Comment: CRITICAL RESULT CALLED TO, READ BACK BY AND VERIFIED WITH: Lonie Peak PHARMD 1422 10/18/21 HNM    Staphylococcus aureus (BCID) NOT DETECTED NOT DETECTED Final   Staphylococcus epidermidis DETECTED (A) NOT DETECTED Final    Comment: CRITICAL RESULT CALLED TO, READ BACK BY AND VERIFIED WITH: Lonie Peak KDTOIZ 1245 10/18/21 HNM    Staphylococcus lugdunensis NOT DETECTED NOT DETECTED Final   Streptococcus species NOT DETECTED NOT DETECTED Final   Streptococcus agalactiae NOT DETECTED NOT DETECTED Final   Streptococcus pneumoniae NOT DETECTED NOT DETECTED Final   Streptococcus pyogenes NOT DETECTED NOT DETECTED Final   A.calcoaceticus-baumannii NOT DETECTED NOT DETECTED Final   Bacteroides fragilis NOT DETECTED NOT DETECTED Final   Enterobacterales NOT DETECTED NOT DETECTED Final   Enterobacter cloacae complex NOT DETECTED NOT DETECTED Final   Escherichia coli NOT DETECTED NOT DETECTED Final   Klebsiella aerogenes NOT DETECTED NOT DETECTED Final   Klebsiella oxytoca NOT DETECTED NOT DETECTED Final   Klebsiella pneumoniae NOT DETECTED NOT DETECTED Final   Proteus species NOT DETECTED NOT DETECTED Final   Salmonella species NOT DETECTED NOT DETECTED Final   Serratia marcescens NOT DETECTED NOT DETECTED Final   Haemophilus influenzae NOT DETECTED NOT DETECTED Final   Neisseria meningitidis NOT DETECTED NOT DETECTED Final    Pseudomonas aeruginosa NOT DETECTED NOT DETECTED Final   Stenotrophomonas maltophilia NOT DETECTED NOT DETECTED Final   Candida albicans NOT DETECTED NOT DETECTED Final   Candida auris NOT DETECTED NOT DETECTED Final   Candida glabrata NOT DETECTED NOT DETECTED Final   Candida krusei NOT DETECTED NOT DETECTED Final   Candida parapsilosis NOT DETECTED NOT DETECTED Final   Candida tropicalis NOT DETECTED NOT DETECTED Final   Cryptococcus neoformans/gattii NOT DETECTED NOT DETECTED Final   Methicillin resistance mecA/C NOT DETECTED NOT DETECTED Final    Comment: Performed at [facility], [address] [place], [place] [address]  CULTURE, BLOOD (ROUTINE X 2) w Reflex to ID Panel     Status: None (Preliminary result)   Collection Time: 10/18/21  7:05 PM   Specimen: BLOOD  Result Value Ref Range Status   Specimen Description BLOOD  [facility]  Final   Special Requests BOTTLES DRAWN AEROBIC AND ANAEROBIC BCAV  Final   Culture   Final    NO GROWTH 3 DAYS Performed at [facility], [address] [place], [place] [address]    Report Status PENDING  Incomplete  CULTURE, BLOOD (ROUTINE X 2) w Reflex to ID Panel     Status: None (Preliminary result)   Collection Time: 10/18/21  7:05 PM   Specimen: BLOOD  Result Value Ref Range Status   Specimen Description BLOOD LAC  Final   Special Requests BOTTLES DRAWN AEROBIC AND ANAEROBIC BCAV  Final   Culture   Final    NO GROWTH 3 DAYS Performed at [facility], [address], [place], [place] [address]    Report Status PENDING  Incomplete    Coagulation Studies: No results for input(s): LABPROT, INR in the last 72 hours.  Urinalysis: No results for input(s): COLORURINE, LABSPEC, PHURINE, GLUCOSEU, HGBUR, BILIRUBINUR, KETONESUR, PROTEINUR, UROBILINOGEN, NITRITE, LEUKOCYTESUR in the last 72 hours.  Invalid input(s): APPERANCEUR    Imaging: No results found.   Medications:     apixaban  5 mg Oral BID   docusate  sodium  200 mg Oral BID   DULoxetine  60 mg Oral BID   furosemide  20 mg Oral Daily   levalbuterol  0.63 mg Nebulization TID   melatonin  5 mg Oral QHS   metoprolol succinate  25 mg Oral BID   mirtazapine  15 mg Oral QHS   mometasone-formoterol  2 puff Inhalation BID   multivitamin with minerals  1 tablet Oral Daily   tamsulosin  0.4 mg Oral QPC supper   acetaminophen, ipratropium-albuterol, ondansetron (ZOFRAN) IV  Assessment/ Plan:  [name] is a 71 y.o.  male past medical conditions including obesity, A-fib on Eliquis, systolic heart failure, COPD with as needed O2 use, and chronic kidney disease stage IV.  Patient presents to the emergency department with complaints of shortness of breath.  Patient was admitted for Rapid atrial fibrillation ([place]) [I48.91] Atrial fibrillation with rapid ventricular response (HCC) [I48.91] Acute respiratory failure with hypoxemia ([facility]) [J96.01]   Acute Kidney Injury on chronic kidney disease stage IV with baseline creatinine 2.35 and GFR of 27 on 10/17/21.  Acute kidney injury secondary to hypervolemic state and diuresis Chronic kidney disease is secondary to hypertension and chronic heart failure Renal ultrasound ordered to evaluate obstruction No IV contrast exposure No acute indication of dialysis at this time.  Current creatinine 3.14 and has steadily increased over admission.  Patient received IV furosemide 40 mg twice daily on admission.  Now receiving p.o. furosemide 20 mg daily.  We will continue to monitor renal function and avoid nephrotoxic agents and therapies.  Also avoid hypotension.  Lab Results  Component Value Date   CREATININE 3.14 (H) 10/21/2021   CREATININE 2.96 (H) 10/20/2021   CREATININE 2.79 (H) 10/19/2021    Intake/Output Summary (Last 24 hours) at 10/21/2021 1413 Last data filed at 10/21/2021 1100 Gross per 24 hour  Intake 240 ml  Output 1800 ml  Net -1560 ml   2.  Hypotension with history of hypertension  with chronic kidney disease. Home regimen includes furosemide, metoprolol, and diltiazem.  Also prescribed tamsulosin.  Only receiving furosemide and metoprolol at this time. Blood pressure currently soft 87/65.  3.  Chronic systolic heart failure.  Echo from 10/18/2021 shows EF 35 to 40% with moderate LVH.  Currently receiving p.o. furosemide.  4.  Atrial  fibrillation with rapid RVR.  Cardiology following.  Recommend holding diltiazem due to decreased EF and lower blood pressures.  Continue Eliquis.  Suspect LV dysfunction, will consider TEE conversion next week.   LOS: [place] 3/10/20232:13 PM

## 2021-10-21 NOTE — Progress Notes (Signed)
OT Cancellation Note ? ?Patient Details ?Name: Matthew [name] ?MRN: [id number] ?DOB: Jan 24, 1951 ? ? ?Cancelled Treatment:    Reason Eval/Treat Not Completed: Other (comment) ?Pt declines to participate in OT at this time citing fatigue.  ? ?Gerrianne Scale, [place], OTR/L ?ascom [phone] ?10/21/21, 3:46 PM  ?

## 2021-10-21 NOTE — Progress Notes (Signed)
? ?Cardiology Progress Note  ? ?Patient Name: Matthew [name] ?Date of Encounter: 10/21/2021 ? ?Primary Cardiologist: Nelva Bush, MD ? ?Subjective  ? ?Breathing stable but not quite back to baseline.  Denies chest pain or palpitations.  Remains in A-fib with rates in the low 100s. ? ?Inpatient Medications  ?  ?Scheduled Meds: ? apixaban  5 mg Oral BID  ? docusate sodium  200 mg Oral BID  ? DULoxetine  60 mg Oral BID  ? furosemide  20 mg Oral Daily  ? levalbuterol  0.63 mg Nebulization TID  ? melatonin  5 mg Oral QHS  ? metoprolol tartrate  25 mg Oral BID  ? mirtazapine  15 mg Oral QHS  ? mometasone-formoterol  2 puff Inhalation BID  ? multivitamin with minerals  1 tablet Oral Daily  ? tamsulosin  0.4 mg Oral QPC supper  ? ?Continuous Infusions: ? ?PRN Meds: ?acetaminophen, ipratropium-albuterol, ondansetron (ZOFRAN) IV  ? ?Vital Signs  ?  ?Vitals:  ? 10/21/21 0440 10/21/21 0441 10/21/21 0734 10/21/21 1136  ?BP: 108/76  (!) 169/119 (!) 87/65  ?Pulse: 82  89 61  ?Resp: 16  (!) 21 (!) 21  ?Temp: 98 ?F (36.7 ?C)  (!) 97.5 ?F (36.4 ?C) 97.8 ?F (36.6 ?C)  ?TempSrc: Oral     ?SpO2: 100%  96% 99%  ?Weight:  117.9 kg    ?Height:      ? ? ?Intake/Output Summary (Last 24 hours) at 10/21/2021 1322 ?Last data filed at 10/21/2021 1100 ?Gross per 24 hour  ?Intake 480 ml  ?Output 1800 ml  ?Net -1320 ml  ? ?Filed Weights  ? 10/19/21 0500 10/20/21 0435 10/21/21 0441  ?Weight: 127.1 kg 115.2 kg 117.9 kg  ? ? ?Physical Exam  ? ?GEN: Obese, in no acute distress.  ?HEENT: Grossly normal.  ?Neck: Supple, obese, difficult to gauge JVP.  No carotid bruits or masses. ?Cardiac: Irregularly irregular, no murmurs, rubs, or gallops. No clubbing, cyanosis, edema.  Radials 2+, DP/PT 2+ and equal bilaterally.  ?Respiratory:  Respirations regular and unlabored, clear to auscultation bilaterally. ?GI: Obese, soft, nontender, nondistended, BS + x 4. ?MS: no deformity or atrophy. ?Skin: warm and dry, no rash. ?Neuro:  Strength and sensation are  intact. ?Psych: AAOx3.  Normal affect. ? ?Labs  ?  ?Chemistry ?Recent Labs  ?Lab 10/17/21 ?1915 10/18/21 ?1004 10/19/21 ?0707 10/20/21 ?7867 10/21/21 ?6720  ?NA 139   < > 137 136 135  ?K 4.4   < > 4.2 3.9 4.3  ?CL 106   < > 104 100 102  ?CO2 23   < > '25 25 29  '$ ?GLUCOSE 120*   < > 121* 112* 99  ?BUN 30*   < > 34* 40* 47*  ?CREATININE 2.45*   < > 2.79* 2.96* 3.14*  ?CALCIUM 9.7   < > 9.3 9.4 8.9  ?PROT 7.3  --   --   --   --   ?ALBUMIN 2.4*  --   --   --   --   ?AST 12*  --   --   --   --   ?ALT 8  --   --   --   --   ?ALKPHOS 58  --   --   --   --   ?BILITOT 0.6  --   --   --   --   ?GFRNONAA 27*   < > 23* 22* 20*  ?ANIONGAP 10   < > 8 11 4*  ? < > =  values in this interval not displayed.  ?  ? ?Hematology ?Recent Labs  ?Lab 10/19/21 ?0707 10/20/21 ?8101 10/21/21 ?7510  ?WBC 5.2 4.6 3.6*  ?RBC 3.13* 2.99* 2.97*  ?HGB 8.3* 8.1* 8.0*  ?HCT 29.8* 27.9* 28.1*  ?MCV 95.2 93.3 94.6  ?MCH 26.5 27.1 26.9  ?MCHC 27.9* 29.0* 28.5*  ?RDW 14.3 14.0 14.1  ?PLT 255 249 247  ? ? ?Cardiac Enzymes  ?Recent Labs  ?Lab 10/17/21 ?1915 10/17/21 ?2116  ?TROPONINIHS 15 13  ?   ? ?BNPBNP ?   ?Component Value Date/Time  ? BNP 691.7 (H) 10/17/2021 1916  ? ?Lipids  ?Lab Results  ?Component Value Date  ? CHOL 101 06/13/2021  ? HDL 40 (L) 06/13/2021  ? [place] 47 06/13/2021  ? TRIG 69 06/13/2021  ? CHOLHDL 2.5 06/13/2021  ? ? ?HbA1c  ?Lab Results  ?Component Value Date  ? HGBA1C 5.9 (H) 06/13/2021  ? ? ?Radiology  ?  ?DG Chest Portable 1 View ? ?Result Date: 10/17/2021 ?CLINICAL DATA:  Shortness of breath EXAM: PORTABLE CHEST 1 VIEW COMPARISON:  07/23/2021 FINDINGS: The heart size and mediastinal contours are within normal limits. Both lungs are clear. Old right-sided rib fracture. IMPRESSION: No active disease. Electronically Signed   By: Donavan Foil M.D.   On: 10/17/2021 19:39  ? ?Telemetry  ?  ?Afib, 90's to low 100's - Personally Reviewed ? ?Cardiac Studies  ? ?2D Echocardiogram 3.6.2023 ? ?1. Left ventricular ejection fraction, by  estimation, is 35 to 40%. The  ?left ventricle has moderately decreased function. Left ventricular  ?endocardial border not optimally defined to evaluate regional wall motion.  ?The left ventricular internal cavity  ?size was mildly to moderately dilated. There is moderate left ventricular  ?hypertrophy. Left ventricular diastolic function could not be evaluated.  ? 2. Right ventricular systolic function is normal. The right ventricular  ?size is mildly enlarged. Mildly increased right ventricular wall  ?thickness.  ? 3. Left atrial size was mildly dilated.  ? 4. Right atrial size was mildly dilated.  ? 5. The mitral valve was not well visualized. Mild mitral valve  ?regurgitation.  ? 6. The aortic valve is tricuspid. Aortic valve regurgitation is not  ?visualized. No aortic stenosis is present.  ? 7. The inferior vena cava is dilated in size with >50% respiratory  ?variability, suggesting right atrial pressure of 8 mmHg.  ? ?Patient Profile  ?   ?71 y.o. male history of nonischemic cardiomyopathy w/ EF 35 to 40% this admission, persistent atrial fibrillation, asthma/COPD, chronic respiratory failure, obesity, stage III-IV chronic kidney disease, anemia of chronic disease, obesity, and respiratory failure, presenting with shortness of breath and palpitations, being seen for A-fib RVR. ? ?Assessment & Plan  ?  ?1.  Afib w/ RVR: Longstanding and persistent over the past several months with prior hospitalization in December 2022 with anemia requiring brief hold of Eliquis.  He declined GI work-up at that time.  Readmitted with COPD exacerbation respiratory failure requiring BiPAP on this admission and heart rates again elevated.  He was initially placed on amiodarone however, compliance with Eliquis was unclear, and this was discontinued.  Though he previously did well with calcium channel blocker and beta-blocker therapy, home dose of calcium channel blocker discontinued in the setting of an EF of 35 to 40%, and soft  blood pressures have limited our ability to titrate metoprolol further-currently taking 25 mg twice daily whereas previous outpatient dose was 50 twice daily.  He remains anticoagulated with Eliquis with  low but stable H&H at 8.0/28.1.  Breathing improved but he notes not quite back to baseline.  He is euvolemic on examination.  In the setting of stage IV chronic kidney disease, he is a poor candidate for digoxin.  With limited options for management of A-fib and ongoing elevation of rates, pending recovery of respiratory failure, we might wish to consider TEE and cardioversion prior to discharge. ? ?2.  Acute on chronic systolic congestive heart failure/cardiomyopathy: EF 35 to 40% by echo this admission.  Presumed to be nonischemic, tachycardia induced.  Responded well to IV diuresis and was transitioned to oral Lasix this morning.  -580 mL yesterday and -3.6 L since admission.  Renal function worse this morning with a BUN of 47 and creatinine 3.14.  Bicarb up to 29.  Exam challenging but overall appears euvolemic.  GDMT limited by soft blood pressures.  Currently on short acting metoprolol 25 mg and I will change this to metoprolol succinate 25 mg twice daily.  If pressure stable, can transition to 50 mg once a day.  Continue p.o. Lasix and follow renal function closely.  As ongoing tachycardia is likely contributing to LV dysfunction, will consider TEE cardioversion early next week. ? ?3.  Acute on chronic respiratory failure/acute exacerbation of COPD: Breathing not quite at baseline though not actively wheezing.  Nebulizers per medicine team. ? ?4.  Stage IV chronic kidney disease: Creatinine higher today.  Transition to Lasix 40 mg p.o. twice daily.  Follow. ? ?5.  Normocytic anemia: H&H relatively stable at 8.0 and 28.1. ? ?Signed, ?Murray Hodgkins, NP  ?10/21/2021, 1:22 PM   ? ?For questions or updates, please contact   ?Please consult [email or web address] for contact info under Cardiology/STEMI.  ?

## 2021-10-21 NOTE — Progress Notes (Signed)
?  ?  Durable Medical Equipment  ?(From admission, onward)  ?  ? ? ?  ? ?  Start     Ordered  ? 10/21/21 1459  For home use only DME 3 n 1  Once       ?Comments: Bariatric 3N1  ? 10/21/21 1458  ? 10/21/21 1458  For home use only DME Hospital bed  Once       ?Question Answer Comment  ?Length of Need 12 Months   ?Patient has (list medical condition): generalized weakness, obesity, CKD   ?Bed type Semi-electric   ?  ? 10/21/21 1458  ? ?  ?  ? ?  ? ?[place], [place] ?[phone] ? ?

## 2021-10-21 NOTE — Progress Notes (Signed)
Physical Therapy Treatment ?Patient Details ?Name: Matthew [name] ?MRN: [id number] ?DOB: April 06, 1951 ?Today's Date: 10/21/2021 ? ? ?History of Present Illness Per MD Notes: Matthew [name] is a 71 y.o. male with medical history significant for COPD, OSA not on CPAP, prior home O2 use, class IV obesity, CKD stage IV, HTN, A-fib on Eliquis, diastolic CHF with last exacerbation in October 2022, who presents to the ED by EMS with respiratory distress, chest pain, arriving on CPAP. ? ?  ?PT Comments  ? ? Pt resting in bed upon PT arrival; pt agreeable to PT session.  SBA semi-supine to sitting edge of bed; good sitting balance.  With 1 assist, pt unable to stand x4 trials up to RW with bed height elevated (increasing bed height each attempt but even with significantly elevated bed height, unable to stand with 1 assist).  Min assist for B LE's sit to semi-supine in bed.  Pt demonstrating SOB with activity (O2 sats 94% or greater on 1 L O2 via nasal cannula during sessions activities) requiring sitting rest breaks and pacing.  HR 111 bpm at rest to briefly 142 bpm with activity and BP 122/105 in sitting when pt c/o mild lightheadedness (nurse notified).  Will continue to focus on strengthening and progressive functional mobility per pt tolerance. ?  ?Recommendations for follow up therapy are one component of a multi-disciplinary discharge planning process, led by the attending physician.  Recommendations may be updated based on patient status, additional functional criteria and insurance authorization. ? ?Follow Up Recommendations ? Skilled nursing-short term rehab (<3 hours/day) ?  ?  ?Assistance Recommended at Discharge Frequent or constant Supervision/Assistance  ?Patient can return home with the following Two people to help with walking and/or transfers;Assistance with cooking/housework;Help with stairs or ramp for entrance;Two people to help with bathing/dressing/bathroom;Assist for transportation ?  ?Equipment  Recommendations ? BSC/3in1;Rolling walker (2 wheels);Wheelchair (measurements PT);Wheelchair cushion (measurements PT);Hospital bed (hoyer lift)  ?  ?Recommendations for Other Services   ? ? ?  ?Precautions / Restrictions Precautions ?Precautions: Fall ?Restrictions ?Weight Bearing Restrictions: No  ?  ? ?Mobility ? Bed Mobility ?Overal bed mobility: Needs Assistance ?Bed Mobility: Supine to Sit, Sit to Supine ?  ?  ?Supine to sit: Supervision, HOB elevated ?Sit to supine: Min assist (assist for B LE's) ?  ?General bed mobility comments: vc's for technique ?  ? ?Transfers ?Overall transfer level: Needs assistance ?Equipment used: Rolling walker (2 wheels) ?Transfers: Sit to/from Stand ?Sit to Stand: From elevated surface, Total assist ?  ?  ?  ?  ?  ?General transfer comment: unable to stand x4 trials up to RW with bed height elevated (increasing bed height each attempt but even with significantly elevated bed height, unable to stand with 1 assist) ?  ? ?Ambulation/Gait ?Ambulation/Gait assistance:  (unable to stand up to RW to attempt) ?  ?  ?  ?  ?  ?  ?  ? ? ?Stairs ?  ?  ?  ?  ?  ? ? ?Wheelchair Mobility ?  ? ?Modified Rankin (Stroke Patients Only) ?  ? ? ?  ?Balance Overall balance assessment: Needs assistance ?Sitting-balance support: No upper extremity supported, Feet supported ?Sitting balance-Leahy Scale: Good ?Sitting balance - Comments: steady sitting reaching within BOS ?  ?  ?  ?  ?  ?  ?  ?  ?  ?  ?  ?  ?  ?  ?  ?  ? ?  ?Cognition Arousal/Alertness: Awake/alert ?Behavior During  Therapy: WFL for tasks assessed/performed ?Overall Cognitive Status: Within Functional Limits for tasks assessed ?  ?  ?  ?  ?  ?  ?  ?  ?  ?  ?  ?  ?  ?  ?  ?  ?  ?  ?  ? ?  ?Exercises   ? ?  ?General Comments  Nursing cleared pt for participation in physical therapy.  Pt agreeable to PT session. ?  ?  ? ?Pertinent Vitals/Pain Pain Assessment ?Pain Assessment: No/denies pain ?Pain Intervention(s): Limited activity within  patient's tolerance, Monitored during session, Repositioned  ? ? ?Home Living   ?  ?  ?  ?  ?  ?  ?  ?  ?  ?   ?  ?Prior Function    ?  ?  ?   ? ?PT Goals (current goals can now be found in the care plan section) Acute Rehab PT Goals ?Patient Stated Goal: to go home ?PT Goal Formulation: With patient ?Time For Goal Achievement: 11/02/21 ?Potential to Achieve Goals: Fair ?Additional Goals ?Additional Goal #1: Pt will be able to perform bed mobility/transfers with supervision and safe technique in order to improve functional independence ?Progress towards PT goals: Not progressing toward goals - comment (unable to stand with 1 assist) ? ?  ?Frequency ? ? ? Min 2X/week ? ? ? ?  ?PT Plan Equipment recommendations need to be updated  ? ? ?Co-evaluation   ?  ?  ?  ?  ? ?  ?AM-PAC PT "6 Clicks" Mobility   ?Outcome Measure ? Help needed turning from your back to your side while in a flat bed without using bedrails?: A Little ?Help needed moving from lying on your back to sitting on the side of a flat bed without using bedrails?: A Little ?Help needed moving to and from a bed to a chair (including a wheelchair)?: Total ?Help needed standing up from a chair using your arms (e.g., wheelchair or bedside chair)?: Total ?Help needed to walk in hospital room?: Total ?Help needed climbing 3-5 steps with a railing? : Total ?6 Click Score: 10 ? ?  ?End of Session Equipment Utilized During Treatment: Gait belt;Oxygen (1 L via nasal cannula) ?Activity Tolerance: Patient limited by fatigue ?Patient left: in bed;with call bell/phone within reach;with bed alarm set ?Nurse Communication: Mobility status;Precautions;Other (comment) (pt's HR and SOB during session; open sore on pt's back (pt reports chronic for past year)) ?PT Visit Diagnosis: Difficulty in walking, not elsewhere classified (R26.2);Muscle weakness (generalized) (M62.81);Unsteadiness on feet (R26.81) ?  ? ? ?Time: 0932-3557 ?PT Time Calculation (min) (ACUTE ONLY): 33  min ? ?Charges:  $Therapeutic Activity: 23-37 mins          ?          ? ?Leitha Bleak, PT ?10/21/21, 5:37 PM ? ? ?

## 2021-10-21 NOTE — TOC Progression Note (Addendum)
Transition of Care (TOC) - Progression Note  ? ? ?Patient Details  ?Name: MANRAJ YEO ?MRN: [id number] ?Date of Birth: 06-27-1951 ? ?Transition of Care (TOC) CM/SW Contact  ?Alberteen Sam, LCSW ?Phone Number: ?10/21/2021, 12:16 PM ? ?Clinical Narrative:    ? ?Update: Patient's spouse reports they plan on going home with [facility], will need hospital bed and bariatric 3in1. Ordered via [employer].  ? ?Lvm with spouse to inform of decline from [facility] due to outstanding balance and only bed offer from  [facility] Dispo [place] vs home with hh, pending call back for dispo decision.  ? ? ? ?Expected Discharge Plan: Home/Self Care ?Barriers to Discharge: Continued Medical Work up ? ?Expected Discharge Plan and Services ?Expected Discharge Plan: Home/Self Care ?  ?Discharge Planning Services: CM Consult ?  ?Living arrangements for the past 2 months: [place] ?                ?DME Arranged: N/A ?DME Agency: NA ?  ?  ?  ?  ?  ?  ?  ?  ? ? ?Social Determinants of Health (SDOH) Interventions ?  ? ?Readmission Risk Interventions ?Readmission Risk Prevention Plan 10/18/2021  ?Transportation Screening Complete  ?Medication Review Press photographer) Complete  ?PCP or Specialist appointment within 3-5 days of discharge Complete  ?[place] or Home Care Consult Complete  ?SW Recovery Care/Counseling Consult Complete  ?Palliative Care Screening Not Applicable  ?[place] Not Applicable  ?Some recent data might be hidden  ? ? ?

## 2021-10-21 NOTE — Progress Notes (Addendum)
PROGRESS NOTE    Matthew [name]  VOJ:[id number] DOB: 1951/06/15 DOA: 10/17/2021 PCP: [name], MD  Assessment & Plan:   Principal Problem:   Rapid atrial fibrillation ([place]) Active Problems:   Acute on chronic respiratory failure with hypoxia (HCC)   Acute on chronic diastolic CHF (congestive heart failure) (HCC)   Hypertension   COPD (chronic obstructive pulmonary disease) (HCC)   CKD (chronic kidney disease) stage 4, GFR 15-29 ml/min (HCC)   Obesity, Class III, BMI 40-49.9 (morbid obesity) (HCC)   OSA (obstructive sleep apnea)   NICM (nonischemic cardiomyopathy) (HCC)   PAF: w/ RVR & has progressed to persistent as per cardio. Likely secondary to medication non-compliance. Continue on metoprolol, eliquis   Unlikely bacteremia: 1/4 with s epi, likely contaminant. Repeat blood cxs NGTD   Acute on chronic systolic CHF:  continue on lasix. Monitor I/Os   Acute hypoxic respiratory failure: 88% on RA documented in ED. Weaned off of BiPAP. Continue on supplemental oxygen and wean as tolerated    HTN: continue on BB    COPD: w/o exacerbation. Continue on bronchodilators & encourage incentive spirometry    AKI on CKDIV: Cr is trending up daily. Nephro consulted    OSA: not on CPAP at home   BPH: continue on home dose of flomax   Depression: severity unknown. Continue on home dose of mirtazapine, duloxetine    Gout: not recently taking allopurinol    ACD: likely secondary to CKD. H&H are labile. No need for a transfusion currently   Obesity: BMI 35.9. Complicates overall care & prognosis     DVT prophylaxis: eliquis  Code Status: full Family Communication:  Disposition Plan: possible to d/c SNF vs home w/ HH   Level of care: Progressive  Status is: Inpatient Remains inpatient appropriate because: Cr is rising daily, nephro consulted. Will likely need SNF placement   Consultants:  Cardio   Procedures:  Antimicrobials:   Subjective: Pt c/o malaise    Objective: Vitals:   10/21/21 0314 10/21/21 0440 10/21/21 0441 10/21/21 0734  BP: 96/74 108/76  (!) 169/119  Pulse: 85 82  89  Resp: 18 16  (!) 21  Temp: 97.7 F (36.5 C) 98 F (36.7 C)  (!) 97.5 F (36.4 C)  TempSrc:  Oral    SpO2: 99% 100%  96%  Weight:   117.9 kg   Height:        Intake/Output Summary (Last 24 hours) at 10/21/2021 0747 Last data filed at 10/21/2021 0315 Gross per 24 hour  Intake 720 ml  Output 1300 ml  Net -580 ml   Filed Weights   10/19/21 0500 10/20/21 0435 10/21/21 0441  Weight: 127.1 kg 115.2 kg 117.9 kg    Examination:  General exam: Appears calm & comfortable. Obese Respiratory system: diminished breath sounds b/l  Cardiovascular system: irregularly irregular Gastrointestinal system: Abd is soft, NT, obese & hypoactive bowel sounds Central nervous system: Alert and oriented. Moves all extremities  Psychiatry: Judgement and insight appears normal. Flat mood and affect    Data Reviewed: I have personally reviewed following labs and imaging studies  CBC: Recent Labs  Lab 10/17/21 1915 10/19/21 0707 10/20/21 0545 10/21/21 0607  WBC 8.0 5.2 4.6 3.6*  NEUTROABS 5.8  --   --   --   HGB 10.1* 8.3* 8.1* 8.0*  HCT 36.6* 29.8* 27.9* 28.1*  MCV 97.1 95.2 93.3 94.6  PLT 333 255 249 993   Basic Metabolic Panel: Recent Labs  Lab 10/17/21 1915  10/18/21 1004 10/19/21 0707 10/20/21 0545 10/21/21 0607  NA 139 138 137 136 135  K 4.4 4.2 4.2 3.9 4.3  CL 106 107 104 100 102  CO2 '23 24 25 25 29  '$ GLUCOSE 120* 108* 121* 112* 99  BUN 30* 31* 34* 40* 47*  CREATININE 2.45* 2.48* 2.79* 2.96* 3.14*  CALCIUM 9.7 8.9 9.3 9.4 8.9   GFR: Estimated Creatinine Clearance: 29.5 mL/min (A) (by C-G formula based on SCr of 3.14 mg/dL (H)). Liver Function Tests: Recent Labs  Lab 10/17/21 1915  AST 12*  ALT 8  ALKPHOS 58  BILITOT 0.6  PROT 7.3  ALBUMIN 2.4*   No results for input(s): LIPASE, AMYLASE in the last 168 hours. No results for input(s):  AMMONIA in the last 168 hours. Coagulation Profile: No results for input(s): INR, PROTIME in the last 168 hours. Cardiac Enzymes: No results for input(s): CKTOTAL, CKMB, CKMBINDEX, TROPONINI in the last 168 hours. BNP (last 3 results) No results for input(s): PROBNP in the last 8760 hours. HbA1C: No results for input(s): HGBA1C in the last 72 hours. CBG: Recent Labs  Lab 10/19/21 1647  GLUCAP 120*   Lipid Profile: No results for input(s): CHOL, HDL, LDLCALC, TRIG, CHOLHDL, LDLDIRECT in the last 72 hours. Thyroid Function Tests: No results for input(s): TSH, T4TOTAL, FREET4, T3FREE, THYROIDAB in the last 72 hours. Anemia Panel: No results for input(s): VITAMINB12, FOLATE, FERRITIN, TIBC, IRON, RETICCTPCT in the last 72 hours. Sepsis Labs: Recent Labs  Lab 10/17/21 1915 10/17/21 2116  PROCALCITON 5.36  --   LATICACIDVEN 1.4 0.9    Recent Results (from the past 240 hour(s))  Resp Panel by RT-PCR (Flu A&B, Covid) Nasopharyngeal Swab     Status: None   Collection Time: 10/17/21  7:16 PM   Specimen: Nasopharyngeal Swab; Nasopharyngeal(NP) swabs in vial transport medium  Result Value Ref Range Status   SARS Coronavirus 2 by RT PCR NEGATIVE NEGATIVE Final    Comment: (NOTE) SARS-CoV-2 target nucleic acids are NOT DETECTED.  The SARS-CoV-2 RNA is generally detectable in upper respiratory specimens during the acute phase of infection. The lowest concentration of SARS-CoV-2 viral copies this assay can detect is 138 copies/mL. A negative result does not preclude SARS-Cov-2 infection and should not be used as the sole basis for treatment or other patient management decisions. A negative result may occur with  improper specimen collection/handling, submission of specimen other than nasopharyngeal swab, presence of viral mutation(s) within the areas targeted by this assay, and inadequate number of viral copies(<138 copies/mL). A negative result must be combined with clinical  observations, patient history, and epidemiological information. The expected result is Negative.  Fact Sheet for Patients:  EntrepreneurPulse.com.au  Fact Sheet for Healthcare Providers:  IncredibleEmployment.be  This test is no t yet approved or cleared by the [place] FDA and  has been authorized for detection and/or diagnosis of SARS-CoV-2 by FDA under an Emergency Use Authorization (EUA). This EUA will remain  in effect (meaning this test can be used) for the duration of the COVID-19 declaration under Section 564(b)(1) of the Act, 21 U.S.C.section 360bbb-3(b)(1), unless the authorization is terminated  or revoked sooner.       Influenza A by PCR NEGATIVE NEGATIVE Final   Influenza B by PCR NEGATIVE NEGATIVE Final    Comment: (NOTE) The Xpert Xpress SARS-CoV-2/FLU/RSV plus assay is intended as an aid in the diagnosis of influenza from Nasopharyngeal swab specimens and should not be used as a sole basis for treatment. Nasal  washings and aspirates are unacceptable for Xpert Xpress SARS-CoV-2/FLU/RSV testing.  Fact Sheet for Patients: EntrepreneurPulse.com.au  Fact Sheet for Healthcare Providers: IncredibleEmployment.be  This test is not yet approved or cleared by the [place] FDA and has been authorized for detection and/or diagnosis of SARS-CoV-2 by FDA under an Emergency Use Authorization (EUA). This EUA will remain in effect (meaning this test can be used) for the duration of the COVID-19 declaration under Section 564(b)(1) of the Act, 21 U.S.C. section 360bbb-3(b)(1), unless the authorization is terminated or revoked.  Performed at [facility], [address] [place], [place] [address]   Blood culture (routine x 2)     Status: Abnormal   Collection Time: 10/17/21  7:16 PM   Specimen: BLOOD  Result Value Ref Range Status   Specimen Description   Final    BLOOD LEFT  WRIST Performed at [facility], [address] [place], [place] [address]    Special Requests   Final    BOTTLES DRAWN AEROBIC AND ANAEROBIC Blood Culture adequate volume Performed at [facility] [address] [place], [place] [address]    Culture  Setup Time   Final    GRAM POSITIVE COCCI IN BOTH AEROBIC AND ANAEROBIC BOTTLES CRITICAL RESULT CALLED TO, READ BACK BY AND VERIFIED WITH: Lonie Peak PHARMD 1422 10/18/21 HNM    Culture (A)  Final    STAPHYLOCOCCUS EPIDERMIDIS THE SIGNIFICANCE OF ISOLATING THIS ORGANISM FROM A SINGLE SET OF BLOOD CULTURES WHEN MULTIPLE SETS ARE DRAWN IS UNCERTAIN. PLEASE NOTIFY THE MICROBIOLOGY DEPARTMENT WITHIN ONE WEEK IF SPECIATION AND SENSITIVITIES ARE REQUIRED. Performed at [facility], [place] [address] [place], [place] [address]    Report Status 10/21/2021 FINAL  Final  Blood culture (routine x 2)     Status: None (Preliminary result)   Collection Time: 10/17/21  7:16 PM   Specimen: BLOOD  Result Value Ref Range Status   Specimen Description BLOOD RIGHT ARM  Final   Special Requests   Final    BOTTLES DRAWN AEROBIC AND ANAEROBIC Blood Culture results may not be optimal due to an inadequate volume of blood received in culture bottles   Culture   Final    NO GROWTH 4 DAYS Performed at [facility], [address] [place], [place] [address]    Report Status PENDING  Incomplete  Blood Culture ID Panel (Reflexed)     Status: Abnormal   Collection Time: 10/17/21  7:16 PM  Result Value Ref Range Status   Enterococcus faecalis NOT DETECTED NOT DETECTED Final   Enterococcus Faecium NOT DETECTED NOT DETECTED Final   Listeria monocytogenes NOT DETECTED NOT DETECTED Final   Staphylococcus species DETECTED (A) NOT DETECTED Final    Comment: CRITICAL RESULT CALLED TO, READ BACK BY AND VERIFIED WITH: Lonie Peak PHARMD 1422 10/18/21 HNM    Staphylococcus aureus (BCID) NOT DETECTED NOT DETECTED Final    Staphylococcus epidermidis DETECTED (A) NOT DETECTED Final    Comment: CRITICAL RESULT CALLED TO, READ BACK BY AND VERIFIED WITH: Lonie Peak PHARMD 1422 10/18/21 HNM    Staphylococcus lugdunensis NOT DETECTED NOT DETECTED Final   Streptococcus species NOT DETECTED NOT DETECTED Final   Streptococcus agalactiae NOT DETECTED NOT DETECTED Final   Streptococcus pneumoniae NOT DETECTED NOT DETECTED Final   Streptococcus pyogenes NOT DETECTED NOT DETECTED Final   A.calcoaceticus-baumannii NOT DETECTED NOT DETECTED Final   Bacteroides fragilis NOT DETECTED NOT DETECTED Final   Enterobacterales NOT DETECTED NOT DETECTED Final  Enterobacter cloacae complex NOT DETECTED NOT DETECTED Final   Escherichia coli NOT DETECTED NOT DETECTED Final   Klebsiella aerogenes NOT DETECTED NOT DETECTED Final   Klebsiella oxytoca NOT DETECTED NOT DETECTED Final   Klebsiella pneumoniae NOT DETECTED NOT DETECTED Final   Proteus species NOT DETECTED NOT DETECTED Final   Salmonella species NOT DETECTED NOT DETECTED Final   Serratia marcescens NOT DETECTED NOT DETECTED Final   Haemophilus influenzae NOT DETECTED NOT DETECTED Final   Neisseria meningitidis NOT DETECTED NOT DETECTED Final   Pseudomonas aeruginosa NOT DETECTED NOT DETECTED Final   Stenotrophomonas maltophilia NOT DETECTED NOT DETECTED Final   Candida albicans NOT DETECTED NOT DETECTED Final   Candida auris NOT DETECTED NOT DETECTED Final   Candida glabrata NOT DETECTED NOT DETECTED Final   Candida krusei NOT DETECTED NOT DETECTED Final   Candida parapsilosis NOT DETECTED NOT DETECTED Final   Candida tropicalis NOT DETECTED NOT DETECTED Final   Cryptococcus neoformans/gattii NOT DETECTED NOT DETECTED Final   Methicillin resistance mecA/C NOT DETECTED NOT DETECTED Final    Comment: Performed at [facility], [address], [place] [address]  CULTURE, BLOOD (ROUTINE X 2) w Reflex to ID Panel     Status: None (Preliminary result)    Collection Time: 10/18/21  7:05 PM   Specimen: BLOOD  Result Value Ref Range Status   Specimen Description BLOOD [facility]  Final   Special Requests BOTTLES DRAWN AEROBIC AND ANAEROBIC BCAV  Final   Culture   Final    NO GROWTH 3 DAYS Performed at [facility], [address] [place], [place] [address]    Report Status PENDING  Incomplete  CULTURE, BLOOD (ROUTINE X 2) w Reflex to ID Panel     Status: None (Preliminary result)   Collection Time: 10/18/21  7:05 PM   Specimen: BLOOD  Result Value Ref Range Status   Specimen Description BLOOD LAC  Final   Special Requests BOTTLES DRAWN AEROBIC AND ANAEROBIC BCAV  Final   Culture   Final    NO GROWTH 3 DAYS Performed at [facility], [address], [place], [place] [address]    Report Status PENDING  Incomplete         Radiology Studies: No results found.      Scheduled Meds:  apixaban  5 mg Oral BID   DULoxetine  60 mg Oral BID   furosemide  20 mg Oral Daily   levalbuterol  0.63 mg Nebulization TID   melatonin  5 mg Oral QHS   metoprolol tartrate  25 mg Oral BID   mirtazapine  15 mg Oral QHS   mometasone-formoterol  2 puff Inhalation BID   multivitamin with minerals  1 tablet Oral Daily   tamsulosin  0.4 mg Oral QPC supper   Continuous Infusions:     LOS: 4 days    Time spent: 15 mins     Wyvonnia Dusky, MD [facility] Pager [phone]  If 7PM-7AM, please contact night-coverage 10/21/2021, 7:47 AM

## 2021-10-22 DIAGNOSIS — I5033 Acute on chronic diastolic (congestive) heart failure: Secondary | ICD-10-CM

## 2021-10-22 DIAGNOSIS — I5023 Acute on chronic systolic (congestive) heart failure: Secondary | ICD-10-CM | POA: Diagnosis not present

## 2021-10-22 DIAGNOSIS — N184 Chronic kidney disease, stage 4 (severe): Secondary | ICD-10-CM | POA: Diagnosis not present

## 2021-10-22 DIAGNOSIS — I4891 Unspecified atrial fibrillation: Secondary | ICD-10-CM | POA: Diagnosis not present

## 2021-10-22 LAB — CBC
HCT: 28 % — ABNORMAL LOW (ref 39.0–52.0)
Hemoglobin: 8 g/dL — ABNORMAL LOW (ref 13.0–17.0)
MCH: 26.7 pg (ref 26.0–34.0)
MCHC: 28.6 g/dL — ABNORMAL LOW (ref 30.0–36.0)
MCV: 93.3 fL (ref 80.0–100.0)
Platelets: 267 10*3/uL (ref 150–400)
RBC: 3 MIL/uL — ABNORMAL LOW (ref 4.22–5.81)
RDW: 14.4 % (ref 11.5–15.5)
WBC: 4.3 10*3/uL (ref 4.0–10.5)
nRBC: 0 % (ref 0.0–0.2)

## 2021-10-22 LAB — BASIC METABOLIC PANEL
Anion gap: 7 (ref 5–15)
BUN: 53 mg/dL — ABNORMAL HIGH (ref 8–23)
CO2: 29 mmol/L (ref 22–32)
Calcium: 9 mg/dL (ref 8.9–10.3)
Chloride: 100 mmol/L (ref 98–111)
Creatinine, Ser: 2.97 mg/dL — ABNORMAL HIGH (ref 0.61–1.24)
GFR, Estimated: 22 mL/min — ABNORMAL LOW (ref >60)
Glucose, Bld: 122 mg/dL — ABNORMAL HIGH (ref 70–99)
Potassium: 4.5 mmol/L (ref 3.5–5.1)
Sodium: 136 mmol/L (ref 135–145)

## 2021-10-22 LAB — CULTURE, BLOOD (ROUTINE X 2): Culture: NO GROWTH

## 2021-10-22 MED ORDER — SODIUM CHLORIDE 0.9 % IV SOLN: INTRAVENOUS | Status: DC

## 2021-10-22 NOTE — Progress Notes (Signed)
[facility]  ROUNDING NOTE   Subjective:   Matthew [name] is a 71 year old male with past medical conditions including obesity, A-fib on Eliquis, diastolic heart failure, COPD with as needed O2 use, and chronic kidney disease stage IV.  Patient presents to the emergency department with complaints of shortness of breath.  Patient was admitted for Rapid atrial fibrillation ([place]) [I48.91] Atrial fibrillation with rapid ventricular response ([facility]) [I48.91] Acute respiratory failure with hypoxemia ([place]) [J96.01] Patient is known to our practice from previous admissions.  Patient was referred to our office for outpatient follow-up and has not yet scheduled an appointment.    Update today Patient resting in bed, in no acute distress. He denies SOB, nausea and vomiting. Appetite good. Renal function is improving. Urine output adequate.   Objective:  Vital signs in last 24 hours:  Temp:  [97.5 F (36.4 C)-98.8 F (37.1 C)] 97.7 F (36.5 C) (03/11 1128) Pulse Rate:  [69-124] 84 (03/11 1128) Resp:  [16-20] 18 (03/11 1128) BP: (90-123)/(59-85) 98/62 (03/11 1128) SpO2:  [96 %-100 %] 96 % (03/11 1128) FiO2 (%):  [97 %] 97 % (03/10 2054) Weight:  [125.4 kg] 125.4 kg (03/11 0410)  Weight change: 7.52 kg Filed Weights   10/20/21 0435 10/21/21 0441 10/22/21 0410  Weight: 115.2 kg 117.9 kg 125.4 kg    Intake/Output: I/O last 3 completed shifts: In: 51 [P.O.:960] Out: 3200 [Urine:3200]   Intake/Output this shift:  Total I/O In: 620 [P.O.:620] Out: 825 [Urine:825]  Physical Exam: General: Resting in bed comfortably  Head: Moist oral mucosal membranes  Eyes: Anicteric  Lungs:  Respirations even , unlabored, [place] standby-no 02, lungs clear  Heart: Irregular rate and rhythm  Abdomen:  Soft, nontender, obese  Extremities: No peripheral edema.  Neurologic: Awake, alert,oriented  Skin: No acute lesions or rashes       Basic Metabolic Panel: Recent Labs  Lab 10/18/21 1004  10/19/21 0707 10/20/21 0545 10/21/21 0607 10/22/21 0509  NA 138 137 136 135 136  K 4.2 4.2 3.9 4.3 4.5  CL 107 104 100 102 100  CO2 '24 25 25 29 29  '$ GLUCOSE 108* 121* 112* 99 122*  BUN 31* 34* 40* 47* 53*  CREATININE 2.48* 2.79* 2.96* 3.14* 2.97*  CALCIUM 8.9 9.3 9.4 8.9 9.0     Liver Function Tests: Recent Labs  Lab 10/17/21 1915  AST 12*  ALT 8  ALKPHOS 58  BILITOT 0.6  PROT 7.3  ALBUMIN 2.4*    No results for input(s): LIPASE, AMYLASE in the last 168 hours. No results for input(s): AMMONIA in the last 168 hours.  CBC: Recent Labs  Lab 10/17/21 1915 10/19/21 0707 10/20/21 0545 10/21/21 0607 10/22/21 0509  WBC 8.0 5.2 4.6 3.6* 4.3  NEUTROABS 5.8  --   --   --   --   HGB 10.1* 8.3* 8.1* 8.0* 8.0*  HCT 36.6* 29.8* 27.9* 28.1* 28.0*  MCV 97.1 95.2 93.3 94.6 93.3  PLT 333 255 249 247 267     Cardiac Enzymes: No results for input(s): CKTOTAL, CKMB, CKMBINDEX, TROPONINI in the last 168 hours.  BNP: Invalid input(s): POCBNP  CBG: Recent Labs  Lab 10/19/21 1647  GLUCAP 120*     Microbiology: Results for orders placed or performed during the hospital encounter of 10/17/21  Resp Panel by RT-PCR (Flu A&B, Covid) Nasopharyngeal Swab     Status: None   Collection Time: 10/17/21  7:16 PM   Specimen: Nasopharyngeal Swab; Nasopharyngeal(NP) swabs in vial transport  medium  Result Value Ref Range Status   SARS Coronavirus 2 by RT PCR NEGATIVE NEGATIVE Final    Comment: (NOTE) SARS-CoV-2 target nucleic acids are NOT DETECTED.  The SARS-CoV-2 RNA is generally detectable in upper respiratory specimens during the acute phase of infection. The lowest concentration of SARS-CoV-2 viral copies this assay can detect is 138 copies/mL. A negative result does not preclude SARS-Cov-2 infection and should not be used as the sole basis for treatment or other patient management decisions. A negative result may occur with  improper specimen collection/handling, submission  of specimen other than nasopharyngeal swab, presence of viral mutation(s) within the areas targeted by this assay, and inadequate number of viral copies(<138 copies/mL). A negative result must be combined with clinical observations, patient history, and epidemiological information. The expected result is Negative.  Fact Sheet for Patients:  EntrepreneurPulse.com.au  Fact Sheet for Healthcare Providers:  IncredibleEmployment.be  This test is no t yet approved or cleared by the [place] FDA and  has been authorized for detection and/or diagnosis of SARS-CoV-2 by FDA under an Emergency Use Authorization (EUA). This EUA will remain  in effect (meaning this test can be used) for the duration of the COVID-19 declaration under Section 564(b)(1) of the Act, 21 U.S.C.section 360bbb-3(b)(1), unless the authorization is terminated  or revoked sooner.       Influenza A by PCR NEGATIVE NEGATIVE Final   Influenza B by PCR NEGATIVE NEGATIVE Final    Comment: (NOTE) The Xpert Xpress SARS-CoV-2/FLU/RSV plus assay is intended as an aid in the diagnosis of influenza from Nasopharyngeal swab specimens and should not be used as a sole basis for treatment. Nasal washings and aspirates are unacceptable for Xpert Xpress SARS-CoV-2/FLU/RSV testing.  Fact Sheet for Patients: EntrepreneurPulse.com.au  Fact Sheet for Healthcare Providers: IncredibleEmployment.be  This test is not yet approved or cleared by the [place] FDA and has been authorized for detection and/or diagnosis of SARS-CoV-2 by FDA under an Emergency Use Authorization (EUA). This EUA will remain in effect (meaning this test can be used) for the duration of the COVID-19 declaration under Section 564(b)(1) of the Act, 21 U.S.C. section 360bbb-3(b)(1), unless the authorization is terminated or revoked.  Performed at [facility], [address] [place], [place] [address]   Blood culture (routine x 2)     Status: Abnormal   Collection Time: 10/17/21  7:16 PM   Specimen: BLOOD  Result Value Ref Range Status   Specimen Description   Final    BLOOD LEFT WRIST Performed at [facility], [address] [place], [place] [address]    Special Requests   Final    BOTTLES DRAWN AEROBIC AND ANAEROBIC Blood Culture adequate volume Performed at [facility], [address] [place], [place] [address]    Culture  Setup Time   Final    GRAM POSITIVE COCCI IN BOTH AEROBIC AND ANAEROBIC BOTTLES CRITICAL RESULT CALLED TO, READ BACK BY AND VERIFIED WITH: Lonie Peak PHARMD 1422 10/18/21 HNM    Culture (A)  Final    STAPHYLOCOCCUS EPIDERMIDIS THE SIGNIFICANCE OF ISOLATING THIS ORGANISM FROM A SINGLE SET OF BLOOD CULTURES WHEN MULTIPLE SETS ARE DRAWN IS UNCERTAIN. PLEASE NOTIFY THE MICROBIOLOGY DEPARTMENT WITHIN ONE WEEK IF SPECIATION AND SENSITIVITIES ARE REQUIRED. Performed at [facility], [place] [address] [place], [place] [address]    Report Status 10/21/2021 FINAL  Final  Blood culture (routine x 2)     Status: None   Collection  Time: 10/17/21  7:16 PM   Specimen: BLOOD  Result Value Ref Range Status   Specimen Description BLOOD RIGHT ARM  Final   Special Requests   Final    BOTTLES DRAWN AEROBIC AND ANAEROBIC Blood Culture results may not be optimal due to an inadequate volume of blood received in culture bottles   Culture   Final    NO GROWTH 5 DAYS Performed at [facility], [address] [place], [place] [address]    Report Status 10/22/2021 FINAL  Final  Blood Culture ID Panel (Reflexed)     Status: Abnormal   Collection Time: 10/17/21  7:16 PM  Result Value Ref Range Status   Enterococcus faecalis NOT DETECTED NOT DETECTED Final   Enterococcus Faecium NOT DETECTED NOT DETECTED Final   Listeria monocytogenes NOT DETECTED NOT DETECTED Final   Staphylococcus species DETECTED (A) NOT  DETECTED Final    Comment: CRITICAL RESULT CALLED TO, READ BACK BY AND VERIFIED WITH: Lonie Peak PHARMD 1422 10/18/21 HNM    Staphylococcus aureus (BCID) NOT DETECTED NOT DETECTED Final   Staphylococcus epidermidis DETECTED (A) NOT DETECTED Final    Comment: CRITICAL RESULT CALLED TO, READ BACK BY AND VERIFIED WITH: Lonie Peak UMPNTI 1443 10/18/21 HNM    Staphylococcus lugdunensis NOT DETECTED NOT DETECTED Final   Streptococcus species NOT DETECTED NOT DETECTED Final   Streptococcus agalactiae NOT DETECTED NOT DETECTED Final   Streptococcus pneumoniae NOT DETECTED NOT DETECTED Final   Streptococcus pyogenes NOT DETECTED NOT DETECTED Final   A.calcoaceticus-baumannii NOT DETECTED NOT DETECTED Final   Bacteroides fragilis NOT DETECTED NOT DETECTED Final   Enterobacterales NOT DETECTED NOT DETECTED Final   Enterobacter cloacae complex NOT DETECTED NOT DETECTED Final   Escherichia coli NOT DETECTED NOT DETECTED Final   Klebsiella aerogenes NOT DETECTED NOT DETECTED Final   Klebsiella oxytoca NOT DETECTED NOT DETECTED Final   Klebsiella pneumoniae NOT DETECTED NOT DETECTED Final   Proteus species NOT DETECTED NOT DETECTED Final   Salmonella species NOT DETECTED NOT DETECTED Final   Serratia marcescens NOT DETECTED NOT DETECTED Final   Haemophilus influenzae NOT DETECTED NOT DETECTED Final   Neisseria meningitidis NOT DETECTED NOT DETECTED Final   Pseudomonas aeruginosa NOT DETECTED NOT DETECTED Final   Stenotrophomonas maltophilia NOT DETECTED NOT DETECTED Final   Candida albicans NOT DETECTED NOT DETECTED Final   Candida auris NOT DETECTED NOT DETECTED Final   Candida glabrata NOT DETECTED NOT DETECTED Final   Candida krusei NOT DETECTED NOT DETECTED Final   Candida parapsilosis NOT DETECTED NOT DETECTED Final   Candida tropicalis NOT DETECTED NOT DETECTED Final   Cryptococcus neoformans/gattii NOT DETECTED NOT DETECTED Final   Methicillin resistance mecA/C NOT DETECTED NOT DETECTED  Final    Comment: Performed at [facility], [address] [place], [place] [address]  CULTURE, BLOOD (ROUTINE X 2) w Reflex to ID Panel     Status: None (Preliminary result)   Collection Time: 10/18/21  7:05 PM   Specimen: BLOOD  Result Value Ref Range Status   Specimen Description BLOOD [facility]  Final   Special Requests BOTTLES DRAWN AEROBIC AND ANAEROBIC BCAV  Final   Culture   Final    NO GROWTH 4 DAYS Performed at [facility], [address] [place], [place] [address]    Report Status PENDING  Incomplete  CULTURE, BLOOD (ROUTINE X 2) w Reflex to ID Panel     Status: None (Preliminary result)   Collection Time: 10/18/21  7:05  PM   Specimen: BLOOD  Result Value Ref Range Status   Specimen Description BLOOD [facility]  Final   Special Requests BOTTLES DRAWN AEROBIC AND ANAEROBIC BCAV  Final   Culture   Final    NO GROWTH 4 DAYS Performed at [facility], [address] [place], [place] [address]    Report Status PENDING  Incomplete    Coagulation Studies: No results for input(s): LABPROT, INR in the last 72 hours.  Urinalysis: No results for input(s): COLORURINE, LABSPEC, PHURINE, GLUCOSEU, HGBUR, BILIRUBINUR, KETONESUR, PROTEINUR, UROBILINOGEN, NITRITE, LEUKOCYTESUR in the last 72 hours.  Invalid input(s): APPERANCEUR    Imaging: No results found.   Medications:    sodium chloride     sodium chloride      amiodarone  200 mg Oral BID   apixaban  5 mg Oral BID   docusate sodium  200 mg Oral BID   DULoxetine  60 mg Oral BID   furosemide  20 mg Oral Daily   levalbuterol  0.63 mg Nebulization TID   melatonin  5 mg Oral QHS   metoprolol succinate  25 mg Oral BID   mirtazapine  15 mg Oral QHS   mometasone-formoterol  2 puff Inhalation BID   multivitamin with minerals  1 tablet Oral Daily   tamsulosin  0.4 mg Oral QPC supper   acetaminophen, ipratropium-albuterol, ondansetron (ZOFRAN) IV  Assessment/ Plan:  Mr. CONNAR KEATING is a 71 y.o.   male past medical conditions including obesity, A-fib on Eliquis, systolic heart failure, COPD with as needed O2 use, and chronic kidney disease stage IV.  Patient presents to the emergency department with complaints of shortness of breath.  Patient was admitted for Rapid atrial fibrillation ([place]) [I48.91] Atrial fibrillation with rapid ventricular response ([facility]) [I48.91] Acute respiratory failure with hypoxemia ([facility]) [J96.01]   Acute Kidney Injury on chronic kidney disease stage IV with baseline creatinine 2.35 and GFR of 27 on 10/17/21.  Acute kidney injury secondary to hypervolemic state and diuresis Chronic kidney disease is secondary to hypertension and chronic heart failure No IV contrast exposure   Lab Results  Component Value Date   CREATININE 2.97 (H) 10/22/2021   CREATININE 3.14 (H) 10/21/2021   CREATININE 2.96 (H) 10/20/2021    Intake/Output Summary (Last 24 hours) at 10/22/2021 1415 Last data filed at 10/22/2021 1124 Gross per 24 hour  Intake 1580 ml  Output 1725 ml  Net -145 ml   Renal function improving, urine output adequate No acute indication for dialysis Continue Furosemide 20 mg PO daily  2.  Hypotension with history of hypertension with chronic kidney disease. Home regimen includes furosemide, metoprolol, and diltiazem.  Also prescribed tamsulosin.   Receiving Furosemide and Metoprolol Succinate. Blood pressure 98/62 today  3.  Chronic systolic heart failure.  Echo from 10/18/2021 shows EF 35 to 40% with moderate LVH.  Currently receiving Furosemide 20 mg PO daily  4.  Atrial fibrillation with rapid RVR.  Heart rate in 80's today  Cardiology following.     LOS: 5 Jarred Purtee 3/11/20232:15 PM

## 2021-10-22 NOTE — Progress Notes (Signed)
PROGRESS NOTE    Matthew [name]  ELF:[id number] DOB: May 06, 1951 DOA: 10/17/2021 PCP: [name], MD  Assessment & Plan:   Principal Problem:   Rapid atrial fibrillation ([place]) Active Problems:   Acute on chronic respiratory failure with hypoxia (HCC)   Acute on chronic diastolic CHF (congestive heart failure) (HCC)   Hypertension   COPD (chronic obstructive pulmonary disease) (HCC)   CKD (chronic kidney disease) stage 4, GFR 15-29 ml/min (HCC)   Obesity, Class III, BMI 40-49.9 (morbid obesity) (HCC)   OSA (obstructive sleep apnea)   NICM (nonischemic cardiomyopathy) (HCC)   PAF: w/ RVR & has progressed to persistent as per cardio. Likely secondary to medication non-compliance. Continue on eliquis, metoprolol. Will go for cardioversion on 10/24/21 as per cardio    Unlikely bacteremia: 1/4 with s epi, likely contaminant. Repeat blood cxs NGTD   Acute on chronic systolic CHF: continue on lasix. Monitor I/Os  Acute hypoxic respiratory failure: 88% on RA documented in ED. Weaned off of BiPAP. Continue on supplemental oxygen and wean as tolerated    HTN: continue on metoprolol     COPD: w/o exacerbation. Continue on bronchodilators & encourage incentive spirometry    AKI on CKDIV: Cr is trending down slightly today. Nephro following and recs apprec   OSA: not on CPAP at home   BPH: continue on home dose of flomax    Depression: severity unknown. Continue on home dose of duloxetine, mirtazapine   Gout: not recently taking allopurinol    ACD: likely secondary to CKD. H&H are labile  Obesity: BMI 35.9. Complicates overall care & prognosis     DVT prophylaxis: eliquis  Code Status: full Family Communication: called pt's wife, [name], but no answer so I left a voicemail Disposition Plan: d/c home w/ HH   Level of care: Progressive  Status is: Inpatient Remains inpatient appropriate because: cardioversion on 10/24/21 as per cardio  Consultants:  Cardio    Procedures:  Antimicrobials:   Subjective: Pt denies any complaints   Objective: Vitals:   10/21/21 2128 10/21/21 2353 10/22/21 0410 10/22/21 0736  BP:  123/69 107/85 114/70  Pulse: (!) 113 (!) 119 94 72  Resp:  '18 18 19  '$ Temp:  98.8 F (37.1 C) 97.9 F (36.6 C) (!) 97.5 F (36.4 C)  TempSrc:  Oral Axillary   SpO2:  99% 100% 100%  Weight:   125.4 kg   Height:        Intake/Output Summary (Last 24 hours) at 10/22/2021 0741 Last data filed at 10/22/2021 0736 Gross per 24 hour  Intake 960 ml  Output 2175 ml  Net -1215 ml   Filed Weights   10/20/21 0435 10/21/21 0441 10/22/21 0410  Weight: 115.2 kg 117.9 kg 125.4 kg    Examination:  General exam: Appears comfortable  Respiratory system: decreased breath sounds b/l Cardiovascular system: irregularly irregular. No rubs or gallops  Gastrointestinal system: Abd is soft, NT, obese & normal bowel sounds  Central nervous system: alert and oriented. Moves all extremities  Psychiatry: Judgement and insight appears normal. Flat mood and affect    Data Reviewed: I have personally reviewed following labs and imaging studies  CBC: Recent Labs  Lab 10/17/21 1915 10/19/21 0707 10/20/21 0545 10/21/21 0607 10/22/21 0509  WBC 8.0 5.2 4.6 3.6* 4.3  NEUTROABS 5.8  --   --   --   --   HGB 10.1* 8.3* 8.1* 8.0* 8.0*  HCT 36.6* 29.8* 27.9* 28.1* 28.0*  MCV 97.1 95.2 93.3  94.6 93.3  PLT 333 255 249 247 937   Basic Metabolic Panel: Recent Labs  Lab 10/18/21 1004 10/19/21 0707 10/20/21 0545 10/21/21 0607 10/22/21 0509  NA 138 137 136 135 136  K 4.2 4.2 3.9 4.3 4.5  CL 107 104 100 102 100  CO2 '24 25 25 29 29  '$ GLUCOSE 108* 121* 112* 99 122*  BUN 31* 34* 40* 47* 53*  CREATININE 2.48* 2.79* 2.96* 3.14* 2.97*  CALCIUM 8.9 9.3 9.4 8.9 9.0   GFR: Estimated Creatinine Clearance: 32.1 mL/min (A) (by C-G formula based on SCr of 2.97 mg/dL (H)). Liver Function Tests: Recent Labs  Lab 10/17/21 1915  AST 12*  ALT 8   ALKPHOS 58  BILITOT 0.6  PROT 7.3  ALBUMIN 2.4*   No results for input(s): LIPASE, AMYLASE in the last 168 hours. No results for input(s): AMMONIA in the last 168 hours. Coagulation Profile: No results for input(s): INR, PROTIME in the last 168 hours. Cardiac Enzymes: No results for input(s): CKTOTAL, CKMB, CKMBINDEX, TROPONINI in the last 168 hours. BNP (last 3 results) No results for input(s): PROBNP in the last 8760 hours. HbA1C: No results for input(s): HGBA1C in the last 72 hours. CBG: Recent Labs  Lab 10/19/21 1647  GLUCAP 120*   Lipid Profile: No results for input(s): CHOL, HDL, LDLCALC, TRIG, CHOLHDL, LDLDIRECT in the last 72 hours. Thyroid Function Tests: No results for input(s): TSH, T4TOTAL, FREET4, T3FREE, THYROIDAB in the last 72 hours. Anemia Panel: No results for input(s): VITAMINB12, FOLATE, FERRITIN, TIBC, IRON, RETICCTPCT in the last 72 hours. Sepsis Labs: Recent Labs  Lab 10/17/21 1915 10/17/21 2116  PROCALCITON 5.36  --   LATICACIDVEN 1.4 0.9    Recent Results (from the past 240 hour(s))  Resp Panel by RT-PCR (Flu A&B, Covid) Nasopharyngeal Swab     Status: None   Collection Time: 10/17/21  7:16 PM   Specimen: Nasopharyngeal Swab; Nasopharyngeal(NP) swabs in vial transport medium  Result Value Ref Range Status   SARS Coronavirus 2 by RT PCR NEGATIVE NEGATIVE Final    Comment: (NOTE) SARS-CoV-2 target nucleic acids are NOT DETECTED.  The SARS-CoV-2 RNA is generally detectable in upper respiratory specimens during the acute phase of infection. The lowest concentration of SARS-CoV-2 viral copies this assay can detect is 138 copies/mL. A negative result does not preclude SARS-Cov-2 infection and should not be used as the sole basis for treatment or other patient management decisions. A negative result may occur with  improper specimen collection/handling, submission of specimen other than nasopharyngeal swab, presence of viral mutation(s) within  the areas targeted by this assay, and inadequate number of viral copies(<138 copies/mL). A negative result must be combined with clinical observations, patient history, and epidemiological information. The expected result is Negative.  Fact Sheet for Patients:  EntrepreneurPulse.com.au  Fact Sheet for Healthcare Providers:  IncredibleEmployment.be  This test is no t yet approved or cleared by the [place] FDA and  has been authorized for detection and/or diagnosis of SARS-CoV-2 by FDA under an Emergency Use Authorization (EUA). This EUA will remain  in effect (meaning this test can be used) for the duration of the COVID-19 declaration under Section 564(b)(1) of the Act, 21 U.S.C.section 360bbb-3(b)(1), unless the authorization is terminated  or revoked sooner.       Influenza A by PCR NEGATIVE NEGATIVE Final   Influenza B by PCR NEGATIVE NEGATIVE Final    Comment: (NOTE) The Xpert Xpress SARS-CoV-2/FLU/RSV plus assay is intended as an aid in  the diagnosis of influenza from Nasopharyngeal swab specimens and should not be used as a sole basis for treatment. Nasal washings and aspirates are unacceptable for Xpert Xpress SARS-CoV-2/FLU/RSV testing.  Fact Sheet for Patients: EntrepreneurPulse.com.au  Fact Sheet for Healthcare Providers: IncredibleEmployment.be  This test is not yet approved or cleared by the [place] FDA and has been authorized for detection and/or diagnosis of SARS-CoV-2 by FDA under an Emergency Use Authorization (EUA). This EUA will remain in effect (meaning this test can be used) for the duration of the COVID-19 declaration under Section 564(b)(1) of the Act, 21 U.S.C. section 360bbb-3(b)(1), unless the authorization is terminated or revoked.  Performed at [facility], [address] [place], [place] [address]   Blood culture (routine x 2)     Status: Abnormal    Collection Time: 10/17/21  7:16 PM   Specimen: BLOOD  Result Value Ref Range Status   Specimen Description   Final    BLOOD LEFT WRIST Performed at [facility], [address], [place], [place] [address]    Special Requests   Final    BOTTLES DRAWN AEROBIC AND ANAEROBIC Blood Culture adequate volume Performed at [facility], [address] [place], [place] [address]    Culture  Setup Time   Final    GRAM POSITIVE COCCI IN BOTH AEROBIC AND ANAEROBIC BOTTLES CRITICAL RESULT CALLED TO, READ BACK BY AND VERIFIED WITH: Lonie Peak PHARMD 1422 10/18/21 HNM    Culture (A)  Final    STAPHYLOCOCCUS EPIDERMIDIS THE SIGNIFICANCE OF ISOLATING THIS ORGANISM FROM A SINGLE SET OF BLOOD CULTURES WHEN MULTIPLE SETS ARE DRAWN IS UNCERTAIN. PLEASE NOTIFY THE MICROBIOLOGY DEPARTMENT WITHIN ONE WEEK IF SPECIATION AND SENSITIVITIES ARE REQUIRED. Performed at [facility], [address], [place], [place] [address]    Report Status 10/21/2021 FINAL  Final  Blood culture (routine x 2)     Status: None   Collection Time: 10/17/21  7:16 PM   Specimen: BLOOD  Result Value Ref Range Status   Specimen Description BLOOD RIGHT ARM  Final   Special Requests   Final    BOTTLES DRAWN AEROBIC AND ANAEROBIC Blood Culture results may not be optimal due to an inadequate volume of blood received in culture bottles   Culture   Final    NO GROWTH 5 DAYS Performed at [facility], [address] [place], [place] [address]    Report Status 10/22/2021 FINAL  Final  Blood Culture ID Panel (Reflexed)     Status: Abnormal   Collection Time: 10/17/21  7:16 PM  Result Value Ref Range Status   Enterococcus faecalis NOT DETECTED NOT DETECTED Final   Enterococcus Faecium NOT DETECTED NOT DETECTED Final   Listeria monocytogenes NOT DETECTED NOT DETECTED Final   Staphylococcus species DETECTED (A) NOT DETECTED Final    Comment: CRITICAL RESULT CALLED TO, READ BACK BY AND VERIFIED  WITH: Lonie Peak PHARMD 1422 10/18/21 HNM    Staphylococcus aureus (BCID) NOT DETECTED NOT DETECTED Final   Staphylococcus epidermidis DETECTED (A) NOT DETECTED Final    Comment: CRITICAL RESULT CALLED TO, READ BACK BY AND VERIFIED WITH: Lonie Peak PHARMD 1422 10/18/21 HNM    Staphylococcus lugdunensis NOT DETECTED NOT DETECTED Final   Streptococcus species NOT DETECTED NOT DETECTED Final   Streptococcus agalactiae NOT DETECTED NOT DETECTED Final   Streptococcus pneumoniae NOT DETECTED NOT DETECTED Final   Streptococcus pyogenes NOT DETECTED NOT DETECTED Final   A.calcoaceticus-baumannii NOT DETECTED NOT  DETECTED Final   Bacteroides fragilis NOT DETECTED NOT DETECTED Final   Enterobacterales NOT DETECTED NOT DETECTED Final   Enterobacter cloacae complex NOT DETECTED NOT DETECTED Final   Escherichia coli NOT DETECTED NOT DETECTED Final   Klebsiella aerogenes NOT DETECTED NOT DETECTED Final   Klebsiella oxytoca NOT DETECTED NOT DETECTED Final   Klebsiella pneumoniae NOT DETECTED NOT DETECTED Final   Proteus species NOT DETECTED NOT DETECTED Final   Salmonella species NOT DETECTED NOT DETECTED Final   Serratia marcescens NOT DETECTED NOT DETECTED Final   Haemophilus influenzae NOT DETECTED NOT DETECTED Final   Neisseria meningitidis NOT DETECTED NOT DETECTED Final   Pseudomonas aeruginosa NOT DETECTED NOT DETECTED Final   Stenotrophomonas maltophilia NOT DETECTED NOT DETECTED Final   Candida albicans NOT DETECTED NOT DETECTED Final   Candida auris NOT DETECTED NOT DETECTED Final   Candida glabrata NOT DETECTED NOT DETECTED Final   Candida krusei NOT DETECTED NOT DETECTED Final   Candida parapsilosis NOT DETECTED NOT DETECTED Final   Candida tropicalis NOT DETECTED NOT DETECTED Final   Cryptococcus neoformans/gattii NOT DETECTED NOT DETECTED Final   Methicillin resistance mecA/C NOT DETECTED NOT DETECTED Final    Comment: Performed at [facility], [address]  [place], [place] [address]  CULTURE, BLOOD (ROUTINE X 2) w Reflex to ID Panel     Status: None (Preliminary result)   Collection Time: 10/18/21  7:05 PM   Specimen: BLOOD  Result Value Ref Range Status   Specimen Description BLOOD [facility]  Final   Special Requests BOTTLES DRAWN AEROBIC AND ANAEROBIC BCAV  Final   Culture   Final    NO GROWTH 4 DAYS Performed at [facility], [address] [place], [place] [address]    Report Status PENDING  Incomplete  CULTURE, BLOOD (ROUTINE X 2) w Reflex to ID Panel     Status: None (Preliminary result)   Collection Time: 10/18/21  7:05 PM   Specimen: BLOOD  Result Value Ref Range Status   Specimen Description BLOOD LAC  Final   Special Requests BOTTLES DRAWN AEROBIC AND ANAEROBIC BCAV  Final   Culture   Final    NO GROWTH 4 DAYS Performed at [facility], [address], [place], [place] [address]    Report Status PENDING  Incomplete         Radiology Studies: No results found.      Scheduled Meds:  amiodarone  200 mg Oral BID   apixaban  5 mg Oral BID   docusate sodium  200 mg Oral BID   DULoxetine  60 mg Oral BID   furosemide  20 mg Oral Daily   levalbuterol  0.63 mg Nebulization TID   melatonin  5 mg Oral QHS   metoprolol succinate  25 mg Oral BID   mirtazapine  15 mg Oral QHS   mometasone-formoterol  2 puff Inhalation BID   multivitamin with minerals  1 tablet Oral Daily   tamsulosin  0.4 mg Oral QPC supper   Continuous Infusions:     LOS: 5 days    Time spent: 15 mins     Wyvonnia Dusky, MD [facility] Pager [phone]  If 7PM-7AM, please contact night-coverage 10/22/2021, 7:41 AM

## 2021-10-22 NOTE — Progress Notes (Signed)
? ?Cardiology Progress Note  ? ?Patient Name: Matthew [name] ?Date of Encounter: 10/22/2021 ? ?Primary Cardiologist: Nelva Bush, MD ? ?Subjective  ? ?Feels well this AM.  SOB w/ minimal activity in room, but otw feeling better.  No chest pain or palpitations. ? ?Inpatient Medications  ?  ?Scheduled Meds: ? amiodarone  200 mg Oral BID  ? apixaban  5 mg Oral BID  ? docusate sodium  200 mg Oral BID  ? DULoxetine  60 mg Oral BID  ? furosemide  20 mg Oral Daily  ? levalbuterol  0.63 mg Nebulization TID  ? melatonin  5 mg Oral QHS  ? metoprolol succinate  25 mg Oral BID  ? mirtazapine  15 mg Oral QHS  ? mometasone-formoterol  2 puff Inhalation BID  ? multivitamin with minerals  1 tablet Oral Daily  ? tamsulosin  0.4 mg Oral QPC supper  ? ?Continuous Infusions: ? ?PRN Meds: ?acetaminophen, ipratropium-albuterol, ondansetron (ZOFRAN) IV  ? ?Vital Signs  ?  ?Vitals:  ? 10/21/21 2353 10/22/21 0410 10/22/21 0736 10/22/21 0946  ?BP: 123/69 107/85 114/70   ?Pulse: (!) 119 94 72 88  ?Resp: '18 18 19 18  '$ ?Temp: 98.8 ?F (37.1 ?C) 97.9 ?F (36.6 ?C) (!) 97.5 ?F (36.4 ?C)   ?TempSrc: Oral Axillary    ?SpO2: 99% 100% 100% 96%  ?Weight:  125.4 kg    ?Height:      ? ? ?Intake/Output Summary (Last 24 hours) at 10/22/2021 1014 ?Last data filed at 10/22/2021 1006 ?Gross per 24 hour  ?Intake 1340 ml  ?Output 1925 ml  ?Net -585 ml  ? ?Filed Weights  ? 10/20/21 0435 10/21/21 0441 10/22/21 0410  ?Weight: 115.2 kg 117.9 kg 125.4 kg  ? ? ?Physical Exam  ? ?GEN: Obese, in no acute distress.  ?HEENT: Grossly normal.  ?Neck: Supple, obese, difficult to gauge JVP.  No carotid bruits, or masses. ?Cardiac: Ir, Ir, distant, no murmurs, rubs, or gallops. No clubbing, cyanosis, edema.  Radials 2+, DP/PT 2+ and equal bilaterally.  ?Respiratory:  Respirations regular and unlabored, clear to auscultation bilaterally. ?GI: Obese, soft, nontender, nondistended, BS + x 4. ?MS: no deformity or atrophy. ?Skin: warm and dry, no rash. ?Neuro:  Strength and  sensation are intact. ?Psych: AAOx3.  Normal affect. ? ?Labs  ?  ?Chemistry ?Recent Labs  ?Lab 10/17/21 ?1915 10/18/21 ?1004 10/20/21 ?3532 10/21/21 ?9924 10/22/21 ?2683  ?NA 139   < > 136 135 136  ?K 4.4   < > 3.9 4.3 4.5  ?CL 106   < > 100 102 100  ?CO2 23   < > '25 29 29  '$ ?GLUCOSE 120*   < > 112* 99 122*  ?BUN 30*   < > 40* 47* 53*  ?CREATININE 2.45*   < > 2.96* 3.14* 2.97*  ?CALCIUM 9.7   < > 9.4 8.9 9.0  ?PROT 7.3  --   --   --   --   ?ALBUMIN 2.4*  --   --   --   --   ?AST 12*  --   --   --   --   ?ALT 8  --   --   --   --   ?ALKPHOS 58  --   --   --   --   ?BILITOT 0.6  --   --   --   --   ?GFRNONAA 27*   < > 22* 20* 22*  ?ANIONGAP 10   < >  11 4* 7  ? < > = values in this interval not displayed.  ?  ? ?Hematology ?Recent Labs  ?Lab 10/20/21 ?2297 10/21/21 ?9892 10/22/21 ?1194  ?WBC 4.6 3.6* 4.3  ?RBC 2.99* 2.97* 3.00*  ?HGB 8.1* 8.0* 8.0*  ?HCT 27.9* 28.1* 28.0*  ?MCV 93.3 94.6 93.3  ?MCH 27.1 26.9 26.7  ?MCHC 29.0* 28.5* 28.6*  ?RDW 14.0 14.1 14.4  ?PLT 249 247 267  ? ? ?Cardiac Enzymes  ?Recent Labs  ?Lab 10/17/21 ?1915 10/17/21 ?2116  ?TROPONINIHS 15 13  ?   ? ?BNPBNP ?   ?Component Value Date/Time  ? BNP 691.7 (H) 10/17/2021 1916  ? ?Lipids  ?Lab Results  ?Component Value Date  ? CHOL 101 06/13/2021  ? HDL 40 (L) 06/13/2021  ? [place] 47 06/13/2021  ? TRIG 69 06/13/2021  ? CHOLHDL 2.5 06/13/2021  ? ? ?HbA1c  ?Lab Results  ?Component Value Date  ? HGBA1C 5.9 (H) 06/13/2021  ? ? ?Radiology  ?  ?------------------ ? ?Telemetry  ?  ?Afib, high 80's to low 100's - Personally Reviewed ? ?Cardiac Studies  ? ?2D Echocardiogram 3.6.2023 ?  ?1. Left ventricular ejection fraction, by estimation, is 35 to 40%. The  ?left ventricle has moderately decreased function. Left ventricular  ?endocardial border not optimally defined to evaluate regional wall motion.  ?The left ventricular internal cavity  ?size was mildly to moderately dilated. There is moderate left ventricular  ?hypertrophy. Left ventricular diastolic  function could not be evaluated.  ? 2. Right ventricular systolic function is normal. The right ventricular  ?size is mildly enlarged. Mildly increased right ventricular wall  ?thickness.  ? 3. Left atrial size was mildly dilated.  ? 4. Right atrial size was mildly dilated.  ? 5. The mitral valve was not well visualized. Mild mitral valve  ?regurgitation.  ? 6. The aortic valve is tricuspid. Aortic valve regurgitation is not  ?visualized. No aortic stenosis is present.  ? 7. The inferior vena cava is dilated in size with >50% respiratory  ?variability, suggesting right atrial pressure of 8 mmHg.  ? ?Patient Profile  ?   ?71 y.o. male history of nonischemic cardiomyopathy w/ EF 35 to 40% this admission, persistent atrial fibrillation, asthma/COPD, chronic respiratory failure, obesity, stage III-IV chronic kidney disease, anemia of chronic disease, obesity, and respiratory failure, presenting with shortness of breath and palpitations, being seen for A-fib RVR. ? ?Assessment & Plan  ?  ?1.  Atrial fibrillation with rapid ventricular response: Longstanding and persistent over the past several months with prior hospitalization December 2022, with anemia requiring brief hold of Eliquis.  He declined GI work-up at that time.  Readmitted with COPD exacerbation and respiratory failure requiring BiPAP on March 6.  Echo this admission with an EF of 35 to 40% and home dose of diltiazem discontinued.  Soft blood pressures have limited titration of oral beta-blocker therapy and amiodarone 200 mg was added on March 10.  Rates still mostly in the 90's to low 100's - no palpitations.  Otw clinically stable.  Plan for TEE/DCCV on 3/13 (discussed w/ specials staff on Friday - not currently listed on schedule but orders in place - will have to check w/ specials on Monday morning for timing). ? ?2.  Acute on chronic systolic congestive heart failure/cardiomyopathy: EF 35 to 40% by echo this admission.  Presumed to be  nonischemic/tachycardia induced.  Transition to oral Lasix on March 10.  Renal function stable with creatinine of 2.97.  -940 mL  yesterday.  I switched his metoprolol to tartrate to succinate-25 mg twice daily on March 10 and he seems to be tolerating this.  Exam challenging 2/2 body habitus, but does not appear to be significantly volume overloaded.  Lungs clear, no edema. Cont current meds.  Hopefully EF will improve following cardioversion. ? ?3.  Acute on chronic respiratory failure/acute exacerbation of COPD:  Lungs clear.  Per IM. ? ?4.  Stage IV chronic kidney disease: Creatinine improved with switch to oral Lasix.  Follow. ? ?5.  Normocytic anemia: H&H stable at 8.0/28.0. ? ?Signed, ?Murray Hodgkins, NP  ?10/22/2021, 10:14 AM   ? ?For questions or updates, please contact   ?Please consult [email or web address] for contact info under Cardiology/STEMI.  ?

## 2021-10-23 ENCOUNTER — Inpatient Hospital Stay: Payer: Medicare HMO

## 2021-10-23 DIAGNOSIS — N184 Chronic kidney disease, stage 4 (severe): Secondary | ICD-10-CM | POA: Diagnosis not present

## 2021-10-23 DIAGNOSIS — I4891 Unspecified atrial fibrillation: Secondary | ICD-10-CM | POA: Diagnosis not present

## 2021-10-23 DIAGNOSIS — J9621 Acute and chronic respiratory failure with hypoxia: Secondary | ICD-10-CM | POA: Diagnosis not present

## 2021-10-23 LAB — BASIC METABOLIC PANEL
Anion gap: 5 (ref 5–15)
BUN: 60 mg/dL — ABNORMAL HIGH (ref 8–23)
CO2: 30 mmol/L (ref 22–32)
Calcium: 9.1 mg/dL (ref 8.9–10.3)
Chloride: 102 mmol/L (ref 98–111)
Creatinine, Ser: 3.2 mg/dL — ABNORMAL HIGH (ref 0.61–1.24)
GFR, Estimated: 20 mL/min — ABNORMAL LOW (ref >60)
Glucose, Bld: 112 mg/dL — ABNORMAL HIGH (ref 70–99)
Potassium: 4.7 mmol/L (ref 3.5–5.1)
Sodium: 137 mmol/L (ref 135–145)

## 2021-10-23 LAB — CBC
HCT: 28.3 % — ABNORMAL LOW (ref 39.0–52.0)
Hemoglobin: 8.1 g/dL — ABNORMAL LOW (ref 13.0–17.0)
MCH: 26.9 pg (ref 26.0–34.0)
MCHC: 28.6 g/dL — ABNORMAL LOW (ref 30.0–36.0)
MCV: 94 fL (ref 80.0–100.0)
Platelets: 261 10*3/uL (ref 150–400)
RBC: 3.01 MIL/uL — ABNORMAL LOW (ref 4.22–5.81)
RDW: 14.6 % (ref 11.5–15.5)
WBC: 4.3 10*3/uL (ref 4.0–10.5)
nRBC: 0 % (ref 0.0–0.2)

## 2021-10-23 LAB — CULTURE, BLOOD (ROUTINE X 2)
Culture: NO GROWTH
Culture: NO GROWTH

## 2021-10-23 MED ORDER — TRAMADOL HCL 50 MG PO TABS
50.0000 mg | ORAL_TABLET | Freq: Four times a day (QID) | ORAL | Status: DC | PRN
  Administered 2021-10-23 – 2021-10-29 (×3): 50 mg via ORAL
  Filled 2021-10-23 (×3): qty 1

## 2021-10-23 MED ORDER — LEVALBUTEROL HCL 0.63 MG/3ML IN NEBU
0.6300 mg | INHALATION_SOLUTION | Freq: Four times a day (QID) | RESPIRATORY_TRACT | Status: DC | PRN
  Administered 2021-10-29: 0.63 mg via RESPIRATORY_TRACT
  Filled 2021-10-23: qty 3

## 2021-10-23 NOTE — Progress Notes (Signed)
Assumed care of pt at 1900. A&O x4. No c/o pain overnight. CPAP on at this time. HR 100-120s and SBP 100s. Medication administration per MAR. Full assessment per flowsheets. Updated on POC, however, requiring frequent reinforcement w/ education.  Refusing linen change or to be washed up this evening. Call bell within reach. Comfort and safety maintained.  ?

## 2021-10-23 NOTE — Progress Notes (Signed)
Patient was nauseated/vomiting. Prn iv zofran '4mg'$  given, which helped. However now c/o pain above his belly button that radiates upward. Dr. Jimmye Norman was made aware, md will place orders for abdominal xray. Will continue to monitor  ?

## 2021-10-23 NOTE — Progress Notes (Signed)
[facility]  ROUNDING NOTE   Subjective:   Matthew [name] is a 71 year old male with past medical conditions including obesity, A-fib on Eliquis, diastolic heart failure, COPD with as needed O2 use, and chronic kidney disease stage IV.  Patient presents to the emergency department with complaints of shortness of breath.  Patient was admitted for Rapid atrial fibrillation ([place]) [I48.91] Atrial fibrillation with rapid ventricular response (HCC) [I48.91] Acute respiratory failure with hypoxemia ([place]) [J96.01] Patient is known to our practice from previous admissions.  Patient was referred to our office for outpatient follow-up and has not yet scheduled an appointment.    Daily update: Renal function slightly worse today with creatinine up to 3.2 as compared to 2.97 the day before. Patient resting comfortably in bed.    Objective:  Vital signs in last 24 hours:  Temp:  [97.1 F (36.2 C)-97.9 F (36.6 C)] 97.9 F (36.6 C) (03/12 1211) Pulse Rate:  [51-106] 57 (03/12 1211) Resp:  [16-18] 18 (03/12 1211) BP: (96-106)/(64-78) 105/66 (03/12 1211) SpO2:  [93 %-100 %] 93 % (03/12 1211) Weight:  [657 kg] 127 kg (03/12 0557)  Weight change: 1.581 kg Filed Weights   10/21/21 0441 10/22/21 0410 10/23/21 0557  Weight: 117.9 kg 125.4 kg 127 kg    Intake/Output: I/O last 3 completed shifts: In: 8469 [P.O.:1820] Out: 6295 [Urine:3175]   Intake/Output this shift:  Total I/O In: 480 [P.O.:480] Out: 800 [Urine:800]  Physical Exam: General: No acute distress  Head: Moist oral mucosal membranes  Eyes: Anicteric  Lungs:  Clear bilateral, normal effort  Heart: Irregular, no rubs  Abdomen:  Soft, nontender, obese  Extremities: No peripheral edema.  Neurologic: Awake, alert,oriented  Skin: No acute lesions or rashes       Basic Metabolic Panel: Recent Labs  Lab 10/19/21 0707 10/20/21 0545 10/21/21 0607 10/22/21 0509 10/23/21 0509  NA 137 136 135 136 137  K 4.2 3.9  4.3 4.5 4.7  CL 104 100 102 100 102  CO2 '25 25 29 29 30  '$ GLUCOSE 121* 112* 99 122* 112*  BUN 34* 40* 47* 53* 60*  CREATININE 2.79* 2.96* 3.14* 2.97* 3.20*  CALCIUM 9.3 9.4 8.9 9.0 9.1     Liver Function Tests: Recent Labs  Lab 10/17/21 1915  AST 12*  ALT 8  ALKPHOS 58  BILITOT 0.6  PROT 7.3  ALBUMIN 2.4*    No results for input(s): LIPASE, AMYLASE in the last 168 hours. No results for input(s): AMMONIA in the last 168 hours.  CBC: Recent Labs  Lab 10/17/21 1915 10/19/21 0707 10/20/21 0545 10/21/21 0607 10/22/21 0509 10/23/21 0509  WBC 8.0 5.2 4.6 3.6* 4.3 4.3  NEUTROABS 5.8  --   --   --   --   --   HGB 10.1* 8.3* 8.1* 8.0* 8.0* 8.1*  HCT 36.6* 29.8* 27.9* 28.1* 28.0* 28.3*  MCV 97.1 95.2 93.3 94.6 93.3 94.0  PLT 333 255 249 247 267 261     Cardiac Enzymes: No results for input(s): CKTOTAL, CKMB, CKMBINDEX, TROPONINI in the last 168 hours.  BNP: Invalid input(s): POCBNP  CBG: Recent Labs  Lab 10/19/21 1647  GLUCAP 120*     Microbiology: Results for orders placed or performed during the hospital encounter of 10/17/21  Resp Panel by RT-PCR (Flu A&B, Covid) Nasopharyngeal Swab     Status: None   Collection Time: 10/17/21  7:16 PM   Specimen: Nasopharyngeal Swab; Nasopharyngeal(NP) swabs in vial transport medium  Result Value Ref Range  Status   SARS Coronavirus 2 by RT PCR NEGATIVE NEGATIVE Final    Comment: (NOTE) SARS-CoV-2 target nucleic acids are NOT DETECTED.  The SARS-CoV-2 RNA is generally detectable in upper respiratory specimens during the acute phase of infection. The lowest concentration of SARS-CoV-2 viral copies this assay can detect is 138 copies/mL. A negative result does not preclude SARS-Cov-2 infection and should not be used as the sole basis for treatment or other patient management decisions. A negative result may occur with  improper specimen collection/handling, submission of specimen other than nasopharyngeal swab, presence  of viral mutation(s) within the areas targeted by this assay, and inadequate number of viral copies(<138 copies/mL). A negative result must be combined with clinical observations, patient history, and epidemiological information. The expected result is Negative.  Fact Sheet for Patients:  EntrepreneurPulse.com.au  Fact Sheet for Healthcare Providers:  IncredibleEmployment.be  This test is no t yet approved or cleared by the [place] FDA and  has been authorized for detection and/or diagnosis of SARS-CoV-2 by FDA under an Emergency Use Authorization (EUA). This EUA will remain  in effect (meaning this test can be used) for the duration of the COVID-19 declaration under Section 564(b)(1) of the Act, 21 U.S.C.section 360bbb-3(b)(1), unless the authorization is terminated  or revoked sooner.       Influenza A by PCR NEGATIVE NEGATIVE Final   Influenza B by PCR NEGATIVE NEGATIVE Final    Comment: (NOTE) The Xpert Xpress SARS-CoV-2/FLU/RSV plus assay is intended as an aid in the diagnosis of influenza from Nasopharyngeal swab specimens and should not be used as a sole basis for treatment. Nasal washings and aspirates are unacceptable for Xpert Xpress SARS-CoV-2/FLU/RSV testing.  Fact Sheet for Patients: EntrepreneurPulse.com.au  Fact Sheet for Healthcare Providers: IncredibleEmployment.be  This test is not yet approved or cleared by the [place] FDA and has been authorized for detection and/or diagnosis of SARS-CoV-2 by FDA under an Emergency Use Authorization (EUA). This EUA will remain in effect (meaning this test can be used) for the duration of the COVID-19 declaration under Section 564(b)(1) of the Act, 21 U.S.C. section 360bbb-3(b)(1), unless the authorization is terminated or revoked.  Performed at [facility], [address] [place], [place] [address]   Blood culture (routine  x 2)     Status: Abnormal   Collection Time: 10/17/21  7:16 PM   Specimen: BLOOD  Result Value Ref Range Status   Specimen Description   Final    BLOOD LEFT WRIST Performed at [facility], [address] [place], [place] [address]    Special Requests   Final    BOTTLES DRAWN AEROBIC AND ANAEROBIC Blood Culture adequate volume Performed at [facility], [address] [place], [place] [address]    Culture  Setup Time   Final    GRAM POSITIVE COCCI IN BOTH AEROBIC AND ANAEROBIC BOTTLES CRITICAL RESULT CALLED TO, READ BACK BY AND VERIFIED WITH: Lonie Peak PHARMD 1422 10/18/21 HNM    Culture (A)  Final    STAPHYLOCOCCUS EPIDERMIDIS THE SIGNIFICANCE OF ISOLATING THIS ORGANISM FROM A SINGLE SET OF BLOOD CULTURES WHEN MULTIPLE SETS ARE DRAWN IS UNCERTAIN. PLEASE NOTIFY THE MICROBIOLOGY DEPARTMENT WITHIN ONE WEEK IF SPECIATION AND SENSITIVITIES ARE REQUIRED. Performed at [facility], [address], [place], [place] [address]    Report Status 10/21/2021 FINAL  Final  Blood culture (routine x 2)     Status: None   Collection Time: 10/17/21  7:16 PM  Specimen: BLOOD  Result Value Ref Range Status   Specimen Description BLOOD RIGHT ARM  Final   Special Requests   Final    BOTTLES DRAWN AEROBIC AND ANAEROBIC Blood Culture results may not be optimal due to an inadequate volume of blood received in culture bottles   Culture   Final    NO GROWTH 5 DAYS Performed at [facility], [address] [place], [place] [address]    Report Status 10/22/2021 FINAL  Final  Blood Culture ID Panel (Reflexed)     Status: Abnormal   Collection Time: 10/17/21  7:16 PM  Result Value Ref Range Status   Enterococcus faecalis NOT DETECTED NOT DETECTED Final   Enterococcus Faecium NOT DETECTED NOT DETECTED Final   Listeria monocytogenes NOT DETECTED NOT DETECTED Final   Staphylococcus species DETECTED (A) NOT DETECTED Final    Comment: CRITICAL RESULT CALLED TO, READ  BACK BY AND VERIFIED WITH: Lonie Peak PHARMD 1422 10/18/21 HNM    Staphylococcus aureus (BCID) NOT DETECTED NOT DETECTED Final   Staphylococcus epidermidis DETECTED (A) NOT DETECTED Final    Comment: CRITICAL RESULT CALLED TO, READ BACK BY AND VERIFIED WITH: Lonie Peak PIRJJO 8416 10/18/21 HNM    Staphylococcus lugdunensis NOT DETECTED NOT DETECTED Final   Streptococcus species NOT DETECTED NOT DETECTED Final   Streptococcus agalactiae NOT DETECTED NOT DETECTED Final   Streptococcus pneumoniae NOT DETECTED NOT DETECTED Final   Streptococcus pyogenes NOT DETECTED NOT DETECTED Final   A.calcoaceticus-baumannii NOT DETECTED NOT DETECTED Final   Bacteroides fragilis NOT DETECTED NOT DETECTED Final   Enterobacterales NOT DETECTED NOT DETECTED Final   Enterobacter cloacae complex NOT DETECTED NOT DETECTED Final   Escherichia coli NOT DETECTED NOT DETECTED Final   Klebsiella aerogenes NOT DETECTED NOT DETECTED Final   Klebsiella oxytoca NOT DETECTED NOT DETECTED Final   Klebsiella pneumoniae NOT DETECTED NOT DETECTED Final   Proteus species NOT DETECTED NOT DETECTED Final   Salmonella species NOT DETECTED NOT DETECTED Final   Serratia marcescens NOT DETECTED NOT DETECTED Final   Haemophilus influenzae NOT DETECTED NOT DETECTED Final   Neisseria meningitidis NOT DETECTED NOT DETECTED Final   Pseudomonas aeruginosa NOT DETECTED NOT DETECTED Final   Stenotrophomonas maltophilia NOT DETECTED NOT DETECTED Final   Candida albicans NOT DETECTED NOT DETECTED Final   Candida auris NOT DETECTED NOT DETECTED Final   Candida glabrata NOT DETECTED NOT DETECTED Final   Candida krusei NOT DETECTED NOT DETECTED Final   Candida parapsilosis NOT DETECTED NOT DETECTED Final   Candida tropicalis NOT DETECTED NOT DETECTED Final   Cryptococcus neoformans/gattii NOT DETECTED NOT DETECTED Final   Methicillin resistance mecA/C NOT DETECTED NOT DETECTED Final    Comment: Performed at [facility], [address] [place], [place] [address]  CULTURE, BLOOD (ROUTINE X 2) w Reflex to ID Panel     Status: None   Collection Time: 10/18/21  7:05 PM   Specimen: BLOOD  Result Value Ref Range Status   Specimen Description BLOOD [facility]  Final   Special Requests BOTTLES DRAWN AEROBIC AND ANAEROBIC BCAV  Final   Culture   Final    NO GROWTH 5 DAYS Performed at [facility], [address] [place], [place] [address]    Report Status 10/23/2021 FINAL  Final  CULTURE, BLOOD (ROUTINE X 2) w Reflex to ID Panel     Status: None   Collection Time: 10/18/21  7:05 PM   Specimen: BLOOD  Result Value Ref Range  Status   Specimen Description BLOOD LAC  Final   Special Requests BOTTLES DRAWN AEROBIC AND ANAEROBIC BCAV  Final   Culture   Final    NO GROWTH 5 DAYS Performed at [facility], [address] [place],  [address]    Report Status 10/23/2021 FINAL  Final    Coagulation Studies: No results for input(s): LABPROT, INR in the last 72 hours.  Urinalysis: No results for input(s): COLORURINE, LABSPEC, PHURINE, GLUCOSEU, HGBUR, BILIRUBINUR, KETONESUR, PROTEINUR, UROBILINOGEN, NITRITE, LEUKOCYTESUR in the last 72 hours.  Invalid input(s): APPERANCEUR    Imaging: DG Abd Portable 1V  Result Date: 10/23/2021 CLINICAL DATA:  Nausea and vomiting.  Abdominal pain. EXAM: PORTABLE ABDOMEN - 1 VIEW COMPARISON:  CT abdomen 01/01/2018 FINDINGS: Mild scarring or subsegmental atelectasis peripherally in the left lower lobe. Thoracolumbar spondylosis. Suture material projects over the lower abdomen. [place] projects over the left abdomen. Bilateral hip prostheses. Upper normal amount of stool in the colon. No dilated small bowel identified. Right upper quadrant clips from prior cholecystectomy. IMPRESSION: 1. Unremarkable bowel gas pattern. 2. Mild atelectasis or scarring at the left lung base. Electronically Signed   By: Van Clines M.D.   On: 10/23/2021 15:26     Medications:     sodium chloride     sodium chloride      amiodarone  200 mg Oral BID   apixaban  5 mg Oral BID   docusate sodium  200 mg Oral BID   DULoxetine  60 mg Oral BID   furosemide  20 mg Oral Daily   melatonin  5 mg Oral QHS   metoprolol succinate  25 mg Oral BID   mirtazapine  15 mg Oral QHS   mometasone-formoterol  2 puff Inhalation BID   multivitamin with minerals  1 tablet Oral Daily   tamsulosin  0.4 mg Oral QPC supper   acetaminophen, levalbuterol, ondansetron (ZOFRAN) IV, traMADol  Assessment/ Plan:  [name] is a 71 y.o.  male past medical conditions including obesity, A-fib on Eliquis, systolic heart failure, COPD with as needed O2 use, and chronic kidney disease stage IV.  Patient presents to the emergency department with complaints of shortness of breath.  Patient was admitted for Rapid atrial fibrillation ([place]) [I48.91] Atrial fibrillation with rapid ventricular response (HCC) [I48.91] Acute respiratory failure with hypoxemia (HCC) [J96.01]   Acute Kidney Injury on chronic kidney disease stage IV with baseline creatinine 2.35 and GFR of 27 on 10/17/21.  Acute kidney injury secondary to hypervolemic state and diuresis Chronic kidney disease is secondary to hypertension and chronic heart failure No IV contrast exposure   Lab Results  Component Value Date   CREATININE 3.20 (H) 10/23/2021   CREATININE 2.97 (H) 10/22/2021   CREATININE 3.14 (H) 10/21/2021    Intake/Output Summary (Last 24 hours) at 10/23/2021 1613 Last data filed at 10/23/2021 1528 Gross per 24 hour  Intake 1200 ml  Output 1650 ml  Net -450 ml   Renal function was a bit worse today with BUN up to 60 and creatinine up to 3.2.  Patient having good urine output.  He is currently on low-dose diuresis.  Continue to monitor renal parameters closely.  2.  Hypotension with history of hypertension with chronic kidney disease as outpatient. Home regimen includes furosemide, metoprolol, and diltiazem.   Also prescribed tamsulosin.   Receiving Furosemide and Metoprolol Succinate. Blood pressure currently 105/66  3.  Chronic systolic heart failure.  Echo from 10/18/2021 shows EF 35  to 40% with moderate LVH.  Okay to continue furosemide 20 mg daily.  4.  Atrial fibrillation with history of rapid RVR.  Currently on metoprolol.   LOS: 6 Jermesha Sottile 3/12/20234:13 PM

## 2021-10-23 NOTE — Progress Notes (Signed)
? ?Progress Note ? ?Patient Name: Matthew [name] ?Date of Encounter: 10/23/2021 ? ?[facility] Cardiologist: Nelva Bush, MD  ? ?Subjective  ? ?Feeling OK.  Denies CP.  L leg pain.  ? ?Inpatient Medications  ?  ?Scheduled Meds: ? amiodarone  200 mg Oral BID  ? apixaban  5 mg Oral BID  ? docusate sodium  200 mg Oral BID  ? DULoxetine  60 mg Oral BID  ? furosemide  20 mg Oral Daily  ? melatonin  5 mg Oral QHS  ? metoprolol succinate  25 mg Oral BID  ? mirtazapine  15 mg Oral QHS  ? mometasone-formoterol  2 puff Inhalation BID  ? multivitamin with minerals  1 tablet Oral Daily  ? tamsulosin  0.4 mg Oral QPC supper  ? ?Continuous Infusions: ? sodium chloride    ? sodium chloride    ? ?PRN Meds: ?acetaminophen, levalbuterol, ondansetron (ZOFRAN) IV  ? ?Vital Signs  ?  ?Vitals:  ? 10/23/21 0557 10/23/21 0750 10/23/21 0839 10/23/21 0922  ?BP:  96/78  104/74  ?Pulse:  88 94 99  ?Resp:  18 18   ?Temp:  97.9 ?F (36.6 ?C)    ?TempSrc:      ?SpO2:  95% 94%   ?Weight: 127 kg     ?Height:      ? ? ?Intake/Output Summary (Last 24 hours) at 10/23/2021 1205 ?Last data filed at 10/23/2021 1007 ?Gross per 24 hour  ?Intake 960 ml  ?Output 1800 ml  ?Net -840 ml  ? ?Last 3 Weights 10/23/2021 10/22/2021 10/21/2021  ?Weight (lbs) 279 lb 15.8 oz 276 lb 8 oz 259 lb 14.8 oz  ?Weight (kg) 127 kg 125.42 kg 117.9 kg  ?   ? ?Telemetry  ?  ?Atrial fibrillation.  Rate 90s-100s.  - Personally Reviewed ? ?ECG  ?  ?N/a - Personally Reviewed ? ?Physical Exam  ? ?VS:  BP 104/74   Pulse 99   Temp 97.9 ?F (36.6 ?C)   Resp 18   Ht '6\' 2"'$  (1.88 m)   Wt 127 kg   SpO2 94%   BMI 35.95 kg/m?  , BMI Body mass index is 35.95 kg/m?. ?GENERAL:  Well appearing ?HEENT: Pupils equal round and reactive, fundi not visualized, oral mucosa unremarkable ?NECK: Unable to assess JVP.  No bruits, no thyromegaly ?LUNGS:  Clear to auscultation bilaterally ?HEART: Tachycardic.  Irregularly irregular.  PMI not displaced or sustained,S1 and S2 within normal limits, no S3,  no S4, no clicks, no rubs, no murmurs ?ABD:  Flat, positive bowel sounds normal in frequency in pitch, no bruits, no rebound, no guarding, no midline pulsatile mass, no hepatomegaly, no splenomegaly ?EXT:  2 plus pulses throughout, 1+ LE edema, no cyanosis no clubbing ?SKIN:  No rashes no nodules ?NEURO:  Cranial nerves II through XII grossly intact, motor grossly intact throughout ?PSYCH:  Cognitively intact, oriented to person place and time ? ? ?Labs  ?  ?High Sensitivity Troponin:   ?Recent Labs  ?Lab 10/17/21 ?1915 10/17/21 ?2116  ?TROPONINIHS 15 13  ?   ?Chemistry ?Recent Labs  ?Lab 10/17/21 ?1915 10/18/21 ?1004 10/21/21 ?6440 10/22/21 ?3474 10/23/21 ?2595  ?NA 139   < > 135 136 137  ?K 4.4   < > 4.3 4.5 4.7  ?CL 106   < > 102 100 102  ?CO2 23   < > '29 29 30  '$ ?GLUCOSE 120*   < > 99 122* 112*  ?BUN 30*   < >  47* 53* 60*  ?CREATININE 2.45*   < > 3.14* 2.97* 3.20*  ?CALCIUM 9.7   < > 8.9 9.0 9.1  ?PROT 7.3  --   --   --   --   ?ALBUMIN 2.4*  --   --   --   --   ?AST 12*  --   --   --   --   ?ALT 8  --   --   --   --   ?ALKPHOS 58  --   --   --   --   ?BILITOT 0.6  --   --   --   --   ?GFRNONAA 27*   < > 20* 22* 20*  ?ANIONGAP 10   < > 4* 7 5  ? < > = values in this interval not displayed.  ?  ?Lipids No results for input(s): CHOL, TRIG, HDL, LABVLDL, LDLCALC, CHOLHDL in the last 168 hours.  ?Hematology ?Recent Labs  ?Lab 10/21/21 ?0607 10/22/21 ?4982 10/23/21 ?6415  ?WBC 3.6* 4.3 4.3  ?RBC 2.97* 3.00* 3.01*  ?HGB 8.0* 8.0* 8.1*  ?HCT 28.1* 28.0* 28.3*  ?MCV 94.6 93.3 94.0  ?MCH 26.9 26.7 26.9  ?MCHC 28.5* 28.6* 28.6*  ?RDW 14.1 14.4 14.6  ?PLT 247 267 261  ? ?Thyroid No results for input(s): TSH, FREET4 in the last 168 hours.  ?BNP ?Recent Labs  ?Lab 10/17/21 ?1916  ?BNP 691.7*  ?  ?DDimer No results for input(s): DDIMER in the last 168 hours.  ? ?Radiology  ?  ?No results found. ? ?Cardiac Studies  ? ?Echo 10/18/21: ? 1. Left ventricular ejection fraction, by estimation, is 35 to 40%. The  ?left ventricle has  moderately decreased function. Left ventricular  ?endocardial border not optimally defined to evaluate regional wall motion.  ?The left ventricular internal cavity  ?size was mildly to moderately dilated. There is moderate left ventricular  ?hypertrophy. Left ventricular diastolic function could not be evaluated.  ? 2. Right ventricular systolic function is normal. The right ventricular  ?size is mildly enlarged. Mildly increased right ventricular wall  ?thickness.  ? 3. Left atrial size was mildly dilated.  ? 4. Right atrial size was mildly dilated.  ? 5. The mitral valve was not well visualized. Mild mitral valve  ?regurgitation.  ? 6. The aortic valve is tricuspid. Aortic valve regurgitation is not  ?visualized. No aortic stenosis is present.  ? 7. The inferior vena cava is dilated in size with >50% respiratory  ?variability, suggesting right atrial pressure of 8 mmHg.  ? ?[place] 10/17/2018: ?No CAD. ? ?Patient Profile  ?   ?71 y.o. male with chronic systolic and diastolic heart failure, persistent atrial fibrillation, asthma/COPD, CKD 3/4, anemia, disease, and obesity admitted with A-fib with RVR.  He was admitted with a COPD exacerbation requiring BiPAP. ? ?Assessment & Plan  ?  ?# Persistent atrial fibrillation:  ?He remains in atrial fibrillation with reasonably well-controlled rates.  Though he is frequently over 100 bpm at rest.  Unable to further titrate nodal agents due to low blood pressures.  He is tolerating amiodarone 200 mg daily.  Plan for TEE/DCCV tomorrow.  He is n.p.o. after midnight.  This was discussed with the special staff on Friday and will need to be reminded on Monday morning. ? ?#Acute on chronic systolic and diastolic heart failure: ?LVEF this admission 35 to 40%.  Thought to be nonischemic.  He had a left heart cath in 2020 that revealed no coronary disease.  Continue metoprolol succinate.  Add GDMT as possible once in sinus rhythm.  Likely nonischemic in the setting of A-fib with RVR and  COPD exacerbation. ? ?#COPD exacerbation: ?Lungs are sounding better now.  Managed by IM. ? ?#CKD 4: ?Creatinine has ranged from 2.4-3.2.  Continue Lasix 20 mg daily. ? ?#Normocytic anemia: ?Prior GI bleed.  He has declined invasive work-up.  H/H is stable on Eliquis.  Consider [facility] as an outpatient.  ? ?   ? ?For questions or updates, please contact [facility] ?Please consult [email or web address] for contact info under  ? ?  ?   ?Signed, ?Skeet Latch, MD  ?10/23/2021, 12:05 PM   ? ?

## 2021-10-23 NOTE — Progress Notes (Signed)
PROGRESS NOTE    Matthew [name]  DTO:[id number] DOB: 09/20/1950 DOA: 10/17/2021 PCP: [name], MD  Assessment & Plan:   Principal Problem:   Rapid atrial fibrillation ([place]) Active Problems:   Acute on chronic respiratory failure with hypoxia (HCC)   Acute on chronic diastolic CHF (congestive heart failure) (HCC)   Hypertension   COPD (chronic obstructive pulmonary disease) (HCC)   CKD (chronic kidney disease) stage 4, GFR 15-29 ml/min (HCC)   Obesity, Class III, BMI 40-49.9 (morbid obesity) (HCC)   OSA (obstructive sleep apnea)   NICM (nonischemic cardiomyopathy) (HCC)   PAF: w/ RVR & has progressed to persistent as per cardio. Likely secondary to medication non-compliance. Continue on eliquis, metoprolol. Will go for cardioversion tomorrow as per cardio    Unlikely bacteremia: 1/4 with s epi, likely contaminant. Repeat blood cxs NGTD    Acute on chronic systolic CHF: continue on lasix. Monitor I/Os   Acute hypoxic respiratory failure: 88% on RA documented in ED. Weaned off of BiPAP. Continue on supplemental oxygen and wean as tolerated    HTN: continue on metoprolol   COPD: w/o exacerbation. Continue on bronchodilators & encourage incentive spirometry    AKI on CKDIV: Cr is labile. Avoid nephrotoxic meds    OSA: not on CPAP at home   BPH: continue on home dose of flomax     Depression: severity unknown. Continue on home dose of mirtazapine, duloxetine   Gout: not recently taking allopurinol    ACD: likely secondary to CKD. H&H are labile   Obesity: BMI 35.9. Complicates overall care & prognosis     DVT prophylaxis: eliquis  Code Status: full Family Communication:  Disposition Plan: d/c home w/ HH   Level of care: Progressive  Status is: Inpatient Remains inpatient appropriate because: cardioversion on 10/24/21 as per cardio  Consultants:  Cardio   Procedures:  Antimicrobials:   Subjective: Pt c/o fatigue   Objective: Vitals:   10/23/21 0028  10/23/21 0357 10/23/21 0557 10/23/21 0750  BP:  106/75  96/78  Pulse: (!) 106 (!) 103  88  Resp:  18  18  Temp:  (!) 97.1 F (36.2 C)  97.9 F (36.6 C)  TempSrc:  Axillary    SpO2:  100%  95%  Weight:   127 kg   Height:        Intake/Output Summary (Last 24 hours) at 10/23/2021 0753 Last data filed at 10/23/2021 0405 Gross per 24 hour  Intake 1340 ml  Output 2000 ml  Net -660 ml   Filed Weights   10/21/21 0441 10/22/21 0410 10/23/21 0557  Weight: 117.9 kg 125.4 kg 127 kg    Examination:  General exam: Appears calm & comfortable   Respiratory system: diminished breath sounds b/l  Cardiovascular system: irregularly irregular. No rubs  Gastrointestinal system: Abd is soft, NT, obese & normal bowel sounds   Central nervous system: alert and oriented. Moves all extremities  Psychiatry: Judgement and insight appears normal. Appropriate mood and affect     Data Reviewed: I have personally reviewed following labs and imaging studies  CBC: Recent Labs  Lab 10/17/21 1915 10/19/21 0707 10/20/21 0545 10/21/21 0607 10/22/21 0509 10/23/21 0509  WBC 8.0 5.2 4.6 3.6* 4.3 4.3  NEUTROABS 5.8  --   --   --   --   --   HGB 10.1* 8.3* 8.1* 8.0* 8.0* 8.1*  HCT 36.6* 29.8* 27.9* 28.1* 28.0* 28.3*  MCV 97.1 95.2 93.3 94.6 93.3 94.0  PLT 333  255 249 247 267 466   Basic Metabolic Panel: Recent Labs  Lab 10/19/21 0707 10/20/21 0545 10/21/21 0607 10/22/21 0509 10/23/21 0509  NA 137 136 135 136 137  K 4.2 3.9 4.3 4.5 4.7  CL 104 100 102 100 102  CO2 '25 25 29 29 30  '$ GLUCOSE 121* 112* 99 122* 112*  BUN 34* 40* 47* 53* 60*  CREATININE 2.79* 2.96* 3.14* 2.97* 3.20*  CALCIUM 9.3 9.4 8.9 9.0 9.1   GFR: Estimated Creatinine Clearance: 30 mL/min (A) (by C-G formula based on SCr of 3.2 mg/dL (H)). Liver Function Tests: Recent Labs  Lab 10/17/21 1915  AST 12*  ALT 8  ALKPHOS 58  BILITOT 0.6  PROT 7.3  ALBUMIN 2.4*   No results for input(s): LIPASE, AMYLASE in the last 168  hours. No results for input(s): AMMONIA in the last 168 hours. Coagulation Profile: No results for input(s): INR, PROTIME in the last 168 hours. Cardiac Enzymes: No results for input(s): CKTOTAL, CKMB, CKMBINDEX, TROPONINI in the last 168 hours. BNP (last 3 results) No results for input(s): PROBNP in the last 8760 hours. HbA1C: No results for input(s): HGBA1C in the last 72 hours. CBG: Recent Labs  Lab 10/19/21 1647  GLUCAP 120*   Lipid Profile: No results for input(s): CHOL, HDL, LDLCALC, TRIG, CHOLHDL, LDLDIRECT in the last 72 hours. Thyroid Function Tests: No results for input(s): TSH, T4TOTAL, FREET4, T3FREE, THYROIDAB in the last 72 hours. Anemia Panel: No results for input(s): VITAMINB12, FOLATE, FERRITIN, TIBC, IRON, RETICCTPCT in the last 72 hours. Sepsis Labs: Recent Labs  Lab 10/17/21 1915 10/17/21 2116  PROCALCITON 5.36  --   LATICACIDVEN 1.4 0.9    Recent Results (from the past 240 hour(s))  Resp Panel by RT-PCR (Flu A&B, Covid) Nasopharyngeal Swab     Status: None   Collection Time: 10/17/21  7:16 PM   Specimen: Nasopharyngeal Swab; Nasopharyngeal(NP) swabs in vial transport medium  Result Value Ref Range Status   SARS Coronavirus 2 by RT PCR NEGATIVE NEGATIVE Final    Comment: (NOTE) SARS-CoV-2 target nucleic acids are NOT DETECTED.  The SARS-CoV-2 RNA is generally detectable in upper respiratory specimens during the acute phase of infection. The lowest concentration of SARS-CoV-2 viral copies this assay can detect is 138 copies/mL. A negative result does not preclude SARS-Cov-2 infection and should not be used as the sole basis for treatment or other patient management decisions. A negative result may occur with  improper specimen collection/handling, submission of specimen other than nasopharyngeal swab, presence of viral mutation(s) within the areas targeted by this assay, and inadequate number of viral copies(<138 copies/mL). A negative result must  be combined with clinical observations, patient history, and epidemiological information. The expected result is Negative.  Fact Sheet for Patients:  EntrepreneurPulse.com.au  Fact Sheet for Healthcare Providers:  IncredibleEmployment.be  This test is no t yet approved or cleared by the [place] FDA and  has been authorized for detection and/or diagnosis of SARS-CoV-2 by FDA under an Emergency Use Authorization (EUA). This EUA will remain  in effect (meaning this test can be used) for the duration of the COVID-19 declaration under Section 564(b)(1) of the Act, 21 U.S.C.section 360bbb-3(b)(1), unless the authorization is terminated  or revoked sooner.       Influenza A by PCR NEGATIVE NEGATIVE Final   Influenza B by PCR NEGATIVE NEGATIVE Final    Comment: (NOTE) The Xpert Xpress SARS-CoV-2/FLU/RSV plus assay is intended as an aid in the diagnosis of influenza  from Nasopharyngeal swab specimens and should not be used as a sole basis for treatment. Nasal washings and aspirates are unacceptable for Xpert Xpress SARS-CoV-2/FLU/RSV testing.  Fact Sheet for Patients: EntrepreneurPulse.com.au  Fact Sheet for Healthcare Providers: IncredibleEmployment.be  This test is not yet approved or cleared by the [place] FDA and has been authorized for detection and/or diagnosis of SARS-CoV-2 by FDA under an Emergency Use Authorization (EUA). This EUA will remain in effect (meaning this test can be used) for the duration of the COVID-19 declaration under Section 564(b)(1) of the Act, 21 U.S.C. section 360bbb-3(b)(1), unless the authorization is terminated or revoked.  Performed at [facility], [address] [place], [place] [address]   Blood culture (routine x 2)     Status: Abnormal   Collection Time: 10/17/21  7:16 PM   Specimen: BLOOD  Result Value Ref Range Status   Specimen Description    Final    BLOOD LEFT WRIST Performed at [facility], [address] [place], [place] [address]    Special Requests   Final    BOTTLES DRAWN AEROBIC AND ANAEROBIC Blood Culture adequate volume Performed at [facility], [address] [place], [place] [address]    Culture  Setup Time   Final    GRAM POSITIVE COCCI IN BOTH AEROBIC AND ANAEROBIC BOTTLES CRITICAL RESULT CALLED TO, READ BACK BY AND VERIFIED WITH: Lonie Peak PHARMD 1422 10/18/21 HNM    Culture (A)  Final    STAPHYLOCOCCUS EPIDERMIDIS THE SIGNIFICANCE OF ISOLATING THIS ORGANISM FROM A SINGLE SET OF BLOOD CULTURES WHEN MULTIPLE SETS ARE DRAWN IS UNCERTAIN. PLEASE NOTIFY THE MICROBIOLOGY DEPARTMENT WITHIN ONE WEEK IF SPECIATION AND SENSITIVITIES ARE REQUIRED. Performed at [facility], [address], [place], [place] [address]    Report Status 10/21/2021 FINAL  Final  Blood culture (routine x 2)     Status: None   Collection Time: 10/17/21  7:16 PM   Specimen: BLOOD  Result Value Ref Range Status   Specimen Description BLOOD RIGHT ARM  Final   Special Requests   Final    BOTTLES DRAWN AEROBIC AND ANAEROBIC Blood Culture results may not be optimal due to an inadequate volume of blood received in culture bottles   Culture   Final    NO GROWTH 5 DAYS Performed at [facility], [address] [place],  [address]    Report Status 10/22/2021 FINAL  Final  Blood Culture ID Panel (Reflexed)     Status: Abnormal   Collection Time: 10/17/21  7:16 PM  Result Value Ref Range Status   Enterococcus faecalis NOT DETECTED NOT DETECTED Final   Enterococcus Faecium NOT DETECTED NOT DETECTED Final   Listeria monocytogenes NOT DETECTED NOT DETECTED Final   Staphylococcus species DETECTED (A) NOT DETECTED Final    Comment: CRITICAL RESULT CALLED TO, READ BACK BY AND VERIFIED WITH: Lonie Peak PHARMD 1422 10/18/21 HNM    Staphylococcus aureus (BCID) NOT DETECTED NOT DETECTED Final    Staphylococcus epidermidis DETECTED (A) NOT DETECTED Final    Comment: CRITICAL RESULT CALLED TO, READ BACK BY AND VERIFIED WITH: Lonie Peak PHARMD 1422 10/18/21 HNM    Staphylococcus lugdunensis NOT DETECTED NOT DETECTED Final   Streptococcus species NOT DETECTED NOT DETECTED Final   Streptococcus agalactiae NOT DETECTED NOT DETECTED Final   Streptococcus pneumoniae NOT DETECTED NOT DETECTED Final   Streptococcus pyogenes NOT DETECTED NOT DETECTED Final   A.calcoaceticus-baumannii NOT DETECTED NOT DETECTED Final  Bacteroides fragilis NOT DETECTED NOT DETECTED Final   Enterobacterales NOT DETECTED NOT DETECTED Final   Enterobacter cloacae complex NOT DETECTED NOT DETECTED Final   Escherichia coli NOT DETECTED NOT DETECTED Final   Klebsiella aerogenes NOT DETECTED NOT DETECTED Final   Klebsiella oxytoca NOT DETECTED NOT DETECTED Final   Klebsiella pneumoniae NOT DETECTED NOT DETECTED Final   Proteus species NOT DETECTED NOT DETECTED Final   Salmonella species NOT DETECTED NOT DETECTED Final   Serratia marcescens NOT DETECTED NOT DETECTED Final   Haemophilus influenzae NOT DETECTED NOT DETECTED Final   Neisseria meningitidis NOT DETECTED NOT DETECTED Final   Pseudomonas aeruginosa NOT DETECTED NOT DETECTED Final   Stenotrophomonas maltophilia NOT DETECTED NOT DETECTED Final   Candida albicans NOT DETECTED NOT DETECTED Final   Candida auris NOT DETECTED NOT DETECTED Final   Candida glabrata NOT DETECTED NOT DETECTED Final   Candida krusei NOT DETECTED NOT DETECTED Final   Candida parapsilosis NOT DETECTED NOT DETECTED Final   Candida tropicalis NOT DETECTED NOT DETECTED Final   Cryptococcus neoformans/gattii NOT DETECTED NOT DETECTED Final   Methicillin resistance mecA/C NOT DETECTED NOT DETECTED Final    Comment: Performed at [facility], [address] [place], [place] [address]  CULTURE, BLOOD (ROUTINE X 2) w Reflex to ID Panel     Status: None   Collection Time:  10/18/21  7:05 PM   Specimen: BLOOD  Result Value Ref Range Status   Specimen Description BLOOD [facility]  Final   Special Requests BOTTLES DRAWN AEROBIC AND ANAEROBIC BCAV  Final   Culture   Final    NO GROWTH 5 DAYS Performed at [facility], [address] [place], [place] [address]    Report Status 10/23/2021 FINAL  Final  CULTURE, BLOOD (ROUTINE X 2) w Reflex to ID Panel     Status: None   Collection Time: 10/18/21  7:05 PM   Specimen: BLOOD  Result Value Ref Range Status   Specimen Description BLOOD LAC  Final   Special Requests BOTTLES DRAWN AEROBIC AND ANAEROBIC BCAV  Final   Culture   Final    NO GROWTH 5 DAYS Performed at [facility], [address], [place], [place] [address]    Report Status 10/23/2021 FINAL  Final         Radiology Studies: No results found.      Scheduled Meds:  amiodarone  200 mg Oral BID   apixaban  5 mg Oral BID   docusate sodium  200 mg Oral BID   DULoxetine  60 mg Oral BID   furosemide  20 mg Oral Daily   levalbuterol  0.63 mg Nebulization TID   melatonin  5 mg Oral QHS   metoprolol succinate  25 mg Oral BID   mirtazapine  15 mg Oral QHS   mometasone-formoterol  2 puff Inhalation BID   multivitamin with minerals  1 tablet Oral Daily   tamsulosin  0.4 mg Oral QPC supper   Continuous Infusions:  sodium chloride     sodium chloride        LOS: 6 days    Time spent: 15 mins     Wyvonnia Dusky, MD [facility] Pager [phone]  If 7PM-7AM, please contact night-coverage 10/23/2021, 7:53 AM

## 2021-10-24 ENCOUNTER — Inpatient Hospital Stay: Payer: Medicare HMO | Admitting: Anesthesiology

## 2021-10-24 ENCOUNTER — Inpatient Hospital Stay (HOSPITAL_COMMUNITY)
Admit: 2021-10-24 | Discharge: 2021-10-24 | Disposition: A | Discharge status: Home / Self Care | Payer: Medicare HMO | Attending: Nurse Practitioner | Admitting: Nurse Practitioner

## 2021-10-24 ENCOUNTER — Encounter
Admission: EM | Disposition: A | Discharge status: Home Health | Payer: Self-pay | Source: Home / Self Care | Attending: Internal Medicine

## 2021-10-24 DIAGNOSIS — I4891 Unspecified atrial fibrillation: Secondary | ICD-10-CM | POA: Diagnosis not present

## 2021-10-24 DIAGNOSIS — I340 Nonrheumatic mitral (valve) insufficiency: Secondary | ICD-10-CM

## 2021-10-24 DIAGNOSIS — I5043 Acute on chronic combined systolic (congestive) and diastolic (congestive) heart failure: Secondary | ICD-10-CM | POA: Diagnosis not present

## 2021-10-24 DIAGNOSIS — I361 Nonrheumatic tricuspid (valve) insufficiency: Secondary | ICD-10-CM

## 2021-10-24 DIAGNOSIS — E669 Obesity, unspecified: Secondary | ICD-10-CM | POA: Diagnosis not present

## 2021-10-24 DIAGNOSIS — N184 Chronic kidney disease, stage 4 (severe): Secondary | ICD-10-CM | POA: Diagnosis not present

## 2021-10-24 HISTORY — PX: TEE WITHOUT CARDIOVERSION: SHX5443

## 2021-10-24 HISTORY — PX: CARDIOVERSION: SHX1299

## 2021-10-24 LAB — BASIC METABOLIC PANEL
Anion gap: 10 (ref 5–15)
BUN: 58 mg/dL — ABNORMAL HIGH (ref 8–23)
CO2: 28 mmol/L (ref 22–32)
Calcium: 9.6 mg/dL (ref 8.9–10.3)
Chloride: 100 mmol/L (ref 98–111)
Creatinine, Ser: 2.74 mg/dL — ABNORMAL HIGH (ref 0.61–1.24)
GFR, Estimated: 24 mL/min — ABNORMAL LOW (ref >60)
Glucose, Bld: 95 mg/dL (ref 70–99)
Potassium: 4.9 mmol/L (ref 3.5–5.1)
Sodium: 138 mmol/L (ref 135–145)

## 2021-10-24 LAB — CBC
HCT: 29.6 % — ABNORMAL LOW (ref 39.0–52.0)
Hemoglobin: 8.3 g/dL — ABNORMAL LOW (ref 13.0–17.0)
MCH: 26.9 pg (ref 26.0–34.0)
MCHC: 28 g/dL — ABNORMAL LOW (ref 30.0–36.0)
MCV: 95.8 fL (ref 80.0–100.0)
Platelets: 294 10*3/uL (ref 150–400)
RBC: 3.09 MIL/uL — ABNORMAL LOW (ref 4.22–5.81)
RDW: 14.8 % (ref 11.5–15.5)
WBC: 4.7 10*3/uL (ref 4.0–10.5)
nRBC: 0 % (ref 0.0–0.2)

## 2021-10-24 SURGERY — CARDIOVERSION
Anesthesia: General

## 2021-10-24 SURGERY — ECHOCARDIOGRAM, TRANSESOPHAGEAL
Anesthesia: General

## 2021-10-24 MED ORDER — PHENYLEPHRINE 40 MCG/ML (10ML) SYRINGE FOR IV PUSH (FOR BLOOD PRESSURE SUPPORT)
PREFILLED_SYRINGE | INTRAVENOUS | Status: DC | PRN
  Administered 2021-10-24 (×3): 80 ug via INTRAVENOUS

## 2021-10-24 MED ORDER — AMIODARONE HCL IN DEXTROSE 360-4.14 MG/200ML-% IV SOLN
60.0000 mg/h | INTRAVENOUS | Status: DC
  Administered 2021-10-24 (×2): 60 mg/h via INTRAVENOUS
  Filled 2021-10-24 (×2): qty 200

## 2021-10-24 MED ORDER — GLUCERNA SHAKE PO LIQD
237.0000 mL | Freq: Two times a day (BID) | ORAL | Status: DC
Start: 2021-10-24 — End: 2021-10-29
  Administered 2021-10-24 – 2021-10-29 (×5): 237 mL via ORAL

## 2021-10-24 MED ORDER — PROPOFOL 10 MG/ML IV BOLUS
INTRAVENOUS | Status: DC | PRN
  Administered 2021-10-24 (×2): 50 mg via INTRAVENOUS
  Administered 2021-10-24: 30 mg via INTRAVENOUS
  Administered 2021-10-24: 20 mg via INTRAVENOUS

## 2021-10-24 MED ORDER — AMIODARONE LOAD VIA INFUSION
150.0000 mg | Freq: Once | INTRAVENOUS | Status: AC
  Administered 2021-10-24: 150 mg via INTRAVENOUS
  Filled 2021-10-24: qty 83.34

## 2021-10-24 MED ORDER — ENSURE MAX PROTEIN PO LIQD
11.0000 [oz_av] | Freq: Every day | ORAL | Status: DC
  Administered 2021-10-24 – 2021-10-28 (×5): 11 [oz_av] via ORAL
  Filled 2021-10-24: qty 330

## 2021-10-24 MED ORDER — AMIODARONE HCL IN DEXTROSE 360-4.14 MG/200ML-% IV SOLN
30.0000 mg/h | INTRAVENOUS | Status: DC
  Administered 2021-10-24 – 2021-10-27 (×6): 30 mg/h via INTRAVENOUS
  Filled 2021-10-24 (×5): qty 200

## 2021-10-24 MED ORDER — LIDOCAINE VISCOUS HCL 2 % MT SOLN
OROMUCOSAL | Status: AC
  Filled 2021-10-24: qty 15

## 2021-10-24 NOTE — Care Management Important Message (Signed)
Important Message ? ?Patient Details  ?Name: Matthew [name] ?MRN: [id number] ?Date of Birth: November 15, 1950 ? ? ?Medicare Important Message Given:  Yes ? ?Patient asleep during time of visit.  Copy of Medicare IM left on windowsill for reference. ? ? ?Dannette Barbara ?10/24/2021, 11:44 AM ?

## 2021-10-24 NOTE — Anesthesia Preprocedure Evaluation (Addendum)
Anesthesia Evaluation  ?Patient identified by MRN, date of birth, ID band ?Patient awake ? ? ? ?Reviewed: ?Allergy & Precautions, NPO status , Patient's Chart, lab work & pertinent test results ? ?History of Anesthesia Complications ?Negative for: history of anesthetic complications ? ?Airway ?Mallampati: III ? ? ?Neck ROM: Full ? ? ? Dental ? ? ?Missing all but 8 teeth, some chipped:   ?Pulmonary ?asthma , sleep apnea , COPD,  ?  ?Pulmonary exam normal ?breath sounds clear to auscultation ? ? ? ? ? ? Cardiovascular ?hypertension, +CHF (EF 35-40%)  ?+ dysrhythmias (a fib on Eliquis)  ?Rhythm:Irregular Rate:Normal ? ?ECG 10/17/21:  ?Supraventricular tachycardia ?Repolarization abnormality, prob rate related ? ?Echo 10/18/21: ??1. Left ventricular ejection fraction, by estimation, is 35 to 40%. The left ventricle has moderately decreased function. Left ventricular endocardial border not optimally defined to evaluate regional wall motion. The left ventricular internal cavity size was mildly to moderately dilated. There is moderate left ventricular hypertrophy. Left ventricular diastolic function could not be evaluated.  ??2. Right ventricular systolic function is normal. The right ventricular size is mildly enlarged. Mildly increased right ventricular wall thickness.  ??3. Left atrial size was mildly dilated.  ??4. Right atrial size was mildly dilated.  ??5. The mitral valve was not well visualized. Mild mitral valve regurgitation.  ??6. The aortic valve is tricuspid. Aortic valve regurgitation is not visualized. No aortic stenosis is present.  ??7. The inferior vena cava is dilated in size with >50% respiratory variability, suggesting right atrial pressure of 8 mmHg. ?  ?Neuro/Psych ?PSYCHIATRIC DISORDERS Anxiety Depression Chronic pain ?  ? GI/Hepatic ?negative GI ROS,   ?Endo/Other  ?Obesity  ? Renal/GU ?Renal disease (stage IV CKD)  ? ?  ?Musculoskeletal ? ? Abdominal ?  ?Peds ?  Hematology ? ?(+) Blood dyscrasia, anemia ,   ?Anesthesia Other Findings ? ? Reproductive/Obstetrics ? ?  ? ? ? ? ? ? ? ? ? ? ? ? ? ?  ?  ? ? ? ? ? ? ? ?Anesthesia Physical ?Anesthesia Plan ? ?ASA: 3 ? ?Anesthesia Plan: General  ? ?Post-op Pain Management:   ? ?Induction: Intravenous ? ?PONV Risk Score and Plan: 2 and Propofol infusion, TIVA and Treatment may vary due to age or medical condition ? ?Airway Management Planned: Natural Airway ? ?Additional Equipment:  ? ?Intra-op Plan:  ? ?Post-operative Plan:  ? ?Informed Consent: I have reviewed the patients History and Physical, chart, labs and discussed the procedure including the risks, benefits and alternatives for the proposed anesthesia with the patient or authorized representative who has indicated his/her understanding and acceptance.  ? ? ? ? ? ?Plan Discussed with: CRNA ? ?Anesthesia Plan Comments: (LMA/GETA backup discussed.  Patient consented for risks of anesthesia including but not limited to:  ?- adverse reactions to medications ?- damage to eyes, teeth, lips or other oral mucosa ?- nerve damage due to positioning  ?- sore throat or hoarseness ?- damage to heart, brain, nerves, lungs, other parts of body or loss of life ? ?Informed patient about role of CRNA in peri- and intra-operative care.  Patient voiced understanding.)  ? ? ? ? ? ? ?Anesthesia Quick Evaluation ? ?

## 2021-10-24 NOTE — Progress Notes (Signed)
$'60mg'R$ /hour amiodarone drip started now. Bolus already given. ?

## 2021-10-24 NOTE — Anesthesia Procedure Notes (Signed)
Procedure Name: [place] ?Date/Time: 10/24/2021 12:42 PM ?Performed by: Lily Peer, Madelene Kaatz, CRNA ?Pre-anesthesia Checklist: Patient identified, Emergency Drugs available, Suction available, Patient being monitored and Timeout performed ?Patient Re-evaluated:Patient Re-evaluated prior to induction ?Oxygen Delivery Method: Simple face mask ?Induction Type: IV induction ? ? ? ? ?

## 2021-10-24 NOTE — Anesthesia Postprocedure Evaluation (Signed)
Anesthesia Post Note ? ?Patient: Matthew [name] ? ?Procedure(s) Performed: TRANSESOPHAGEAL ECHOCARDIOGRAM (TEE) ?CARDIOVERSION ? ?Patient location during evaluation: PACU ?Anesthesia Type: General ?Level of consciousness: awake and alert, oriented and patient cooperative ?Pain management: pain level controlled ?Vital Signs Assessment: post-procedure vital signs reviewed and stable ?Respiratory status: spontaneous breathing, nonlabored ventilation and respiratory function stable ?Cardiovascular status: blood pressure returned to baseline and stable ?Postop Assessment: adequate PO intake ?Anesthetic complications: no ? ? ?No notable events documented. ? ? ?Last Vitals:  ?Vitals:  ? 10/24/21 1332 10/24/21 1345  ?BP: 100/81 107/86  ?Pulse: 68 (!) 109  ?Resp: (!) 21 (!) 27  ?Temp:    ?SpO2: 97% 100%  ?  ?Last Pain:  ?Vitals:  ? 10/24/21 1345  ?TempSrc:   ?PainSc: 0-No pain  ? ? ?  ?  ?  ?  ?  ?  ? ?Darrin Nipper ? ? ? ? ?

## 2021-10-24 NOTE — Progress Notes (Signed)
PROGRESS NOTE    Matthew [name]  LKT:[id number] DOB: 16-Sep-1950 DOA: 10/17/2021 PCP: [name], MD  Assessment & Plan:   Principal Problem:   Rapid atrial fibrillation ([place]) Active Problems:   Acute on chronic respiratory failure with hypoxia (HCC)   Acute on chronic diastolic CHF (congestive heart failure) (HCC)   Hypertension   COPD (chronic obstructive pulmonary disease) (HCC)   CKD (chronic kidney disease) stage 4, GFR 15-29 ml/min (HCC)   Obesity, Class III, BMI 40-49.9 (morbid obesity) (HCC)   OSA (obstructive sleep apnea)   NICM (nonischemic cardiomyopathy) (HCC)   PAF: w/ RVR & has progressed to persistent as per cardio. Likely secondary to medication non-compliance. Continue on eliquis, metoprolol. S/p cardioversion x 2, unsuccessful as EKG still shows persistent a. fib    Unlikely bacteremia: 1/4 with s epi, likely contaminant. Repeat blood cxs NGTD    Acute on chronic systolic CHF: continue on lasix. Monitor I/Os   Acute hypoxic respiratory failure: 88% on RA documented in ED. Weaned off of BiPAP.Weaned off of supplemental oxygen    HTN: continue on BB    COPD: w/o exacerbation. Continue on bronchodilators & encourage incentive spiormetry    AKI on CKDIV: Cr is trending down today. Avoid nephrotoxic meds    OSA: not on CPAP at home    BPH: continue on home dose of flomax   Depression: severity unknown. Continue on home dose of duloxetine, mirtazapine   Gout: not recently taking allopurinol    ACD: likely secondary to CKD. H&H are labile   Obesity: BMI 35.9. Complicates overall care & prognosis      DVT prophylaxis: eliquis  Code Status: full Family Communication:  Disposition Plan: d/c home w/ HH   Level of care: Progressive  Status is: Inpatient Remains inpatient appropriate because: s/p cardioversion today but was unsuccessful. Waiting medical equipment to be delivered to pt's house today. Can likely d/c home tomorrow  Consultants:  Cardio    Procedures:  Antimicrobials:   Subjective: Pt c/o fatigue  Objective: Vitals:   10/23/21 1953 10/23/21 2323 10/24/21 0414 10/24/21 0416  BP: 117/70 110/71 99/65   Pulse: 82 (!) 101 73   Resp: '18 18 20   '$ Temp: 97.8 F (36.6 C) 97.6 F (36.4 C) 97.7 F (36.5 C)   TempSrc:      SpO2: 92% 92% 95%   Weight:    126.6 kg  Height:        Intake/Output Summary (Last 24 hours) at 10/24/2021 0756 Last data filed at 10/24/2021 0416 Gross per 24 hour  Intake 720 ml  Output 1650 ml  Net -930 ml   Filed Weights   10/22/21 0410 10/23/21 0557 10/24/21 0416  Weight: 125.4 kg 127 kg 126.6 kg    Examination:  General exam: Appears comfortable. Obese  Respiratory system: decreased breath sounds b/l. No wheezes Cardiovascular system: irregularly irregular. No rubs or clicks  Gastrointestinal system: Abd is soft, NT, ND & hypoactive bowel sounds Central nervous system: alert and oriented. Moves all extremities Psychiatry: Judgement and insight appears normal. Appropriate mood and affect     Data Reviewed: I have personally reviewed following labs and imaging studies  CBC: Recent Labs  Lab 10/17/21 1915 10/19/21 0707 10/20/21 0545 10/21/21 0607 10/22/21 0509 10/23/21 0509  WBC 8.0 5.2 4.6 3.6* 4.3 4.3  NEUTROABS 5.8  --   --   --   --   --   HGB 10.1* 8.3* 8.1* 8.0* 8.0* 8.1*  HCT 36.6*  29.8* 27.9* 28.1* 28.0* 28.3*  MCV 97.1 95.2 93.3 94.6 93.3 94.0  PLT 333 255 249 247 267 395   Basic Metabolic Panel: Recent Labs  Lab 10/20/21 0545 10/21/21 0607 10/22/21 0509 10/23/21 0509 10/24/21 0512  NA 136 135 136 137 138  K 3.9 4.3 4.5 4.7 4.9  CL 100 102 100 102 100  CO2 '25 29 29 30 28  '$ GLUCOSE 112* 99 122* 112* 95  BUN 40* 47* 53* 60* 58*  CREATININE 2.96* 3.14* 2.97* 3.20* 2.74*  CALCIUM 9.4 8.9 9.0 9.1 9.6   GFR: Estimated Creatinine Clearance: 35 mL/min (A) (by C-G formula based on SCr of 2.74 mg/dL (H)). Liver Function Tests: Recent Labs  Lab 10/17/21 1915   AST 12*  ALT 8  ALKPHOS 58  BILITOT 0.6  PROT 7.3  ALBUMIN 2.4*   No results for input(s): LIPASE, AMYLASE in the last 168 hours. No results for input(s): AMMONIA in the last 168 hours. Coagulation Profile: No results for input(s): INR, PROTIME in the last 168 hours. Cardiac Enzymes: No results for input(s): CKTOTAL, CKMB, CKMBINDEX, TROPONINI in the last 168 hours. BNP (last 3 results) No results for input(s): PROBNP in the last 8760 hours. HbA1C: No results for input(s): HGBA1C in the last 72 hours. CBG: Recent Labs  Lab 10/19/21 1647  GLUCAP 120*   Lipid Profile: No results for input(s): CHOL, HDL, LDLCALC, TRIG, CHOLHDL, LDLDIRECT in the last 72 hours. Thyroid Function Tests: No results for input(s): TSH, T4TOTAL, FREET4, T3FREE, THYROIDAB in the last 72 hours. Anemia Panel: No results for input(s): VITAMINB12, FOLATE, FERRITIN, TIBC, IRON, RETICCTPCT in the last 72 hours. Sepsis Labs: Recent Labs  Lab 10/17/21 1915 10/17/21 2116  PROCALCITON 5.36  --   LATICACIDVEN 1.4 0.9    Recent Results (from the past 240 hour(s))  Resp Panel by RT-PCR (Flu A&B, Covid) Nasopharyngeal Swab     Status: None   Collection Time: 10/17/21  7:16 PM   Specimen: Nasopharyngeal Swab; Nasopharyngeal(NP) swabs in vial transport medium  Result Value Ref Range Status   SARS Coronavirus 2 by RT PCR NEGATIVE NEGATIVE Final    Comment: (NOTE) SARS-CoV-2 target nucleic acids are NOT DETECTED.  The SARS-CoV-2 RNA is generally detectable in upper respiratory specimens during the acute phase of infection. The lowest concentration of SARS-CoV-2 viral copies this assay can detect is 138 copies/mL. A negative result does not preclude SARS-Cov-2 infection and should not be used as the sole basis for treatment or other patient management decisions. A negative result may occur with  improper specimen collection/handling, submission of specimen other than nasopharyngeal swab, presence of viral  mutation(s) within the areas targeted by this assay, and inadequate number of viral copies(<138 copies/mL). A negative result must be combined with clinical observations, patient history, and epidemiological information. The expected result is Negative.  Fact Sheet for Patients:  EntrepreneurPulse.com.au  Fact Sheet for Healthcare Providers:  IncredibleEmployment.be  This test is no t yet approved or cleared by the [place] FDA and  has been authorized for detection and/or diagnosis of SARS-CoV-2 by FDA under an Emergency Use Authorization (EUA). This EUA will remain  in effect (meaning this test can be used) for the duration of the COVID-19 declaration under Section 564(b)(1) of the Act, 21 U.S.C.section 360bbb-3(b)(1), unless the authorization is terminated  or revoked sooner.       Influenza A by PCR NEGATIVE NEGATIVE Final   Influenza B by PCR NEGATIVE NEGATIVE Final    Comment: (NOTE)  The Xpert Xpress SARS-CoV-2/FLU/RSV plus assay is intended as an aid in the diagnosis of influenza from Nasopharyngeal swab specimens and should not be used as a sole basis for treatment. Nasal washings and aspirates are unacceptable for Xpert Xpress SARS-CoV-2/FLU/RSV testing.  Fact Sheet for Patients: EntrepreneurPulse.com.au  Fact Sheet for Healthcare Providers: IncredibleEmployment.be  This test is not yet approved or cleared by the [place] FDA and has been authorized for detection and/or diagnosis of SARS-CoV-2 by FDA under an Emergency Use Authorization (EUA). This EUA will remain in effect (meaning this test can be used) for the duration of the COVID-19 declaration under Section 564(b)(1) of the Act, 21 U.S.C. section 360bbb-3(b)(1), unless the authorization is terminated or revoked.  Performed at [facility], [address] [place], [place] [address]   Blood culture (routine x 2)      Status: Abnormal   Collection Time: 10/17/21  7:16 PM   Specimen: BLOOD  Result Value Ref Range Status   Specimen Description   Final    BLOOD LEFT WRIST Performed at [facility], [address], [place], [place] [address]    Special Requests   Final    BOTTLES DRAWN AEROBIC AND ANAEROBIC Blood Culture adequate volume Performed at [facility], [address] [place], [place] [address]    Culture  Setup Time   Final    GRAM POSITIVE COCCI IN BOTH AEROBIC AND ANAEROBIC BOTTLES CRITICAL RESULT CALLED TO, READ BACK BY AND VERIFIED WITH: Lonie Peak PHARMD 1422 10/18/21 HNM    Culture (A)  Final    STAPHYLOCOCCUS EPIDERMIDIS THE SIGNIFICANCE OF ISOLATING THIS ORGANISM FROM A SINGLE SET OF BLOOD CULTURES WHEN MULTIPLE SETS ARE DRAWN IS UNCERTAIN. PLEASE NOTIFY THE MICROBIOLOGY DEPARTMENT WITHIN ONE WEEK IF SPECIATION AND SENSITIVITIES ARE REQUIRED. Performed at [facility], [place] [address] [place], [place] [address]    Report Status 10/21/2021 FINAL  Final  Blood culture (routine x 2)     Status: None   Collection Time: 10/17/21  7:16 PM   Specimen: BLOOD  Result Value Ref Range Status   Specimen Description BLOOD RIGHT ARM  Final   Special Requests   Final    BOTTLES DRAWN AEROBIC AND ANAEROBIC Blood Culture results may not be optimal due to an inadequate volume of blood received in culture bottles   Culture   Final    NO GROWTH 5 DAYS Performed at [facility], [address] [place], [place] [address]    Report Status 10/22/2021 FINAL  Final  Blood Culture ID Panel (Reflexed)     Status: Abnormal   Collection Time: 10/17/21  7:16 PM  Result Value Ref Range Status   Enterococcus faecalis NOT DETECTED NOT DETECTED Final   Enterococcus Faecium NOT DETECTED NOT DETECTED Final   Listeria monocytogenes NOT DETECTED NOT DETECTED Final   Staphylococcus species DETECTED (A) NOT DETECTED Final    Comment: CRITICAL RESULT CALLED TO, READ BACK BY  AND VERIFIED WITH: Lonie Peak PHARMD 1422 10/18/21 HNM    Staphylococcus aureus (BCID) NOT DETECTED NOT DETECTED Final   Staphylococcus epidermidis DETECTED (A) NOT DETECTED Final    Comment: CRITICAL RESULT CALLED TO, READ BACK BY AND VERIFIED WITH: Lonie Peak PHARMD 1422 10/18/21 HNM    Staphylococcus lugdunensis NOT DETECTED NOT DETECTED Final   Streptococcus species NOT DETECTED NOT DETECTED Final   Streptococcus agalactiae NOT DETECTED NOT DETECTED Final   Streptococcus pneumoniae NOT DETECTED NOT DETECTED Final   Streptococcus  pyogenes NOT DETECTED NOT DETECTED Final   A.calcoaceticus-baumannii NOT DETECTED NOT DETECTED Final   Bacteroides fragilis NOT DETECTED NOT DETECTED Final   Enterobacterales NOT DETECTED NOT DETECTED Final   Enterobacter cloacae complex NOT DETECTED NOT DETECTED Final   Escherichia coli NOT DETECTED NOT DETECTED Final   Klebsiella aerogenes NOT DETECTED NOT DETECTED Final   Klebsiella oxytoca NOT DETECTED NOT DETECTED Final   Klebsiella pneumoniae NOT DETECTED NOT DETECTED Final   Proteus species NOT DETECTED NOT DETECTED Final   Salmonella species NOT DETECTED NOT DETECTED Final   Serratia marcescens NOT DETECTED NOT DETECTED Final   Haemophilus influenzae NOT DETECTED NOT DETECTED Final   Neisseria meningitidis NOT DETECTED NOT DETECTED Final   Pseudomonas aeruginosa NOT DETECTED NOT DETECTED Final   Stenotrophomonas maltophilia NOT DETECTED NOT DETECTED Final   Candida albicans NOT DETECTED NOT DETECTED Final   Candida auris NOT DETECTED NOT DETECTED Final   Candida glabrata NOT DETECTED NOT DETECTED Final   Candida krusei NOT DETECTED NOT DETECTED Final   Candida parapsilosis NOT DETECTED NOT DETECTED Final   Candida tropicalis NOT DETECTED NOT DETECTED Final   Cryptococcus neoformans/gattii NOT DETECTED NOT DETECTED Final   Methicillin resistance mecA/C NOT DETECTED NOT DETECTED Final    Comment: Performed at [facility], [address] [place], [place] [address]  CULTURE, BLOOD (ROUTINE X 2) w Reflex to ID Panel     Status: None   Collection Time: 10/18/21  7:05 PM   Specimen: BLOOD  Result Value Ref Range Status   Specimen Description BLOOD [facility]  Final   Special Requests BOTTLES DRAWN AEROBIC AND ANAEROBIC BCAV  Final   Culture   Final    NO GROWTH 5 DAYS Performed at [facility], [address] [place], [place] [address]    Report Status 10/23/2021 FINAL  Final  CULTURE, BLOOD (ROUTINE X 2) w Reflex to ID Panel     Status: None   Collection Time: 10/18/21  7:05 PM   Specimen: BLOOD  Result Value Ref Range Status   Specimen Description BLOOD LAC  Final   Special Requests BOTTLES DRAWN AEROBIC AND ANAEROBIC BCAV  Final   Culture   Final    NO GROWTH 5 DAYS Performed at [facility], [address], [place],  [address]    Report Status 10/23/2021 FINAL  Final         Radiology Studies: DG Abd Portable 1V  Result Date: 10/23/2021 CLINICAL DATA:  Nausea and vomiting.  Abdominal pain. EXAM: PORTABLE ABDOMEN - 1 VIEW COMPARISON:  CT abdomen 01/01/2018 FINDINGS: Mild scarring or subsegmental atelectasis peripherally in the left lower lobe. Thoracolumbar spondylosis. Suture material projects over the lower abdomen. [place] projects over the left abdomen. Bilateral hip prostheses. Upper normal amount of stool in the colon. No dilated small bowel identified. Right upper quadrant clips from prior cholecystectomy. IMPRESSION: 1. Unremarkable bowel gas pattern. 2. Mild atelectasis or scarring at the left lung base. Electronically Signed   By: Van Clines M.D.   On: 10/23/2021 15:26        Scheduled Meds:  amiodarone  200 mg Oral BID   apixaban  5 mg Oral BID   docusate sodium  200 mg Oral BID   DULoxetine  60 mg Oral BID   furosemide  20 mg Oral Daily   melatonin  5 mg Oral QHS   metoprolol succinate  25 mg Oral BID   mirtazapine  15 mg Oral QHS  mometasone-formoterol  2  puff Inhalation BID   multivitamin with minerals  1 tablet Oral Daily   tamsulosin  0.4 mg Oral QPC supper   Continuous Infusions:  sodium chloride     sodium chloride        LOS: 7 days    Time spent: 15 mins     Wyvonnia Dusky, MD [facility] Pager [phone]  If 7PM-7AM, please contact night-coverage 10/24/2021, 7:56 AM

## 2021-10-24 NOTE — Transfer of Care (Signed)
Immediate Anesthesia Transfer of Care Note ? ?Patient: Matthew [name] ? ?Procedure(s) Performed: TRANSESOPHAGEAL ECHOCARDIOGRAM (TEE) ?CARDIOVERSION ? ?Patient Location: Cath Lab ? ?Anesthesia Type:General ? ?Level of Consciousness: awake, alert  and oriented ? ?Airway & Oxygen Therapy: Patient Spontanous Breathing and Patient connected to face mask oxygen ? ?Post-op Assessment: Report given to RN and Post -op Vital signs reviewed and stable ? ?Post vital signs: Reviewed and stable ? ?Last Vitals:  ?Vitals Value Taken Time  ?BP 115/87 10/24/21 1303  ?Temp    ?Pulse 85 10/24/21 1304  ?Resp 20 10/24/21 1304  ?SpO2 96 % 10/24/21 1304  ?Vitals shown include unvalidated device data. ? ?Last Pain:  ?Vitals:  ? 10/24/21 1212  ?TempSrc: Oral  ?PainSc: 0-No pain  ?   ? ?Patients Stated Pain Goal: 0 (10/22/21 6384) ? ?Complications: No notable events documented. ?

## 2021-10-24 NOTE — Progress Notes (Signed)
*  PRELIMINARY RESULTS* ?Echocardiogram ?2D Echocardiogram has been performed. ? ?Matthew [name], [name] ?10/24/2021, 1:03 PM ?

## 2021-10-24 NOTE — Consult Note (Signed)
?  Amiodarone Drug - Drug Interaction Consult Note ? ?Recommendations: ?No recommendations at this time. Continue to monitor for DDI listed below.  ? ?Amiodarone is metabolized by the cytochrome P450 system and therefore has the potential to cause many drug interactions. Amiodarone has an average plasma half-life of 50 days (range 20 to 100 days).  ? ?There is potential for drug interactions to occur several weeks or months after stopping treatment and the onset of drug interactions may be slow after initiating amiodarone.  ? ? ?'[x]'$  Anticoagulants: Amiodarone can increase anticoagulant effect. Patients should be monitored closely and the dose of anticoagulant altered accordingly, remembering that amiodarone levels take several weeks to stabilize. ?Patient was on apixaban PTA. Amiodarone may increase the serum concentration and anticoagulation effects of apixaban. Continue to monitor H&H daily.  ? ?'[x]'$  Beta blockers: increased risk of bradycardia, AV block and myocardial depression.  ?Patient was on lopressor pta now on toprol xl. Continue to monitor EKG changes and heart rate ? ?'[x]'$  Diuretics: increased risk of cardiotoxicity if hypokalemia occurs. ?Patient is on furosemide '20mg'$  daily. Continue to monitor electrolytes with BMP daily.  ? ?'[x]'$  Drugs that prolong the QT interval:  Torsades de pointes risk may be increased with concurrent use - avoid if possible.  Monitor QTc, also keep magnesium/potassium WNL if concurrent therapy can't be avoided. ? ? ?Thank You,  ?Darrick Penna, PharmD, MS PGPM ?Clinical Pharmacist ?10/24/2021 ?4:48 PM ? ? ? ?  ?

## 2021-10-24 NOTE — Progress Notes (Signed)
Nutrition Follow-up ? ?DOCUMENTATION CODES:  ? ?Obesity unspecified ? ?INTERVENTION:  ? ?-Glucerna Shake po BID, each supplement provides 220 kcal and 10 grams of protein  ?-Ensure Max po daily, each supplement provides 150 kcal and 30 grams of protein ?-MVI with minerals daily ?-D/c double protein portions ? ?NUTRITION DIAGNOSIS:  ? ?Increased nutrient needs related to chronic illness (COPD) as evidenced by estimated needs. ? ?Ongoing ? ?GOAL:  ? ?Patient will meet greater than or equal to 90% of their needs ? ?Progressing  ? ?MONITOR:  ? ?PO intake, Supplement acceptance, Labs, Weight trends, Skin, I & O's ? ?REASON FOR ASSESSMENT:  ? ?Consult ?Assessment of nutrition requirement/status ? ?ASSESSMENT:  ? ?Matthew [name] is a 71 y.o. male with medical history significant for COPD, OSA not on CPAP, prior home O2 use, class IV obesity, CKD stage IV, HTN, A-fib on Eliquis, diastolic CHF with last exacerbation in October 2022, who presents to the ED by EMS with respiratory distress, arriving on CPAP.  Patient states he was in his usual state of health until the day prior when he started having shortness of breath which significantly worsened over the next 24 hours.  He has associated nonradiating retrosternal tightness, worse with breathing.  Denies lower extremity edema.  Has some orthopnea.  Has a nonproductive cough but no fever or chills.  No affected contacts ? ?3/13- s/p TEE, unsuccessful cardioversion ? ?Reviewed I/O's: -930 ml x 24 hours and -5.9 L since admission ? ?UOP: 1.7 L x 24 hours ? ?Pt continues with good appetite. Noted meal completions 100%.  ? ?Received notification from nutritional services staff that pt is often exceeding sodium limit with meals secondary to double protein order. RD modified order and added supplements to help pt meet his needs.  ?  ?Medications reviewed and include 0.9% sodium chloride infusion @ 20 ml/hr.  ? ?Labs reviewed: CBGS: 120.  ? ?Diet Order:   ?Diet Order   ? ?       ?   Diet 2 gram sodium Room service appropriate? Yes; Fluid consistency: Thin  Diet effective now       ?  ? ?  ?  ? ?  ? ? ?EDUCATION NEEDS:  ? ?No education needs have been identified at this time ? ?Skin:  Skin Assessment: Skin Integrity Issues: ?Skin Integrity Issues:: Other (Comment) ?Other: laceration to rt ear ? ?Last BM:  10/23/21 ? ?Height:  ? ?Ht Readings from Last 1 Encounters:  ?10/24/21 '6\' 2"'$  (1.88 m)  ? ? ?Weight:  ? ?Wt Readings from Last 1 Encounters:  ?10/24/21 127 kg  ? ?BMI:  Body mass index is 35.95 kg/m?. ? ?Estimated Nutritional Needs:  ? ?Kcal:  2200-2400 ? ?Protein:  120-130 grams ? ?Fluid:  > 2 L ? ? ? ?Loistine Chance, RD, LDN, CDCES ?Registered Dietitian II ?Certified Diabetes Care and Education Specialist ?Please refer to [facility] for RD and/or RD on-call/weekend/after hours pager  ?

## 2021-10-24 NOTE — Progress Notes (Signed)
OT Cancellation Note ? ?Patient Details ?Name: Matthew [name] ?MRN: [id number] ?DOB: August 05, 1951 ? ? ?Cancelled Treatment:    Reason Eval/Treat Not Completed: Patient at procedure or test/ unavailable. Pt out of the room upon attempt. Will re-attempt at later date/time as pt is available and appropriate.  ? ?Ardeth Perfect., MPH, MS, OTR/L ?ascom [phone] ?10/24/21, 1:27 PM ? ?

## 2021-10-24 NOTE — Progress Notes (Signed)
? ?Progress Note ? ?Patient Name: Matthew [name] ?Date of Encounter: 10/24/2021 ? ?[facility] Cardiologist: Nelva Bush, MD  ? ?Subjective  ? ?The patient underwent TEE today that showed no intracardiac thrombus.  Cardioversion was performed but was not successful.  He continues to be in atrial fibrillation with mild tachycardia.  He denies chest pain or significant dyspnea. ? ?Inpatient Medications  ?  ?Scheduled Meds: ? amiodarone  150 mg Intravenous Once  ? apixaban  5 mg Oral BID  ? docusate sodium  200 mg Oral BID  ? DULoxetine  60 mg Oral BID  ? feeding supplement (GLUCERNA SHAKE)  237 mL Oral BID BM  ? furosemide  20 mg Oral Daily  ? lidocaine      ? melatonin  5 mg Oral QHS  ? metoprolol succinate  25 mg Oral BID  ? mirtazapine  15 mg Oral QHS  ? mometasone-formoterol  2 puff Inhalation BID  ? multivitamin with minerals  1 tablet Oral Daily  ? Ensure Max Protein  11 oz Oral QHS  ? tamsulosin  0.4 mg Oral QPC supper  ? ?Continuous Infusions: ? sodium chloride    ? sodium chloride    ? amiodarone    ? Followed by  ? amiodarone    ? ?PRN Meds: ?acetaminophen, levalbuterol, ondansetron (ZOFRAN) IV, traMADol  ? ?Vital Signs  ?  ?Vitals:  ? 10/24/21 1315 10/24/21 1332 10/24/21 1345 10/24/21 1554  ?BP: 100/70 100/81 107/86 (!) 152/128  ?Pulse: (!) 123 68 (!) 109 (!) 46  ?Resp: (!) 24 (!) 21 (!) 27 18  ?Temp:    98 ?F (36.7 ?C)  ?TempSrc:      ?SpO2: 97% 97% 100% 94%  ?Weight:      ?Height:      ? ? ?Intake/Output Summary (Last 24 hours) at 10/24/2021 1604 ?Last data filed at 10/24/2021 1304 ?Gross per 24 hour  ?Intake 540 ml  ?Output 850 ml  ?Net -310 ml  ? ? ?Last 3 Weights 10/24/2021 10/24/2021 10/23/2021  ?Weight (lbs) 280 lb 279 lb 1.6 oz 279 lb 15.8 oz  ?Weight (kg) 127.007 kg 126.6 kg 127 kg  ?   ? ?Telemetry  ?  ?Atrial fibrillation.  Rate 90s-100s.  - Personally Reviewed ? ?ECG  ?  ?N/a - Personally Reviewed ? ?Physical Exam  ? ?VS:  BP (!) 152/128 (BP Location: Left Arm)   Pulse (!) 46   Temp 98 ?F  (36.7 ?C)   Resp 18   Ht '6\' 2"'$  (1.88 m)   Wt 127 kg   SpO2 94%   BMI 35.95 kg/m?  , BMI Body mass index is 35.95 kg/m?. ?GENERAL:  Well appearing ?HEENT: Pupils equal round and reactive, fundi not visualized, oral mucosa unremarkable ?NECK: Unable to assess JVP.  No bruits, no thyromegaly ?LUNGS:  Clear to auscultation bilaterally ?HEART: Tachycardic.  Irregularly irregular.  PMI not displaced or sustained,S1 and S2 within normal limits, no S3, no S4, no clicks, no rubs, no murmurs ?ABD:  Flat, positive bowel sounds normal in frequency in pitch, no bruits, no rebound, no guarding, no midline pulsatile mass, no hepatomegaly, no splenomegaly ?EXT:  2 plus pulses throughout, 1+ LE edema, no cyanosis no clubbing ?SKIN:  No rashes no nodules ?NEURO:  Cranial nerves II through XII grossly intact, motor grossly intact throughout ?PSYCH:  Cognitively intact, oriented to person place and time ? ? ?Labs  ?  ?High Sensitivity Troponin:   ?Recent Labs  ?Lab 10/17/21 ?[place]  10/17/21 ?2116  ?TROPONINIHS 15 13  ? ?   ?Chemistry ?Recent Labs  ?Lab 10/17/21 ?1915 10/18/21 ?1004 10/22/21 ?6160 10/23/21 ?7371 10/24/21 ?0512  ?NA 139   < > 136 137 138  ?K 4.4   < > 4.5 4.7 4.9  ?CL 106   < > 100 102 100  ?CO2 23   < > '29 30 28  '$ ?GLUCOSE 120*   < > 122* 112* 95  ?BUN 30*   < > 53* 60* 58*  ?CREATININE 2.45*   < > 2.97* 3.20* 2.74*  ?CALCIUM 9.7   < > 9.0 9.1 9.6  ?PROT 7.3  --   --   --   --   ?ALBUMIN 2.4*  --   --   --   --   ?AST 12*  --   --   --   --   ?ALT 8  --   --   --   --   ?ALKPHOS 58  --   --   --   --   ?BILITOT 0.6  --   --   --   --   ?GFRNONAA 27*   < > 22* 20* 24*  ?ANIONGAP 10   < > '7 5 10  '$ ? < > = values in this interval not displayed.  ? ?  ?Lipids No results for input(s): CHOL, TRIG, HDL, LABVLDL, LDLCALC, CHOLHDL in the last 168 hours.  ?Hematology ?Recent Labs  ?Lab 10/22/21 ?0626 10/23/21 ?9485 10/24/21 ?4627  ?WBC 4.3 4.3 4.7  ?RBC 3.00* 3.01* 3.09*  ?HGB 8.0* 8.1* 8.3*  ?HCT 28.0* 28.3* 29.6*  ?MCV 93.3  94.0 95.8  ?MCH 26.7 26.9 26.9  ?MCHC 28.6* 28.6* 28.0*  ?RDW 14.4 14.6 14.8  ?PLT 267 261 294  ? ? ?Thyroid No results for input(s): TSH, FREET4 in the last 168 hours.  ?BNP ?Recent Labs  ?Lab 10/17/21 ?1916  ?BNP 691.7*  ? ?  ?DDimer No results for input(s): DDIMER in the last 168 hours.  ? ?Radiology  ?  ?DG Abd Portable 1V ? ?Result Date: 10/23/2021 ?CLINICAL DATA:  Nausea and vomiting.  Abdominal pain. EXAM: PORTABLE ABDOMEN - 1 VIEW COMPARISON:  CT abdomen 01/01/2018 FINDINGS: Mild scarring or subsegmental atelectasis peripherally in the left lower lobe. Thoracolumbar spondylosis. Suture material projects over the lower abdomen. [place] projects over the left abdomen. Bilateral hip prostheses. Upper normal amount of stool in the colon. No dilated small bowel identified. Right upper quadrant clips from prior cholecystectomy. IMPRESSION: 1. Unremarkable bowel gas pattern. 2. Mild atelectasis or scarring at the left lung base. Electronically Signed   By: Van Clines M.D.   On: 10/23/2021 15:26   ? ?Cardiac Studies  ? ?Echo 10/18/21: ? 1. Left ventricular ejection fraction, by estimation, is 35 to 40%. The  ?left ventricle has moderately decreased function. Left ventricular  ?endocardial border not optimally defined to evaluate regional wall motion.  ?The left ventricular internal cavity  ?size was mildly to moderately dilated. There is moderate left ventricular  ?hypertrophy. Left ventricular diastolic function could not be evaluated.  ? 2. Right ventricular systolic function is normal. The right ventricular  ?size is mildly enlarged. Mildly increased right ventricular wall  ?thickness.  ? 3. Left atrial size was mildly dilated.  ? 4. Right atrial size was mildly dilated.  ? 5. The mitral valve was not well visualized. Mild mitral valve  ?regurgitation.  ? 6. The aortic valve is tricuspid. Aortic valve regurgitation is not  ?  visualized. No aortic stenosis is present.  ? 7. The inferior vena cava is dilated  in size with >50% respiratory  ?variability, suggesting right atrial pressure of 8 mmHg.  ? ?[name] 10/17/2018: ?No CAD. ? ?Patient Profile  ?   ?71 y.o. male with chronic systolic and diastolic heart failure, persistent atrial fibrillation, asthma/COPD, CKD 3/4, anemia, disease, and obesity admitted with A-fib with RVR.  He was admitted with a COPD exacerbation requiring BiPAP. ? ?Assessment & Plan  ?  ?# Persistent atrial fibrillation:  ?He underwent TEE and cardioversion today.  Cardioversion was not successful in converting him to sinus rhythm.  Ventricular rate is ranging from 100 to 120 bpm.  Not able to increase the dose of Toprol given relatively low blood pressure.  He was on oral amiodarone that was started on Friday.  I elected to switch him to IV amiodarone bolus and a drip with the hope of possibly chemically converting him to sinus rhythm.  If he does not convert, recommend proceeding with repeat cardioversion on Wednesday.   ? ?#Acute on chronic systolic and diastolic heart failure: ?LVEF this admission 35 to 40%.  Thought to be nonischemic.  He had a left heart cath in 2020 that revealed no coronary disease.  Continue metoprolol succinate.  Not able to add GDMT at the present time given low blood pressure.  ?He appears to be euvolemic on small dose furosemide. ? ?#COPD exacerbation: ?Lungs are sounding better now.  Managed by IM. ? ?#CKD 4: ?Creatinine has ranged from 2.4-3.2.  Continue Lasix 20 mg daily. ? ?#Normocytic anemia: ?Prior GI bleed.  He has declined invasive work-up.  H/H is stable on Eliquis.  Consider [place] as an outpatient.  ? ?   ? ?For questions or updates, please contact [place] ?Please consult [email or web address] for contact info under  ? ?  ?   ?Signed, ?Kathlyn Sacramento, MD  ?10/24/2021, 4:04 PM   ? ?

## 2021-10-24 NOTE — TOC Progression Note (Signed)
Transition of Care (TOC) - Progression Note  ? ? ?Patient Details  ?Name: Matthew [name] ?MRN: [id number] ?Date of Birth: 1951-02-25 ? ?Transition of Care (TOC) CM/SW Contact  ?Alberteen Sam, LCSW ?Phone Number: ?10/24/2021, 11:56 AM ? ?Clinical Narrative:    ? ?Per Andee Poles with [facility], [facility] is going to be delivered today.  ? ?Patient Is set up with [facility] for RN.  ? ?TOC continuing to follow for needs.  ? ?Expected Discharge Plan: Home/Self Care ?Barriers to Discharge: Continued Medical Work up ? ?Expected Discharge Plan and Services ?Expected Discharge Plan: Home/Self Care ?  ?Discharge Planning Services: CM Consult ?  ?Living arrangements for the past 2 months: [place] ?                ?DME Arranged: N/A ?DME Agency: NA ?  ?  ?  ?  ?  ?  ?  ?  ? ? ?Social Determinants of Health (SDOH) Interventions ?  ? ?Readmission Risk Interventions ?Readmission Risk Prevention Plan 10/18/2021  ?Transportation Screening Complete  ?Medication Review Press photographer) Complete  ?PCP or Specialist appointment within 3-5 days of discharge Complete  ?[place] or Home Care Consult Complete  ?SW Recovery Care/Counseling Consult Complete  ?Palliative Care Screening Not Applicable  ?[place] Not Applicable  ?Some recent data might be hidden  ? ? ?

## 2021-10-24 NOTE — Progress Notes (Signed)
PT Cancellation Note ? ?Patient Details ?Name: Matthew [name] ?MRN: [id number] ?DOB: Dec 28, 1950 ? ? ?Cancelled Treatment:    Reason Eval/Treat Not Completed: Patient at procedure or test/unavailable.  Chart reviewed.  Pt currently off floor at procedure.  Will re-attempt PT session at a later date/time. ? ?Leitha Bleak, PT ?10/24/21, 1:44 PM ? ?

## 2021-10-24 NOTE — Procedures (Signed)
Transesophageal Echocardiogram : ? ?Indication: atrial fibrillation, precardioversion ?Requesting/ordering  physician:  ? ?Procedure: Benzocaine spray x2 and 2 mls x 2 of viscous lidocaine were given orally to provide local anesthesia to the oropharynx. The patient was positioned supine on the left side, bite block provided. The patient was moderately sedated per anesthesia team  Using digital technique an omniplane probe was advanced into the esophagus without incident.  ? ? ?See report in EPIC  for complete details: ?In brief, imaging revealed normal LV function with no RWMAs and no mural apical thrombus.  .  Estimated ejection fraction was 35%.  Right sided cardiac chambers were normal with no evidence of pulmonary hypertension. ? ?Imaging of the septum showed no ASD or VSD ?2D and color flow confirmed no PFO ? ?The LA was well visualized in orthogonal views.  There was no spontaneous contrast and no thrombus in the LA and LA appendage  ? ?The descending thoracic aorta had no  mural aortic debris with no evidence of aneurysmal dilation or disection ? ?Cardioversion procedure note ?For atrial fibrillation. ? ?Procedure Details: ? ?Consent: Risks of procedure as well as the alternatives and risks of each were explained to the (patient/caregiver).  Consent for procedure obtained. ? ?Time Out: Verified patient identification, verified procedure, site/side was marked, verified correct patient position, special equipment/implants available, medications/allergies/relevent history reviewed, required imaging and test results available.  Performed ? ?Patient placed on cardiac monitor, pulse oximetry, supplemental oxygen as necessary.   ?Sedation given: propofol IV, per anesthesia team ?Pacer pads placed anterior and posterior chest. ? ? ?Cardioverted 2 time(s).   ?Cardioverted at  200J. Synchronized biphasic ?Unsuccessful. Ekg shows persistent afib ? ?Evaluation: ?Findings: Post procedure EKG shows: atrial fibrillation  (cardioversion was unsuccessful). ?Complications: None ?Patient did tolerate procedure well. ?Consider EP input regarding afib/rhythm control management. ? ?Time Spent Directly with the Patient: ? ?45 minutes  ? ?Kate Sable, M.D.  ? ? ?Aaron Edelman Ag[place]-Etang ?10/24/2021 ?1:01 PM  ?

## 2021-10-24 NOTE — Progress Notes (Signed)
[facility]  ROUNDING NOTE   Subjective:   Matthew [name] is a 71 year old male with past medical conditions including obesity, A-fib on Eliquis, diastolic heart failure, COPD with as needed O2 use, and chronic kidney disease stage IV.  Patient presents to the emergency department with complaints of shortness of breath.  Patient was admitted for Rapid atrial fibrillation ([place]) [I48.91] Atrial fibrillation with rapid ventricular response (H[facility]) [I48.91] Acute respiratory failure with hypoxemia ([place]) [J96.01] Patient is known to our practice from previous admissions.  Patient was referred to our office for outpatient follow-up and has not yet scheduled an appointment.    Daily update: Patiently currently resting quietly Currently n.p.o. for procedure Denies shortness of breath Denies pain or discomfort    Objective:  Vital signs in last 24 hours:  Temp:  [97.6 F (36.4 C)-98.1 F (36.7 C)] 98 F (36.7 C) (03/13 1212) Pulse Rate:  [39-123] 109 (03/13 1345) Resp:  [12-29] 27 (03/13 1345) BP: (71-117)/(49-87) 107/86 (03/13 1345) SpO2:  [92 %-100 %] 100 % (03/13 1345) Weight:  [126.6 kg-127 kg] 127 kg (03/13 1212)  Weight change: -0.4 kg Filed Weights   10/23/21 0557 10/24/21 0416 10/24/21 1212  Weight: 127 kg 126.6 kg 127 kg    Intake/Output: I/O last 3 completed shifts: In: 1200 [P.O.:1200] Out: 2325 [Urine:2325]   Intake/Output this shift:  Total I/O In: 300 [I.V.:300] Out: -   Physical Exam: General: No acute distress  Head: Moist oral mucosal membranes  Eyes: Anicteric  Lungs:  Clear bilateral, normal effort  Heart: Irregular, no rubs  Abdomen:  Soft, nontender, obese  Extremities: No peripheral edema.  Neurologic: Awake, alert,oriented  Skin: No acute lesions or rashes       Basic Metabolic Panel: Recent Labs  Lab 10/20/21 0545 10/21/21 0607 10/22/21 0509 10/23/21 0509 10/24/21 0512  NA 136 135 136 137 138  K 3.9 4.3 4.5 4.7 4.9  CL  100 102 100 102 100  CO2 '25 29 29 30 28  '$ GLUCOSE 112* 99 122* 112* 95  BUN 40* 47* 53* 60* 58*  CREATININE 2.96* 3.14* 2.97* 3.20* 2.74*  CALCIUM 9.4 8.9 9.0 9.1 9.6     Liver Function Tests: Recent Labs  Lab 10/17/21 1915  AST 12*  ALT 8  ALKPHOS 58  BILITOT 0.6  PROT 7.3  ALBUMIN 2.4*    No results for input(s): LIPASE, AMYLASE in the last 168 hours. No results for input(s): AMMONIA in the last 168 hours.  CBC: Recent Labs  Lab 10/17/21 1915 10/19/21 0707 10/20/21 0545 10/21/21 0607 10/22/21 0509 10/23/21 0509 10/24/21 0513  WBC 8.0   < > 4.6 3.6* 4.3 4.3 4.7  NEUTROABS 5.8  --   --   --   --   --   --   HGB 10.1*   < > 8.1* 8.0* 8.0* 8.1* 8.3*  HCT 36.6*   < > 27.9* 28.1* 28.0* 28.3* 29.6*  MCV 97.1   < > 93.3 94.6 93.3 94.0 95.8  PLT 333   < > 249 247 267 261 294   < > = values in this interval not displayed.     Cardiac Enzymes: No results for input(s): CKTOTAL, CKMB, CKMBINDEX, TROPONINI in the last 168 hours.  BNP: Invalid input(s): POCBNP  CBG: Recent Labs  Lab 10/19/21 1647  GLUCAP 120*     Microbiology: Results for orders placed or performed during the hospital encounter of 10/17/21  Resp Panel by RT-PCR (Flu A&B, Covid) Nasopharyngeal  Swab     Status: None   Collection Time: 10/17/21  7:16 PM   Specimen: Nasopharyngeal Swab; Nasopharyngeal(NP) swabs in vial transport medium  Result Value Ref Range Status   SARS Coronavirus 2 by RT PCR NEGATIVE NEGATIVE Final    Comment: (NOTE) SARS-CoV-2 target nucleic acids are NOT DETECTED.  The SARS-CoV-2 RNA is generally detectable in upper respiratory specimens during the acute phase of infection. The lowest concentration of SARS-CoV-2 viral copies this assay can detect is 138 copies/mL. A negative result does not preclude SARS-Cov-2 infection and should not be used as the sole basis for treatment or other patient management decisions. A negative result may occur with  improper specimen  collection/handling, submission of specimen other than nasopharyngeal swab, presence of viral mutation(s) within the areas targeted by this assay, and inadequate number of viral copies(<138 copies/mL). A negative result must be combined with clinical observations, patient history, and epidemiological information. The expected result is Negative.  Fact Sheet for Patients:  EntrepreneurPulse.com.au  Fact Sheet for Healthcare Providers:  IncredibleEmployment.be  This test is no t yet approved or cleared by the [place] FDA and  has been authorized for detection and/or diagnosis of SARS-CoV-2 by FDA under an Emergency Use Authorization (EUA). This EUA will remain  in effect (meaning this test can be used) for the duration of the COVID-19 declaration under Section 564(b)(1) of the Act, 21 U.S.C.section 360bbb-3(b)(1), unless the authorization is terminated  or revoked sooner.       Influenza A by PCR NEGATIVE NEGATIVE Final   Influenza B by PCR NEGATIVE NEGATIVE Final    Comment: (NOTE) The Xpert Xpress SARS-CoV-2/FLU/RSV plus assay is intended as an aid in the diagnosis of influenza from Nasopharyngeal swab specimens and should not be used as a sole basis for treatment. Nasal washings and aspirates are unacceptable for Xpert Xpress SARS-CoV-2/FLU/RSV testing.  Fact Sheet for Patients: EntrepreneurPulse.com.au  Fact Sheet for Healthcare Providers: IncredibleEmployment.be  This test is not yet approved or cleared by the [place] FDA and has been authorized for detection and/or diagnosis of SARS-CoV-2 by FDA under an Emergency Use Authorization (EUA). This EUA will remain in effect (meaning this test can be used) for the duration of the COVID-19 declaration under Section 564(b)(1) of the Act, 21 U.S.C. section 360bbb-3(b)(1), unless the authorization is terminated or revoked.  Performed at [facility], [address] [place], [place] [address]   Blood culture (routine x 2)     Status: Abnormal   Collection Time: 10/17/21  7:16 PM   Specimen: BLOOD  Result Value Ref Range Status   Specimen Description   Final    BLOOD LEFT WRIST Performed at [facility], [address], [place], [place] [address]    Special Requests   Final    BOTTLES DRAWN AEROBIC AND ANAEROBIC Blood Culture adequate volume Performed at [facility], [address] [place], [place] [address]    Culture  Setup Time   Final    GRAM POSITIVE COCCI IN BOTH AEROBIC AND ANAEROBIC BOTTLES CRITICAL RESULT CALLED TO, READ BACK BY AND VERIFIED WITH: Lonie Peak PHARMD 1422 10/18/21 HNM    Culture (A)  Final    STAPHYLOCOCCUS EPIDERMIDIS THE SIGNIFICANCE OF ISOLATING THIS ORGANISM FROM A SINGLE SET OF BLOOD CULTURES WHEN MULTIPLE SETS ARE DRAWN IS UNCERTAIN. PLEASE NOTIFY THE MICROBIOLOGY DEPARTMENT WITHIN ONE WEEK IF SPECIATION AND SENSITIVITIES ARE REQUIRED. Performed at [facility], [place] [address] [place], [place]  [address]    Report Status 10/21/2021 FINAL  Final  Blood culture (routine x 2)     Status: None   Collection Time: 10/17/21  7:16 PM   Specimen: BLOOD  Result Value Ref Range Status   Specimen Description BLOOD RIGHT ARM  Final   Special Requests   Final    BOTTLES DRAWN AEROBIC AND ANAEROBIC Blood Culture results may not be optimal due to an inadequate volume of blood received in culture bottles   Culture   Final    NO GROWTH 5 DAYS Performed at [facility], [address] [place], [place] [address]    Report Status 10/22/2021 FINAL  Final  Blood Culture ID Panel (Reflexed)     Status: Abnormal   Collection Time: 10/17/21  7:16 PM  Result Value Ref Range Status   Enterococcus faecalis NOT DETECTED NOT DETECTED Final   Enterococcus Faecium NOT DETECTED NOT DETECTED Final   Listeria monocytogenes NOT DETECTED NOT DETECTED Final   Staphylococcus  species DETECTED (A) NOT DETECTED Final    Comment: CRITICAL RESULT CALLED TO, READ BACK BY AND VERIFIED WITH: Lonie Peak PHARMD 1422 10/18/21 HNM    Staphylococcus aureus (BCID) NOT DETECTED NOT DETECTED Final   Staphylococcus epidermidis DETECTED (A) NOT DETECTED Final    Comment: CRITICAL RESULT CALLED TO, READ BACK BY AND VERIFIED WITH: Lonie Peak [id number] [id number] 10/18/21 HNM    Staphylococcus lugdunensis NOT DETECTED NOT DETECTED Final   Streptococcus species NOT DETECTED NOT DETECTED Final   Streptococcus agalactiae NOT DETECTED NOT DETECTED Final   Streptococcus pneumoniae NOT DETECTED NOT DETECTED Final   Streptococcus pyogenes NOT DETECTED NOT DETECTED Final   A.calcoaceticus-baumannii NOT DETECTED NOT DETECTED Final   Bacteroides fragilis NOT DETECTED NOT DETECTED Final   Enterobacterales NOT DETECTED NOT DETECTED Final   Enterobacter cloacae complex NOT DETECTED NOT DETECTED Final   Escherichia coli NOT DETECTED NOT DETECTED Final   Klebsiella aerogenes NOT DETECTED NOT DETECTED Final   Klebsiella oxytoca NOT DETECTED NOT DETECTED Final   Klebsiella pneumoniae NOT DETECTED NOT DETECTED Final   Proteus species NOT DETECTED NOT DETECTED Final   Salmonella species NOT DETECTED NOT DETECTED Final   Serratia marcescens NOT DETECTED NOT DETECTED Final   Haemophilus influenzae NOT DETECTED NOT DETECTED Final   Neisseria meningitidis NOT DETECTED NOT DETECTED Final   Pseudomonas aeruginosa NOT DETECTED NOT DETECTED Final   Stenotrophomonas maltophilia NOT DETECTED NOT DETECTED Final   Candida albicans NOT DETECTED NOT DETECTED Final   Candida auris NOT DETECTED NOT DETECTED Final   Candida glabrata NOT DETECTED NOT DETECTED Final   Candida krusei NOT DETECTED NOT DETECTED Final   Candida parapsilosis NOT DETECTED NOT DETECTED Final   Candida tropicalis NOT DETECTED NOT DETECTED Final   Cryptococcus neoformans/gattii NOT DETECTED NOT DETECTED Final   Methicillin resistance mecA/C  NOT DETECTED NOT DETECTED Final    Comment: Performed at [facility], [address] [place], [place] [address]  CULTURE, BLOOD (ROUTINE X 2) w Reflex to ID Panel     Status: None   Collection Time: 10/18/21  7:05 PM   Specimen: BLOOD  Result Value Ref Range Status   Specimen Description BLOOD [facility]  Final   Special Requests BOTTLES DRAWN AEROBIC AND ANAEROBIC BCAV  Final   Culture   Final    NO GROWTH 5 DAYS Performed at [facility], [address] [place], [place] [address]    Report Status 10/23/2021 FINAL  Final  CULTURE,  BLOOD (ROUTINE X 2) w Reflex to ID Panel     Status: None   Collection Time: 10/18/21  7:05 PM   Specimen: BLOOD  Result Value Ref Range Status   Specimen Description BLOOD LAC  Final   Special Requests BOTTLES DRAWN AEROBIC AND ANAEROBIC BCAV  Final   Culture   Final    NO GROWTH 5 DAYS Performed at [facility], [address] [place], [place] [address]    Report Status 10/23/2021 FINAL  Final    Coagulation Studies: No results for input(s): LABPROT, INR in the last 72 hours.  Urinalysis: No results for input(s): COLORURINE, LABSPEC, PHURINE, GLUCOSEU, HGBUR, BILIRUBINUR, KETONESUR, PROTEINUR, UROBILINOGEN, NITRITE, LEUKOCYTESUR in the last 72 hours.  Invalid input(s): APPERANCEUR    Imaging: DG Abd Portable 1V  Result Date: 10/23/2021 CLINICAL DATA:  Nausea and vomiting.  Abdominal pain. EXAM: PORTABLE ABDOMEN - 1 VIEW COMPARISON:  CT abdomen 01/01/2018 FINDINGS: Mild scarring or subsegmental atelectasis peripherally in the left lower lobe. Thoracolumbar spondylosis. Suture material projects over the lower abdomen. [place] projects over the left abdomen. Bilateral hip prostheses. Upper normal amount of stool in the colon. No dilated small bowel identified. Right upper quadrant clips from prior cholecystectomy. IMPRESSION: 1. Unremarkable bowel gas pattern. 2. Mild atelectasis or scarring at the left lung base.  Electronically Signed   By: Van Clines M.D.   On: 10/23/2021 15:26     Medications:    sodium chloride     sodium chloride      amiodarone  200 mg Oral BID   apixaban  5 mg Oral BID   docusate sodium  200 mg Oral BID   DULoxetine  60 mg Oral BID   furosemide  20 mg Oral Daily   lidocaine       melatonin  5 mg Oral QHS   metoprolol succinate  25 mg Oral BID   mirtazapine  15 mg Oral QHS   mometasone-formoterol  2 puff Inhalation BID   multivitamin with minerals  1 tablet Oral Daily   tamsulosin  0.4 mg Oral QPC supper   acetaminophen, levalbuterol, ondansetron (ZOFRAN) IV, traMADol  Assessment/ Plan:  [name] is a 71 y.o.  male past medical conditions including obesity, A-fib on Eliquis, systolic heart failure, COPD with as needed O2 use, and chronic kidney disease stage IV.  Patient presents to the emergency department with complaints of shortness of breath.  Patient was admitted for Rapid atrial fibrillation ([place]) [I48.91] Atrial fibrillation with rapid ventricular response (HCC) [I48.91] Acute respiratory failure with hypoxemia (HCC) [J96.01]   Acute Kidney Injury on chronic kidney disease stage IV with baseline creatinine 2.35 and GFR of 27 on 10/17/21.  Acute kidney injury secondary to hypervolemic state and diuresis Chronic kidney disease is secondary to hypertension and chronic heart failure No IV contrast exposure   Lab Results  Component Value Date   CREATININE 2.74 (H) 10/24/2021   CREATININE 3.20 (H) 10/23/2021   CREATININE 2.97 (H) 10/22/2021    Intake/Output Summary (Last 24 hours) at 10/24/2021 1409 Last data filed at 10/24/2021 1304 Gross per 24 hour  Intake 780 ml  Output 1300 ml  Net -520 ml   Creatinine slightly improved today to 2.7.  Urine output recorded of 1.6 L in preceding 24 hours.  Continue low-dose diuresis.  We will continue to monitor  2.  Hypotension with history of hypertension with chronic kidney disease as outpatient.  Home regimen includes furosemide, metoprolol, and diltiazem.  Also prescribed tamsulosin.   Receiving Furosemide and Metoprolol Succinate. Blood pressure stable, 107/86.  3.  Chronic systolic heart failure.  Echo from 10/18/2021 shows EF 35 to 40% with moderate LVH. Continue furosemide 20 mg daily.  4.  Atrial fibrillation with history of rapid RVR.  Remains on metoprolol.  Cardiology planned cardioversion today.  Cardioversion unsuccessful.  Patient remains in atrial fibrillation.   LOS: 7   3/13/20232:09 PM

## 2021-10-25 ENCOUNTER — Encounter: Payer: Self-pay | Admitting: Cardiology

## 2021-10-25 DIAGNOSIS — N184 Chronic kidney disease, stage 4 (severe): Secondary | ICD-10-CM | POA: Diagnosis not present

## 2021-10-25 DIAGNOSIS — I4891 Unspecified atrial fibrillation: Secondary | ICD-10-CM | POA: Diagnosis not present

## 2021-10-25 DIAGNOSIS — E669 Obesity, unspecified: Secondary | ICD-10-CM | POA: Diagnosis not present

## 2021-10-25 LAB — CBC
HCT: 30.6 % — ABNORMAL LOW (ref 39.0–52.0)
Hemoglobin: 8.6 g/dL — ABNORMAL LOW (ref 13.0–17.0)
MCH: 26.9 pg (ref 26.0–34.0)
MCHC: 28.1 g/dL — ABNORMAL LOW (ref 30.0–36.0)
MCV: 95.6 fL (ref 80.0–100.0)
Platelets: 284 10*3/uL (ref 150–400)
RBC: 3.2 MIL/uL — ABNORMAL LOW (ref 4.22–5.81)
RDW: 14.7 % (ref 11.5–15.5)
WBC: 4.2 10*3/uL (ref 4.0–10.5)
nRBC: 0 % (ref 0.0–0.2)

## 2021-10-25 LAB — BASIC METABOLIC PANEL
Anion gap: 7 (ref 5–15)
BUN: 61 mg/dL — ABNORMAL HIGH (ref 8–23)
CO2: 29 mmol/L (ref 22–32)
Calcium: 9.4 mg/dL (ref 8.9–10.3)
Chloride: 101 mmol/L (ref 98–111)
Creatinine, Ser: 2.56 mg/dL — ABNORMAL HIGH (ref 0.61–1.24)
GFR, Estimated: 26 mL/min — ABNORMAL LOW (ref >60)
Glucose, Bld: 106 mg/dL — ABNORMAL HIGH (ref 70–99)
Potassium: 5.1 mmol/L (ref 3.5–5.1)
Sodium: 137 mmol/L (ref 135–145)

## 2021-10-25 LAB — MAGNESIUM: Magnesium: 2.4 mg/dL (ref 1.7–2.4)

## 2021-10-25 MED ORDER — SODIUM CHLORIDE 0.9 % IV SOLN
INTRAVENOUS | Status: DC
Start: 2021-10-26 — End: 2021-10-26

## 2021-10-25 NOTE — Progress Notes (Signed)
?[facility]  ?ROUNDING NOTE  ? ?Subjective:  ? ?Matthew [name] is a 71 year old male with past medical conditions including obesity, A-fib on Eliquis, diastolic heart failure, COPD with as needed O2 use, and chronic kidney disease stage IV.  Patient presents to the emergency department with complaints of shortness of breath.  Patient was admitted for Rapid atrial fibrillation ([place]) [I48.91] ?Atrial fibrillation with rapid ventricular response ([place]) [I48.91] ?Acute respiratory failure with hypoxemia ([place]) [J96.01] ?Patient is known to our practice from previous admissions.  Patient was referred to our office for outpatient follow-up and has not yet scheduled an appointment.   ? ?Daily update: ?Patient resting comfortably ?Alert and oriented ?Tolerating small meals ?Denies shortness of breath, remains on room air ? ?Creatinine 2.56 ?Urine output 1.17 L in 24 hours ?Amiodarone drip in place ? ?Objective:  ?Vital signs in last 24 hours:  ?Temp:  [97.6 ?F (36.4 ?C)-98 ?F (36.7 ?C)] 97.7 ?F (36.5 ?C) (03/14 1123) ?Pulse Rate:  [46-121] 92 (03/14 1123) ?Resp:  [17-20] 18 (03/14 1123) ?BP: (94-152)/(56-128) 104/56 (03/14 1123) ?SpO2:  [94 %-100 %] 96 % (03/14 1123) ?Weight:  [125.7 kg] 125.7 kg (03/14 0357) ? ?Weight change: 0.407 kg ?Filed Weights  ? 10/24/21 0416 10/24/21 1212 10/25/21 0357  ?Weight: 126.6 kg 127 kg 125.7 kg  ? ? ?Intake/Output: ?I/O last 3 completed shifts: ?In: 540 [P.O.:240; I.V.:300] ?Out: 2025 [Urine:2025] ?  ?Intake/Output this shift: ? Total I/O ?In: 240 [P.O.:240] ?Out: 800 [Urine:800] ? ?Physical Exam: ?General: No acute distress  ?Head: Moist oral mucosal membranes  ?Eyes: Anicteric  ?Lungs:  Clear bilateral, normal effort  ?Heart: Irregular, no rubs  ?Abdomen:  Soft, nontender, obese  ?Extremities: No peripheral edema.  ?Neurologic: Awake, alert,oriented  ?Skin: No acute lesions or rashes  ?   ? ? ?Basic Metabolic Panel: ?Recent Labs  ?Lab 10/21/21 ?0607 10/22/21 ?0175  10/23/21 ?1025 10/24/21 ?8527 10/25/21 ?7824  ?NA 135 136 137 138 137  ?K 4.3 4.5 4.7 4.9 5.1  ?CL 102 100 102 100 101  ?CO2 '29 29 30 28 29  '$ ?GLUCOSE 99 122* 112* 95 106*  ?BUN 47* 53* 60* 58* 61*  ?CREATININE 3.14* 2.97* 3.20* 2.74* 2.56*  ?CALCIUM 8.9 9.0 9.1 9.6 9.4  ?MG  --   --   --   --  2.4  ? ? ? ?Liver Function Tests: ?No results for input(s): AST, ALT, ALKPHOS, BILITOT, PROT, ALBUMIN in the last 168 hours. ? ?No results for input(s): LIPASE, AMYLASE in the last 168 hours. ?No results for input(s): AMMONIA in the last 168 hours. ? ?CBC: ?Recent Labs  ?Lab 10/21/21 ?0607 10/22/21 ?2353 10/23/21 ?6144 10/24/21 ?3154 10/25/21 ?0086  ?WBC 3.6* 4.3 4.3 4.7 4.2  ?HGB 8.0* 8.0* 8.1* 8.3* 8.6*  ?HCT 28.1* 28.0* 28.3* 29.6* 30.6*  ?MCV 94.6 93.3 94.0 95.8 95.6  ?PLT 247 267 261 294 284  ? ? ? ?Cardiac Enzymes: ?No results for input(s): CKTOTAL, CKMB, CKMBINDEX, TROPONINI in the last 168 hours. ? ?BNP: ?Invalid input(s): POCBNP ? ?CBG: ?Recent Labs  ?Lab 10/19/21 ?1647  ?GLUCAP 120*  ? ? ? ?Microbiology: ?Results for orders placed or performed during the hospital encounter of 10/17/21  ?Resp Panel by RT-PCR (Flu A&B, Covid) Nasopharyngeal Swab     Status: None  ? Collection Time: 10/17/21  7:16 PM  ? Specimen: Nasopharyngeal Swab; Nasopharyngeal(NP) swabs in vial transport medium  ?Result Value Ref Range Status  ? SARS Coronavirus 2 by RT PCR NEGATIVE NEGATIVE Final  ?  Comment: (NOTE) ?SARS-CoV-2 target nucleic acids are NOT DETECTED. ? ?The SARS-CoV-2 RNA is generally detectable in upper respiratory ?specimens during the acute phase of infection. The lowest ?concentration of SARS-CoV-2 viral copies this assay can detect is ?138 copies/mL. A negative result does not preclude SARS-Cov-2 ?infection and should not be used as the sole basis for treatment or ?other patient management decisions. A negative result may occur with  ?improper specimen collection/handling, submission of specimen other ?than nasopharyngeal  swab, presence of viral mutation(s) within the ?areas targeted by this assay, and inadequate number of viral ?copies(<138 copies/mL). A negative result must be combined with ?clinical observations, patient history, and epidemiological ?information. The expected result is Negative. ? ?Fact Sheet for Patients:  ?EntrepreneurPulse.com.au ? ?Fact Sheet for Healthcare Providers:  ?IncredibleEmployment.be ? ?This test is no t yet approved or cleared by the [place] FDA and  ?has been authorized for detection and/or diagnosis of SARS-CoV-2 by ?FDA under an Emergency Use Authorization (EUA). This EUA will remain  ?in effect (meaning this test can be used) for the duration of the ?COVID-19 declaration under Section 564(b)(1) of the Act, 21 ?U.S.C.section 360bbb-3(b)(1), unless the authorization is terminated  ?or revoked sooner.  ? ? ?  ? Influenza A by PCR NEGATIVE NEGATIVE Final  ? Influenza B by PCR NEGATIVE NEGATIVE Final  ?  Comment: (NOTE) ?The Xpert Xpress SARS-CoV-2/FLU/RSV plus assay is intended as an aid ?in the diagnosis of influenza from Nasopharyngeal swab specimens and ?should not be used as a sole basis for treatment. Nasal washings and ?aspirates are unacceptable for Xpert Xpress SARS-CoV-2/FLU/RSV ?testing. ? ?Fact Sheet for Patients: ?EntrepreneurPulse.com.au ? ?Fact Sheet for Healthcare Providers: ?IncredibleEmployment.be ? ?This test is not yet approved or cleared by the [place] FDA and ?has been authorized for detection and/or diagnosis of SARS-CoV-2 by ?FDA under an Emergency Use Authorization (EUA). This EUA will remain ?in effect (meaning this test can be used) for the duration of the ?COVID-19 declaration under Section 564(b)(1) of the Act, 21 U.S.C. ?section 360bbb-3(b)(1), unless the authorization is terminated or ?revoked. ? ?Performed at [facility] [place], ?[place] [address] ?  ?Blood  culture (routine x 2)     Status: Abnormal  ? Collection Time: 10/17/21  7:16 PM  ? Specimen: BLOOD  ?Result Value Ref Range Status  ? Specimen Description   Final  ?  BLOOD LEFT WRIST ?Performed at [facility], [address], [place], [place] [address] ?  ? Special Requests   Final  ?  BOTTLES DRAWN AEROBIC AND ANAEROBIC Blood Culture adequate volume ?Performed at [facility], [address], [name],  [address] ?  ? Culture  Setup Time   Final  ?  GRAM POSITIVE COCCI ?IN BOTH AEROBIC AND ANAEROBIC BOTTLES ?CRITICAL RESULT CALLED TO, READ BACK BY AND VERIFIED WITHLonie Peak PHARMD 1422 10/18/21 HNM ?  ? Culture (A)  Final  ?  STAPHYLOCOCCUS EPIDERMIDIS ?THE SIGNIFICANCE OF ISOLATING THIS ORGANISM FROM A SINGLE SET OF BLOOD CULTURES WHEN MULTIPLE SETS ARE DRAWN IS UNCERTAIN. PLEASE NOTIFY THE MICROBIOLOGY DEPARTMENT WITHIN ONE WEEK IF SPECIATION AND SENSITIVITIES ARE REQUIRED. ?Performed at [facility], [place] [address] [place],  [address] ?  ? Report Status 10/21/2021 FINAL  Final  ?Blood culture (routine x 2)     Status: None  ? Collection Time: 10/17/21  7:16 PM  ? Specimen: BLOOD  ?Result Value Ref Range Status  ? Specimen Description BLOOD RIGHT  ARM  Final  ? Special Requests   Final  ?  BOTTLES DRAWN AEROBIC AND ANAEROBIC Blood Culture results may not be optimal due to an inadequate volume of blood received in culture bottles  ? Culture   Final  ?  NO GROWTH 5 DAYS ?Performed at [facility], [address], [place], [place] [address] ?  ? Report Status 10/22/2021 FINAL  Final  ?Blood Culture ID Panel (Reflexed)     Status: Abnormal  ? Collection Time: 10/17/21  7:16 PM  ?Result Value Ref Range Status  ? Enterococcus faecalis NOT DETECTED NOT DETECTED Final  ? Enterococcus Faecium NOT DETECTED NOT DETECTED Final  ? Listeria monocytogenes NOT DETECTED NOT DETECTED Final  ? Staphylococcus species DETECTED (A) NOT DETECTED Final  ?  Comment: CRITICAL RESULT  CALLED TO, READ BACK BY AND VERIFIED WITH: Lonie Peak PHARMD 1422 10/18/21 HNM ?  ? Staphylococcus aureus (BCID) NOT DETECTED NOT DETECTED Final  ? Staphylococcus epidermidis DETECTED (A) NOT DETECTED Final  ?  Comm

## 2021-10-25 NOTE — Progress Notes (Signed)
OT Cancellation Note ? ?Patient Details ?Name: Matthew [name] ?MRN: [id number] ?DOB: May 10, 1951 ? ? ?Cancelled Treatment:    Reason Eval/Treat Not Completed: Patient declined, no reason specified;Fatigue/lethargy limiting ability to participate. Pt sleeping, wakes easily, endorses fatigue and politely but adamantly declines OT this afternoon. Agreeable to re-attempt next date, reporting preference for morning session if possible.  ? ?Ardeth Perfect., MPH, MS, OTR/L ?ascom [phone] ?10/25/21, 2:56 PM ?

## 2021-10-25 NOTE — Progress Notes (Signed)
PT Cancellation Note ? ?Patient Details ?Name: Matthew [name] ?MRN: [id number] ?DOB: 1950/09/12 ? ? ?Cancelled Treatment:    Reason Eval/Treat Not Completed: Fatigue/lethargy limiting ability to participate.  Chart reviewed.  Pt sleeping in bed upon PT arrival but woken with vc's.  Pt reports not sleeping well last night and when he does not sleep well, he does not do well in general; pt firmly declining physical therapy d/t wanting to rest but requesting therapy tomorrow (morning session if possible).  Will re-attempt PT session at a later date/time as able. ? ?Leitha Bleak, PT ?10/25/21, 3:24 PM ? ?

## 2021-10-25 NOTE — Progress Notes (Signed)
? ?Progress Note ? ?Patient Name: Matthew [name] ?Date of Encounter: 10/25/2021 ? ?[facility] Cardiologist: Nelva Bush, MD  ? ?Subjective  ? ?The patient underwent TEE yesterday that showed no intracardiac thrombus.  Cardioversion was performed but was not successful.  Given this, he was initiated on amiodarone drip.  This morning, he remains in A-fib with ventricular rates in the 90s to low 100s bpm.  He denies angina, dyspnea, palpitations, dizziness, presyncope, or syncope.  He has not missed any doses of [place].  Hgb remains low, though stable.  Renal function stable.  [facility] was notified yesterday around 1700 of reported ventricular rates of 17 bpm.  Upon reviewing telemetry this morning, there is no documented evidence of this. ? ?Inpatient Medications  ?  ?Scheduled Meds: ? apixaban  5 mg Oral BID  ? docusate sodium  200 mg Oral BID  ? DULoxetine  60 mg Oral BID  ? feeding supplement (GLUCERNA SHAKE)  237 mL Oral BID BM  ? furosemide  20 mg Oral Daily  ? melatonin  5 mg Oral QHS  ? metoprolol succinate  25 mg Oral BID  ? mirtazapine  15 mg Oral QHS  ? mometasone-formoterol  2 puff Inhalation BID  ? multivitamin with minerals  1 tablet Oral Daily  ? Ensure Max Protein  11 oz Oral QHS  ? tamsulosin  0.4 mg Oral QPC supper  ? ?Continuous Infusions: ? sodium chloride    ? sodium chloride    ? amiodarone 30 mg/hr (10/25/21 0120)  ? ?PRN Meds: ?acetaminophen, levalbuterol, ondansetron (ZOFRAN) IV, traMADol  ? ?Vital Signs  ?  ?Vitals:  ? 10/25/21 0032 10/25/21 0357 10/25/21 0800 10/25/21 0908  ?BP: 101/80 108/76 120/89 115/76  ?Pulse: (!) 104 83 88 86  ?Resp: '20 20 18 18  '$ ?Temp: 97.8 ?F (36.6 ?C) 97.8 ?F (36.6 ?C) 97.6 ?F (36.4 ?C) 98 ?F (36.7 ?C)  ?TempSrc:    Oral  ?SpO2: 100% 97% 100% 94%  ?Weight:  125.7 kg    ?Height:      ? ? ?Intake/Output Summary (Last 24 hours) at 10/25/2021 0943 ?Last data filed at 10/25/2021 0425 ?Gross per 24 hour  ?Intake 300 ml  ?Output 1175 ml  ?Net -875 ml  ? ? ?Last 3  Weights 10/25/2021 10/24/2021 10/24/2021  ?Weight (lbs) 277 lb 1.6 oz 280 lb 279 lb 1.6 oz  ?Weight (kg) 125.692 kg 127.007 kg 126.6 kg  ?   ? ?Telemetry  ?  ?Afib with ventricular rates in the 90s-100s bpm  - Personally Reviewed ? ?ECG  ?  ?No new tracings - Personally Reviewed ? ?Physical Exam  ? ?VS:  BP 115/76 (BP Location: Left Arm)   Pulse 86   Temp 98 ?F (36.7 ?C) (Oral)   Resp 18   Ht '6\' 2"'$  (1.88 m)   Wt 125.7 kg   SpO2 94%   BMI 35.58 kg/m?  , BMI Body mass index is 35.58 kg/m?. ?GENERAL:  Well appearing ?HEENT: Pupils equal round and reactive, fundi not visualized, oral mucosa unremarkable ?NECK: Unable to assess JVP secondary to body habitus.  No bruits, no thyromegaly ?LUNGS:  Clear to auscultation bilaterally ?HEART: Tachycardic.  Irregularly irregular.  PMI not displaced or sustained,S1 and S2 within normal limits, no S3, no S4, no clicks, no rubs, no murmurs ?ABD:  Flat, positive bowel sounds normal in frequency in pitch, no bruits, no rebound, no guarding, no midline pulsatile mass, no hepatomegaly, no splenomegaly ?EXT:  2 plus pulses  throughout, 1+ LE edema, no cyanosis no clubbing ?SKIN:  No rashes no nodules ?NEURO:  Cranial nerves II through XII grossly intact, motor grossly intact throughout ?PSYCH:  Cognitively intact, oriented to person place and time ? ? ?Labs  ?  ?High Sensitivity Troponin:   ?Recent Labs  ?Lab 10/17/21 ?1915 10/17/21 ?2116  ?TROPONINIHS 15 13  ? ?   ?Chemistry ?Recent Labs  ?Lab 10/23/21 ?1287 10/24/21 ?8676 10/25/21 ?7209  ?NA 137 138 137  ?K 4.7 4.9 5.1  ?CL 102 100 101  ?CO2 '30 28 29  '$ ?GLUCOSE 112* 95 106*  ?BUN 60* 58* 61*  ?CREATININE 3.20* 2.74* 2.56*  ?CALCIUM 9.1 9.6 9.4  ?MG  --   --  2.4  ?GFRNONAA 20* 24* 26*  ?ANIONGAP '5 10 7  '$ ? ?  ?Lipids No results for input(s): CHOL, TRIG, HDL, LABVLDL, LDLCALC, CHOLHDL in the last 168 hours.  ?Hematology ?Recent Labs  ?Lab 10/23/21 ?4709 10/24/21 ?6283 10/25/21 ?6629  ?WBC 4.3 4.7 4.2  ?RBC 3.01* 3.09* 3.20*  ?HGB 8.1*  8.3* 8.6*  ?HCT 28.3* 29.6* 30.6*  ?MCV 94.0 95.8 95.6  ?MCH 26.9 26.9 26.9  ?MCHC 28.6* 28.0* 28.1*  ?RDW 14.6 14.8 14.7  ?PLT 261 294 284  ? ? ?Thyroid No results for input(s): TSH, FREET4 in the last 168 hours.  ?BNP ?No results for input(s): BNP, PROBNP in the last 168 hours. ?  ?DDimer No results for input(s): DDIMER in the last 168 hours.  ? ?Radiology  ?  ?DG Abd Portable 1V ? ?Result Date: 10/23/2021 ?IMPRESSION: 1. Unremarkable bowel gas pattern. 2. Mild atelectasis or scarring at the left lung base. Electronically Signed   By: Van Clines M.D.   On: 10/23/2021 15:26  ? ?Cardiac Studies  ? ?TEE 10/24/2021: ? 1. Left ventricular ejection fraction, by estimation, is 30 to 35%. The  ?left ventricle has moderate to severely decreased function. The left  ?ventricle demonstrates global hypokinesis.  ? 2. Right ventricular systolic function is low normal. The right  ?ventricular size is mildly enlarged.  ? 3. Left atrial size was moderately dilated. No left atrial/left atrial  ?appendage thrombus was detected.  ? 4. Right atrial size was mildly dilated.  ? 5. The mitral valve is normal in structure. Mild mitral valve  ?regurgitation.  ? 6. Tricuspid valve regurgitation is mild to moderate.  ? 7. The aortic valve is tricuspid. Aortic valve regurgitation is not  ?visualized.  ? ?Echo 10/18/21: ? 1. Left ventricular ejection fraction, by estimation, is 35 to 40%. The  ?left ventricle has moderately decreased function. Left ventricular  ?endocardial border not optimally defined to evaluate regional wall motion.  ?The left ventricular internal cavity  ?size was mildly to moderately dilated. There is moderate left ventricular  ?hypertrophy. Left ventricular diastolic function could not be evaluated.  ? 2. Right ventricular systolic function is normal. The right ventricular  ?size is mildly enlarged. Mildly increased right ventricular wall  ?thickness.  ? 3. Left atrial size was mildly dilated.  ? 4. Right atrial size  was mildly dilated.  ? 5. The mitral valve was not well visualized. Mild mitral valve  ?regurgitation.  ? 6. The aortic valve is tricuspid. Aortic valve regurgitation is not  ?visualized. No aortic stenosis is present.  ? 7. The inferior vena cava is dilated in size with >50% respiratory  ?variability, suggesting right atrial pressure of 8 mmHg.  ? ?[place] 10/17/2018: ?No CAD. ? ?Patient Profile  ?   ?71  y.o. male with chronic systolic and diastolic heart failure, persistent atrial fibrillation, asthma/COPD, CKD stage 3/4, anemia of chronic disease, and obesity admitted with COPD exacerbation requiring BiPAP and A-fib with RVR.   ? ?Assessment & Plan  ?  ?# Persistent atrial fibrillation:  ?He underwent TEE and cardioversion on 10/24/2021.  Cardioversion was not successful in converting him to sinus rhythm at that time.  Ventricular rate is ranging from 100 to 120 bpm.  Not able to increase the dose of Toprol given relatively low blood pressure, current dose will be continued.  He was on oral amiodarone that was started on Friday, which was transitioned to IV amiodarone on 10/25/2021.  This morning, he remains in A-fib with ventricular rates in the 90s to low 100s bpm.  He has not missed any doses of [place].  Schedule repeat DCCV for 10/26/2021 following reloading of amiodarone via IV. ? ?#Acute on chronic systolic and diastolic heart failure: ?LVEF this admission 35 to 40%.  Thought to be nonischemic.  He had a left heart cath in 2020 that revealed no coronary disease.  Continue metoprolol succinate.  Not able to add GDMT at the present time given low blood pressure, escalate GDMT as able.  He appears to be euvolemic on small dose furosemide. ? ?#COPD exacerbation: ?Improved.  Managed by IM. ? ?#CKD 4: ?Creatinine stable, ranging from 2.4-3.2.  Continue Lasix 20 mg daily. ? ?#Normocytic anemia: ?Prior GI bleed.  He has declined invasive work-up.  H/H is stable on Eliquis.  Consider [place] as an outpatient.  ? ? ? ?Shared  Decision Making/Informed Consent{ ? ?The risks (stroke, cardiac arrhythmias rarely resulting in the need for a temporary or permanent pacemaker, skin irritation or burns and complications associated with consc

## 2021-10-25 NOTE — Progress Notes (Signed)
?PROGRESS NOTE ? ? ?HPI was taken from Dr. Damita Dunnings: ?Matthew [name] is a 71 y.o. male with medical history significant for COPD, OSA not on CPAP, prior home O2 use, class IV obesity, CKD stage IV, HTN, A-fib on Eliquis, diastolic CHF with last exacerbation in October 2022, who presents to the ED by EMS with respiratory distress, arriving on CPAP.  Patient states he was in his usual state of health until the day prior when he started having shortness of breath which significantly worsened over the next 24 hours.  He has associated nonradiating retrosternal tightness, worse with breathing.  Denies lower extremity edema.  Has some orthopnea.  Has a nonproductive cough but no fever or chills.  No affected contacts ?ED course: On arrival tachycardic to 138, tachypneic to 29 with BP 99/74 and O2 sat of 100% on CPAP with increased work of breathing.  Patient transitioned to BiPAP on arrival ?Blood work: Troponin 15-13, BNP 691.  Creatinine 2.45 which is at baseline.  WBC 8000 with lactic acid 1.4 and Pro-Calc 5.36.  Hemoglobin 10.1. ?EKG, personally viewed and interpreted: A-fib at 165 with no acute ST-T wave changes ?Chest x-ray no active disease ?Patient given an amiodarone bolus and started on an infusion.  Diltiazem not given due to soft blood pressure.  Started on doxycycline for possible bronchitis.  Hospitalist consulted for admission.  ? ? ?As per Dr. Jimmye Norman 3/8-3/14/23: Pt went into a. fib w/ RVR likely secondary to medication non-compliance. Pt was on metoprolol, eliquis but remains in a. fib. Pt is s/p cardioversion x 2 on 10/24/21 but it was unsuccessful so pt remains in a.fib. Pt was started on IV amiodarone and will likely have another cardioversion tomorrow as per cardio. Of note, PT/OT recs SNF but pts wife instead wants to take pt home w/ home health. Medical equipment was suppose to be delivered to pt's house prior to d/c (check with CM on this).  ? Matthew [name]  KGM:[id number] DOB: May 01, 1951 DOA:  10/17/2021 ?PCP: [name], MD ? ?Assessment & Plan: ?  ?Principal Problem: ?  Rapid atrial fibrillation ([place]) ?Active Problems: ?  Acute on chronic respiratory failure with hypoxia (HCC) ?  Acute on chronic diastolic CHF (congestive heart failure) ([place]) ?  Hypertension ?  COPD (chronic obstructive pulmonary disease) ([place]) ?  CKD (chronic kidney disease) stage 4, GFR 15-29 ml/min (HCC) ?  Obesity, Class III, BMI 40-49.9 (morbid obesity) ([place]) ?  OSA (obstructive sleep apnea) ?  NICM (nonischemic cardiomyopathy) ([place]) ? ? ?PAF: w/ RVR & has progressed to persistent as per cardio. Likely secondary to medication non-compliance. Continue on IV amiodarone, metoprolol & eliquis. S/p cardioversion x 2 which was unsuccessful as EKG still shows persistent a. fib on 10/24/21. Repeat cardioversion tomorrow as per cardio  ?  ?Unlikely bacteremia: 1/4 with s epi, likely contaminant.  Repeat blood cxs NGTD ?  ?Acute on chronic systolic CHF: continue on lasix. Monitor I/Os  ? ?Acute hypoxic respiratory failure: 88% on RA documented in ED. Weaned off of BiPAP.Weaned off of supplemental oxygen  ?  ?HTN: continue on metoprolol ?  ?COPD: w/o exacerbation. Continue on bronchodilators & encourage incentive spirometry  ?  ?AKI on CKDIV: Cr is trending down today. Avoid nephrotoxic meds  ?  ?OSA: not on CPAP at home  ?  ?BPH: continue on home dose of flomax ?  ?Depression: severity unknown. Continue on home dose of duloxetine, mirtazapine  ? ?Gout: not recently taking allopurinol  ?  ?ACD:  likely secondary to CKD. H&H are labile  ? ?Obesity: BMI 35.9. Complicates overall care & prognosis ?  ? ? ? ?DVT prophylaxis: eliquis  ?Code Status: full ?Family Communication:  ?Disposition Plan: d/c home w/ HH  ? ?Level of care: Progressive ? ?Status is: Inpatient ?Remains inpatient appropriate because: s/p cardioversion today but was unsuccessful.  Will go for another cardioversion tomorrow as per cardio  ? ?Consultants:  ?Cardio   ? ?Procedures: ? ?Antimicrobials:  ? ?Subjective: ?Pt c/o malaise  ? ?Objective: ?Vitals:  ? 10/24/21 1900 10/24/21 1909 10/25/21 0032 10/25/21 0357  ?BP:  118/70 101/80 108/76  ?Pulse:  (!) 107 (!) 104 83  ?Resp:  '17 20 20  '$ ?Temp: 98 ?F (36.7 ?C) 98 ?F (36.7 ?C) 97.8 ?F (36.6 ?C) 97.8 ?F (36.6 ?C)  ?TempSrc:      ?SpO2:  95% 100% 97%  ?Weight:    125.7 kg  ?Height:      ? ? ?Intake/Output Summary (Last 24 hours) at 10/25/2021 0807 ?Last data filed at 10/25/2021 0425 ?Gross per 24 hour  ?Intake 300 ml  ?Output 1175 ml  ?Net -875 ml  ? ?Filed Weights  ? 10/24/21 0416 10/24/21 1212 10/25/21 0357  ?Weight: 126.6 kg 127 kg 125.7 kg  ? ? ?Examination: ? ?General exam: Appears calm & comfortable. Obese  ?Respiratory system: diminished breath sounds b/l ?Cardiovascular system: irregularly irregular. No rubs or clicks  ?Gastrointestinal system: abd is soft, NT, obese & normal bowel sounds  ?Central nervous system: alert and oriented. Moves all extremities  ?Psychiatry: Judgement and insight appears normal. Flat mood and affect ? ? ? ?Data Reviewed: I have personally reviewed following labs and imaging studies ? ?CBC: ?Recent Labs  ?Lab 10/21/21 ?0607 10/22/21 ?0973 10/23/21 ?5329 10/24/21 ?9242 10/25/21 ?6834  ?WBC 3.6* 4.3 4.3 4.7 4.2  ?HGB 8.0* 8.0* 8.1* 8.3* 8.6*  ?HCT 28.1* 28.0* 28.3* 29.6* 30.6*  ?MCV 94.6 93.3 94.0 95.8 95.6  ?PLT 247 267 261 294 284  ? ?Basic Metabolic Panel: ?Recent Labs  ?Lab 10/21/21 ?0607 10/22/21 ?1962 10/23/21 ?2297 10/24/21 ?9892 10/25/21 ?1194  ?NA 135 136 137 138 137  ?K 4.3 4.5 4.7 4.9 5.1  ?CL 102 100 102 100 101  ?CO2 '29 29 30 28 29  '$ ?GLUCOSE 99 122* 112* 95 106*  ?BUN 47* 53* 60* 58* 61*  ?CREATININE 3.14* 2.97* 3.20* 2.74* 2.56*  ?CALCIUM 8.9 9.0 9.1 9.6 9.4  ?MG  --   --   --   --  2.4  ? ?GFR: ?Estimated Creatinine Clearance: 37.3 mL/min (A) (by C-G formula based on SCr of 2.56 mg/dL (H)). ?Liver Function Tests: ?No results for input(s): AST, ALT, ALKPHOS, BILITOT, PROT, ALBUMIN in  the last 168 hours. ? ?No results for input(s): LIPASE, AMYLASE in the last 168 hours. ?No results for input(s): AMMONIA in the last 168 hours. ?Coagulation Profile: ?No results for input(s): INR, PROTIME in the last 168 hours. ?Cardiac Enzymes: ?No results for input(s): CKTOTAL, CKMB, CKMBINDEX, TROPONINI in the last 168 hours. ?BNP (last 3 results) ?No results for input(s): PROBNP in the last 8760 hours. ?HbA1C: ?No results for input(s): HGBA1C in the last 72 hours. ?CBG: ?Recent Labs  ?Lab 10/19/21 ?1647  ?GLUCAP 120*  ? ?Lipid Profile: ?No results for input(s): CHOL, HDL, LDLCALC, TRIG, CHOLHDL, LDLDIRECT in the last 72 hours. ?Thyroid Function Tests: ?No results for input(s): TSH, T4TOTAL, FREET4, T3FREE, THYROIDAB in the last 72 hours. ?Anemia Panel: ?No results for input(s): VITAMINB12, FOLATE, FERRITIN, TIBC, IRON,  RETICCTPCT in the last 72 hours. ?Sepsis Labs: ?No results for input(s): PROCALCITON, LATICACIDVEN in the last 168 hours. ? ? ?Recent Results (from the past 240 hour(s))  ?Resp Panel by RT-PCR (Flu A&B, Covid) Nasopharyngeal Swab     Status: None  ? Collection Time: 10/17/21  7:16 PM  ? Specimen: Nasopharyngeal Swab; Nasopharyngeal(NP) swabs in vial transport medium  ?Result Value Ref Range Status  ? SARS Coronavirus 2 by RT PCR NEGATIVE NEGATIVE Final  ?  Comment: (NOTE) ?SARS-CoV-2 target nucleic acids are NOT DETECTED. ? ?The SARS-CoV-2 RNA is generally detectable in upper respiratory ?specimens during the acute phase of infection. The lowest ?concentration of SARS-CoV-2 viral copies this assay can detect is ?138 copies/mL. A negative result does not preclude SARS-Cov-2 ?infection and should not be used as the sole basis for treatment or ?other patient management decisions. A negative result may occur with  ?improper specimen collection/handling, submission of specimen other ?than nasopharyngeal swab, presence of viral mutation(s) within the ?areas targeted by this assay, and inadequate number  of viral ?copies(<138 copies/mL). A negative result must be combined with ?clinical observations, patient history, and epidemiological ?information. The expected result is Negative. ? ?Fact Sheet for Patients:  ?https://www.f

## 2021-10-26 ENCOUNTER — Inpatient Hospital Stay: Payer: Medicare HMO | Admitting: Anesthesiology

## 2021-10-26 ENCOUNTER — Encounter
Admission: EM | Disposition: A | Discharge status: Home Health | Payer: Self-pay | Source: Home / Self Care | Attending: Internal Medicine

## 2021-10-26 DIAGNOSIS — I429 Cardiomyopathy, unspecified: Secondary | ICD-10-CM | POA: Diagnosis not present

## 2021-10-26 DIAGNOSIS — N189 Chronic kidney disease, unspecified: Secondary | ICD-10-CM | POA: Diagnosis not present

## 2021-10-26 DIAGNOSIS — I4891 Unspecified atrial fibrillation: Secondary | ICD-10-CM | POA: Diagnosis not present

## 2021-10-26 HISTORY — PX: CARDIOVERSION: SHX1299

## 2021-10-26 LAB — BASIC METABOLIC PANEL
Anion gap: 6 (ref 5–15)
BUN: 67 mg/dL — ABNORMAL HIGH (ref 8–23)
CO2: 30 mmol/L (ref 22–32)
Calcium: 9.8 mg/dL (ref 8.9–10.3)
Chloride: 101 mmol/L (ref 98–111)
Creatinine, Ser: 2.62 mg/dL — ABNORMAL HIGH (ref 0.61–1.24)
GFR, Estimated: 25 mL/min — ABNORMAL LOW (ref >60)
Glucose, Bld: 97 mg/dL (ref 70–99)
Potassium: 5.1 mmol/L (ref 3.5–5.1)
Sodium: 137 mmol/L (ref 135–145)

## 2021-10-26 LAB — CBC
HCT: 31.5 % — ABNORMAL LOW (ref 39.0–52.0)
Hemoglobin: 8.9 g/dL — ABNORMAL LOW (ref 13.0–17.0)
MCH: 27 pg (ref 26.0–34.0)
MCHC: 28.3 g/dL — ABNORMAL LOW (ref 30.0–36.0)
MCV: 95.5 fL (ref 80.0–100.0)
Platelets: 287 10*3/uL (ref 150–400)
RBC: 3.3 MIL/uL — ABNORMAL LOW (ref 4.22–5.81)
RDW: 14.7 % (ref 11.5–15.5)
WBC: 5.1 10*3/uL (ref 4.0–10.5)
nRBC: 0 % (ref 0.0–0.2)

## 2021-10-26 LAB — MAGNESIUM: Magnesium: 2.3 mg/dL (ref 1.7–2.4)

## 2021-10-26 SURGERY — CARDIOVERSION
Anesthesia: General

## 2021-10-26 MED ORDER — PROPOFOL 10 MG/ML IV BOLUS
INTRAVENOUS | Status: DC | PRN
  Administered 2021-10-26: 30 mg via INTRAVENOUS
  Administered 2021-10-26: 50 mg via INTRAVENOUS

## 2021-10-26 MED ORDER — ALPRAZOLAM 0.5 MG PO TABS
0.5000 mg | ORAL_TABLET | Freq: Once | ORAL | Status: AC
  Administered 2021-10-26: 0.5 mg via ORAL
  Filled 2021-10-26: qty 1

## 2021-10-26 MED ORDER — PROPOFOL 10 MG/ML IV BOLUS
INTRAVENOUS | Status: AC
  Filled 2021-10-26: qty 20

## 2021-10-26 NOTE — Progress Notes (Signed)
?[facility]  ?ROUNDING NOTE  ? ?Subjective:  ? ?Matthew [name] is a 71 year old male with past medical conditions including obesity, A-fib on Eliquis, diastolic heart failure, COPD with as needed O2 use, and chronic kidney disease stage IV.  Patient presents to the emergency department with complaints of shortness of breath.  Patient was admitted for Rapid atrial fibrillation ([place]) [I48.91] ?Atrial fibrillation with rapid ventricular response ([place]) [I48.91] ?Acute respiratory failure with hypoxemia ([place]) [J96.01] ?Patient is known to our practice from previous admissions.  Patient was referred to our office for outpatient follow-up and has not yet scheduled an appointment.   ? ?Daily update: ?Patient seen lying in bed, alert and oriented ?Currently n.p.o. for scheduled procedure with cardiology ? ?Creatinine 2.62 ?Urine output 1.75 L in preceding 24 hours ?Amiodarone drip  ? ?Objective:  ?Vital signs in last 24 hours:  ?Temp:  [98 ?F (36.7 ?C)-98.7 ?F (37.1 ?C)] 98 ?F (36.7 ?C) (03/15 1123) ?Pulse Rate:  [50-124] 65 (03/15 1227) ?Resp:  [17-29] 17 (03/15 1227) ?BP: (94-121)/(53-85) 109/68 (03/15 1227) ?SpO2:  [92 %-100 %] 99 % (03/15 1227) ?Weight:  [127 kg-129.4 kg] 127 kg (03/15 0813) ? ?Weight change: 2.393 kg ?Filed Weights  ? 10/25/21 2358 10/26/21 0410 10/26/21 0813  ?Weight: 129.4 kg 127.5 kg 127 kg  ? ? ?Intake/Output: ?I/O last 3 completed shifts: ?In: 1100.4 [P.O.:240; I.V.:860.4] ?Out: 2125 [Urine:2125] ?  ?Intake/Output this shift: ? Total I/O ?In: 440 [P.O.:240; I.V.:200] ?Out: 450 [Urine:450] ? ?Physical Exam: ?General: No acute distress  ?Head: Moist oral mucosal membranes  ?Eyes: Anicteric  ?Lungs:  Clear bilateral, normal effort  ?Heart: Irregular, no rubs  ?Abdomen:  Soft, nontender, obese  ?Extremities: No peripheral edema.  ?Neurologic: Awake, alert,oriented  ?Skin: No acute lesions or rashes  ?   ? ? ?Basic Metabolic Panel: ?Recent Labs  ?Lab 10/22/21 ?5035 10/23/21 ?4656  10/24/21 ?8127 10/25/21 ?5170 10/26/21 ?0541  ?NA 136 137 138 137 137  ?K 4.5 4.7 4.9 5.1 5.1  ?CL 100 102 100 101 101  ?CO2 '29 30 28 29 30  '$ ?GLUCOSE 122* 112* 95 106* 97  ?BUN 53* 60* 58* 61* 67*  ?CREATININE 2.97* 3.20* 2.74* 2.56* 2.62*  ?CALCIUM 9.0 9.1 9.6 9.4 9.8  ?MG  --   --   --  2.4 2.3  ? ? ? ?Liver Function Tests: ?No results for input(s): AST, ALT, ALKPHOS, BILITOT, PROT, ALBUMIN in the last 168 hours. ? ?No results for input(s): LIPASE, AMYLASE in the last 168 hours. ?No results for input(s): AMMONIA in the last 168 hours. ? ?CBC: ?Recent Labs  ?Lab 10/22/21 ?0174 10/23/21 ?9449 10/24/21 ?6759 10/25/21 ?1638 10/26/21 ?0541  ?WBC 4.3 4.3 4.7 4.2 5.1  ?HGB 8.0* 8.1* 8.3* 8.6* 8.9*  ?HCT 28.0* 28.3* 29.6* 30.6* 31.5*  ?MCV 93.3 94.0 95.8 95.6 95.5  ?PLT 267 261 294 284 287  ? ? ? ?Cardiac Enzymes: ?No results for input(s): CKTOTAL, CKMB, CKMBINDEX, TROPONINI in the last 168 hours. ? ?BNP: ?Invalid input(s): POCBNP ? ?CBG: ?Recent Labs  ?Lab 10/19/21 ?1647  ?GLUCAP 120*  ? ? ? ?Microbiology: ?Results for orders placed or performed during the hospital encounter of 10/17/21  ?Resp Panel by RT-PCR (Flu A&B, Covid) Nasopharyngeal Swab     Status: None  ? Collection Time: 10/17/21  7:16 PM  ? Specimen: Nasopharyngeal Swab; Nasopharyngeal(NP) swabs in vial transport medium  ?Result Value Ref Range Status  ? SARS Coronavirus 2 by RT PCR NEGATIVE NEGATIVE Final  ?  Comment: (NOTE) ?SARS-CoV-2 target nucleic acids are NOT DETECTED. ? ?The SARS-CoV-2 RNA is generally detectable in upper respiratory ?specimens during the acute phase of infection. The lowest ?concentration of SARS-CoV-2 viral copies this assay can detect is ?138 copies/mL. A negative result does not preclude SARS-Cov-2 ?infection and should not be used as the sole basis for treatment or ?other patient management decisions. A negative result may occur with  ?improper specimen collection/handling, submission of specimen other ?than nasopharyngeal swab,  presence of viral mutation(s) within the ?areas targeted by this assay, and inadequate number of viral ?copies(<138 copies/mL). A negative result must be combined with ?clinical observations, patient history, and epidemiological ?information. The expected result is Negative. ? ?Fact Sheet for Patients:  ?EntrepreneurPulse.com.au ? ?Fact Sheet for Healthcare Providers:  ?IncredibleEmployment.be ? ?This test is no t yet approved or cleared by the [place] FDA and  ?has been authorized for detection and/or diagnosis of SARS-CoV-2 by ?FDA under an Emergency Use Authorization (EUA). This EUA will remain  ?in effect (meaning this test can be used) for the duration of the ?COVID-19 declaration under Section 564(b)(1) of the Act, 21 ?U.S.C.section 360bbb-3(b)(1), unless the authorization is terminated  ?or revoked sooner.  ? ? ?  ? Influenza A by PCR NEGATIVE NEGATIVE Final  ? Influenza B by PCR NEGATIVE NEGATIVE Final  ?  Comment: (NOTE) ?The Xpert Xpress SARS-CoV-2/FLU/RSV plus assay is intended as an aid ?in the diagnosis of influenza from Nasopharyngeal swab specimens and ?should not be used as a sole basis for treatment. Nasal washings and ?aspirates are unacceptable for Xpert Xpress SARS-CoV-2/FLU/RSV ?testing. ? ?Fact Sheet for Patients: ?EntrepreneurPulse.com.au ? ?Fact Sheet for Healthcare Providers: ?IncredibleEmployment.be ? ?This test is not yet approved or cleared by the [place] FDA and ?has been authorized for detection and/or diagnosis of SARS-CoV-2 by ?FDA under an Emergency Use Authorization (EUA). This EUA will remain ?in effect (meaning this test can be used) for the duration of the ?COVID-19 declaration under Section 564(b)(1) of the Act, 21 U.S.C. ?section 360bbb-3(b)(1), unless the authorization is terminated or ?revoked. ? ?Performed at [facility], [place], ?[place] [address] ?  ?Blood culture  (routine x 2)     Status: Abnormal  ? Collection Time: 10/17/21  7:16 PM  ? Specimen: BLOOD  ?Result Value Ref Range Status  ? Specimen Description   Final  ?  BLOOD LEFT WRIST ?Performed at [facility], [address], [place], [place] [address] ?  ? Special Requests   Final  ?  BOTTLES DRAWN AEROBIC AND ANAEROBIC Blood Culture adequate volume ?Performed at [facility], [address] [place], [place] [address] ?  ? Culture  Setup Time   Final  ?  GRAM POSITIVE COCCI ?IN BOTH AEROBIC AND ANAEROBIC BOTTLES ?CRITICAL RESULT CALLED TO, READ BACK BY AND VERIFIED WITHLonie Peak PHARMD 1422 10/18/21 HNM ?  ? Culture (A)  Final  ?  STAPHYLOCOCCUS EPIDERMIDIS ?THE SIGNIFICANCE OF ISOLATING THIS ORGANISM FROM A SINGLE SET OF BLOOD CULTURES WHEN MULTIPLE SETS ARE DRAWN IS UNCERTAIN. PLEASE NOTIFY THE MICROBIOLOGY DEPARTMENT WITHIN ONE WEEK IF SPECIATION AND SENSITIVITIES ARE REQUIRED. ?Performed at [facility], [place] [address] [place], [place] [address] ?  ? Report Status 10/21/2021 FINAL  Final  ?Blood culture (routine x 2)     Status: None  ? Collection Time: 10/17/21  7:16 PM  ? Specimen: BLOOD  ?Result Value Ref Range Status  ? Specimen Description BLOOD RIGHT  ARM  Final  ? Special Requests   Final  ?  BOTTLES DRAWN AEROBIC AND ANAEROBIC Blood Culture results may not be optimal due to an inadequate volume of blood received in culture bottles  ? Culture   Final  ?  NO GROWTH 5 DAYS ?Performed at [facility], [address] [place], [place] [address] ?  ? Report Status 10/22/2021 FINAL  Final  ?Blood Culture ID Panel (Reflexed)     Status: Abnormal  ? Collection Time: 10/17/21  7:16 PM  ?Result Value Ref Range Status  ? Enterococcus faecalis NOT DETECTED NOT DETECTED Final  ? Enterococcus Faecium NOT DETECTED NOT DETECTED Final  ? Listeria monocytogenes NOT DETECTED NOT DETECTED Final  ? Staphylococcus species DETECTED (A) NOT DETECTED Final  ?  Comment: CRITICAL RESULT CALLED  TO, READ BACK BY AND VERIFIED WITH: Lonie Peak PHARMD 1422 10/18/21 HNM ?  ? Staphylococcus aureus (BCID) NOT DETECTED NOT DETECTED Final  ? Staphylococcus epidermidis DETECTED (A) NOT DETECTED Final  ?  Comm

## 2021-10-26 NOTE — Anesthesia Preprocedure Evaluation (Addendum)
Anesthesia Evaluation  ?Patient identified by MRN, date of birth, ID band ?Patient awake ? ? ? ?Reviewed: ?Allergy & Precautions, NPO status , Patient's Chart, lab work & pertinent test results ? ?History of Anesthesia Complications ?Negative for: history of anesthetic complications ? ?Airway ?Mallampati: III ? ?TM Distance: >3 FB ?Neck ROM: Full ? ? ? Dental ? ?(+) Poor Dentition, Missing, Chipped ?  ?Pulmonary ?asthma , sleep apnea , COPD, Patient abstained from smoking.Not current smoker,  ?  ?Pulmonary exam normal ?breath sounds clear to auscultation ? ? ? ? ? ? Cardiovascular ?Exercise Tolerance: Poor ?METShypertension, + CAD and +CHF  ?(-) Past MI + dysrhythmias Atrial Fibrillation + Valvular Problems/Murmurs  ?Rhythm:Irregular Rate:Tachycardia ?- Systolic murmurs ??1. Left ventricular ejection fraction, by estimation, is 30 to 35%. The  ?left ventricle has moderate to severely decreased function. The left  ?ventricle demonstrates global hypokinesis.  ??2. Right ventricular systolic function is low normal. The right  ?ventricular size is mildly enlarged.  ??3. Left atrial size was moderately dilated. No left atrial/left atrial  ?appendage thrombus was detected.  ??4. Right atrial size was mildly dilated.  ??5. The mitral valve is normal in structure. Mild mitral valve  ?regurgitation.  ??6. Tricuspid valve regurgitation is mild to moderate.  ??7. The aortic valve is tricuspid. Aortic valve regurgitation is not  ?visualized.  ?  ?Neuro/Psych ?PSYCHIATRIC DISORDERS Anxiety Depression negative neurological ROS ?   ? GI/Hepatic ?neg GERD  ,(+)  ?  ? (-) substance abuse ? ,   ?Endo/Other  ?neg diabetes ? Renal/GU ?CRFRenal disease  ? ?  ?Musculoskeletal ? ? Abdominal ?  ?Peds ? Hematology ?  ?Anesthesia Other Findings ?Past Medical History: ?No date: Anemia ?No date: Anxiety ?No date: Arrhythmia ?    Comment:  atrial fibrillation ?No date: Asthma ?No date: CAD (coronary artery  disease) ?No date: CHF (congestive heart failure) ([place]) ?No date: Chronic kidney disease ?No date: COPD (chronic obstructive pulmonary disease) ([place]) ?No date: Depression ?No date: Edema ?No date: Gout ?No date: Hip dislocation, bilateral ([place]) ?No date: Hypertension ?No date: Osteoporosis ?No date: Sleep apnea ? Reproductive/Obstetrics ? ?  ? ? ? ? ? ? ? ? ? ? ? ? ? ?  ?  ? ? ? ? ? ? ? ?Anesthesia Physical ?Anesthesia Plan ? ?ASA: 4 ? ?Anesthesia Plan: General  ? ?Post-op Pain Management: Minimal or no pain anticipated  ? ?Induction: Intravenous ? ?PONV Risk Score and Plan: 2 and Propofol infusion, TIVA and Ondansetron ? ?Airway Management Planned: Nasal Cannula ? ?Additional Equipment: None ? ?Intra-op Plan:  ? ?Post-operative Plan:  ? ?Informed Consent: I have reviewed the patients History and Physical, chart, labs and discussed the procedure including the risks, benefits and alternatives for the proposed anesthesia with the patient or authorized representative who has indicated his/her understanding and acceptance.  ? ? ? ?Dental advisory given ? ?Plan Discussed with: CRNA and Surgeon ? ?Anesthesia Plan Comments: (Discussed risks of anesthesia with patient, including possibility of difficulty with spontaneous ventilation under anesthesia necessitating airway intervention, PONV, and rare risks such as cardiac or respiratory or neurological events, and allergic reactions. Discussed the role of CRNA in patient's perioperative care. Patient understands. ?Patient counseled on being higher risk for anesthesia due to comorbidities: HFrEF. Patient was told about increased risk of cardiac and respiratory events, including death. ?)  ? ? ? ? ? ? ?Anesthesia Quick Evaluation ? ?

## 2021-10-26 NOTE — Anesthesia Postprocedure Evaluation (Signed)
Anesthesia Post Note ? ?Patient: Matthew [name] ? ?Procedure(s) Performed: CARDIOVERSION ? ?Patient location during evaluation: Specials Recovery ?Anesthesia Type: General ?Level of consciousness: awake and alert ?Pain management: pain level controlled ?Vital Signs Assessment: post-procedure vital signs reviewed and stable ?Respiratory status: spontaneous breathing, nonlabored ventilation, respiratory function stable and patient connected to nasal cannula oxygen ?Cardiovascular status: blood pressure returned to baseline and stable ?Postop Assessment: no apparent nausea or vomiting ?Anesthetic complications: no ? ? ?No notable events documented. ? ? ?Last Vitals:  ?Vitals:  ? 10/26/21 1227 10/26/21 1244  ?BP: 109/68 112/65  ?Pulse: 65 67  ?Resp: 17 18  ?Temp:  36.7 ?C  ?SpO2: 99% 99%  ?  ?Last Pain:  ?Vitals:  ? 10/26/21 1244  ?TempSrc:   ?PainSc: 0-No pain  ? ? ?  ?  ?  ?  ?  ?  ? ?Arita Miss ? ? ? ? ?

## 2021-10-26 NOTE — Procedures (Signed)
Cardioversion procedure note ?For atrial fibrillation. ? ?Procedure Details: ? ?Consent: Risks of procedure as well as the alternatives and risks of each were explained to the (patient/caregiver).  Consent for procedure obtained. ? ?Time Out: Verified patient identification, verified procedure, site/side was marked, verified correct patient position, special equipment/implants available, medications/allergies/relevent history reviewed, required imaging and test results available.  Performed ? ?Patient placed on cardiac monitor, pulse oximetry, supplemental oxygen as necessary.   ?Sedation given: propofol IV per anesthesia team ?Pacer pads placed anterior and posterior chest. ? ? ?Cardioverted 2 time(s).   ?Cardioverted at  200J. Synchronized biphasic ?Converted to NSR ? ? ?Evaluation: ?Findings: Post procedure EKG shows: NSR ?Complications: None ?Patient did tolerate procedure well. ? ?Time Spent Directly with the Patient: ? ?25 minutes  ? ?Kate Sable, M.D.  ?

## 2021-10-26 NOTE — Progress Notes (Signed)
OT Cancellation Note ? ?Patient Details ?Name: Matthew [name] ?MRN: [id number] ?DOB: Jun 23, 1951 ? ? ?Cancelled Treatment:    Reason Eval/Treat Not Completed: Medical issues which prohibited therapy (Pt s/p cardioversion this date.  Per Dr. Dwyane Dee, hold PT/OT services until 10/27/21) ? ?Shara Blazing, M.S., OTR/L ?[facility] - [facility] ?Ascom: [phone] ?10/26/21, 2:18 PM ? ?

## 2021-10-26 NOTE — Progress Notes (Signed)
PT Cancellation Note ? ?Patient Details ?Name: Matthew [name] ?MRN: [id number] ?DOB: [birth year] ? ? ?Cancelled Treatment:    Reason Eval/Treat Not Completed: Other (comment): Pt s/p cardioversion this date.  Per Dr. Dwyane Dee, hold PT/OT services until 10/27/21.   ? ? ?D. Royetta Asal PT, DPT ?10/26/21, 2:14 PM ? ?

## 2021-10-26 NOTE — Progress Notes (Signed)
? ?Progress Note ? ?Patient Name: Matthew [name] ?Date of Encounter: 10/26/2021 ? ?[facility] Cardiologist: Nelva Bush, MD  ? ?Subjective  ? ?Feels ok, denies palpitations. A little anxious about getting cardioversion. ? ?Inpatient Medications  ?  ?Scheduled Meds: ? [MAR Hold] apixaban  5 mg Oral BID  ? [MAR Hold] docusate sodium  200 mg Oral BID  ? [MAR Hold] DULoxetine  60 mg Oral BID  ? [MAR Hold] feeding supplement (GLUCERNA SHAKE)  237 mL Oral BID BM  ? [MAR Hold] furosemide  20 mg Oral Daily  ? [MAR Hold] melatonin  5 mg Oral QHS  ? [MAR Hold] metoprolol succinate  25 mg Oral BID  ? [MAR Hold] mirtazapine  15 mg Oral QHS  ? [MAR Hold] mometasone-formoterol  2 puff Inhalation BID  ? [MAR Hold] multivitamin with minerals  1 tablet Oral Daily  ? [MAR Hold] Ensure Max Protein  11 oz Oral QHS  ? [MAR Hold] tamsulosin  0.4 mg Oral QPC supper  ? ?Continuous Infusions: ? sodium chloride Stopped (10/26/21 1151)  ? sodium chloride    ? sodium chloride 20 mL/hr at 10/26/21 0900  ? amiodarone 30 mg/hr (10/26/21 0900)  ? ?PRN Meds: ?[MAR Hold] acetaminophen, [MAR Hold] levalbuterol, [MAR Hold] ondansetron (ZOFRAN) IV, [MAR Hold] traMADol  ? ?Vital Signs  ?  ?Vitals:  ? 10/26/21 1149 10/26/21 1150 10/26/21 1153 10/26/21 1200  ?BP:   (!) 101/59   ?Pulse: 61 62 71 60  ?Resp: (!) 22 (!) '29 19 19  '$ ?Temp:      ?TempSrc:      ?SpO2: 100% 100% 100% 94%  ?Weight:      ?Height:      ? ? ?Intake/Output Summary (Last 24 hours) at 10/26/2021 1203 ?Last data filed at 10/26/2021 1151 ?Gross per 24 hour  ?Intake 1300.38 ml  ?Output 1950 ml  ?Net -649.62 ml  ? ?Last 3 Weights 10/26/2021 10/26/2021 10/25/2021  ?Weight (lbs) 280 lb 281 lb 1.4 oz 285 lb 4.4 oz  ?Weight (kg) 127.007 kg 127.5 kg 129.4 kg  ?   ? ?Telemetry  ?  ?Precardioversion telemetry shows Atrial fibrillation, heart rate 106- Personally Reviewed ? ?Post cardioversion: Telemetry shows sinus bradycardia heart rate 56 ? ?ECG  ?  ? - Personally Reviewed ? ?Physical Exam   ? ?GEN: No acute distress.   ?Neck: No JVD ?Cardiac: Regular rate and rhythm, bradycardic ?Respiratory: Mild expiratory wheezing ?GI: Soft, nontender, distended  ?MS: No edema; No deformity. ?Neuro:  Nonfocal  ?Psych: Normal affect  ? ?Labs  ?  ?High Sensitivity Troponin:   ?Recent Labs  ?Lab 10/17/21 ?1915 10/17/21 ?2116  ?TROPONINIHS 15 13  ?   ?Chemistry ?Recent Labs  ?Lab 10/24/21 ?6712 10/25/21 ?4580 10/26/21 ?0541  ?NA 138 137 137  ?K 4.9 5.1 5.1  ?CL 100 101 101  ?CO2 '28 29 30  '$ ?GLUCOSE 95 106* 97  ?BUN 58* 61* 67*  ?CREATININE 2.74* 2.56* 2.62*  ?CALCIUM 9.6 9.4 9.8  ?MG  --  2.4 2.3  ?GFRNONAA 24* 26* 25*  ?ANIONGAP '10 7 6  '$ ?  ?Lipids No results for input(s): CHOL, TRIG, HDL, LABVLDL, LDLCALC, CHOLHDL in the last 168 hours.  ?Hematology ?Recent Labs  ?Lab 10/24/21 ?9983 10/25/21 ?3825 10/26/21 ?0541  ?WBC 4.7 4.2 5.1  ?RBC 3.09* 3.20* 3.30*  ?HGB 8.3* 8.6* 8.9*  ?HCT 29.6* 30.6* 31.5*  ?MCV 95.8 95.6 95.5  ?MCH 26.9 26.9 27.0  ?MCHC 28.0* 28.1* 28.3*  ?RDW 14.8 14.7  14.7  ?PLT 294 284 287  ? ?Thyroid No results for input(s): TSH, FREET4 in the last 168 hours.  ?BNP ?No results for input(s): BNP, PROBNP in the last 168 hours. ?  ?DDimer No results for input(s): DDIMER in the last 168 hours.  ? ?Radiology  ?  ?ECHO TEE ? ?Result Date: 10/25/2021 ?   TRANSESOPHOGEAL ECHO REPORT   Patient Name:   TUCK DULWORTH Manning Date of Exam: 10/24/2021 Medical Rec #:  [id number]      Height:       74.0 in Accession #:    [id number]     Weight:       279.1 lb Date of Birth:  08-23-1950       BSA:          2.504 m? Patient Age:    71 years       BP:           107/76 mmHg Patient Gender: M              HR:           104 bpm. Exam Location:  [facility] Procedure: Transesophageal Echo, Cardiac Doppler and Color Doppler Indications:     Atrial Fibrillation I48.91  History:         Patient has prior history of Echocardiogram examinations, most                  recent 10/18/2021. CHF, CAD, COPD; Risk Factors:Hypertension.  Sonographer:     Sherrie Sport Referring Phys:  [id number] Clance Boll BERGE Diagnosing Phys: Kate Sable MD PROCEDURE: The transesophogeal probe was passed without difficulty through the esophogus of the patient. Sedation performed by different physician. The patient developed no complications during the procedure. An unsuccessful direct current cardioversion was performed at 200 joules with 2 attempts. IMPRESSIONS  1. Left ventricular ejection fraction, by estimation, is 30 to 35%. The left ventricle has moderate to severely decreased function. The left ventricle demonstrates global hypokinesis.  2. Right ventricular systolic function is low normal. The right ventricular size is mildly enlarged.  3. Left atrial size was moderately dilated. No left atrial/left atrial appendage thrombus was detected.  4. Right atrial size was mildly dilated.  5. The mitral valve is normal in structure. Mild mitral valve regurgitation.  6. Tricuspid valve regurgitation is mild to moderate.  7. The aortic valve is tricuspid. Aortic valve regurgitation is not visualized. FINDINGS  Left Ventricle: Left ventricular ejection fraction, by estimation, is 30 to 35%. The left ventricle has moderate to severely decreased function. The left ventricle demonstrates global hypokinesis. The left ventricular internal cavity size was normal in size. Right Ventricle: The right ventricular size is mildly enlarged. No increase in right ventricular wall thickness. Right ventricular systolic function is low normal. Left Atrium: Left atrial size was moderately dilated. No left atrial/left atrial appendage thrombus was detected. Right Atrium: Right atrial size was mildly dilated. Pericardium: There is no evidence of pericardial effusion. Mitral Valve: The mitral valve is normal in structure. Mild mitral valve regurgitation. Tricuspid Valve: The tricuspid valve is normal in structure. Tricuspid valve regurgitation is mild to moderate. Aortic Valve: The aortic valve is  tricuspid. Aortic valve regurgitation is not visualized. Pulmonic Valve: The pulmonic valve was grossly normal. Pulmonic valve regurgitation is not visualized. Aorta: The aortic root is normal in size and structure. IAS/Shunts: No atrial level shunt detected by color flow Doppler. Kate Sable MD Electronically signed by Kate Sable MD Signature  Date/Time: 10/25/2021/8:09:12 AM    Final    ? ?Cardiac Studies  ? ?10/18/2020 ?1. Left ventricular ejection fraction, by estimation, is 35 to 40%. The  ?left ventricle has moderately decreased function. Left ventricular  ?endocardial border not optimally defined to evaluate regional wall motion.  ?The left ventricular internal cavity  ?size was mildly to moderately dilated. There is moderate left ventricular  ?hypertrophy. Left ventricular diastolic function could not be evaluated.  ? 2. Right ventricular systolic function is normal. The right ventricular  ?size is mildly enlarged. Mildly increased right ventricular wall  ?thickness.  ? 3. Left atrial size was mildly dilated.  ? 4. Right atrial size was mildly dilated.  ? 5. The mitral valve was not well visualized. Mild mitral valve  ?regurgitation.  ? 6. The aortic valve is tricuspid. Aortic valve regurgitation is not  ?visualized. No aortic stenosis is present.  ? 7. The inferior vena cava is dilated in size with >50% respiratory  ?variability, suggesting right atrial pressure of 8 mmHg.  ? ?Patient Profile  ?   ?71 y.o. male history of nonischemic cardiomyopathy EF 35 to 40%, paroxysmal atrial fibrillation, asthma COPD, chronic respiratory failure, obesity presenting with shortness of breath and palpitations, being seen for persistent atrial fibrillation ? ?Assessment & Plan  ?  ?A-fib RVR ?-S/p DC cardioversion x2 successful ?-Continue amiodarone drip for today ?-Start p.o. amiodarone tomorrow if patient maintains sinus rhythm ?-Continue Toprol-XL 25 mg twice daily, Eliquis 5 mg twice daily ? ?2.   Cardiomyopathy,  ?-likely nonischemic/tachycardia induced ?-Toprol-XL as above ?-Low blood pressures preventing use of GDMT ?-Lasix 20 mg daily ?-Consider repeat echo as outpatient if patient maintains sinus rhythm. ? ?3.  Ast

## 2021-10-26 NOTE — Transfer of Care (Signed)
Immediate Anesthesia Transfer of Care Note ? ?Patient: Matthew [name] ? ?Procedure(s) Performed: CARDIOVERSION ? ?Patient Location: PACU ? ?Anesthesia Type:General ? ?Level of Consciousness: awake and alert  ? ?Airway & Oxygen Therapy: Patient Spontanous Breathing and Patient connected to face mask oxygen ? ?Post-op Assessment: Report given to RN and Post -op Vital signs reviewed and stable ? ?Post vital signs: Reviewed and stable ? ?Last Vitals:  ?Vitals Value Taken Time  ?BP 102/69 10/26/21 1148  ?Temp    ?Pulse 62 10/26/21 1150  ?Resp 26 10/26/21 1151  ?SpO2 100 % 10/26/21 1150  ?Vitals shown include unvalidated device data. ? ?Last Pain:  ?Vitals:  ? 10/26/21 1123  ?TempSrc: Oral  ?PainSc: 0-No pain  ?   ? ?Patients Stated Pain Goal: 0 (10/22/21 2575) ? ?Complications: No notable events documented. ?

## 2021-10-26 NOTE — Progress Notes (Signed)
?PROGRESS NOTE ? ? ? ?Matthew [name]  UJW:[id number] DOB: 04-27-1951 DOA: 10/17/2021 ? ?PCP: [name], MD  ? ?Brief Narrative:  ?This 71 years old male with PMH significant for COPD, OSA not on CPAP, class IV obesity, CKD stage IV, hypertension, A-fib on Eliquis, diastolic CHF with last exacerbation in October 2022 presents in the ED with worsening shortness of breath.  Patient was placed on BiPAP.  Patient was severely hypoxic requiring BiPAP on arrival.  EKG shows A-fib with RVR.  Patient was started on amiodarone infusion.  Patient was also started on doxycycline for possible bronchitis.Patient was admitted for A. Fib with RVR.  Cardiology consulted. Patient is status post cardioversion x 2 but it was unsuccessful.  Patient continued to remain in A-fib.  Patient was started on amiodarone infusion and is scheduled to have repeat cardioversion 10/26/2021. ? ?Assessment & Plan: ?  ?Principal Problem: ?  Rapid atrial fibrillation ([place]) ?Active Problems: ?  Acute on chronic respiratory failure with hypoxia (HCC) ?  Acute on chronic diastolic CHF (congestive heart failure) ([place]) ?  Hypertension ?  COPD (chronic obstructive pulmonary disease) ([place]) ?  CKD (chronic kidney disease) stage 4, GFR 15-29 ml/min (HCC) ?  Obesity, Class III, BMI 40-49.9 (morbid obesity) ([place]) ?  OSA (obstructive sleep apnea) ?  NICM (nonischemic cardiomyopathy) ([place]) ? ?Paroxysmal A-fib with RVR: ?Now persistent atrial fibrillation. ?Patient has presented with severe shortness of breath, EKG shows A-fib with RVR. ?Likely due to medication noncompliance.  Patient was started on amiodarone infusion given low blood pressure.  Patient was not given Cardizem.  Resumed on metoprolol and Eliquis. ?S/p cardioversion x 2 which was unsuccessful as EKG still shows persistent A-fib on 10/24/2021. ?Patient is scheduled to have repeat cardioversion today. ? ?Acute on chronic systolic CHF: ?Acute hypoxic respiratory failure. ?He presented with severe shortness  of breath requiring BiPAP on arrival. ?SPO2 88% on room air on arrival requiring BiPAP. ?Weaned off of the BiPAP now, continue supplemental oxygen. ?Continue IV Lasix. ?Daily weight, continue intake output charting. ? ? ?Intake/Output Summary (Last 24 hours) at 10/26/2021 1327 ?Last data filed at 10/26/2021 1248 ?Gross per 24 hour  ?Intake 1300.38 ml  ?Output 1600 ml  ?Net -299.62 ml  ?  ?Essential hypertension: ?Continue metoprolol. ? ?COPD: ?No signs of exacerbation. ?Continue bronchodilators and encourage incentive spirometry. ? ?AKI on CKD stage IV: ?Baseline serum creatinine 2.35. ?Serum creatinine trending down.  3.14>2.97>2.52>2.62 ?Avoid nephrotoxic medications.  Good urine output. ? ?OSA: ?Not using CPAP at home. ? ?BPH: ?Continue Flomax ? ?Depression: ?Continue duloxetine, Remeron ? ?Gout:  ?Patient had stopped allopurinol on self. ? ?Obesity: ?Diet and exercise discussed in detail. ? ? ?DVT prophylaxis: Eliquis ?Code Status: Full code ?Family Communication: No family at bedside ?Disposition Plan:  ? ?Status is: Inpatient ?Remains inpatient appropriate because: Admitted for A-fib with RVR.  Underwent cardioversion twice which was unsuccessful.  Scheduled to have cardioversion today. ?  ? ?Consultants:  ?Cardiology ? ?Procedures: Scheduled cardioversion today ?Antimicrobials:  ?Anti-infectives (From admission, onward)  ? ? Start     Dose/Rate Route Frequency Ordered Stop  ? 10/18/21 1000  doxycycline (VIBRA-TABS) tablet 100 mg  Status:  Discontinued       ? 100 mg Oral Every 12 hours 10/17/21 2327 10/18/21 0859  ? 10/17/21 2100  doxycycline (VIBRAMYCIN) 100 mg in sodium chloride 0.9 % 250 mL IVPB       ? 100 mg ?125 mL/hr over 120 Minutes Intravenous  Once 10/17/21 2031 10/18/21 0259  ?  10/17/21 2045  cefTRIAXone (ROCEPHIN) 2 g in sodium chloride 0.9 % 100 mL IVPB       ? 2 g ?200 mL/hr over 30 Minutes Intravenous  Once 10/17/21 2031 10/17/21 2155  ? ?  ?  ? ? ?Subjective: ?Patient was seen and examined at  bedside.  Overnight events noted. ?Patient reports feeling anxious because of having cardioversion today.  It was unsuccessful in the past so that make him more worried and concerned. ? ?Objective: ?Vitals:  ? 10/26/21 1200 10/26/21 1215 10/26/21 1227 10/26/21 1244  ?BP: 100/61 104/65 109/68 112/65  ?Pulse: 60 70 65 67  ?Resp: '19 20 17 18  '$ ?Temp:    98 ?F (36.7 ?C)  ?TempSrc:      ?SpO2: 94% 96% 99% 99%  ?Weight:      ?Height:      ? ? ?Intake/Output Summary (Last 24 hours) at 10/26/2021 1327 ?Last data filed at 10/26/2021 1248 ?Gross per 24 hour  ?Intake 1300.38 ml  ?Output 1600 ml  ?Net -299.62 ml  ? ?Filed Weights  ? 10/25/21 2358 10/26/21 0410 10/26/21 0813  ?Weight: 129.4 kg 127.5 kg 127 kg  ? ? ?Examination: ? ?General exam: Appears comfortable, not in any acute distress seems anxious. ?Respiratory system: Clear to auscultation. Respiratory effort normal.  Normal respiratory effort. ?Cardiovascular system: S1 & S2 heard, irregular rhythm, no murmur. ?Gastrointestinal system: Abdomen is nondistended, soft and nontender.Normal bowel sounds heard. ?Central nervous system: Alert and oriented x 3. No focal neurological deficits. ?Extremities: No edema, no cyanosis, no clubbing. ?Skin: No rashes, lesions or ulcers ?Psychiatry: Judgement and insight appear normal. Mood & affect appropriate.  ? ? ? ?Data Reviewed: I have personally reviewed following labs and imaging studies ? ?CBC: ?Recent Labs  ?Lab 10/22/21 ?0923 10/23/21 ?3007 10/24/21 ?6226 10/25/21 ?3335 10/26/21 ?0541  ?WBC 4.3 4.3 4.7 4.2 5.1  ?HGB 8.0* 8.1* 8.3* 8.6* 8.9*  ?HCT 28.0* 28.3* 29.6* 30.6* 31.5*  ?MCV 93.3 94.0 95.8 95.6 95.5  ?PLT 267 261 294 284 287  ? ?Basic Metabolic Panel: ?Recent Labs  ?Lab 10/22/21 ?4562 10/23/21 ?5638 10/24/21 ?9373 10/25/21 ?4287 10/26/21 ?0541  ?NA 136 137 138 137 137  ?K 4.5 4.7 4.9 5.1 5.1  ?CL 100 102 100 101 101  ?CO2 '29 30 28 29 30  '$ ?GLUCOSE 122* 112* 95 106* 97  ?BUN 53* 60* 58* 61* 67*  ?CREATININE 2.97* 3.20* 2.74*  2.56* 2.62*  ?CALCIUM 9.0 9.1 9.6 9.4 9.8  ?MG  --   --   --  2.4 2.3  ? ?GFR: ?Estimated Creatinine Clearance: 36.6 mL/min (A) (by C-G formula based on SCr of 2.62 mg/dL (H)). ?Liver Function Tests: ?No results for input(s): AST, ALT, ALKPHOS, BILITOT, PROT, ALBUMIN in the last 168 hours. ?No results for input(s): LIPASE, AMYLASE in the last 168 hours. ?No results for input(s): AMMONIA in the last 168 hours. ?Coagulation Profile: ?No results for input(s): INR, PROTIME in the last 168 hours. ?Cardiac Enzymes: ?No results for input(s): CKTOTAL, CKMB, CKMBINDEX, TROPONINI in the last 168 hours. ?BNP (last 3 results) ?No results for input(s): PROBNP in the last 8760 hours. ?HbA1C: ?No results for input(s): HGBA1C in the last 72 hours. ?CBG: ?Recent Labs  ?Lab 10/19/21 ?1647  ?GLUCAP 120*  ? ?Lipid Profile: ?No results for input(s): CHOL, HDL, LDLCALC, TRIG, CHOLHDL, LDLDIRECT in the last 72 hours. ?Thyroid Function Tests: ?No results for input(s): TSH, T4TOTAL, FREET4, T3FREE, THYROIDAB in the last 72 hours. ?Anemia Panel: ?No results for input(s):  VITAMINB12, FOLATE, FERRITIN, TIBC, IRON, RETICCTPCT in the last 72 hours. ?Sepsis Labs: ?No results for input(s): PROCALCITON, LATICACIDVEN in the last 168 hours. ? ?Recent Results (from the past 240 hour(s))  ?Resp Panel by RT-PCR (Flu A&B, Covid) Nasopharyngeal Swab     Status: None  ? Collection Time: 10/17/21  7:16 PM  ? Specimen: Nasopharyngeal Swab; Nasopharyngeal(NP) swabs in vial transport medium  ?Result Value Ref Range Status  ? SARS Coronavirus 2 by RT PCR NEGATIVE NEGATIVE Final  ?  Comment: (NOTE) ?SARS-CoV-2 target nucleic acids are NOT DETECTED. ? ?The SARS-CoV-2 RNA is generally detectable in upper respiratory ?specimens during the acute phase of infection. The lowest ?concentration of SARS-CoV-2 viral copies this assay can detect is ?138 copies/mL. A negative result does not preclude SARS-Cov-2 ?infection and should not be used as the sole basis for  treatment or ?other patient management decisions. A negative result may occur with  ?improper specimen collection/handling, submission of specimen other ?than nasopharyngeal swab, presence of viral mutation(

## 2021-10-26 NOTE — Plan of Care (Signed)

## 2021-10-27 ENCOUNTER — Encounter: Payer: Self-pay | Admitting: Cardiology

## 2021-10-27 DIAGNOSIS — I4891 Unspecified atrial fibrillation: Secondary | ICD-10-CM | POA: Diagnosis not present

## 2021-10-27 DIAGNOSIS — I5033 Acute on chronic diastolic (congestive) heart failure: Secondary | ICD-10-CM | POA: Diagnosis not present

## 2021-10-27 DIAGNOSIS — J9621 Acute and chronic respiratory failure with hypoxia: Secondary | ICD-10-CM | POA: Diagnosis not present

## 2021-10-27 DIAGNOSIS — N184 Chronic kidney disease, stage 4 (severe): Secondary | ICD-10-CM | POA: Diagnosis not present

## 2021-10-27 LAB — BASIC METABOLIC PANEL
Anion gap: 8 (ref 5–15)
BUN: 65 mg/dL — ABNORMAL HIGH (ref 8–23)
CO2: 27 mmol/L (ref 22–32)
Calcium: 9.4 mg/dL (ref 8.9–10.3)
Chloride: 101 mmol/L (ref 98–111)
Creatinine, Ser: 2.63 mg/dL — ABNORMAL HIGH (ref 0.61–1.24)
GFR, Estimated: 25 mL/min — ABNORMAL LOW (ref >60)
Glucose, Bld: 98 mg/dL (ref 70–99)
Potassium: 4.9 mmol/L (ref 3.5–5.1)
Sodium: 136 mmol/L (ref 135–145)

## 2021-10-27 LAB — PHOSPHORUS: Phosphorus: 4.8 mg/dL — ABNORMAL HIGH (ref 2.5–4.6)

## 2021-10-27 LAB — MAGNESIUM: Magnesium: 2.5 mg/dL — ABNORMAL HIGH (ref 1.7–2.4)

## 2021-10-27 MED ORDER — METOPROLOL SUCCINATE ER 25 MG PO TB24
25.0000 mg | ORAL_TABLET | Freq: Every day | ORAL | Status: DC
  Administered 2021-10-28: 25 mg via ORAL
  Filled 2021-10-27: qty 1

## 2021-10-27 MED ORDER — AMIODARONE HCL 200 MG PO TABS
400.0000 mg | ORAL_TABLET | Freq: Two times a day (BID) | ORAL | Status: DC
  Administered 2021-10-27 – 2021-10-29 (×5): 400 mg via ORAL
  Filled 2021-10-27 (×5): qty 2

## 2021-10-27 NOTE — Progress Notes (Signed)
Nutrition Follow-up ? ?DOCUMENTATION CODES:  ? ?Obesity unspecified ? ?INTERVENTION:  ? ?-Continue Glucerna Shake po BID, each supplement provides 220 kcal and 10 grams of protein  ?-Continue Ensure Max po daily, each supplement provides 150 kcal and 30 grams of protein ?-Continue MVI with minerals daily ? ?NUTRITION DIAGNOSIS:  ? ?Increased nutrient needs related to chronic illness (COPD) as evidenced by estimated needs. ? ?Ongoing ? ?GOAL:  ? ?Patient will meet greater than or equal to 90% of their needs ? ?Progressing  ? ?MONITOR:  ? ?PO intake, Supplement acceptance, Labs, Weight trends, Skin, I & O's ? ?REASON FOR ASSESSMENT:  ? ?Consult ?Assessment of nutrition requirement/status ? ?ASSESSMENT:  ? ?Matthew [name] is a 71 y.o. male with medical history significant for COPD, OSA not on CPAP, prior home O2 use, class IV obesity, CKD stage IV, HTN, A-fib on Eliquis, diastolic CHF with last exacerbation in October 2022, who presents to the ED by EMS with respiratory distress, arriving on CPAP.  Patient states he was in his usual state of health until the day prior when he started having shortness of breath which significantly worsened over the next 24 hours.  He has associated nonradiating retrosternal tightness, worse with breathing.  Denies lower extremity edema.  Has some orthopnea.  Has a nonproductive cough but no fever or chills.  No affected contacts ? ?3/13- s/p TEE, unsuccessful cardioversion ?3/15- s/p cardioversion ? ?Reviewed I/O's: -620 ml x 24 hours and -8.1 L since admission ? ?UOP: 1.3 L x 24 hours ? ?Pt sleeping soundly at time of visit.  ? ?Pt continues to have a great appetite. Noted meal completions 100%. Pt is also taking Glucerna and Ensure Max supplements.  ?  ?Medications reviewed and include lasix, remeron, and melatonin.  ? ?Labs reviewed: CBGS: 120 (inpatient orders for glycemic control are none).   ? ?Diet Order:   ?Diet Order   ? ?       ?  Diet 2 gram sodium Room service appropriate?  Yes; Fluid consistency: Thin  Diet effective now       ?  ? ?  ?  ? ?  ? ? ?EDUCATION NEEDS:  ? ?No education needs have been identified at this time ? ?Skin:  Skin Assessment: Skin Integrity Issues: ?Skin Integrity Issues:: Other (Comment) ?Other: laceration to rt ear ? ?Last BM:  10/23/21 ? ?Height:  ? ?Ht Readings from Last 1 Encounters:  ?10/24/21 '6\' 2"'$  (1.88 m)  ? ? ?Weight:  ? ?Wt Readings from Last 1 Encounters:  ?10/27/21 126.6 kg  ? ?BMI:  Body mass index is 35.83 kg/m?. ? ?Estimated Nutritional Needs:  ? ?Kcal:  2200-2400 ? ?Protein:  120-130 grams ? ?Fluid:  > 2 L ? ? ? ?Loistine Chance, RD, LDN, CDCES ?Registered Dietitian II ?Certified Diabetes Care and Education Specialist ?Please refer to [facility] for RD and/or RD on-call/weekend/after hours pager  ?

## 2021-10-27 NOTE — Progress Notes (Signed)
?PROGRESS NOTE ? ? ? ?Matthew [name]  LGX:[id number] DOB: 1951/01/28 DOA: 10/17/2021 ? ?PCP: [name], MD  ? ?Brief Narrative:  ?This 71 years old male with PMH significant for COPD, OSA not on CPAP, class IV obesity, CKD stage IV, hypertension, A-fib on Eliquis, diastolic CHF with last exacerbation in October 2022 presents in the ED with worsening shortness of breath.  Patient was placed on BiPAP.  Patient was severely hypoxic requiring BiPAP on arrival.  EKG shows A-fib with RVR.  Patient was started on amiodarone infusion.  Patient was also started on doxycycline for possible bronchitis. ?Patient was admitted for A. Fib with RVR.  Cardiology consulted. Patient is status post cardioversion 10/24/21  but it was unsuccessful.  Patient continued to remain in A-fib.  Patient was started on amiodarone infusion and underwent repeat cardioversion 10/26/2021.  Patient now remains in normal sinus rhythm.  Amiodarone infusion discontinued. ? ?Assessment & Plan: ?  ?Principal Problem: ?  Rapid atrial fibrillation ([place]) ?Active Problems: ?  Acute on chronic respiratory failure with hypoxia (HCC) ?  Acute on chronic diastolic CHF (congestive heart failure) ([place]) ?  Hypertension ?  COPD (chronic obstructive pulmonary disease) ([place]) ?  CKD (chronic kidney disease) stage 4, GFR 15-29 ml/min (HCC) ?  Obesity, Class III, BMI 40-49.9 (morbid obesity) ([employer]) ?  OSA (obstructive sleep apnea) ?  NICM (nonischemic cardiomyopathy) ([place]) ? ?Paroxysmal A-fib with RVR: ?Patient has presented with severe shortness of breath, EKG shows A-fib with RVR. ?Likely due to medication noncompliance.  Patient was started on amiodarone infusion given low blood pressure.   ?Patient was not given Cardizem.  Resumed on metoprolol and Eliquis. ?S/p cardioversion on 10/24/21  which was unsuccessful as EKG still shows persistent A-fib. ?He underwent successful cardioversion to NSR on 10/26/2021. ?He remains in normal sinus rhythm but heart rate remains between  40-60. ?Cardiology recommended to discontinue amiodarone infusion. ? ?Acute on chronic systolic CHF: ?Acute hypoxic respiratory failure. ?He presented with severe shortness of breath requiring BiPAP on arrival. ?SPO2 88% on room air on arrival requiring BiPAP. ?Weaned off of the BiPAP now, continue supplemental oxygen, remains on 2L/m ?Continue IV Lasix 20 mg daily. ?Daily weight, continue intake output charting. ? ? ?Intake/Output Summary (Last 24 hours) at 10/27/2021 1157 ?Last data filed at 10/27/2021 1024 ?Gross per 24 hour  ?Intake 792.16 ml  ?Output 1125 ml  ?Net -332.84 ml  ?  ?Essential hypertension: ?Continue metoprolol. ? ?COPD: ?No signs of exacerbation. ?Continue bronchodilators and encourage incentive spirometry. ? ?AKI on CKD stage IV: ?Baseline serum creatinine 2.35. ?Serum creatinine trending down.  3.14>2.97>2.52>2.62 ?Avoid nephrotoxic medications.  Good urine output. ? ?OSA: ?Not using CPAP at home. ? ?BPH: ?Continue Flomax ? ?Depression: ?Continue duloxetine, Remeron ? ?Gout:  ?Patient had stopped allopurinol on self. ? ?Obesity: ?Diet and exercise discussed in detail. ? ? ?DVT prophylaxis: Eliquis ?Code Status: Full code ?Family Communication: No family at bedside ?Disposition Plan:  ? ?Status is: Inpatient ?Remains inpatient appropriate because:  ?Admitted for A-fib with RVR.  Underwent cardioversion on 3/13 which was unsuccessful.  He underwent successful cardioversion to NSR on 10/26/2021.  He now remains in normal sinus rhythm.  Cardiology is following. ? ?Anticipated discharge home in 1 to 2 days. ? ? ?Consultants:  ?Cardiology ?Nephrology ? ?Procedures:  Cardioversion x 2 ?Antimicrobials:  ?Anti-infectives (From admission, onward)  ? ? Start     Dose/Rate Route Frequency Ordered Stop  ? 10/18/21 1000  doxycycline (VIBRA-TABS) tablet 100 mg  Status:  Discontinued       ? 100 mg Oral Every 12 hours 10/17/21 2327 10/18/21 0859  ? 10/17/21 2100  doxycycline (VIBRAMYCIN) 100 mg in sodium chloride  0.9 % 250 mL IVPB       ? 100 mg ?125 mL/hr over 120 Minutes Intravenous  Once 10/17/21 2031 10/18/21 0259  ? 10/17/21 2045  cefTRIAXone (ROCEPHIN) 2 g in sodium chloride 0.9 % 100 mL IVPB       ? 2 g ?200 mL/hr over 30 Minutes Intravenous  Once 10/17/21 2031 10/17/21 2155  ? ?  ?  ? ? ?Subjective: ?Patient was seen and examined at bedside.  Overnight events noted. ?Patient reports feeling better, remains in normal sinus rhythm.  Denies any chest pain and palpitation ?He continues to remain on 2 L of supplemental oxygen. ? ?Objective: ?Vitals:  ? 10/27/21 0410 10/27/21 0514 10/27/21 0818 10/27/21 1040  ?BP: (!) 96/53  121/73   ?Pulse: 61  (!) 59   ?Resp:   18   ?Temp: 98 ?F (36.7 ?C)  97.8 ?F (36.6 ?C)   ?TempSrc:   Oral   ?SpO2: 100%  100%   ?Weight:  130.3 kg  126.6 kg  ?Height:      ? ? ?Intake/Output Summary (Last 24 hours) at 10/27/2021 1157 ?Last data filed at 10/27/2021 1024 ?Gross per 24 hour  ?Intake 792.16 ml  ?Output 1125 ml  ?Net -332.84 ml  ? ?Filed Weights  ? 10/26/21 0813 10/27/21 0514 10/27/21 1040  ?Weight: 127 kg 130.3 kg 126.6 kg  ? ? ?Examination: ? ?General exam: Appears comfortable, not in any acute distress. ?Respiratory system: CTA bilaterally, respiratory effort normal, no accessory muscle use. ?Cardiovascular system: S1-S2 heard, regular rate and rhythm, no murmur. ?Gastrointestinal system: Abdomen is non distended, soft and non tender.Normal bowel sounds heard. ?Central nervous system: Alert and oriented x 3. No focal neurological deficits. ?Extremities: No edema, no cyanosis, no clubbing. ?Skin: No rashes, lesions or ulcers ?Psychiatry: Judgement and insight appear normal. Mood & affect appropriate.  ? ? ? ?Data Reviewed: I have personally reviewed following labs and imaging studies ? ?CBC: ?Recent Labs  ?Lab 10/22/21 ?6606 10/23/21 ?3016 10/24/21 ?0109 10/25/21 ?3235 10/26/21 ?0541  ?WBC 4.3 4.3 4.7 4.2 5.1  ?HGB 8.0* 8.1* 8.3* 8.6* 8.9*  ?HCT 28.0* 28.3* 29.6* 30.6* 31.5*  ?MCV 93.3 94.0  95.8 95.6 95.5  ?PLT 267 261 294 284 287  ? ?Basic Metabolic Panel: ?Recent Labs  ?Lab 10/23/21 ?5732 10/24/21 ?2025 10/25/21 ?4270 10/26/21 ?6237 10/27/21 ?0503  ?NA 137 138 137 137 136  ?K 4.7 4.9 5.1 5.1 4.9  ?CL 102 100 101 101 101  ?CO2 '30 28 29 30 27  '$ ?GLUCOSE 112* 95 106* 97 98  ?BUN 60* 58* 61* 67* 65*  ?CREATININE 3.20* 2.74* 2.56* 2.62* 2.63*  ?CALCIUM 9.1 9.6 9.4 9.8 9.4  ?MG  --   --  2.4 2.3 2.5*  ?PHOS  --   --   --   --  4.8*  ? ?GFR: ?Estimated Creatinine Clearance: 36.4 mL/min (A) (by C-G formula based on SCr of 2.63 mg/dL (H)). ?Liver Function Tests: ?No results for input(s): AST, ALT, ALKPHOS, BILITOT, PROT, ALBUMIN in the last 168 hours. ?No results for input(s): LIPASE, AMYLASE in the last 168 hours. ?No results for input(s): AMMONIA in the last 168 hours. ?Coagulation Profile: ?No results for input(s): INR, PROTIME in the last 168 hours. ?Cardiac Enzymes: ?No results for input(s): CKTOTAL, CKMB, CKMBINDEX, TROPONINI in the last  168 hours. ?BNP (last 3 results) ?No results for input(s): PROBNP in the last 8760 hours. ?HbA1C: ?No results for input(s): HGBA1C in the last 72 hours. ?CBG: ?No results for input(s): GLUCAP in the last 168 hours. ? ?Lipid Profile: ?No results for input(s): CHOL, HDL, LDLCALC, TRIG, CHOLHDL, LDLDIRECT in the last 72 hours. ?Thyroid Function Tests: ?No results for input(s): TSH, T4TOTAL, FREET4, T3FREE, THYROIDAB in the last 72 hours. ?Anemia Panel: ?No results for input(s): VITAMINB12, FOLATE, FERRITIN, TIBC, IRON, RETICCTPCT in the last 72 hours. ?Sepsis Labs: ?No results for input(s): PROCALCITON, LATICACIDVEN in the last 168 hours. ? ?Recent Results (from the past 240 hour(s))  ?Resp Panel by RT-PCR (Flu A&B, Covid) Nasopharyngeal Swab     Status: None  ? Collection Time: 10/17/21  7:16 PM  ? Specimen: Nasopharyngeal Swab; Nasopharyngeal(NP) swabs in vial transport medium  ?Result Value Ref Range Status  ? SARS Coronavirus 2 by RT PCR NEGATIVE NEGATIVE Final  ?   Comment: (NOTE) ?SARS-CoV-2 target nucleic acids are NOT DETECTED. ? ?The SARS-CoV-2 RNA is generally detectable in upper respiratory ?specimens during the acute phase of infection. The lowest ?concentrati

## 2021-10-27 NOTE — Progress Notes (Signed)
Physical Therapy Treatment ?Patient Details ?Name: Matthew [name] ?MRN: [id number] ?DOB: [birth year] ?Today's Date: 10/27/2021 ? ? ?History of Present Illness Per MD Notes: Matthew [name] is a 71 y.o. male with medical history significant for COPD, OSA not on CPAP, prior home O2 use, class IV obesity, CKD stage IV, HTN, A-fib on Eliquis, diastolic CHF with last exacerbation in October 2022, who presents to the ED by EMS with respiratory distress, chest pain, arriving on CPAP. ? ?  ?PT Comments  ? ? Pt resting in bed upon PT arrival; agreeable to PT session.  No c/o pain during session.  SBA semi-supine to sitting edge of bed; max assist to stand from mildly elevated bed height up to walker; CGA to stand from recliner (recliner seat height elevated via use of pillows and pt used B armrests to assist with standing); and CGA to min assist to ambulate a few feet bed to recliner with walker use.  Limited activity (deferred further ambulation) d/t pt very SOB with minimal activity and pt requiring extended sitting rest break to recover breathing (O2 sats 94% or greater on 2 L O2 via nasal cannula during sessions activities).  HR 62 bpm at rest beginning of session; HR increased to 77 bpm with activity; and HR 58 bpm at rest end of session.  Will continue to focus on strengthening, balance, and progressive functional mobility during hospitalization. ?  ?Recommendations for follow up therapy are one component of a multi-disciplinary discharge planning process, led by the attending physician.  Recommendations may be updated based on patient status, additional functional criteria and insurance authorization. ? ?Follow Up Recommendations ? Skilled nursing-short term rehab (<3 hours/day) ?  ?  ?Assistance Recommended at Discharge Frequent or constant Supervision/Assistance  ?Patient can return home with the following Two people to help with walking and/or transfers;Assistance with cooking/housework;Help with stairs or ramp for  entrance;Two people to help with bathing/dressing/bathroom;Assist for transportation ?  ?Equipment Recommendations ? BSC/3in1;Rolling walker (2 wheels);Wheelchair (measurements PT);Wheelchair cushion (measurements PT);Hospital bed (hoyer lift)  ?  ?Recommendations for Other Services   ? ? ?  ?Precautions / Restrictions Precautions ?Precautions: Fall ?Restrictions ?Weight Bearing Restrictions: No  ?  ? ?Mobility ? Bed Mobility ?Overal bed mobility: Needs Assistance ?Bed Mobility: Supine to Sit ?  ?  ?Supine to sit: Supervision, HOB elevated ?  ?  ?General bed mobility comments: minimal difficulty performing semi-supine to sitting on own; SBA for lines ?  ? ?Transfers ?Overall transfer level: Needs assistance ?Equipment used: Rolling walker (2 wheels) ?Transfers: Sit to/from Stand ?Sit to Stand: From elevated surface, Max assist, Min guard ?  ?  ?  ?  ?  ?General transfer comment: max assist to stand from mildly elevated bed up to walker (unable 1st trial with 1 assist but able to stand 2nd trial with max assist x1); CGA to stand from recliner (elevated surface d/t pillows under pt to increase surface height of chair) ?  ? ?Ambulation/Gait ?Ambulation/Gait assistance: Min guard, Min assist ?Gait Distance (Feet): 3 Feet (bed to recliner) ?Assistive device: Rolling walker (2 wheels) (bariatric) ?  ?Gait velocity: decreased ?  ?  ?General Gait Details: increased effort to take steps; limited d/t SOB; assist to steady ? ? ?Stairs ?  ?  ?  ?  ?  ? ? ?Wheelchair Mobility ?  ? ?Modified Rankin (Stroke Patients Only) ?  ? ? ?  ?Balance Overall balance assessment: Needs assistance ?Sitting-balance support: No upper extremity supported, Feet supported ?Sitting balance-Leahy  Scale: Good ?Sitting balance - Comments: steady sitting reaching within BOS ?  ?  ?Standing balance-Leahy Scale: Fair ?Standing balance comment: steady static standing with at least single UE support ?  ?  ?  ?  ?  ?  ?  ?  ?  ?  ?  ?  ? ?  ?Cognition  Arousal/Alertness: Awake/alert ?Behavior During Therapy: [facility] for tasks assessed/performed ?Overall Cognitive Status: Within Functional Limits for tasks assessed ?  ?  ?  ?  ?  ?  ?  ?  ?  ?  ?  ?  ?  ?  ?  ?  ?  ?  ?  ? ?  ?Exercises General Exercises - Lower Extremity ?Ankle Circles/Pumps: AROM, Strengthening, Both, 10 reps, Seated ?Long Arc Quad: AROM, Strengthening, Both, 10 reps, Seated ?Hip Flexion/Marching: AROM, Strengthening, Both, 10 reps, Seated ? ?  ?General Comments  Nursing cleared pt for participation in physical therapy.  Pt agreeable to PT session. ?  ?  ? ?Pertinent Vitals/Pain Pain Assessment ?Pain Assessment: No/denies pain ?Pain Intervention(s): Limited activity within patient's tolerance, Monitored during session, Repositioned  ? ? ?Home Living   ?  ?  ?  ?  ?  ?  ?  ?  ?  ?   ?  ?Prior Function    ?  ?  ?   ? ?PT Goals (current goals can now be found in the care plan section) Acute Rehab PT Goals ?Patient Stated Goal: to go home ?PT Goal Formulation: With patient ?Time For Goal Achievement: 11/02/21 ?Potential to Achieve Goals: Fair ?Additional Goals ?Additional Goal #1: Pt will be able to perform bed mobility/transfers with supervision and safe technique in order to improve functional independence ?Progress towards PT goals: Progressing toward goals ? ?  ?Frequency ? ? ? Min 2X/week ? ? ? ?  ?PT Plan Current plan remains appropriate  ? ? ?Co-evaluation   ?  ?  ?  ?  ? ?  ?AM-PAC PT "6 Clicks" Mobility   ?Outcome Measure ? Help needed turning from your back to your side while in a flat bed without using bedrails?: A Little ?Help needed moving from lying on your back to sitting on the side of a flat bed without using bedrails?: A Little ?Help needed moving to and from a bed to a chair (including a wheelchair)?: A Little ?Help needed standing up from a chair using your arms (e.g., wheelchair or bedside chair)?: A Lot ?Help needed to walk in hospital room?: Total ?Help needed climbing 3-5 steps  with a railing? : Total ?6 Click Score: 13 ? ?  ?End of Session Equipment Utilized During Treatment: Gait belt;Oxygen ?Activity Tolerance: Patient limited by fatigue ?Patient left: in chair;with call bell/phone within reach;with chair alarm set;Other (comment) (B heels floating via pillow support) ?Nurse Communication: Mobility status;Precautions ?PT Visit Diagnosis: Difficulty in walking, not elsewhere classified (R26.2);Muscle weakness (generalized) (M62.81);Unsteadiness on feet (R26.81) ?  ? ? ?Time: 6629-4765 ?PT Time Calculation (min) (ACUTE ONLY): 39 min ? ?Charges:  $Therapeutic Exercise: 8-22 mins ?$Therapeutic Activity: 23-37 mins          ?          ?Leitha Bleak, PT ?10/27/21, 1:09 PM ? ? ?

## 2021-10-27 NOTE — Progress Notes (Signed)
? ? ?Progress Note ? ?Patient Name: Matthew [name] ?Date of Encounter: 10/27/2021 ? ?Primary Cardiologist: End ? ?Subjective  ? ?He underwent successful repeat DCCV, following IV amiodarone loading, on 10/26/2021.  This morning, he is maintaining sinus rhythm with heart rates in the mid 40s bpm (when sleeping and not wearing his CPAP) to 60s bpm. No chest pain, dyspnea, palpitations, dizziness, presyncope, or syncope.  ? ?Inpatient Medications  ?  ?Scheduled Meds: ? apixaban  5 mg Oral BID  ? docusate sodium  200 mg Oral BID  ? DULoxetine  60 mg Oral BID  ? feeding supplement (GLUCERNA SHAKE)  237 mL Oral BID BM  ? furosemide  20 mg Oral Daily  ? melatonin  5 mg Oral QHS  ? [START ON 10/28/2021] metoprolol succinate  25 mg Oral Daily  ? mirtazapine  15 mg Oral QHS  ? mometasone-formoterol  2 puff Inhalation BID  ? multivitamin with minerals  1 tablet Oral Daily  ? Ensure Max Protein  11 oz Oral QHS  ? tamsulosin  0.4 mg Oral QPC supper  ? ?Continuous Infusions: ? ?PRN Meds: ?acetaminophen, levalbuterol, ondansetron (ZOFRAN) IV, traMADol  ? ?Vital Signs  ?  ?Vitals:  ? 10/27/21 0410 10/27/21 0514 10/27/21 0818 10/27/21 1040  ?BP: (!) 96/53  121/73   ?Pulse: 61  (!) 59   ?Resp:   18   ?Temp: 98 ?F (36.7 ?C)  97.8 ?F (36.6 ?C)   ?TempSrc:   Oral   ?SpO2: 100%  100%   ?Weight:  130.3 kg  126.6 kg  ?Height:      ? ? ?Intake/Output Summary (Last 24 hours) at 10/27/2021 1151 ?Last data filed at 10/27/2021 1024 ?Gross per 24 hour  ?Intake 792.16 ml  ?Output 1125 ml  ?Net -332.84 ml  ? ?Filed Weights  ? 10/26/21 0813 10/27/21 0514 10/27/21 1040  ?Weight: 127 kg 130.3 kg 126.6 kg  ? ? ?Telemetry  ?  ?Sinus rhythm with sinus bradycardia with rates in the mid 40s to 60s bpm - Personally Reviewed ? ?ECG  ?  ?No new tracings - Personally Reviewed ? ?Physical Exam  ? ?GEN: No acute distress.   ?Neck: No JVD. ?Cardiac: RRR, no murmurs, rubs, or gallops.  ?Respiratory: Clear to auscultation bilaterally.  ?GI: Soft, nontender,  non-distended.   ?MS: No edema; No deformity. ?Neuro:  Alert and oriented x 3; Nonfocal.  ?Psych: Normal affect. ? ?Labs  ?  ?Chemistry ?Recent Labs  ?Lab 10/25/21 ?0569 10/26/21 ?7948 10/27/21 ?0503  ?NA 137 137 136  ?K 5.1 5.1 4.9  ?CL 101 101 101  ?CO2 '29 30 27  '$ ?GLUCOSE 106* 97 98  ?BUN 61* 67* 65*  ?CREATININE 2.56* 2.62* 2.63*  ?CALCIUM 9.4 9.8 9.4  ?GFRNONAA 26* 25* 25*  ?ANIONGAP '7 6 8  '$ ?  ? ?Hematology ?Recent Labs  ?Lab 10/24/21 ?0165 10/25/21 ?5374 10/26/21 ?0541  ?WBC 4.7 4.2 5.1  ?RBC 3.09* 3.20* 3.30*  ?HGB 8.3* 8.6* 8.9*  ?HCT 29.6* 30.6* 31.5*  ?MCV 95.8 95.6 95.5  ?MCH 26.9 26.9 27.0  ?MCHC 28.0* 28.1* 28.3*  ?RDW 14.8 14.7 14.7  ?PLT 294 284 287  ? ? ?Cardiac EnzymesNo results for input(s): TROPONINI in the last 168 hours. No results for input(s): TROPIPOC in the last 168 hours.  ? ?BNPNo results for input(s): BNP, PROBNP in the last 168 hours.  ? ?DDimer No results for input(s): DDIMER in the last 168 hours.  ? ?Radiology  ?  ?No results found. ? ?  Cardiac Studies  ? ?TEE 10/24/2021: ? 1. Left ventricular ejection fraction, by estimation, is 30 to 35%. The  ?left ventricle has moderate to severely decreased function. The left  ?ventricle demonstrates global hypokinesis.  ? 2. Right ventricular systolic function is low normal. The right  ?ventricular size is mildly enlarged.  ? 3. Left atrial size was moderately dilated. No left atrial/left atrial  ?appendage thrombus was detected.  ? 4. Right atrial size was mildly dilated.  ? 5. The mitral valve is normal in structure. Mild mitral valve  ?regurgitation.  ? 6. Tricuspid valve regurgitation is mild to moderate.  ? 7. The aortic valve is tricuspid. Aortic valve regurgitation is not  ?visualized.  ?  ?Echo 10/18/21: ? 1. Left ventricular ejection fraction, by estimation, is 35 to 40%. The  ?left ventricle has moderately decreased function. Left ventricular  ?endocardial border not optimally defined to evaluate regional wall motion.  ?The left ventricular  internal cavity  ?size was mildly to moderately dilated. There is moderate left ventricular  ?hypertrophy. Left ventricular diastolic function could not be evaluated.  ? 2. Right ventricular systolic function is normal. The right ventricular  ?size is mildly enlarged. Mildly increased right ventricular wall  ?thickness.  ? 3. Left atrial size was mildly dilated.  ? 4. Right atrial size was mildly dilated.  ? 5. The mitral valve was not well visualized. Mild mitral valve  ?regurgitation.  ? 6. The aortic valve is tricuspid. Aortic valve regurgitation is not  ?visualized. No aortic stenosis is present.  ? 7. The inferior vena cava is dilated in size with >50% respiratory  ?variability, suggesting right atrial pressure of 8 mmHg.  ?  ?[place] 10/17/2018: ?No CAD. ? ?Patient Profile  ?   ?71 y.o. male with history of chronic systolic and diastolic heart failure, persistent atrial fibrillation, asthma/COPD, CKD stage 3/4, anemia of chronic disease, and obesity admitted with COPD exacerbation requiring BiPAP and A-fib with RVR.  ? ?Assessment & Plan  ?  ?# Persistent atrial fibrillation:  ?He underwent TEE and cardioversion on 10/24/2021.  Cardioversion was not successful in converting him to sinus rhythm at that time with ventricular rates ranging from 100 to 120 bpm.  We were not able to increase the dose of Toprol given relatively low blood pressure.  He was on oral amiodarone that was started on 3/10, which was transitioned to IV amiodarone on 10/25/2021.  He has not missed any doses of [place].  He underwent repeat DCCV on 10/26/2021, that was successful.  He remains in sinus rhythm at this time with heart rates in the mid 40s bpm (when napping without his CPAP) to the 60s bpm.  With bradycardia, for now, decrease Toprol to 25 mg once daily from bid dosing, consider increasing back to bid dosing pending rate trend with recommended CPAP compliance.  Stop IV amiodarone.  Start oral amiodarone 400 mg bid x 1 week (3/23), followed by  200 mg bid x 1 week (3/30), followed by 200 mg daily thereafter (4/6).  Long term, with his pulmonary status, amiodarone may not be a great option and he may benefit from EP evaluation in the outpatient setting.  ? ?#Acute on chronic systolic and diastolic heart failure: ?LVEF this admission 35 to 40%.  Thought to be nonischemic.  He had a left heart cath in 2020 that revealed no coronary disease.  Continue metoprolol succinate.  Not able to add GDMT at the present time given low blood pressure, escalate GDMT as  able.  He appears to be euvolemic on small dose furosemide. ?  ?#COPD exacerbation: ?Improved.  Managed by IM. ?  ?#CKD 4: ?Creatinine stable.   ?  ?#Normocytic anemia: ?Prior GI bleed.  He has declined invasive work-up.  H/H is stable on Eliquis.  Consider [place] as an outpatient.  ? ?#OSA: ?Recommend CPAP usage for sleep/naps. Nonadherence with CPAP is contributing to bradycardia while sleeping.  ? ? ?   ? ?For questions or updates, please contact [place] ?Please consult [email or web address] for contact info under Cardiology/STEMI. ?  ? ?Signed, ?Christell Faith, PA-C ?[facility] ?Pager: [phone] ?10/27/2021, 11:51 AM ? ?

## 2021-10-27 NOTE — Progress Notes (Signed)
?[facility]  ?ROUNDING NOTE  ? ?Subjective:  ? ?Matthew [name] is a 71 year old male with past medical conditions including obesity, A-fib on Eliquis, diastolic heart failure, COPD with as needed O2 use, and chronic kidney disease stage IV.  Patient presents to the emergency department with complaints of shortness of breath.  Patient was admitted for Rapid atrial fibrillation ([place]) [I48.91] ?Atrial fibrillation with rapid ventricular response ([place]) [I48.91] ?Acute respiratory failure with hypoxemia ([place]) [J96.01] ?Patient is known to our practice from previous admissions.  Patient was referred to our office for outpatient follow-up and has not yet scheduled an appointment.   ? ?Daily update: ?Patient attempting to get out of bed to restroom ?Denies pain and discomfort ?Tolerating meals ?Remains on room air ? ?Creatinine 2.63 ?Urine output 1.3 L in preceding 24 hours ?Amiodarone drip remains ? ?Objective:  ?Vital signs in last 24 hours:  ?Temp:  [97.7 ?F (36.5 ?C)-98 ?F (36.7 ?C)] 97.8 ?F (36.6 ?C) (03/16 0818) ?Pulse Rate:  [50-124] 59 (03/16 0818) ?Resp:  [16-29] 18 (03/16 0818) ?BP: (96-135)/(41-85) 121/73 (03/16 0818) ?SpO2:  [93 %-100 %] 100 % (03/16 0818) ?Weight:  [130.3 kg] 130.3 kg (03/16 0514) ? ?Weight change: -2.393 kg ?Filed Weights  ? 10/26/21 0410 10/26/21 0813 10/27/21 0514  ?Weight: 127.5 kg 127 kg 130.3 kg  ? ? ?Intake/Output: ?I/O last 3 completed shifts: ?In: 869 [P.O.:480; I.V.:389] ?Out: 2000 [Urine:2000] ?  ?Intake/Output this shift: ? Total I/O ?In: 432.2 [I.V.:432.2] ?Out: 275 [Urine:275] ? ?Physical Exam: ?General: No acute distress  ?Head: Moist oral mucosal membranes  ?Eyes: Anicteric  ?Lungs:  Clear bilateral, normal effort  ?Heart: Regular, no rubs  ?Abdomen:  Soft, nontender, obese  ?Extremities: No peripheral edema.  ?Neurologic: Awake, alert,oriented  ?Skin: No acute lesions or rashes  ?   ? ? ?Basic Metabolic Panel: ?Recent Labs  ?Lab 10/23/21 ?1062 10/24/21 ?6948  10/25/21 ?5462 10/26/21 ?7035 10/27/21 ?0503  ?NA 137 138 137 137 136  ?K 4.7 4.9 5.1 5.1 4.9  ?CL 102 100 101 101 101  ?CO2 '30 28 29 30 27  '$ ?GLUCOSE 112* 95 106* 97 98  ?BUN 60* 58* 61* 67* 65*  ?CREATININE 3.20* 2.74* 2.56* 2.62* 2.63*  ?CALCIUM 9.1 9.6 9.4 9.8 9.4  ?MG  --   --  2.4 2.3 2.5*  ?PHOS  --   --   --   --  4.8*  ? ? ? ?Liver Function Tests: ?No results for input(s): AST, ALT, ALKPHOS, BILITOT, PROT, ALBUMIN in the last 168 hours. ? ?No results for input(s): LIPASE, AMYLASE in the last 168 hours. ?No results for input(s): AMMONIA in the last 168 hours. ? ?CBC: ?Recent Labs  ?Lab 10/22/21 ?0093 10/23/21 ?8182 10/24/21 ?9937 10/25/21 ?1696 10/26/21 ?0541  ?WBC 4.3 4.3 4.7 4.2 5.1  ?HGB 8.0* 8.1* 8.3* 8.6* 8.9*  ?HCT 28.0* 28.3* 29.6* 30.6* 31.5*  ?MCV 93.3 94.0 95.8 95.6 95.5  ?PLT 267 261 294 284 287  ? ? ? ?Cardiac Enzymes: ?No results for input(s): CKTOTAL, CKMB, CKMBINDEX, TROPONINI in the last 168 hours. ? ?BNP: ?Invalid input(s): POCBNP ? ?CBG: ?No results for input(s): GLUCAP in the last 168 hours. ? ? ?Microbiology: ?Results for orders placed or performed during the hospital encounter of 10/17/21  ?Resp Panel by RT-PCR (Flu A&B, Covid) Nasopharyngeal Swab     Status: None  ? Collection Time: 10/17/21  7:16 PM  ? Specimen: Nasopharyngeal Swab; Nasopharyngeal(NP) swabs in vial transport medium  ?Result Value Ref Range Status  ?  SARS Coronavirus 2 by RT PCR NEGATIVE NEGATIVE Final  ?  Comment: (NOTE) ?SARS-CoV-2 target nucleic acids are NOT DETECTED. ? ?The SARS-CoV-2 RNA is generally detectable in upper respiratory ?specimens during the acute phase of infection. The lowest ?concentration of SARS-CoV-2 viral copies this assay can detect is ?138 copies/mL. A negative result does not preclude SARS-Cov-2 ?infection and should not be used as the sole basis for treatment or ?other patient management decisions. A negative result may occur with  ?improper specimen collection/handling, submission of  specimen other ?than nasopharyngeal swab, presence of viral mutation(s) within the ?areas targeted by this assay, and inadequate number of viral ?copies(<138 copies/mL). A negative result must be combined with ?clinical observations, patient history, and epidemiological ?information. The expected result is Negative. ? ?Fact Sheet for Patients:  ?EntrepreneurPulse.com.au ? ?Fact Sheet for Healthcare Providers:  ?IncredibleEmployment.be ? ?This test is no t yet approved or cleared by the [place] FDA and  ?has been authorized for detection and/or diagnosis of SARS-CoV-2 by ?FDA under an Emergency Use Authorization (EUA). This EUA will remain  ?in effect (meaning this test can be used) for the duration of the ?COVID-19 declaration under Section 564(b)(1) of the Act, 21 ?U.S.C.section 360bbb-3(b)(1), unless the authorization is terminated  ?or revoked sooner.  ? ? ?  ? Influenza A by PCR NEGATIVE NEGATIVE Final  ? Influenza B by PCR NEGATIVE NEGATIVE Final  ?  Comment: (NOTE) ?The Xpert Xpress SARS-CoV-2/FLU/RSV plus assay is intended as an aid ?in the diagnosis of influenza from Nasopharyngeal swab specimens and ?should not be used as a sole basis for treatment. Nasal washings and ?aspirates are unacceptable for Xpert Xpress SARS-CoV-2/FLU/RSV ?testing. ? ?Fact Sheet for Patients: ?EntrepreneurPulse.com.au ? ?Fact Sheet for Healthcare Providers: ?IncredibleEmployment.be ? ?This test is not yet approved or cleared by the [place] FDA and ?has been authorized for detection and/or diagnosis of SARS-CoV-2 by ?FDA under an Emergency Use Authorization (EUA). This EUA will remain ?in effect (meaning this test can be used) for the duration of the ?COVID-19 declaration under Section 564(b)(1) of the Act, 21 U.S.C. ?section 360bbb-3(b)(1), unless the authorization is terminated or ?revoked. ? ?Performed at [facility], [place], ?[place] [address] ?  ?Blood culture (routine x 2)     Status: Abnormal  ? Collection Time: 10/17/21  7:16 PM  ? Specimen: BLOOD  ?Result Value Ref Range Status  ? Specimen Description   Final  ?  BLOOD LEFT WRIST ?Performed at [facility], [address] [place], [place] [address] ?  ? Special Requests   Final  ?  BOTTLES DRAWN AEROBIC AND ANAEROBIC Blood Culture adequate volume ?Performed at [facility], [address], [place], [place] [address] ?  ? Culture  Setup Time   Final  ?  GRAM POSITIVE COCCI ?IN BOTH AEROBIC AND ANAEROBIC BOTTLES ?CRITICAL RESULT CALLED TO, READ BACK BY AND VERIFIED WITHLonie Peak PHARMD 1422 10/18/21 HNM ?  ? Culture (A)  Final  ?  STAPHYLOCOCCUS EPIDERMIDIS ?THE SIGNIFICANCE OF ISOLATING THIS ORGANISM FROM A SINGLE SET OF BLOOD CULTURES WHEN MULTIPLE SETS ARE DRAWN IS UNCERTAIN. PLEASE NOTIFY THE MICROBIOLOGY DEPARTMENT WITHIN ONE WEEK IF SPECIATION AND SENSITIVITIES ARE REQUIRED. ?Performed at [facility], [place] [address] [place], [place] [address] ?  ? Report Status 10/21/2021 FINAL  Final  ?Blood culture (routine x 2)     Status: None  ? Collection Time: 10/17/21  7:16 PM  ? Specimen: BLOOD  ?  Result Value Ref Range Status  ? Specimen Description BLOOD RIGHT ARM  Final  ? Special Requests   Final  ?  BOTTLES DRAWN AEROBIC AND ANAEROBIC Blood Culture results may not be optimal due to an inadequate volume of blood received in culture bottles  ? Culture   Final  ?  NO GROWTH 5 DAYS ?Performed at [facility], [address], [place], [place] [address] ?  ? Report Status 10/22/2021 FINAL  Final  ?Blood Culture ID Panel (Reflexed)     Status: Abnormal  ? Collection Time: 10/17/21  7:16 PM  ?Result Value Ref Range Status  ? Enterococcus faecalis NOT DETECTED NOT DETECTED Final  ? Enterococcus Faecium NOT DETECTED NOT DETECTED Final  ? Listeria monocytogenes NOT DETECTED NOT DETECTED Final  ? Staphylococcus species DETECTED (A) NOT  DETECTED Final  ?  Comment: CRITICAL RESULT CALLED TO, READ BACK BY AND VERIFIED WITH: Lonie Peak PHARMD 1422 10/18/21 HNM ?  ? Staphylococcus aureus (BCID) NOT DETECTED NOT DETECTED Final  ? Staphylococcus epidermidis D

## 2021-10-28 DIAGNOSIS — I4819 Other persistent atrial fibrillation: Secondary | ICD-10-CM | POA: Diagnosis not present

## 2021-10-28 DIAGNOSIS — I4891 Unspecified atrial fibrillation: Secondary | ICD-10-CM | POA: Diagnosis not present

## 2021-10-28 LAB — GLUCOSE, CAPILLARY: Glucose-Capillary: 132 mg/dL — ABNORMAL HIGH (ref 70–99)

## 2021-10-28 MED ORDER — ALUM & MAG HYDROXIDE-SIMETH 200-200-20 MG/5ML PO SUSP
30.0000 mL | Freq: Four times a day (QID) | ORAL | Status: DC | PRN
  Administered 2021-10-28 – 2021-10-29 (×2): 30 mL via ORAL
  Filled 2021-10-28 (×2): qty 30

## 2021-10-28 MED ORDER — METOPROLOL SUCCINATE ER 25 MG PO TB24
12.5000 mg | ORAL_TABLET | Freq: Every day | ORAL | Status: DC
  Administered 2021-10-29: 12.5 mg via ORAL
  Filled 2021-10-28: qty 1

## 2021-10-28 NOTE — Progress Notes (Signed)
Mobility Specialist - Progress Note ? ? 10/28/21 1400  ?Mobility  ?Activity Transferred to/from [name]  ?Level of Assistance +2 (takes two people)  ?Assistive Device BSC  ?Distance Ambulated (ft) 0 ft  ?Activity Response Tolerated well  ?$Mobility charge 1 Mobility  ? ? ? ?+2 Assist with RN for B-BSC transfer. +2 Assist for BSC-B transfer with PT Raquel Sarna. ? ?Matthew [name] ?Mobility Specialist ?10/28/21, 2:51 PM ? ? ? ? ?

## 2021-10-28 NOTE — Progress Notes (Signed)
? ? ?Progress Note ? ?Patient Name: Matthew [name] ?Date of Encounter: 10/28/2021 ? ?Primary Cardiologist: End ? ?Subjective  ? ?He underwent successful repeat DCCV, following IV amiodarone loading, on 10/26/2021.  His Toprol was decreased from 25 mg bid to 25 mg daily yesterday with noted soft BP and bradycardia. He has been transitioned to oral amiodarone. No chest pain, dyspnea, palpitations, dizziness, presyncope, or syncope.  ? ?Inpatient Medications  ?  ?Scheduled Meds: ? amiodarone  400 mg Oral BID  ? apixaban  5 mg Oral BID  ? docusate sodium  200 mg Oral BID  ? DULoxetine  60 mg Oral BID  ? feeding supplement (GLUCERNA SHAKE)  237 mL Oral BID BM  ? furosemide  20 mg Oral Daily  ? melatonin  5 mg Oral QHS  ? [START ON 10/29/2021] metoprolol succinate  12.5 mg Oral Daily  ? mirtazapine  15 mg Oral QHS  ? mometasone-formoterol  2 puff Inhalation BID  ? multivitamin with minerals  1 tablet Oral Daily  ? Ensure Max Protein  11 oz Oral QHS  ? tamsulosin  0.4 mg Oral QPC supper  ? ?Continuous Infusions: ? ?PRN Meds: ?acetaminophen, levalbuterol, ondansetron (ZOFRAN) IV, traMADol  ? ?Vital Signs  ?  ?Vitals:  ? 10/27/21 2335 10/28/21 0400 10/28/21 0448 10/28/21 0804  ?BP: 113/71  112/68 124/71  ?Pulse: 62  62 64  ?Resp: '20  20 18  '$ ?Temp: 98.3 ?F (36.8 ?C)  98.1 ?F (36.7 ?C) 97.7 ?F (36.5 ?C)  ?TempSrc:      ?SpO2: 100%  100% 100%  ?Weight:  125.4 kg    ?Height:      ? ? ?Intake/Output Summary (Last 24 hours) at 10/28/2021 0913 ?Last data filed at 10/28/2021 0400 ?Gross per 24 hour  ?Intake 1052.24 ml  ?Output 880 ml  ?Net 172.24 ml  ? ? ?Filed Weights  ? 10/27/21 0514 10/27/21 1040 10/28/21 0400  ?Weight: 130.3 kg 126.6 kg 125.4 kg  ? ? ?Telemetry  ?  ?Sinus rhythm with sinus bradycardia with rates in the mid 40s to 60s bpm - Personally Reviewed ? ?ECG  ?  ?No new tracings - Personally Reviewed ? ?Physical Exam  ? ?GEN: No acute distress.   ?Neck: No JVD. ?Cardiac: RRR, no murmurs, rubs, or gallops.  ?Respiratory:  Clear to auscultation bilaterally.  ?GI: Soft, nontender, non-distended.   ?MS: No edema; No deformity. ?Neuro:  Alert and oriented x 3; Nonfocal.  ?Psych: Normal affect. ? ?Labs  ?  ?Chemistry ?Recent Labs  ?Lab 10/25/21 ?4917 10/26/21 ?9150 10/27/21 ?0503  ?NA 137 137 136  ?K 5.1 5.1 4.9  ?CL 101 101 101  ?CO2 '29 30 27  '$ ?GLUCOSE 106* 97 98  ?BUN 61* 67* 65*  ?CREATININE 2.56* 2.62* 2.63*  ?CALCIUM 9.4 9.8 9.4  ?GFRNONAA 26* 25* 25*  ?ANIONGAP '7 6 8  '$ ? ?  ? ?Hematology ?Recent Labs  ?Lab 10/24/21 ?5697 10/25/21 ?9480 10/26/21 ?0541  ?WBC 4.7 4.2 5.1  ?RBC 3.09* 3.20* 3.30*  ?HGB 8.3* 8.6* 8.9*  ?HCT 29.6* 30.6* 31.5*  ?MCV 95.8 95.6 95.5  ?MCH 26.9 26.9 27.0  ?MCHC 28.0* 28.1* 28.3*  ?RDW 14.8 14.7 14.7  ?PLT 294 284 287  ? ? ? ?Cardiac EnzymesNo results for input(s): TROPONINI in the last 168 hours. No results for input(s): TROPIPOC in the last 168 hours.  ? ?BNPNo results for input(s): BNP, PROBNP in the last 168 hours.  ? ?DDimer No results for input(s): DDIMER in the  last 168 hours.  ? ?Radiology  ?  ?No results found. ? ?Cardiac Studies  ? ?TEE 10/24/2021: ? 1. Left ventricular ejection fraction, by estimation, is 30 to 35%. The  ?left ventricle has moderate to severely decreased function. The left  ?ventricle demonstrates global hypokinesis.  ? 2. Right ventricular systolic function is low normal. The right  ?ventricular size is mildly enlarged.  ? 3. Left atrial size was moderately dilated. No left atrial/left atrial  ?appendage thrombus was detected.  ? 4. Right atrial size was mildly dilated.  ? 5. The mitral valve is normal in structure. Mild mitral valve  ?regurgitation.  ? 6. Tricuspid valve regurgitation is mild to moderate.  ? 7. The aortic valve is tricuspid. Aortic valve regurgitation is not  ?visualized.  ?  ?Echo 10/18/21: ? 1. Left ventricular ejection fraction, by estimation, is 35 to 40%. The  ?left ventricle has moderately decreased function. Left ventricular  ?endocardial border not optimally  defined to evaluate regional wall motion.  ?The left ventricular internal cavity  ?size was mildly to moderately dilated. There is moderate left ventricular  ?hypertrophy. Left ventricular diastolic function could not be evaluated.  ? 2. Right ventricular systolic function is normal. The right ventricular  ?size is mildly enlarged. Mildly increased right ventricular wall  ?thickness.  ? 3. Left atrial size was mildly dilated.  ? 4. Right atrial size was mildly dilated.  ? 5. The mitral valve was not well visualized. Mild mitral valve  ?regurgitation.  ? 6. The aortic valve is tricuspid. Aortic valve regurgitation is not  ?visualized. No aortic stenosis is present.  ? 7. The inferior vena cava is dilated in size with >50% respiratory  ?variability, suggesting right atrial pressure of 8 mmHg.  ?  ?[place] 10/17/2018: ?No CAD. ? ?Patient Profile  ?   ?71 y.o. male with history of chronic systolic and diastolic heart failure, persistent atrial fibrillation, asthma/COPD, CKD stage 3/4, anemia of chronic disease, and obesity admitted with COPD exacerbation requiring BiPAP and A-fib with RVR.  ? ?Assessment & Plan  ?  ?# Persistent atrial fibrillation:  ?He underwent TEE and cardioversion on 10/24/2021.  Cardioversion was not successful in converting him to sinus rhythm at that time with ventricular rates ranging from 100 to 120 bpm.  We were not able to increase the dose of Toprol given relatively low blood pressure.  He was on oral amiodarone that was started on 3/10, which was transitioned to IV amiodarone on 10/25/2021.  He has not missed any doses of [place].  He underwent repeat DCCV on 10/26/2021, that was successful.  He remains in sinus rhythm at this time with heart rates in the mid 40s bpm (when napping without his CPAP) to the 60s bpm.  With episodes of bradycardia, decrease Toprol to 12.5 mg daily.  Continue oral amiodarone 400 mg bid x 1 week (3/23), followed by 200 mg bid x 1 week (3/30), followed by 200 mg daily  thereafter (4/6).  Long term, with his pulmonary status, amiodarone may not be a great option and he may benefit from EP evaluation in the outpatient setting.  Ambulate in the hallway.  ? ?#Acute on chronic systolic and diastolic heart failure: ?LVEF this admission 35 to 40%.  Thought to be nonischemic.  He had a left heart cath in 2020 that revealed no coronary disease.  Continue metoprolol succinate.  Not able to add GDMT at the present time given low blood pressure, escalate GDMT as able.  He  appears to be euvolemic on small dose furosemide. ?  ?#COPD exacerbation: ?Improved.  Wean supplemental oxygen as tolerated.  Managed by IM. ?  ?#CKD 4: ?Creatinine stable.   ?  ?#Normocytic anemia: ?Prior GI bleed.  He has declined invasive work-up.  H/H is stable on Eliquis.  Consider [place] as an outpatient.  ? ?#OSA: ?Recommend CPAP usage for sleep/naps. Nonadherence with CPAP is contributing to bradycardia while sleeping.  ? ? ?From a cardiac perspective, if he tolerates ambulation in the hallway today, he can be discharged on current cardiac medications.  ? ? ?   ? ?For questions or updates, please contact [place] ?Please consult [email or web address] for contact info under Cardiology/STEMI. ?  ? ?Signed, ?Christell Faith, PA-C ?[facility] ?Pager: [phone] ?10/28/2021, 9:13 AM ? ?

## 2021-10-28 NOTE — Progress Notes (Signed)
?PROGRESS NOTE ? ? ? ?Matthew [name]  ZOX:[id number] DOB: [birth year] DOA: 10/17/2021 ? ?PCP: [name], MD  ? ?Brief Narrative:  ?This 71 years old male with PMH significant for COPD, OSA not on CPAP, class IV obesity, CKD stage IV, hypertension, A-fib on Eliquis, diastolic CHF with last exacerbation in October 2022 presents in the ED with worsening shortness of breath.  Patient was placed on BiPAP.  Patient was severely hypoxic requiring BiPAP on arrival.  EKG shows A-fib with RVR.  Patient was started on amiodarone infusion.  Patient was also started on doxycycline for possible bronchitis. ?Patient was admitted for A. Fib with RVR.  Cardiology consulted. Patient is status post cardioversion 10/24/21  but it was unsuccessful.  Patient continued to remain in A-fib.  Patient was started on amiodarone infusion and underwent repeat cardioversion 10/26/2021.  Patient now remains in normal sinus rhythm.  Amiodarone infusion discontinued. ? ?Assessment & Plan: ?  ?Principal Problem: ?  Rapid atrial fibrillation ([place]) ?Active Problems: ?  Acute on chronic respiratory failure with hypoxia (HCC) ?  Acute on chronic diastolic CHF (congestive heart failure) ([place]) ?  Hypertension ?  COPD (chronic obstructive pulmonary disease) ([place]) ?  CKD (chronic kidney disease) stage 4, GFR 15-29 ml/min (HCC) ?  Obesity, Class III, BMI 40-49.9 (morbid obesity) ([place]) ?  OSA (obstructive sleep apnea) ?  NICM (nonischemic cardiomyopathy) ([place]) ? ?Paroxysmal A-fib with RVR: ?Patient has presented with severe shortness of breath, EKG shows A-fib with RVR. ?Likely due to medication noncompliance.  Patient was started on amiodarone infusion given low blood pressure.   ?Patient was not given Cardizem.  Resumed on metoprolol and Eliquis. ?S/p cardioversion on 10/24/21  which was unsuccessful as EKG still shows persistent A-fib. ?He underwent successful cardioversion to NSR on 10/26/2021. ?He remains in normal sinus rhythm but heart rate remains between  40-60. ?Cardiology recommended to discontinue amiodarone infusion. ?Amiodarone infusion discontinued and started on amiodarone 400 mg twice daily. ? ?Acute on chronic systolic CHF: ?Acute hypoxic respiratory failure. ?He presented with severe shortness of breath requiring BiPAP on arrival. ?SPO2 88% on room air on arrival requiring BiPAP. ?Weaned off of the BiPAP now, continue supplemental oxygen, remains on 2L/m ?Continue IV Lasix 20 mg daily. ?Daily weight, continue intake output charting. ?Wean oxygen as tolerated. ? ? ?Intake/Output Summary (Last 24 hours) at 10/28/2021 1442 ?Last data filed at 10/28/2021 1100 ?Gross per 24 hour  ?Intake 500.08 ml  ?Output 1430 ml  ?Net -929.92 ml  ?  ?Essential hypertension: ?Continue metoprolol. ? ?COPD: ?No signs of exacerbation. ?Continue bronchodilators and encourage incentive spirometry. ? ?AKI on CKD stage IV: ?Baseline serum creatinine 2.35. ?Serum creatinine trending down.  3.14>2.97>2.52>2.62 ?Avoid nephrotoxic medications.  Good urine output. ? ?OSA: ?Not using CPAP at home. ? ?BPH: ?Continue Flomax ? ?Depression: ?Continue duloxetine, Remeron ? ?Gout:  ?Patient had stopped allopurinol on self. ? ?Obesity: ?Diet and exercise discussed in detail. ? ? ?DVT prophylaxis: Eliquis ?Code Status: Full code ?Family Communication: No family at bedside ?Disposition Plan:  ? ?Status is: Inpatient ?Remains inpatient appropriate because:  ?Admitted for A-fib with RVR.  He underwent cardioversion on 3/13 which was unsuccessful.  He underwent successful cardioversion to NSR on 10/26/2021.  He now remains in normal sinus rhythm.  Cardiology is following. ? ?Anticipated discharge home with home health services tomorrow. ? ? ?Consultants:  ?Cardiology ?Nephrology ? ?Procedures:  Cardioversion x 2 ?Antimicrobials:  ?Anti-infectives (From admission, onward)  ? ? Start     Dose/Rate  Route Frequency Ordered Stop  ? 10/18/21 1000  doxycycline (VIBRA-TABS) tablet 100 mg  Status:  Discontinued        ? 100 mg Oral Every 12 hours 10/17/21 2327 10/18/21 0859  ? 10/17/21 2100  doxycycline (VIBRAMYCIN) 100 mg in sodium chloride 0.9 % 250 mL IVPB       ? 100 mg ?125 mL/hr over 120 Minutes Intravenous  Once 10/17/21 2031 10/18/21 0259  ? 10/17/21 2045  cefTRIAXone (ROCEPHIN) 2 g in sodium chloride 0.9 % 100 mL IVPB       ? 2 g ?200 mL/hr over 30 Minutes Intravenous  Once 10/17/21 2031 10/17/21 2155  ? ?  ?  ? ? ?Subjective: ?Patient was seen and examined at bedside.  Overnight events noted. ?Patient reports feeling better, remains in normal sinus rhythm. Denies any chest pain and palpitation ?He continues to remain on 2 L of supplemental oxygen. ? ?Objective: ?Vitals:  ? 10/28/21 0400 10/28/21 0448 10/28/21 0804 10/28/21 1104  ?BP:  112/68 124/71 (!) 119/52  ?Pulse:  62 64 (!) 50  ?Resp:  '20 18 17  '$ ?Temp:  98.1 ?F (36.7 ?C) 97.7 ?F (36.5 ?C) 97.7 ?F (36.5 ?C)  ?TempSrc:      ?SpO2:  100% 100% 100%  ?Weight: 125.4 kg     ?Height:      ? ? ?Intake/Output Summary (Last 24 hours) at 10/28/2021 1442 ?Last data filed at 10/28/2021 1100 ?Gross per 24 hour  ?Intake 500.08 ml  ?Output 1430 ml  ?Net -929.92 ml  ? ?Filed Weights  ? 10/27/21 0514 10/27/21 1040 10/28/21 0400  ?Weight: 130.3 kg 126.6 kg 125.4 kg  ? ? ?Examination: ? ?General exam: Appears comfortable, not in any acute distress.  Deconditioned ?Respiratory system: CTA bilaterally, respiratory effort normal, no accessory muscle use. ?Cardiovascular system: S1-S2 heard, regular rate and rhythm, no murmur. ?Gastrointestinal system: Abdomen is non distended, soft and non tender.Normal bowel sounds heard. ?Central nervous system: Alert and oriented x 3. No focal neurological deficits. ?Extremities: No edema, no cyanosis, no clubbing. ?Skin: No rashes, lesions or ulcers ?Psychiatry: Judgement and insight appear normal. Mood & affect appropriate.  ? ? ? ?Data Reviewed: I have personally reviewed following labs and imaging studies ? ?CBC: ?Recent Labs  ?Lab 10/22/21 ?3545  10/23/21 ?6256 10/24/21 ?3893 10/25/21 ?7342 10/26/21 ?0541  ?WBC 4.3 4.3 4.7 4.2 5.1  ?HGB 8.0* 8.1* 8.3* 8.6* 8.9*  ?HCT 28.0* 28.3* 29.6* 30.6* 31.5*  ?MCV 93.3 94.0 95.8 95.6 95.5  ?PLT 267 261 294 284 287  ? ?Basic Metabolic Panel: ?Recent Labs  ?Lab 10/23/21 ?8768 10/24/21 ?1157 10/25/21 ?2620 10/26/21 ?3559 10/27/21 ?0503  ?NA 137 138 137 137 136  ?K 4.7 4.9 5.1 5.1 4.9  ?CL 102 100 101 101 101  ?CO2 '30 28 29 30 27  '$ ?GLUCOSE 112* 95 106* 97 98  ?BUN 60* 58* 61* 67* 65*  ?CREATININE 3.20* 2.74* 2.56* 2.62* 2.63*  ?CALCIUM 9.1 9.6 9.4 9.8 9.4  ?MG  --   --  2.4 2.3 2.5*  ?PHOS  --   --   --   --  4.8*  ? ?GFR: ?Estimated Creatinine Clearance: 36.3 mL/min (A) (by C-G formula based on SCr of 2.63 mg/dL (H)). ?Liver Function Tests: ?No results for input(s): AST, ALT, ALKPHOS, BILITOT, PROT, ALBUMIN in the last 168 hours. ?No results for input(s): LIPASE, AMYLASE in the last 168 hours. ?No results for input(s): AMMONIA in the last 168 hours. ?Coagulation Profile: ?No results for input(s):  INR, PROTIME in the last 168 hours. ?Cardiac Enzymes: ?No results for input(s): CKTOTAL, CKMB, CKMBINDEX, TROPONINI in the last 168 hours. ?BNP (last 3 results) ?No results for input(s): PROBNP in the last 8760 hours. ?HbA1C: ?No results for input(s): HGBA1C in the last 72 hours. ?CBG: ?Recent Labs  ?Lab 10/28/21 ?1135  ?GLUCAP 132*  ? ? ?Lipid Profile: ?No results for input(s): CHOL, HDL, LDLCALC, TRIG, CHOLHDL, LDLDIRECT in the last 72 hours. ?Thyroid Function Tests: ?No results for input(s): TSH, T4TOTAL, FREET4, T3FREE, THYROIDAB in the last 72 hours. ?Anemia Panel: ?No results for input(s): VITAMINB12, FOLATE, FERRITIN, TIBC, IRON, RETICCTPCT in the last 72 hours. ?Sepsis Labs: ?No results for input(s): PROCALCITON, LATICACIDVEN in the last 168 hours. ? ?Recent Results (from the past 240 hour(s))  ?CULTURE, BLOOD (ROUTINE X 2) w Reflex to ID Panel     Status: None  ? Collection Time: 10/18/21  7:05 PM  ? Specimen: BLOOD   ?Result Value Ref Range Status  ? Specimen Description BLOOD BL[facility]  Final  ? Special Requests BOTTLES DRAWN AEROBIC AND ANAEROBIC BCAV  Final  ? Culture   Final  ?  NO GROWTH 5 DAYS ?Performed at [facility]

## 2021-10-28 NOTE — Progress Notes (Signed)
Physical Therapy Treatment ?Patient Details ?Name: Matthew [name] ?MRN: [id number] ?DOB: 02-Mar-1951 ?Today's Date: 10/28/2021 ? ? ?History of Present Illness Per MD Notes: Matthew [name] is a 71 y.o. male with medical history significant for COPD, OSA not on CPAP, prior home O2 use, class IV obesity, CKD stage IV, HTN, A-fib on Eliquis, diastolic CHF with last exacerbation in October 2022, who presents to the ED by EMS with respiratory distress, chest pain, arriving on CPAP. ? ?  ?PT Comments  ? ? Pt sitting on BSC upon PT arrival with pt's nurse and mobility specialist present.  Pt reporting feeling too weak to stand initially (needing a little more time sitting before trying to stand) and declined any attempt at ambulation d/t weakness.  Pt requiring min assist x2 to perform squat pivot BSC to bed with use of bed rail.  Pt appearing shaky, weak, and SOB so pt assisted back to bed to rest with HOB elevated.  Pt reporting feeling like it was hard to breath (O2 sats 92% on 2 L via nasal cannula) and also c/o 8/10 chest pain (although pt reporting feeling like he had too much to eat for lunch)--nurse notified and came to assess pt and nurse present with pt end of therapy session.  Continue to recommend SNF (pt agreeable to SNF but reports his wife requesting home discharge); TOC updated on pt's status and discharge concerns.  Will continue to focus on strengthening, balance, endurance, and progressive functional mobility during hospitalization. ?  ?Recommendations for follow up therapy are one component of a multi-disciplinary discharge planning process, led by the attending physician.  Recommendations may be updated based on patient status, additional functional criteria and insurance authorization. ? ?Follow Up Recommendations ? Skilled nursing-short term rehab (<3 hours/day) ?  ?  ?Assistance Recommended at Discharge Frequent or constant Supervision/Assistance  ?Patient can return home with the following Two people to  help with walking and/or transfers;Assistance with cooking/housework;Help with stairs or ramp for entrance;Two people to help with bathing/dressing/bathroom;Assist for transportation ?  ?Equipment Recommendations ? BSC/3in1;Rolling walker (2 wheels);Wheelchair (measurements PT);Wheelchair cushion (measurements PT);Hospital bed (hoyer lift)  ?  ?Recommendations for Other Services   ? ? ?  ?Precautions / Restrictions Precautions ?Precautions: Fall ?Restrictions ?Weight Bearing Restrictions: No  ?  ? ?Mobility ? Bed Mobility ?Overal bed mobility: Needs Assistance ?Bed Mobility: Sit to Supine ?  ?  ?  ?Sit to supine: Min assist, +2 for physical assistance, HOB elevated ?  ?General bed mobility comments: assist for trunk and B LE's ?  ? ?Transfers ?Overall transfer level: Needs assistance ?Equipment used:  (held onto bed rail with R hand) ?Transfers: Bed to chair/wheelchair/BSC ?  ?  ?  ?Squat pivot transfers: Min assist, +2 physical assistance ?  ?  ?General transfer comment: squat pivot BSC to bed with use of bed rail ?  ? ?Ambulation/Gait ?  ?  ?  ?  ?  ?  ?  ?General Gait Details: pt declined d/t weakness, fatigue, and SOB with activity ? ? ?Stairs ?  ?  ?  ?  ?  ? ? ?Wheelchair Mobility ?  ? ?Modified Rankin (Stroke Patients Only) ?  ? ? ?  ?Balance Overall balance assessment: Needs assistance ?Sitting-balance support: No upper extremity supported, Feet supported ?Sitting balance-Leahy Scale: Good ?Sitting balance - Comments: steady sitting reaching within BOS ?  ?  ?  ?  ?  ?  ?  ?  ?  ?  ?  ?  ?  ?  ?  ?  ? ?  ?  Cognition Arousal/Alertness: Awake/alert ?Behavior During Therapy: [facility] for tasks assessed/performed ?Overall Cognitive Status: Within Functional Limits for tasks assessed ?  ?  ?  ?  ?  ?  ?  ?  ?  ?  ?  ?  ?  ?  ?  ?  ?  ?  ?  ? ?  ?Exercises   ? ?  ?General Comments  Nursing cleared pt for participation in physical therapy.  Pt agreeable to PT session. ?  ?  ? ?Pertinent Vitals/Pain Pain Assessment ?Pain  Assessment: Faces ?Faces Pain Scale: Hurts a little bit ?Pain Location: L knee soreness ?Pain Descriptors / Indicators: Aching, Sore ?Pain Intervention(s): Limited activity within patient's tolerance, Monitored during session, RepositionedHR HR 56-77 bpm and O2 sats 92% or greater on 2 L via nasal cannula during sessions activities.  ? ? ?Home Living   ?  ?  ?  ?  ?  ?  ?  ?  ?  ?   ?  ?Prior Function    ?  ?  ?   ? ?PT Goals (current goals can now be found in the care plan section) Acute Rehab PT Goals ?Patient Stated Goal: to go home ?PT Goal Formulation: With patient ?Time For Goal Achievement: 11/02/21 ?Potential to Achieve Goals: Fair ?Additional Goals ?Additional Goal #1: Pt will be able to perform bed mobility/transfers with supervision and safe technique in order to improve functional independence ?Progress towards PT goals: Progressing toward goals ? ?  ?Frequency ? ? ? Min 2X/week ? ? ? ?  ?PT Plan Current plan remains appropriate  ? ? ?Co-evaluation   ?  ?  ?  ?  ? ?  ?AM-PAC PT "6 Clicks" Mobility   ?Outcome Measure ? Help needed turning from your back to your side while in a flat bed without using bedrails?: A Little ?Help needed moving from lying on your back to sitting on the side of a flat bed without using bedrails?: A Little ?Help needed moving to and from a bed to a chair (including a wheelchair)?: A Lot ?Help needed standing up from a chair using your arms (e.g., wheelchair or bedside chair)?: A Lot ?Help needed to walk in hospital room?: Total ?Help needed climbing 3-5 steps with a railing? : Total ?6 Click Score: 12 ? ?  ?End of Session Equipment Utilized During Treatment: Gait belt;Oxygen ?Activity Tolerance: Patient limited by fatigue ?Patient left: in bed;with call bell/phone within reach;with bed alarm set;with nursing/sitter in room ?Nurse Communication: Mobility status;Precautions;Other (comment) (pt's SOB and c/o chest pain) ?PT Visit Diagnosis: Difficulty in walking, not elsewhere  classified (R26.2);Muscle weakness (generalized) (M62.81);Unsteadiness on feet (R26.81) ?  ? ? ?Time: 1749-4496 ?PT Time Calculation (min) (ACUTE ONLY): 27 min ? ?Charges:  $Therapeutic Activity: 23-37 mins          ?          ?Leitha Bleak, PT ?10/28/21, 4:05 PM ? ? ?

## 2021-10-28 NOTE — Progress Notes (Signed)
OT Cancellation Note ? ?Patient Details ?Name: Matthew [name] ?MRN: [id number] ?DOB: Dec 04, 1950 ? ? ?Cancelled Treatment:    Reason Eval/Treat Not Completed: Patient declined, no reason specified. Pt declined, reports having been up recently and just eager to return home. Will re-attempt as appropriate.   ? ?Ardeth Perfect., MPH, MS, OTR/L ?ascom [phone] ?10/28/21, 3:33 PM ? ?

## 2021-10-28 NOTE — TOC Progression Note (Addendum)
Transition of Care (TOC) - Progression Note  ? ? ?Patient Details  ?Name: Matthew [name] ?MRN: [id number] ?Date of Birth: [birth year] ? ?Transition of Care (TOC) CM/SW Contact  ?Alberteen Sam, LCSW ?Phone Number: ?10/28/2021, 9:14 AM ? ?Clinical Narrative:    ? ?TOC continues to follow for medical readiness to discharge.  ? ?[employer] has delivered DME 3in1 and Hospital Bed to patient's home.  ?  ?Patient referral given to cory with [facility] for [facility] PT RN and aide they accepted.  ?  ?TOC continuing to follow for needs.  ? ?Expected Discharge Plan: Home/Self Care ?Barriers to Discharge: Continued Medical Work up ? ?Expected Discharge Plan and Services ?Expected Discharge Plan: Home/Self Care ?  ?Discharge Planning Services: CM Consult ?  ?Living arrangements for the past 2 months: [place] ?                ?DME Arranged: N/A ?DME Agency: NA ?  ?  ?  ?  ?  ?  ?  ?  ? ? ?Social Determinants of Health (SDOH) Interventions ?  ? ?Readmission Risk Interventions ?Readmission Risk Prevention Plan 10/18/2021  ?Transportation Screening Complete  ?Medication Review Press photographer) Complete  ?PCP or Specialist appointment within 3-5 days of discharge Complete  ?[place] or Home Care Consult Complete  ?SW Recovery Care/Counseling Consult Complete  ?Palliative Care Screening Not Applicable  ?Rea Not Applicable  ?Some recent data might be hidden  ? ? ?

## 2021-10-28 NOTE — Progress Notes (Signed)
?Matthew [name]  ?ROUNDING NOTE  ? ?Subjective:  ? ?Matthew [name] is a 71 year old male with past medical conditions including obesity, A-fib on Eliquis, diastolic heart failure, COPD with as needed O2 use, and chronic [name] disease stage IV.  Patient presents to the emergency department with complaints of shortness of breath.  Patient was admitted for Rapid atrial fibrillation ([place]) [I48.91] ?Atrial fibrillation with rapid ventricular response ([place]) [I48.91] ?Acute respiratory failure with hypoxemia ([place]) [J96.01] ?Patient is known to our practice from previous admissions.  Patient was referred to our office for outpatient follow-up and has not yet scheduled an appointment.   ? ?Daily update: ?Patient resting comfortably ?Tolerating meals ?Denies shortness of breath ? ?Creatinine stable ?Urine output 1.15 L in preceding 24 hours ? ? ?Objective:  ?Vital signs in last 24 hours:  ?Temp:  [97.6 ?F (36.4 ?C)-98.3 ?F (36.8 ?C)] 97.7 ?F (36.5 ?C) (03/17 1104) ?Pulse Rate:  [50-66] 50 (03/17 1104) ?Resp:  [17-20] 17 (03/17 1104) ?BP: (98-124)/(52-71) 119/52 (03/17 1104) ?SpO2:  [100 %] 100 % (03/17 1104) ?Weight:  [125.4 kg] 125.4 kg (03/17 0400) ? ?Weight change: -0.408 kg ?Filed Weights  ? 10/27/21 0514 10/27/21 1040 10/28/21 0400  ?Weight: 130.3 kg 126.6 kg 125.4 kg  ? ? ?Intake/Output: ?I/O last 3 completed shifts: ?In: 1052.2 [P.O.:600; I.V.:452.2] ?Out: 1505 [HOZYY:4825] ?  ?Intake/Output this shift: ? Total I/O ?In: 240 [P.O.:240] ?Out: 550 [Urine:550] ? ?Physical Exam: ?General: No acute distress  ?Head: Moist oral mucosal membranes  ?Eyes: Anicteric  ?Lungs:  Clear bilateral, normal effort  ?Heart: Regular, no rubs  ?Abdomen:  Soft, nontender, obese  ?Extremities: No peripheral edema.  ?Neurologic: Awake, alert,oriented  ?Skin: No acute lesions or rashes  ?   ? ? ?Basic Metabolic Panel: ?Recent Labs  ?Lab 10/23/21 ?0037 10/24/21 ?0488 10/25/21 ?8916 10/26/21 ?9450 10/27/21 ?0503  ?NA 137 138 137 137  136  ?K 4.7 4.9 5.1 5.1 4.9  ?CL 102 100 101 101 101  ?CO2 '30 28 29 30 27  '$ ?GLUCOSE 112* 95 106* 97 98  ?BUN 60* 58* 61* 67* 65*  ?CREATININE 3.20* 2.74* 2.56* 2.62* 2.63*  ?CALCIUM 9.1 9.6 9.4 9.8 9.4  ?MG  --   --  2.4 2.3 2.5*  ?PHOS  --   --   --   --  4.8*  ? ? ? ?Liver Function Tests: ?No results for input(s): AST, ALT, ALKPHOS, BILITOT, PROT, ALBUMIN in the last 168 hours. ? ?No results for input(s): LIPASE, AMYLASE in the last 168 hours. ?No results for input(s): AMMONIA in the last 168 hours. ? ?CBC: ?Recent Labs  ?Lab 10/22/21 ?3888 10/23/21 ?2800 10/24/21 ?3491 10/25/21 ?7915 10/26/21 ?0541  ?WBC 4.3 4.3 4.7 4.2 5.1  ?HGB 8.0* 8.1* 8.3* 8.6* 8.9*  ?HCT 28.0* 28.3* 29.6* 30.6* 31.5*  ?MCV 93.3 94.0 95.8 95.6 95.5  ?PLT 267 261 294 284 287  ? ? ? ?Cardiac Enzymes: ?No results for input(s): CKTOTAL, CKMB, CKMBINDEX, TROPONINI in the last 168 hours. ? ?BNP: ?Invalid input(s): POCBNP ? ?CBG: ?Recent Labs  ?Lab 10/28/21 ?1135  ?GLUCAP 132*  ? ? ? ?Microbiology: ?Results for orders placed or performed during the hospital encounter of 10/17/21  ?Resp Panel by RT-PCR (Flu A&B, Covid) Nasopharyngeal Swab     Status: None  ? Collection Time: 10/17/21  7:16 PM  ? Specimen: Nasopharyngeal Swab; Nasopharyngeal(NP) swabs in vial transport medium  ?Result Value Ref Range Status  ? SARS Coronavirus 2 by RT PCR NEGATIVE NEGATIVE Final  ?  Comment: (NOTE) ?SARS-CoV-2 target nucleic acids are NOT DETECTED. ? ?The SARS-CoV-2 RNA is generally detectable in upper respiratory ?specimens during the acute phase of infection. The lowest ?concentration of SARS-CoV-2 viral copies this assay can detect is ?138 copies/mL. A negative result does not preclude SARS-Cov-2 ?infection and should not be used as the sole basis for treatment or ?other patient management decisions. A negative result may occur with  ?improper specimen collection/handling, submission of specimen other ?than nasopharyngeal swab, presence of viral mutation(s) within  the ?areas targeted by this assay, and inadequate number of viral ?copies(<138 copies/mL). A negative result must be combined with ?clinical observations, patient history, and epidemiological ?information. The expected result is Negative. ? ?Fact Sheet for Patients:  ?EntrepreneurPulse.com.au ? ?Fact Sheet for Healthcare Providers:  ?IncredibleEmployment.be ? ?This test is no t yet approved or cleared by the [place] FDA and  ?has been authorized for detection and/or diagnosis of SARS-CoV-2 by ?FDA under an Emergency Use Authorization (EUA). This EUA will remain  ?in effect (meaning this test can be used) for the duration of the ?COVID-19 declaration under Section 564(b)(1) of the Act, 21 ?U.S.C.section 360bbb-3(b)(1), unless the authorization is terminated  ?or revoked sooner.  ? ? ?  ? Influenza A by PCR NEGATIVE NEGATIVE Final  ? Influenza B by PCR NEGATIVE NEGATIVE Final  ?  Comment: (NOTE) ?The Xpert Xpress SARS-CoV-2/FLU/RSV plus assay is intended as an aid ?in the diagnosis of influenza from Nasopharyngeal swab specimens and ?should not be used as a sole basis for treatment. Nasal washings and ?aspirates are unacceptable for Xpert Xpress SARS-CoV-2/FLU/RSV ?testing. ? ?Fact Sheet for Patients: ?EntrepreneurPulse.com.au ? ?Fact Sheet for Healthcare Providers: ?IncredibleEmployment.be ? ?This test is not yet approved or cleared by the [place] FDA and ?has been authorized for detection and/or diagnosis of SARS-CoV-2 by ?FDA under an Emergency Use Authorization (EUA). This EUA will remain ?in effect (meaning this test can be used) for the duration of the ?COVID-19 declaration under Section 564(b)(1) of the Act, 21 U.S.C. ?section 360bbb-3(b)(1), unless the authorization is terminated or ?revoked. ? ?Performed at [facility], [place], ?[place] [address] ?  ?Blood culture (routine x 2)     Status: Abnormal  ?  Collection Time: 10/17/21  7:16 PM  ? Specimen: BLOOD  ?Result Value Ref Range Status  ? Specimen Description   Final  ?  BLOOD LEFT WRIST ?Performed at [facility], [address], [place], [place] [address] ?  ? Special Requests   Final  ?  BOTTLES DRAWN AEROBIC AND ANAEROBIC Blood Culture adequate volume ?Performed at [facility], [address] [place], [place] [address] ?  ? Culture  Setup Time   Final  ?  GRAM POSITIVE COCCI ?IN BOTH AEROBIC AND ANAEROBIC BOTTLES ?CRITICAL RESULT CALLED TO, READ BACK BY AND VERIFIED WITHLonie Peak PHARMD 1422 10/18/21 HNM ?  ? Culture (A)  Final  ?  STAPHYLOCOCCUS EPIDERMIDIS ?THE SIGNIFICANCE OF ISOLATING THIS ORGANISM FROM A SINGLE SET OF BLOOD CULTURES WHEN MULTIPLE SETS ARE DRAWN IS UNCERTAIN. PLEASE NOTIFY THE MICROBIOLOGY DEPARTMENT WITHIN ONE WEEK IF SPECIATION AND SENSITIVITIES ARE REQUIRED. ?Performed at [facility], [place] [address], [place], [place] [address] ?  ? Report Status 10/21/2021 FINAL  Final  ?Blood culture (routine x 2)     Status: None  ? Collection Time: 10/17/21  7:16 PM  ? Specimen: BLOOD  ?Result Value Ref Range Status  ? Specimen Description BLOOD RIGHT  ARM  Final  ? Special Requests   Final  ?  BOTTLES DRAWN AEROBIC AND ANAEROBIC Blood Culture results may not be optimal due to an inadequate volume of blood received in culture bottles  ? Culture   Final  ?  NO GROWTH 5 DAYS ?Performed at [facility], [address] [place], [place] [address] ?  ? Report Status 10/22/2021 FINAL  Final  ?Blood Culture ID Panel (Reflexed)     Status: Abnormal  ? Collection Time: 10/17/21  7:16 PM  ?Result Value Ref Range Status  ? Enterococcus faecalis NOT DETECTED NOT DETECTED Final  ? Enterococcus Faecium NOT DETECTED NOT DETECTED Final  ? Listeria monocytogenes NOT DETECTED NOT DETECTED Final  ? Staphylococcus species DETECTED (A) NOT DETECTED Final  ?  Comment: CRITICAL RESULT CALLED TO, READ BACK BY AND VERIFIED  WITH: Lonie Peak PHARMD 1422 10/18/21 HNM ?  ? Staphylococcus aureus (BCID) NOT DETECTED NOT DETECTED Final  ? Staphylococcus epidermidis DETECTED (A) NOT DETECTED Final  ?  Comment: CRITICAL RESULT CALLED T

## 2021-10-28 NOTE — Care Management Important Message (Signed)
Important Message ? ?Patient Details  ?Name: Matthew [name] ?MRN: [id number] ?Date of Birth: 07/14/1951 ? ? ?Medicare Important Message Given:  Yes ? ? ? ? ?Dannette Barbara ?10/28/2021, 3:18 PM ?

## 2021-10-29 DIAGNOSIS — I4891 Unspecified atrial fibrillation: Secondary | ICD-10-CM | POA: Diagnosis not present

## 2021-10-29 LAB — BASIC METABOLIC PANEL
Anion gap: 7 (ref 5–15)
BUN: 64 mg/dL — ABNORMAL HIGH (ref 8–23)
CO2: 28 mmol/L (ref 22–32)
Calcium: 9.2 mg/dL (ref 8.9–10.3)
Chloride: 100 mmol/L (ref 98–111)
Creatinine, Ser: 2.43 mg/dL — ABNORMAL HIGH (ref 0.61–1.24)
GFR, Estimated: 28 mL/min — ABNORMAL LOW (ref >60)
Glucose, Bld: 87 mg/dL (ref 70–99)
Potassium: 5.1 mmol/L (ref 3.5–5.1)
Sodium: 135 mmol/L (ref 135–145)

## 2021-10-29 LAB — CBC
HCT: 30.4 % — ABNORMAL LOW (ref 39.0–52.0)
Hemoglobin: 8.2 g/dL — ABNORMAL LOW (ref 13.0–17.0)
MCH: 26.7 pg (ref 26.0–34.0)
MCHC: 27 g/dL — ABNORMAL LOW (ref 30.0–36.0)
MCV: 99 fL (ref 80.0–100.0)
Platelets: 247 10*3/uL (ref 150–400)
RBC: 3.07 MIL/uL — ABNORMAL LOW (ref 4.22–5.81)
RDW: 14.6 % (ref 11.5–15.5)
WBC: 5.5 10*3/uL (ref 4.0–10.5)
nRBC: 0 % (ref 0.0–0.2)

## 2021-10-29 MED ORDER — APIXABAN 5 MG PO TABS: 5.0000 mg | ORAL_TABLET | Freq: Two times a day (BID) | ORAL | 2 refills | Status: AC

## 2021-10-29 MED ORDER — FUROSEMIDE 20 MG PO TABS
20.0000 mg | ORAL_TABLET | Freq: Every day | ORAL | Status: DC
Start: 2021-10-30 — End: 2024-05-23

## 2021-10-29 MED ORDER — METOPROLOL SUCCINATE ER 25 MG PO TB24: 12.5000 mg | ORAL_TABLET | Freq: Every day | ORAL | 1 refills | Status: DC

## 2021-10-29 MED ORDER — AMIODARONE HCL 400 MG PO TABS: 400.0000 mg | ORAL_TABLET | Freq: Two times a day (BID) | ORAL | 0 refills | Status: DC

## 2021-10-29 NOTE — Progress Notes (Signed)
?[facility]  ?PROGRESS NOTE  ? ?Subjective:  ? ?Patient seen at bedside.  Has some shortness of breath on and off. ? ?Objective:  ?Vital signs in last 24 hours:  ?Temp:  [97.9 ?F (36.6 ?C)-98.7 ?F (37.1 ?C)] 97.9 ?F (36.6 ?C) (03/18 1122) ?Pulse Rate:  [50-62] 62 (03/18 1122) ?Resp:  [17-20] 18 (03/18 1122) ?BP: (102-124)/(55-66) 102/66 (03/18 1122) ?SpO2:  [94 %-100 %] 100 % (03/18 1122) ?Weight:  [129.1 kg] 129.1 kg (03/18 0500) ? ?Weight change: 2.501 kg ?Filed Weights  ? 10/27/21 1040 10/28/21 0400 10/29/21 0500  ?Weight: 126.6 kg 125.4 kg 129.1 kg  ? ? ?Intake/Output: ?I/O last 3 completed shifts: ?In: 073 [P.O.:957] ?Out: 7106 [[id number]:5462] ?  ?Intake/Output this shift: ? Total I/O ?In: -  ?Out: 400 [Urine:400] ? ?Physical Exam: ?General:  No acute distress  ?Head:  Normocephalic, atraumatic. Moist oral mucosal membranes  ?Eyes:  Anicteric  ?Neck:  Supple  ?Lungs:   Clear to auscultation, normal effort  ?Heart:  S1S2 no rubs  ?Abdomen:   Soft, nontender, bowel sounds present  ?Extremities: 1+ peripheral edema.  ?Neurologic:  Awake, alert, following commands  ?Skin:  No lesions  ?Access:   ? ? ?Basic Metabolic Panel: ?Recent Labs  ?Lab 10/24/21 ?7035 10/25/21 ?0093 10/26/21 ?8182 10/27/21 ?0503 10/29/21 ?9937  ?NA 138 137 137 136 135  ?K 4.9 5.1 5.1 4.9 5.1  ?CL 100 101 101 101 100  ?CO2 '28 29 30 27 28  '$ ?GLUCOSE 95 106* 97 98 87  ?BUN 58* 61* 67* 65* 64*  ?CREATININE 2.74* 2.56* 2.62* 2.63* 2.43*  ?CALCIUM 9.6 9.4 9.8 9.4 9.2  ?MG  --  2.4 2.3 2.5*  --   ?PHOS  --   --   --  4.8*  --   ? ? ?CBC: ?Recent Labs  ?Lab 10/23/21 ?1696 10/24/21 ?7893 10/25/21 ?8101 10/26/21 ?7510 10/29/21 ?2585  ?WBC 4.3 4.7 4.2 5.1 5.5  ?HGB 8.1* 8.3* 8.6* 8.9* 8.2*  ?HCT 28.3* 29.6* 30.6* 31.5* 30.4*  ?MCV 94.0 95.8 95.6 95.5 99.0  ?PLT 261 294 284 287 247  ? ? ? ?Urinalysis: ?No results for input(s): COLORURINE, LABSPEC, [employer], GLUCOSEU, HGBUR, BILIRUBINUR, KETONESUR, PROTEINUR, UROBILINOGEN, NITRITE, LEUKOCYTESUR in  the last 72 hours. ? ?Invalid input(s): APPERANCEUR  ? ? ?Imaging: ?No results found. ? ? ?Medications:  ? ? ? amiodarone  400 mg Oral BID  ? apixaban  5 mg Oral BID  ? docusate sodium  200 mg Oral BID  ? DULoxetine  60 mg Oral BID  ? feeding supplement (GLUCERNA SHAKE)  237 mL Oral BID BM  ? furosemide  20 mg Oral Daily  ? melatonin  5 mg Oral QHS  ? metoprolol succinate  12.5 mg Oral Daily  ? mirtazapine  15 mg Oral QHS  ? mometasone-formoterol  2 puff Inhalation BID  ? multivitamin with minerals  1 tablet Oral Daily  ? Ensure Max Protein  11 oz Oral QHS  ? tamsulosin  0.4 mg Oral QPC supper  ? ? ?Assessment/ Plan:  ?   ?Principal Problem: ?  Rapid atrial fibrillation ([place]) ?Active Problems: ?  Hypertension ?  COPD (chronic obstructive pulmonary disease) ([place]) ?  Acute on chronic respiratory failure with hypoxia (HCC) ?  Acute on chronic diastolic CHF (congestive heart failure) ([place]) ?  CKD (chronic kidney disease) stage 4, GFR 15-29 ml/min (HCC) ?  Obesity, Class III, BMI 40-49.9 (morbid obesity) ([place]) ?  OSA (obstructive sleep apnea) ?  NICM (nonischemic  cardiomyopathy) ([place]) ?  Persistent atrial fibrillation ([place]) ? ?71 year old obese [place] male with a history of COPD, obstructive sleep apnea, hypertension, coronary artery disease, atrial fibrillation, congestive heart failure, chronic kidney disease stage IV now admitted with history of worsening shortness of breath and leg edema. ? ?#1: Acute kidney injury on the top of chronic kidney disease: Acute kidney injury is most likely secondary to ischemic nephropathy due to decreased cardiac output.  Presently on oral diuretics. ?Renal indicis are improving towards baseline. ? ?#2: Anemia: Patient has anemia most likely secondary to chronic kidney disease.  Will need iron supplementation. ? ?#3: Congestive heart failure/edema: Patient is advised to continue fluid restriction.  We will continue Lasix 20 mg orally for now.  Patient will benefit from switching to  torsemide which is a long-acting loop diuretic. ? ?#4: Secondary hyperparathyroidism: Patient needs PTH, calcium and phosphorus levels.  This can be done as an outpatient. ? ?#5: Paroxysmal atrial fibrillation: Patient had cardioversion done.  Patient is on amiodarone patient is followed by cardiology. ? ?#6: Gout: Patient can go back on allopurinol at 100 mg daily if need be. ? ?We will continue to monitor during hospital stay. ? ? LOS: 12 ?Lyla Son, MD ?[facility] ?3/18/202312:26 PM ?  ?

## 2021-10-29 NOTE — TOC Progression Note (Signed)
Transition of Care (TOC) - Progression Note  ? ? ?Patient Details  ?Name: Matthew [name] ?MRN: [id number] ?Date of Birth: 1950/10/25 ? ?Transition of Care (TOC) CM/SW Contact  ?Alberteen Sam, LCSW ?Phone Number: ?10/29/2021, 8:50 AM ? ?Clinical Narrative:    ? ?CSW received vm from patient's spouse stating that her and patient are in agreement with patient going home. Reports she is prepared with hospital bed and acknowledge home health already set up.  ? ?CSW will inform [facility] of dc today.  ? ? ? ?Expected Discharge Plan: Home/Self Care ?Barriers to Discharge: Continued Medical Work up ? ?Expected Discharge Plan and Services ?Expected Discharge Plan: Home/Self Care ?  ?Discharge Planning Services: CM Consult ?  ?Living arrangements for the past 2 months: [place] ?Expected Discharge Date: 10/29/21               ?DME Arranged: N/A ?DME Agency: NA ?  ?  ?  ?  ?  ?  ?  ?  ? ? ?Social Determinants of Health (SDOH) Interventions ?  ? ?Readmission Risk Interventions ?Readmission Risk Prevention Plan 10/18/2021  ?Transportation Screening Complete  ?Medication Review Press photographer) Complete  ?PCP or Specialist appointment within 3-5 days of discharge Complete  ?[place] or Home Care Consult Complete  ?SW Recovery Care/Counseling Consult Complete  ?Palliative Care Screening Not Applicable  ?[place] Not Applicable  ?Some recent data might be hidden  ? ? ?

## 2021-10-29 NOTE — Discharge Instructions (Signed)
Advised to follow-up with primary care physician in 1 week. ?Advised to follow-up with cardiology as scheduled. ?Advised to take amiodarone 400 mg twice daily for A-fib. ?Advised to take Eliquis 5 mg twice daily for anticoagulation. ?Patient underwent successful TEE with cardioversion to NSR ?

## 2021-10-29 NOTE — Progress Notes (Signed)
Discharge instructions explained to pt/ verbalized an understanding/ ivs and tele removed/ EMS to transport home  ?

## 2021-10-29 NOTE — Discharge Summary (Signed)
?Physician Discharge Summary ?  ?Patient: Matthew [name] MRN: [id number] DOB: [birth year]  ?Admit date:     10/17/2021  ?Discharge date: 10/29/21  ?Discharge Physician: Shawna Clamp  ? ?PCP: [name], MD  ? ?Recommendations at discharge:  ?Advised to follow-up with primary care physician in 1 week. ?Advised to follow-up with Cardiology as scheduled. ?Advised to take amiodarone 400 mg twice daily for A-fib. ?Advised to take Eliquis 5 mg twice daily for anticoagulation. ?Patient underwent successful TEE with cardioversion to NSR ? ? ?Discharge Diagnoses: ?Principal Problem: ?  Rapid atrial fibrillation ([place]) ?Active Problems: ?  Acute on chronic respiratory failure with hypoxia (HCC) ?  Acute on chronic diastolic CHF (congestive heart failure) ([place]) ?  Hypertension ?  COPD (chronic obstructive pulmonary disease) ([place]) ?  CKD (chronic kidney disease) stage 4, GFR 15-29 ml/min (HCC) ?  Obesity, Class III, BMI 40-49.9 (morbid obesity) ([place]) ?  OSA (obstructive sleep apnea) ?  NICM (nonischemic cardiomyopathy) ([place]) ?  Persistent atrial fibrillation ([place]) ? ?Resolved Problems: ?  * No resolved hospital problems. * ? ?Hospital Course: ?This 71 years old male with PMH significant for COPD, OSA not on CPAP, class IV obesity, CKD stage IV, hypertension, A-fib on Eliquis, diastolic CHF with last exacerbation in October 2022 presents in the ED with worsening shortness of breath.  Patient was placed on BiPAP. Patient was severely hypoxic requiring BiPAP on arrival.  EKG shows A-fib with RVR.  Patient was started on amiodarone infusion.  Patient was also started on doxycycline for possible bronchitis. ?Patient was admitted for A. Fib with RVR.  Cardiology consulted. Patient underwent cardioversion on 10/24/21  but it was unsuccessful.  Patient continued to remain in A-fib.  Patient was started on amiodarone infusion and underwent repeat cardioversion on 10/26/2021.  Patient now remains in normal sinus rhythm.  Amiodarone infusion  discontinued.  Patient changed on oral amiodarone 400 mg twice dialy.  PT and OT recommended skilled nursing facility but patient has improved. Home health services been arranged.  Patient continued to remain hypoxic requiring oxygen,  home oxygen arranged.  Patient is being discharged home. ? ?Assessment and Plan: ? ?Paroxysmal A-fib with RVR: ?Patient presented with severe shortness of breath, EKG shows A-fib with RVR. ?Likely due to medication noncompliance.  Patient was started on amiodarone infusion given low blood pressure.   ?Patient was not given Cardizem.  Resumed on metoprolol and Eliquis. ?S/p cardioversion on 10/24/21  which was unsuccessful as EKG still shows persistent A-fib. ?He underwent successful cardioversion to NSR on 10/26/2021. ?He remains in normal sinus rhythm but heart rate remains between 40-60. ?Cardiology recommended to discontinue amiodarone infusion. ?Amiodarone infusion discontinued and started on amiodarone 400 mg twice daily. ?  ?Acute on chronic systolic CHF: ?Acute hypoxic respiratory failure. ?He presented with severe shortness of breath requiring BiPAP on arrival. ?SPO2 88% on room air on arrival requiring BiPAP. ?Weaned off of the BiPAP now, continue supplemental oxygen, remains on 2L/m ?Continue IV Lasix 20 mg daily. ?Daily weight, continue intake output charting. ?Home oxygen therapy arranged. ?  ?  ?Intake/Output Summary (Last 24 hours) at 10/28/2021 1442 ?Last data filed at 10/28/2021 1100 ?   ?Gross per 24 hour  ?Intake 500.08 ml  ?Output 1430 ml  ?Net -929.92 ml  ?  ?Essential hypertension: ?Continue metoprolol. ?  ?COPD: ?No signs of exacerbation. ?Continue bronchodilators and encourage incentive spirometry. ?  ?AKI on CKD stage IV: ?Baseline serum creatinine 2.35. ?Serum creatinine trending down.  3.14>2.97>2.52>2.62 ?Avoid nephrotoxic  medications.  Good urine output. ?Serum creatinine back to baseline. ?  ?OSA: ?Not using CPAP at home. ?  ?BPH: ?Continue Flomax ?   ?Depression: ?Continue duloxetine, Remeron ?  ?Gout:  ?Patient had stopped allopurinol on self. ?  ?Obesity: ?Diet and exercise discussed in detail. ? ?Nutrition Status: ?Nutrition Problem: Increased nutrient needs ?Etiology: chronic illness (COPD) ?Signs/Symptoms: estimated needs ?Interventions: Glucerna shake, MVI, Premier Protein ?  ? ?  ?Pain control - [employer] Controlled Substance Reporting System database was reviewed. and patient was instructed, not to drive, operate heavy machinery, perform activities at heights, swimming or participation in water activities or provide baby-sitting services while on Pain, Sleep and Anxiety Medications; until their outpatient Physician has advised to do so again. Also recommended to not to take more than prescribed Pain, Sleep and Anxiety Medications.  ? ?Consultants: Cardiology ? ?Procedures performed: TEE with cardioversion X 2 ?Disposition: Home ?Diet recommendation:  ?Discharge Diet Orders (From admission, onward)  ? ?  Start     Ordered  ? 10/29/21 0000  Diet - low sodium heart healthy       ? 10/29/21 1059  ? 10/29/21 0000  Diet Carb Modified       ? 10/29/21 1059  ? ?  ?  ? ?  ? ?Carb modified diet ?DISCHARGE MEDICATION: ?Allergies as of 10/29/2021   ? ?   Reactions  ? Demerol [meperidine Hcl]   ? Lisinopril Other (See Comments)  ? Hypotensive   ? Meperidine And Related   ? ?  ? ?  ?Medication List  ?  ? ?STOP taking these medications   ? ?acetaminophen 325 MG tablet ?Commonly known as: TYLENOL ?  ?allopurinol 100 MG tablet ?Commonly known as: ZYLOPRIM ?  ?colchicine 0.6 MG tablet ?  ?cyanocobalamin 1000 MCG tablet ?  ?diltiazem 300 MG 24 hr capsule ?Commonly known as: CARDIZEM CD ?  ?hydrOXYzine 25 MG tablet ?Commonly known as: ATARAX ?  ?melatonin 5 MG Tabs ?  ?metoprolol tartrate 50 MG tablet ?Commonly known as: LOPRESSOR ?  ?mometasone-formoterol 200-5 MCG/ACT Aero ?Commonly known as: DULERA ?  ?multivitamin with minerals Tabs tablet ?  ?potassium chloride SA  20 MEQ tablet ?Commonly known as: KLOR-CON M ?  ?tamsulosin 0.4 MG Caps capsule ?Commonly known as: FLOMAX ?  ? ?  ? ?TAKE these medications   ? ?albuterol 108 (90 Base) MCG/ACT inhaler ?Commonly known as: VENTOLIN HFA ?INHALE 1 PUFF BY MOUTH INTO LUNGS DAILY ?  ?amiodarone 400 MG tablet ?Commonly known as: PACERONE ?Take 1 tablet (400 mg total) by mouth 2 (two) times daily. ?  ?apixaban 5 MG Tabs tablet ?Commonly known as: Eliquis ?Take 1 tablet (5 mg total) by mouth 2 (two) times daily. ?What changed: See the new instructions. ?  ?bisacodyl 5 MG EC tablet ?Commonly known as: DULCOLAX ?Take 1 tablet (5 mg total) by mouth daily as needed for moderate constipation. ?  ?DULoxetine 60 MG capsule ?Commonly known as: CYMBALTA ?Take 1 capsule (60 mg total) by mouth 2 (two) times daily. ?  ?furosemide 20 MG tablet ?Commonly known as: LASIX ?Take 1 tablet (20 mg total) by mouth daily. ?Start taking on: October 30, 2021 ?What changed: See the new instructions. ?  ?ipratropium 0.02 % nebulizer solution ?Commonly known as: ATROVENT ?SMARTSIG:3 Milliliter(s) Via Nebulizer Every 6 Hours PRN ?  ?ipratropium-albuterol 0.5-2.5 (3) MG/3ML Soln ?Commonly known as: DUONEB ?USE 1 VIAL VIA NEBULIZER EVERY 6 HOURS AS NEEDED ?  ?metoprolol succinate 25 MG 24 hr  tablet ?Commonly known as: TOPROL-XL ?Take 0.5 tablets (12.5 mg total) by mouth daily. ?Start taking on: October 30, 2021 ?  ?MiraLax 17 GM/SCOOP powder ?Generic drug: polyethylene glycol powder ?SMARTSIG:17 Gram(s) By Mouth Daily PRN ?What changed: Another medication with the same name was removed. Continue taking this medication, and follow the directions you see here. ?  ?mirtazapine 15 MG tablet ?Commonly known as: REMERON ?Take 1 tablet (15 mg total) by mouth at bedtime. ?  ? ?  ? ?  ?  ? ? ?  ?Durable Medical Equipment  ?(From admission, onward)  ?  ? ? ?  ? ?  Start     Ordered  ? 10/29/21 1059  DME Oxygen  Once       ?Question Answer Comment  ?Length of Need Lifetime   ?Mode or  (Route) Nasal cannula   ?Liters per Minute 3   ?Frequency Continuous (stationary and portable oxygen unit needed)   ?Oxygen conserving device Yes   ?Oxygen delivery system Gas   ?  ? 10/29/21 1059  ? 10/21/21

## 2021-10-29 NOTE — Plan of Care (Signed)

## 2021-10-29 NOTE — Progress Notes (Deleted)
SATURATION QUALIFICATIONS: (This note is used to comply with regulatory documentation for home oxygen) ? ?Patient Saturations on Room Air at Rest = 86% ? ?94% = 2.5L 02 ?

## 2021-10-29 NOTE — Progress Notes (Signed)
SATURATION QUALIFICATIONS: (This note is used to comply with regulatory documentation for home oxygen) ? ?Patient Saturations on Room Air at Rest = 86% ? ? ?Patient Saturations on 2.5 Liters of oxygen while Ambulating = 94% ? ? ?

## 2021-10-30 NOTE — Progress Notes (Deleted)
? Patient ID: Matthew [name], male    DOB: [birth year], 71 y.o.   MRN: [id number] ? ?HPI ? ?Mr Henshaw is a 71 y/o male with a history of atrial fibrillation, asthma, CAD, HTN, CKD, anemia, anxiety, COPD, depression, gout, osteoporosis, sleep apnea and chronic heart failure.  ? ?TEE report from 10/24/21 reviewed and showed an EF of 30-35% along with mild MR and mild/moderate TR. Cardioversion successful on 2nd attempt. Echo report from 06/13/21 reviewed and showed an EF of 50-55% along with mild LAE and trivial MR.  ? ?LHC done 10/17/2018 showed: ?Normal left ventricular function ?Normal coronaries ? ?Admitted 10/17/21 due to worsening SOB. Initially placed on bipap due to hypoxia. Started on amiodarone infusion due to AF with RVR. Cardiology and nephrology consults obtained. Cardioversion successful on 2nd attempt. Amiodarone drip transitioned to oral medication. PT/OT evaluations done. Weaned off bipap to O2 nasal cannula. IV lasix changed to oral diuretic. Discharged after 12 days. Admitted 07/11/21 due to foot pain. Found to be in AF RVR along with being anemic. Cardiology consult obtained. Initially given IV lasix but then stopped due to worsening renal function. Medication for afib started. Transfused 1 unit of PRBC's. Discharged after 7 days to SNF.  ? ?He presents today for a follow-up visit with a chief complaint of  ? ?Past Medical History:  ?Diagnosis Date  ? Anemia   ? Anxiety   ? Arrhythmia   ? atrial fibrillation  ? Asthma   ? CAD (coronary artery disease)   ? CHF (congestive heart failure) ([place])   ? Chronic kidney disease   ? COPD (chronic obstructive pulmonary disease) ([place])   ? Depression   ? Edema   ? Gout   ? Hip dislocation, bilateral ([place])   ? Hypertension   ? Osteoporosis   ? Sleep apnea   ? ?Past Surgical History:  ?Procedure Laterality Date  ? ABDOMINAL SURGERY    ? CARDIOVERSION N/A 10/24/2021  ? Procedure: CARDIOVERSION;  Surgeon: Kate Sable, MD;  Location: [facility];  Service: Cardiovascular;   Laterality: N/A;  ? CARDIOVERSION N/A 10/26/2021  ? Procedure: CARDIOVERSION;  Surgeon: Kate Sable, MD;  Location: [facility];  Service: Cardiovascular;  Laterality: N/A;  ? CHOLECYSTECTOMY    ? COLOSTOMY    ? COLOSTOMY TAKEDOWN    ? HERNIA REPAIR    ? LEFT HEART CATH AND CORONARY ANGIOGRAPHY N/A 10/17/2018  ? Procedure: LEFT HEART CATH AND CORONARY ANGIOGRAPHY with possible PCI and stent;  Surgeon: Yolonda Kida, MD;  Location: [place] [facility];  Service: Cardiovascular;  Laterality: N/A;  ? ORIF FEMUR FRACTURE Left 05/19/2020  ? Procedure: OPEN REDUCTION INTERNAL FIXATION (ORIF) DISTAL FEMUR FRACTURE;  Surgeon: Shona Needles, MD;  Location: [place];  Service: Orthopedics;  Laterality: Left;  ? TEE WITHOUT CARDIOVERSION N/A 10/24/2021  ? Procedure: TRANSESOPHAGEAL ECHOCARDIOGRAM (TEE);  Surgeon: Kate Sable, MD;  Location: [facility];  Service: Cardiovascular;  Laterality: N/A;  ? TOTAL HIP ARTHROPLASTY Bilateral   ? ?Family History  ?Problem Relation Age of Onset  ? COPD Mother   ? Cancer Mother   ? Melanoma Father   ? ?Social History  ? ?Tobacco Use  ? Smoking status: Never  ? Smokeless tobacco: Never  ?Substance Use Topics  ? Alcohol use: Not Currently  ? ?Allergies  ?Allergen Reactions  ? Demerol [Meperidine Hcl]   ? Lisinopril Other (See Comments)  ?  Hypotensive   ? Meperidine And Related   ? ? ?Review of Systems  ?  Constitutional:  Positive for fatigue. Negative for appetite change.  ?HENT:  Negative for congestion, postnasal drip and sore throat.   ?Eyes: Negative.   ?Respiratory:  Positive for cough and shortness of breath. Negative for chest tightness.   ?Cardiovascular:  Negative for chest pain, palpitations and leg swelling.  ?Gastrointestinal:  Positive for abdominal pain. Negative for abdominal distention.  ?Endocrine: Negative.   ?Genitourinary: Negative.   ?Musculoskeletal:  Positive for arthralgias (left shoulder).  ?Skin:  Positive for wound.  ?     Says that he has a wound on his  "backside"  ?Allergic/Immunologic: Negative.   ?Neurological:  Positive for dizziness. Negative for light-headedness.  ?Hematological:  Negative for adenopathy. Bruises/bleeds easily.  ?Psychiatric/Behavioral:  Negative for dysphoric mood and sleep disturbance (trouble falling asleep; sleeping on 1 pillow). The patient is nervous/anxious.   ? ? ? ?Physical Exam ?Vitals and nursing note reviewed.  ?Constitutional:   ?   Appearance: He is well-developed.  ?HENT:  ?   Head: Normocephalic and atraumatic.  ?Neck:  ?   Vascular: No JVD.  ?Cardiovascular:  ?   Rate and Rhythm: Tachycardia present. Rhythm irregular.  ?Pulmonary:  ?   Effort: Pulmonary effort is normal.  ?   Breath sounds: Decreased breath sounds and rhonchi (throughout all lung fields) present. No wheezing or rales.  ?Abdominal:  ?   General: There is no distension.  ?   Palpations: Abdomen is soft.  ?Musculoskeletal:     ?   General: No tenderness.  ?   Cervical back: Normal range of motion and neck supple.  ?   Right lower leg: No edema.  ?   Left lower leg: No edema.  ?Skin: ?   General: Skin is warm and dry.  ?Neurological:  ?   General: No focal deficit present.  ?   Mental Status: He is alert and oriented to person, place, and time.  ?Psychiatric:     ?   Mood and Affect: Mood is anxious.     ?   Behavior: Behavior normal.     ?   Thought Content: Thought content normal.  ? ?Assessment & Plan: ? ?1: Chronic heart failure with now reduced ejection fraction- ?- NYHA class III ?- euvolemic today ?-weighed daily and to call for an overnight weight gain of > 2 pounds or a weekly weight gain of > 5 pounds ?- did not weigh patient in office because he was unsure if he could stand long enough ?- on GDMT of  ?- sees cardiology ([place]) 08/30/21 ?- not adding salt to his food ?- wearing oxygen at 2L  ?- BNP 10/17/21 was 691.7 ? ?2: HTN with CKD- ?- BP  ?- saw PCP (Masoud) 09/14/21 ?- BMP 10/29/21 reviewed and showed sodium 135, potassium 5.1, creatinine 2.43 and GFR  28 ?- nephrology referral placed at last visit  ? ?3: Atrial fibrillation- ?- needs [facility] hospital f/u scheduled ?- currently taking apixaban, diltiazem and metoprolol ?- underwent cardioversion during recent admission ? ?4: Anemia- ?- hemoglobin 10/29/21 was 8.2 ? ?5: Anxiety- ?- appears under control at this time ?- he understands that his anxiety makes him more short of breath which makes him more anxious ?- currently taking duloxetine ? ?6: Obstructive sleep apnea- ?- had been wearing CPAP but says that his home equipment is broke and he hasn't been able to find someone to fix it ?- facility says that patient will need a sleep study ? ? ? ? ?

## 2021-10-31 ENCOUNTER — Telehealth: Payer: Self-pay | Admitting: Emergency Medicine

## 2021-10-31 ENCOUNTER — Ambulatory Visit: Payer: Medicare HMO | Admitting: Family

## 2021-10-31 NOTE — Telephone Encounter (Signed)
Transition Care Management Unsuccessful Follow-up Telephone Call ? ?Date of discharge and from where:  10/29/2021 from [facility] ? ?Attempts:  1st Attempt ? ?Reason for unsuccessful TCM follow-up call:  Left voice message ? ?  ?

## 2021-10-31 NOTE — Telephone Encounter (Signed)
-----   Message from Nelva Bush, MD sent at 10/28/2021  5:21 PM EDT ----- ?Regarding: TCM f/u ?Hello, ? ?Mr. Hughson will likely be discharged later today or over the weekend.  Could you help arrange for TCM follow-up with me or an APP in about 2 weeks?  Thanks. ? ?Nelva Bush, MD ?[facility] ? ? ? ?

## 2021-11-02 NOTE — Telephone Encounter (Signed)
Transition Care Management Unsuccessful Follow-up Telephone Call ? ?Date of discharge and from where:  10/29/2021 from [facility] ? ?Attempts:  2nd Attempt ? ?Reason for unsuccessful TCM follow-up call:  Left voice message ? ?  ?

## 2021-11-02 NOTE — Telephone Encounter (Signed)
Lmom to schedule hosp f/u.

## 2021-11-03 NOTE — Telephone Encounter (Signed)
Transition Care Management Unsuccessful Follow-up Telephone Call ?  ?Date of discharge and from where:  10/29/2021 from [facility] ?  ?Attempts:  3rd Attempt ?  ?Reason for unsuccessful TCM follow-up call:  Left voice message ?

## 2021-11-07 ENCOUNTER — Other Ambulatory Visit: Payer: Self-pay | Admitting: *Deleted

## 2021-11-07 MED ORDER — MOMETASONE FURO-FORMOTEROL FUM 200-5 MCG/ACT IN AERO: 2.0000 | INHALATION_SPRAY | Freq: Two times a day (BID) | RESPIRATORY_TRACT | 4 refills | Status: DC

## 2021-11-09 ENCOUNTER — Ambulatory Visit: Payer: Medicare HMO | Admitting: Nurse Practitioner

## 2021-12-01 ENCOUNTER — Telehealth: Payer: Self-pay | Admitting: Family

## 2021-12-01 ENCOUNTER — Ambulatory Visit: Payer: Medicare HMO | Admitting: Family

## 2021-12-01 NOTE — Telephone Encounter (Signed)
Patient did not show for his [facility] Failure [facility] appointment on 12/01/21. Will attempt to reschedule.   ?

## 2021-12-05 DIAGNOSIS — I5031 Acute diastolic (congestive) heart failure: Secondary | ICD-10-CM | POA: Diagnosis not present

## 2021-12-05 DIAGNOSIS — J9601 Acute respiratory failure with hypoxia: Secondary | ICD-10-CM | POA: Diagnosis not present

## 2021-12-05 DIAGNOSIS — J45901 Unspecified asthma with (acute) exacerbation: Secondary | ICD-10-CM | POA: Diagnosis not present

## 2021-12-05 DIAGNOSIS — J441 Chronic obstructive pulmonary disease with (acute) exacerbation: Secondary | ICD-10-CM | POA: Diagnosis not present

## 2021-12-20 ENCOUNTER — Other Ambulatory Visit: Payer: Self-pay | Admitting: Internal Medicine

## 2022-01-04 DIAGNOSIS — I5031 Acute diastolic (congestive) heart failure: Secondary | ICD-10-CM | POA: Diagnosis not present

## 2022-01-04 DIAGNOSIS — J45901 Unspecified asthma with (acute) exacerbation: Secondary | ICD-10-CM | POA: Diagnosis not present

## 2022-01-04 DIAGNOSIS — J9601 Acute respiratory failure with hypoxia: Secondary | ICD-10-CM | POA: Diagnosis not present

## 2022-01-04 DIAGNOSIS — J441 Chronic obstructive pulmonary disease with (acute) exacerbation: Secondary | ICD-10-CM | POA: Diagnosis not present

## 2022-01-16 ENCOUNTER — Other Ambulatory Visit: Payer: Self-pay | Admitting: Internal Medicine

## 2022-01-16 DIAGNOSIS — F329 Major depressive disorder, single episode, unspecified: Secondary | ICD-10-CM

## 2022-01-18 ENCOUNTER — Other Ambulatory Visit: Payer: Self-pay | Admitting: Internal Medicine

## 2022-01-18 DIAGNOSIS — I5033 Acute on chronic diastolic (congestive) heart failure: Secondary | ICD-10-CM

## 2022-02-04 DIAGNOSIS — I5031 Acute diastolic (congestive) heart failure: Secondary | ICD-10-CM | POA: Diagnosis not present

## 2022-02-04 DIAGNOSIS — J9601 Acute respiratory failure with hypoxia: Secondary | ICD-10-CM | POA: Diagnosis not present

## 2022-02-04 DIAGNOSIS — J441 Chronic obstructive pulmonary disease with (acute) exacerbation: Secondary | ICD-10-CM | POA: Diagnosis not present

## 2022-02-04 DIAGNOSIS — J45901 Unspecified asthma with (acute) exacerbation: Secondary | ICD-10-CM | POA: Diagnosis not present

## 2022-03-17 ENCOUNTER — Other Ambulatory Visit: Payer: Self-pay | Admitting: Internal Medicine

## 2022-03-17 DIAGNOSIS — F329 Major depressive disorder, single episode, unspecified: Secondary | ICD-10-CM

## 2022-03-24 ENCOUNTER — Other Ambulatory Visit: Payer: Self-pay

## 2022-03-24 NOTE — Patient Outreach (Signed)
[facility]) Care Management  03/24/2022  Matthew [name] [facility] Nov 26, 1950 [id number]   Telephone Screen    Outreach call to patient to introduce [facility] services and assess care needs as part of benefit of PCP office and insurance plan. No answer. RN CM left HIPAA compliant voicemail message along with contact info.    Plan: RN CM will make outreach attempt to patient within 4 business days. RN CM will send unsuccessful outreach letter to patient.  Enzo Montgomery, RN,BSN,CCM [place] Management Telephonic Care Management Coordinator Direct Phone: [phone] Toll Free: [phone] Fax: [phone]

## 2022-03-28 ENCOUNTER — Other Ambulatory Visit: Payer: Self-pay

## 2022-03-28 ENCOUNTER — Ambulatory Visit: Payer: Self-pay

## 2022-03-28 NOTE — Patient Outreach (Signed)
[facility]) Care Management  03/28/2022  Matthew [name] [facility] 1950-12-11 [id number]   Telephone Screen       Unsuccessful outreach call to patient to introduce [facility] services and assess care needs as part of benefit of PCP office and insurance plan.        Plan: RN CM will make outreach attempt to patient within 4 business days.  Enzo Montgomery, RN,BSN,CCM [place] Management Telephonic Care Management Coordinator Direct Phone: [phone] Toll Free: [phone] Fax: [phone]

## 2022-03-29 DIAGNOSIS — I4891 Unspecified atrial fibrillation: Secondary | ICD-10-CM | POA: Diagnosis not present

## 2022-03-30 ENCOUNTER — Other Ambulatory Visit: Payer: Self-pay

## 2022-03-30 NOTE — Patient Outreach (Signed)
[facility]) Care Management  03/30/2022  WILLLIAM PETTET 1951/01/15 [id number]   Telephone Screen   Outreach call to patient to introduce [facility] services and assess care needs as part of benefit of PCP office and insurance plan. Call completed with caregiver/spouse-Betty. She reports patient having some dementia and she manages his care. Confirmed patient has both Medicare and Medicaid. Reviewed services that patient may be eligible for(transportation,in home support,etc) through insurance providers. Caregiver appreciative of info and will follow up.   Main healthcare issue/concern today: She states that patient was in the nursing home in Jan and came home.Since then he has not been willing to work with HHPT to regain mobility and strength. He does not want to walk or do anything but lay in bed.    Health Maintenance/Care Gaps: -Last AWV: Not on file. Caregiver states that patient physically unable to get to appts as he doesn't walk and too much for him to leave home. She is aware that patient overdue to see MDs.Provided resources on MD agencies that make home visits. She will review them and contact them to get more info and questions answered.     Enzo Montgomery, RN,BSN,CCM [place] Management Telephonic Care Management Coordinator Direct Phone: [phone] Toll Free: [phone] Fax: [phone]

## 2022-04-01 DIAGNOSIS — I4891 Unspecified atrial fibrillation: Secondary | ICD-10-CM | POA: Diagnosis not present

## 2022-04-29 DIAGNOSIS — I4891 Unspecified atrial fibrillation: Secondary | ICD-10-CM | POA: Diagnosis not present

## 2022-05-03 ENCOUNTER — Other Ambulatory Visit: Payer: Self-pay

## 2022-05-03 ENCOUNTER — Emergency Department: Payer: Medicare HMO

## 2022-05-03 ENCOUNTER — Encounter: Payer: Self-pay | Admitting: Emergency Medicine

## 2022-05-03 ENCOUNTER — Inpatient Hospital Stay
Admission: EM | Admit: 2022-05-03 | Discharge: 2022-05-13 | DRG: 689 | Disposition: A | Discharge status: Skilled Nursing Facility | Payer: Medicare HMO | Attending: Internal Medicine | Admitting: Internal Medicine

## 2022-05-03 DIAGNOSIS — E871 Hypo-osmolality and hyponatremia: Secondary | ICD-10-CM

## 2022-05-03 DIAGNOSIS — Z825 Family history of asthma and other chronic lower respiratory diseases: Secondary | ICD-10-CM

## 2022-05-03 DIAGNOSIS — I482 Chronic atrial fibrillation, unspecified: Secondary | ICD-10-CM | POA: Diagnosis not present

## 2022-05-03 DIAGNOSIS — E669 Obesity, unspecified: Secondary | ICD-10-CM | POA: Diagnosis present

## 2022-05-03 DIAGNOSIS — F29 Unspecified psychosis not due to a substance or known physiological condition: Secondary | ICD-10-CM | POA: Diagnosis not present

## 2022-05-03 DIAGNOSIS — R109 Unspecified abdominal pain: Principal | ICD-10-CM

## 2022-05-03 DIAGNOSIS — G9341 Metabolic encephalopathy: Secondary | ICD-10-CM | POA: Diagnosis present

## 2022-05-03 DIAGNOSIS — E440 Moderate protein-calorie malnutrition: Secondary | ICD-10-CM | POA: Diagnosis present

## 2022-05-03 DIAGNOSIS — F03918 Unspecified dementia, unspecified severity, with other behavioral disturbance: Secondary | ICD-10-CM | POA: Diagnosis present

## 2022-05-03 DIAGNOSIS — F05 Delirium due to known physiological condition: Secondary | ICD-10-CM | POA: Diagnosis not present

## 2022-05-03 DIAGNOSIS — N184 Chronic kidney disease, stage 4 (severe): Secondary | ICD-10-CM | POA: Diagnosis present

## 2022-05-03 DIAGNOSIS — M19012 Primary osteoarthritis, left shoulder: Secondary | ICD-10-CM | POA: Diagnosis not present

## 2022-05-03 DIAGNOSIS — Z888 Allergy status to other drugs, medicaments and biological substances status: Secondary | ICD-10-CM

## 2022-05-03 DIAGNOSIS — R531 Weakness: Secondary | ICD-10-CM

## 2022-05-03 DIAGNOSIS — N10 Acute tubulo-interstitial nephritis: Principal | ICD-10-CM | POA: Diagnosis present

## 2022-05-03 DIAGNOSIS — I130 Hypertensive heart and chronic kidney disease with heart failure and stage 1 through stage 4 chronic kidney disease, or unspecified chronic kidney disease: Secondary | ICD-10-CM | POA: Diagnosis not present

## 2022-05-03 DIAGNOSIS — M25812 Other specified joint disorders, left shoulder: Secondary | ICD-10-CM | POA: Diagnosis not present

## 2022-05-03 DIAGNOSIS — R519 Headache, unspecified: Secondary | ICD-10-CM | POA: Diagnosis not present

## 2022-05-03 DIAGNOSIS — M779 Enthesopathy, unspecified: Secondary | ICD-10-CM | POA: Diagnosis present

## 2022-05-03 DIAGNOSIS — I2510 Atherosclerotic heart disease of native coronary artery without angina pectoris: Secondary | ICD-10-CM | POA: Diagnosis present

## 2022-05-03 DIAGNOSIS — M6259 Muscle wasting and atrophy, not elsewhere classified, multiple sites: Secondary | ICD-10-CM | POA: Diagnosis not present

## 2022-05-03 DIAGNOSIS — R001 Bradycardia, unspecified: Secondary | ICD-10-CM | POA: Diagnosis not present

## 2022-05-03 DIAGNOSIS — I10 Essential (primary) hypertension: Secondary | ICD-10-CM | POA: Diagnosis not present

## 2022-05-03 DIAGNOSIS — Z20822 Contact with and (suspected) exposure to covid-19: Secondary | ICD-10-CM | POA: Diagnosis present

## 2022-05-03 DIAGNOSIS — J449 Chronic obstructive pulmonary disease, unspecified: Secondary | ICD-10-CM | POA: Diagnosis not present

## 2022-05-03 DIAGNOSIS — N1330 Unspecified hydronephrosis: Secondary | ICD-10-CM | POA: Diagnosis not present

## 2022-05-03 DIAGNOSIS — Z7901 Long term (current) use of anticoagulants: Secondary | ICD-10-CM | POA: Diagnosis not present

## 2022-05-03 DIAGNOSIS — E876 Hypokalemia: Secondary | ICD-10-CM | POA: Diagnosis present

## 2022-05-03 DIAGNOSIS — Z6830 Body mass index (BMI) 30.0-30.9, adult: Secondary | ICD-10-CM | POA: Diagnosis not present

## 2022-05-03 DIAGNOSIS — G4733 Obstructive sleep apnea (adult) (pediatric): Secondary | ICD-10-CM | POA: Diagnosis not present

## 2022-05-03 DIAGNOSIS — I5022 Chronic systolic (congestive) heart failure: Secondary | ICD-10-CM | POA: Diagnosis not present

## 2022-05-03 DIAGNOSIS — Z79899 Other long term (current) drug therapy: Secondary | ICD-10-CM

## 2022-05-03 DIAGNOSIS — R9431 Abnormal electrocardiogram [ECG] [EKG]: Secondary | ICD-10-CM

## 2022-05-03 DIAGNOSIS — M6281 Muscle weakness (generalized): Secondary | ICD-10-CM | POA: Diagnosis not present

## 2022-05-03 DIAGNOSIS — Z885 Allergy status to narcotic agent status: Secondary | ICD-10-CM | POA: Diagnosis not present

## 2022-05-03 DIAGNOSIS — M25512 Pain in left shoulder: Secondary | ICD-10-CM | POA: Diagnosis present

## 2022-05-03 DIAGNOSIS — I428 Other cardiomyopathies: Secondary | ICD-10-CM | POA: Diagnosis not present

## 2022-05-03 DIAGNOSIS — Z7401 Bed confinement status: Secondary | ICD-10-CM | POA: Diagnosis not present

## 2022-05-03 DIAGNOSIS — R1032 Left lower quadrant pain: Secondary | ICD-10-CM | POA: Diagnosis not present

## 2022-05-03 DIAGNOSIS — E875 Hyperkalemia: Secondary | ICD-10-CM | POA: Diagnosis not present

## 2022-05-03 DIAGNOSIS — F32A Depression, unspecified: Secondary | ICD-10-CM | POA: Diagnosis present

## 2022-05-03 DIAGNOSIS — N3289 Other specified disorders of bladder: Secondary | ICD-10-CM | POA: Diagnosis not present

## 2022-05-03 HISTORY — DX: Personal history of urinary calculi: Z87.442

## 2022-05-03 LAB — URINALYSIS, ROUTINE W REFLEX MICROSCOPIC
Bilirubin Urine: NEGATIVE
Glucose, UA: NEGATIVE mg/dL
Ketones, ur: NEGATIVE mg/dL
Nitrite: NEGATIVE
Protein, ur: NEGATIVE mg/dL
Specific Gravity, Urine: 1.008 (ref 1.005–1.030)
pH: 5 (ref 5.0–8.0)

## 2022-05-03 LAB — COMPREHENSIVE METABOLIC PANEL
ALT: 8 U/L (ref 0–44)
AST: 18 U/L (ref 15–41)
Albumin: 2.6 g/dL — ABNORMAL LOW (ref 3.5–5.0)
Alkaline Phosphatase: 102 U/L (ref 38–126)
Anion gap: 10 (ref 5–15)
BUN: 21 mg/dL (ref 8–23)
CO2: 25 mmol/L (ref 22–32)
Calcium: 9.5 mg/dL (ref 8.9–10.3)
Chloride: 99 mmol/L (ref 98–111)
Creatinine, Ser: 2.97 mg/dL — ABNORMAL HIGH (ref 0.61–1.24)
GFR, Estimated: 22 mL/min — ABNORMAL LOW (ref >60)
Glucose, Bld: 122 mg/dL — ABNORMAL HIGH (ref 70–99)
Potassium: 3.2 mmol/L — ABNORMAL LOW (ref 3.5–5.1)
Sodium: 134 mmol/L — ABNORMAL LOW (ref 135–145)
Total Bilirubin: 0.8 mg/dL (ref 0.3–1.2)
Total Protein: 7 g/dL (ref 6.5–8.1)

## 2022-05-03 LAB — CBC
HCT: 39.2 % (ref 39.0–52.0)
Hemoglobin: 12 g/dL — ABNORMAL LOW (ref 13.0–17.0)
MCH: 29.8 pg (ref 26.0–34.0)
MCHC: 30.6 g/dL (ref 30.0–36.0)
MCV: 97.3 fL (ref 80.0–100.0)
Platelets: 340 10*3/uL (ref 150–400)
RBC: 4.03 MIL/uL — ABNORMAL LOW (ref 4.22–5.81)
RDW: 14.8 % (ref 11.5–15.5)
WBC: 9.9 10*3/uL (ref 4.0–10.5)
nRBC: 0 % (ref 0.0–0.2)

## 2022-05-03 LAB — MAGNESIUM: Magnesium: 2.3 mg/dL (ref 1.7–2.4)

## 2022-05-03 LAB — SARS CORONAVIRUS 2 BY RT PCR: SARS Coronavirus 2 by RT PCR: NEGATIVE

## 2022-05-03 MED ORDER — MOMETASONE FURO-FORMOTEROL FUM 200-5 MCG/ACT IN AERO: 2.0000 | INHALATION_SPRAY | Freq: Two times a day (BID) | RESPIRATORY_TRACT | Status: DC

## 2022-05-03 MED ORDER — METOPROLOL SUCCINATE ER 25 MG PO TB24: 12.5000 mg | ORAL_TABLET | Freq: Every day | ORAL | Status: DC

## 2022-05-03 MED ORDER — TRAZODONE HCL 50 MG PO TABS
25.0000 mg | ORAL_TABLET | Freq: Every evening | ORAL | Status: DC | PRN
  Administered 2022-05-03 – 2022-05-10 (×5): 25 mg via ORAL
  Filled 2022-05-03 (×5): qty 1

## 2022-05-03 MED ORDER — SODIUM CHLORIDE 0.9 % IV SOLN
1.0000 g | INTRAVENOUS | Status: DC
  Administered 2022-05-04 – 2022-05-10 (×7): 1 g via INTRAVENOUS
  Filled 2022-05-03 (×4): qty 1
  Filled 2022-05-03: qty 10
  Filled 2022-05-03 (×2): qty 1

## 2022-05-03 MED ORDER — APIXABAN 5 MG PO TABS
5.0000 mg | ORAL_TABLET | Freq: Two times a day (BID) | ORAL | Status: DC
  Administered 2022-05-03 – 2022-05-13 (×21): 5 mg via ORAL
  Filled 2022-05-03 (×21): qty 1

## 2022-05-03 MED ORDER — LACTATED RINGERS IV BOLUS
1000.0000 mL | Freq: Once | INTRAVENOUS | Status: AC
  Administered 2022-05-03: 1000 mL via INTRAVENOUS

## 2022-05-03 MED ORDER — IPRATROPIUM BROMIDE 0.02 % IN SOLN: 0.5000 mg | Freq: Four times a day (QID) | RESPIRATORY_TRACT | Status: DC | PRN

## 2022-05-03 MED ORDER — SODIUM CHLORIDE 0.9 % IV SOLN
2.0000 g | Freq: Once | INTRAVENOUS | Status: AC
  Administered 2022-05-03: 2 g via INTRAVENOUS
  Filled 2022-05-03: qty 20

## 2022-05-03 MED ORDER — MORPHINE SULFATE (PF) 2 MG/ML IV SOLN
2.0000 mg | INTRAVENOUS | Status: DC | PRN
  Administered 2022-05-03 – 2022-05-08 (×4): 2 mg via INTRAVENOUS
  Filled 2022-05-03 (×4): qty 1

## 2022-05-03 MED ORDER — HYDRALAZINE HCL 20 MG/ML IJ SOLN: 10.0000 mg | INTRAMUSCULAR | Status: DC | PRN

## 2022-05-03 MED ORDER — DULOXETINE HCL 30 MG PO CPEP
60.0000 mg | ORAL_CAPSULE | Freq: Every day | ORAL | Status: DC
  Administered 2022-05-03 – 2022-05-13 (×11): 60 mg via ORAL
  Filled 2022-05-03 (×5): qty 2
  Filled 2022-05-03: qty 1
  Filled 2022-05-03 (×5): qty 2

## 2022-05-03 MED ORDER — AMIODARONE HCL 200 MG PO TABS: 400.0000 mg | ORAL_TABLET | Freq: Two times a day (BID) | ORAL | Status: DC

## 2022-05-03 MED ORDER — FENTANYL CITRATE PF 50 MCG/ML IJ SOSY
50.0000 ug | PREFILLED_SYRINGE | Freq: Once | INTRAMUSCULAR | Status: AC
  Administered 2022-05-03: 50 ug via INTRAVENOUS
  Filled 2022-05-03: qty 1

## 2022-05-03 MED ORDER — ACETAMINOPHEN 500 MG PO TABS
1000.0000 mg | ORAL_TABLET | Freq: Once | ORAL | Status: AC
  Administered 2022-05-03: 1000 mg via ORAL
  Filled 2022-05-03: qty 2

## 2022-05-03 MED ORDER — MIRTAZAPINE 15 MG PO TABS: 15.0000 mg | ORAL_TABLET | Freq: Every day | ORAL | Status: DC

## 2022-05-03 MED ORDER — BISACODYL 5 MG PO TBEC: 5.0000 mg | DELAYED_RELEASE_TABLET | Freq: Every day | ORAL | Status: DC | PRN

## 2022-05-03 MED ORDER — FUROSEMIDE 20 MG PO TABS
20.0000 mg | ORAL_TABLET | Freq: Every day | ORAL | Status: DC
  Administered 2022-05-03 – 2022-05-07 (×5): 20 mg via ORAL
  Filled 2022-05-03 (×6): qty 1

## 2022-05-03 MED ORDER — ALBUTEROL SULFATE HFA 108 (90 BASE) MCG/ACT IN AERS: 1.0000 | INHALATION_SPRAY | RESPIRATORY_TRACT | Status: DC | PRN

## 2022-05-03 MED ORDER — METOPROLOL TARTRATE 25 MG PO TABS: 50.0000 mg | ORAL_TABLET | Freq: Two times a day (BID) | ORAL | Status: DC

## 2022-05-03 MED ORDER — POLYETHYLENE GLYCOL 3350 17 G PO PACK: 17.0000 g | PACK | Freq: Every day | ORAL | Status: DC | PRN

## 2022-05-03 MED ORDER — HYDROCODONE-ACETAMINOPHEN 5-325 MG PO TABS
1.0000 | ORAL_TABLET | ORAL | Status: DC | PRN
  Administered 2022-05-03 – 2022-05-12 (×21): 1 via ORAL
  Filled 2022-05-03 (×21): qty 1

## 2022-05-03 MED ORDER — IPRATROPIUM-ALBUTEROL 0.5-2.5 (3) MG/3ML IN SOLN: 3.0000 mL | Freq: Four times a day (QID) | RESPIRATORY_TRACT | Status: DC | PRN

## 2022-05-03 MED ORDER — ALBUTEROL SULFATE (2.5 MG/3ML) 0.083% IN NEBU: 2.5000 mg | INHALATION_SOLUTION | RESPIRATORY_TRACT | Status: DC | PRN

## 2022-05-03 MED ORDER — ACETAMINOPHEN 650 MG RE SUPP: 650.0000 mg | Freq: Four times a day (QID) | RECTAL | Status: DC | PRN

## 2022-05-03 MED ORDER — ACETAMINOPHEN 325 MG PO TABS
650.0000 mg | ORAL_TABLET | Freq: Four times a day (QID) | ORAL | Status: DC | PRN
  Administered 2022-05-03 – 2022-05-13 (×3): 650 mg via ORAL
  Filled 2022-05-03 (×3): qty 2

## 2022-05-03 MED ORDER — POTASSIUM CHLORIDE CRYS ER 20 MEQ PO TBCR
40.0000 meq | EXTENDED_RELEASE_TABLET | Freq: Once | ORAL | Status: AC
  Administered 2022-05-03: 40 meq via ORAL
  Filled 2022-05-03: qty 2
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
## 2022-05-03 NOTE — ED Notes (Signed)
ED Provider at bedside. 

## 2022-05-03 NOTE — Assessment & Plan Note (Signed)
-   Serum potassium 3.2. - Replace with oral potassium 40 mEq x 1 dose.  Check serum magnesium level. - Recheck BMP in AM.

## 2022-05-03 NOTE — Assessment & Plan Note (Addendum)
Worse after infection.  Patient uses cane, walker as well as wheelchair for ambulation.  PT and OT recommending skilled nursing.

## 2022-05-03 NOTE — Assessment & Plan Note (Addendum)
-   Patient has history of COPD/asthma.  Not sure if he uses home oxygen. - Not in any exacerbation.

## 2022-05-03 NOTE — Assessment & Plan Note (Addendum)
Creatinine remained stable at baseline

## 2022-05-03 NOTE — ED Provider Notes (Signed)
-----------------------------------------   9:59 AM on 05/03/2022 -----------------------------------------  I took over care on this patient from Dr. Tamala Julian.  Urinalysis confirms findings consistent with possible UTI/pyelonephritis although passed stone is also possible.  Given the patient's generalized weakness he will need admission.  I consulted Dr. Dwyane Dee from the hospitalist service; based on our discussion he agrees to admit the patient.   Arta Silence, MD 05/03/22 1000
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
## 2022-05-03 NOTE — ED Notes (Signed)
Patient returned from CT and placed in 19 hall.

## 2022-05-03 NOTE — Assessment & Plan Note (Addendum)
CT suggest pyelonephritis.  Treated with 7 days of IV Rocephin.

## 2022-05-03 NOTE — Assessment & Plan Note (Signed)
-   Patient has chronic A-fib. -Initial heart rate in the ED 70. -Patient currently on amiodarone, Toprol-XL as well as Lopressor.  I will wait for patient's medication to be reconciled and closely monitor his vitals on telemetry. -Continue Eliquis for anticoagulation.
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
## 2022-05-03 NOTE — Assessment & Plan Note (Addendum)
Acute delirium causes secondary to urinary tract infection/pyelonephritis.  These have been since resolved and he is back to his baseline.  PT and OT recommending short-term skilled nursing.

## 2022-05-03 NOTE — Assessment & Plan Note (Addendum)
-   Patient has history of chronic systolic heart failure.  His recent 2D echocardiogram dated 10/18/2021 showed left ventricular EF of 35 to 40%. -Patient not in CHF exacerbation.  BNP checked and essentially normal. -Continue his home medication.  Strict intake and output and daily weight check.  2 g sodium and 1500 cc fluid restricted diet.

## 2022-05-03 NOTE — ED Triage Notes (Addendum)
Pt arrived via [facility] from home with c/o worsening dementia, pt also c/o leg pain, wife called EMS to evaluate his dementia. Pt has been unsteady on feet and wife unable to care for him at this time.

## 2022-05-04 DIAGNOSIS — N184 Chronic kidney disease, stage 4 (severe): Secondary | ICD-10-CM | POA: Diagnosis not present

## 2022-05-04 DIAGNOSIS — N10 Acute tubulo-interstitial nephritis: Secondary | ICD-10-CM | POA: Diagnosis not present

## 2022-05-04 DIAGNOSIS — G9341 Metabolic encephalopathy: Secondary | ICD-10-CM | POA: Diagnosis not present

## 2022-05-04 LAB — CBC
HCT: 37.3 % — ABNORMAL LOW (ref 39.0–52.0)
Hemoglobin: 11.9 g/dL — ABNORMAL LOW (ref 13.0–17.0)
MCH: 30.1 pg (ref 26.0–34.0)
MCHC: 31.9 g/dL (ref 30.0–36.0)
MCV: 94.4 fL (ref 80.0–100.0)
Platelets: 214 10*3/uL (ref 150–400)
RBC: 3.95 MIL/uL — ABNORMAL LOW (ref 4.22–5.81)
RDW: 14.6 % (ref 11.5–15.5)
WBC: 6 10*3/uL (ref 4.0–10.5)
nRBC: 0 % (ref 0.0–0.2)

## 2022-05-04 LAB — BASIC METABOLIC PANEL
Anion gap: 5 (ref 5–15)
BUN: 21 mg/dL (ref 8–23)
CO2: 28 mmol/L (ref 22–32)
Calcium: 8.8 mg/dL — ABNORMAL LOW (ref 8.9–10.3)
Chloride: 101 mmol/L (ref 98–111)
Creatinine, Ser: 2.87 mg/dL — ABNORMAL HIGH (ref 0.61–1.24)
GFR, Estimated: 23 mL/min — ABNORMAL LOW (ref >60)
Glucose, Bld: 79 mg/dL (ref 70–99)
Potassium: 3.5 mmol/L (ref 3.5–5.1)
Sodium: 134 mmol/L — ABNORMAL LOW (ref 135–145)

## 2022-05-04 MED ORDER — ORAL CARE MOUTH RINSE: 15.0000 mL | OROMUCOSAL | Status: DC | PRN

## 2022-05-04 MED ORDER — ENSURE ENLIVE PO LIQD
237.0000 mL | Freq: Three times a day (TID) | ORAL | Status: DC
  Administered 2022-05-04 – 2022-05-13 (×23): 237 mL via ORAL

## 2022-05-04 MED ORDER — PROCHLORPERAZINE EDISYLATE 10 MG/2ML IJ SOLN
10.0000 mg | Freq: Four times a day (QID) | INTRAMUSCULAR | Status: DC | PRN
  Administered 2022-05-04 – 2022-05-11 (×5): 10 mg via INTRAVENOUS
  Filled 2022-05-04 (×5): qty 2

## 2022-05-04 MED ORDER — ADULT MULTIVITAMIN W/MINERALS CH
1.0000 | ORAL_TABLET | Freq: Every day | ORAL | Status: DC
  Administered 2022-05-05 – 2022-05-11 (×7): 1 via ORAL
  Filled 2022-05-04 (×7): qty 1

## 2022-05-04 MED ORDER — ONDANSETRON HCL 4 MG/2ML IJ SOLN: 4.0000 mg | Freq: Four times a day (QID) | INTRAMUSCULAR | Status: DC | PRN

## 2022-05-04 MED ORDER — VITAMIN C 500 MG PO TABS
500.0000 mg | ORAL_TABLET | Freq: Two times a day (BID) | ORAL | Status: DC
  Administered 2022-05-04 – 2022-05-13 (×18): 500 mg via ORAL
  Filled 2022-05-04 (×18): qty 1
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
## 2022-05-04 NOTE — NC FL2 (Signed)
[place] LEVEL OF CARE SCREENING TOOL     IDENTIFICATION  Patient Name: Matthew [name] Birthdate: [birth year] Sex: male Admission Date (Current Location): 05/03/2022  [facility] [place] Number:  Engineering geologist and Address:         Provider Number: [id number]  Attending Physician Name and Address:  Wyvonnia Dusky, MD  Relative Name and Phone Number:       Current Level of Care: Hospital Recommended Level of Care: [place] Prior Approval Number:    Date Approved/Denied:   PASRR Number: [id number]  Discharge Plan: SNF    Current Diagnoses: Patient Active Problem List   Diagnosis Date Noted   Acute pyelonephritis 47/82/9562   Chronic systolic CHF (congestive heart failure) ([place]) 13/03/6577   Acute metabolic encephalopathy 46/96/2952   Atrial fibrillation, chronic ([place]) 05/03/2022   Hypokalemia 05/03/2022   Persistent atrial fibrillation (HCC)    NICM (nonischemic cardiomyopathy) ([place])    Rapid atrial fibrillation ([place]) 10/17/2021   Obesity, Class III, BMI 40-49.9 (morbid obesity) ([place]) 10/17/2021   OSA (obstructive sleep apnea) 10/17/2021   Anemia due to vitamin B12 deficiency    Delirium    Hyperkalemia    Heart failure with preserved ejection fraction (HCC)    CKD (chronic kidney disease) stage 4, GFR 15-29 ml/min (HCC)    Vitamin B12 deficiency    Depression    Atrial fibrillation with rapid ventricular response ([place]) 07/11/2021   Anemia of chronic disease 07/11/2021   Asthma exacerbation 07/03/2021   Chest pain 07/03/2021   Acute gout    Acute on chronic diastolic CHF (congestive heart failure) (HCC)    Right leg pain    Weakness    SOB (shortness of breath) 06/12/2021   Bilateral leg edema 06/12/2021   Atrial fibrillation with RVR ([place]) 06/12/2021   AKI (acute kidney injury) ([place]) 06/12/2021   Elevated glucose level 06/12/2021   Obesity (BMI 30-39.9) 06/12/2021   Macrocytosis 06/12/2021   Acute on chronic  respiratory failure with hypoxia ([place]) 06/12/2021   Chronic pain of left knee 12/16/2020   COPD (chronic obstructive pulmonary disease) ([place]) 12/04/2020   Acute right hip pain 12/04/2020   H/O bilateral hip replacements 12/04/2020   Stage 3b chronic kidney disease ([place]) 12/04/2020   Displaced comminuted fracture of shaft of left femur, initial encounter for closed fracture ([place]) 05/19/2020   Asthma 05/18/2020   Hypertension 05/18/2020   Femur fracture, left ([place]) 05/18/2020   Closed comminuted intra-articular fracture of distal femur ([place]) 05/17/2020   Moderate persistent asthma with exacerbation 10/15/2018    Orientation RESPIRATION BLADDER Height & Weight     Self, Situation, Place  O2 (2L [place]) Continent Weight: 105.2 kg Height:  '6\' 2"'$  (188 cm)  BEHAVIORAL SYMPTOMS/MOOD NEUROLOGICAL BOWEL NUTRITION STATUS      Continent Diet (Heart Healthy)  AMBULATORY STATUS COMMUNICATION OF NEEDS Skin   Extensive Assist Verbally Skin abrasions                       Personal Care Assistance Level of Assistance              Functional Limitations Info             SPECIAL CARE FACTORS FREQUENCY  PT (By licensed PT), OT (By licensed OT)                    Contractures Contractures Info: Not present    Additional Factors Info  Code Status, Allergies Code Status Info: Full Allergies Info: Demerol (Meperidine Hcl), Lisinopril, Meperidine And Related, Talwin (Pentazocine)           Current Medications (05/04/2022):  This is the current hospital active medication list Current Facility-Administered Medications  Medication Dose Route Frequency Provider Last Rate Last Admin   acetaminophen (TYLENOL) tablet 650 mg  650 mg Oral Q6H PRN Caryn Bee, MD   650 mg at 05/03/22 2035   Or   acetaminophen (TYLENOL) suppository 650 mg  650 mg Rectal Q6H PRN Caryn Bee, MD       apixaban Arne [place]) tablet 5 mg  5 mg Oral BID Caryn Bee, MD   5 mg  at 05/03/22 2035   bisacodyl (DULCOLAX) EC tablet 5 mg  5 mg Oral Daily PRN Caryn Bee, MD       cefTRIAXone (ROCEPHIN) 1 g in sodium chloride 0.9 % 100 mL IVPB  1 g Intravenous Q24H Caryn Bee, MD 200 mL/hr at 05/04/22 0647 1 g at 05/04/22 0647   DULoxetine (CYMBALTA) DR capsule 60 mg  60 mg Oral Daily Caryn Bee, MD   60 mg at 05/03/22 1153   furosemide (LASIX) tablet 20 mg  20 mg Oral Daily Caryn Bee, MD   20 mg at 05/03/22 1145   hydrALAZINE (APRESOLINE) injection 10 mg  10 mg Intravenous Q4H PRN Caryn Bee, MD       HYDROcodone-acetaminophen (NORCO/VICODIN) 5-325 MG per tablet 1 tablet  1 tablet Oral Q4H PRN Caryn Bee, MD   1 tablet at 05/04/22 0351   morphine (PF) 2 MG/ML injection 2 mg  2 mg Intravenous Q3H PRN Caryn Bee, MD   2 mg at 05/04/22 0054   Oral care mouth rinse  15 mL Mouth Rinse PRN Caryn Bee, MD       polyethylene glycol (MIRALAX / GLYCOLAX) packet 17 g  17 g Oral Daily PRN Caryn Bee, MD       prochlorperazine (COMPAZINE) injection 10 mg  10 mg Intravenous Q6H PRN Wyvonnia Dusky, MD   10 mg at 05/04/22 0930   traZODone (DESYREL) tablet 25 mg  25 mg Oral QHS PRN Caryn Bee, MD   25 mg at 05/03/22 2035     Discharge Medications: Please see discharge summary for a list of discharge medications.  Relevant Imaging Results:  Relevant Lab Results:   Additional Information SPQ:[id number]  Beverly Sessions, RN

## 2022-05-04 NOTE — Progress Notes (Addendum)
Initial Nutrition Assessment  DOCUMENTATION CODES:   Non-severe (moderate) malnutrition in context of chronic illness  INTERVENTION:   Ensure Enlive po TID, each supplement provides 350 kcal and 20 grams of protein.  MVI po daily  Vitamin C 538m po BID  Liberalize diet   Pt at high refeed risk; recommend monitor potassium, magnesium and phosphorus labs daily until stable  Check vitamin D lab  NUTRITION DIAGNOSIS:   Moderate Malnutrition related to chronic illness as evidenced by mild fat depletions, moderate muscle depletions and 18 percent weight loss in 6 months.  GOAL:   Patient will meet greater than or equal to 90% of their needs  MONITOR:   PO intake, Supplement acceptance, Labs, Weight trends, Skin, I & O's  REASON FOR ASSESSMENT:   Malnutrition Screening Tool    ASSESSMENT:   71y/o male with h/o mild dementia, COPD, CHF, OSA, CKD IV, Afib, gout, hernia s/p repair, depression, OSA and CAD who is admitted with acute pyelonephritis.  Met with pt in room today. Pt reports decreased appetite and oral intake for the past 6 months along with a 50lb weight loss. Per chart, pt is down 52lbs(18%) over the past 6 months; this is severe weight loss. Pt documented to be eating 100% of meals in hospital. RD discussed with pt the importance of adequate nutrition needed to preserve lean muscle and to support wound healing. Pt is willing to try vanilla Ensure in hospital. RD will add supplements and vitamins to help pt meet his estimated needs. RD will also liberalize pt's diet. Pt is at high refeed risk. Pt with h/o vitamin D deficiency. Pt reports weakness pta; will check vitamin D level.   Medications reviewed and include: lasix, ceftriaxone  Labs reviewed: Na 134(L), K 3.5 wnl, Mg 2.3 wnl  NUTRITION - FOCUSED PHYSICAL EXAM:  Flowsheet Row Most Recent Value  Orbital Region No depletion  Upper Arm Region No depletion  Thoracic and Lumbar Region Mild depletion  Buccal  Region No depletion  Temple Region Moderate depletion  Clavicle Bone Region Moderate depletion  Clavicle and Acromion Bone Region Moderate depletion  Scapular Bone Region Mild depletion  Dorsal Hand Moderate depletion  Patellar Region No depletion  Anterior Thigh Region No depletion  Posterior Calf Region No depletion  Edema (RD Assessment) Mild  Hair Reviewed  Eyes Reviewed  Mouth Reviewed  Skin Reviewed  [ecchymosis]  Nails Reviewed   Diet Order:   Diet Order             Diet regular Room service appropriate? Yes; Fluid consistency: Thin  Diet effective now                  EDUCATION NEEDS:   Education needs have been addressed  Skin:  Skin Assessment: Reviewed RN Assessment (lumbar wounds)  Last BM:  pta  Height:   Ht Readings from Last 1 Encounters:  05/03/22 6' 2"  (1.88 m)    Weight:   Wt Readings from Last 1 Encounters:  05/04/22 106.5 kg    Ideal Body Weight:  86.36 kg  BMI:  Body mass index is 30.13 kg/m.  Estimated Nutritional Needs:   Kcal:  2300-2600kcal/day  Protein:  115-130g/day  Fluid:  2.0L/day  CKoleen DistanceMS, RD, LDN Please refer to [facility] Center[facility] RD and/or RD on-call/weekend/after hours pager

## 2022-05-04 NOTE — TOC Initial Note (Signed)
Transition of Care [facility]) - Initial/Assessment Note    Patient Details  Name: Matthew [name] MRN: [id number] Date of Birth: 16-Jan-1951  Transition of [facility] CM/SW Contact:    Beverly Sessions, RN Phone Number: 05/04/2022, 10:15 AM  Clinical Narrative:                    Admitted for: pyelonephritis    Admitted from: Home with wife [id number] Current home health/prior home health/DME: Bed, BSC, cane, RW.  [place] in the past  Patient with history of dementia.  Attempted to call wife however VM was not set up, wife not at bedside.   Per chart review MD states that wife is having more difficult managing him at home due to dementia.  Therapy recommending SNF Existing PASSR Fl2 sent for signature Bed search initiated        Patient Goals and CMS Choice        Expected Discharge Plan and Services                                                Prior Living Arrangements/Services                       Activities of Daily Living      Permission Sought/Granted                  Emotional Assessment              Admission diagnosis:  Hypokalemia [E87.6] Prolonged Q-T interval on ECG [R94.31] Left flank pain [R10.9] Generalized weakness [R53.1] Acute pyelonephritis [N10] Patient Active Problem List   Diagnosis Date Noted   Acute pyelonephritis 08/22/3233   Chronic systolic CHF (congestive heart failure) ([place]) 57/32/2025   Acute metabolic encephalopathy 42/70/6237   Atrial fibrillation, chronic ([place]) 05/03/2022   Hypokalemia 05/03/2022   Persistent atrial fibrillation (HCC)    NICM (nonischemic cardiomyopathy) ([place])    Rapid atrial fibrillation ([place]) 10/17/2021   Obesity, Class III, BMI 40-49.9 (morbid obesity) ([place]) 10/17/2021   OSA (obstructive sleep apnea) 10/17/2021   Anemia due to vitamin B12 deficiency    Delirium    Hyperkalemia    Heart failure with preserved ejection fraction (HCC)    CKD (chronic kidney disease) stage  4, GFR 15-29 ml/min (HCC)    Vitamin B12 deficiency    Depression    Atrial fibrillation with rapid ventricular response ([place]) 07/11/2021   Anemia of chronic disease 07/11/2021   Asthma exacerbation 07/03/2021   Chest pain 07/03/2021   Acute gout    Acute on chronic diastolic CHF (congestive heart failure) (HCC)    Right leg pain    Weakness    SOB (shortness of breath) 06/12/2021   Bilateral leg edema 06/12/2021   Atrial fibrillation with RVR ([place]) 06/12/2021   AKI (acute kidney injury) ([place]) 06/12/2021   Elevated glucose level 06/12/2021   Obesity (BMI 30-39.9) 06/12/2021   Macrocytosis 06/12/2021   Acute on chronic respiratory failure with hypoxia ([place]) 06/12/2021   Chronic pain of left knee 12/16/2020   COPD (chronic obstructive pulmonary disease) ([place]) 12/04/2020   Acute right hip pain 12/04/2020   H/O bilateral hip replacements 12/04/2020   Stage 3b chronic kidney disease ([place]) 12/04/2020   Displaced comminuted fracture of shaft of left femur, initial encounter for closed fracture (  [place]) 05/19/2020   Asthma 05/18/2020   Hypertension 05/18/2020   Femur fracture, left ([place]) 05/18/2020   Closed comminuted intra-articular fracture of distal femur ([place]) 05/17/2020   Moderate persistent asthma with exacerbation 10/15/2018   PCP:  [name], MD Pharmacy:   [facility] [place] [place] [address] [facility] [address] [place] [address] Phone: [phone] Fax: [phone]  [id number] [id number]: [phone]: [phone]    Social Determinants of Health (SDOH) Interventions    Readmission Risk Interventions    10/18/2021   12:39 PM  Readmission Risk Prevention Plan  Transportation Screening Complete  Medication Review (RP[place] Complete  PCP or Specialist appointment within 3-5 days of discharge Complete  HRI or HMartinComplete  SW Recovery Care/Counseling Consult Complete  PBolivarNot Applicable
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
## 2022-05-05 DIAGNOSIS — N10 Acute tubulo-interstitial nephritis: Secondary | ICD-10-CM | POA: Diagnosis not present

## 2022-05-05 DIAGNOSIS — E440 Moderate protein-calorie malnutrition: Secondary | ICD-10-CM | POA: Insufficient documentation

## 2022-05-05 DIAGNOSIS — R531 Weakness: Secondary | ICD-10-CM

## 2022-05-05 DIAGNOSIS — I482 Chronic atrial fibrillation, unspecified: Secondary | ICD-10-CM | POA: Diagnosis not present

## 2022-05-05 LAB — PHOSPHORUS: Phosphorus: 2.8 mg/dL (ref 2.5–4.6)

## 2022-05-05 LAB — CBC
HCT: 34 % — ABNORMAL LOW (ref 39.0–52.0)
Hemoglobin: 10.5 g/dL — ABNORMAL LOW (ref 13.0–17.0)
MCH: 29.6 pg (ref 26.0–34.0)
MCHC: 30.9 g/dL (ref 30.0–36.0)
MCV: 95.8 fL (ref 80.0–100.0)
Platelets: 241 10*3/uL (ref 150–400)
RBC: 3.55 MIL/uL — ABNORMAL LOW (ref 4.22–5.81)
RDW: 14.7 % (ref 11.5–15.5)
WBC: 4.6 10*3/uL (ref 4.0–10.5)
nRBC: 0 % (ref 0.0–0.2)

## 2022-05-05 LAB — BASIC METABOLIC PANEL
Anion gap: 8 (ref 5–15)
BUN: 23 mg/dL (ref 8–23)
CO2: 26 mmol/L (ref 22–32)
Calcium: 8.9 mg/dL (ref 8.9–10.3)
Chloride: 99 mmol/L (ref 98–111)
Creatinine, Ser: 2.79 mg/dL — ABNORMAL HIGH (ref 0.61–1.24)
GFR, Estimated: 23 mL/min — ABNORMAL LOW (ref >60)
Glucose, Bld: 94 mg/dL (ref 70–99)
Potassium: 4.1 mmol/L (ref 3.5–5.1)
Sodium: 133 mmol/L — ABNORMAL LOW (ref 135–145)

## 2022-05-05 LAB — VITAMIN D 25 HYDROXY (VIT D DEFICIENCY, FRACTURES): Vit D, 25-Hydroxy: 22.44 ng/mL — ABNORMAL LOW (ref 30–100)

## 2022-05-05 LAB — MAGNESIUM: Magnesium: 2.3 mg/dL (ref 1.7–2.4)

## 2022-05-05 MED ORDER — METHYLPREDNISOLONE SODIUM SUCC 125 MG IJ SOLR
60.0000 mg | Freq: Once | INTRAMUSCULAR | Status: AC
  Administered 2022-05-06: 60 mg via INTRAVENOUS
  Filled 2022-05-05: qty 2

## 2022-05-05 MED ORDER — VITAMIN D 25 MCG (1000 UNIT) PO TABS
2000.0000 [IU] | ORAL_TABLET | Freq: Every day | ORAL | Status: DC
  Administered 2022-05-06 – 2022-05-13 (×8): 2000 [IU] via ORAL
  Filled 2022-05-05 (×8): qty 2

## 2022-05-05 MED ORDER — COLCHICINE 0.6 MG PO TABS
0.6000 mg | ORAL_TABLET | Freq: Two times a day (BID) | ORAL | Status: DC | PRN
  Administered 2022-05-06 – 2022-05-09 (×5): 0.6 mg via ORAL
  Filled 2022-05-05 (×5): qty 1

## 2022-05-05 NOTE — TOC Progression Note (Signed)
Transition of Care [facility]) - Progression Note    Patient Details  Name: Matthew [name] MRN: [id number] Date of Birth: 12-Feb-1951  [facility] CM/SW Contact  Beverly Sessions, RN Phone Number: 05/05/2022, 4:29 PM  Clinical Narrative:      Bed offers available.  Attempted to call wife again.  No VM set up No other numbers listed on chart.  Spoke with bedside nurse and confirmed no visitors have been present the last 2 days  [employer] for [employer] check on wife      Expected Discharge Plan and Services                                                 Social Determinants of Health (SDOH) Interventions    Readmission Risk Interventions    05/04/2022   10:42 AM 10/18/2021   12:39 PM  Readmission Risk Prevention Plan  Transportation Screening  Complete  HRI or Home Care Consult Complete   Palliative Care Screening Complete   Medication Review ([place]) Complete Complete  PCP or Specialist appointment within 3-5 days of discharge  Complete  HRI or [place]  Complete  SW Recovery Care/Counseling Consult  Complete  [place]  Not Applicable

## 2022-05-05 NOTE — Care Management Important Message (Signed)
Important Message  Patient Details  Name: Matthew [name] MRN: [id number] Date of Birth: [birth year]   Medicare Important Message Given:  N/A - LOS <3 / Initial given by admissions     Dannette Barbara 05/05/2022, 11:18 AM

## 2022-05-05 NOTE — Plan of Care (Signed)

## 2022-05-05 NOTE — Progress Notes (Addendum)
PROGRESS NOTE    Matthew [name]  TKZ:[id number] DOB: Nov 10, 1950 DOA: 05/03/2022 PCP: [name], MD    Assessment & Plan:   Principal Problem:   Acute pyelonephritis Active Problems:   Acute metabolic encephalopathy   Weakness   Hypokalemia   CKD (chronic kidney disease) stage 4, GFR 15-29 ml/min (HCC)   COPD (chronic obstructive pulmonary disease) (HCC)   Chronic systolic CHF (congestive heart failure) (HCC)   Atrial fibrillation, chronic (HCC)   Malnutrition of moderate degree  Assessment and Plan: Acute pyelonephritis: abxs were given before urine cx was taken. Urine cx is growing e. oli, sens pending. Continue on IV rocpehin. Norco, morphine prn for pain    Acute metabolic encephalopathy: w/ hx of dementia. Possibly secondary to above infection. Mental status improved today. Continue on IV abxs    Weakness: OT/PT recs SNF. Pt wants to discuss w/ his wife first    Hypokalemia: WNL today    CKDIV: Cr is trending down daily. Avoid nephrotoxic meds   ACD: likely secondary to CKD. No need for a transfusion currently    COPD: w/o exacerbation. Continue on bronchodilators & encourage incentive spirometry     Chronic systolic CHF: echo 12/19/3218 showed EF of 35 to 40%. Continue on lasix. Monitor I/Os.   Chronic a. fib: continue on eliquis. Holding home dose of metoprolol, amiodarone as pt is intermittently bradycardic         DVT prophylaxis: eliquis  Code Status: full  Family Communication: called pt's wife, [name], but there was no answer & I was unable to leave a voicemail  Disposition Plan: possibly d/c to SNF  Level of care: Med-Surg  Status is: Inpatient Remains inpatient appropriate because: severity of illness   Consultants:    Procedures:   Antimicrobials: rocephin   Subjective: Pt c/o fatigue   Objective: Vitals:   05/04/22 1657 05/04/22 2007 05/05/22 0321 05/05/22 0802  BP: 108/68 102/64 121/68 113/71  Pulse: (!) 45 (!) 46 (!) 50 (!) 59   Resp: '16 20 16 18  '$ Temp: (!) 97.4 F (36.3 C) 97.7 F (36.5 C) 97.6 F (36.4 C) 97.9 F (36.6 C)  TempSrc:  Oral Oral Oral  SpO2: 100% 93% 100% 96%  Weight:      Height:        Intake/Output Summary (Last 24 hours) at 05/05/2022 0810 Last data filed at 05/05/2022 0630 Gross per 24 hour  Intake 290 ml  Output 800 ml  Net -510 ml   Filed Weights   05/03/22 0409 05/03/22 1935 05/04/22 1507  Weight: 113.4 kg 105.2 kg 106.5 kg    Examination:  General exam: Appears calm & comfortable  Respiratory system: clear breath sounds b/l  Cardiovascular system: bradycardia. No rubs or gallops  Gastrointestinal system: Abd is soft, NT, obese & normal bowel sounds  Central nervous system: Alert and oriented. Moves all extremities  Psychiatry: judgement and insight appears at baseline. Flat mood and affect     Data Reviewed: I have personally reviewed following labs and imaging studies  CBC: Recent Labs  Lab 05/03/22 0409 05/04/22 0620 05/05/22 0520  WBC 9.9 6.0 4.6  HGB 12.0* 11.9* 10.5*  HCT 39.2 37.3* 34.0*  MCV 97.3 94.4 95.8  PLT 340 214 254   Basic Metabolic Panel: Recent Labs  Lab 05/03/22 0409 05/04/22 0824 05/05/22 0520  NA 134* 134* 133*  K 3.2* 3.5 4.1  CL 99 101 99  CO2 '25 28 26  '$ GLUCOSE 122* 79 94  BUN  $'21 21 23  'U$ CREATININE 2.97* 2.87* 2.79*  CALCIUM 9.5 8.8* 8.9  MG 2.3  --  2.3  PHOS  --   --  2.8   GFR: Estimated Creatinine Clearance: 31.1 mL/min (A) (by C-G formula based on SCr of 2.79 mg/dL (H)). Liver Function Tests: Recent Labs  Lab 05/03/22 0409  AST 18  ALT 8  ALKPHOS 102  BILITOT 0.8  PROT 7.0  ALBUMIN 2.6*   No results for input(s): "LIPASE", "AMYLASE" in the last 168 hours. No results for input(s): "AMMONIA" in the last 168 hours. Coagulation Profile: No results for input(s): "INR", "PROTIME" in the last 168 hours. Cardiac Enzymes: No results for input(s): "CKTOTAL", "CKMB", "CKMBINDEX", "TROPONINI" in the last 168 hours. BNP  (last 3 results) No results for input(s): "PROBNP" in the last 8760 hours. HbA1C: No results for input(s): "HGBA1C" in the last 72 hours. CBG: No results for input(s): "GLUCAP" in the last 168 hours. Lipid Profile: No results for input(s): "CHOL", "HDL", "LDLCALC", "TRIG", "CHOLHDL", "LDLDIRECT" in the last 72 hours. Thyroid Function Tests: No results for input(s): "TSH", "T4TOTAL", "FREET4", "T3FREE", "THYROIDAB" in the last 72 hours. Anemia Panel: No results for input(s): "VITAMINB12", "FOLATE", "FERRITIN", "TIBC", "IRON", "RETICCTPCT" in the last 72 hours. Sepsis Labs: No results for input(s): "PROCALCITON", "LATICACIDVEN" in the last 168 hours.  Recent Results (from the past 240 hour(s))  SARS Coronavirus 2 by RT PCR (hospital order, performed in [facility] hospital lab) *cepheid single result test* Anterior Nasal Swab     Status: None   Collection Time: 05/03/22  4:09 AM   Specimen: Anterior Nasal Swab  Result Value Ref Range Status   SARS Coronavirus 2 by RT PCR NEGATIVE NEGATIVE Final    Comment: (NOTE) SARS-CoV-2 target nucleic acids are NOT DETECTED.  The SARS-CoV-2 RNA is generally detectable in upper and lower respiratory specimens during the acute phase of infection. The lowest concentration of SARS-CoV-2 viral copies this assay can detect is 250 copies / mL. A negative result does not preclude SARS-CoV-2 infection and should not be used as the sole basis for treatment or other patient management decisions.  A negative result may occur with improper specimen collection / handling, submission of specimen other than nasopharyngeal swab, presence of viral mutation(s) within the areas targeted by this assay, and inadequate number of viral copies (<250 copies / mL). A negative result must be combined with clinical observations, patient history, and epidemiological information.  Fact Sheet for Patients:   https://www.patel.info/  Fact Sheet for  Healthcare Providers: https://hall.com/  This test is not yet approved or  cleared by the [place] FDA and has been authorized for detection and/or diagnosis of SARS-CoV-2 by FDA under an Emergency Use Authorization (EUA).  This EUA will remain in effect (meaning this test can be used) for the duration of the COVID-19 declaration under Section 564(b)(1) of the Act, 21 U.S.C. section 360bbb-3(b)(1), unless the authorization is terminated or revoked sooner.  Performed at [facility], [address], [place], [place] [address]          Radiology Studies: No results found.      Scheduled Meds:  apixaban  5 mg Oral BID   vitamin C  500 mg Oral BID   DULoxetine  60 mg Oral Daily   feeding supplement  237 mL Oral TID BM   furosemide  20 mg Oral Daily   multivitamin with minerals  1 tablet Oral Daily   Continuous Infusions:  cefTRIAXone (ROCEPHIN)  IV 1 g (05/05/22 0630)     LOS: 2 days    Time spent: 36 mins     Wyvonnia Dusky, MD [facility] Pager [phone]  If 7PM-7AM, please contact night-coverage [email or web address] 05/05/2022, 8:10 AM

## 2022-05-05 NOTE — TOC Progression Note (Signed)
Transition of Care [facility]) - Progression Note    Patient Details  Name: Matthew [name] MRN: [id number] Date of Birth: 1950/08/16  Transition of Care [facility]) CM/SW [place], LCSW Phone Number: 05/05/2022, 4:46 PM  Clinical Narrative:    Call from Deputy who stated he checked on patient's wife at home and she is ok, stated she was sleeping. CSW requested patient's wife call CSW so bed offers can be presented and DC plan can be discussed. Deputy to relay this request.         Expected Discharge Plan and Services                                                 Social Determinants of Health (SDOH) Interventions    Readmission Risk Interventions    05/04/2022   10:42 AM 10/18/2021   12:39 PM  Readmission Risk Prevention Plan  Transportation Screening  Complete  HRI or Home Care Consult Complete   Palliative Care Screening Complete   Medication Review ([place]) Complete Complete  PCP or Specialist appointment within 3-5 days of discharge  Complete  HRI or [place]  Complete  SW Recovery Care/Counseling Consult  Complete  [place]  Not Applicable

## 2022-05-06 DIAGNOSIS — R531 Weakness: Secondary | ICD-10-CM | POA: Diagnosis not present

## 2022-05-06 DIAGNOSIS — G9341 Metabolic encephalopathy: Secondary | ICD-10-CM | POA: Diagnosis not present

## 2022-05-06 DIAGNOSIS — N10 Acute tubulo-interstitial nephritis: Secondary | ICD-10-CM | POA: Diagnosis not present

## 2022-05-06 LAB — URINE CULTURE: Culture: >=100000 — AB

## 2022-05-06 LAB — BASIC METABOLIC PANEL
Anion gap: 8 (ref 5–15)
BUN: 36 mg/dL — ABNORMAL HIGH (ref 8–23)
CO2: 24 mmol/L (ref 22–32)
Calcium: 9 mg/dL (ref 8.9–10.3)
Chloride: 99 mmol/L (ref 98–111)
Creatinine, Ser: 2.5 mg/dL — ABNORMAL HIGH (ref 0.61–1.24)
GFR, Estimated: 27 mL/min — ABNORMAL LOW (ref >60)
Glucose, Bld: 115 mg/dL — ABNORMAL HIGH (ref 70–99)
Potassium: 4.6 mmol/L (ref 3.5–5.1)
Sodium: 131 mmol/L — ABNORMAL LOW (ref 135–145)

## 2022-05-06 LAB — CBC
HCT: 30.4 % — ABNORMAL LOW (ref 39.0–52.0)
Hemoglobin: 9.7 g/dL — ABNORMAL LOW (ref 13.0–17.0)
MCH: 30.4 pg (ref 26.0–34.0)
MCHC: 31.9 g/dL (ref 30.0–36.0)
MCV: 95.3 fL (ref 80.0–100.0)
Platelets: 258 10*3/uL (ref 150–400)
RBC: 3.19 MIL/uL — ABNORMAL LOW (ref 4.22–5.81)
RDW: 15.2 % (ref 11.5–15.5)
WBC: 9.2 10*3/uL (ref 4.0–10.5)
nRBC: 0 % (ref 0.0–0.2)

## 2022-05-06 LAB — MAGNESIUM: Magnesium: 2.2 mg/dL (ref 1.7–2.4)

## 2022-05-06 LAB — PHOSPHORUS: Phosphorus: 1.7 mg/dL — ABNORMAL LOW (ref 2.5–4.6)

## 2022-05-06 NOTE — Progress Notes (Signed)
PROGRESS NOTE    Matthew [name]  ZJI:[id number] DOB: 03-18-1951 DOA: 05/03/2022 PCP: [name], MD    Assessment & Plan:   Principal Problem:   Acute pyelonephritis Active Problems:   Acute metabolic encephalopathy   Weakness   Hypokalemia   CKD (chronic kidney disease) stage 4, GFR 15-29 ml/min (HCC)   COPD (chronic obstructive pulmonary disease) (HCC)   Chronic systolic CHF (congestive heart failure) (HCC)   Atrial fibrillation, chronic (HCC)   Malnutrition of moderate degree  Assessment and Plan: Acute pyelonephritis: abxs were given before urine cx was taken. Urine cx growing e. coli. Continue on IV rocephin    Acute metabolic encephalopathy: w/ hx of dementia. Possibly secondary to above infection. Mental status is labile. Continue on IV abxs    Weakness: PT/OT recs SNF. Pt has not been able to discuss w/ his wife yet    Hypokalemia: WNL today    CKDIV: Cr is trending down daily. Avoid nephrotoxic meds   ACD: likely secondary to CKD. No need for a transfusion currently    COPD: w/o exacerbation. Continue on bronchodilators & encourage incentive spirometry    Chronic systolic CHF: echo 08/20/5100 showed EF of 35 to 40%. Continue on lasix. Monitor I/Os   Chronic a. fib: continue on eliquis. Holding home dose of amio, metoprolol secondary to intermittent bradycardia.           DVT prophylaxis: eliquis  Code Status: full  Family Communication: called pt's wife, [name], again today and no answer & unable to leave a voicemail  Disposition Plan: possibly d/c to SNF  Level of care: Med-Surg  Status is: Inpatient Remains inpatient appropriate because: severity of illness   Consultants:    Procedures:   Antimicrobials: rocephin   Subjective: Pt c/o anxiety   Objective: Vitals:   05/05/22 1604 05/05/22 2002 05/06/22 0400 05/06/22 0902  BP: (!) 160/87 122/70 (!) 141/83 136/81  Pulse: 66 64 80 85  Resp: '19 20 20 20  '$ Temp: 98 F (36.7 C) 97.6 F  (36.4 C) 98.4 F (36.9 C) 98 F (36.7 C)  TempSrc: Oral Oral  Oral  SpO2: 98% 100% 95% 96%  Weight:      Height:        Intake/Output Summary (Last 24 hours) at 05/06/2022 1305 Last data filed at 05/06/2022 0900 Gross per 24 hour  Intake 360 ml  Output 900 ml  Net -540 ml   Filed Weights   05/03/22 0409 05/03/22 1935 05/04/22 1507  Weight: 113.4 kg 105.2 kg 106.5 kg    Examination:  General exam: Appears comfortable  Respiratory system: clear breath sounds b/l. No wheezes  Cardiovascular system: S1/S2+. No rubs or gallops  Gastrointestinal system: Abd is soft, NT,obese & hypoactive bowel sounds  Central nervous system: Alert and oriented. Moves all extremities  Psychiatry: judgement and insight appears at baseline. Anxious mood and affect    Data Reviewed: I have personally reviewed following labs and imaging studies  CBC: Recent Labs  Lab 05/03/22 0409 05/04/22 0620 05/05/22 0520  WBC 9.9 6.0 4.6  HGB 12.0* 11.9* 10.5*  HCT 39.2 37.3* 34.0*  MCV 97.3 94.4 95.8  PLT 340 214 585   Basic Metabolic Panel: Recent Labs  Lab 05/03/22 0409 05/04/22 0824 05/05/22 0520  NA 134* 134* 133*  K 3.2* 3.5 4.1  CL 99 101 99  CO2 '25 28 26  '$ GLUCOSE 122* 79 94  BUN '21 21 23  '$ CREATININE 2.97* 2.87* 2.79*  CALCIUM 9.5 8.8*  8.9  MG 2.3  --  2.3  PHOS  --   --  2.8   GFR: Estimated Creatinine Clearance: 31.1 mL/min (A) (by C-G formula based on SCr of 2.79 mg/dL (H)). Liver Function Tests: Recent Labs  Lab 05/03/22 0409  AST 18  ALT 8  ALKPHOS 102  BILITOT 0.8  PROT 7.0  ALBUMIN 2.6*   No results for input(s): "LIPASE", "AMYLASE" in the last 168 hours. No results for input(s): "AMMONIA" in the last 168 hours. Coagulation Profile: No results for input(s): "INR", "PROTIME" in the last 168 hours. Cardiac Enzymes: No results for input(s): "CKTOTAL", "CKMB", "CKMBINDEX", "TROPONINI" in the last 168 hours. BNP (last 3 results) No results for input(s): "PROBNP" in  the last 8760 hours. HbA1C: No results for input(s): "HGBA1C" in the last 72 hours. CBG: No results for input(s): "GLUCAP" in the last 168 hours. Lipid Profile: No results for input(s): "CHOL", "HDL", "LDLCALC", "TRIG", "CHOLHDL", "LDLDIRECT" in the last 72 hours. Thyroid Function Tests: No results for input(s): "TSH", "T4TOTAL", "FREET4", "T3FREE", "THYROIDAB" in the last 72 hours. Anemia Panel: No results for input(s): "VITAMINB12", "FOLATE", "FERRITIN", "TIBC", "IRON", "RETICCTPCT" in the last 72 hours. Sepsis Labs: No results for input(s): "PROCALCITON", "LATICACIDVEN" in the last 168 hours.  Recent Results (from the past 240 hour(s))  SARS Coronavirus 2 by RT PCR (hospital order, performed in [facility] hospital lab) *cepheid single result test* Anterior Nasal Swab     Status: None   Collection Time: 05/03/22  4:09 AM   Specimen: Anterior Nasal Swab  Result Value Ref Range Status   SARS Coronavirus 2 by RT PCR NEGATIVE NEGATIVE Final    Comment: (NOTE) SARS-CoV-2 target nucleic acids are NOT DETECTED.  The SARS-CoV-2 RNA is generally detectable in upper and lower respiratory specimens during the acute phase of infection. The lowest concentration of SARS-CoV-2 viral copies this assay can detect is 250 copies / mL. A negative result does not preclude SARS-CoV-2 infection and should not be used as the sole basis for treatment or other patient management decisions.  A negative result may occur with improper specimen collection / handling, submission of specimen other than nasopharyngeal swab, presence of viral mutation(s) within the areas targeted by this assay, and inadequate number of viral copies (<250 copies / mL). A negative result must be combined with clinical observations, patient history, and epidemiological information.  Fact Sheet for Patients:   https://www.patel.info/  Fact Sheet for Healthcare  Providers: https://hall.com/  This test is not yet approved or  cleared by the [place] FDA and has been authorized for detection and/or diagnosis of SARS-CoV-2 by FDA under an Emergency Use Authorization (EUA).  This EUA will remain in effect (meaning this test can be used) for the duration of the COVID-19 declaration under Section 564(b)(1) of the Act, 21 U.S.C. section 360bbb-3(b)(1), unless the authorization is terminated or revoked sooner.  Performed at [facility], [address] [place], [place] [address]   Urine Culture     Status: Abnormal   Collection Time: 05/03/22  9:02 AM   Specimen: Urine, Clean Catch  Result Value Ref Range Status   Specimen Description   Final    URINE, CLEAN CATCH Performed at [facility], [address] [place], [place] [address]    Special Requests   Final    NONE Performed at [facility], [address] [place], [place] [address]    Culture (A)  Final    >=100,000 COLONIES/mL ESCHERICHIA COLI  Confirmed Extended Spectrum Beta-Lactamase Producer (ESBL).  In bloodstream infections from ESBL organisms, carbapenems are preferred over piperacillin/tazobactam. They are shown to have a lower risk of mortality.    Report Status 05/06/2022 FINAL  Final   Organism ID, Bacteria ESCHERICHIA COLI (A)  Final      Susceptibility   Escherichia coli - MIC*    AMPICILLIN >=32 RESISTANT Resistant     CEFAZOLIN >=64 RESISTANT Resistant     CEFEPIME <=0.12 SENSITIVE Sensitive     CEFTRIAXONE <=0.25 SENSITIVE Sensitive     CIPROFLOXACIN <=0.25 SENSITIVE Sensitive     GENTAMICIN <=1 SENSITIVE Sensitive     IMIPENEM <=0.25 SENSITIVE Sensitive     NITROFURANTOIN <=16 SENSITIVE Sensitive     TRIMETH/SULFA <=20 SENSITIVE Sensitive     AMPICILLIN/SULBACTAM >=32 RESISTANT Resistant     PIP/TAZO <=4 SENSITIVE Sensitive     * >=100,000 COLONIES/mL ESCHERICHIA COLI         Radiology Studies: No  results found.      Scheduled Meds:  apixaban  5 mg Oral BID   vitamin C  500 mg Oral BID   cholecalciferol  2,000 Units Oral Daily   DULoxetine  60 mg Oral Daily   feeding supplement  237 mL Oral TID BM   furosemide  20 mg Oral Daily   multivitamin with minerals  1 tablet Oral Daily   Continuous Infusions:  cefTRIAXone (ROCEPHIN)  IV 1 g (05/06/22 0659)     LOS: 3 days    Time spent: 30 mins     Wyvonnia Dusky, MD [facility] Pager [phone]  If 7PM-7AM, please contact night-coverage [email or web address] 05/06/2022, 1:05 PM

## 2022-05-06 NOTE — Plan of Care (Signed)

## 2022-05-06 NOTE — TOC Progression Note (Signed)
Transition of Care [facility]) - Progression Note    Patient Details  Name: Matthew [name] MRN: [id number] Date of Birth: September 26, 1950  [facility] CM/SW Contact  Valente David, RN Phone Number: 05/06/2022, 12:41 PM  Clinical Narrative:      Attempted to reach patient's wife to discuss bed offers, unsuccessful.  Requested bedside nurse to contact [facility] team when wife at bedside.       Expected Discharge Plan and Services                                                 Social Determinants of Health (SDOH) Interventions Food Insecurity Interventions: Patient Refused Housing Interventions: Patient Refused Transportation Interventions: Patient Refused Utilities Interventions: Patient Refused  Readmission Risk Interventions    05/04/2022   10:42 AM 10/18/2021   12:39 PM  Readmission Risk Prevention Plan  Transportation Screening  Complete  HRI or Home Care Consult Complete   Palliative Care Screening Complete   Medication Review ([place]) Complete Complete  PCP or Specialist appointment within 3-5 days of discharge  Complete  HRI or [place]  Complete  SW Recovery Care/Counseling Consult  Complete  [place]  Not Applicable

## 2022-05-07 DIAGNOSIS — G9341 Metabolic encephalopathy: Secondary | ICD-10-CM | POA: Diagnosis not present

## 2022-05-07 DIAGNOSIS — R531 Weakness: Secondary | ICD-10-CM | POA: Diagnosis not present

## 2022-05-07 DIAGNOSIS — N10 Acute tubulo-interstitial nephritis: Secondary | ICD-10-CM | POA: Diagnosis not present

## 2022-05-07 LAB — BASIC METABOLIC PANEL
Anion gap: 4 — ABNORMAL LOW (ref 5–15)
BUN: 38 mg/dL — ABNORMAL HIGH (ref 8–23)
CO2: 26 mmol/L (ref 22–32)
Calcium: 9 mg/dL (ref 8.9–10.3)
Chloride: 102 mmol/L (ref 98–111)
Creatinine, Ser: 2.45 mg/dL — ABNORMAL HIGH (ref 0.61–1.24)
GFR, Estimated: 27 mL/min — ABNORMAL LOW (ref >60)
Glucose, Bld: 106 mg/dL — ABNORMAL HIGH (ref 70–99)
Potassium: 4.5 mmol/L (ref 3.5–5.1)
Sodium: 132 mmol/L — ABNORMAL LOW (ref 135–145)

## 2022-05-07 LAB — CBC
HCT: 29 % — ABNORMAL LOW (ref 39.0–52.0)
Hemoglobin: 9.2 g/dL — ABNORMAL LOW (ref 13.0–17.0)
MCH: 30.8 pg (ref 26.0–34.0)
MCHC: 31.7 g/dL (ref 30.0–36.0)
MCV: 97 fL (ref 80.0–100.0)
Platelets: 239 10*3/uL (ref 150–400)
RBC: 2.99 MIL/uL — ABNORMAL LOW (ref 4.22–5.81)
RDW: 15.3 % (ref 11.5–15.5)
WBC: 7.9 10*3/uL (ref 4.0–10.5)
nRBC: 0 % (ref 0.0–0.2)

## 2022-05-07 LAB — MAGNESIUM: Magnesium: 2.4 mg/dL (ref 1.7–2.4)

## 2022-05-07 LAB — PHOSPHORUS: Phosphorus: 2.1 mg/dL — ABNORMAL LOW (ref 2.5–4.6)

## 2022-05-07 MED ORDER — SODIUM PHOSPHATES 45 MMOLE/15ML IV SOLN
15.0000 mmol | Freq: Once | INTRAVENOUS | Status: AC
  Administered 2022-05-07: 15 mmol via INTRAVENOUS
  Filled 2022-05-07: qty 5

## 2022-05-07 NOTE — Progress Notes (Signed)
PROGRESS NOTE    Matthew [name]  EXH:[id number] DOB: 1950/12/21 DOA: 05/03/2022 PCP: Matthew Athens, MD    Assessment & Plan:   Principal Problem:   Acute pyelonephritis Active Problems:   Acute metabolic encephalopathy   Weakness   Hypokalemia   CKD (chronic kidney disease) stage 4, GFR 15-29 ml/min (HCC)   COPD (chronic obstructive pulmonary disease) (HCC)   Chronic systolic CHF (congestive heart failure) (HCC)   Atrial fibrillation, chronic (HCC)   Malnutrition of moderate degree  Assessment and Plan: Acute pyelonephritis: abxs were given before urine cx was taken. Urine cx is growing e. coli. Continue on IV ceftriaxone    Acute metabolic encephalopathy: w/ hx of dementia. Possibly secondary to above infection. Mental status is labile. Continue on IV abxs    Weakness: PT/OT recs SNF. Pt is agreeable to SNF today    Hypokalemia: resolved    CKDIV: Cr is trending down daily. Avoid nephrotoxic meds   ACD: likely secondary to CKD. Will transfuse if Hb < 7.0    COPD: w/o exacerbation. Continue on bronchodilators & encourage incentive spirometry   Chronic systolic CHF: echo 10/20/1015 showed EF of 35 to 40%. Continue on lasix. Monitor I/Os   Chronic a. fib: continue on eliquis. Holding home dose of amio, metoprolol secondary to intermittent bradycardia             DVT prophylaxis: eliquis  Code Status: full  Family Communication: discussed pt's care w/ pt's wife, [name], and answered her questions  Disposition Plan: possibly d/c to SNF  Level of care: Med-Surg  Status is: Inpatient Remains inpatient appropriate because: medically stable but needs SNF placement    Consultants:    Procedures:   Antimicrobials: rocephin   Subjective: Pt c/o fatigue   Objective: Vitals:   05/06/22 0902 05/06/22 1608 05/06/22 1928 05/07/22 0425  BP: 136/81 125/76 (!) 145/79 135/79  Pulse: 85 71 69 63  Resp: '20 18 20 20  '$ Temp: 98 F (36.7 C) (!) 97.4 F (36.3 C) 98 F  (36.7 C) 97.7 F (36.5 C)  TempSrc: Oral Oral Oral Oral  SpO2: 96% 97% 100% 97%  Weight:      Height:        Intake/Output Summary (Last 24 hours) at 05/07/2022 0809 Last data filed at 05/07/2022 0751 Gross per 24 hour  Intake 600 ml  Output 1200 ml  Net -600 ml   Filed Weights   05/03/22 0409 05/03/22 1935 05/04/22 1507  Weight: 113.4 kg 105.2 kg 106.5 kg    Examination:  General exam: Appears calm & comfortable  Respiratory system: clear breath sounds b/l  Cardiovascular system: S1 & S2+. No rubs or gallops Gastrointestinal system: Abd is soft, NT, obese & normal bowel sounds  Central nervous system: Alert and oriented. Moves all extremities  Psychiatry: judgement and insight appears at baseline. Appropriate mood and affect   Data Reviewed: I have personally reviewed following labs and imaging studies  CBC: Recent Labs  Lab 05/03/22 0409 05/04/22 0620 05/05/22 0520 05/06/22 2209 05/07/22 0442  WBC 9.9 6.0 4.6 9.2 7.9  HGB 12.0* 11.9* 10.5* 9.7* 9.2*  HCT 39.2 37.3* 34.0* 30.4* 29.0*  MCV 97.3 94.4 95.8 95.3 97.0  PLT 340 214 241 258 510   Basic Metabolic Panel: Recent Labs  Lab 05/03/22 0409 05/04/22 0824 05/05/22 0520 05/06/22 2209 05/07/22 0442  NA 134* 134* 133* 131* 132*  K 3.2* 3.5 4.1 4.6 4.5  CL 99 101 99 99 102  CO2 25  $'28 26 24 26  'h$ GLUCOSE 122* 79 94 115* 106*  BUN '21 21 23 '$ 36* 38*  CREATININE 2.97* 2.87* 2.79* 2.50* 2.45*  CALCIUM 9.5 8.8* 8.9 9.0 9.0  MG 2.3  --  2.3 2.2 2.4  PHOS  --   --  2.8 1.7* 2.1*   GFR: Estimated Creatinine Clearance: 35.4 mL/min (A) (by C-G formula based on SCr of 2.45 mg/dL (H)). Liver Function Tests: Recent Labs  Lab 05/03/22 0409  AST 18  ALT 8  ALKPHOS 102  BILITOT 0.8  PROT 7.0  ALBUMIN 2.6*   No results for input(s): "LIPASE", "AMYLASE" in the last 168 hours. No results for input(s): "AMMONIA" in the last 168 hours. Coagulation Profile: No results for input(s): "INR", "PROTIME" in the last 168  hours. Cardiac Enzymes: No results for input(s): "CKTOTAL", "CKMB", "CKMBINDEX", "TROPONINI" in the last 168 hours. BNP (last 3 results) No results for input(s): "PROBNP" in the last 8760 hours. HbA1C: No results for input(s): "HGBA1C" in the last 72 hours. CBG: No results for input(s): "GLUCAP" in the last 168 hours. Lipid Profile: No results for input(s): "CHOL", "HDL", "LDLCALC", "TRIG", "CHOLHDL", "LDLDIRECT" in the last 72 hours. Thyroid Function Tests: No results for input(s): "TSH", "T4TOTAL", "FREET4", "T3FREE", "THYROIDAB" in the last 72 hours. Anemia Panel: No results for input(s): "VITAMINB12", "FOLATE", "FERRITIN", "TIBC", "IRON", "RETICCTPCT" in the last 72 hours. Sepsis Labs: No results for input(s): "PROCALCITON", "LATICACIDVEN" in the last 168 hours.  Recent Results (from the past 240 hour(s))  SARS Coronavirus 2 by RT PCR (hospital order, performed in [facility] hospital lab) *cepheid single result test* Anterior Nasal Swab     Status: None   Collection Time: 05/03/22  4:09 AM   Specimen: Anterior Nasal Swab  Result Value Ref Range Status   SARS Coronavirus 2 by RT PCR NEGATIVE NEGATIVE Final    Comment: (NOTE) SARS-CoV-2 target nucleic acids are NOT DETECTED.  The SARS-CoV-2 RNA is generally detectable in upper and lower respiratory specimens during the acute phase of infection. The lowest concentration of SARS-CoV-2 viral copies this assay can detect is 250 copies / mL. A negative result does not preclude SARS-CoV-2 infection and should not be used as the sole basis for treatment or other patient management decisions.  A negative result may occur with improper specimen collection / handling, submission of specimen other than nasopharyngeal swab, presence of viral mutation(s) within the areas targeted by this assay, and inadequate number of viral copies (<250 copies / mL). A negative result must be combined with clinical observations, patient history, and  epidemiological information.  Fact Sheet for Patients:   https://www.patel.info/  Fact Sheet for Healthcare Providers: https://hall.com/  This test is not yet approved or  cleared by the [place] FDA and has been authorized for detection and/or diagnosis of SARS-CoV-2 by FDA under an Emergency Use Authorization (EUA).  This EUA will remain in effect (meaning this test can be used) for the duration of the COVID-19 declaration under Section 564(b)(1) of the Act, 21 U.S.C. section 360bbb-3(b)(1), unless the authorization is terminated or revoked sooner.  Performed at [facility], [address] [place], [place] [address]   Urine Culture     Status: Abnormal   Collection Time: 05/03/22  9:02 AM   Specimen: Urine, Clean Catch  Result Value Ref Range Status   Specimen Description   Final    URINE, CLEAN CATCH Performed at [facility], [address] [place], [place] [address]  Special Requests   Final    NONE Performed at [facility], [address] [place], [place] [address]    Culture (A)  Final    >=100,000 COLONIES/mL ESCHERICHIA COLI Confirmed Extended Spectrum Beta-Lactamase Producer (ESBL).  In bloodstream infections from ESBL organisms, carbapenems are preferred over piperacillin/tazobactam. They are shown to have a lower risk of mortality.    Report Status 05/06/2022 FINAL  Final   Organism ID, Bacteria ESCHERICHIA COLI (A)  Final      Susceptibility   Escherichia coli - MIC*    AMPICILLIN >=32 RESISTANT Resistant     CEFAZOLIN >=64 RESISTANT Resistant     CEFEPIME <=0.12 SENSITIVE Sensitive     CEFTRIAXONE <=0.25 SENSITIVE Sensitive     CIPROFLOXACIN <=0.25 SENSITIVE Sensitive     GENTAMICIN <=1 SENSITIVE Sensitive     IMIPENEM <=0.25 SENSITIVE Sensitive     NITROFURANTOIN <=16 SENSITIVE Sensitive     TRIMETH/SULFA <=20 SENSITIVE Sensitive     AMPICILLIN/SULBACTAM >=32 RESISTANT  Resistant     PIP/TAZO <=4 SENSITIVE Sensitive     * >=100,000 COLONIES/mL ESCHERICHIA COLI         Radiology Studies: No results found.      Scheduled Meds:  apixaban  5 mg Oral BID   vitamin C  500 mg Oral BID   cholecalciferol  2,000 Units Oral Daily   DULoxetine  60 mg Oral Daily   feeding supplement  237 mL Oral TID BM   furosemide  20 mg Oral Daily   multivitamin with minerals  1 tablet Oral Daily   Continuous Infusions:  cefTRIAXone (ROCEPHIN)  IV Stopped (05/06/22 0900)     LOS: 4 days    Time spent: 25 mins     Wyvonnia Dusky, MD [facility] Pager [phone]  If 7PM-7AM, please contact night-coverage [email or web address] 05/07/2022, 8:09 AM

## 2022-05-07 NOTE — Plan of Care (Signed)
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
## 2022-05-07 NOTE — TOC Progression Note (Signed)
Transition of Care [facility]) - Progression Note    Patient Details  Name: Matthew [name] MRN: [id number] Date of Birth: 05-28-1951  Transition of Care [facility]) CM/SW [place], RN Phone Number: 05/07/2022, 1:10 PM  Clinical Narrative:     Spoke with patient's wife to provide bed offers.  She is refusing to accept any offers because they are not in [facility].  State he only has insurance benefits for [facility] and she is unable to visit him if he is outside of the county (she does not drive far).  Educated on the process of SNF bed placement, offers depend on availability, ability to meet patient's needs, as well as insurance.  She remains adamant about wanting patient to go to facility in [facility]  Request to wait until tomorrow to make a decision.         Expected Discharge Plan and Services                                                 Social Determinants of Health (SDOH) Interventions Food Insecurity Interventions: Patient Refused Housing Interventions: Patient Refused Transportation Interventions: Patient Refused Utilities Interventions: Patient Refused  Readmission Risk Interventions    05/04/2022   10:42 AM 10/18/2021   12:39 PM  Readmission Risk Prevention Plan  Transportation Screening  Complete  HRI or Home Care Consult Complete   Palliative Care Screening Complete   Medication Review ([place]) Complete Complete  PCP or Specialist appointment within 3-5 days of discharge  Complete  HRI or [place]  Complete  SW Recovery Care/Counseling Consult  Complete  [place]  Not Applicable

## 2022-05-08 DIAGNOSIS — R531 Weakness: Secondary | ICD-10-CM | POA: Diagnosis not present

## 2022-05-08 DIAGNOSIS — N10 Acute tubulo-interstitial nephritis: Secondary | ICD-10-CM | POA: Diagnosis not present

## 2022-05-08 DIAGNOSIS — G9341 Metabolic encephalopathy: Secondary | ICD-10-CM | POA: Diagnosis not present

## 2022-05-08 LAB — CBC
HCT: 30.8 % — ABNORMAL LOW (ref 39.0–52.0)
Hemoglobin: 9.6 g/dL — ABNORMAL LOW (ref 13.0–17.0)
MCH: 29.9 pg (ref 26.0–34.0)
MCHC: 31.2 g/dL (ref 30.0–36.0)
MCV: 96 fL (ref 80.0–100.0)
Platelets: 271 10*3/uL (ref 150–400)
RBC: 3.21 MIL/uL — ABNORMAL LOW (ref 4.22–5.81)
RDW: 15.6 % — ABNORMAL HIGH (ref 11.5–15.5)
WBC: 6.1 10*3/uL (ref 4.0–10.5)
nRBC: 0 % (ref 0.0–0.2)

## 2022-05-08 LAB — BASIC METABOLIC PANEL
Anion gap: 4 — ABNORMAL LOW (ref 5–15)
BUN: 38 mg/dL — ABNORMAL HIGH (ref 8–23)
CO2: 28 mmol/L (ref 22–32)
Calcium: 9 mg/dL (ref 8.9–10.3)
Chloride: 104 mmol/L (ref 98–111)
Creatinine, Ser: 2.59 mg/dL — ABNORMAL HIGH (ref 0.61–1.24)
GFR, Estimated: 26 mL/min — ABNORMAL LOW (ref >60)
Glucose, Bld: 78 mg/dL (ref 70–99)
Potassium: 5.1 mmol/L (ref 3.5–5.1)
Sodium: 136 mmol/L (ref 135–145)

## 2022-05-08 NOTE — Progress Notes (Signed)
Patient has been having significant pain in his left knee. He has chronic pain in that knee. said he uses heat at home. Dr Jimmye Norman notified and gave an order for a heating pad

## 2022-05-08 NOTE — TOC Progression Note (Signed)
Transition of Care [facility]) - Progression Note    Patient Details  Name: Matthew [name] MRN: [id number] Date of Birth: 01-08-1951  Transition of Care [facility]) CM/SW Contact  Beverly Sessions, RN Phone Number: 05/08/2022, 3:31 PM  Clinical Narrative:        Followed up with wife via phone.  Reviewed bed offers. She would like to accept [facility] Accepted in [place] and notified Lavella Lemons at [facility] with TOC started auth in [address] [employer] portal     Expected Discharge Plan and Services                                                 Social Determinants of Health (SDOH) Interventions Food Insecurity Interventions: Patient Refused Housing Interventions: Patient Refused Transportation Interventions: Patient Refused Utilities Interventions: Patient Refused  Readmission Risk Interventions    05/04/2022   10:42 AM 10/18/2021   12:39 PM  Readmission Risk Prevention Plan  Transportation Screening  Complete  HRI or Home Care Consult Complete   Palliative Care Screening Complete   Medication Review ([place]) Complete Complete  PCP or Specialist appointment within 3-5 days of discharge  Complete  HRI or [place]  Complete  SW Recovery Care/Counseling Consult  Complete  [place]  Not Applicable

## 2022-05-08 NOTE — Progress Notes (Signed)
PROGRESS NOTE    Matthew [name]  PJK:[id number] DOB: Oct 25, 1950 DOA: 05/03/2022 PCP: [name], MD    Assessment & Plan:   Principal Problem:   Acute pyelonephritis Active Problems:   Acute metabolic encephalopathy   Weakness   Hypokalemia   CKD (chronic kidney disease) stage 4, GFR 15-29 ml/min (HCC)   COPD (chronic obstructive pulmonary disease) (HCC)   Chronic systolic CHF (congestive heart failure) (HCC)   Atrial fibrillation, chronic (HCC)   Malnutrition of moderate degree  Assessment and Plan: Acute pyelonephritis: abxs were given before urine cx was taken. Urine cx is growing e. coli. Continue on IV rocephin    Acute metabolic encephalopathy: w/ hx of dementia. Possibly secondary to above infection. Mental status is labile. Continue w/ supportive care    Weakness: PT/OT recs SNF. Pt is agreeable to SNF   Hypokalemia: resolved    CKDIV: Cr is trending up from day prior. Avoid nephrotoxic meds   ACD: likely secondary to CKD. No need for a transfusion currently    COPD: w/o exacerbation. Continue on bronchodilators & encourage incentive spirometry    Chronic systolic CHF: echo 8/0/9983 showed EF of 35 to 40%. Holding lasix today. Monitor I/Os.  Chronic a. fib: continue on eliquis. Holding home dose of amiodarone, metoprolol secondary to intermittent bradycardia              DVT prophylaxis: eliquis  Code Status: full  Family Communication:  Disposition Plan: possibly d/c to SNF vs HH   Level of care: Med-Surg  Status is: Inpatient Remains inpatient appropriate because: SNF vs HH. Pt's wife will make a decision today    Consultants:    Procedures:   Antimicrobials: rocephin   Subjective: Pt c/o knee pain    Objective: Vitals:   05/07/22 1512 05/07/22 1946 05/08/22 0446 05/08/22 0757  BP: 131/75 138/82 (!) 140/98 (!) 140/77  Pulse: 64 60 60 62  Resp: '18 20 16 18  '$ Temp: 97.9 F (36.6 C) (!) 96.7 F (35.9 C) (!) 97.5 F (36.4 C) 97.6  F (36.4 C)  TempSrc:  Axillary Oral   SpO2: 95% 100% 96% 99%  Weight:      Height:        Intake/Output Summary (Last 24 hours) at 05/08/2022 0806 Last data filed at 05/08/2022 0757 Gross per 24 hour  Intake 1050 ml  Output 1750 ml  Net -700 ml   Filed Weights   05/03/22 0409 05/03/22 1935 05/04/22 1507  Weight: 113.4 kg 105.2 kg 106.5 kg    Examination:  General exam: Appears calm Respiratory system: clear breath sounds b/l. No rubs or clicks   Cardiovascular system: S1/S2+. No rubs or clicks  Gastrointestinal system: Abd is soft, NT, obese & hypoactive bowel sounds  Central nervous system: alert and oriented. Moves all extremities Psychiatry: judgement and insight appears at baseline. Flat mood and affect    Data Reviewed: I have personally reviewed following labs and imaging studies  CBC: Recent Labs  Lab 05/04/22 0620 05/05/22 0520 05/06/22 2209 05/07/22 0442 05/08/22 0444  WBC 6.0 4.6 9.2 7.9 6.1  HGB 11.9* 10.5* 9.7* 9.2* 9.6*  HCT 37.3* 34.0* 30.4* 29.0* 30.8*  MCV 94.4 95.8 95.3 97.0 96.0  PLT 214 241 258 239 382   Basic Metabolic Panel: Recent Labs  Lab 05/03/22 0409 05/04/22 0824 05/05/22 0520 05/06/22 2209 05/07/22 0442 05/08/22 0444  NA 134* 134* 133* 131* 132* 136  K 3.2* 3.5 4.1 4.6 4.5 5.1  CL 99 101  99 99 102 104  CO2 '25 28 26 24 26 28  '$ GLUCOSE 122* 79 94 115* 106* 78  BUN '21 21 23 '$ 36* 38* 38*  CREATININE 2.97* 2.87* 2.79* 2.50* 2.45* 2.59*  CALCIUM 9.5 8.8* 8.9 9.0 9.0 9.0  MG 2.3  --  2.3 2.2 2.4  --   PHOS  --   --  2.8 1.7* 2.1*  --    GFR: Estimated Creatinine Clearance: 33.5 mL/min (A) (by C-G formula based on SCr of 2.59 mg/dL (H)). Liver Function Tests: Recent Labs  Lab 05/03/22 0409  AST 18  ALT 8  ALKPHOS 102  BILITOT 0.8  PROT 7.0  ALBUMIN 2.6*   No results for input(s): "LIPASE", "AMYLASE" in the last 168 hours. No results for input(s): "AMMONIA" in the last 168 hours. Coagulation Profile: No results for  input(s): "INR", "PROTIME" in the last 168 hours. Cardiac Enzymes: No results for input(s): "CKTOTAL", "CKMB", "CKMBINDEX", "TROPONINI" in the last 168 hours. BNP (last 3 results) No results for input(s): "PROBNP" in the last 8760 hours. HbA1C: No results for input(s): "HGBA1C" in the last 72 hours. CBG: No results for input(s): "GLUCAP" in the last 168 hours. Lipid Profile: No results for input(s): "CHOL", "HDL", "LDLCALC", "TRIG", "CHOLHDL", "LDLDIRECT" in the last 72 hours. Thyroid Function Tests: No results for input(s): "TSH", "T4TOTAL", "FREET4", "T3FREE", "THYROIDAB" in the last 72 hours. Anemia Panel: No results for input(s): "VITAMINB12", "FOLATE", "FERRITIN", "TIBC", "IRON", "RETICCTPCT" in the last 72 hours. Sepsis Labs: No results for input(s): "PROCALCITON", "LATICACIDVEN" in the last 168 hours.  Recent Results (from the past 240 hour(s))  SARS Coronavirus 2 by RT PCR (hospital order, performed in [facility] hospital lab) *cepheid single result test* Anterior Nasal Swab     Status: None   Collection Time: 05/03/22  4:09 AM   Specimen: Anterior Nasal Swab  Result Value Ref Range Status   SARS Coronavirus 2 by RT PCR NEGATIVE NEGATIVE Final    Comment: (NOTE) SARS-CoV-2 target nucleic acids are NOT DETECTED.  The SARS-CoV-2 RNA is generally detectable in upper and lower respiratory specimens during the acute phase of infection. The lowest concentration of SARS-CoV-2 viral copies this assay can detect is 250 copies / mL. A negative result does not preclude SARS-CoV-2 infection and should not be used as the sole basis for treatment or other patient management decisions.  A negative result may occur with improper specimen collection / handling, submission of specimen other than nasopharyngeal swab, presence of viral mutation(s) within the areas targeted by this assay, and inadequate number of viral copies (<250 copies / mL). A negative result must be combined with  clinical observations, patient history, and epidemiological information.  Fact Sheet for Patients:   https://www.patel.info/  Fact Sheet for Healthcare Providers: https://hall.com/  This test is not yet approved or  cleared by the [place] FDA and has been authorized for detection and/or diagnosis of SARS-CoV-2 by FDA under an Emergency Use Authorization (EUA).  This EUA will remain in effect (meaning this test can be used) for the duration of the COVID-19 declaration under Section 564(b)(1) of the Act, 21 U.S.C. section 360bbb-3(b)(1), unless the authorization is terminated or revoked sooner.  Performed at [facility], [address], [place], [place] [address]   Urine Culture     Status: Abnormal   Collection Time: 05/03/22  9:02 AM   Specimen: Urine, Clean Catch  Result Value Ref Range Status   Specimen Description   Final  URINE, CLEAN CATCH Performed at [facility], [address], [place], [place] [address]    Special Requests   Final    NONE Performed at [facility], [address] [place], [place] [address]    Culture (A)  Final    >=100,000 COLONIES/mL ESCHERICHIA COLI Confirmed Extended Spectrum Beta-Lactamase Producer (ESBL).  In bloodstream infections from ESBL organisms, carbapenems are preferred over piperacillin/tazobactam. They are shown to have a lower risk of mortality.    Report Status 05/06/2022 FINAL  Final   Organism ID, Bacteria ESCHERICHIA COLI (A)  Final      Susceptibility   Escherichia coli - MIC*    AMPICILLIN >=32 RESISTANT Resistant     CEFAZOLIN >=64 RESISTANT Resistant     CEFEPIME <=0.12 SENSITIVE Sensitive     CEFTRIAXONE <=0.25 SENSITIVE Sensitive     CIPROFLOXACIN <=0.25 SENSITIVE Sensitive     GENTAMICIN <=1 SENSITIVE Sensitive     IMIPENEM <=0.25 SENSITIVE Sensitive     NITROFURANTOIN <=16 SENSITIVE Sensitive     TRIMETH/SULFA <=20 SENSITIVE  Sensitive     AMPICILLIN/SULBACTAM >=32 RESISTANT Resistant     PIP/TAZO <=4 SENSITIVE Sensitive     * >=100,000 COLONIES/mL ESCHERICHIA COLI         Radiology Studies: No results found.      Scheduled Meds:  apixaban  5 mg Oral BID   vitamin C  500 mg Oral BID   cholecalciferol  2,000 Units Oral Daily   DULoxetine  60 mg Oral Daily   feeding supplement  237 mL Oral TID BM   furosemide  20 mg Oral Daily   multivitamin with minerals  1 tablet Oral Daily   Continuous Infusions:  cefTRIAXone (ROCEPHIN)  IV 200 mL/hr at 05/08/22 0757     LOS: 5 days    Time spent: 25 mins     Wyvonnia Dusky, MD [facility] Pager [phone]  If 7PM-7AM, please contact night-coverage [email or web address] 05/08/2022, 8:06 AM

## 2022-05-08 NOTE — Progress Notes (Signed)
OT Cancellation Note  Patient Details Name: Matthew [name] MRN: [id number] DOB: Jan 06, 1951   Cancelled Treatment:    Reason Eval/Treat Not Completed: Patient declined, no reason specified;Pain limiting ability to participate. Pt endorsing knee pain to significant to work with OT this date. RN notified. Will re-attempt next date as tolerated with optimal pain mgt.   Ardeth Perfect., MPH, MS, OTR/L ascom [phone] 05/08/22, 3:07 PM
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
## 2022-05-08 NOTE — TOC Progression Note (Signed)
Transition of Care [facility]) - Progression Note    Patient Details  Name: Matthew [name] MRN: [id number] Date of Birth: January 13, 1951  Transition of Care [facility]) CM/SW Contact  Beverly Sessions, RN Phone Number: 05/08/2022, 10:21 AM  Clinical Narrative:      Spoke with patient's wife via phone Per MD anticiapted patient ready for discharge tomorrow Wife aware that she will have to make decision on bed offers today.  If she declines the offers she is aware patient will have to return home at discharge - I have reached out to [facility] per wifes request to see if they can offer a bed       Expected Discharge Plan and Services                                                 Social Determinants of Health (SDOH) Interventions Food Insecurity Interventions: Patient Refused Housing Interventions: Patient Refused Transportation Interventions: Patient Refused Utilities Interventions: Patient Refused  Readmission Risk Interventions    05/04/2022   10:42 AM 10/18/2021   12:39 PM  Readmission Risk Prevention Plan  Transportation Screening  Complete  HRI or Home Care Consult Complete   Palliative Care Screening Complete   Medication Review (RN Care Manager) Complete Complete  PCP or Specialist appointment within 3-5 days of discharge  Complete  HRI or [place]  Complete  SW Recovery Care/Counseling Consult  Complete  [place]  Not Applicable

## 2022-05-09 DIAGNOSIS — R531 Weakness: Secondary | ICD-10-CM | POA: Diagnosis not present

## 2022-05-09 DIAGNOSIS — G9341 Metabolic encephalopathy: Secondary | ICD-10-CM | POA: Diagnosis not present

## 2022-05-09 DIAGNOSIS — N10 Acute tubulo-interstitial nephritis: Secondary | ICD-10-CM | POA: Diagnosis not present

## 2022-05-09 LAB — BASIC METABOLIC PANEL
Anion gap: 3 — ABNORMAL LOW (ref 5–15)
BUN: 47 mg/dL — ABNORMAL HIGH (ref 8–23)
CO2: 27 mmol/L (ref 22–32)
Calcium: 8.5 mg/dL — ABNORMAL LOW (ref 8.9–10.3)
Chloride: 102 mmol/L (ref 98–111)
Creatinine, Ser: 2.51 mg/dL — ABNORMAL HIGH (ref 0.61–1.24)
GFR, Estimated: 27 mL/min — ABNORMAL LOW (ref >60)
Glucose, Bld: 86 mg/dL (ref 70–99)
Potassium: 5.6 mmol/L — ABNORMAL HIGH (ref 3.5–5.1)
Sodium: 132 mmol/L — ABNORMAL LOW (ref 135–145)

## 2022-05-09 LAB — CBC
HCT: 30.7 % — ABNORMAL LOW (ref 39.0–52.0)
Hemoglobin: 9.1 g/dL — ABNORMAL LOW (ref 13.0–17.0)
MCH: 29.1 pg (ref 26.0–34.0)
MCHC: 29.6 g/dL — ABNORMAL LOW (ref 30.0–36.0)
MCV: 98.1 fL (ref 80.0–100.0)
Platelets: 258 10*3/uL (ref 150–400)
RBC: 3.13 MIL/uL — ABNORMAL LOW (ref 4.22–5.81)
RDW: 15.9 % — ABNORMAL HIGH (ref 11.5–15.5)
WBC: 6 10*3/uL (ref 4.0–10.5)
nRBC: 0 % (ref 0.0–0.2)

## 2022-05-09 LAB — POTASSIUM: Potassium: 5.4 mmol/L — ABNORMAL HIGH (ref 3.5–5.1)

## 2022-05-09 MED ORDER — FUROSEMIDE 10 MG/ML IJ SOLN
40.0000 mg | Freq: Once | INTRAMUSCULAR | Status: AC
  Administered 2022-05-09: 40 mg via INTRAVENOUS
  Filled 2022-05-09: qty 4

## 2022-05-09 MED ORDER — SODIUM ZIRCONIUM CYCLOSILICATE 10 G PO PACK
10.0000 g | PACK | Freq: Once | ORAL | Status: AC
  Administered 2022-05-09: 10 g via ORAL
  Filled 2022-05-09: qty 1

## 2022-05-09 MED ORDER — FUROSEMIDE 20 MG PO TABS
20.0000 mg | ORAL_TABLET | Freq: Once | ORAL | Status: AC
  Administered 2022-05-09: 20 mg via ORAL
  Filled 2022-05-09: qty 1

## 2022-05-09 NOTE — Plan of Care (Signed)

## 2022-05-09 NOTE — Plan of Care (Signed)
  Problem: Education: Goal: Knowledge of General Education information will improve Description: Including pain rating scale, medication(s)/side effects and non-pharmacologic comfort measures 05/09/2022 0918 by Evelena Peat, RN Outcome: Progressing 05/09/2022 0918 by Evelena Peat, RN Outcome: Progressing   Problem: Health Behavior/Discharge Planning: Goal: Ability to manage health-related needs will improve 05/09/2022 0918 by Evelena Peat, RN Outcome: Progressing 05/09/2022 0918 by Evelena Peat, RN Outcome: Progressing   Problem: Clinical Measurements: Goal: Ability to maintain clinical measurements within normal limits will improve 05/09/2022 0918 by Evelena Peat, RN Outcome: Progressing 05/09/2022 0918 by Evelena Peat, RN Outcome: Progressing Goal: Will remain free from infection 05/09/2022 0918 by Evelena Peat, RN Outcome: Progressing 05/09/2022 0918 by Evelena Peat, RN Outcome: Progressing Goal: Diagnostic test results will improve 05/09/2022 0918 by Evelena Peat, RN Outcome: Progressing 05/09/2022 0918 by Evelena Peat, RN Outcome: Progressing Goal: Respiratory complications will improve 05/09/2022 0918 by Evelena Peat, RN Outcome: Progressing 05/09/2022 0918 by Evelena Peat, RN Outcome: Progressing Goal: Cardiovascular complication will be avoided 05/09/2022 0918 by Evelena Peat, RN Outcome: Progressing 05/09/2022 0918 by Evelena Peat, RN Outcome: Progressing

## 2022-05-09 NOTE — TOC Progression Note (Deleted)
Transition of Care [facility]) - Progression Note    Patient Details  Name: Matthew [name] MRN: [id number] Date of Birth: 1951/02/09  [facility] CM/SW Contact  Beverly Sessions, RN Phone Number: 05/09/2022, 8:46 AM  Clinical Narrative:     Insurance auth pending        Expected Discharge Plan and Services                                                 Social Determinants of Health (SDOH) Interventions Food Insecurity Interventions: Patient Refused Housing Interventions: Patient Refused Transportation Interventions: Patient Refused Utilities Interventions: Patient Refused  Readmission Risk Interventions    05/04/2022   10:42 AM 10/18/2021   12:39 PM  Readmission Risk Prevention Plan  Transportation Screening  Complete  HRI or Home Care Consult Complete   Palliative Care Screening Complete   Medication Review ([place]) Complete Complete  PCP or Specialist appointment within 3-5 days of discharge  Complete  HRI or [place]  Complete  SW Recovery Care/Counseling Consult  Complete  [place]  Not Applicable

## 2022-05-09 NOTE — TOC Progression Note (Signed)
Transition of Care [facility]) - Progression Note    Patient Details  Name: Matthew [name] MRN: [id number] Date of Birth: 03-24-1951  Transition of Care [facility]) CM/SW Contact  Beverly Sessions, RN Phone Number: 05/09/2022, 8:41 AM  Clinical Narrative:    approved for SNF from 9/26 - 9/28, next review due 9/28. Reference ID: [id number]. Plan Auth: [id number]        Expected Discharge Plan and Services                                                 Social Determinants of Health (SDOH) Interventions Food Insecurity Interventions: Patient Refused Housing Interventions: Patient Refused Transportation Interventions: Patient Refused Utilities Interventions: Patient Refused  Readmission Risk Interventions    05/04/2022   10:42 AM 10/18/2021   12:39 PM  Readmission Risk Prevention Plan  Transportation Screening  Complete  HRI or Home Care Consult Complete   Palliative Care Screening Complete   Medication Review ([place]) Complete Complete  PCP or Specialist appointment within 3-5 days of discharge  Complete  HRI or [place]  Complete  SW Recovery Care/Counseling Consult  Complete  [place]  Not Applicable
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
## 2022-05-09 NOTE — TOC Progression Note (Signed)
Transition of Care [facility]) - Progression Note    Patient Details  Name: YONAS BUNDA MRN: [id number] Date of Birth: 01-Feb-1951  Transition of Care [facility]) CM/SW Contact  Beverly Sessions, RN Phone Number: 05/09/2022, 1:52 PM  Clinical Narrative:     Per MD patient to discharge tomorrow.  Lavella Lemons at [facility] updated. Wife Updated        Expected Discharge Plan and Services                                                 Social Determinants of Health (SDOH) Interventions Food Insecurity Interventions: Patient Refused Housing Interventions: Patient Refused Transportation Interventions: Patient Refused Utilities Interventions: Patient Refused  Readmission Risk Interventions    05/04/2022   10:42 AM 10/18/2021   12:39 PM  Readmission Risk Prevention Plan  Transportation Screening  Complete  HRI or Home Care Consult Complete   Palliative Care Screening Complete   Medication Review ([place]) Complete Complete  PCP or Specialist appointment within 3-5 days of discharge  Complete  HRI or [place]  Complete  SW Recovery Care/Counseling Consult  Complete  [place]  Not Applicable

## 2022-05-10 ENCOUNTER — Inpatient Hospital Stay: Payer: Medicare HMO

## 2022-05-10 DIAGNOSIS — I482 Chronic atrial fibrillation, unspecified: Secondary | ICD-10-CM | POA: Diagnosis not present

## 2022-05-10 DIAGNOSIS — I5022 Chronic systolic (congestive) heart failure: Secondary | ICD-10-CM | POA: Diagnosis not present

## 2022-05-10 DIAGNOSIS — N184 Chronic kidney disease, stage 4 (severe): Secondary | ICD-10-CM | POA: Diagnosis not present

## 2022-05-10 DIAGNOSIS — N10 Acute tubulo-interstitial nephritis: Secondary | ICD-10-CM | POA: Diagnosis not present

## 2022-05-10 DIAGNOSIS — E875 Hyperkalemia: Secondary | ICD-10-CM

## 2022-05-10 DIAGNOSIS — M25512 Pain in left shoulder: Secondary | ICD-10-CM | POA: Diagnosis present

## 2022-05-10 LAB — BASIC METABOLIC PANEL
Anion gap: 6 (ref 5–15)
BUN: 53 mg/dL — ABNORMAL HIGH (ref 8–23)
CO2: 29 mmol/L (ref 22–32)
Calcium: 8.5 mg/dL — ABNORMAL LOW (ref 8.9–10.3)
Chloride: 95 mmol/L — ABNORMAL LOW (ref 98–111)
Creatinine, Ser: 2.5 mg/dL — ABNORMAL HIGH (ref 0.61–1.24)
GFR, Estimated: 27 mL/min — ABNORMAL LOW (ref >60)
Glucose, Bld: 89 mg/dL (ref 70–99)
Potassium: 5.7 mmol/L — ABNORMAL HIGH (ref 3.5–5.1)
Sodium: 130 mmol/L — ABNORMAL LOW (ref 135–145)

## 2022-05-10 LAB — POTASSIUM: Potassium: 5.5 mmol/L — ABNORMAL HIGH (ref 3.5–5.1)

## 2022-05-10 LAB — BRAIN NATRIURETIC PEPTIDE: B Natriuretic Peptide: 153.3 pg/mL — ABNORMAL HIGH (ref 0.0–100.0)

## 2022-05-10 LAB — MAGNESIUM: Magnesium: 2.3 mg/dL (ref 1.7–2.4)

## 2022-05-10 MED ORDER — FUROSEMIDE 10 MG/ML IJ SOLN
20.0000 mg | Freq: Once | INTRAMUSCULAR | Status: AC
  Administered 2022-05-10: 20 mg via INTRAVENOUS
  Filled 2022-05-10: qty 4

## 2022-05-10 MED ORDER — FUROSEMIDE 10 MG/ML IJ SOLN
40.0000 mg | Freq: Two times a day (BID) | INTRAMUSCULAR | Status: DC
  Administered 2022-05-10 – 2022-05-11 (×2): 40 mg via INTRAVENOUS
  Filled 2022-05-10 (×2): qty 4

## 2022-05-10 NOTE — Assessment & Plan Note (Addendum)
Unclear etiology.  Improved during the day, but then continues to peak in the morning, despite Lasix.  Have stopped Compazine and trazodone as there are reports of inconsistent electrolyte abnormalities.  Have started 3 times daily Lokelma.

## 2022-05-10 NOTE — Plan of Care (Signed)

## 2022-05-10 NOTE — TOC Progression Note (Signed)
Transition of Care [facility]) - Progression Note    Patient Details  Name: Matthew [name] MRN: [id number] Date of Birth: 04/26/1951  Transition of Care [facility]) CM/SW Contact  Beverly Sessions, RN Phone Number: 05/10/2022, 3:04 PM  Clinical Narrative:    Per MD medically not clear for DC.  Lavella Lemons at [facility] notified        Expected Discharge Plan and Services                                                 Social Determinants of Health (SDOH) Interventions Food Insecurity Interventions: Patient Refused Housing Interventions: Patient Refused Transportation Interventions: Patient Refused Utilities Interventions: Patient Refused  Readmission Risk Interventions    05/04/2022   10:42 AM 10/18/2021   12:39 PM  Readmission Risk Prevention Plan  Transportation Screening  Complete  HRI or Home Care Consult Complete   Palliative Care Screening Complete   Medication Review ([place]) Complete Complete  PCP or Specialist appointment within 3-5 days of discharge  Complete  HRI or [place]  Complete  SW Recovery Care/Counseling Consult  Complete  [place]  Not Applicable

## 2022-05-10 NOTE — Assessment & Plan Note (Signed)
Meets criteria BMI greater than 30 

## 2022-05-10 NOTE — Assessment & Plan Note (Signed)
Nutrition Status: Nutrition Problem: Moderate Malnutrition Etiology: chronic illness Signs/Symptoms: mild fat depletion, moderate muscle depletion, percent weight loss Percent weight loss: 18 %   By nutrition.  Placed on multivitamin supplementation plus Ensure 3 times daily.  High refeeding risk.

## 2022-05-10 NOTE — Assessment & Plan Note (Signed)
Unclear length of time.  Patient states it only started since he was admitted, but states that right shoulder has been bothering him for extended period of time.  Examined and noted to have some pain with rotation and extension.  X-rays checked and patient found to have bone spurs and DJD, but no evidence of fracture.  Recommend physical therapy
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
## 2022-05-10 NOTE — Progress Notes (Signed)
Nutrition Follow Up Note   DOCUMENTATION CODES:   Non-severe (moderate) malnutrition in context of chronic illness  INTERVENTION:   Ensure Enlive po TID, each supplement provides 350 kcal and 20 grams of protein.  MVI po daily  Vitamin C '500mg'$  po BID  Cholecalciferol 2000 units po daily   Pt at high refeed risk; recommend monitor potassium, magnesium and phosphorus labs daily until stable  NUTRITION DIAGNOSIS:   Moderate Malnutrition related to chronic illness as evidenced by mild fat depletions, moderate muscle depletions and 18 percent weight loss in 6 months.  GOAL:   Patient will meet greater than or equal to 90% of their needs -progressing   MONITOR:   PO intake, Supplement acceptance, Labs, Weight trends, Skin, I & O's  ASSESSMENT:   71 y/o male with h/o mild dementia, COPD, CHF, OSA, CKD IV, Afib, gout, hernia s/p repair, depression, OSA and CAD who is admitted with acute pyelonephritis.  Pt with improved appetite and oral intake; pt eating 100% of meals in hospital and is drinking the Ensure supplements. Pt remains at high refeed risk; will recheck electrolytes tomorrow. Recommend continue supplements and vitamins. Vitamin D returned low and is being supplemented. Per chart, pt remains weight stable since admission. Plan is for SNF at discharge.   Medications reviewed and include: vitamin C, D3, MVI, ceftriaxone  Labs reviewed: Na 130(L), K 5.7(H), BUN 53(H), creat 2.50(H) P 2.1(L), Mg 2.4 wnl- 9/24 Vitamin D 22.44(L)- 9/22 Hgb 9.1(L), Hct 30.7(L)  Diet Order:   Diet Order             Diet regular Room service appropriate? Yes; Fluid consistency: Thin  Diet effective now                  EDUCATION NEEDS:   Education needs have been addressed  Skin:  Skin Assessment: Reviewed RN Assessment (lumbar wounds)  Last BM:  9/27- type 6  Height:   Ht Readings from Last 1 Encounters:  05/03/22 '6\' 2"'$  (1.88 m)    Weight:   Wt Readings from Last 1  Encounters:  05/04/22 106.5 kg    Ideal Body Weight:  86.36 kg  BMI:  Body mass index is 30.13 kg/m.  Estimated Nutritional Needs:   Kcal:  2300-2600kcal/day  Protein:  115-130g/day  Fluid:  2.0L/day  Koleen Distance MS, RD, LDN Please refer to [facility] for RD and/or RD on-call/weekend/after hours pager

## 2022-05-10 NOTE — Hospital Course (Addendum)
71 year old male with past medical history of atrial fibrillation on Eliquis, systolic heart failure with ejection fraction 35 to 40%, stage IV chronic kidney disease and senile dementia who presented to the emergency room on 9/20 with left-sided abdominal pain and worsening confusion.  Patient found to have acute pyelonephritis and treated with IV fluids and antibiotics patient treated and improved.  On patient noted to have some mild hyperkalemia, however since then normal.  Initial plan was to discharge patient on 9/26 when he was noted left potassium of 5.6.  Since then, patient has continued to have persistent hyperkalemia.

## 2022-05-10 NOTE — Progress Notes (Signed)
[facility] Progress Note  Patient: Matthew [name]    LPF:[id number]  DOA: 05/03/2022    Date of Service: the patient was seen and examined on 05/10/2022  Brief hospital course: 71 year old male with past medical history of atrial fibrillation on Eliquis, systolic heart failure with ejection fraction 35 to 40%, stage IV chronic kidney disease and senile dementia who presented to the emergency room on 9/20 with left-sided abdominal pain and worsening confusion.  Patient found to have acute pyelonephritis and treated with IV fluids and antibiotics patient treated and improved.  On patient noted to have some mild hypokalemia, however since then normal.  Initial plan was to discharge patient on 9/26 when he was noted left potassium of 5.6.  Patient given a dose of Lasix as well as Lokelma.  Follow-up potassium a few hours later noted mild improvement.  However, on the morning of 9/27, potassium elevated at 5.7.  Assessment and Plan: Assessment and Plan: * Acute pyelonephritis CT suggest pyelonephritis.  Treated with 7 days of IV Rocephin.  Senile dementia with acute confusional state, with behavioral disturbance (HCC)-resolved as of 05/10/2022 Acute delirium causes secondary to urinary tract infection/pyelonephritis.  These have been since resolved and he is back to his baseline.  PT and OT recommending short-term skilled nursing.  Hyperkalemia Unclear etiology.  Slight improvement with Lokelma, but higher today.  Give additional dose of Lasix.  Rechecking this afternoon.  Weakness Worse after infection.  Patient uses cane, walker as well as wheelchair for ambulation.  PT and OT recommending skilled nursing.  CKD (chronic kidney disease) stage 4, GFR 15-29 ml/min (HCC) Creatinine remained stable at baseline  COPD (chronic obstructive pulmonary disease) ([place]) - Patient has history of COPD/asthma.  Not sure if he uses home oxygen. - Not in any exacerbation.  Chronic systolic CHF (congestive  heart failure) ([place]) - Patient has history of chronic systolic heart failure.  His recent 2D echocardiogram dated 10/18/2021 showed left ventricular EF of 35 to 40%. -Patient not in CHF exacerbation.  BNP checked and essentially normal. -Continue his home medication.  Strict intake and output and daily weight check.  2 g sodium and 1500 cc fluid restricted diet.  Atrial fibrillation, chronic ([place]) - Patient has chronic A-fib. -Initial heart rate in the ED 70. -Patient currently on amiodarone, Toprol-XL as well as Lopressor.  I will wait for patient's medication to be reconciled and closely monitor his vitals on telemetry. -Continue Eliquis for anticoagulation.  Hypokalemia-resolved as of 05/10/2022 - Serum potassium 3.2. - Replace with oral potassium 40 mEq x 1 dose.  Check serum magnesium level. - Recheck BMP in AM.  Left shoulder pain Unclear length of time.  Patient states it only started since he was admitted, but states that right shoulder has been bothering him for extended period of time.  Examined and noted to have some pain with rotation and extension.  X-rays checked and patient found to have bone spurs and DJD, but no evidence of fracture.  Recommend physical therapy  Malnutrition of moderate degree Nutrition Status: Nutrition Problem: Moderate Malnutrition Etiology: chronic illness Signs/Symptoms: mild fat depletion, moderate muscle depletion, percent weight loss Percent weight loss: 18 %   By nutrition.  Placed on multivitamin supplementation plus Ensure 3 times daily.  High refeeding risk.    Obesity (BMI 30-39.9) Meets criteria BMI greater than 30       Body mass index is 30.13 kg/m.  Nutrition Problem: Moderate Malnutrition Etiology: chronic illness     Consultants:  None  Procedures: None  Antimicrobials: IV Rocephin x7 days  Code Status: Full code   Subjective: Patient complains of left shoulder pain  Objective: Noted mild bradycardia Vitals:    05/10/22 0358 05/10/22 0916  BP: 126/65 (!) 151/83  Pulse: (!) 55 63  Resp: 18 18  Temp: (!) 97.5 F (36.4 C) 97.6 F (36.4 C)  SpO2: 98% 100%    Intake/Output Summary (Last 24 hours) at 05/10/2022 1506 Last data filed at 05/10/2022 1311 Gross per 24 hour  Intake 300 ml  Output 3225 ml  Net -2925 ml   Filed Weights   05/03/22 0409 05/03/22 1935 05/04/22 1507  Weight: 113.4 kg 105.2 kg 106.5 kg   Body mass index is 30.13 kg/m.  Exam:  General: Alert and oriented x2, no acute distress HEENT: Cephalic, atraumatic, mucous membranes are moist Cardiovascular: Regular rate and rhythm, S1-S2 Respiratory: Clear to auscultation bilaterally Abdomen: Soft, nontender, nondistended, positive bowel sounds Musculoskeletal: Clubbing cyanosis or edema.  Some left shoulder pain when attempting to rotate.  Patient able to shrug. Skin: Breaks, tears or lesions Psychiatry: Patient appears appropriate, no evidence of psychoses Neurology: No focal deficits  Data Reviewed: Noted persistent hyperkalemia  Disposition:  Status is: Inpatient Remains inpatient appropriate because:  -Hyperkalemia    Anticipated discharge date: 9/28  Family Communication: We will call wife DVT Prophylaxis: Place TED hose Start: 05/03/22 1109 apixaban (ELIQUIS) tablet 5 mg    Author: Annita Brod ,MD 05/10/2022 3:06 PM  To reach On-call, see care teams to locate the attending and reach out via [email or web address]. Between 7PM-7AM, please contact night-coverage If you still have difficulty reaching the attending provider, please page the [facility] (Director on Call) for [facility] on amion for assistance.

## 2022-05-11 DIAGNOSIS — N184 Chronic kidney disease, stage 4 (severe): Secondary | ICD-10-CM | POA: Diagnosis not present

## 2022-05-11 DIAGNOSIS — I482 Chronic atrial fibrillation, unspecified: Secondary | ICD-10-CM | POA: Diagnosis not present

## 2022-05-11 DIAGNOSIS — N10 Acute tubulo-interstitial nephritis: Secondary | ICD-10-CM | POA: Diagnosis not present

## 2022-05-11 DIAGNOSIS — I5022 Chronic systolic (congestive) heart failure: Secondary | ICD-10-CM | POA: Diagnosis not present

## 2022-05-11 LAB — BASIC METABOLIC PANEL
Anion gap: 6 (ref 5–15)
BUN: 59 mg/dL — ABNORMAL HIGH (ref 8–23)
CO2: 29 mmol/L (ref 22–32)
Calcium: 8.9 mg/dL (ref 8.9–10.3)
Chloride: 95 mmol/L — ABNORMAL LOW (ref 98–111)
Creatinine, Ser: 2.56 mg/dL — ABNORMAL HIGH (ref 0.61–1.24)
GFR, Estimated: 26 mL/min — ABNORMAL LOW (ref >60)
Glucose, Bld: 97 mg/dL (ref 70–99)
Potassium: 5.7 mmol/L — ABNORMAL HIGH (ref 3.5–5.1)
Sodium: 130 mmol/L — ABNORMAL LOW (ref 135–145)

## 2022-05-11 LAB — MAGNESIUM: Magnesium: 2.4 mg/dL (ref 1.7–2.4)

## 2022-05-11 LAB — PHOSPHORUS: Phosphorus: 3.4 mg/dL (ref 2.5–4.6)

## 2022-05-11 MED ORDER — SODIUM ZIRCONIUM CYCLOSILICATE 5 G PO PACK
5.0000 g | PACK | Freq: Three times a day (TID) | ORAL | Status: DC
  Administered 2022-05-11 – 2022-05-12 (×3): 5 g via ORAL
  Filled 2022-05-11 (×4): qty 1

## 2022-05-11 MED ORDER — SODIUM CHLORIDE 0.9 % IV SOLN: INTRAVENOUS | Status: DC

## 2022-05-11 NOTE — Progress Notes (Signed)
[facility] Progress Note  Patient: Matthew [name]    LGX:[id number]  DOA: 05/03/2022    Date of Service: the patient was seen and examined on 05/11/2022  Brief hospital course: 71 year old male with past medical history of atrial fibrillation on Eliquis, systolic heart failure with ejection fraction 35 to 40%, stage IV chronic kidney disease and senile dementia who presented to the emergency room on 9/20 with left-sided abdominal pain and worsening confusion.  Patient found to have acute pyelonephritis and treated with IV fluids and antibiotics patient treated and improved.  On patient noted to have some mild hypokalemia, however since then normal.  Initial plan was to discharge patient on 9/26 when he was noted left potassium of 5.6.  Since then, patient has continued to have persistent hypokalemia.  Assessment and Plan: Assessment and Plan: * Acute pyelonephritis CT suggest pyelonephritis.  Treated with 7 days of IV Rocephin.  Senile dementia with acute confusional state, with behavioral disturbance (HCC)-resolved as of 05/10/2022 Acute delirium causes secondary to urinary tract infection/pyelonephritis.  These have been since resolved and he is back to his baseline.  PT and OT recommending short-term skilled nursing.  Hyperkalemia Unclear etiology.  Improved during the day, but then continues to peak in the morning, despite Lasix.  Have stopped Compazine and trazodone as there are reports of inconsistent electrolyte abnormalities.  Have started 3 times daily Lokelma.  Weakness Worse after infection.  Patient uses cane, walker as well as wheelchair for ambulation.  PT and OT recommending skilled nursing.  CKD (chronic kidney disease) stage 4, GFR 15-29 ml/min (HCC) Creatinine remained stable at baseline  COPD (chronic obstructive pulmonary disease) ([place]) - Patient has history of COPD/asthma.  Not sure if he uses home oxygen. - Not in any exacerbation.  Chronic systolic CHF  (congestive heart failure) ([place]) - Patient has history of chronic systolic heart failure.  His recent 2D echocardiogram dated 10/18/2021 showed left ventricular EF of 35 to 40%. -Patient not in CHF exacerbation.  BNP checked and essentially normal.  That said, has diuresed over 12 L during hospitalization.  Given worsening heart failure, have stopped Lasix and will try gentle fluids. -Continue his home medication.  Strict intake and output and daily weight check.  2 g sodium and 1500 cc fluid restricted diet.  Atrial fibrillation, chronic ([place]) - Patient has chronic A-fib. -Initial heart rate in the ED 70. -Patient currently on amiodarone, Toprol-XL as well as Lopressor.  I will wait for patient's medication to be reconciled and closely monitor his vitals on telemetry. -Continue Eliquis for anticoagulation.  Hypokalemia-resolved as of 05/10/2022 - Serum potassium 3.2. - Replace with oral potassium 40 mEq x 1 dose.  Check serum magnesium level. - Recheck BMP in AM.  Left shoulder pain Unclear length of time.  Patient states it only started since he was admitted, but states that right shoulder has been bothering him for extended period of time.  Examined and noted to have some pain with rotation and extension.  X-rays checked and patient found to have bone spurs and DJD, but no evidence of fracture.  Recommend physical therapy  Malnutrition of moderate degree Nutrition Status: Nutrition Problem: Moderate Malnutrition Etiology: chronic illness Signs/Symptoms: mild fat depletion, moderate muscle depletion, percent weight loss Percent weight loss: 18 %   By nutrition.  Placed on multivitamin supplementation plus Ensure 3 times daily.  High refeeding risk.    Obesity (BMI 30-39.9) Meets criteria BMI greater than 30  Body mass index is 30.13 kg/m.  Nutrition Problem: Moderate Malnutrition Etiology: chronic illness      Consultants: None  Procedures: None  Antimicrobials: IV Rocephin x7 days-completed  Code Status: Full code   Subjective: Doing okay, no complaints.  Objective: Noted mild bradycardia Vitals:   05/11/22 0336 05/11/22 0729  BP: (!) 146/88 121/78  Pulse: 70 61  Resp: 17 16  Temp: 98.3 F (36.8 C) 97.9 F (36.6 C)  SpO2: 98% 97%    Intake/Output Summary (Last 24 hours) at 05/11/2022 1707 Last data filed at 05/11/2022 1600 Gross per 24 hour  Intake --  Output 6445 ml  Net -6445 ml    Filed Weights   05/03/22 0409 05/03/22 1935 05/04/22 1507  Weight: 113.4 kg 105.2 kg 106.5 kg   Body mass index is 30.13 kg/m.  Exam:  General: Alert and oriented x2, no acute distress HEENT: Normocephalic, atraumatic, mucous membranes are moist Cardiovascular: Regular rate and rhythm, S1-S2 Respiratory: Clear to auscultation bilaterally Abdomen: Soft, nontender, nondistended, positive bowel sounds Musculoskeletal: No clubbing cyanosis or edema.  A little more motion in the shoulder today Skin: No skin breaks, tears or lesions Psychiatry: Patient appears appropriate, no evidence of psychoses.  Chronic underlying dementia Neurology: No focal deficits  Data Reviewed: Noted persistent hyperkalemia, renal function unchanged  Disposition:  Status is: Inpatient Remains inpatient appropriate because:  -Hyperkalemia    Anticipated discharge date: 9/29  Family Communication: We will call wife DVT Prophylaxis: Place TED hose Start: 05/03/22 1109 apixaban (ELIQUIS) tablet 5 mg    Author: Annita Brod ,MD 05/11/2022 5:07 PM  To reach On-call, see care teams to locate the attending and reach out via [email or web address]. Between 7PM-7AM, please contact night-coverage If you still have difficulty reaching the attending provider, please page the [facility] (Director on Call) for [facility] on amion for assistance.
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
## 2022-05-11 NOTE — Plan of Care (Signed)

## 2022-05-11 NOTE — TOC Progression Note (Signed)
Transition of Care [facility]) - Progression Note    Patient Details  Name: Matthew [name] MRN: [id number] Date of Birth: 1951-08-13  Transition of Care [facility]) CM/SW Contact  Beverly Sessions, RN Phone Number: 05/11/2022, 4:13 PM  Clinical Narrative:    MD request for auth to be restarted as it expired today Auth started in navi portal [facility] at [facility] updated         Expected Discharge Plan and Services                                                 Social Determinants of Health (SDOH) Interventions Food Insecurity Interventions: Patient Refused Housing Interventions: Patient Refused Transportation Interventions: Patient Refused Utilities Interventions: Patient Refused  Readmission Risk Interventions    05/04/2022   10:42 AM 10/18/2021   12:39 PM  Readmission Risk Prevention Plan  Transportation Screening  Complete  HRI or Home Care Consult Complete   Palliative Care Screening Complete   Medication Review (RN Care Manager) Complete Complete  PCP or Specialist appointment within 3-5 days of discharge  Complete  HRI or [place]  Complete  SW Recovery Care/Counseling Consult  Complete  [place]  Not Applicable

## 2022-05-11 NOTE — TOC Progression Note (Signed)
Transition of Care [facility]) - Progression Note    Patient Details  Name: Matthew [name] MRN: [id number] Date of Birth: [birth year]  Transition of Care [facility]) CM/SW Contact  Beverly Sessions, RN Phone Number: 05/11/2022, 11:43 AM  Clinical Narrative:      K 5.7  Message sent to MD for update on discharge plan      Expected Discharge Plan and Services                                                 Social Determinants of Health (SDOH) Interventions Food Insecurity Interventions: Patient Refused Housing Interventions: Patient Refused Transportation Interventions: Patient Refused Utilities Interventions: Patient Refused  Readmission Risk Interventions    05/04/2022   10:42 AM 10/18/2021   12:39 PM  Readmission Risk Prevention Plan  Transportation Screening  Complete  HRI or Home Care Consult Complete   Palliative Care Screening Complete   Medication Review ([place]) Complete Complete  PCP or Specialist appointment within 3-5 days of discharge  Complete  HRI or [place]  Complete  SW Recovery Care/Counseling Consult  Complete  [place]  Not Applicable

## 2022-05-12 DIAGNOSIS — F03918 Unspecified dementia, unspecified severity, with other behavioral disturbance: Secondary | ICD-10-CM

## 2022-05-12 DIAGNOSIS — F05 Delirium due to known physiological condition: Secondary | ICD-10-CM

## 2022-05-12 DIAGNOSIS — E871 Hypo-osmolality and hyponatremia: Secondary | ICD-10-CM

## 2022-05-12 DIAGNOSIS — E875 Hyperkalemia: Secondary | ICD-10-CM | POA: Diagnosis not present

## 2022-05-12 DIAGNOSIS — N10 Acute tubulo-interstitial nephritis: Secondary | ICD-10-CM | POA: Diagnosis not present

## 2022-05-12 DIAGNOSIS — N184 Chronic kidney disease, stage 4 (severe): Secondary | ICD-10-CM | POA: Diagnosis not present

## 2022-05-12 LAB — BASIC METABOLIC PANEL
Anion gap: 5 (ref 5–15)
BUN: 62 mg/dL — ABNORMAL HIGH (ref 8–23)
CO2: 30 mmol/L (ref 22–32)
Calcium: 9.4 mg/dL (ref 8.9–10.3)
Chloride: 96 mmol/L — ABNORMAL LOW (ref 98–111)
Creatinine, Ser: 2.7 mg/dL — ABNORMAL HIGH (ref 0.61–1.24)
GFR, Estimated: 24 mL/min — ABNORMAL LOW (ref >60)
Glucose, Bld: 96 mg/dL (ref 70–99)
Potassium: 4.9 mmol/L (ref 3.5–5.1)
Sodium: 131 mmol/L — ABNORMAL LOW (ref 135–145)

## 2022-05-12 MED ORDER — SODIUM ZIRCONIUM CYCLOSILICATE 5 G PO PACK
5.0000 g | PACK | Freq: Two times a day (BID) | ORAL | Status: DC
  Administered 2022-05-12 – 2022-05-13 (×2): 5 g via ORAL
  Filled 2022-05-12 (×2): qty 1

## 2022-05-12 NOTE — TOC Progression Note (Signed)
Transition of Care [facility]) - Progression Note    Patient Details  Name: Matthew [name] MRN: [id number] Date of Birth: Jan 22, 1951  Transition of Care [facility]) CM/SW Contact  Beverly Sessions, RN Phone Number: 05/12/2022, 9:12 AM  Clinical Narrative:    Insurance auth pending        Expected Discharge Plan and Services                                                 Social Determinants of Health (SDOH) Interventions Food Insecurity Interventions: Patient Refused Housing Interventions: Patient Refused Transportation Interventions: Patient Refused Utilities Interventions: Patient Refused  Readmission Risk Interventions    05/04/2022   10:42 AM 10/18/2021   12:39 PM  Readmission Risk Prevention Plan  Transportation Screening  Complete  HRI or Home Care Consult Complete   Palliative Care Screening Complete   Medication Review ([place]) Complete Complete  PCP or Specialist appointment within 3-5 days of discharge  Complete  HRI or [place]  Complete  SW Recovery Care/Counseling Consult  Complete  [place]  Not Applicable

## 2022-05-12 NOTE — TOC Progression Note (Signed)
Transition of Care [facility]) - Progression Note    Patient Details  Name: Matthew [name] MRN: [id number] Date of Birth: [birth year]  Transition of Care [facility]) CM/SW Contact  Beverly Sessions, RN Phone Number: 05/12/2022, 3:02 PM  Clinical Narrative:     Candace Cruise obtained auth number [id number] valid through 10/3.  MD notified        Expected Discharge Plan and Services                                                 Social Determinants of Health (SDOH) Interventions Food Insecurity Interventions: Patient Refused Housing Interventions: Patient Refused Transportation Interventions: Patient Refused Utilities Interventions: Patient Refused  Readmission Risk Interventions    05/04/2022   10:42 AM 10/18/2021   12:39 PM  Readmission Risk Prevention Plan  Transportation Screening  Complete  HRI or Home Care Consult Complete   Palliative Care Screening Complete   Medication Review ([place]) Complete Complete  PCP or Specialist appointment within 3-5 days of discharge  Complete  HRI or [place]  Complete  SW Recovery Care/Counseling Consult  Complete  [place]  Not Applicable

## 2022-05-12 NOTE — Progress Notes (Addendum)
  Progress Note   Patient: Matthew [name] CHY:[id number] DOB: [birth year] DOA: 05/03/2022     9 DOS: the patient was seen and examined on 05/12/2022   Brief hospital course: 71 year old male with past medical history of atrial fibrillation on Eliquis, systolic heart failure with ejection fraction 35 to 40%, stage IV chronic kidney disease and senile dementia who presented to the emergency room on 9/20 with left-sided abdominal pain and worsening confusion.  Patient found to have acute pyelonephritis and treated with IV fluids and antibiotics patient treated and improved.  On patient noted to have some mild hyperkalemia, however since then normal.  Initial plan was to discharge patient on 9/26 when he was noted left potassium of 5.6.  Since then, patient has continued to have persistent hyperkalemia.  Assessment and Plan: * Acute pyelonephritis CT suggest pyelonephritis.  Treated with 7 days of IV Rocephin. Condition has improved, currently pending SNF placement.   HCKD (chronic kidney disease) stage 4, GFR 15-29 ml/min (HCC) Hyperkalemia Initially hypokalemia. Hyponatremia. Renal function stable, however, patient had persistent hyperkalemia after initial hypokalemia.  He has received 3 doses of Lokelma yesterday, will continue 5 mg twice a day for now.  Recheck level tomorrow.  Acute metabolic encephalopathy. Dementia. Reviewed patient chart, patient did have increased sleepiness with admission, consistent with acute metabolic encephalopathy. Patient also has baseline dementia, currently he does not have any agitation.  Weakness Patient insurance approval for nursing home expired, will reapply for nursing placement.   COPD (chronic obstructive pulmonary disease) (HCC) Stable without exacerbation.   Chronic systolic CHF (congestive heart failure) ([place]) - Patient has history of chronic systolic heart failure.  His recent 2D echocardiogram dated 10/18/2021 showed left ventricular EF of 35 to  40%. Patient does not have exacerbation of CHF, he was actually given gentle rehydration.  Continue hold diuretics, discontinue fluids.   Atrial fibrillation, chronic (HCC) Continue anticoagulation.  Left shoulder pain Osteoarthritis. Continue symptomatic treatment.   Malnutrition of moderate degree Continue protein supplements.     Subjective:  Patient feels better, currently has no complaints.  Physical Exam: Vitals:   05/11/22 1736 05/11/22 2004 05/12/22 0416 05/12/22 0915  BP: 129/72 123/68 126/66 123/64  Pulse: 69 60 (!) 59 (!) 51  Resp: '17 17 17 18  '$ Temp: 97.7 F (36.5 C) 98.2 F (36.8 C) 98 F (36.7 C) 97.7 F (36.5 C)  TempSrc:      SpO2: 97% 95% 98% 100%  Weight:      Height:       General exam: Appears calm and comfortable  Respiratory system: Clear to auscultation. Respiratory effort normal. Cardiovascular system: Irregular. No JVD, murmurs, rubs, gallops or clicks. No pedal edema. Gastrointestinal system: Abdomen is nondistended, soft and nontender. No organomegaly or masses felt. Normal bowel sounds heard. Central nervous system: Alert and oriented. No focal neurological deficits. Extremities: Symmetric 5 x 5 power. Skin: No rashes, lesions or ulcers Psychiatry: Judgement and insight appear normal. Mood & affect appropriate.   Data Reviewed:  Lab results reviewed.  Family Communication:   Disposition: Status is: Inpatient Remains inpatient appropriate because: Severity of disease, pending nursing home placement.  Planned Discharge Destination: Skilled nursing facility    Time spent: 35 minutes  Author: Sharen Hones, MD 05/12/2022 1:21 PM  For on call review [email or web address].

## 2022-05-12 NOTE — TOC Progression Note (Signed)
Transition of Care [facility]) - Progression Note    Patient Details  Name: Matthew [name] MRN: [id number] Date of Birth: [birth year]  Transition of Care [facility]) CM/SW Contact  Beverly Sessions, RN Phone Number: 05/12/2022, 4:06 PM  Clinical Narrative:    Per MD plan to DC tomorrow. Per Lavella Lemons with [facility] they should be able to accept tomorrow.  TOC to follow up and confirm          Expected Discharge Plan and Services                                                 Social Determinants of Health (SDOH) Interventions Food Insecurity Interventions: Patient Refused Housing Interventions: Patient Refused Transportation Interventions: Patient Refused Utilities Interventions: Patient Refused  Readmission Risk Interventions    05/04/2022   10:42 AM 10/18/2021   12:39 PM  Readmission Risk Prevention Plan  Transportation Screening  Complete  HRI or Home Care Consult Complete   Palliative Care Screening Complete   Medication Review ([place]) Complete Complete  PCP or Specialist appointment within 3-5 days of discharge  Complete  HRI or [place]  Complete  SW Recovery Care/Counseling Consult  Complete  [place]  Not Applicable
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
## 2022-05-12 NOTE — Care Management Important Message (Signed)
Important Message  Patient Details  Name: Matthew [name] MRN: [id number] Date of Birth: [birth year]   Medicare Important Message Given:  Yes  Reviewed Medicare IM with patient via room phone due to isolation status.  Copy to be delivered to room via nursing staff.  Attempted to reach wife to review Medicare IM at [phone], no answer upon attempt and unable to leave a voicemail.      Dannette Barbara 05/12/2022, 1:27 PM

## 2022-05-13 DIAGNOSIS — M25511 Pain in right shoulder: Secondary | ICD-10-CM | POA: Diagnosis not present

## 2022-05-13 DIAGNOSIS — R770 Abnormality of albumin: Secondary | ICD-10-CM | POA: Diagnosis not present

## 2022-05-13 DIAGNOSIS — I5022 Chronic systolic (congestive) heart failure: Secondary | ICD-10-CM | POA: Diagnosis not present

## 2022-05-13 DIAGNOSIS — G4733 Obstructive sleep apnea (adult) (pediatric): Secondary | ICD-10-CM | POA: Diagnosis not present

## 2022-05-13 DIAGNOSIS — I517 Cardiomegaly: Secondary | ICD-10-CM | POA: Diagnosis not present

## 2022-05-13 DIAGNOSIS — N2581 Secondary hyperparathyroidism of renal origin: Secondary | ICD-10-CM | POA: Diagnosis not present

## 2022-05-13 DIAGNOSIS — R531 Weakness: Secondary | ICD-10-CM | POA: Diagnosis not present

## 2022-05-13 DIAGNOSIS — I428 Other cardiomyopathies: Secondary | ICD-10-CM | POA: Diagnosis not present

## 2022-05-13 DIAGNOSIS — Z7401 Bed confinement status: Secondary | ICD-10-CM | POA: Diagnosis not present

## 2022-05-13 DIAGNOSIS — R202 Paresthesia of skin: Secondary | ICD-10-CM | POA: Diagnosis not present

## 2022-05-13 DIAGNOSIS — R251 Tremor, unspecified: Secondary | ICD-10-CM | POA: Diagnosis not present

## 2022-05-13 DIAGNOSIS — R7989 Other specified abnormal findings of blood chemistry: Secondary | ICD-10-CM | POA: Diagnosis not present

## 2022-05-13 DIAGNOSIS — E669 Obesity, unspecified: Secondary | ICD-10-CM | POA: Diagnosis not present

## 2022-05-13 DIAGNOSIS — E440 Moderate protein-calorie malnutrition: Secondary | ICD-10-CM | POA: Diagnosis not present

## 2022-05-13 DIAGNOSIS — F32A Depression, unspecified: Secondary | ICD-10-CM | POA: Diagnosis not present

## 2022-05-13 DIAGNOSIS — I5040 Unspecified combined systolic (congestive) and diastolic (congestive) heart failure: Secondary | ICD-10-CM | POA: Diagnosis not present

## 2022-05-13 DIAGNOSIS — M6281 Muscle weakness (generalized): Secondary | ICD-10-CM | POA: Diagnosis not present

## 2022-05-13 DIAGNOSIS — M25562 Pain in left knee: Secondary | ICD-10-CM | POA: Diagnosis not present

## 2022-05-13 DIAGNOSIS — R5381 Other malaise: Secondary | ICD-10-CM | POA: Diagnosis not present

## 2022-05-13 DIAGNOSIS — R609 Edema, unspecified: Secondary | ICD-10-CM | POA: Diagnosis not present

## 2022-05-13 DIAGNOSIS — N10 Acute tubulo-interstitial nephritis: Secondary | ICD-10-CM | POA: Diagnosis not present

## 2022-05-13 DIAGNOSIS — Z789 Other specified health status: Secondary | ICD-10-CM | POA: Diagnosis not present

## 2022-05-13 DIAGNOSIS — M6259 Muscle wasting and atrophy, not elsewhere classified, multiple sites: Secondary | ICD-10-CM | POA: Diagnosis not present

## 2022-05-13 DIAGNOSIS — J449 Chronic obstructive pulmonary disease, unspecified: Secondary | ICD-10-CM | POA: Diagnosis not present

## 2022-05-13 DIAGNOSIS — G4700 Insomnia, unspecified: Secondary | ICD-10-CM | POA: Diagnosis not present

## 2022-05-13 DIAGNOSIS — D631 Anemia in chronic kidney disease: Secondary | ICD-10-CM | POA: Diagnosis not present

## 2022-05-13 DIAGNOSIS — I4891 Unspecified atrial fibrillation: Secondary | ICD-10-CM | POA: Diagnosis not present

## 2022-05-13 DIAGNOSIS — F05 Delirium due to known physiological condition: Secondary | ICD-10-CM | POA: Diagnosis not present

## 2022-05-13 DIAGNOSIS — F03918 Unspecified dementia, unspecified severity, with other behavioral disturbance: Secondary | ICD-10-CM | POA: Diagnosis not present

## 2022-05-13 DIAGNOSIS — I482 Chronic atrial fibrillation, unspecified: Secondary | ICD-10-CM | POA: Diagnosis not present

## 2022-05-13 DIAGNOSIS — E875 Hyperkalemia: Secondary | ICD-10-CM | POA: Diagnosis not present

## 2022-05-13 DIAGNOSIS — I10 Essential (primary) hypertension: Secondary | ICD-10-CM | POA: Diagnosis not present

## 2022-05-13 DIAGNOSIS — R5383 Other fatigue: Secondary | ICD-10-CM | POA: Diagnosis not present

## 2022-05-13 DIAGNOSIS — E119 Type 2 diabetes mellitus without complications: Secondary | ICD-10-CM | POA: Diagnosis not present

## 2022-05-13 DIAGNOSIS — D649 Anemia, unspecified: Secondary | ICD-10-CM | POA: Diagnosis not present

## 2022-05-13 DIAGNOSIS — N184 Chronic kidney disease, stage 4 (severe): Secondary | ICD-10-CM | POA: Diagnosis not present

## 2022-05-13 LAB — BASIC METABOLIC PANEL
Anion gap: 6 (ref 5–15)
BUN: 67 mg/dL — ABNORMAL HIGH (ref 8–23)
CO2: 30 mmol/L (ref 22–32)
Calcium: 9 mg/dL (ref 8.9–10.3)
Chloride: 97 mmol/L — ABNORMAL LOW (ref 98–111)
Creatinine, Ser: 2.59 mg/dL — ABNORMAL HIGH (ref 0.61–1.24)
GFR, Estimated: 26 mL/min — ABNORMAL LOW (ref >60)
Glucose, Bld: 92 mg/dL (ref 70–99)
Potassium: 4.9 mmol/L (ref 3.5–5.1)
Sodium: 133 mmol/L — ABNORMAL LOW (ref 135–145)

## 2022-05-13 MED ORDER — POLYETHYLENE GLYCOL 3350 17 G PO PACK: 17.0000 g | PACK | Freq: Every day | ORAL | 0 refills | Status: DC | PRN

## 2022-05-13 MED ORDER — SODIUM ZIRCONIUM CYCLOSILICATE 5 G PO PACK: 5.0000 g | PACK | Freq: Two times a day (BID) | ORAL | 0 refills | Status: AC

## 2022-05-13 NOTE — TOC Transition Note (Addendum)
Transition of Care [facility]) - CM/SW Discharge Note   Patient Details  Name: Matthew [name] MRN: [id number] Date of Birth: 04-Feb-1951  Transition of Care [facility]) CM/SW Contact:  Elliot Gurney [place], [place] Phone Number: 05/13/2022, 11:08 AM   Clinical Narrative:     Patient to discharge to [facility] by [facility]. [facility] called at 11:08am. Patient is second on list to be picked up. Discharge summary sent through the Hub to [facility]. RN to call in report to [phone].  Patient will be going to room 27.   [name] [address] LCSW Transition of Care [phone]         Patient Goals and CMS Choice        Discharge Placement                       Discharge Plan and Services                                     Social Determinants of Health (SDOH) Interventions Food Insecurity Interventions: Patient Refused Housing Interventions: Patient Refused Transportation Interventions: Patient Refused Utilities Interventions: Patient Refused   Readmission Risk Interventions    05/04/2022   10:42 AM 10/18/2021   12:39 PM  Readmission Risk Prevention Plan  Transportation Screening  Complete  HRI or Home Care Consult Complete   Palliative Care Screening Complete   Medication Review ([place]) Complete Complete  PCP or Specialist appointment within 3-5 days of discharge  Complete  HRI or [place]  Complete  SW Recovery Care/Counseling Consult  Complete  [place]  Not Applicable

## 2022-05-13 NOTE — TOC Progression Note (Signed)
Transition of Care [facility]) - Progression Note    Patient Details  Name: PETROS AHART MRN: [id number] Date of Birth: Mar 25, 1951  Transition of Care [facility]) CM/SW Contact  Graciano Batson, [place], [place] Phone Number: 05/13/2022, 8:52 AM  Clinical Narrative:     Phone call to Lavella Lemons at [facility], left message with her and DON Crystal regarding transfer today.  Roe Wilner, LCSW Transition of Care [phone]        Expected Discharge Plan and Services                                                 Social Determinants of Health (SDOH) Interventions Food Insecurity Interventions: Patient Refused Housing Interventions: Patient Refused Transportation Interventions: Patient Refused Utilities Interventions: Patient Refused  Readmission Risk Interventions    05/04/2022   10:42 AM 10/18/2021   12:39 PM  Readmission Risk Prevention Plan  Transportation Screening  Complete  HRI or Home Care Consult Complete   Palliative Care Screening Complete   Medication Review ([place]) Complete Complete  PCP or Specialist appointment within 3-5 days of discharge  Complete  HRI or [place]  Complete  SW Recovery Care/Counseling Consult  Complete  [place]  Not Applicable
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
## 2022-05-15 DIAGNOSIS — R5381 Other malaise: Secondary | ICD-10-CM | POA: Diagnosis not present

## 2022-05-16 DIAGNOSIS — E875 Hyperkalemia: Secondary | ICD-10-CM | POA: Diagnosis not present

## 2022-05-16 DIAGNOSIS — I4891 Unspecified atrial fibrillation: Secondary | ICD-10-CM | POA: Diagnosis not present

## 2022-05-16 DIAGNOSIS — E669 Obesity, unspecified: Secondary | ICD-10-CM | POA: Diagnosis not present

## 2022-05-16 DIAGNOSIS — I10 Essential (primary) hypertension: Secondary | ICD-10-CM | POA: Diagnosis not present

## 2022-05-16 DIAGNOSIS — E119 Type 2 diabetes mellitus without complications: Secondary | ICD-10-CM | POA: Diagnosis not present

## 2022-05-16 DIAGNOSIS — M25562 Pain in left knee: Secondary | ICD-10-CM | POA: Diagnosis not present

## 2022-05-16 DIAGNOSIS — Z789 Other specified health status: Secondary | ICD-10-CM | POA: Diagnosis not present

## 2022-05-16 DIAGNOSIS — F32A Depression, unspecified: Secondary | ICD-10-CM | POA: Diagnosis not present

## 2022-05-16 DIAGNOSIS — I5022 Chronic systolic (congestive) heart failure: Secondary | ICD-10-CM | POA: Diagnosis not present

## 2022-05-16 DIAGNOSIS — I482 Chronic atrial fibrillation, unspecified: Secondary | ICD-10-CM | POA: Diagnosis not present

## 2022-05-17 DIAGNOSIS — D649 Anemia, unspecified: Secondary | ICD-10-CM | POA: Diagnosis not present

## 2022-05-17 DIAGNOSIS — R7989 Other specified abnormal findings of blood chemistry: Secondary | ICD-10-CM | POA: Diagnosis not present

## 2022-05-17 DIAGNOSIS — R770 Abnormality of albumin: Secondary | ICD-10-CM | POA: Diagnosis not present

## 2022-05-17 DIAGNOSIS — N184 Chronic kidney disease, stage 4 (severe): Secondary | ICD-10-CM | POA: Diagnosis not present

## 2022-05-17 DIAGNOSIS — I517 Cardiomegaly: Secondary | ICD-10-CM | POA: Diagnosis not present

## 2022-05-18 DIAGNOSIS — R251 Tremor, unspecified: Secondary | ICD-10-CM | POA: Diagnosis not present

## 2022-05-18 DIAGNOSIS — R531 Weakness: Secondary | ICD-10-CM | POA: Diagnosis not present

## 2022-05-18 DIAGNOSIS — R202 Paresthesia of skin: Secondary | ICD-10-CM | POA: Diagnosis not present

## 2022-05-18 DIAGNOSIS — G4700 Insomnia, unspecified: Secondary | ICD-10-CM | POA: Diagnosis not present

## 2022-05-19 DIAGNOSIS — R251 Tremor, unspecified: Secondary | ICD-10-CM | POA: Diagnosis not present

## 2022-05-19 DIAGNOSIS — R202 Paresthesia of skin: Secondary | ICD-10-CM | POA: Diagnosis not present

## 2022-05-19 DIAGNOSIS — R5383 Other fatigue: Secondary | ICD-10-CM | POA: Diagnosis not present

## 2022-05-19 DIAGNOSIS — N184 Chronic kidney disease, stage 4 (severe): Secondary | ICD-10-CM | POA: Diagnosis not present

## 2022-05-22 DIAGNOSIS — M6281 Muscle weakness (generalized): Secondary | ICD-10-CM | POA: Diagnosis not present

## 2022-05-22 DIAGNOSIS — F32A Depression, unspecified: Secondary | ICD-10-CM | POA: Diagnosis not present

## 2022-05-22 DIAGNOSIS — I10 Essential (primary) hypertension: Secondary | ICD-10-CM | POA: Diagnosis not present

## 2022-05-23 DIAGNOSIS — I482 Chronic atrial fibrillation, unspecified: Secondary | ICD-10-CM | POA: Diagnosis not present

## 2022-05-23 DIAGNOSIS — E669 Obesity, unspecified: Secondary | ICD-10-CM | POA: Diagnosis not present

## 2022-05-23 DIAGNOSIS — I10 Essential (primary) hypertension: Secondary | ICD-10-CM | POA: Diagnosis not present

## 2022-05-23 DIAGNOSIS — I5022 Chronic systolic (congestive) heart failure: Secondary | ICD-10-CM | POA: Diagnosis not present

## 2022-05-24 DIAGNOSIS — D649 Anemia, unspecified: Secondary | ICD-10-CM | POA: Diagnosis not present

## 2022-05-24 DIAGNOSIS — M25511 Pain in right shoulder: Secondary | ICD-10-CM | POA: Diagnosis not present

## 2022-05-24 DIAGNOSIS — I4891 Unspecified atrial fibrillation: Secondary | ICD-10-CM | POA: Diagnosis not present

## 2022-05-29 DIAGNOSIS — M6281 Muscle weakness (generalized): Secondary | ICD-10-CM | POA: Diagnosis not present

## 2022-05-29 DIAGNOSIS — R609 Edema, unspecified: Secondary | ICD-10-CM | POA: Diagnosis not present

## 2022-05-29 DIAGNOSIS — D649 Anemia, unspecified: Secondary | ICD-10-CM | POA: Diagnosis not present

## 2022-05-30 DIAGNOSIS — N2581 Secondary hyperparathyroidism of renal origin: Secondary | ICD-10-CM | POA: Diagnosis not present

## 2022-05-30 DIAGNOSIS — I5022 Chronic systolic (congestive) heart failure: Secondary | ICD-10-CM | POA: Diagnosis not present

## 2022-05-30 DIAGNOSIS — N184 Chronic kidney disease, stage 4 (severe): Secondary | ICD-10-CM | POA: Diagnosis not present

## 2022-05-30 DIAGNOSIS — D631 Anemia in chronic kidney disease: Secondary | ICD-10-CM | POA: Diagnosis not present

## 2022-05-31 DIAGNOSIS — I10 Essential (primary) hypertension: Secondary | ICD-10-CM | POA: Diagnosis not present

## 2022-05-31 DIAGNOSIS — M6281 Muscle weakness (generalized): Secondary | ICD-10-CM | POA: Diagnosis not present

## 2022-05-31 DIAGNOSIS — I482 Chronic atrial fibrillation, unspecified: Secondary | ICD-10-CM | POA: Diagnosis not present

## 2022-05-31 DIAGNOSIS — I5022 Chronic systolic (congestive) heart failure: Secondary | ICD-10-CM | POA: Diagnosis not present

## 2022-05-31 DIAGNOSIS — M25562 Pain in left knee: Secondary | ICD-10-CM | POA: Diagnosis not present

## 2022-05-31 DIAGNOSIS — M25511 Pain in right shoulder: Secondary | ICD-10-CM | POA: Diagnosis not present

## 2022-05-31 DIAGNOSIS — E669 Obesity, unspecified: Secondary | ICD-10-CM | POA: Diagnosis not present

## 2022-06-02 DIAGNOSIS — N184 Chronic kidney disease, stage 4 (severe): Secondary | ICD-10-CM | POA: Diagnosis not present

## 2022-06-02 DIAGNOSIS — I10 Essential (primary) hypertension: Secondary | ICD-10-CM | POA: Diagnosis not present

## 2022-06-02 DIAGNOSIS — M6281 Muscle weakness (generalized): Secondary | ICD-10-CM | POA: Diagnosis not present

## 2022-06-02 DIAGNOSIS — D649 Anemia, unspecified: Secondary | ICD-10-CM | POA: Diagnosis not present

## 2022-06-02 DIAGNOSIS — I4891 Unspecified atrial fibrillation: Secondary | ICD-10-CM | POA: Diagnosis not present

## 2022-06-02 DIAGNOSIS — G4700 Insomnia, unspecified: Secondary | ICD-10-CM | POA: Diagnosis not present

## 2022-06-19 ENCOUNTER — Other Ambulatory Visit: Payer: Self-pay | Admitting: Internal Medicine

## 2022-06-25 ENCOUNTER — Other Ambulatory Visit: Payer: Self-pay

## 2022-06-25 ENCOUNTER — Emergency Department: Payer: Medicare HMO

## 2022-06-25 ENCOUNTER — Inpatient Hospital Stay
Admission: EM | Admit: 2022-06-25 | Discharge: 2022-07-03 | DRG: 554 | Disposition: A | Discharge status: Skilled Nursing Facility | Payer: Medicare HMO | Attending: Internal Medicine | Admitting: Internal Medicine

## 2022-06-25 DIAGNOSIS — F0390 Unspecified dementia without behavioral disturbance: Secondary | ICD-10-CM | POA: Diagnosis not present

## 2022-06-25 DIAGNOSIS — R630 Anorexia: Secondary | ICD-10-CM | POA: Diagnosis present

## 2022-06-25 DIAGNOSIS — I130 Hypertensive heart and chronic kidney disease with heart failure and stage 1 through stage 4 chronic kidney disease, or unspecified chronic kidney disease: Secondary | ICD-10-CM | POA: Diagnosis present

## 2022-06-25 DIAGNOSIS — N184 Chronic kidney disease, stage 4 (severe): Secondary | ICD-10-CM | POA: Diagnosis present

## 2022-06-25 DIAGNOSIS — I959 Hypotension, unspecified: Secondary | ICD-10-CM | POA: Diagnosis not present

## 2022-06-25 DIAGNOSIS — E669 Obesity, unspecified: Secondary | ICD-10-CM | POA: Diagnosis present

## 2022-06-25 DIAGNOSIS — G4733 Obstructive sleep apnea (adult) (pediatric): Secondary | ICD-10-CM | POA: Diagnosis present

## 2022-06-25 DIAGNOSIS — F32A Depression, unspecified: Secondary | ICD-10-CM | POA: Diagnosis present

## 2022-06-25 DIAGNOSIS — Z885 Allergy status to narcotic agent status: Secondary | ICD-10-CM

## 2022-06-25 DIAGNOSIS — Z825 Family history of asthma and other chronic lower respiratory diseases: Secondary | ICD-10-CM

## 2022-06-25 DIAGNOSIS — M25462 Effusion, left knee: Secondary | ICD-10-CM | POA: Diagnosis not present

## 2022-06-25 DIAGNOSIS — M1712 Unilateral primary osteoarthritis, left knee: Principal | ICD-10-CM

## 2022-06-25 DIAGNOSIS — J4489 Other specified chronic obstructive pulmonary disease: Secondary | ICD-10-CM | POA: Diagnosis present

## 2022-06-25 DIAGNOSIS — M25562 Pain in left knee: Secondary | ICD-10-CM | POA: Diagnosis present

## 2022-06-25 DIAGNOSIS — Z741 Need for assistance with personal care: Secondary | ICD-10-CM | POA: Diagnosis not present

## 2022-06-25 DIAGNOSIS — Z79899 Other long term (current) drug therapy: Secondary | ICD-10-CM

## 2022-06-25 DIAGNOSIS — I2510 Atherosclerotic heart disease of native coronary artery without angina pectoris: Secondary | ICD-10-CM | POA: Diagnosis present

## 2022-06-25 DIAGNOSIS — M8440XA Pathological fracture, unspecified site, initial encounter for fracture: Secondary | ICD-10-CM | POA: Diagnosis not present

## 2022-06-25 DIAGNOSIS — R609 Edema, unspecified: Secondary | ICD-10-CM | POA: Diagnosis not present

## 2022-06-25 DIAGNOSIS — F419 Anxiety disorder, unspecified: Secondary | ICD-10-CM | POA: Diagnosis present

## 2022-06-25 DIAGNOSIS — D631 Anemia in chronic kidney disease: Secondary | ICD-10-CM | POA: Diagnosis not present

## 2022-06-25 DIAGNOSIS — G4730 Sleep apnea, unspecified: Secondary | ICD-10-CM | POA: Diagnosis present

## 2022-06-25 DIAGNOSIS — M25569 Pain in unspecified knee: Secondary | ICD-10-CM | POA: Diagnosis not present

## 2022-06-25 DIAGNOSIS — Z888 Allergy status to other drugs, medicaments and biological substances status: Secondary | ICD-10-CM

## 2022-06-25 DIAGNOSIS — Z515 Encounter for palliative care: Secondary | ICD-10-CM | POA: Diagnosis not present

## 2022-06-25 DIAGNOSIS — J432 Centrilobular emphysema: Secondary | ICD-10-CM | POA: Diagnosis not present

## 2022-06-25 DIAGNOSIS — J449 Chronic obstructive pulmonary disease, unspecified: Secondary | ICD-10-CM | POA: Diagnosis not present

## 2022-06-25 DIAGNOSIS — I482 Chronic atrial fibrillation, unspecified: Secondary | ICD-10-CM | POA: Diagnosis not present

## 2022-06-25 DIAGNOSIS — R0689 Other abnormalities of breathing: Secondary | ICD-10-CM | POA: Diagnosis not present

## 2022-06-25 DIAGNOSIS — R262 Difficulty in walking, not elsewhere classified: Secondary | ICD-10-CM | POA: Insufficient documentation

## 2022-06-25 DIAGNOSIS — F0393 Unspecified dementia, unspecified severity, with mood disturbance: Secondary | ICD-10-CM | POA: Diagnosis present

## 2022-06-25 DIAGNOSIS — M810 Age-related osteoporosis without current pathological fracture: Secondary | ICD-10-CM | POA: Diagnosis present

## 2022-06-25 DIAGNOSIS — I5022 Chronic systolic (congestive) heart failure: Secondary | ICD-10-CM | POA: Diagnosis not present

## 2022-06-25 DIAGNOSIS — Z7901 Long term (current) use of anticoagulants: Secondary | ICD-10-CM | POA: Diagnosis not present

## 2022-06-25 DIAGNOSIS — G8929 Other chronic pain: Secondary | ICD-10-CM | POA: Diagnosis not present

## 2022-06-25 DIAGNOSIS — Z789 Other specified health status: Secondary | ICD-10-CM

## 2022-06-25 DIAGNOSIS — Z602 Problems related to living alone: Secondary | ICD-10-CM

## 2022-06-25 DIAGNOSIS — Z7401 Bed confinement status: Secondary | ICD-10-CM | POA: Diagnosis not present

## 2022-06-25 DIAGNOSIS — I4819 Other persistent atrial fibrillation: Secondary | ICD-10-CM | POA: Diagnosis not present

## 2022-06-25 DIAGNOSIS — Z96643 Presence of artificial hip joint, bilateral: Secondary | ICD-10-CM | POA: Diagnosis present

## 2022-06-25 DIAGNOSIS — I10 Essential (primary) hypertension: Secondary | ICD-10-CM | POA: Diagnosis not present

## 2022-06-25 DIAGNOSIS — Z6832 Body mass index (BMI) 32.0-32.9, adult: Secondary | ICD-10-CM

## 2022-06-25 DIAGNOSIS — W19XXXA Unspecified fall, initial encounter: Secondary | ICD-10-CM | POA: Diagnosis not present

## 2022-06-25 DIAGNOSIS — M6281 Muscle weakness (generalized): Secondary | ICD-10-CM | POA: Diagnosis not present

## 2022-06-25 DIAGNOSIS — R001 Bradycardia, unspecified: Secondary | ICD-10-CM | POA: Diagnosis not present

## 2022-06-25 DIAGNOSIS — E440 Moderate protein-calorie malnutrition: Secondary | ICD-10-CM | POA: Diagnosis not present

## 2022-06-25 DIAGNOSIS — R41841 Cognitive communication deficit: Secondary | ICD-10-CM | POA: Diagnosis not present

## 2022-06-25 DIAGNOSIS — R5381 Other malaise: Secondary | ICD-10-CM | POA: Insufficient documentation

## 2022-06-25 LAB — COMPREHENSIVE METABOLIC PANEL
ALT: 8 U/L (ref 0–44)
AST: 12 U/L — ABNORMAL LOW (ref 15–41)
Albumin: 2.7 g/dL — ABNORMAL LOW (ref 3.5–5.0)
Alkaline Phosphatase: 89 U/L (ref 38–126)
Anion gap: 4 — ABNORMAL LOW (ref 5–15)
BUN: 27 mg/dL — ABNORMAL HIGH (ref 8–23)
CO2: 27 mmol/L (ref 22–32)
Calcium: 9.4 mg/dL (ref 8.9–10.3)
Chloride: 108 mmol/L (ref 98–111)
Creatinine, Ser: 2.22 mg/dL — ABNORMAL HIGH (ref 0.61–1.24)
GFR, Estimated: 31 mL/min — ABNORMAL LOW (ref >60)
Glucose, Bld: 117 mg/dL — ABNORMAL HIGH (ref 70–99)
Potassium: 4.6 mmol/L (ref 3.5–5.1)
Sodium: 139 mmol/L (ref 135–145)
Total Bilirubin: 0.5 mg/dL (ref 0.3–1.2)
Total Protein: 6.6 g/dL (ref 6.5–8.1)

## 2022-06-25 LAB — CBC WITH DIFFERENTIAL/PLATELET
Abs Immature Granulocytes: 0.03 10*3/uL (ref 0.00–0.07)
Basophils Absolute: 0 10*3/uL (ref 0.0–0.1)
Basophils Relative: 1 %
Eosinophils Absolute: 0.1 10*3/uL (ref 0.0–0.5)
Eosinophils Relative: 2 %
HCT: 37.6 % — ABNORMAL LOW (ref 39.0–52.0)
Hemoglobin: 11.1 g/dL — ABNORMAL LOW (ref 13.0–17.0)
Immature Granulocytes: 1 %
Lymphocytes Relative: 28 %
Lymphs Abs: 1.8 10*3/uL (ref 0.7–4.0)
MCH: 29.1 pg (ref 26.0–34.0)
MCHC: 29.5 g/dL — ABNORMAL LOW (ref 30.0–36.0)
MCV: 98.4 fL (ref 80.0–100.0)
Monocytes Absolute: 0.8 10*3/uL (ref 0.1–1.0)
Monocytes Relative: 12 %
Neutro Abs: 3.7 10*3/uL (ref 1.7–7.7)
Neutrophils Relative %: 56 %
Platelets: 256 10*3/uL (ref 150–400)
RBC: 3.82 MIL/uL — ABNORMAL LOW (ref 4.22–5.81)
RDW: 14.4 % (ref 11.5–15.5)
WBC: 6.4 10*3/uL (ref 4.0–10.5)
nRBC: 0 % (ref 0.0–0.2)

## 2022-06-25 LAB — URIC ACID: Uric Acid, Serum: 7.7 mg/dL (ref 3.7–8.6)

## 2022-06-25 LAB — SEDIMENTATION RATE: Sed Rate: 36 mm/hr — ABNORMAL HIGH (ref 0–20)

## 2022-06-25 MED ORDER — METOPROLOL SUCCINATE ER 25 MG PO TB24
12.5000 mg | ORAL_TABLET | Freq: Every day | ORAL | Status: DC
  Administered 2022-06-26 – 2022-06-28 (×2): 12.5 mg via ORAL
  Filled 2022-06-25 (×3): qty 1

## 2022-06-25 MED ORDER — FUROSEMIDE 20 MG PO TABS
20.0000 mg | ORAL_TABLET | Freq: Every day | ORAL | Status: DC
  Administered 2022-06-26 – 2022-07-03 (×8): 20 mg via ORAL
  Filled 2022-06-25 (×8): qty 1

## 2022-06-25 MED ORDER — DULOXETINE HCL 30 MG PO CPEP
60.0000 mg | ORAL_CAPSULE | Freq: Every day | ORAL | Status: DC
  Administered 2022-06-26 – 2022-07-03 (×8): 60 mg via ORAL
  Filled 2022-06-25 (×8): qty 2

## 2022-06-25 MED ORDER — POLYETHYLENE GLYCOL 3350 17 G PO PACK
17.0000 g | PACK | Freq: Every day | ORAL | Status: DC | PRN
  Administered 2022-06-28: 17 g via ORAL
  Filled 2022-06-25 (×2): qty 1

## 2022-06-25 MED ORDER — MOMETASONE FURO-FORMOTEROL FUM 200-5 MCG/ACT IN AERO
2.0000 | INHALATION_SPRAY | Freq: Two times a day (BID) | RESPIRATORY_TRACT | Status: DC
  Administered 2022-06-26 – 2022-07-03 (×15): 2 via RESPIRATORY_TRACT
  Filled 2022-06-25: qty 8.8

## 2022-06-25 MED ORDER — HYDROCODONE-ACETAMINOPHEN 5-325 MG PO TABS
1.0000 | ORAL_TABLET | ORAL | Status: DC | PRN
  Administered 2022-06-25: 1 via ORAL
  Administered 2022-06-26 – 2022-07-03 (×13): 2 via ORAL
  Filled 2022-06-25 (×5): qty 2
  Filled 2022-06-25: qty 1
  Filled 2022-06-25 (×9): qty 2

## 2022-06-25 MED ORDER — ACETAMINOPHEN 325 MG PO TABS
650.0000 mg | ORAL_TABLET | Freq: Four times a day (QID) | ORAL | Status: DC | PRN
  Administered 2022-07-03: 650 mg via ORAL
  Filled 2022-06-25 (×2): qty 2

## 2022-06-25 MED ORDER — IPRATROPIUM-ALBUTEROL 0.5-2.5 (3) MG/3ML IN SOLN: 3.0000 mL | Freq: Four times a day (QID) | RESPIRATORY_TRACT | Status: DC | PRN

## 2022-06-25 MED ORDER — MORPHINE SULFATE (PF) 4 MG/ML IV SOLN
4.0000 mg | Freq: Once | INTRAVENOUS | Status: AC
  Administered 2022-06-25: 4 mg via INTRAVENOUS
  Filled 2022-06-25: qty 1

## 2022-06-25 MED ORDER — ACETAMINOPHEN 650 MG RE SUPP: 650.0000 mg | Freq: Four times a day (QID) | RECTAL | Status: DC | PRN

## 2022-06-25 MED ORDER — ONDANSETRON HCL 4 MG/2ML IJ SOLN
4.0000 mg | Freq: Four times a day (QID) | INTRAMUSCULAR | Status: DC | PRN
  Administered 2022-06-27: 4 mg via INTRAVENOUS
  Filled 2022-06-25: qty 2

## 2022-06-25 MED ORDER — ONDANSETRON HCL 4 MG PO TABS
4.0000 mg | ORAL_TABLET | Freq: Four times a day (QID) | ORAL | Status: DC | PRN
  Administered 2022-06-30: 4 mg via ORAL
  Filled 2022-06-25: qty 1

## 2022-06-25 MED ORDER — APIXABAN 5 MG PO TABS
5.0000 mg | ORAL_TABLET | Freq: Two times a day (BID) | ORAL | Status: DC
  Administered 2022-06-25 – 2022-07-03 (×16): 5 mg via ORAL
  Filled 2022-06-25 (×16): qty 1

## 2022-06-25 MED ORDER — SODIUM CHLORIDE 0.9 % IV BOLUS
1000.0000 mL | Freq: Once | INTRAVENOUS | Status: AC
  Administered 2022-06-25: 1000 mL via INTRAVENOUS

## 2022-06-25 MED ORDER — OXYCODONE-ACETAMINOPHEN 5-325 MG PO TABS
1.0000 | ORAL_TABLET | Freq: Once | ORAL | Status: AC
  Administered 2022-06-25: 1 via ORAL
  Filled 2022-06-25: qty 1

## 2022-06-25 NOTE — ED Notes (Signed)
Daughters name ; Hanley Seamen [phone] Want social work involved.
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
## 2022-06-25 NOTE — Assessment & Plan Note (Addendum)
Anemia of CKD 4 Creatinine and hemoglobin both at baseline

## 2022-06-25 NOTE — Assessment & Plan Note (Signed)
Clinically euvolemic EF 35 to 40% in March 2023 Continue metoprolol and furosemide

## 2022-06-25 NOTE — ED Notes (Signed)
Asked for friends Number to be included [phone], name is [name]

## 2022-06-25 NOTE — Assessment & Plan Note (Signed)
Chronic anticoagulation Continue apixaban and metoprolol

## 2022-06-25 NOTE — Assessment & Plan Note (Deleted)
Hemoglobin at baseline at 11.1

## 2022-06-25 NOTE — ED Provider Notes (Signed)
[facility] Provider Note    Event Date/Time   First MD Initiated Contact with Patient 06/25/22 1532     (approximate)   History   Chief Complaint Knee Pain   HPI Matthew [name] is a 71 y.o. male, with asthma, hypertension, COPD, atrial fibrillation with RVR, CKD stage IV, depression, obesity, presents to the emergency department for evaluation of left-sided knee pain.  Patient states that his left knee has been bothering him for the past 3 days.  He states that this happens to him from time to time due to his arthritis.  Endorses some joint swelling.  Denies any recent falls or injuries.  He states that he has been unable to leave the bed for the past 3 days due to the pain.  He presents via EMS.  Denies fever/chills, myalgias, chest pain, shortness of breath, abdominal pain, cold sensation in the affected leg, numbness/tingling, nausea/vomiting, diarrhea, dysuria, weakness, or dizziness/lightheadedness.  Spoke with his sister-in-law, Bonna Gains [phone]), who expressed concern for the patient's ability to take care of himself.  She states that he is currently living by himself because his wife is in the ICU on a ventilator at this time.  Patient has reportedly been peeing and bottles and has been relying on a neighbor to bring him food occasionally.  Denies any recent abnormal behavior, however she does state that she has been told that he may be starting to have dementia.  History Limitations: No limitations.        Physical Exam  Triage Vital Signs: ED Triage Vitals  Enc Vitals Group     BP 06/25/22 1447 (!) 143/90     Pulse Rate 06/25/22 1447 81     Resp 06/25/22 1447 18     Temp 06/25/22 1447 97.8 F (36.6 C)     Temp Source 06/25/22 1447 Oral     SpO2 06/25/22 1447 100 %     Weight 06/25/22 1451 250 lb (113.4 kg)     Height 06/25/22 1451 '6\' 2"'$  (1.88 m)     Head Circumference --      Peak Flow --      Pain Score 06/25/22 1451 8     Pain  Loc --      Pain Edu? --      Excl. in [place]? --     Most recent vital signs: Vitals:   06/25/22 1900 06/25/22 1915  BP: (!) 150/91   Pulse:  (!) 53  Resp: 16   Temp: 98 F (36.7 C)   SpO2: 100% 100%    General: Awake, NAD.  Skin: Warm, dry. No rashes or lesions.  Eyes: PERRL. Conjunctivae normal.  CV: Good peripheral perfusion.  Resp: Normal effort.  Abd: Soft, non-tender. No distention.  Neuro: At baseline. No gross neurological deficits.  Musculoskeletal: Normal ROM of all extremities.  Focused Exam: Left knee is moderately swollen.  He is still able to flex his knee fully.  No osseous tenderness.  No warmth or erythema present.  PMS intact distally.  However, he is unable to ambulate on his knee due to pain.  Physical Exam    ED Results / Procedures / Treatments  Labs (all labs ordered are listed, but only abnormal results are displayed) Labs Reviewed  CBC WITH DIFFERENTIAL/PLATELET - Abnormal; Notable for the following components:      Result Value   RBC 3.82 (*)    Hemoglobin 11.1 (*)    HCT 37.6 (*)  MCHC 29.5 (*)    All other components within normal limits  COMPREHENSIVE METABOLIC PANEL - Abnormal; Notable for the following components:   Glucose, Bld 117 (*)    BUN 27 (*)    Creatinine, Ser 2.22 (*)    Albumin 2.7 (*)    AST 12 (*)    GFR, Estimated 31 (*)    Anion gap 4 (*)    All other components within normal limits  SEDIMENTATION RATE - Abnormal; Notable for the following components:   Sed Rate 36 (*)    All other components within normal limits  URIC ACID     EKG Pending.    RADIOLOGY  ED Provider Interpretation: I personally viewed and interpreted this x-ray, tricompartmental osteoarthritis in the left knee with small joint effusion.  No acute findings.  DG Knee Complete 4 Views Left  Result Date: 06/25/2022 CLINICAL DATA:  Left knee pain. EXAM: LEFT KNEE - COMPLETE 4+ VIEW COMPARISON:  05/19/2020. FINDINGS: No acute fracture.  Chronic fracture of the distal femur, with bony bridging, but residual fracture lines and displacement. This fracture was acute on the exam dated 05/19/2020. Lateral fixation plate and associated screws appear well seated, and without change from the prior radiographs. Tricompartmental joint spaced narrowing, moderate of the mediolateral compartments, with associated marginal osteophytes. Small joint effusion. Skeletal structures are diffusely demineralized. Surrounding soft tissues are unremarkable. IMPRESSION: 1. No acute fracture. 2. Chronic fracture of the distal left femur, with interval healing since the study dated 05/19/2020. No change in the left femur lateral fixation plate or associated fixation screws. 3. Tricompartmental osteoarthritis the left knee with associated small joint effusion. Electronically Signed   By: Lajean Manes M.D.   On: 06/25/2022 15:51    PROCEDURES:  Critical Care performed: N/A.  Procedures    MEDICATIONS ORDERED IN ED: Medications  oxyCODONE-acetaminophen (PERCOCET/ROXICET) 5-325 MG per tablet 1 tablet (1 tablet Oral Given 06/25/22 1711)  morphine (PF) 4 MG/ML injection 4 mg (4 mg Intravenous Given 06/25/22 1856)  sodium chloride 0.9 % bolus 1,000 mL (1,000 mLs Intravenous New Bag/Given 06/25/22 1859)     IMPRESSION / MDM / ASSESSMENT AND PLAN / ED COURSE  I reviewed the triage vital signs and the nursing notes.                              Differential diagnosis includes, but is not limited to, osteoarthritis, joint effusion, septic arthritis, gout, knee sprain, fractures.  Patient appears well, vitals within normal limits.  Currently nursing pain in the left knee.  Will treat with oxycodone/acetaminophen.  CBC shows no leukocytosis.  Anemia present 11.1, consistent with his baseline.  No thrombocytopenia.  Assessment/Plan Patient presents with left knee pain x3 days.  He appears well clinically, though does have a notably swollen left knee.  Very  low suspicion for septic joint given his history and physical exam.  His lab work-up is reassuring, no evidence of leukocytosis.  Unlikely gout given normal uric acid.  I suspect that his knee pain is likely related to the tricompartmental osteoarthritis.  He does have a small joint effusion, however his symptoms leave him unable to ambulate on his own despite analgesics here in the emergency department.  I share his sister-in-law's concern for his ability to care for himself at home at this time, especially since it is not seem like his wife will leave the ICU anytime soon.  Does not appear to  have reliable social support at home.  We will plan to admit for the time being.  Spoke to the on-call hospitalist, Dr. Damita Dunnings, who agreed to admission.  Patient's presentation is most consistent with acute complicated illness / injury requiring diagnostic workup.       FINAL CLINICAL IMPRESSION(S) / ED DIAGNOSES   Final diagnoses:  None     Rx / DC Orders   ED Discharge Orders     None        Note:  This document was prepared using Dragon voice recognition software and may include unintentional dictation errors.   Teodoro Spray, [place] 06/25/22 [place] Register    Duffy Bruce, MD 06/25/22 2241

## 2022-06-25 NOTE — ED Triage Notes (Addendum)
Pt to ED via [facility] ems, complaining of left side knee pain. Started this morning, woke up out of sleep with pain. Denies pain anywhere else. Pt unable to bear weight on left leg, uses a cane to get around. Denies any injury to leg.  Per EMS pt has a bandage on lower back dated 10/20 and was complaining of pain there and on buttocks. Pt denies this pain with this RN.

## 2022-06-25 NOTE — Assessment & Plan Note (Signed)
Not acutely exacerbated Patient has used as needed O2 in the past Continue home inhalers.  DuoNebs as needed

## 2022-06-25 NOTE — Assessment & Plan Note (Signed)
Delirium precautions 

## 2022-06-25 NOTE — ED Triage Notes (Signed)
Pt sister in law to front desk, reports concerns about pt living alone. States his wife is in ICU and is no longer able to care for him.

## 2022-06-25 NOTE — Assessment & Plan Note (Signed)
CPAP if desired 

## 2022-06-26 DIAGNOSIS — M25462 Effusion, left knee: Secondary | ICD-10-CM | POA: Diagnosis not present

## 2022-06-26 DIAGNOSIS — I4819 Other persistent atrial fibrillation: Secondary | ICD-10-CM | POA: Diagnosis not present

## 2022-06-26 DIAGNOSIS — M25562 Pain in left knee: Secondary | ICD-10-CM

## 2022-06-26 DIAGNOSIS — N184 Chronic kidney disease, stage 4 (severe): Secondary | ICD-10-CM

## 2022-06-26 DIAGNOSIS — G8929 Other chronic pain: Secondary | ICD-10-CM

## 2022-06-26 MED ORDER — ALPRAZOLAM 0.5 MG PO TABS
0.5000 mg | ORAL_TABLET | Freq: Two times a day (BID) | ORAL | Status: DC | PRN
  Administered 2022-06-27 – 2022-06-28 (×3): 0.5 mg via ORAL
  Filled 2022-06-26 (×3): qty 1

## 2022-06-26 MED ORDER — KETOROLAC TROMETHAMINE 30 MG/ML IJ SOLN
15.0000 mg | Freq: Four times a day (QID) | INTRAMUSCULAR | Status: AC
  Administered 2022-06-26 – 2022-07-01 (×18): 15 mg via INTRAVENOUS
  Filled 2022-06-26 (×17): qty 1

## 2022-06-26 NOTE — TOC Progression Note (Signed)
Transition of Care [facility]) - Progression Note    Patient Details  Name: Matthew [name] MRN: [id number] Date of Birth: 07-Apr-1951  [facility] CM/SW Contact  Laurena Slimmer, RN Phone Number: 06/26/2022, 4:31 PM  Clinical Narrative:    Spoke with patient at bedside . He is apprehensive about going to a SNF. He would prefer to go home but stated he needed time to consider a SNF. Patient's wife is currently inpatient at [facility]. He would like to stay closer to her but realizes he would not be able to remain inpatient for that reason. He is agreeable for RNCM to contact his daughter Adela Lank at [phone].  Contacted [name]. She will contact patient to discuss patient's discharge plan regarding SNF.           Expected Discharge Plan and Services                                                 Social Determinants of Health (SDOH) Interventions    Readmission Risk Interventions    05/04/2022   10:42 AM 10/18/2021   12:39 PM  Readmission Risk Prevention Plan  Transportation Screening  Complete  HRI or Home Care Consult Complete   Palliative Care Screening Complete   Medication Review ([place]) Complete Complete  PCP or Specialist appointment within 3-5 days of discharge  Complete  HRI or [place]  Complete  SW Recovery Care/Counseling Consult  Complete  [place]  Not Applicable

## 2022-06-26 NOTE — Assessment & Plan Note (Signed)
-   follow up PT/OT evals: SNF recommended

## 2022-06-26 NOTE — Progress Notes (Addendum)
Progress Note    HAMID BROOKENS   SLH:[id number]  DOB: 1951-01-08  DOA: 06/25/2022     1 PCP: [name], MD  Initial CC: left knee pain; unable to care for self at home  Hospital Course: Mr. Meech is a 71 year old male with PMH osteoarthritis, COPD, OSA not on CPAP, obesity, CKD 4, HTN, PAF, dementia, systolic CHF.  Patient presented with worsening pain and swelling in his left knee with difficulty ambulating.  He has also had poor appetite at home and not eating well as his wife has also been currently hospitalized.  He does have some help at home who has been checking on him while he has been alone.  Due to knee pain, he was brought to the hospital for further evaluation. Left knee x-rays were obtained in the ER which were negative for acute fracture.  He does have an underlying chronic fracture of the distal left femur that showed interval healing and no change to the underlying hardware.  Tricompartmental osteoarthritis was also appreciated with a small joint effusion. He was admitted for pain control and PT evaluation.  Interval History:  No events overnight.  Resting in bed when seen this morning still complaining of ongoing left knee pain and swelling.  He has also not seen his wife since she was admitted approximately 1 week ago.  He was wanting to visit with her if able while in the hospital.  Assessment and Plan: * Left knee pain with tricompartmental osteoarthritis and small joint effusion - History of distal left femur fracture, October 2021 - Underlying lateral fixation plate and screws appreciated on x-ray with no loosening -Continue pain control - Trial of Toradol for swollen left knee -Follow-up PT eval  Unable to care for self - follow up PT/OT evals  Persistent atrial fibrillation (HCC) - Continue Eliquis and metoprolol  Anemia of chronic kidney failure, stage 4 (severe) (HCC) - Baseline hemoglobin around 9 to 10 g/dL - Currently at baseline, possibly some  mild hemoconcentration  CKD (chronic kidney disease) stage 4, GFR 15-29 ml/min ([place]) - patient has history of CKD4. Baseline creat ~ 2.5, eGFR 27 - currently at baseline    Chronic systolic CHF (congestive heart failure) (HCC) Clinically euvolemic EF 35 to 40% in March 2023 Continue metoprolol and furosemide  COPD (chronic obstructive pulmonary disease) (HCC) Not acutely exacerbated Patient has used as needed O2 in the past Continue home inhalers.  DuoNebs as needed  Dementia without behavioral disturbance ([place]) Delirium precautions  OSA (obstructive sleep apnea) CPAP if desired   Old records reviewed in assessment of this patient  Antimicrobials:   DVT prophylaxis:   apixaban (ELIQUIS) tablet 5 mg   Code Status:   Code Status: Full Code  Mobility Assessment (last 72 hours)     Mobility Assessment     Row Name 06/26/22 0900 06/26/22 0833 06/25/22 2100 06/25/22 2037     Does patient have an order for bedrest or is patient medically unstable -- -- No - Continue assessment No - Continue assessment    What is the highest level of mobility based on the progressive mobility assessment? Level 3 (Stands with assist) - Balance while standing  and cannot march in place Level 3 (Stands with assist) - Balance while standing  and cannot march in place Level 5 (Walks with assist in room/hall) - Balance while stepping forward/back and can walk in room with assist - Complete Level 5 (Walks with assist in room/hall) - Balance while stepping forward/back  and can walk in room with assist - Complete             Barriers to discharge:  Disposition Plan: Pending PT eval Status is: Inpatient  Objective: Blood pressure (!) 153/85, pulse (!) 51, temperature 98 F (36.7 C), resp. rate 18, height _0  (1.88 m), weight 113.4 kg, SpO2 93 %.  Examination:  Physical Exam Constitutional:      General: He is not in acute distress.    Appearance: Normal appearance.  HENT:     Head:  Normocephalic and atraumatic.     Mouth/Throat:     Mouth: Mucous membranes are moist.  Eyes:     Extraocular Movements: Extraocular movements intact.  Cardiovascular:     Rate and Rhythm: Normal rate and regular rhythm.     Heart sounds: Normal heart sounds.  Pulmonary:     Effort: Pulmonary effort is normal. No respiratory distress.     Breath sounds: Normal breath sounds. No wheezing.  Abdominal:     General: Bowel sounds are normal. There is no distension.     Palpations: Abdomen is soft.     Tenderness: There is no abdominal tenderness.  Musculoskeletal:     Cervical back: Normal range of motion and neck supple.     Comments: Pain with passive ROM with left knee.  Left knee does show some edema compared to the right along with mild calor.  No significant erythema or signs to suggest infection  Skin:    General: Skin is warm and dry.  Neurological:     General: No focal deficit present.     Mental Status: He is alert.  Psychiatric:        Mood and Affect: Mood normal.        Behavior: Behavior normal.      Consultants:    Procedures:    Data Reviewed: Results for orders placed or performed during the hospital encounter of 06/25/22 (from the past 24 hour(s))  CBC with Differential     Status: Abnormal   Collection Time: 06/25/22  5:12 PM  Result Value Ref Range   WBC 6.4 4.0 - 10.5 K/uL   RBC 3.82 (L) 4.22 - 5.81 MIL/uL   Hemoglobin 11.1 (L) 13.0 - 17.0 g/dL   HCT 37.6 (L) 39.0 - 52.0 %   MCV 98.4 80.0 - 100.0 fL   MCH 29.1 26.0 - 34.0 pg   MCHC 29.5 (L) 30.0 - 36.0 g/dL   RDW 14.4 11.5 - 15.5 %   Platelets 256 150 - 400 K/uL   nRBC 0.0 0.0 - 0.2 %   Neutrophils Relative % 56 %   Neutro Abs 3.7 1.7 - 7.7 K/uL   Lymphocytes Relative 28 %   Lymphs Abs 1.8 0.7 - 4.0 K/uL   Monocytes Relative 12 %   Monocytes Absolute 0.8 0.1 - 1.0 K/uL   Eosinophils Relative 2 %   Eosinophils Absolute 0.1 0.0 - 0.5 K/uL   Basophils Relative 1 %   Basophils Absolute 0.0 0.0 -  0.1 K/uL   Immature Granulocytes 1 %   Abs Immature Granulocytes 0.03 0.00 - 0.07 K/uL  Comprehensive metabolic panel     Status: Abnormal   Collection Time: 06/25/22  5:12 PM  Result Value Ref Range   Sodium 139 135 - 145 mmol/L   Potassium 4.6 3.5 - 5.1 mmol/L   Chloride 108 98 - 111 mmol/L   CO2 27 22 - 32 mmol/L   Glucose, Bld 117 (  H) 70 - 99 mg/dL   BUN 27 (H) 8 - 23 mg/dL   Creatinine, Ser 2.22 (H) 0.61 - 1.24 mg/dL   Calcium 9.4 8.9 - 10.3 mg/dL   Total Protein 6.6 6.5 - 8.1 g/dL   Albumin 2.7 (L) 3.5 - 5.0 g/dL   AST 12 (L) 15 - 41 U/L   ALT 8 0 - 44 U/L   Alkaline Phosphatase 89 38 - 126 U/L   Total Bilirubin 0.5 0.3 - 1.2 mg/dL   GFR, Estimated 31 (L) >60 mL/min   Anion gap 4 (L) 5 - 15  Uric acid     Status: None   Collection Time: 06/25/22  5:12 PM  Result Value Ref Range   Uric Acid, Serum 7.7 3.7 - 8.6 mg/dL  Sedimentation rate     Status: Abnormal   Collection Time: 06/25/22  5:12 PM  Result Value Ref Range   Sed Rate 36 (H) 0 - 20 mm/hr    I have Reviewed nursing notes, Vitals, and Lab results since pt's last encounter. Pertinent lab results : see above I have ordered test including BMP, CBC, Mg I have reviewed the last note from staff over past 24 hours I have discussed pt's care plan and test results with nursing staff, case manager  Time spent: Greater than 50% of the 55 minute visit was spent in counseling/coordination of care for the patient as laid out in the A&P.    LOS: 1 day   Dwyane Dee, MD [facility] 06/26/2022, 12:47 PM

## 2022-06-26 NOTE — Assessment & Plan Note (Signed)
-   Baseline hemoglobin around 9 to 10 g/dL - Currently at baseline, possibly some mild hemoconcentration

## 2022-06-26 NOTE — Evaluation (Signed)
Physical Therapy Evaluation Patient Details Name: TREJAN BUDA MRN: [id number] DOB: 11-18-1950 Today's Date: 06/26/2022  History of Present Illness  Patient is a 71 year old male admitted from home with knee pain, unable to care for himself. History of  chronic left knee pain secondary to arthritis and old fracture of the distal femur in 05/2020 with hardware, COPD, class IV obesity, CKD stage IV, HTN, A-fib on Eliquis, dementia, systolic CHF.  Clinical Impression  Patient is agreeable to PT evaluation. He reports his spouse is currently in the hospital and he has had a hard time managing at home physically. He has a friend that has been dropping by to check on him, bring him Ensure, feed the dog, etc. He reports he has essentially been in the bed for 8-9 days now.  Today, he has some L knee pain with mobility but overall he reports has improved since he arrived in the hospital. He required physical assistance for bed mobility and transfers today. He was unable to take more than a few steps along the edge of bed due to knee pain and generalized weakness. The patient is unsafe to return home alone at this time but does not really want to go to rehab. I would recommend to maximize home health and family support if the patient declines short term rehab. PT will continue to follow to maximize independence and decrease caregiver burden.      Recommendations for follow up therapy are one component of a multi-disciplinary discharge planning process, led by the attending physician.  Recommendations may be updated based on patient status, additional functional criteria and insurance authorization.  Follow Up Recommendations Skilled nursing-short term rehab (<3 hours/day) Can patient physically be transported by private vehicle: No    Assistance Recommended at Discharge Intermittent Supervision/Assistance  Patient can return home with the following  A little help with walking and/or transfers;A little  help with bathing/dressing/bathroom;Help with stairs or ramp for entrance;Assist for transportation;Assistance with cooking/housework    Equipment Recommendations None recommended by PT  Recommendations for Other Services       Functional Status Assessment Patient has had a recent decline in their functional status and demonstrates the ability to make significant improvements in function in a reasonable and predictable amount of time.     Precautions / Restrictions Precautions Precautions: Fall Restrictions Weight Bearing Restrictions: No      Mobility  Bed Mobility Overal bed mobility: Needs Assistance Bed Mobility: Sit to Supine, Supine to Sit     Supine to sit: Min guard Sit to supine: Min assist   General bed mobility comments: assistance for LLE to return to bed. increased time required    Transfers Overall transfer level: Needs assistance Equipment used: Rolling walker (2 wheels) Transfers: Sit to/from Stand Sit to Stand: Min assist, From elevated surface           General transfer comment: lifting assistance reuqired for standing. verbal cues for anterior weight shifting and hand placement.    Ambulation/Gait Ambulation/Gait assistance: Min guard Gait Distance (Feet): 2 Feet Assistive device: Rolling walker (2 wheels)         General Gait Details: patient able to take 3-4 small side steps along edge of bed with rolling walker with Min guad for safety. patient relying heavily on rolling walker for support in standing.  standing tolerance limited by knee pain and generalized weakness  Stairs            Wheelchair Mobility    Modified  Rankin (Stroke Patients Only)       Balance Overall balance assessment: Needs assistance Sitting-balance support: Feet supported Sitting balance-Leahy Scale: Fair Sitting balance - Comments: no external support required   Standing balance support: Bilateral upper extremity supported, Reliant on assistive device  for balance Standing balance-Leahy Scale: Fair Standing balance comment: heavy reliance on rolling walker for support in standing                             Pertinent Vitals/Pain Pain Assessment Pain Assessment: 0-10 Pain Score: 8  Pain Location: L knee Pain Descriptors / Indicators: Discomfort, Sore (pulling) Pain Intervention(s): Limited activity within patient's tolerance, Monitored during session, Repositioned (asked patient about ice and he reports he hates ice)    Home Living Family/patient expects to be discharged to:: Private residence Living Arrangements: Spouse/significant other Available Help at Discharge: Friend(s);Family (has a friend that has been checking in, feeding the dog, etc.) Type of Home: Mobile home Home Access: Stairs to enter Entrance Stairs-Rails: Right;Left;Can reach both Entrance Stairs-Number of Steps: 4   Home Layout: One level Home Equipment: Conservation officer, nature (2 wheels);Cane - single point;BSC/3in1;Wheelchair - manual (adjustable bed) Additional Comments: spouse is currently a patient in the hospital too per patient report    Prior Function Prior Level of Function : Independent/Modified Independent             Mobility Comments: limited distance with cane or rolling walker. since spouse has been in the hospital (8 or 9 days) has been mostly in the bed ADLs Comments: spouse assist intermittently but she has been in the hospital. a friend has been bringing him ensure and he has been using bed side commode     Hand Dominance        Extremity/Trunk Assessment   Upper Extremity Assessment Upper Extremity Assessment: Generalized weakness    Lower Extremity Assessment Lower Extremity Assessment: Generalized weakness (mild left knee swelling noted)       Communication   Communication: No difficulties  Cognition Arousal/Alertness: Awake/alert Behavior During Therapy: WFL for tasks assessed/performed Overall Cognitive Status:  Within Functional Limits for tasks assessed                                          General Comments      Exercises     Assessment/Plan    PT Assessment Patient needs continued PT services  PT Problem List Decreased strength;Decreased range of motion;Decreased activity tolerance;Decreased balance;Decreased mobility;Pain       PT Treatment Interventions DME instruction;Gait training;Stair training;Functional mobility training;Therapeutic activities;Therapeutic exercise;Balance training;Neuromuscular re-education;Cognitive remediation;Patient/family education;Wheelchair mobility training    PT Goals (Current goals can be found in the Care Plan section)  Acute Rehab PT Goals Patient Stated Goal: to be able to see his wife and go home PT Goal Formulation: With patient Time For Goal Achievement: 07/10/22 Potential to Achieve Goals: Fair    Frequency Min 2X/week     Co-evaluation               AM-PAC PT "6 Clicks" Mobility  Outcome Measure Help needed turning from your back to your side while in a flat bed without using bedrails?: A Little Help needed moving from lying on your back to sitting on the side of a flat bed without using bedrails?: A Little Help needed moving to and  from a bed to a chair (including a wheelchair)?: A Lot Help needed standing up from a chair using your arms (e.g., wheelchair or bedside chair)?: A Lot Help needed to walk in hospital room?: A Lot Help needed climbing 3-5 steps with a railing? : A Lot 6 Click Score: 14    End of Session   Activity Tolerance: Patient tolerated treatment well Patient left: in bed;with call bell/phone within reach;with bed alarm set Nurse Communication: Mobility status PT Visit Diagnosis: Unsteadiness on feet (R26.81);Muscle weakness (generalized) (M62.81);Pain Pain - Right/Left: Left Pain - part of body: Knee    Time: 0093-8182 PT Time Calculation (min) (ACUTE ONLY): 23 min   Charges:   PT  Evaluation $PT Eval Low Complexity: 1 Low PT Treatments $Therapeutic Activity: 8-22 mins        Minna Merritts, PT, MPT   Percell Locus 06/26/2022, 9:21 AM

## 2022-06-26 NOTE — Hospital Course (Signed)
Matthew [name] is a 71 year old male with PMH osteoarthritis, COPD, OSA not on CPAP, obesity, CKD 4, HTN, PAF, dementia, systolic CHF.  Patient presented with worsening pain and swelling in his left knee with difficulty ambulating.  He has also had poor appetite at home and not eating well as his wife has also been currently hospitalized.  He does have some help at home who has been checking on him while he has been alone.  Due to knee pain, he was brought to the hospital for further evaluation. Left knee x-rays were obtained in the ER which were negative for acute fracture.  He does have an underlying chronic fracture of the distal left femur that showed interval healing and no change to the underlying hardware.  Tricompartmental osteoarthritis was also appreciated with a small joint effusion. He was admitted for pain control and PT evaluation.

## 2022-06-27 DIAGNOSIS — M25462 Effusion, left knee: Secondary | ICD-10-CM | POA: Diagnosis not present

## 2022-06-27 DIAGNOSIS — Z789 Other specified health status: Secondary | ICD-10-CM | POA: Diagnosis not present

## 2022-06-27 NOTE — Plan of Care (Signed)

## 2022-06-27 NOTE — Progress Notes (Addendum)
Progress Note    CORREY WEIDNER   DJS:[id number]  DOB: [birth year]  DOA: 06/25/2022     2 PCP: [name], MD  Initial CC: left knee pain; unable to care for self at home  Hospital Course: Mr. Venable is a 71 year old male with PMH osteoarthritis, COPD, OSA not on CPAP, obesity, CKD 4, HTN, PAF, dementia, systolic CHF.  Patient presented with worsening pain and swelling in his left knee with difficulty ambulating.  He has also had poor appetite at home and not eating well as his wife has also been currently hospitalized.  He does have some help at home who has been checking on him while he has been alone.  Due to knee pain, he was brought to the hospital for further evaluation. Left knee x-rays were obtained in the ER which were negative for acute fracture.  He does have an underlying chronic fracture of the distal left femur that showed interval healing and no change to the underlying hardware.  Tricompartmental osteoarthritis was also appreciated with a small joint effusion. He was admitted for pain control and PT evaluation.  Interval History:  No events overnight.  Still ongoing left knee pain but slightly improved.  He was able to visit with his wife in the ICU yesterday.  He understands recommendation is for rehab at discharge.  Assessment and Plan: * Osteoarthritis of left knee - History of distal left femur fracture, October 2021 - Underlying lateral fixation plate and screws appreciated on x-ray with no loosening -Continue pain control - Trial of Toradol for swollen left knee - PT recommending SNF  Physical deconditioning - follow up PT/OT evals: SNF recommended  Persistent atrial fibrillation (HCC) - Continue Eliquis and metoprolol  Anemia of chronic kidney failure, stage 4 (severe) (HCC) - Baseline hemoglobin around 9 to 10 g/dL - Currently at baseline, possibly some mild hemoconcentration  CKD (chronic kidney disease) stage 4, GFR 15-29 ml/min (HCC) - patient has  history of CKD4. Baseline creat ~ 2.5, eGFR 27 - currently at baseline    Chronic systolic CHF (congestive heart failure) (HCC) Clinically euvolemic EF 35 to 40% in March 2023 Continue metoprolol and furosemide  COPD (chronic obstructive pulmonary disease) (HCC) Not acutely exacerbated Patient has used as needed O2 in the past Continue home inhalers.  DuoNebs as needed  Dementia without behavioral disturbance ([place]) Delirium precautions  OSA (obstructive sleep apnea) CPAP if desired   Old records reviewed in assessment of this patient  Antimicrobials:   DVT prophylaxis:   apixaban (ELIQUIS) tablet 5 mg   Code Status:   Code Status: Full Code  Mobility Assessment (last 72 hours)     Mobility Assessment     Row Name 06/27/22 07:33:17 06/26/22 2217 06/26/22 0900 06/26/22 0833 06/25/22 2100   Does patient have an order for bedrest or is patient medically unstable No - Continue assessment Yes- Bedfast (Level 1) - Complete -- -- No - Continue assessment   What is the highest level of mobility based on the progressive mobility assessment? Level 4 (Walks with assist in room) - Balance while marching in place and cannot step forward and back - Complete Level 4 (Walks with assist in room) - Balance while marching in place and cannot step forward and back - Complete Level 3 (Stands with assist) - Balance while standing  and cannot march in place Level 3 (Stands with assist) - Balance while standing  and cannot march in place Level 5 (Walks with assist in room/hall) -  Balance while stepping forward/back and can walk in room with assist - Complete    Row Name 06/25/22 2037           Does patient have an order for bedrest or is patient medically unstable No - Continue assessment       What is the highest level of mobility based on the progressive mobility assessment? Level 5 (Walks with assist in room/hall) - Balance while stepping forward/back and can walk in room with assist -  Complete                Barriers to discharge:  Disposition Plan: SNF Status is: Inpatient  Objective: Blood pressure 134/69, pulse (!) 44, temperature 97.6 F (36.4 C), resp. rate 16, height _0  (1.88 m), weight 113.4 kg, SpO2 100 %.  Examination:  Physical Exam Constitutional:      General: He is not in acute distress.    Appearance: Normal appearance.  HENT:     Head: Normocephalic and atraumatic.     Mouth/Throat:     Mouth: Mucous membranes are moist.  Eyes:     Extraocular Movements: Extraocular movements intact.  Cardiovascular:     Rate and Rhythm: Normal rate and regular rhythm.     Heart sounds: Normal heart sounds.  Pulmonary:     Effort: Pulmonary effort is normal. No respiratory distress.     Breath sounds: Normal breath sounds. No wheezing.  Abdominal:     General: Bowel sounds are normal. There is no distension.     Palpations: Abdomen is soft.     Tenderness: There is no abdominal tenderness.  Musculoskeletal:     Cervical back: Normal range of motion and neck supple.     Comments: Pain with passive ROM with left knee.  Left knee does show some edema compared to the right along with mild calor.  No significant erythema or signs to suggest infection  Skin:    General: Skin is warm and dry.  Neurological:     General: No focal deficit present.     Mental Status: He is alert.  Psychiatric:        Mood and Affect: Mood normal.        Behavior: Behavior normal.      Consultants:    Procedures:    Data Reviewed: No results found for this or any previous visit (from the past 24 hour(s)).   I have Reviewed nursing notes, Vitals, and Lab results since pt's last encounter. Pertinent lab results : see above I have reviewed the last note from staff over past 24 hours I have discussed pt's care plan and test results with nursing staff, case manager     LOS: 2 days   Dwyane Dee, MD [facility] 06/27/2022, 12:19 PM

## 2022-06-27 NOTE — Plan of Care (Signed)

## 2022-06-27 NOTE — TOC Progression Note (Addendum)
Transition of Care [facility]) - Progression Note    Patient Details  Name: Matthew [name] MRN: [id number] Date of Birth: 1950/09/30  [facility] CM/SW Contact  Laurena Slimmer, RN Phone Number: 06/27/2022, 4:30 PM  Clinical Narrative:    Spoke with  patient's daughter [name]. She stated she had called a APS referral on patient yesterday. She reported speaking with patient last night. He was agreeable to SNF but today seemed more confused and saying he was going home. [name] advised if patient is not able to make decisions we have to defer to next of kin which would be his wife. Patient's wife is incapacitated at [facility]. Patient daughter has agreed to make decisions for him.   MD notified of issues with capacity.         Expected Discharge Plan and Services                                                 Social Determinants of Health (SDOH) Interventions    Readmission Risk Interventions    05/04/2022   10:42 AM 10/18/2021   12:39 PM  Readmission Risk Prevention Plan  Transportation Screening  Complete  HRI or Home Care Consult Complete   Palliative Care Screening Complete   Medication Review ([place]) Complete Complete  PCP or Specialist appointment within 3-5 days of discharge  Complete  HRI or [place]  Complete  SW Recovery Care/Counseling Consult  Complete  [place]  Not Applicable

## 2022-06-28 DIAGNOSIS — F0390 Unspecified dementia without behavioral disturbance: Secondary | ICD-10-CM

## 2022-06-28 DIAGNOSIS — Z602 Problems related to living alone: Secondary | ICD-10-CM

## 2022-06-28 DIAGNOSIS — M1712 Unilateral primary osteoarthritis, left knee: Secondary | ICD-10-CM | POA: Diagnosis not present

## 2022-06-28 DIAGNOSIS — J432 Centrilobular emphysema: Secondary | ICD-10-CM

## 2022-06-28 DIAGNOSIS — I4819 Other persistent atrial fibrillation: Secondary | ICD-10-CM | POA: Diagnosis not present

## 2022-06-28 NOTE — Progress Notes (Signed)
[facility]                                                                               Matthew [name], is a 71 y.o. male, DOB - 03-Aug-1951, DBZ:[id number] Admit date - 06/25/2022    Outpatient Primary MD for the patient is [name], MD  LOS - 3  days    Brief summary   Matthew [name] is a 71 year old male with PMH osteoarthritis, COPD, OSA not on CPAP, obesity, CKD 4, HTN, PAF, dementia, systolic CHF.  Patient presented with worsening pain and swelling in his left knee with difficulty ambulating.  He has also had poor appetite at home and not eating well as his wife has also been currently hospitalized.  He does have some help at home who has been checking on him while he has been alone.  Due to knee pain, he was brought to the hospital for further evaluation. Left knee x-rays were obtained in the ER which were negative for acute fracture.  He does have an underlying chronic fracture of the distal left femur that showed interval healing and no change to the underlying hardware.  Tricompartmental osteoarthritis was also appreciated with a small joint effusion. He was admitted for pain control and PT evaluation.   Assessment & Plan    Assessment and Plan: * Osteoarthritis of left knee - History of distal left femur fracture, October 2021 - Underlying lateral fixation plate and screws appreciated on x-ray with no loosening -Continue pain control - Trial of Toradol for swollen left knee - PT recommending SNF, waiting for placement.   Physical deconditioning - follow up PT/OT evals: SNF recommended  Persistent atrial fibrillation (HCC) - Continue Eliquis , metoprolol discontinued due to bradycardia in 40's.  Anemia of chronic kidney failure, stage 4 (severe) (HCC) - Baseline hemoglobin around 9 to 10 g/dL - Currently at baseline, possibly some mild hemoconcentration  CKD (chronic kidney disease) stage 4, GFR 15-29 ml/min (HCC) - patient has history of CKD4. Baseline  creat ~ 2.5, eGFR 27 - currently at baseline    Chronic systolic CHF (congestive heart failure) (HCC) Clinically euvolemic EF 35 to 40% in March 2023 Continue  with furosemide  COPD (chronic obstructive pulmonary disease) ([place]) Not acutely exacerbated Patient has used as needed O2 in the past Continue home inhalers.  DuoNebs as needed  Dementia without behavioral disturbance ([employer]) Delirium precautions  OSA (obstructive sleep apnea) CPAP if desired   Estimated body mass index is 32.1 kg/m as calculated from the following:   Height as of this encounter: _0  (1.88 m).   Weight as of this encounter: 113.4 kg.  Code Status: full code.  DVT Prophylaxis:   apixaban (ELIQUIS) tablet 5 mg   Level of Care: Level of care: Med-Surg Family Communication: none at bedside.   Disposition Plan:     Remains inpatient appropriate:  SNF placement.   Procedures:  None.   Consultants:   None.   Antimicrobials:   Anti-infectives (From admission, onward)    None        Medications  Scheduled Meds:  apixaban  5 mg Oral BID   DULoxetine  60 mg  Oral Daily   furosemide  20 mg Oral Daily   ketorolac  15 mg Intravenous Q6H   metoprolol succinate  12.5 mg Oral Daily   mometasone-formoterol  2 puff Inhalation BID   Continuous Infusions: PRN Meds:.acetaminophen **OR** acetaminophen, ALPRAZolam, HYDROcodone-acetaminophen, ipratropium-albuterol, ondansetron **OR** ondansetron (ZOFRAN) IV, polyethylene glycol    Subjective:   Matthew [name] was seen and examined today.  No new complaints. Is agreeable to SNF.  No chest pain or sob.   Objective:   Vitals:   06/27/22 0733 06/27/22 1550 06/28/22 0021 06/28/22 0742  BP: 134/69 (!) 111/59 133/67 122/61  Pulse: (!) 44 (!) 46 (!) 51 (!) 48  Resp: _0 Temp: 97.6 F (36.4 C) 97.8 F (36.6 C) 98 F (36.7 C) 98.3 F (36.8 C)  TempSrc:      SpO2: 100% 97% 97% 99%  Weight:      Height:        Intake/Output Summary (Last  24 hours) at 06/28/2022 1454 Last data filed at 06/28/2022 0941 Gross per 24 hour  Intake --  Output 650 ml  Net -650 ml   Filed Weights   06/25/22 1451  Weight: 113.4 kg     Exam General exam: Appears calm and comfortable  Respiratory system: Clear to auscultation. Respiratory effort normal. Cardiovascular system: S1 & S2 heard, RRR. No JVD,  Gastrointestinal system: Abdomen is nondistended, soft and nontender.  Central nervous system: Alert and oriented. No focal neurological deficits. Extremities: Symmetric 5 x 5 power. Skin: No rashes, lesions or ulcers Psychiatry: mood is appropriate.    Data Reviewed:  I have personally reviewed following labs and imaging studies   CBC Lab Results  Component Value Date   WBC 6.4 06/25/2022   RBC 3.82 (L) 06/25/2022   HGB 11.1 (L) 06/25/2022   HCT 37.6 (L) 06/25/2022   MCV 98.4 06/25/2022   MCH 29.1 06/25/2022   PLT 256 06/25/2022   MCHC 29.5 (L) 06/25/2022   RDW 14.4 06/25/2022   LYMPHSABS 1.8 06/25/2022   MONOABS 0.8 06/25/2022   EOSABS 0.1 06/25/2022   BASOSABS 0.0 59/45/8592     Last metabolic panel Lab Results  Component Value Date   NA 139 06/25/2022   K 4.6 06/25/2022   CL 108 06/25/2022   CO2 27 06/25/2022   BUN 27 (H) 06/25/2022   CREATININE 2.22 (H) 06/25/2022   GLUCOSE 117 (H) 06/25/2022   GFRNONAA 31 (L) 06/25/2022   GFRAA 31 (L) 05/18/2020   CALCIUM 9.4 06/25/2022   PHOS 3.4 05/11/2022   PROT 6.6 06/25/2022   ALBUMIN 2.7 (L) 06/25/2022   BILITOT 0.5 06/25/2022   ALKPHOS 89 06/25/2022   AST 12 (L) 06/25/2022   ALT 8 06/25/2022   ANIONGAP 4 (L) 06/25/2022    CBG (last 3)  No results for input(s): "GLUCAP" in the last 72 hours.    Coagulation Profile: No results for input(s): "INR", "PROTIME" in the last 168 hours.   Radiology Studies: No results found.     Hosie Poisson M.D. [facility] 06/28/2022, 2:54 PM  Available via Epic secure chat 7am-7pm After 7 pm, please refer to  night coverage provider listed on amion.

## 2022-06-28 NOTE — Plan of Care (Signed)

## 2022-06-28 NOTE — Care Management Important Message (Signed)
Important Message  Patient Details  Name: Matthew [name] MRN: [id number] Date of Birth: 01-25-1951   Medicare Important Message Given:  N/A - LOS <3 / Initial given by admissions     [name] [name] 06/28/2022, 8:52 AM

## 2022-06-28 NOTE — Progress Notes (Signed)
Physical Therapy Treatment Patient Details Name: Matthew [name] MRN: [id number] DOB: 1950-08-20 Today's Date: 06/28/2022   History of Present Illness Patient is a 71 year old male admitted from home with knee pain, unable to care for himself. History of  chronic left knee pain secondary to arthritis and old fracture of the distal femur in 05/2020 with hardware, COPD, class IV obesity, CKD stage IV, HTN, A-fib on Eliquis, dementia, systolic CHF.    PT Comments    Pt was long sitting in bed upon arriving. He is alert and conversational but Pryor Curia questions if he has good insight of current situation. Pt lives at home with spouse(his caregiver). She is currently admitted to hospital and unable to provide the care he currently requires. He was able to exit bed, stand, and pivot to recliner but has poor safety throughout. Recommend DC to SNF to maximize his independence with ADLs prior to returning home. If pt is able to find 24/7 caregiver for home, may be appropriate to DC home with HHPT to follow.    Recommendations for follow up therapy are one component of a multi-disciplinary discharge planning process, led by the attending physician.  Recommendations may be updated based on patient status, additional functional criteria and insurance authorization.  Follow Up Recommendations  Skilled nursing-short term rehab (<3 hours/day) (Will need 24/7 assistance at DC. Wants to go home but spouse is currently admitted and usually his caregiver.)     Assistance Recommended at Discharge Frequent or constant Supervision/Assistance  Patient can return home with the following A little help with walking and/or transfers;A little help with bathing/dressing/bathroom;Help with stairs or ramp for entrance;Assist for transportation;Assistance with cooking/housework   Equipment Recommendations  None recommended by PT       Precautions / Restrictions Precautions Precautions: Fall Restrictions Weight Bearing  Restrictions: No     Mobility  Bed Mobility Overal bed mobility: Needs Assistance Bed Mobility: Supine to Sit   Supine to sit: Supervision  General bed mobility comments: no physical assistance required    Transfers Overall transfer level: Needs assistance Equipment used: Rolling walker (2 wheels) Transfers: Sit to/from Stand Sit to Stand: Min assist, From elevated surface    General transfer comment: pt stood 2 x from EOB(elevated) however reportys he does not ambulate at baseline. does lateral transfers into/out of w/c    Ambulation/Gait    General Gait Details: per pt, " I dont really walk at home." he was able to pivot to recliner from EOB but with poor safety awareness. sits unexpectedly (quickly)   Balance Overall balance assessment: Needs assistance Sitting-balance support: Feet supported Sitting balance-Leahy Scale: Fair Sitting balance - Comments: no external support required   Standing balance support: Bilateral upper extremity supported, Reliant on assistive device for balance Standing balance-Leahy Scale: Fair Standing balance comment: heavy reliance on rolling walker for support in standing    Cognition Arousal/Alertness: Awake/alert Behavior During Therapy: WFL for tasks assessed/performed Overall Cognitive Status: Within Functional Limits for tasks assessed    General Comments: no family present to determine baseline cognition           General Comments General comments (skin integrity, edema, etc.): reviewed ther ex to promote return in function and strength      Pertinent Vitals/Pain Pain Assessment Pain Assessment: No/denies pain Pain Score: 0-No pain     PT Goals (current goals can now be found in the care plan section) Acute Rehab PT Goals Patient Stated Goal: go home with my wife if  she is able to return home after admission (pt's spouse is currently admitted and is his caregiver at home. Progress towards PT goals: Progressing toward  goals    Frequency    Min 2X/week      PT Plan Current plan remains appropriate       AM-PAC PT "6 Clicks" Mobility   Outcome Measure  Help needed turning from your back to your side while in a flat bed without using bedrails?: A Little Help needed moving from lying on your back to sitting on the side of a flat bed without using bedrails?: A Little Help needed moving to and from a bed to a chair (including a wheelchair)?: A Lot Help needed standing up from a chair using your arms (e.g., wheelchair or bedside chair)?: A Lot Help needed to walk in hospital room?: A Lot Help needed climbing 3-5 steps with a railing? : A Lot 6 Click Score: 14    End of Session   Activity Tolerance: Patient tolerated treatment well Patient left: in chair;with call bell/phone within reach;with chair alarm set Nurse Communication: Mobility status PT Visit Diagnosis: Unsteadiness on feet (R26.81);Muscle weakness (generalized) (M62.81);Pain Pain - Right/Left: Left Pain - part of body: Knee     Time: 4970-2637 PT Time Calculation (min) (ACUTE ONLY): 12 min  Charges:  $Therapeutic Activity: 8-22 mins                    Julaine Fusi PTA 06/28/22, 12:53 PM

## 2022-06-28 NOTE — Plan of Care (Signed)
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
## 2022-06-29 DIAGNOSIS — I4819 Other persistent atrial fibrillation: Secondary | ICD-10-CM | POA: Diagnosis not present

## 2022-06-29 DIAGNOSIS — Z602 Problems related to living alone: Secondary | ICD-10-CM | POA: Diagnosis not present

## 2022-06-29 DIAGNOSIS — F0390 Unspecified dementia without behavioral disturbance: Secondary | ICD-10-CM | POA: Diagnosis not present

## 2022-06-29 DIAGNOSIS — M25562 Pain in left knee: Secondary | ICD-10-CM | POA: Diagnosis not present

## 2022-06-29 DIAGNOSIS — J432 Centrilobular emphysema: Secondary | ICD-10-CM | POA: Diagnosis not present

## 2022-06-29 DIAGNOSIS — M8440XA Pathological fracture, unspecified site, initial encounter for fracture: Secondary | ICD-10-CM | POA: Diagnosis not present

## 2022-06-29 DIAGNOSIS — M1712 Unilateral primary osteoarthritis, left knee: Secondary | ICD-10-CM | POA: Diagnosis not present

## 2022-06-29 NOTE — Plan of Care (Signed)

## 2022-06-29 NOTE — Plan of Care (Signed)
  Problem: Education: Goal: Knowledge of General Education information will improve Description: Including pain rating scale, medication(s)/side effects and non-pharmacologic comfort measures 06/29/2022 0354 by Rogelia Rohrer, LPN Outcome: Progressing 06/29/2022 0346 by Rogelia Rohrer, LPN Outcome: Progressing   Problem: Health Behavior/Discharge Planning: Goal: Ability to manage health-related needs will improve 06/29/2022 0354 by Rogelia Rohrer, LPN Outcome: Progressing 06/29/2022 0346 by Rogelia Rohrer, LPN Outcome: Progressing   Problem: Clinical Measurements: Goal: Ability to maintain clinical measurements within normal limits will improve 06/29/2022 0354 by Rogelia Rohrer, LPN Outcome: Progressing 06/29/2022 0346 by Rogelia Rohrer, LPN Outcome: Progressing Goal: Will remain free from infection 06/29/2022 0354 by Rogelia Rohrer, LPN Outcome: Progressing 06/29/2022 0346 by Rogelia Rohrer, LPN Outcome: Progressing Goal: Diagnostic test results will improve 06/29/2022 0354 by Rogelia Rohrer, LPN Outcome: Progressing 06/29/2022 0346 by Rogelia Rohrer, LPN Outcome: Progressing Goal: Respiratory complications will improve 06/29/2022 0354 by Rogelia Rohrer, LPN Outcome: Progressing 06/29/2022 0346 by Rogelia Rohrer, LPN Outcome: Progressing Goal: Cardiovascular complication will be avoided 06/29/2022 0354 by Rogelia Rohrer, LPN Outcome: Progressing 06/29/2022 0346 by Rogelia Rohrer, LPN Outcome: Progressing   Problem: Activity: Goal: Risk for activity intolerance will decrease 06/29/2022 0354 by Rogelia Rohrer, LPN Outcome: Progressing 06/29/2022 0346 by Rogelia Rohrer, LPN Outcome: Progressing   Problem: Nutrition: Goal: Adequate nutrition will be maintained 06/29/2022 0354 by Rogelia Rohrer, LPN Outcome: Progressing 06/29/2022 0346 by Rogelia Rohrer, LPN Outcome: Progressing   Problem: Coping: Goal: Level  of anxiety will decrease 06/29/2022 0354 by Rogelia Rohrer, LPN Outcome: Progressing 06/29/2022 0346 by Rogelia Rohrer, LPN Outcome: Progressing   Problem: Elimination: Goal: Will not experience complications related to bowel motility 06/29/2022 0354 by Rogelia Rohrer, LPN Outcome: Progressing 06/29/2022 0346 by Rogelia Rohrer, LPN Outcome: Progressing Goal: Will not experience complications related to urinary retention 06/29/2022 0354 by Rogelia Rohrer, LPN Outcome: Progressing 06/29/2022 0346 by Rogelia Rohrer, LPN Outcome: Progressing   Problem: Pain Managment: Goal: General experience of comfort will improve 06/29/2022 0354 by Rogelia Rohrer, LPN Outcome: Progressing 06/29/2022 0346 by Rogelia Rohrer, LPN Outcome: Progressing   Problem: Safety: Goal: Ability to remain free from injury will improve 06/29/2022 0354 by Rogelia Rohrer, LPN Outcome: Progressing 06/29/2022 0346 by Rogelia Rohrer, LPN Outcome: Progressing   Problem: Skin Integrity: Goal: Risk for impaired skin integrity will decrease 06/29/2022 0354 by Rogelia Rohrer, LPN Outcome: Progressing 06/29/2022 0346 by Rogelia Rohrer, LPN Outcome: Progressing

## 2022-06-29 NOTE — TOC Progression Note (Signed)
Transition of Care [facility]) - Progression Note    Patient Details  Name: Matthew [name] MRN: [id number] Date of Birth: 04/15/1951  [facility]) CM/SW Contact  Laurena Slimmer, RN Phone Number: 06/29/2022, 4:01 PM  Clinical Narrative:    Spoke with patient's daughter Hanley Seamen @ [phone] to give bed offers for [facility], [facility] and [facility].  She would prefer [facility] [name] was advised insurance Josem Kaufmann was required.  [name] will be out of the country for the [place] and will be available at the same phone number however will be on a eight hours time difference.   [place] started.         Expected Discharge Plan and Services                                                 Social Determinants of Health (SDOH) Interventions    Readmission Risk Interventions    05/04/2022   10:42 AM 10/18/2021   12:39 PM  Readmission Risk Prevention Plan  Transportation Screening  Complete  HRI or Home Care Consult Complete   Palliative Care Screening Complete   Medication Review ([place]) Complete Complete  PCP or Specialist appointment within 3-5 days of discharge  Complete  HRI or [place]  Complete  SW Recovery Care/Counseling Consult  Complete  [place]  Not Applicable

## 2022-06-29 NOTE — Plan of Care (Signed)

## 2022-06-29 NOTE — Progress Notes (Signed)
[facility]                                                                               Darrol Brandenburg, is a 71 y.o. male, DOB - January 23, 1951, [id number] Admit date - 06/25/2022    Outpatient Primary MD for the patient is [name], MD  LOS - 4  days    Brief summary   [name] is a 71 year old male with PMH osteoarthritis, COPD, OSA not on CPAP, obesity, CKD 4, HTN, PAF, dementia, systolic CHF.  Patient presented with worsening pain and swelling in his left knee with difficulty ambulating.  He has also had poor appetite at home and not eating well as his wife has also been currently hospitalized.  He does have some help at home who has been checking on him while he has been alone.  Due to knee pain, he was brought to the hospital for further evaluation. Left knee x-rays were obtained in the ER which were negative for acute fracture.  He does have an underlying chronic fracture of the distal left femur that showed interval healing and no change to the underlying hardware.  Tricompartmental osteoarthritis was also appreciated with a small joint effusion. He was admitted for pain control and PT evaluation.  Therapy eval recommending SNF.  Assessment & Plan    Assessment and Plan: * Osteoarthritis of left knee - History of distal left femur fracture, October 2021 - Underlying lateral fixation plate and screws appreciated on x-ray with no loosening -Continue pain control - Trial of Toradol for swollen left knee - PT recommending SNF, waiting for placement.  No new complaints.   Physical deconditioning - follow up PT/OT evals: SNF recommended  Persistent atrial fibrillation (HCC) - Continue Eliquis , metoprolol discontinued due to bradycardia in 40's. - HR improved to 50/min.   Anemia of chronic kidney failure, stage 4 (severe) (HCC) - Baseline hemoglobin around 9 to 10 g/dL - Currently at baseline, possibly some mild hemoconcentration  CKD (chronic kidney  disease) stage 4, GFR 15-29 ml/min (HCC) - patient has history of CKD4. Baseline creat ~ 2.5, eGFR 27 - currently at baseline    Chronic systolic CHF (congestive heart failure) (HCC) Clinically euvolemic EF 35 to 40% in March 2023 Continue  with furosemide  COPD (chronic obstructive pulmonary disease) ([place]) Not acutely exacerbated Patient has used as needed O2 in the past Continue home inhalers.  DuoNebs as needed  Dementia without behavioral disturbance ([place]) Delirium precautions  OSA (obstructive sleep apnea) CPAP if desired  Psychiatry consulted for capacity evaluation, for disposition.  Pt has dementia with memory issues.    Estimated body mass index is 32.1 kg/m as calculated from the following:   Height as of this encounter: _0  (1.88 m).   Weight as of this encounter: 113.4 kg.  Code Status: full code.  DVT Prophylaxis:   apixaban (ELIQUIS) tablet 5 mg   Level of Care: Level of care: Med-Surg Family Communication: none at bedside.   Disposition Plan:     Remains inpatient appropriate:  SNF placement.   Procedures:  None.   Consultants:   None.   Antimicrobials:   Anti-infectives (From  admission, onward)    None        Medications  Scheduled Meds:  apixaban  5 mg Oral BID   DULoxetine  60 mg Oral Daily   furosemide  20 mg Oral Daily   ketorolac  15 mg Intravenous Q6H   mometasone-formoterol  2 puff Inhalation BID   Continuous Infusions: PRN Meds:.acetaminophen **OR** acetaminophen, ALPRAZolam, HYDROcodone-acetaminophen, ipratropium-albuterol, ondansetron **OR** ondansetron (ZOFRAN) IV, polyethylene glycol    Subjective:   Matthew [name] was seen and examined today.  No new complaints.   Objective:   Vitals:   06/28/22 1525 06/28/22 2106 06/29/22 0401 06/29/22 0810  BP: (!) 118/59 127/68 125/68 123/69  Pulse: (!) 57 (!) 50 (!) 49 (!) 55  Resp: _0 Temp: 97.8 F (36.6 C) 98.3 F (36.8 C) 97.7 F (36.5 C) 98.2 F (36.8 C)   TempSrc:  Oral    SpO2: 97% 100% 100% 97%  Weight:      Height:        Intake/Output Summary (Last 24 hours) at 06/29/2022 1240 Last data filed at 06/29/2022 0400 Gross per 24 hour  Intake --  Output 1600 ml  Net -1600 ml    Filed Weights   06/25/22 1451  Weight: 113.4 kg     Exam General exam: Appears calm and comfortable  Respiratory system: Clear to auscultation. Respiratory effort normal. Cardiovascular system: S1 & S2 heard, RRR. No JVD,  Gastrointestinal system: Abdomen is nondistended, soft and nontender.  Central nervous system: Alert and oriented to person and place.  Extremities: Symmetric 5 x 5 power. Skin: No rashes, lesions or ulcers Psychiatry:  Mood & affect appropriate.    Data Reviewed:  I have personally reviewed following labs and imaging studies   CBC Lab Results  Component Value Date   WBC 6.4 06/25/2022   RBC 3.82 (L) 06/25/2022   HGB 11.1 (L) 06/25/2022   HCT 37.6 (L) 06/25/2022   MCV 98.4 06/25/2022   MCH 29.1 06/25/2022   PLT 256 06/25/2022   MCHC 29.5 (L) 06/25/2022   RDW 14.4 06/25/2022   LYMPHSABS 1.8 06/25/2022   MONOABS 0.8 06/25/2022   EOSABS 0.1 06/25/2022   BASOSABS 0.0 82/95/6213     Last metabolic panel Lab Results  Component Value Date   NA 139 06/25/2022   K 4.6 06/25/2022   CL 108 06/25/2022   CO2 27 06/25/2022   BUN 27 (H) 06/25/2022   CREATININE 2.22 (H) 06/25/2022   GLUCOSE 117 (H) 06/25/2022   GFRNONAA 31 (L) 06/25/2022   GFRAA 31 (L) 05/18/2020   CALCIUM 9.4 06/25/2022   PHOS 3.4 05/11/2022   PROT 6.6 06/25/2022   ALBUMIN 2.7 (L) 06/25/2022   BILITOT 0.5 06/25/2022   ALKPHOS 89 06/25/2022   AST 12 (L) 06/25/2022   ALT 8 06/25/2022   ANIONGAP 4 (L) 06/25/2022    CBG (last 3)  No results for input(s): "GLUCAP" in the last 72 hours.    Coagulation Profile: No results for input(s): "INR", "PROTIME" in the last 168 hours.   Radiology Studies: No results found.     Hosie Poisson M.D. [facility] 06/29/2022, 12:40 PM  Available via Epic secure chat 7am-7pm After 7 pm, please refer to night coverage provider listed on amion.

## 2022-06-29 NOTE — Consult Note (Signed)
Consultation Note Date: 06/29/2022 at 1000  Patient Name: Matthew [name]  DOB: [birth year]  MRN: [id number]  Age / Sex: 71 y.o., male  PCP: [name], MD Referring Physician: Hosie Poisson, MD  Reason for Consultation: Establishing goals of care  HPI/Patient Profile: 70 y.o. male  with past medical history of osteoarthritis, OSA, obesity, CKD (stage IV), HTN, paroxysmal A-fib ([place]), dementia, and systolic CHF admitted on 75/44/9201 with left knee pain.   Radiographs and ER were negative for acute fracture.  Patient has underlying chronic fracture of left distal femur with interval healing and no change to the underlying hardware.  Also noted was tricompartmental osteoarthritis with a small joint effusion.  Patient is being treated with Norco and Toradol for pain.  PT evaluation has recommended SNF.  Psychiatry evaluated patient on 11/15 and patient does not have capacity.  Patient's next of kin and decision maker is his daughter Matthew [name].  PMT was consulted to discuss goals of care.  Clinical Assessment and Goals of Care: Prior to meeting with the patient, I secure chatted with Dr. Karleen Hampshire, who shares that patient is medically stable for discharge.  TOC is following closely to arrange SNF placement. TOC also confirmed that patient's wife is in ICU and that patient's decision maker is his daughter Matthew [name].   I reviewed medical records including EPIC notes, labs and imaging, assessed the patient and then met with patient at bedside  to discuss diagnosis prognosis, GOC, EOL wishes, disposition and options.  I introduced Palliative Medicine as specialized medical care for people living with serious illness. It focuses on providing relief from the symptoms and stress of a serious illness. The goal is to improve quality of life for both the patient and the family.  We discussed a brief life review of the  patient.  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  He shares that drumming is like therapy and that he really misses performing. He has been married to his wife over 63 years. He shares he is worried about her because he knows she is sick and in the hospital. He says he was able to visit with her a few days ago and is hoping to get to see her again. He said he tried to call the hospital operator to connect him to her room but that they told him they were going to call him back.   As far as functional and nutritional status patient says he was doing "ok" at home.  He says he lives with his wife who "runs the house". He says he can get OOB to the chair if he is holding onto something. When asked about how he toilets, he shares, "I manage".   I completed a pain assessment. Pt endorses he lives with 8 out of 10 pain daily.  When asked how he has manage his pain in the past, he jokingly says he "cusses a lot".  Then,  in seriousness, he shares that Tylenol #3 with codeine has helped "keep the pain at [place]". He endorses pain at rest and with movement. He shares that his knee is normally swollen but that it is not swollen today - which "is a good thing".   However, he shares he tolerates and lives with a high degree of pain on a day-to-day basis. He also shares he has a high tolerance for pain medications but he is afraid to say that to most doctors because he does not want to be seen a  "pill popper".   We discussed patient's current illness and what it means in the larger context of patient's on-going co-morbidities. He was able to verbalize that he is in the hospital because he has left knee pain and it "must have given out". He says he has had this pain for quite some time now and is not sure what happened to make him come to the hospital about it. But, he is feeling better and just worried about his wife.   I attempted to  elicit values and goals of care important to the patient.  Patient shares that his wife runs his house in his life.  He says he will "do what ever she says".  He "leaves it all up to her".  Advance directives, concepts specific to code status, artificial feeding and hydration, and rehospitalization were considered and discussed.  When asked in the event of a cardiopulmonary arrest, patient was in agreement that he would accept CPR, defibrillation, and use of mechanical ventilation to sustain his life.  When asked how long he would want to remain on the ventilator, he shares "I would let my wife decide".  Patient says he would want "the doctors to give it a try".   Full code remains.  Discussed with patient the importance of continued conversation with family and the medical providers regarding overall plan of care and treatment options, ensuring decisions are within the context of the patient's values and GOCs.  He continued to say that he would "do what ever my wife wants me to do".  From my assessment, patient has decision making capacity for himself today. He has insight into his own medical situation and is able to receive information. He is awake, alert, and oriented to himself, place, and situation. During our discussion, patient was not tangential, but rather he was focused and able to appropriately participate in a back and forth, linear, rational discussion of his medical issues and goals of care.   After my discussion with the patient, I spoke with patient's daughter Matthew [name] over the phone and conveyed the above conversation. She is in agreement for patient to remain a full code and accepting of all offered, available, and appropriate medical intervention to sustain patient's life.   She is also in agreement with recommendation for patient to d/c to SNF. She shares will continue to speak with patient over the phone over the next few days as plans are solidified for d/c to SNF>   TOC following  closely.   Daughter's questions and concerns were addressed. PMT will remain available to patient and family throughout his hospitalization.  Goals are clear. Full code/full scope.   Primary Decision Maker NEXT OF KIN  Physical Exam Vitals reviewed.  Constitutional:      General: He is not in acute distress.    Appearance: He is obese. He is not ill-appearing.  HENT:     Mouth/Throat:     Mouth: Mucous  membranes are moist.     Comments: Poor dentition Eyes:     Pupils: Pupils are equal, round, and reactive to light.  Cardiovascular:     Rate and Rhythm: Normal rate.     Pulses: Normal pulses.  Pulmonary:     Effort: Pulmonary effort is normal.  Abdominal:     Palpations: Abdomen is soft.  Musculoskeletal:     Comments: Left LE weakness  Skin:    General: Skin is warm and dry.  Neurological:     Mental Status: He is alert.     Comments: Oriented to self, place, and situation  Psychiatric:        Mood and Affect: Mood normal.        Behavior: Behavior normal.        Judgment: Judgment normal.     Palliative Assessment/Data: 50-60%     Thank you for this consult. Palliative medicine will continue to follow and assist holistically.   Time Total: 75 minutes Greater than 50%  of this time was spent counseling and coordinating care related to the above assessment and plan.  Signed by: Jordan Hawks, DNP, FNP-BC Palliative Medicine    Please contact Palliative Medicine Team phone at [phone] for questions and concerns.  For individual provider: See Shea Evans

## 2022-06-29 NOTE — NC FL2 (Signed)
[place] LEVEL OF CARE SCREENING TOOL     IDENTIFICATION  Patient Name: Matthew [name] Birthdate: 05-Jun-1951 Sex: male Admission Date (Current Location): 06/25/2022  [facility] and [place] Number:  Engineering geologist and Address:  [facility], [address], [place],  [address]      Provider Number: [id number]  Attending Physician Name and Address:  Hosie Poisson, MD  Relative Name and Phone Number:  [name] [phone]    Current Level of Care: Hospital Recommended Level of Care: [place] Prior Approval Number:    Date Approved/Denied:   PASRR Number: [id number]  Discharge Plan: SNF    Current Diagnoses: Patient Active Problem List   Diagnosis Date Noted   Self-care deficit in patient living alone 06/28/2022   Ambulatory dysfunction 06/25/2022   Osteoarthritis of left knee 06/25/2022   Physical deconditioning 06/25/2022   Chronic anticoagulation 06/25/2022   Anemia of chronic kidney failure, stage 4 (severe) ([place]) 06/25/2022   Chronic fracture distal left femur 06/25/2022   Left knee pain 06/25/2022   Dementia without behavioral disturbance ([place]) 06/25/2022   Hyponatremia 05/12/2022   Left shoulder pain 05/10/2022   Malnutrition of moderate degree 05/05/2022   Acute pyelonephritis 70/08/7492   Chronic systolic CHF (congestive heart failure) ([place]) 05/03/2022   Persistent atrial fibrillation (HCC)    NICM (nonischemic cardiomyopathy) ([place])    Rapid atrial fibrillation ([place]) 10/17/2021   Obesity, Class III, BMI 40-49.9 (morbid obesity) ([place]) 10/17/2021   OSA (obstructive sleep apnea) 10/17/2021   Anemia due to vitamin B12 deficiency    Delirium    Hyperkalemia    Heart failure with preserved ejection fraction (HCC)    CKD (chronic kidney disease) stage 4, GFR 15-29 ml/min (HCC)    Vitamin B12 deficiency    Depression    Atrial fibrillation with rapid ventricular response ([place]) 07/11/2021   Anemia  of chronic disease 07/11/2021   Asthma exacerbation 07/03/2021   Chest pain 07/03/2021   Acute gout    Right leg pain    Weakness    SOB (shortness of breath) 06/12/2021   Bilateral leg edema 06/12/2021   Atrial fibrillation with RVR ([place]) 06/12/2021   AKI (acute kidney injury) ([place]) 06/12/2021   Elevated glucose level 06/12/2021   Obesity (BMI 30-39.9) 06/12/2021   Macrocytosis 06/12/2021   Acute on chronic respiratory failure with hypoxia ([place]) 06/12/2021   Chronic pain of left knee 12/16/2020   COPD (chronic obstructive pulmonary disease) ([place]) 12/04/2020   Acute right hip pain 12/04/2020   H/O bilateral hip replacements 12/04/2020   Stage 3b chronic kidney disease ([place]) 12/04/2020   Displaced comminuted fracture of shaft of left femur, initial encounter for closed fracture ([place]) 05/19/2020   Asthma 05/18/2020   Hypertension 05/18/2020   Femur fracture, left ([place]) 05/18/2020   Closed comminuted intra-articular fracture of distal femur ([place]) 05/17/2020   Moderate persistent asthma with exacerbation 10/15/2018    Orientation RESPIRATION BLADDER Height & Weight     Self, Time  Normal Continent Weight: 113.4 kg Height:  '6\' 2"'$  (188 cm)  BEHAVIORAL SYMPTOMS/MOOD NEUROLOGICAL BOWEL NUTRITION STATUS  Other (Comment) (n/a)  (n/a) Continent Diet (Heart)  AMBULATORY STATUS COMMUNICATION OF NEEDS Skin   Limited Assist Verbally Skin abrasions, Bruising (bilateral arm and legs)                       Personal Care Assistance Level of Assistance  Bathing, Dressing Bathing Assistance: Limited  assistance   Dressing Assistance: Limited assistance     Functional Limitations Info             SPECIAL CARE FACTORS FREQUENCY  PT (By licensed PT)                    Contractures Contractures Info: Not present    Additional Factors Info  Code Status, Allergies Code Status Info: FULL Allergies Info: Demerol (Meperidine Hcl), Lisinopril, Meperidine And Related, [place]win  (Pentazocine)           Current Medications (06/29/2022):  This is the current hospital active medication list Current Facility-Administered Medications  Medication Dose Route Frequency Provider Last Rate Last Admin   acetaminophen (TYLENOL) tablet 650 mg  650 mg Oral Q6H PRN Athena Masse, MD       Or   acetaminophen (TYLENOL) suppository 650 mg  650 mg Rectal Q6H PRN Athena Masse, MD       ALPRAZolam Duanne Moron) tablet 0.5 mg  0.5 mg Oral BID PRN Dwyane Dee, MD   0.5 mg at 06/28/22 2101   apixaban (ELIQUIS) tablet 5 mg  5 mg Oral BID Athena Masse, MD   5 mg at 06/28/22 2101   DULoxetine (CYMBALTA) DR capsule 60 mg  60 mg Oral Daily Judd Gaudier V, MD   60 mg at 06/28/22 0930   furosemide (LASIX) tablet 20 mg  20 mg Oral Daily Athena Masse, MD   20 mg at 06/28/22 0931   HYDROcodone-acetaminophen (NORCO/VICODIN) 5-325 MG per tablet 1-2 tablet  1-2 tablet Oral Q4H PRN Athena Masse, MD   2 tablet at 06/29/22 0815   ipratropium-albuterol (DUONEB) 0.5-2.5 (3) MG/3ML nebulizer solution 3 mL  3 mL Nebulization Q6H PRN Athena Masse, MD       ketorolac (TORADOL) 30 MG/ML injection 15 mg  15 mg Intravenous Q6H Dwyane Dee, MD   15 mg at 06/29/22 0540   mometasone-formoterol (DULERA) 200-5 MCG/ACT inhaler 2 puff  2 puff Inhalation BID Athena Masse, MD   2 puff at 06/28/22 2101   ondansetron (ZOFRAN) tablet 4 mg  4 mg Oral Q6H PRN Athena Masse, MD       Or   ondansetron [facility]) injection 4 mg  4 mg Intravenous Q6H PRN Athena Masse, MD   4 mg at 06/27/22 0128   polyethylene glycol (MIRALAX / GLYCOLAX) packet 17 g  17 g Oral Daily PRN Athena Masse, MD   17 g at 06/28/22 1740     Discharge Medications: Please see discharge summary for a list of discharge medications.  Relevant Imaging Results:  Relevant Lab Results:   Additional Information SS# [id number]  Laurena Slimmer, RN

## 2022-06-29 NOTE — TOC Progression Note (Signed)
Transition of Care [facility]) - Progression Note    Patient Details  Name: Matthew [name] MRN: [id number] Date of Birth: Oct 06, 1950  Transition of Care [facility]) CM/SW Contact  Laurena Slimmer, RN Phone Number: 06/29/2022, 9:45 AM  Clinical Narrative:    Capacity evaluation completed. Patient determined not to have capacity.  FL2 completed. Bed search initiated. Per previous conversation with his daughter, [name] she did not have a preference of facility.  Awaiting bed offer.         Expected Discharge Plan and Services                                                 Social Determinants of Health (SDOH) Interventions    Readmission Risk Interventions    05/04/2022   10:42 AM 10/18/2021   12:39 PM  Readmission Risk Prevention Plan  Transportation Screening  Complete  HRI or Home Care Consult Complete   Palliative Care Screening Complete   Medication Review ([place]) Complete Complete  PCP or Specialist appointment within 3-5 days of discharge  Complete  HRI or [place]  Complete  SW Recovery Care/Counseling Consult  Complete  [place]  Not Applicable

## 2022-06-30 DIAGNOSIS — I4819 Other persistent atrial fibrillation: Secondary | ICD-10-CM | POA: Diagnosis not present

## 2022-06-30 DIAGNOSIS — F0390 Unspecified dementia without behavioral disturbance: Secondary | ICD-10-CM | POA: Diagnosis not present

## 2022-06-30 DIAGNOSIS — M8440XA Pathological fracture, unspecified site, initial encounter for fracture: Secondary | ICD-10-CM | POA: Diagnosis not present

## 2022-06-30 DIAGNOSIS — J432 Centrilobular emphysema: Secondary | ICD-10-CM | POA: Diagnosis not present

## 2022-06-30 DIAGNOSIS — Z602 Problems related to living alone: Secondary | ICD-10-CM | POA: Diagnosis not present

## 2022-06-30 DIAGNOSIS — M1712 Unilateral primary osteoarthritis, left knee: Secondary | ICD-10-CM | POA: Diagnosis not present

## 2022-06-30 DIAGNOSIS — M25562 Pain in left knee: Secondary | ICD-10-CM | POA: Diagnosis not present

## 2022-06-30 NOTE — Care Management Important Message (Signed)
Important Message  Patient Details  Name: Matthew [name] MRN: [id number] Date of Birth: 07/26/1951   Medicare Important Message Given:  Other (see comment)  Left a message for daughter, Matthew [name] [phone] to call me at her convenience to review the Important message from medicare. Will await a return call.   [name] A Jereline Ticer 06/30/2022, 12:19 PM

## 2022-06-30 NOTE — Care Management Important Message (Signed)
Important Message  Patient Details  Name: Matthew [name] MRN: [id number] Date of Birth: [birth year]   Medicare Important Message Given:  Yes  I received a return call from the patient's daughter and she is in agreement with the discharge. I thanked her for returning my call.    [name] A Foday Cone 06/30/2022, 1:10 PM

## 2022-06-30 NOTE — Progress Notes (Signed)
Palliative Care Progress Note, Assessment & Plan   Patient Name: Matthew [name]       Date: 06/30/2022 DOB: 24-Jun-1951  Age: 71 y.o. MRN#: [id number] Attending Physician: Hosie Poisson, MD Primary Care Physician: [name], MD Admit Date: 06/25/2022  Reason for Consultation/Follow-up: Establishing goals of care  Subjective: Patient is sitting in chair in no apparent distress.  He acknowledges my presence and is to make his wishes known.  He has no new complaints.  He continues to complain of pain in his left knee.  No family at bedside.  Summary of counseling/coordination of care: After reviewing the patient's chart and assessing the patient at bedside, I spoke with patient regards to plan of care.  We discussed that patient has been medically stabilized and the plan is for him to discharge to a skilled nursing facility.  Patient shares he is in agreement with this.  He recalls having been at a rehab facility previously this year.  He said he did well there and then returned home.  Patient in agreement to discharge to SNF.  TOC following closely.  I then discussed patient's goals of care and advanced care planning.  In addition to [place], which we discussed earlier this week, I discussed use of antibiotics, IV fluids, and artificial nutrition.  Patient shares that he is accepting of all of these things.  I discussed the details of his MOST form on file.  Patient confirmed that MOST form on file reflects his current wishes, which are as follows:  Cardiopulmonary Resuscitation: Attempt Resuscitation (CPR)  Medical Interventions: Full Scope of Treatment: Use intubation, advanced airway interventions, mechanical ventilation, cardioversion as indicated, medical treatment, IV fluids, etc, also provide  comfort measures. Transfer to the hospital if indicated  Antibiotics: Antibiotics if indicated  IV Fluids: IV fluids if indicated  Feeding Tube: Feeding tube for a defined trial period    Patient stated that he would be excepting of a feeding tube in the left for a short.  Time.  He said he would never want to live long-term with a feeding tube in place.  However, he says that he will do what ever his wife said.  Patient continued to ask if he can come and visit his wife.  Therapeutic silence and active listening provided for patient to share his thoughts and emotions regarding current medical situation.  Emotional support provided.  Goals are clear.  MOST form in place.  Daughter is surrogate decision maker when patient is unable to make decisions for himself.  Patient agreeable to SNF.  PMT will monitor the patient peripherally, shadow his chart, and remain available to patient and family throughout his hospitalization.  Please reengage with PMT if goals change, at patient/family's request, or if patient's health deteriorates during hospitalization.  Physical Exam Vitals reviewed.  Constitutional:      General: He is not in acute distress.    Appearance: He is obese. He is not ill-appearing.  HENT:     Head: Normocephalic.     Mouth/Throat:     Mouth: Mucous membranes are moist.  Eyes:     Pupils: Pupils are equal, round, and reactive to light.  Cardiovascular:  Rate and Rhythm: Normal rate.     Pulses: Normal pulses.  Pulmonary:     Effort: Pulmonary effort is normal.  Abdominal:     Palpations: Abdomen is soft.  Musculoskeletal:     Comments: MAETC, LLE weakness  Skin:    General: Skin is warm and dry.  Neurological:     Mental Status: He is alert and oriented to person, place, and time.  Psychiatric:        Mood and Affect: Mood normal.        Behavior: Behavior normal.        Thought Content: Thought content normal.        Judgment: Judgment normal.              Palliative Assessment/Data: 50-60%    Total Time 35 minutes  Greater than 50%  of this time was spent counseling and coordinating care related to the above assessment and plan.  Thank you for allowing the Palliative Medicine Team to assist in the care of this patient.  [place] Ilsa Iha, FNP-BC Palliative Medicine Team Team Phone # [phone]

## 2022-06-30 NOTE — TOC Progression Note (Signed)
Transition of Care [facility]) - Progression Note    Patient Details  Name: Matthew [name] MRN: [id number] Date of Birth: May 29, 1951  [facility]) CM/SW Contact  Laurena Slimmer, RN Phone Number: 06/30/2022, 10:18 AM  Clinical Narrative:    Per [facility] portal auth still pending review.         Expected Discharge Plan and Services                                                 Social Determinants of Health (SDOH) Interventions    Readmission Risk Interventions    05/04/2022   10:42 AM 10/18/2021   12:39 PM  Readmission Risk Prevention Plan  Transportation Screening  Complete  HRI or Home Care Consult Complete   Palliative Care Screening Complete   Medication Review ([place]) Complete Complete  PCP or Specialist appointment within 3-5 days of discharge  Complete  HRI or [place]  Complete  SW Recovery Care/Counseling Consult  Complete  [place]  Not Applicable

## 2022-06-30 NOTE — TOC Progression Note (Addendum)
Transition of Care [facility]) - Progression Note    Patient Details  Name: Matthew [name] MRN: [id number] Date of Birth: 07/06/1951  Transition of Care [facility]) CM/SW Contact  Laurena Slimmer, RN Phone Number: 06/30/2022, 3:59 PM  Clinical Narrative:    Per [facility] portal patient approved 11/17-11/21 # [id number].          Expected Discharge Plan and Services                                                 Social Determinants of Health (SDOH) Interventions    Readmission Risk Interventions    05/04/2022   10:42 AM 10/18/2021   12:39 PM  Readmission Risk Prevention Plan  Transportation Screening  Complete  HRI or Home Care Consult Complete   Palliative Care Screening Complete   Medication Review ([place]) Complete Complete  PCP or Specialist appointment within 3-5 days of discharge  Complete  HRI or [place]  Complete  SW Recovery Care/Counseling Consult  Complete  [place]  Not Applicable

## 2022-06-30 NOTE — Progress Notes (Signed)
Physical Therapy Treatment Patient Details Name: Matthew [name] MRN: [id number] DOB: 08/15/1950 Today's Date: 06/30/2022   History of Present Illness Patient is a 71 year old male admitted from home with knee pain, unable to care for himself. History of  chronic left knee pain secondary to arthritis and old fracture of the distal femur in 05/2020 with hardware, COPD, class IV obesity, CKD stage IV, HTN, A-fib on Eliquis, dementia, systolic CHF.    PT Comments    Pt received supine in bed agreeable to PT. Pt performing LLE therex well without any signs of pain. Pt transfers with supervision and unable to stand to RW with bed in lowered position and modA. Bed elevated with minguard to RW with increased time and VC's for anterior weight shift. In standing pt minimally takes side steps to [facility] toward recliner appearing very laborious and ultimately with LLE buckling requiring quick Recliner retrieval and modA and shift of hips to safely descend into recliner. Pt endorsing L knee pain and weakness as reasons for controlled descent. Pt with all needs in reach in recliner. RN updated on mobility status. D/c recs remain appropriate as pt persists with impairments in pain and LE weakness placing pt at high risk for falls.    Recommendations for follow up therapy are one component of a multi-disciplinary discharge planning process, led by the attending physician.  Recommendations may be updated based on patient status, additional functional criteria and insurance authorization.  Follow Up Recommendations  Skilled nursing-short term rehab (<3 hours/day) Can patient physically be transported by private vehicle: No   Assistance Recommended at Discharge Frequent or constant Supervision/Assistance  Patient can return home with the following A little help with walking and/or transfers;A little help with bathing/dressing/bathroom;Help with stairs or ramp for entrance;Assist for transportation;Assistance with  cooking/housework   Equipment Recommendations  None recommended by PT    Recommendations for Other Services       Precautions / Restrictions Precautions Precautions: Fall Restrictions Weight Bearing Restrictions: No     Mobility  Bed Mobility Overal bed mobility: Needs Assistance Bed Mobility: Supine to Sit     Supine to sit: Supervision       Patient Response: Cooperative  Transfers Overall transfer level: Needs assistance Equipment used: Rolling walker (2 wheels) Transfers: Sit to/from Stand, Bed to chair/wheelchair/BSC Sit to Stand: Min assist, From elevated surface   Step pivot transfers: Mod assist       General transfer comment: Difficulty with lateral steps. ModA on RW and decsent into recliner due to L knee pain and weakness.    Ambulation/Gait Ambulation/Gait assistance: Mod assist Gait Distance (Feet): 2 Feet Assistive device: Rolling walker (2 wheels) Gait Pattern/deviations: Step-to pattern           Stairs             Wheelchair Mobility    Modified Rankin (Stroke Patients Only)       Balance Overall balance assessment: Needs assistance Sitting-balance support: Feet supported Sitting balance-Leahy Scale: Fair     Standing balance support: Bilateral upper extremity supported, Reliant on assistive device for balance Standing balance-Leahy Scale: [place]  Exercises General Exercises - Lower Extremity Ankle Circles/Pumps: AROM, Strengthening, Both, 10 reps, Supine Short Arc Quad: AROM, Strengthening, Left, 10 reps, Supine Long Arc Quad: AROM, Strengthening, Both, 10 reps, Seated Heel Slides: AROM, Strengthening, Left, 10 reps, Supine Hip ABduction/ADduction: AROM, Strengthening, Left, 10 reps, Supine Heel Raises: AROM, Strengthening, Both, 15 reps, Supine (pushing up on foot rest of bed)    General Comments         Pertinent Vitals/Pain Pain Assessment Pain Assessment: Faces Faces Pain Scale: Hurts little more Pain Location: L knee Pain Descriptors / Indicators: Discomfort, Sore Pain Intervention(s): Limited activity within patient's tolerance, Monitored during session, Repositioned    Home Living                          Prior Function            PT Goals (current goals can now be found in the care plan section) Acute Rehab PT Goals Patient Stated Goal: go home with my wife if she is able to return home after admission (pt's spouse is currently admitted and is his caregiver at home. PT Goal Formulation: With patient Time For Goal Achievement: 07/10/22 Potential to Achieve Goals: Fair Progress towards PT goals: Progressing toward goals    Frequency    Min 2X/week      PT Plan Current plan remains appropriate    Co-evaluation              AM-PAC PT "6 Clicks" Mobility   Outcome Measure  Help needed turning from your back to your side while in a flat bed without using bedrails?: A Little Help needed moving from lying on your back to sitting on the side of a flat bed without using bedrails?: A Little   Help needed standing up from a chair using your arms (e.g., wheelchair or bedside chair)?: A Lot Help needed to walk in hospital room?: Total Help needed climbing 3-5 steps with a railing? : Total 6 Click Score: 10    End of Session Equipment Utilized During Treatment: Gait belt Activity Tolerance: Patient limited by fatigue;Patient limited by pain Patient left: in chair;with call bell/phone within reach;with chair alarm set Nurse Communication: Mobility status PT Visit Diagnosis: Unsteadiness on feet (R26.81);Muscle weakness (generalized) (M62.81);Pain Pain - Right/Left: Left Pain - part of body: Knee     Time: 6440-3474 PT Time Calculation (min) (ACUTE ONLY): 23 min  Charges:  $Therapeutic Exercise: 23-37 mins                    Matthew [name] M.  Fairly IV, PT, DPT Physical Therapist- [facility]  06/30/2022, 11:25 AM

## 2022-06-30 NOTE — Plan of Care (Signed)

## 2022-06-30 NOTE — Progress Notes (Signed)
[facility]                                                                               Matthew [name], is a 71 y.o. male, DOB - 1950-09-29, HTD:[id number] Admit date - 06/25/2022    Outpatient Primary MD for the patient is [name], MD  LOS - 5  days    Brief summary   Matthew [name] is a 71 year old male with PMH osteoarthritis, COPD, OSA not on CPAP, obesity, CKD 4, HTN, PAF, dementia, systolic CHF.  Patient presented with worsening pain and swelling in his left knee with difficulty ambulating.  He has also had poor appetite at home and not eating well as his wife has also been currently hospitalized.  He does have some help at home who has been checking on him while he has been alone.  Due to knee pain, he was brought to the hospital for further evaluation. Left knee x-rays were obtained in the ER which were negative for acute fracture.  He does have an underlying chronic fracture of the distal left femur that showed interval healing and no change to the underlying hardware.  Tricompartmental osteoarthritis was also appreciated with a small joint effusion. He was admitted for pain control and PT evaluation.  Therapy eval recommending SNF.  Assessment & Plan    Assessment and Plan: * Osteoarthritis of left knee - History of distal left femur fracture, October 2021 - Underlying lateral fixation plate and screws appreciated on x-ray with no loosening -Continue pain control - Trial of Toradol for swollen left knee - PT recommending SNF, waiting for placement.  No new complaints.   Physical deconditioning - follow up PT/OT evals: SNF recommended  Persistent atrial fibrillation (HCC) - Continue Eliquis , metoprolol discontinued due to bradycardia in 40's. - HR improved to 50/min.  No changes in meds.   Anemia of chronic kidney failure, stage 4 (severe) (HCC) - Baseline hemoglobin around 9 to 10 g/dL - Currently at baseline, possibly some mild  hemoconcentration  CKD (chronic kidney disease) stage 4, GFR 15-29 ml/min (HCC) - patient has history of CKD4. Baseline creat ~ 2.5, eGFR 27 - creatinine at baseline.    Chronic systolic CHF (congestive heart failure) (HCC) Clinically euvolemic EF 35 to 40% in March 2023 Continue  with furosemide  COPD (chronic obstructive pulmonary disease) ([place]) Not acutely exacerbated Patient has used as needed O2 in the past Continue home inhalers.  DuoNebs as needed  Dementia without behavioral disturbance ([place]) Delirium precautions No agitation. He is alert and answering questions appropriately.   OSA (obstructive sleep apnea) CPAP if desired  Psychiatry consulted for capacity evaluation, for disposition.  Pt has dementia with memory issues.    Estimated body mass index is 32.1 kg/m as calculated from the following:   Height as of this encounter: _0  (1.88 m).   Weight as of this encounter: 113.4 kg.  Code Status: full code.  DVT Prophylaxis:   apixaban (ELIQUIS) tablet 5 mg   Level of Care: Level of care: Med-Surg Family Communication: none at bedside.   Disposition Plan:     Remains inpatient appropriate:  SNF placement.   Procedures:  None.   Consultants:   None.   Antimicrobials:   Anti-infectives (From admission, onward)    None        Medications  Scheduled Meds:  apixaban  5 mg Oral BID   DULoxetine  60 mg Oral Daily   furosemide  20 mg Oral Daily   ketorolac  15 mg Intravenous Q6H   mometasone-formoterol  2 puff Inhalation BID   Continuous Infusions: PRN Meds:.acetaminophen **OR** acetaminophen, ALPRAZolam, HYDROcodone-acetaminophen, ipratropium-albuterol, ondansetron **OR** ondansetron (ZOFRAN) IV, polyethylene glycol    Subjective:   Matthew [name] was seen and examined today.  No complaints.   Objective:   Vitals:   06/29/22 0810 06/29/22 1605 06/29/22 2314 06/30/22 0717  BP: 123/69 136/67 128/65 (!) 143/70  Pulse: (!) 55 (!) 56 (!) 50  (!) 55  Resp: _0 Temp: 98.2 F (36.8 C) 98.3 F (36.8 C) 97.8 F (36.6 C) 98.3 F (36.8 C)  TempSrc:      SpO2: 97% 97% 98% 99%  Weight:      Height:        Intake/Output Summary (Last 24 hours) at 06/30/2022 1135 Last data filed at 06/30/2022 0600 Gross per 24 hour  Intake 480 ml  Output 1800 ml  Net -1320 ml    Filed Weights   06/25/22 1451  Weight: 113.4 kg     Exam General exam: Appears calm and comfortable  Respiratory system: Clear to auscultation. Respiratory effort normal. Cardiovascular system: S1 & S2 heard, RRR. No JVD, murmurs,  Gastrointestinal system: Abdomen is nondistended, soft and nontender. Central nervous system: Alert and oriented. No focal neurological deficits. Extremities: Symmetric 5 x 5 power. Skin: No rashes, lesions or ulcers Psychiatry: Judgement and insight appear normal. Mood & affect appropriate.     Data Reviewed:  I have personally reviewed following labs and imaging studies   CBC Lab Results  Component Value Date   WBC 6.4 06/25/2022   RBC 3.82 (L) 06/25/2022   HGB 11.1 (L) 06/25/2022   HCT 37.6 (L) 06/25/2022   MCV 98.4 06/25/2022   MCH 29.1 06/25/2022   PLT 256 06/25/2022   MCHC 29.5 (L) 06/25/2022   RDW 14.4 06/25/2022   LYMPHSABS 1.8 06/25/2022   MONOABS 0.8 06/25/2022   EOSABS 0.1 06/25/2022   BASOSABS 0.0 97/35/3299     Last metabolic panel Lab Results  Component Value Date   NA 139 06/25/2022   K 4.6 06/25/2022   CL 108 06/25/2022   CO2 27 06/25/2022   BUN 27 (H) 06/25/2022   CREATININE 2.22 (H) 06/25/2022   GLUCOSE 117 (H) 06/25/2022   GFRNONAA 31 (L) 06/25/2022   GFRAA 31 (L) 05/18/2020   CALCIUM 9.4 06/25/2022   PHOS 3.4 05/11/2022   PROT 6.6 06/25/2022   ALBUMIN 2.7 (L) 06/25/2022   BILITOT 0.5 06/25/2022   ALKPHOS 89 06/25/2022   AST 12 (L) 06/25/2022   ALT 8 06/25/2022   ANIONGAP 4 (L) 06/25/2022    CBG (last 3)  No results for input(s): "GLUCAP" in the last 72 hours.     Coagulation Profile: No results for input(s): "INR", "PROTIME" in the last 168 hours.   Radiology Studies: No results found.     Hosie Poisson M.D. [facility] 06/30/2022, 11:35 AM  Available via Epic secure chat 7am-7pm After 7 pm, please refer to night coverage provider listed on amion.

## 2022-07-01 DIAGNOSIS — M8440XA Pathological fracture, unspecified site, initial encounter for fracture: Secondary | ICD-10-CM

## 2022-07-01 DIAGNOSIS — Z515 Encounter for palliative care: Secondary | ICD-10-CM

## 2022-07-01 DIAGNOSIS — Z602 Problems related to living alone: Secondary | ICD-10-CM | POA: Diagnosis not present

## 2022-07-01 DIAGNOSIS — J432 Centrilobular emphysema: Secondary | ICD-10-CM | POA: Diagnosis not present

## 2022-07-01 DIAGNOSIS — M25562 Pain in left knee: Secondary | ICD-10-CM | POA: Diagnosis not present

## 2022-07-01 DIAGNOSIS — G4733 Obstructive sleep apnea (adult) (pediatric): Secondary | ICD-10-CM

## 2022-07-01 NOTE — Plan of Care (Signed)

## 2022-07-01 NOTE — Progress Notes (Signed)
Patient has scattered abrasions bleeding due to him picking. The one on the back of the ear was bleeding, dripping down to his neck. Cleaned area and put a gauze on it and covered it with Tegaderm.

## 2022-07-01 NOTE — Progress Notes (Signed)
[facility]                                                                               Matthew [name], is a 71 y.o. male, DOB - [birth year], DSK:[id number] Admit date - 06/25/2022    Outpatient Primary MD for the patient is Matthew Athens, MD  LOS - 6  days    Brief summary   [name] is a 71 year old male with PMH osteoarthritis, COPD, OSA not on CPAP, obesity, CKD 4, HTN, PAF, dementia, systolic CHF.  Patient presented with worsening pain and swelling in his left knee with difficulty ambulating.  He has also had poor appetite at home and not eating well as his wife has also been currently hospitalized.  He does have some help at home who has been checking on him while he has been alone.  Due to knee pain, he was brought to the hospital for further evaluation. Left knee x-rays were obtained in the ER which were negative for acute fracture.  He does have an underlying chronic fracture of the distal left femur that showed interval healing and no change to the underlying hardware.  Tricompartmental osteoarthritis was also appreciated with a small joint effusion. He was admitted for pain control and PT evaluation.  Therapy eval recommending SNF.  Assessment & Plan    Assessment and Plan: * Osteoarthritis of left knee - History of distal left femur fracture, October 2021 - Underlying lateral fixation plate and screws appreciated on x-ray with no loosening -Continue pain control - Trial of Toradol for swollen left knee - PT recommending SNF,  No new complaints, waiting for SNF.   Physical deconditioning - follow up PT/OT evals: SNF recommended  Persistent atrial fibrillation (HCC) - Continue Eliquis , metoprolol discontinued due to bradycardia in 40's. - HR improved to 50/min.  No changes in meds.   Anemia of chronic kidney failure, stage 4 (severe) (HCC) - Baseline hemoglobin around 9 to 10 g/dL - Currently at baseline, possibly some mild hemoconcentration  CKD  (chronic kidney disease) stage 4, GFR 15-29 ml/min (HCC) - patient has history of CKD4. Baseline creat ~ 2.5, eGFR 27 - creatinine at baseline.    Chronic systolic CHF (congestive heart failure) (HCC) Clinically euvolemic EF 35 to 40% in March 2023 Continue  with furosemide  COPD (chronic obstructive pulmonary disease) ([place]) Not acutely exacerbated Patient has used as needed O2 in the past Continue home inhalers.  DuoNebs as needed  Dementia without behavioral disturbance ([place]) Delirium precautions No agitation. He is alert and answering questions appropriately.   OSA (obstructive sleep apnea) CPAP if desired  Psychiatry consulted for capacity evaluation, for disposition.  Pt has dementia with memory issues. Pt does not have capacity to care for himself at home.  He is agreeable to SNF. He would need 24 hour care for safety.    Estimated body mass index is 32.1 kg/m as calculated from the following:   Height as of this encounter: _0  (1.88 m).   Weight as of this encounter: 113.4 kg.  Code Status: full code.  DVT Prophylaxis:   apixaban (ELIQUIS) tablet 5 mg   Level of Care: Level  of care: Med-Surg Family Communication: none at bedside.   Disposition Plan:     Remains inpatient appropriate:  SNF placement.   Procedures:  None.   Consultants:   None.   Antimicrobials:   Anti-infectives (From admission, onward)    None        Medications  Scheduled Meds:  apixaban  5 mg Oral BID   DULoxetine  60 mg Oral Daily   furosemide  20 mg Oral Daily   mometasone-formoterol  2 puff Inhalation BID   Continuous Infusions: PRN Meds:.acetaminophen **OR** acetaminophen, ALPRAZolam, HYDROcodone-acetaminophen, ipratropium-albuterol, ondansetron **OR** ondansetron (ZOFRAN) IV, polyethylene glycol    Subjective:   [name] was seen and examined today.  No new complaints.   Objective:   Vitals:   06/30/22 1404 06/30/22 2006 07/01/22 0519 07/01/22 0732  BP:  135/66 123/63 136/78 (!) 149/61  Pulse: (!) 55 (!) 57 (!) 47 (!) 46  Resp: _0 Temp: 98.2 F (36.8 C) 98 F (36.7 C) 98 F (36.7 C) 97.6 F (36.4 C)  TempSrc:      SpO2: 96% 98% 98% 100%  Weight:      Height:        Intake/Output Summary (Last 24 hours) at 07/01/2022 1216 Last data filed at 07/01/2022 1009 Gross per 24 hour  Intake --  Output 1925 ml  Net -1925 ml    Filed Weights   06/25/22 1451  Weight: 113.4 kg     Exam General exam: Appears calm and comfortable  Respiratory system: Clear to auscultation. Respiratory effort normal. Cardiovascular system: S1 & S2 heard, RRR. No JVD, murmurs, Gastrointestinal system: Abdomen is nondistended, soft and nontender. Central nervous system: Alert and oriented to person and place. No focal neurological deficits. Extremities: Symmetric 5 x 5 power. Skin: No rashes, lesions or ulcers Psychiatry: Mood & affect appropriate.      Data Reviewed:  I have personally reviewed following labs and imaging studies   CBC Lab Results  Component Value Date   WBC 6.4 06/25/2022   RBC 3.82 (L) 06/25/2022   HGB 11.1 (L) 06/25/2022   HCT 37.6 (L) 06/25/2022   MCV 98.4 06/25/2022   MCH 29.1 06/25/2022   PLT 256 06/25/2022   MCHC 29.5 (L) 06/25/2022   RDW 14.4 06/25/2022   LYMPHSABS 1.8 06/25/2022   MONOABS 0.8 06/25/2022   EOSABS 0.1 06/25/2022   BASOSABS 0.0 06/16/1593     Last metabolic panel Lab Results  Component Value Date   NA 139 06/25/2022   K 4.6 06/25/2022   CL 108 06/25/2022   CO2 27 06/25/2022   BUN 27 (H) 06/25/2022   CREATININE 2.22 (H) 06/25/2022   GLUCOSE 117 (H) 06/25/2022   GFRNONAA 31 (L) 06/25/2022   GFRAA 31 (L) 05/18/2020   CALCIUM 9.4 06/25/2022   PHOS 3.4 05/11/2022   PROT 6.6 06/25/2022   ALBUMIN 2.7 (L) 06/25/2022   BILITOT 0.5 06/25/2022   ALKPHOS 89 06/25/2022   AST 12 (L) 06/25/2022   ALT 8 06/25/2022   ANIONGAP 4 (L) 06/25/2022    CBG (last 3)  No results for input(s):  "GLUCAP" in the last 72 hours.    Coagulation Profile: No results for input(s): "INR", "PROTIME" in the last 168 hours.   Radiology Studies: No results found.     Hosie Poisson M.D. [facility] 07/01/2022, 12:16 PM  Available via Epic secure chat 7am-7pm After 7 pm, please refer to night coverage provider listed on amion.

## 2022-07-02 DIAGNOSIS — M8440XA Pathological fracture, unspecified site, initial encounter for fracture: Secondary | ICD-10-CM | POA: Diagnosis not present

## 2022-07-02 DIAGNOSIS — J432 Centrilobular emphysema: Secondary | ICD-10-CM | POA: Diagnosis not present

## 2022-07-02 DIAGNOSIS — M25562 Pain in left knee: Secondary | ICD-10-CM | POA: Diagnosis not present

## 2022-07-02 DIAGNOSIS — Z602 Problems related to living alone: Secondary | ICD-10-CM | POA: Diagnosis not present

## 2022-07-02 NOTE — Progress Notes (Signed)
[facility]                                                                               Matthew [name], is a 71 y.o. male, DOB - Mar 26, 1951, VAN:[id number] Admit date - 06/25/2022    Outpatient Primary MD for the patient is [name], MD  LOS - 7  days    Brief summary   Matthew [name] is a 71 year old male with PMH osteoarthritis, COPD, OSA not on CPAP, obesity, CKD 4, HTN, PAF, dementia, systolic CHF.  Patient presented with worsening pain and swelling in his left knee with difficulty ambulating.  He has also had poor appetite at home and not eating well as his wife has also been currently hospitalized.  He does have some help at home who has been checking on him while he has been alone.  Due to knee pain, he was brought to the hospital for further evaluation. Left knee x-rays were obtained in the ER which were negative for acute fracture.  He does have an underlying chronic fracture of the distal left femur that showed interval healing and no change to the underlying hardware.  Tricompartmental osteoarthritis was also appreciated with a small joint effusion. He was admitted for pain control and PT evaluation.  Therapy eval recommending SNF.  Assessment & Plan    Assessment and Plan: * Osteoarthritis of left knee - History of distal left femur fracture, October 2021 - Underlying lateral fixation plate and screws appreciated on x-ray with no loosening -Continue pain control - Trial of Toradol for swollen left knee - PT recommending SNF,  No new complaints, waiting for SNF.   Physical deconditioning - follow up PT/OT evals: SNF recommended  Persistent atrial fibrillation (HCC) - Continue Eliquis , metoprolol discontinued due to bradycardia in 40's. - HR improved to 50/min.  No changes in meds.   Anemia of chronic kidney failure, stage 4 (severe) (HCC) - Baseline hemoglobin around 9 to 10 g/dL - Currently at baseline, possibly some mild hemoconcentration  CKD  (chronic kidney disease) stage 4, GFR 15-29 ml/min (HCC) - patient has history of CKD4. Baseline creat ~ 2.5, eGFR 27 - creatinine at baseline.    Chronic systolic CHF (congestive heart failure) (HCC) Clinically euvolemic EF 35 to 40% in March 2023 Continue  with furosemide  COPD (chronic obstructive pulmonary disease) ([place]) Not acutely exacerbated Patient has used as needed O2 in the past Continue home inhalers.  DuoNebs as needed  Dementia without behavioral disturbance ([place]) Delirium precautions No agitation. He is alert and answering questions appropriately.   OSA (obstructive sleep apnea) CPAP if desired   Abrasions over the right side of the face, and behind the ear. From picking .  Bandaged.   Psychiatry consulted for capacity evaluation, for disposition.  Pt has dementia with memory issues. Pt does not have capacity to care for himself at home.  He is agreeable to SNF. He would need 24 hour care for safety.    Estimated body mass index is 32.1 kg/m as calculated from the following:   Height as of this encounter: _0  (1.88 m).   Weight as of this encounter: 113.4 kg.  Code Status: full code.  DVT Prophylaxis:   apixaban (ELIQUIS) tablet 5 mg   Level of Care: Level of care: Med-Surg Family Communication: none at bedside.   Disposition Plan:     Remains inpatient appropriate:  SNF placement.   Procedures:  None.   Consultants:   None.   Antimicrobials:   Anti-infectives (From admission, onward)    None        Medications  Scheduled Meds:  apixaban  5 mg Oral BID   DULoxetine  60 mg Oral Daily   furosemide  20 mg Oral Daily   mometasone-formoterol  2 puff Inhalation BID   Continuous Infusions: PRN Meds:.acetaminophen **OR** acetaminophen, ALPRAZolam, HYDROcodone-acetaminophen, ipratropium-albuterol, ondansetron **OR** ondansetron (ZOFRAN) IV, polyethylene glycol    Subjective:   Matthew [name] was seen and examined today.  Requesting for  band aid.   Objective:   Vitals:   07/01/22 1537 07/01/22 2226 07/02/22 0232 07/02/22 0904  BP: 129/66 (!) 150/88 (!) 147/76 138/67  Pulse: (!) 57 (!) 54 (!) 50 (!) 53  Resp: _0 Temp: (!) 97.5 F (36.4 C) 98 F (36.7 C) 98 F (36.7 C) 97.6 F (36.4 C)  TempSrc:      SpO2: 99% 99% 97% 98%  Weight:      Height:        Intake/Output Summary (Last 24 hours) at 07/02/2022 1142 Last data filed at 07/01/2022 2225 Gross per 24 hour  Intake --  Output 800 ml  Net -800 ml    Filed Weights   06/25/22 1451  Weight: 113.4 kg     Exam General exam: Deconditioned and ill appearing gentleman with facial abrasions.  Respiratory system: Clear to auscultation. Respiratory effort normal. Cardiovascular system: S1 & S2 heard, RRR. No JVD, No pedal edema. Gastrointestinal system: Abdomen is nondistended, soft and nontender.  Central nervous system: Alert and oriented. No focal neurological deficits. Extremities: Symmetric 5 x 5 power. Skin: No rashes, lesions or ulcers Psychiatry:  Mood & affect appropriate.       Data Reviewed:  I have personally reviewed following labs and imaging studies   CBC Lab Results  Component Value Date   WBC 6.4 06/25/2022   RBC 3.82 (L) 06/25/2022   HGB 11.1 (L) 06/25/2022   HCT 37.6 (L) 06/25/2022   MCV 98.4 06/25/2022   MCH 29.1 06/25/2022   PLT 256 06/25/2022   MCHC 29.5 (L) 06/25/2022   RDW 14.4 06/25/2022   LYMPHSABS 1.8 06/25/2022   MONOABS 0.8 06/25/2022   EOSABS 0.1 06/25/2022   BASOSABS 0.0 40/34/7425     Last metabolic panel Lab Results  Component Value Date   NA 139 06/25/2022   K 4.6 06/25/2022   CL 108 06/25/2022   CO2 27 06/25/2022   BUN 27 (H) 06/25/2022   CREATININE 2.22 (H) 06/25/2022   GLUCOSE 117 (H) 06/25/2022   GFRNONAA 31 (L) 06/25/2022   GFRAA 31 (L) 05/18/2020   CALCIUM 9.4 06/25/2022   PHOS 3.4 05/11/2022   PROT 6.6 06/25/2022   ALBUMIN 2.7 (L) 06/25/2022   BILITOT 0.5 06/25/2022   ALKPHOS  89 06/25/2022   AST 12 (L) 06/25/2022   ALT 8 06/25/2022   ANIONGAP 4 (L) 06/25/2022    CBG (last 3)  No results for input(s): "GLUCAP" in the last 72 hours.    Coagulation Profile: No results for input(s): "INR", "PROTIME" in the last 168 hours.   Radiology Studies: No results found.     Hosie Poisson  M.D. [facility] 07/02/2022, 11:42 AM  Available via Epic secure chat 7am-7pm After 7 pm, please refer to night coverage provider listed on amion.

## 2022-07-02 NOTE — Plan of Care (Signed)

## 2022-07-03 DIAGNOSIS — Z741 Need for assistance with personal care: Secondary | ICD-10-CM | POA: Diagnosis not present

## 2022-07-03 DIAGNOSIS — M25569 Pain in unspecified knee: Secondary | ICD-10-CM | POA: Diagnosis not present

## 2022-07-03 DIAGNOSIS — F0390 Unspecified dementia without behavioral disturbance: Secondary | ICD-10-CM | POA: Diagnosis not present

## 2022-07-03 DIAGNOSIS — M25461 Effusion, right knee: Secondary | ICD-10-CM | POA: Diagnosis not present

## 2022-07-03 DIAGNOSIS — Z7401 Bed confinement status: Secondary | ICD-10-CM | POA: Diagnosis not present

## 2022-07-03 DIAGNOSIS — E119 Type 2 diabetes mellitus without complications: Secondary | ICD-10-CM | POA: Diagnosis not present

## 2022-07-03 DIAGNOSIS — F32A Depression, unspecified: Secondary | ICD-10-CM | POA: Diagnosis not present

## 2022-07-03 DIAGNOSIS — W1839XA Other fall on same level, initial encounter: Secondary | ICD-10-CM | POA: Diagnosis not present

## 2022-07-03 DIAGNOSIS — R5381 Other malaise: Secondary | ICD-10-CM | POA: Diagnosis not present

## 2022-07-03 DIAGNOSIS — S80919A Unspecified superficial injury of unspecified knee, initial encounter: Secondary | ICD-10-CM | POA: Diagnosis not present

## 2022-07-03 DIAGNOSIS — R609 Edema, unspecified: Secondary | ICD-10-CM | POA: Diagnosis not present

## 2022-07-03 DIAGNOSIS — J449 Chronic obstructive pulmonary disease, unspecified: Secondary | ICD-10-CM | POA: Diagnosis not present

## 2022-07-03 DIAGNOSIS — Z043 Encounter for examination and observation following other accident: Secondary | ICD-10-CM | POA: Diagnosis not present

## 2022-07-03 DIAGNOSIS — R45851 Suicidal ideations: Secondary | ICD-10-CM | POA: Diagnosis not present

## 2022-07-03 DIAGNOSIS — F332 Major depressive disorder, recurrent severe without psychotic features: Secondary | ICD-10-CM | POA: Diagnosis not present

## 2022-07-03 DIAGNOSIS — M25562 Pain in left knee: Secondary | ICD-10-CM | POA: Diagnosis not present

## 2022-07-03 DIAGNOSIS — M1711 Unilateral primary osteoarthritis, right knee: Secondary | ICD-10-CM | POA: Diagnosis not present

## 2022-07-03 DIAGNOSIS — J45909 Unspecified asthma, uncomplicated: Secondary | ICD-10-CM | POA: Diagnosis not present

## 2022-07-03 DIAGNOSIS — I517 Cardiomegaly: Secondary | ICD-10-CM | POA: Diagnosis not present

## 2022-07-03 DIAGNOSIS — M25561 Pain in right knee: Secondary | ICD-10-CM | POA: Diagnosis not present

## 2022-07-03 DIAGNOSIS — M1712 Unilateral primary osteoarthritis, left knee: Secondary | ICD-10-CM | POA: Diagnosis not present

## 2022-07-03 DIAGNOSIS — F424 Excoriation (skin-picking) disorder: Secondary | ICD-10-CM | POA: Diagnosis not present

## 2022-07-03 DIAGNOSIS — I4819 Other persistent atrial fibrillation: Secondary | ICD-10-CM | POA: Diagnosis not present

## 2022-07-03 DIAGNOSIS — R41841 Cognitive communication deficit: Secondary | ICD-10-CM | POA: Diagnosis not present

## 2022-07-03 DIAGNOSIS — M8440XA Pathological fracture, unspecified site, initial encounter for fracture: Secondary | ICD-10-CM | POA: Diagnosis not present

## 2022-07-03 DIAGNOSIS — F1011 Alcohol abuse, in remission: Secondary | ICD-10-CM | POA: Diagnosis not present

## 2022-07-03 DIAGNOSIS — F5105 Insomnia due to other mental disorder: Secondary | ICD-10-CM | POA: Diagnosis not present

## 2022-07-03 DIAGNOSIS — R0689 Other abnormalities of breathing: Secondary | ICD-10-CM | POA: Diagnosis not present

## 2022-07-03 DIAGNOSIS — I10 Essential (primary) hypertension: Secondary | ICD-10-CM | POA: Diagnosis not present

## 2022-07-03 DIAGNOSIS — J432 Centrilobular emphysema: Secondary | ICD-10-CM | POA: Diagnosis not present

## 2022-07-03 DIAGNOSIS — Y92129 Unspecified place in nursing home as the place of occurrence of the external cause: Secondary | ICD-10-CM | POA: Diagnosis not present

## 2022-07-03 DIAGNOSIS — M79605 Pain in left leg: Secondary | ICD-10-CM | POA: Diagnosis not present

## 2022-07-03 DIAGNOSIS — I129 Hypertensive chronic kidney disease with stage 1 through stage 4 chronic kidney disease, or unspecified chronic kidney disease: Secondary | ICD-10-CM | POA: Diagnosis not present

## 2022-07-03 DIAGNOSIS — M25462 Effusion, left knee: Secondary | ICD-10-CM | POA: Diagnosis not present

## 2022-07-03 DIAGNOSIS — R531 Weakness: Secondary | ICD-10-CM | POA: Diagnosis not present

## 2022-07-03 DIAGNOSIS — N184 Chronic kidney disease, stage 4 (severe): Secondary | ICD-10-CM | POA: Diagnosis not present

## 2022-07-03 DIAGNOSIS — G4733 Obstructive sleep apnea (adult) (pediatric): Secondary | ICD-10-CM | POA: Diagnosis not present

## 2022-07-03 DIAGNOSIS — Z602 Problems related to living alone: Secondary | ICD-10-CM | POA: Diagnosis not present

## 2022-07-03 DIAGNOSIS — W19XXXA Unspecified fall, initial encounter: Secondary | ICD-10-CM | POA: Diagnosis not present

## 2022-07-03 DIAGNOSIS — Y9301 Activity, walking, marching and hiking: Secondary | ICD-10-CM | POA: Diagnosis not present

## 2022-07-03 DIAGNOSIS — F411 Generalized anxiety disorder: Secondary | ICD-10-CM | POA: Diagnosis not present

## 2022-07-03 DIAGNOSIS — E440 Moderate protein-calorie malnutrition: Secondary | ICD-10-CM | POA: Diagnosis not present

## 2022-07-03 DIAGNOSIS — R5383 Other fatigue: Secondary | ICD-10-CM | POA: Diagnosis not present

## 2022-07-03 DIAGNOSIS — R410 Disorientation, unspecified: Secondary | ICD-10-CM | POA: Diagnosis not present

## 2022-07-03 DIAGNOSIS — E669 Obesity, unspecified: Secondary | ICD-10-CM | POA: Diagnosis not present

## 2022-07-03 DIAGNOSIS — I5022 Chronic systolic (congestive) heart failure: Secondary | ICD-10-CM | POA: Diagnosis not present

## 2022-07-03 DIAGNOSIS — I482 Chronic atrial fibrillation, unspecified: Secondary | ICD-10-CM | POA: Diagnosis not present

## 2022-07-03 DIAGNOSIS — N189 Chronic kidney disease, unspecified: Secondary | ICD-10-CM | POA: Diagnosis not present

## 2022-07-03 DIAGNOSIS — M6281 Muscle weakness (generalized): Secondary | ICD-10-CM | POA: Diagnosis not present

## 2022-07-03 MED ORDER — HYDROXYZINE HCL 10 MG PO TABS: 10.0000 mg | ORAL_TABLET | Freq: Three times a day (TID) | ORAL | Status: DC | PRN

## 2022-07-03 MED ORDER — DIPHENHYDRAMINE-ZINC ACETATE 2-0.1 % EX CREA: TOPICAL_CREAM | Freq: Three times a day (TID) | CUTANEOUS | Status: DC | PRN

## 2022-07-03 MED ORDER — HYDROXYZINE HCL 10 MG PO TABS: 10.0000 mg | ORAL_TABLET | Freq: Three times a day (TID) | ORAL | 0 refills | Status: DC | PRN

## 2022-07-03 NOTE — TOC Transition Note (Addendum)
Transition of Care [facility]) - CM/SW Discharge Note   Patient Details  Name: Matthew [name] MRN: [id number] Date of Birth: 1951-01-13  [facility] of [facility] CM/SW Contact:  Tiburcio Bash, LCSW Phone Number: 07/03/2022, 4:43 PM   Clinical Narrative:     Patient will DC to: [place] date: 07/03/22 Transport by: [facility] Family updated: daughter [name] at [phone]  Per MD patient ready for DC to [facility] . RN, patient, patient's family, and facility notified of DC. Discharge Summary sent to facility. RN given number for report  [phone] Room 21B. DC packet on chart. Ambulance transport requested for patient.  CSW signing off.  Pricilla Riffle, LCSW    Final next level of care: Skilled Nursing Facility Barriers to Discharge: No Barriers Identified   Patient Goals and CMS Choice Patient states their goals for this hospitalization and ongoing recovery are:: to go home CMS Medicare.gov Compare Post Acute Care list provided to:: Patient Choice offered to / list presented to : Patient  Discharge Placement              Patient chooses bed at: [facility] Patient to be transferred to facility by: [facility]   Patient and family notified of of transfer: 07/03/22  Discharge Plan and Services                                     Social Determinants of Health (SDOH) Interventions     Readmission Risk Interventions    05/04/2022   10:42 AM 10/18/2021   12:39 PM  Readmission Risk Prevention Plan  Transportation Screening  Complete  HRI or Home Care Consult Complete   Palliative Care Screening Complete   Medication Review ([place]) Complete Complete  PCP or Specialist appointment within 3-5 days of discharge  Complete  HRI or [place]  Complete  SW Recovery Care/Counseling Consult  Complete  [place]  Not Applicable

## 2022-07-03 NOTE — Progress Notes (Signed)
Physical Therapy Treatment Patient Details Name: Matthew [name] MRN: [id number] DOB: February 04, 1951 Today's Date: 07/03/2022   History of Present Illness Patient is a 71 year old male admitted from home with knee pain, unable to care for himself. History of  chronic left knee pain secondary to arthritis and old fracture of the distal femur in 05/2020 with hardware, COPD, class IV obesity, CKD stage IV, HTN, A-fib on Eliquis, dementia, systolic CHF.    PT Comments    Patient agreeable to PT. He continues to require assistance with bed mobility and transfers. Generalized weakness throughout. Recommend to continue PT to maximize independence and facilitate return to prior level of function. SNF recommended at discharge.    Recommendations for follow up therapy are one component of a multi-disciplinary discharge planning process, led by the attending physician.  Recommendations may be updated based on patient status, additional functional criteria and insurance authorization.  Follow Up Recommendations  Skilled nursing-short term rehab (<3 hours/day) Can patient physically be transported by private vehicle: No   Assistance Recommended at Discharge Frequent or constant Supervision/Assistance  Patient can return home with the following A little help with walking and/or transfers;A little help with bathing/dressing/bathroom;Help with stairs or ramp for entrance;Assist for transportation;Assistance with cooking/housework   Equipment Recommendations  None recommended by PT    Recommendations for Other Services       Precautions / Restrictions Precautions Precautions: Fall Restrictions Weight Bearing Restrictions: No     Mobility  Bed Mobility Overal bed mobility: Needs Assistance Bed Mobility: Supine to Sit     Supine to sit: Min assist     General bed mobility comments: assistance for trunk support to sit upright    Transfers Overall transfer level: Needs assistance Equipment  used: Rolling walker (2 wheels) Transfers: Bed to chair/wheelchair/BSC   Stand pivot transfers: Mod assist, From elevated surface         General transfer comment: verbal cues for technique to facilitate independence with transer    Ambulation/Gait               General Gait Details: not attempted today   Stairs             Wheelchair Mobility    Modified Rankin (Stroke Patients Only)       Balance Overall balance assessment: Needs assistance Sitting-balance support: Feet supported Sitting balance-Leahy Scale: Fair Sitting balance - Comments: no external support required                                    Cognition Arousal/Alertness: Awake/alert Behavior During Therapy: WFL for tasks assessed/performed Overall Cognitive Status: Within Functional Limits for tasks assessed                                          Exercises      General Comments        Pertinent Vitals/Pain Pain Assessment Pain Assessment: Faces Faces Pain Scale: Hurts a little bit Pain Location: L knee Pain Descriptors / Indicators: Discomfort, Sore    Home Living                          Prior Function            PT Goals (current goals can now  be found in the care plan section) Acute Rehab PT Goals Patient Stated Goal: to get stronger PT Goal Formulation: With patient Time For Goal Achievement: 07/10/22 Potential to Achieve Goals: Fair Progress towards PT goals: Progressing toward goals    Frequency    Min 2X/week      PT Plan Current plan remains appropriate    Co-evaluation              AM-PAC PT "6 Clicks" Mobility   Outcome Measure  Help needed turning from your back to your side while in a flat bed without using bedrails?: A Little Help needed moving from lying on your back to sitting on the side of a flat bed without using bedrails?: A Little Help needed moving to and from a bed to a chair (including  a wheelchair)?: A Lot Help needed standing up from a chair using your arms (e.g., wheelchair or bedside chair)?: A Lot Help needed to walk in hospital room?: Total Help needed climbing 3-5 steps with a railing? : Total 6 Click Score: 12    End of Session   Activity Tolerance: Patient tolerated treatment well;Patient limited by fatigue Patient left: in chair;with call bell/phone within reach;with chair alarm set;with nursing/sitter in room Nurse Communication: Mobility status PT Visit Diagnosis: Unsteadiness on feet (R26.81);Muscle weakness (generalized) (M62.81);Pain Pain - Right/Left: Left Pain - part of body: Knee     Time: 2395-3202 PT Time Calculation (min) (ACUTE ONLY): 17 min  Charges:  $Therapeutic Activity: 8-22 mins                     Minna Merritts, PT, MPT    Percell Locus 07/03/2022, 12:40 PM

## 2022-07-03 NOTE — Plan of Care (Signed)

## 2022-07-03 NOTE — Care Management Important Message (Signed)
Important Message  Patient Details  Name: Matthew [name] MRN: [id number] Date of Birth: 1951/03/07   Medicare Important Message Given:  Yes  Daughter, Vianne Bulls returned my call and is in agreement with the discharge for today.    [name] A Caragh Gasper 07/03/2022, 3:28 PM

## 2022-07-03 NOTE — Discharge Summary (Signed)
Physician Discharge Summary   Patient: Matthew [name] MRN: [id number] DOB: 01/17/1951  Admit date:     06/25/2022  Discharge date: 07/03/22  Discharge Physician: Hosie Poisson   PCP: [name], MD   Recommendations at discharge:  Please follow up with PCP in one week.  Please check cbc and bmp in one week.   Discharge Diagnoses: Principal Problem:   Self-care deficit in patient living alone Active Problems:   Osteoarthritis of left knee   Physical deconditioning   Persistent atrial fibrillation (HCC)   CKD (chronic kidney disease) stage 4, GFR 15-29 ml/min (HCC)   Anemia of chronic kidney failure, stage 4 (severe) (HCC)   Chronic systolic CHF (congestive heart failure) (HCC)   COPD (chronic obstructive pulmonary disease) (HCC)   OSA (obstructive sleep apnea)   Chronic fracture distal left femur   Left knee pain   Dementia without behavioral disturbance [facility])    Hospital Course: Matthew [name] is a 71 year old male with PMH osteoarthritis, COPD, OSA not on CPAP, obesity, CKD 4, HTN, PAF, dementia, systolic CHF.  Patient presented with worsening pain and swelling in his left knee with difficulty ambulating.  He has also had poor appetite at home and not eating well as his wife has also been currently hospitalized.  He does have some help at home who has been checking on him while he has been alone.  Due to knee pain, he was brought to the hospital for further evaluation. Left knee x-rays were obtained in the ER which were negative for acute fracture.  He does have an underlying chronic fracture of the distal left femur that showed interval healing and no change to the underlying hardware.  Tricompartmental osteoarthritis was also appreciated with a small joint effusion. He was admitted for pain control and PT evaluation.  Assessment and Plan:    Osteoarthritis of left knee - History of distal left femur fracture, October 2021 - Underlying lateral fixation plate and screws  appreciated on x-ray with no loosening -Continue pain control - Trial of Toradol for swollen left knee - PT recommending SNF,  No new complaints, waiting for SNF.    Physical deconditioning - follow up PT/OT evals: SNF recommended   Persistent atrial fibrillation (HCC) - Continue Eliquis , metoprolol discontinued due to bradycardia in 40's. - HR improved to 50/min.  No changes in meds.    Anemia of chronic kidney failure, stage 4 (severe) (HCC) - Baseline hemoglobin around 9 to 10 g/dL - Currently at baseline, possibly some mild hemoconcentration   CKD (chronic kidney disease) stage 4, GFR 15-29 ml/min (HCC) - patient has history of CKD4. Baseline creat ~ 2.5, eGFR 27 - creatinine at baseline.      Chronic systolic CHF (congestive heart failure) (HCC) Clinically euvolemic EF 35 to 40% in March 2023 Continue  with furosemide   COPD (chronic obstructive pulmonary disease) ([place]) Not acutely exacerbated Patient has used as needed O2 in the past Continue home inhalers.  DuoNebs as needed   Dementia without behavioral disturbance ([place]) Delirium precautions No agitation. He is alert and answering questions appropriately.    OSA (obstructive sleep apnea) CPAP if desired     Abrasions over the right side of the face, and behind the ear. From picking .  Bandaged.    Psychiatry consulted for capacity evaluation, for disposition.  Pt has dementia with memory issues. Pt does not have capacity to care for himself at home.  He is agreeable to SNF. He would need 24  hour care for safety.      Estimated body mass index is 32.1 kg/m as calculated from the following:   Height as of this encounter: _0  (1.88 m).   Weight as of this encounter: 113.4 kg.    Consultants: palliative care.  Procedures performed: none.   Disposition: Skilled nursing facility Diet recommendation:  Discharge Diet Orders (From admission, onward)     Start     Ordered   07/03/22 0000  Diet - low  sodium heart healthy        07/03/22 1221           Cardiac diet DISCHARGE MEDICATION: Allergies as of 07/03/2022       Reactions   Demerol [meperidine Hcl]    Lisinopril Other (See Comments)   Hypotensive    Meperidine And Related    Talwin [pentazocine]         Medication List     STOP taking these medications    metoprolol succinate 25 MG 24 hr tablet Commonly known as: TOPROL-XL       TAKE these medications    albuterol 108 (90 Base) MCG/ACT inhaler Commonly known as: VENTOLIN HFA INHALE 1 PUFF BY MOUTH INTO LUNGS DAILY   apixaban 5 MG Tabs tablet Commonly known as: Eliquis Take 1 tablet (5 mg total) by mouth 2 (two) times daily.   bisacodyl 5 MG EC tablet Commonly known as: DULCOLAX Take 1 tablet (5 mg total) by mouth daily as needed for moderate constipation.   DULoxetine 60 MG capsule Commonly known as: CYMBALTA TAKE 1 CAPSULE(60 MG) BY MOUTH TWICE DAILY   furosemide 20 MG tablet Commonly known as: LASIX Take 1 tablet (20 mg total) by mouth daily.   hydrOXYzine 10 MG tablet Commonly known as: ATARAX Take 1 tablet (10 mg total) by mouth 3 (three) times daily as needed for itching or anxiety.   ipratropium-albuterol 0.5-2.5 (3) MG/3ML Soln Commonly known as: DUONEB USE 1 VIAL VIA NEBULIZER EVERY 6 HOURS AS NEEDED   mometasone-formoterol 200-5 MCG/ACT Aero Commonly known as: DULERA Inhale 2 puffs into the lungs 2 (two) times daily.   polyethylene glycol 17 g packet Commonly known as: MIRALAX / GLYCOLAX Take 17 g by mouth daily as needed for mild constipation or moderate constipation.        Discharge Exam: Filed Weights   06/25/22 1451  Weight: 113.4 kg   General exam: Appears calm and comfortable  Respiratory system: Clear to auscultation. Respiratory effort normal. Cardiovascular system: S1 & S2 heard, RRR. No JVD, murmurs, rubs, gallops or clicks. No pedal edema. Gastrointestinal system: Abdomen is nondistended, soft and  nontender. No organomegaly or masses felt. Normal bowel sounds heard. Central nervous system: Alert and oriented. No focal neurological deficits. Extremities: Symmetric 5 x 5 power. Skin: No rashes, lesions or ulcers Psychiatry: Judgement and insight appear normal. Mood & affect appropriate.    Condition at discharge: fair  The results of significant diagnostics from this hospitalization (including imaging, microbiology, ancillary and laboratory) are listed below for reference.   Imaging Studies: DG Knee Complete 4 Views Left  Result Date: 06/25/2022 CLINICAL DATA:  Left knee pain. EXAM: LEFT KNEE - COMPLETE 4+ VIEW COMPARISON:  05/19/2020. FINDINGS: No acute fracture. Chronic fracture of the distal femur, with bony bridging, but residual fracture lines and displacement. This fracture was acute on the exam dated 05/19/2020. Lateral fixation plate and associated screws appear well seated, and without change from the prior radiographs. Tricompartmental joint spaced narrowing,  moderate of the mediolateral compartments, with associated marginal osteophytes. Small joint effusion. Skeletal structures are diffusely demineralized. Surrounding soft tissues are unremarkable. IMPRESSION: 1. No acute fracture. 2. Chronic fracture of the distal left femur, with interval healing since the study dated 05/19/2020. No change in the left femur lateral fixation plate or associated fixation screws. 3. Tricompartmental osteoarthritis the left knee with associated small joint effusion. Electronically Signed   By: Lajean Manes M.D.   On: 06/25/2022 15:51    Microbiology: Results for orders placed or performed during the hospital encounter of 05/03/22  SARS Coronavirus 2 by RT PCR (hospital order, performed in [facility] hospital lab) *cepheid single result test* Anterior Nasal Swab     Status: None   Collection Time: 05/03/22  4:09 AM   Specimen: Anterior Nasal Swab  Result Value Ref Range Status   SARS  Coronavirus 2 by RT PCR NEGATIVE NEGATIVE Final    Comment: (NOTE) SARS-CoV-2 target nucleic acids are NOT DETECTED.  The SARS-CoV-2 RNA is generally detectable in upper and lower respiratory specimens during the acute phase of infection. The lowest concentration of SARS-CoV-2 viral copies this assay can detect is 250 copies / mL. A negative result does not preclude SARS-CoV-2 infection and should not be used as the sole basis for treatment or other patient management decisions.  A negative result may occur with improper specimen collection / handling, submission of specimen other than nasopharyngeal swab, presence of viral mutation(s) within the areas targeted by this assay, and inadequate number of viral copies (<250 copies / mL). A negative result must be combined with clinical observations, patient history, and epidemiological information.  Fact Sheet for Patients:   https://www.patel.info/  Fact Sheet for Healthcare Providers: https://hall.com/  This test is not yet approved or  cleared by the [place] FDA and has been authorized for detection and/or diagnosis of SARS-CoV-2 by FDA under an Emergency Use Authorization (EUA).  This EUA will remain in effect (meaning this test can be used) for the duration of the COVID-19 declaration under Section 564(b)(1) of the Act, 21 U.S.C. section 360bbb-3(b)(1), unless the authorization is terminated or revoked sooner.  Performed at [facility], [address] [place], [place] [address]   Urine Culture     Status: Abnormal   Collection Time: 05/03/22  9:02 AM   Specimen: Urine, Clean Catch  Result Value Ref Range Status   Specimen Description   Final    URINE, CLEAN CATCH Performed at [facility], [address] [place], [place] [address]    Special Requests   Final    NONE Performed at [facility], [address] [place], [place] [address]     Culture (A)  Final    >=100,000 COLONIES/mL ESCHERICHIA COLI Confirmed Extended Spectrum Beta-Lactamase Producer (ESBL).  In bloodstream infections from ESBL organisms, carbapenems are preferred over piperacillin/tazobactam. They are shown to have a lower risk of mortality.    Report Status 05/06/2022 FINAL  Final   Organism ID, Bacteria ESCHERICHIA COLI (A)  Final      Susceptibility   Escherichia coli - MIC*    AMPICILLIN >=32 RESISTANT Resistant     CEFAZOLIN >=64 RESISTANT Resistant     CEFEPIME <=0.12 SENSITIVE Sensitive     CEFTRIAXONE <=0.25 SENSITIVE Sensitive     CIPROFLOXACIN <=0.25 SENSITIVE Sensitive     GENTAMICIN <=1 SENSITIVE Sensitive     IMIPENEM <=0.25 SENSITIVE Sensitive     NITROFURANTOIN <=16 SENSITIVE Sensitive  TRIMETH/SULFA <=20 SENSITIVE Sensitive     AMPICILLIN/SULBACTAM >=32 RESISTANT Resistant     PIP/TAZO <=4 SENSITIVE Sensitive     * >=100,000 COLONIES/mL ESCHERICHIA COLI    Labs: CBC: No results for input(s): "WBC", "NEUTROABS", "HGB", "HCT", "MCV", "PLT" in the last 168 hours. Basic Metabolic Panel: No results for input(s): "NA", "K", "CL", "CO2", "GLUCOSE", "BUN", "CREATININE", "CALCIUM", "MG", "PHOS" in the last 168 hours. Liver Function Tests: No results for input(s): "AST", "ALT", "ALKPHOS", "BILITOT", "PROT", "ALBUMIN" in the last 168 hours. CBG: No results for input(s): "GLUCAP" in the last 168 hours.  Discharge time spent: 42 minutes.   Signed: Hosie Poisson, MD [facility] 07/03/2022

## 2022-07-03 NOTE — Care Management Important Message (Signed)
Important Message  Patient Details  Name: ZAMAR ODWYER MRN: [id number] Date of Birth: 01/26/1951   Medicare Important Message Given:  Other (see comment)  I left a message for the patient's daughter, Vianne Bulls [phone] to review the Important Message from Medicare again. I will await a return call.   [name] A Hoyle Barkdull 07/03/2022, 2:37 PM

## 2022-07-04 ENCOUNTER — Telehealth: Payer: Self-pay

## 2022-07-04 NOTE — Telephone Encounter (Unsigned)
Transition Care Management Unsuccessful Follow-up Telephone Call  Date of discharge and from where:   07/03/2022  Attempts:  1st Attempt  Reason for unsuccessful TCM follow-up call:  Voice mail full [name], [place] Direct Dial [phone]

## 2022-07-05 ENCOUNTER — Telehealth: Payer: Self-pay

## 2022-07-05 DIAGNOSIS — R5381 Other malaise: Secondary | ICD-10-CM | POA: Diagnosis not present

## 2022-07-05 NOTE — Telephone Encounter (Signed)
Transition Care Management Unsuccessful Follow-up Telephone Call  Date of discharge and from where:  [place] 07/03/2022  Attempts:  2nd Attempt  Reason for unsuccessful TCM follow-up call:  Voice mail full [name], [place] [phone]    ATTEMPTS: 3rd attempt  Transition Care Management Unsuccessful Follow-up Telephone Call  Date of discharge and from where:  [place] 07/03/2022  Attempts:  3rd Attempt  Reason for unsuccessful TCM follow-up call:  Voice mail full [name], [place] Direct Dial [phone]

## 2022-07-09 ENCOUNTER — Other Ambulatory Visit: Payer: Self-pay

## 2022-07-09 ENCOUNTER — Emergency Department: Payer: Medicare HMO

## 2022-07-09 ENCOUNTER — Emergency Department
Admission: EM | Admit: 2022-07-09 | Discharge: 2022-07-11 | Disposition: A | Discharge status: Home / Self Care | Payer: Medicare HMO | Attending: Emergency Medicine | Admitting: Emergency Medicine

## 2022-07-09 DIAGNOSIS — M25561 Pain in right knee: Secondary | ICD-10-CM | POA: Diagnosis not present

## 2022-07-09 DIAGNOSIS — M1711 Unilateral primary osteoarthritis, right knee: Secondary | ICD-10-CM | POA: Diagnosis not present

## 2022-07-09 DIAGNOSIS — N189 Chronic kidney disease, unspecified: Secondary | ICD-10-CM | POA: Insufficient documentation

## 2022-07-09 DIAGNOSIS — Z043 Encounter for examination and observation following other accident: Secondary | ICD-10-CM | POA: Diagnosis not present

## 2022-07-09 DIAGNOSIS — Y9301 Activity, walking, marching and hiking: Secondary | ICD-10-CM | POA: Insufficient documentation

## 2022-07-09 DIAGNOSIS — J45909 Unspecified asthma, uncomplicated: Secondary | ICD-10-CM | POA: Insufficient documentation

## 2022-07-09 DIAGNOSIS — I129 Hypertensive chronic kidney disease with stage 1 through stage 4 chronic kidney disease, or unspecified chronic kidney disease: Secondary | ICD-10-CM | POA: Insufficient documentation

## 2022-07-09 DIAGNOSIS — M25462 Effusion, left knee: Secondary | ICD-10-CM | POA: Diagnosis not present

## 2022-07-09 DIAGNOSIS — J449 Chronic obstructive pulmonary disease, unspecified: Secondary | ICD-10-CM | POA: Diagnosis not present

## 2022-07-09 DIAGNOSIS — Y92129 Unspecified place in nursing home as the place of occurrence of the external cause: Secondary | ICD-10-CM | POA: Insufficient documentation

## 2022-07-09 DIAGNOSIS — M25562 Pain in left knee: Secondary | ICD-10-CM | POA: Diagnosis not present

## 2022-07-09 DIAGNOSIS — M25461 Effusion, right knee: Secondary | ICD-10-CM | POA: Diagnosis not present

## 2022-07-09 DIAGNOSIS — W1839XA Other fall on same level, initial encounter: Secondary | ICD-10-CM | POA: Insufficient documentation

## 2022-07-09 MED ORDER — HYDROCODONE-ACETAMINOPHEN 5-325 MG PO TABS: 1.0000 | ORAL_TABLET | Freq: Three times a day (TID) | ORAL | 0 refills | Status: AC | PRN

## 2022-07-09 MED ORDER — HYDROCODONE-ACETAMINOPHEN 5-325 MG PO TABS
1.0000 | ORAL_TABLET | Freq: Once | ORAL | Status: AC
  Administered 2022-07-09: 1 via ORAL
  Filled 2022-07-09: qty 1

## 2022-07-09 MED ORDER — HYDROCODONE-ACETAMINOPHEN 5-325 MG PO TABS: 1.0000 | ORAL_TABLET | Freq: Three times a day (TID) | ORAL | 0 refills | Status: DC | PRN

## 2022-07-09 NOTE — ED Provider Notes (Signed)
[facility] Emergency Department Provider Note     Event Date/Time   First MD Initiated Contact with Patient 07/09/22 2059     (approximate)   History   Fall   HPI  Matthew [name] is a 71 y.o. male with a history of asthma, hypertension, COPD, CKD, A-fib RVR, presents to the ED for evaluation of bilateral knee pain following mechanical fall.  Patient with a history of distal right femur fixator, presents with bilateral knee pain from [employer], via EMS.  Patient apparently lost his balance while walking, and landed on his bilateral knees.  Denies any head injury or LOC.  Denies any injury related to the fall at this time.     Physical Exam   Triage Vital Signs: ED Triage Vitals  Enc Vitals Group     BP 07/09/22 1912 129/61     Pulse Rate 07/09/22 1912 (!) 58     Resp 07/09/22 1912 17     Temp 07/09/22 1912 97.9 F (36.6 C)     Temp src --      SpO2 07/09/22 1912 100 %     Weight 07/09/22 1925 249 lb 12.5 oz (113.3 kg)     Height 07/09/22 1925 '6\' 2"'$  (1.88 m)     Head Circumference --      Peak Flow --      Pain Score 07/09/22 1925 7     Pain Loc --      Pain Edu? --      Excl. in [place]? --     Most recent vital signs: Vitals:   07/09/22 1912  BP: 129/61  Pulse: (!) 58  Resp: 17  Temp: 97.9 F (36.6 C)  SpO2: 100%    General Awake, no distress. NAD CV:  Good peripheral perfusion.  RESP:  Normal effort.  ABD:  No distention.  MSK:  Bilateral knees without obvious deformity, dislocation, or joint effusion.  No obvious erythema, ecchymosis, or abrasions noted.  Patient with full active range of motion bilaterally.  No popliteal space fullness noted.  No calf or Achilles tenderness noted distally, bilaterally.   ED Results / Procedures / Treatments   Labs (all labs ordered are listed, but only abnormal results are displayed) Labs Reviewed - No data to display   EKG   RADIOLOGY  I personally viewed and evaluated these  images as part of my medical decision making, as well as reviewing the written report by the radiologist.  ED Provider Interpretation: no acute findings  DG Knee Complete 4 Views Right  Result Date: 07/09/2022 CLINICAL DATA:  Status post fall. EXAM: RIGHT KNEE - COMPLETE 4+ VIEW COMPARISON:  None Available. FINDINGS: No evidence of an acute fracture or dislocation. Marked severity patellofemoral, medial tibiofemoral and lateral tibiofemoral compartment space narrowing is seen. Marked severity medial and lateral chondrocalcinosis is also noted. There is a small joint effusion. IMPRESSION: 1. Marked severity tricompartmental degenerative changes. 2. Small joint effusion. Electronically Signed   By: Virgina Norfolk M.D.   On: 07/09/2022 21:07   DG Knee Complete 4 Views Left  Result Date: 07/09/2022 CLINICAL DATA:  Status post fall. EXAM: LEFT KNEE - COMPLETE 4+ VIEW COMPARISON:  June 25, 2022 FINDINGS: A large radiopaque fixation plate is seen along the lateral aspect of the mid and distal left femur. Multiple fixation screws are also noted. A stable chronic fracture deformity is seen involving the distal left femoral shaft. There is no evidence of an  acute fracture or dislocation. Marked severity medial and lateral tibiofemoral compartment space narrowing is seen with marked severity patellofemoral narrowing. There is marked severity medial and lateral chondrocalcinosis with medial and lateral marginal osteophyte formation. A moderate to large joint effusion is seen with associated anterior soft tissue swelling. IMPRESSION: 1. No acute fracture. 2. Marked severity degenerative changes with moderate to large joint effusion. 3. Prior open reduction internal fixation of the distal left femur. Electronically Signed   By: Virgina Norfolk M.D.   On: 07/09/2022 20:59     PROCEDURES:  Critical Care performed: No  Procedures   MEDICATIONS ORDERED IN ED: Medications  HYDROcodone-acetaminophen  (NORCO/VICODIN) 5-325 MG per tablet 1 tablet (1 tablet Oral Given 07/09/22 2140)     IMPRESSION / MDM / ASSESSMENT AND PLAN / ED COURSE  I reviewed the triage vital signs and the nursing notes.                              Differential diagnosis includes, but is not limited to, knee sprain, knee contusion, knee fracture, knee dislocation, OA  Patient's presentation is most consistent with acute complicated illness / injury requiring diagnostic workup.  Geriatric patient to the ED for evaluation of acute bilateral knee pain following a mechanical fall.  Patient was evaluated for his complaints in the ED, found to have reassuring exam and workup.  No radiologic evidence of any acute fracture, dislocation, or internal hardware disruption, based on my interpretation of the images.  Patient's diagnosis is consistent with acute on chronic knee pain secondary to mechanical fall. Patient will be discharged home with prescriptions for codon hydrocodone. Patient is to follow up with primary provider or orthopedics as needed or otherwise directed. Patient is given ED precautions to return to the ED for any worsening or new symptoms.     FINAL CLINICAL IMPRESSION(S) / ED DIAGNOSES   Final diagnoses:  Acute pain of both knees     Rx / DC Orders   ED Discharge Orders          Ordered    HYDROcodone-acetaminophen (NORCO) 5-325 MG tablet  3 times daily PRN,   Status:  Discontinued        07/09/22 2152    HYDROcodone-acetaminophen (NORCO) 5-325 MG tablet  3 times daily PRN        07/09/22 2215             Note:  This document was prepared using Dragon voice recognition software and may include unintentional dictation errors.    Melvenia Needles, PA-C 07/09/22 2230    Nance Pear, MD 07/09/22 2236

## 2022-07-09 NOTE — ED Triage Notes (Signed)
Pt to ED from EMS from [facility] healthcare for fall. Pt was walking and lost his balance and fell to his knees. Pt has pain in both knees. Pt is CAOx4 and in no acute distress.

## 2022-07-09 NOTE — ED Notes (Signed)
First Nurse Note: Pt from [facility] for a fall, endorses left knee pain. Chronic left leg pain, states he fell on his left knee.   VSS with EMS.

## 2022-07-09 NOTE — Discharge Instructions (Addendum)
Your exam and x-rays are normal and reassuring at this time.  Only shows underlying arthritis.  Take the pain medicine as needed and follow-up primary provider.  Return to the ED if necessary.

## 2022-07-10 DIAGNOSIS — F32A Depression, unspecified: Secondary | ICD-10-CM | POA: Diagnosis not present

## 2022-07-10 DIAGNOSIS — I517 Cardiomegaly: Secondary | ICD-10-CM | POA: Diagnosis not present

## 2022-07-10 DIAGNOSIS — R531 Weakness: Secondary | ICD-10-CM | POA: Diagnosis not present

## 2022-07-10 DIAGNOSIS — N184 Chronic kidney disease, stage 4 (severe): Secondary | ICD-10-CM | POA: Diagnosis not present

## 2022-07-10 DIAGNOSIS — M25562 Pain in left knee: Secondary | ICD-10-CM | POA: Diagnosis not present

## 2022-07-10 DIAGNOSIS — R5383 Other fatigue: Secondary | ICD-10-CM | POA: Diagnosis not present

## 2022-07-10 DIAGNOSIS — R609 Edema, unspecified: Secondary | ICD-10-CM | POA: Diagnosis not present

## 2022-07-11 DIAGNOSIS — I482 Chronic atrial fibrillation, unspecified: Secondary | ICD-10-CM | POA: Diagnosis not present

## 2022-07-11 DIAGNOSIS — F424 Excoriation (skin-picking) disorder: Secondary | ICD-10-CM | POA: Diagnosis not present

## 2022-07-11 DIAGNOSIS — I10 Essential (primary) hypertension: Secondary | ICD-10-CM | POA: Diagnosis not present

## 2022-07-11 DIAGNOSIS — E669 Obesity, unspecified: Secondary | ICD-10-CM | POA: Diagnosis not present

## 2022-07-14 DIAGNOSIS — F411 Generalized anxiety disorder: Secondary | ICD-10-CM | POA: Diagnosis not present

## 2022-07-14 DIAGNOSIS — F332 Major depressive disorder, recurrent severe without psychotic features: Secondary | ICD-10-CM | POA: Diagnosis not present

## 2022-07-14 DIAGNOSIS — R45851 Suicidal ideations: Secondary | ICD-10-CM | POA: Diagnosis not present

## 2022-07-14 DIAGNOSIS — F1011 Alcohol abuse, in remission: Secondary | ICD-10-CM | POA: Diagnosis not present

## 2022-07-14 DIAGNOSIS — F5105 Insomnia due to other mental disorder: Secondary | ICD-10-CM | POA: Diagnosis not present

## 2022-07-18 ENCOUNTER — Telehealth: Payer: Self-pay

## 2022-07-18 NOTE — Telephone Encounter (Signed)
         Patient  visited [place] on 11/28     Telephone encounter attempt :  1st  A HIPAA compliant voice message was left requesting a return call.  Instructed patient to call back .     [place], Care Management  [phone] [address], [place], [place] [address] Phone: [phone] Email: [email or web address]

## 2022-07-19 ENCOUNTER — Telehealth: Payer: Self-pay

## 2022-07-19 NOTE — Telephone Encounter (Signed)
        Patient  visited [place] on 11/28    Telephone encounter attempt :  2nd  A HIPAA compliant voice message was left requesting a return call.  Instructed patient to call back    [place], [place] Management  [phone] [address], [place], [place] [address] Phone: [phone] Email: [email or web address]

## 2022-07-21 DIAGNOSIS — F424 Excoriation (skin-picking) disorder: Secondary | ICD-10-CM | POA: Diagnosis not present

## 2022-07-21 DIAGNOSIS — E669 Obesity, unspecified: Secondary | ICD-10-CM | POA: Diagnosis not present

## 2022-07-21 DIAGNOSIS — I10 Essential (primary) hypertension: Secondary | ICD-10-CM | POA: Diagnosis not present

## 2022-07-21 DIAGNOSIS — S20411D Abrasion of right back wall of thorax, subsequent encounter: Secondary | ICD-10-CM | POA: Diagnosis not present

## 2022-07-25 DIAGNOSIS — S20411D Abrasion of right back wall of thorax, subsequent encounter: Secondary | ICD-10-CM | POA: Diagnosis not present

## 2022-07-25 DIAGNOSIS — E669 Obesity, unspecified: Secondary | ICD-10-CM | POA: Diagnosis not present

## 2022-07-25 DIAGNOSIS — I10 Essential (primary) hypertension: Secondary | ICD-10-CM | POA: Diagnosis not present

## 2022-07-25 DIAGNOSIS — F424 Excoriation (skin-picking) disorder: Secondary | ICD-10-CM | POA: Diagnosis not present

## 2022-07-26 DIAGNOSIS — F332 Major depressive disorder, recurrent severe without psychotic features: Secondary | ICD-10-CM | POA: Diagnosis not present

## 2022-07-26 DIAGNOSIS — R45851 Suicidal ideations: Secondary | ICD-10-CM | POA: Diagnosis not present

## 2022-07-26 DIAGNOSIS — F411 Generalized anxiety disorder: Secondary | ICD-10-CM | POA: Diagnosis not present

## 2022-07-31 DIAGNOSIS — S20411D Abrasion of right back wall of thorax, subsequent encounter: Secondary | ICD-10-CM | POA: Diagnosis not present

## 2022-07-31 DIAGNOSIS — S40211A Abrasion of right shoulder, initial encounter: Secondary | ICD-10-CM | POA: Diagnosis not present

## 2022-07-31 DIAGNOSIS — E669 Obesity, unspecified: Secondary | ICD-10-CM | POA: Diagnosis not present

## 2022-07-31 DIAGNOSIS — F424 Excoriation (skin-picking) disorder: Secondary | ICD-10-CM | POA: Diagnosis not present

## 2022-08-02 DIAGNOSIS — F332 Major depressive disorder, recurrent severe without psychotic features: Secondary | ICD-10-CM | POA: Diagnosis not present

## 2022-08-02 DIAGNOSIS — F430 Acute stress reaction: Secondary | ICD-10-CM | POA: Diagnosis not present

## 2022-08-04 DIAGNOSIS — L98491 Non-pressure chronic ulcer of skin of other sites limited to breakdown of skin: Secondary | ICD-10-CM | POA: Diagnosis not present

## 2022-08-04 DIAGNOSIS — L98422 Non-pressure chronic ulcer of back with fat layer exposed: Secondary | ICD-10-CM | POA: Diagnosis not present

## 2022-08-04 DIAGNOSIS — L98495 Non-pressure chronic ulcer of skin of other sites with muscle involvement without evidence of necrosis: Secondary | ICD-10-CM | POA: Diagnosis not present

## 2022-08-10 DIAGNOSIS — F424 Excoriation (skin-picking) disorder: Secondary | ICD-10-CM | POA: Diagnosis not present

## 2022-08-10 DIAGNOSIS — S20411D Abrasion of right back wall of thorax, subsequent encounter: Secondary | ICD-10-CM | POA: Diagnosis not present

## 2022-08-10 DIAGNOSIS — F332 Major depressive disorder, recurrent severe without psychotic features: Secondary | ICD-10-CM | POA: Diagnosis not present

## 2022-08-10 DIAGNOSIS — R45851 Suicidal ideations: Secondary | ICD-10-CM | POA: Diagnosis not present

## 2022-08-10 DIAGNOSIS — E669 Obesity, unspecified: Secondary | ICD-10-CM | POA: Diagnosis not present

## 2022-08-10 DIAGNOSIS — F5105 Insomnia due to other mental disorder: Secondary | ICD-10-CM | POA: Diagnosis not present

## 2022-08-10 DIAGNOSIS — F411 Generalized anxiety disorder: Secondary | ICD-10-CM | POA: Diagnosis not present

## 2022-08-10 DIAGNOSIS — S40211A Abrasion of right shoulder, initial encounter: Secondary | ICD-10-CM | POA: Diagnosis not present

## 2022-08-15 DIAGNOSIS — U071 COVID-19: Secondary | ICD-10-CM | POA: Diagnosis not present

## 2022-08-15 DIAGNOSIS — J1282 Pneumonia due to coronavirus disease 2019: Secondary | ICD-10-CM | POA: Diagnosis not present

## 2022-08-16 DIAGNOSIS — J1282 Pneumonia due to coronavirus disease 2019: Secondary | ICD-10-CM | POA: Diagnosis not present

## 2022-08-16 DIAGNOSIS — S40211A Abrasion of right shoulder, initial encounter: Secondary | ICD-10-CM | POA: Diagnosis not present

## 2022-08-16 DIAGNOSIS — U071 COVID-19: Secondary | ICD-10-CM | POA: Diagnosis not present

## 2022-08-16 DIAGNOSIS — F424 Excoriation (skin-picking) disorder: Secondary | ICD-10-CM | POA: Diagnosis not present

## 2022-08-16 DIAGNOSIS — S20411D Abrasion of right back wall of thorax, subsequent encounter: Secondary | ICD-10-CM | POA: Diagnosis not present

## 2022-08-16 DIAGNOSIS — E669 Obesity, unspecified: Secondary | ICD-10-CM | POA: Diagnosis not present

## 2022-08-28 DIAGNOSIS — S40211A Abrasion of right shoulder, initial encounter: Secondary | ICD-10-CM | POA: Diagnosis not present

## 2022-08-28 DIAGNOSIS — E669 Obesity, unspecified: Secondary | ICD-10-CM | POA: Diagnosis not present

## 2022-08-28 DIAGNOSIS — S20411D Abrasion of right back wall of thorax, subsequent encounter: Secondary | ICD-10-CM | POA: Diagnosis not present

## 2022-08-28 DIAGNOSIS — F424 Excoriation (skin-picking) disorder: Secondary | ICD-10-CM | POA: Diagnosis not present

## 2022-08-30 DIAGNOSIS — F411 Generalized anxiety disorder: Secondary | ICD-10-CM | POA: Diagnosis not present

## 2022-08-30 DIAGNOSIS — F430 Acute stress reaction: Secondary | ICD-10-CM | POA: Diagnosis not present

## 2022-08-30 DIAGNOSIS — F332 Major depressive disorder, recurrent severe without psychotic features: Secondary | ICD-10-CM | POA: Diagnosis not present

## 2022-08-31 ENCOUNTER — Other Ambulatory Visit: Payer: Self-pay | Admitting: Nephrology

## 2022-08-31 DIAGNOSIS — N2581 Secondary hyperparathyroidism of renal origin: Secondary | ICD-10-CM | POA: Diagnosis not present

## 2022-08-31 DIAGNOSIS — R109 Unspecified abdominal pain: Secondary | ICD-10-CM | POA: Diagnosis not present

## 2022-08-31 DIAGNOSIS — N184 Chronic kidney disease, stage 4 (severe): Secondary | ICD-10-CM

## 2022-08-31 DIAGNOSIS — I5022 Chronic systolic (congestive) heart failure: Secondary | ICD-10-CM | POA: Diagnosis not present

## 2022-08-31 DIAGNOSIS — D631 Anemia in chronic kidney disease: Secondary | ICD-10-CM | POA: Diagnosis not present

## 2022-09-05 DIAGNOSIS — S20411D Abrasion of right back wall of thorax, subsequent encounter: Secondary | ICD-10-CM | POA: Diagnosis not present

## 2022-09-05 DIAGNOSIS — E669 Obesity, unspecified: Secondary | ICD-10-CM | POA: Diagnosis not present

## 2022-09-05 DIAGNOSIS — S40211A Abrasion of right shoulder, initial encounter: Secondary | ICD-10-CM | POA: Diagnosis not present

## 2022-09-05 DIAGNOSIS — S40211D Abrasion of right shoulder, subsequent encounter: Secondary | ICD-10-CM | POA: Diagnosis not present

## 2022-09-07 DIAGNOSIS — L98492 Non-pressure chronic ulcer of skin of other sites with fat layer exposed: Secondary | ICD-10-CM | POA: Diagnosis not present

## 2022-09-07 DIAGNOSIS — L98491 Non-pressure chronic ulcer of skin of other sites limited to breakdown of skin: Secondary | ICD-10-CM | POA: Diagnosis not present

## 2022-09-07 DIAGNOSIS — L98422 Non-pressure chronic ulcer of back with fat layer exposed: Secondary | ICD-10-CM | POA: Diagnosis not present

## 2022-09-08 DIAGNOSIS — R45851 Suicidal ideations: Secondary | ICD-10-CM | POA: Diagnosis not present

## 2022-09-08 DIAGNOSIS — F332 Major depressive disorder, recurrent severe without psychotic features: Secondary | ICD-10-CM | POA: Diagnosis not present

## 2022-09-08 DIAGNOSIS — F411 Generalized anxiety disorder: Secondary | ICD-10-CM | POA: Diagnosis not present

## 2022-09-08 DIAGNOSIS — F5105 Insomnia due to other mental disorder: Secondary | ICD-10-CM | POA: Diagnosis not present

## 2022-09-13 DIAGNOSIS — F332 Major depressive disorder, recurrent severe without psychotic features: Secondary | ICD-10-CM | POA: Diagnosis not present

## 2022-09-13 DIAGNOSIS — F411 Generalized anxiety disorder: Secondary | ICD-10-CM | POA: Diagnosis not present

## 2022-09-15 DIAGNOSIS — E669 Obesity, unspecified: Secondary | ICD-10-CM | POA: Diagnosis not present

## 2022-09-15 DIAGNOSIS — S40211D Abrasion of right shoulder, subsequent encounter: Secondary | ICD-10-CM | POA: Diagnosis not present

## 2022-09-15 DIAGNOSIS — S40211A Abrasion of right shoulder, initial encounter: Secondary | ICD-10-CM | POA: Diagnosis not present

## 2022-09-15 DIAGNOSIS — Z79899 Other long term (current) drug therapy: Secondary | ICD-10-CM | POA: Diagnosis not present

## 2022-09-15 DIAGNOSIS — S20411D Abrasion of right back wall of thorax, subsequent encounter: Secondary | ICD-10-CM | POA: Diagnosis not present

## 2022-09-18 DIAGNOSIS — S00411A Abrasion of right ear, initial encounter: Secondary | ICD-10-CM | POA: Diagnosis not present

## 2022-09-19 DIAGNOSIS — S40211A Abrasion of right shoulder, initial encounter: Secondary | ICD-10-CM | POA: Diagnosis not present

## 2022-09-19 DIAGNOSIS — S40211D Abrasion of right shoulder, subsequent encounter: Secondary | ICD-10-CM | POA: Diagnosis not present

## 2022-09-19 DIAGNOSIS — E669 Obesity, unspecified: Secondary | ICD-10-CM | POA: Diagnosis not present

## 2022-09-19 DIAGNOSIS — S20411D Abrasion of right back wall of thorax, subsequent encounter: Secondary | ICD-10-CM | POA: Diagnosis not present

## 2022-09-21 DIAGNOSIS — M6281 Muscle weakness (generalized): Secondary | ICD-10-CM | POA: Diagnosis not present

## 2022-09-21 DIAGNOSIS — M1712 Unilateral primary osteoarthritis, left knee: Secondary | ICD-10-CM | POA: Diagnosis not present

## 2022-09-22 DIAGNOSIS — M6281 Muscle weakness (generalized): Secondary | ICD-10-CM | POA: Diagnosis not present

## 2022-09-22 DIAGNOSIS — M1712 Unilateral primary osteoarthritis, left knee: Secondary | ICD-10-CM | POA: Diagnosis not present

## 2022-09-25 DIAGNOSIS — M6281 Muscle weakness (generalized): Secondary | ICD-10-CM | POA: Diagnosis not present

## 2022-09-25 DIAGNOSIS — M1712 Unilateral primary osteoarthritis, left knee: Secondary | ICD-10-CM | POA: Diagnosis not present

## 2022-09-26 DIAGNOSIS — M6281 Muscle weakness (generalized): Secondary | ICD-10-CM | POA: Diagnosis not present

## 2022-09-26 DIAGNOSIS — S40211D Abrasion of right shoulder, subsequent encounter: Secondary | ICD-10-CM | POA: Diagnosis not present

## 2022-09-26 DIAGNOSIS — S20411D Abrasion of right back wall of thorax, subsequent encounter: Secondary | ICD-10-CM | POA: Diagnosis not present

## 2022-09-26 DIAGNOSIS — M1712 Unilateral primary osteoarthritis, left knee: Secondary | ICD-10-CM | POA: Diagnosis not present

## 2022-09-26 DIAGNOSIS — S40211A Abrasion of right shoulder, initial encounter: Secondary | ICD-10-CM | POA: Diagnosis not present

## 2022-09-26 DIAGNOSIS — E669 Obesity, unspecified: Secondary | ICD-10-CM | POA: Diagnosis not present

## 2022-09-27 DIAGNOSIS — F332 Major depressive disorder, recurrent severe without psychotic features: Secondary | ICD-10-CM | POA: Diagnosis not present

## 2022-09-27 DIAGNOSIS — F411 Generalized anxiety disorder: Secondary | ICD-10-CM | POA: Diagnosis not present

## 2022-09-27 DIAGNOSIS — M6281 Muscle weakness (generalized): Secondary | ICD-10-CM | POA: Diagnosis not present

## 2022-09-27 DIAGNOSIS — M1712 Unilateral primary osteoarthritis, left knee: Secondary | ICD-10-CM | POA: Diagnosis not present

## 2022-09-28 DIAGNOSIS — M1712 Unilateral primary osteoarthritis, left knee: Secondary | ICD-10-CM | POA: Diagnosis not present

## 2022-09-28 DIAGNOSIS — C44212 Basal cell carcinoma of skin of right ear and external auricular canal: Secondary | ICD-10-CM | POA: Diagnosis not present

## 2022-09-28 DIAGNOSIS — D485 Neoplasm of uncertain behavior of skin: Secondary | ICD-10-CM | POA: Diagnosis not present

## 2022-09-28 DIAGNOSIS — L570 Actinic keratosis: Secondary | ICD-10-CM | POA: Diagnosis not present

## 2022-09-28 DIAGNOSIS — C44519 Basal cell carcinoma of skin of other part of trunk: Secondary | ICD-10-CM | POA: Diagnosis not present

## 2022-09-28 DIAGNOSIS — M6281 Muscle weakness (generalized): Secondary | ICD-10-CM | POA: Diagnosis not present

## 2022-09-29 DIAGNOSIS — M1712 Unilateral primary osteoarthritis, left knee: Secondary | ICD-10-CM | POA: Diagnosis not present

## 2022-09-29 DIAGNOSIS — Z79899 Other long term (current) drug therapy: Secondary | ICD-10-CM | POA: Diagnosis not present

## 2022-09-29 DIAGNOSIS — M6281 Muscle weakness (generalized): Secondary | ICD-10-CM | POA: Diagnosis not present

## 2022-10-02 DIAGNOSIS — M1712 Unilateral primary osteoarthritis, left knee: Secondary | ICD-10-CM | POA: Diagnosis not present

## 2022-10-02 DIAGNOSIS — M6281 Muscle weakness (generalized): Secondary | ICD-10-CM | POA: Diagnosis not present

## 2022-10-03 DIAGNOSIS — Z760 Encounter for issue of repeat prescription: Secondary | ICD-10-CM | POA: Diagnosis not present

## 2022-10-03 DIAGNOSIS — S40211A Abrasion of right shoulder, initial encounter: Secondary | ICD-10-CM | POA: Diagnosis not present

## 2022-10-03 DIAGNOSIS — M6281 Muscle weakness (generalized): Secondary | ICD-10-CM | POA: Diagnosis not present

## 2022-10-03 DIAGNOSIS — M1712 Unilateral primary osteoarthritis, left knee: Secondary | ICD-10-CM | POA: Diagnosis not present

## 2022-10-03 DIAGNOSIS — E669 Obesity, unspecified: Secondary | ICD-10-CM | POA: Diagnosis not present

## 2022-10-03 DIAGNOSIS — S20411D Abrasion of right back wall of thorax, subsequent encounter: Secondary | ICD-10-CM | POA: Diagnosis not present

## 2022-10-03 DIAGNOSIS — S00411A Abrasion of right ear, initial encounter: Secondary | ICD-10-CM | POA: Diagnosis not present

## 2022-10-03 DIAGNOSIS — S40211D Abrasion of right shoulder, subsequent encounter: Secondary | ICD-10-CM | POA: Diagnosis not present

## 2022-10-04 DIAGNOSIS — M6281 Muscle weakness (generalized): Secondary | ICD-10-CM | POA: Diagnosis not present

## 2022-10-04 DIAGNOSIS — M1712 Unilateral primary osteoarthritis, left knee: Secondary | ICD-10-CM | POA: Diagnosis not present

## 2022-10-05 DIAGNOSIS — L98492 Non-pressure chronic ulcer of skin of other sites with fat layer exposed: Secondary | ICD-10-CM | POA: Diagnosis not present

## 2022-10-05 DIAGNOSIS — M1712 Unilateral primary osteoarthritis, left knee: Secondary | ICD-10-CM | POA: Diagnosis not present

## 2022-10-05 DIAGNOSIS — L98422 Non-pressure chronic ulcer of back with fat layer exposed: Secondary | ICD-10-CM | POA: Diagnosis not present

## 2022-10-05 DIAGNOSIS — L98491 Non-pressure chronic ulcer of skin of other sites limited to breakdown of skin: Secondary | ICD-10-CM | POA: Diagnosis not present

## 2022-10-05 DIAGNOSIS — M6281 Muscle weakness (generalized): Secondary | ICD-10-CM | POA: Diagnosis not present

## 2022-10-06 DIAGNOSIS — M6281 Muscle weakness (generalized): Secondary | ICD-10-CM | POA: Diagnosis not present

## 2022-10-06 DIAGNOSIS — M1712 Unilateral primary osteoarthritis, left knee: Secondary | ICD-10-CM | POA: Diagnosis not present

## 2022-10-09 DIAGNOSIS — M6281 Muscle weakness (generalized): Secondary | ICD-10-CM | POA: Diagnosis not present

## 2022-10-09 DIAGNOSIS — R45851 Suicidal ideations: Secondary | ICD-10-CM | POA: Diagnosis not present

## 2022-10-09 DIAGNOSIS — F5105 Insomnia due to other mental disorder: Secondary | ICD-10-CM | POA: Diagnosis not present

## 2022-10-09 DIAGNOSIS — F332 Major depressive disorder, recurrent severe without psychotic features: Secondary | ICD-10-CM | POA: Diagnosis not present

## 2022-10-09 DIAGNOSIS — M1712 Unilateral primary osteoarthritis, left knee: Secondary | ICD-10-CM | POA: Diagnosis not present

## 2022-10-09 DIAGNOSIS — F419 Anxiety disorder, unspecified: Secondary | ICD-10-CM | POA: Diagnosis not present

## 2022-10-10 DIAGNOSIS — F424 Excoriation (skin-picking) disorder: Secondary | ICD-10-CM | POA: Diagnosis not present

## 2022-10-10 DIAGNOSIS — M6281 Muscle weakness (generalized): Secondary | ICD-10-CM | POA: Diagnosis not present

## 2022-10-10 DIAGNOSIS — E669 Obesity, unspecified: Secondary | ICD-10-CM | POA: Diagnosis not present

## 2022-10-10 DIAGNOSIS — S20411D Abrasion of right back wall of thorax, subsequent encounter: Secondary | ICD-10-CM | POA: Diagnosis not present

## 2022-10-10 DIAGNOSIS — M1712 Unilateral primary osteoarthritis, left knee: Secondary | ICD-10-CM | POA: Diagnosis not present

## 2022-10-10 DIAGNOSIS — S40211D Abrasion of right shoulder, subsequent encounter: Secondary | ICD-10-CM | POA: Diagnosis not present

## 2022-10-11 DIAGNOSIS — M6281 Muscle weakness (generalized): Secondary | ICD-10-CM | POA: Diagnosis not present

## 2022-10-11 DIAGNOSIS — F430 Acute stress reaction: Secondary | ICD-10-CM | POA: Diagnosis not present

## 2022-10-11 DIAGNOSIS — M1712 Unilateral primary osteoarthritis, left knee: Secondary | ICD-10-CM | POA: Diagnosis not present

## 2022-10-11 DIAGNOSIS — F332 Major depressive disorder, recurrent severe without psychotic features: Secondary | ICD-10-CM | POA: Diagnosis not present

## 2022-10-13 DIAGNOSIS — M6281 Muscle weakness (generalized): Secondary | ICD-10-CM | POA: Diagnosis not present

## 2022-10-13 DIAGNOSIS — M1712 Unilateral primary osteoarthritis, left knee: Secondary | ICD-10-CM | POA: Diagnosis not present

## 2022-10-14 DIAGNOSIS — M1712 Unilateral primary osteoarthritis, left knee: Secondary | ICD-10-CM | POA: Diagnosis not present

## 2022-10-14 DIAGNOSIS — M6281 Muscle weakness (generalized): Secondary | ICD-10-CM | POA: Diagnosis not present

## 2022-10-16 DIAGNOSIS — M1712 Unilateral primary osteoarthritis, left knee: Secondary | ICD-10-CM | POA: Diagnosis not present

## 2022-10-16 DIAGNOSIS — M6281 Muscle weakness (generalized): Secondary | ICD-10-CM | POA: Diagnosis not present

## 2022-10-17 DIAGNOSIS — E669 Obesity, unspecified: Secondary | ICD-10-CM | POA: Diagnosis not present

## 2022-10-17 DIAGNOSIS — S40211D Abrasion of right shoulder, subsequent encounter: Secondary | ICD-10-CM | POA: Diagnosis not present

## 2022-10-17 DIAGNOSIS — M1712 Unilateral primary osteoarthritis, left knee: Secondary | ICD-10-CM | POA: Diagnosis not present

## 2022-10-17 DIAGNOSIS — M6281 Muscle weakness (generalized): Secondary | ICD-10-CM | POA: Diagnosis not present

## 2022-10-17 DIAGNOSIS — F424 Excoriation (skin-picking) disorder: Secondary | ICD-10-CM | POA: Diagnosis not present

## 2022-10-17 DIAGNOSIS — S20411D Abrasion of right back wall of thorax, subsequent encounter: Secondary | ICD-10-CM | POA: Diagnosis not present

## 2022-10-18 DIAGNOSIS — M1712 Unilateral primary osteoarthritis, left knee: Secondary | ICD-10-CM | POA: Diagnosis not present

## 2022-10-18 DIAGNOSIS — M6281 Muscle weakness (generalized): Secondary | ICD-10-CM | POA: Diagnosis not present

## 2022-10-19 DIAGNOSIS — D485 Neoplasm of uncertain behavior of skin: Secondary | ICD-10-CM | POA: Diagnosis not present

## 2022-10-19 DIAGNOSIS — C44519 Basal cell carcinoma of skin of other part of trunk: Secondary | ICD-10-CM | POA: Diagnosis not present

## 2022-10-19 DIAGNOSIS — I4891 Unspecified atrial fibrillation: Secondary | ICD-10-CM | POA: Diagnosis not present

## 2022-10-19 DIAGNOSIS — N184 Chronic kidney disease, stage 4 (severe): Secondary | ICD-10-CM | POA: Diagnosis not present

## 2022-10-19 DIAGNOSIS — C4441 Basal cell carcinoma of skin of scalp and neck: Secondary | ICD-10-CM | POA: Diagnosis not present

## 2022-10-19 DIAGNOSIS — I10 Essential (primary) hypertension: Secondary | ICD-10-CM | POA: Diagnosis not present

## 2022-10-20 DIAGNOSIS — M1712 Unilateral primary osteoarthritis, left knee: Secondary | ICD-10-CM | POA: Diagnosis not present

## 2022-10-20 DIAGNOSIS — M6281 Muscle weakness (generalized): Secondary | ICD-10-CM | POA: Diagnosis not present

## 2022-10-24 DIAGNOSIS — F424 Excoriation (skin-picking) disorder: Secondary | ICD-10-CM | POA: Diagnosis not present

## 2022-10-24 DIAGNOSIS — E669 Obesity, unspecified: Secondary | ICD-10-CM | POA: Diagnosis not present

## 2022-10-24 DIAGNOSIS — I10 Essential (primary) hypertension: Secondary | ICD-10-CM | POA: Diagnosis not present

## 2022-10-24 DIAGNOSIS — I482 Chronic atrial fibrillation, unspecified: Secondary | ICD-10-CM | POA: Diagnosis not present

## 2022-10-25 DIAGNOSIS — F430 Acute stress reaction: Secondary | ICD-10-CM | POA: Diagnosis not present

## 2022-10-25 DIAGNOSIS — F332 Major depressive disorder, recurrent severe without psychotic features: Secondary | ICD-10-CM | POA: Diagnosis not present

## 2022-10-25 DIAGNOSIS — F419 Anxiety disorder, unspecified: Secondary | ICD-10-CM | POA: Diagnosis not present

## 2022-10-26 DIAGNOSIS — F419 Anxiety disorder, unspecified: Secondary | ICD-10-CM | POA: Diagnosis not present

## 2022-11-01 DIAGNOSIS — I482 Chronic atrial fibrillation, unspecified: Secondary | ICD-10-CM | POA: Diagnosis not present

## 2022-11-01 DIAGNOSIS — F424 Excoriation (skin-picking) disorder: Secondary | ICD-10-CM | POA: Diagnosis not present

## 2022-11-01 DIAGNOSIS — I10 Essential (primary) hypertension: Secondary | ICD-10-CM | POA: Diagnosis not present

## 2022-11-01 DIAGNOSIS — E669 Obesity, unspecified: Secondary | ICD-10-CM | POA: Diagnosis not present

## 2022-11-01 DIAGNOSIS — M25562 Pain in left knee: Secondary | ICD-10-CM | POA: Diagnosis not present

## 2022-11-02 DIAGNOSIS — S83209A Unspecified tear of unspecified meniscus, current injury, unspecified knee, initial encounter: Secondary | ICD-10-CM | POA: Diagnosis not present

## 2022-11-03 DIAGNOSIS — L98492 Non-pressure chronic ulcer of skin of other sites with fat layer exposed: Secondary | ICD-10-CM | POA: Diagnosis not present

## 2022-11-03 DIAGNOSIS — L98422 Non-pressure chronic ulcer of back with fat layer exposed: Secondary | ICD-10-CM | POA: Diagnosis not present

## 2022-11-03 DIAGNOSIS — L98491 Non-pressure chronic ulcer of skin of other sites limited to breakdown of skin: Secondary | ICD-10-CM | POA: Diagnosis not present

## 2022-11-03 DIAGNOSIS — L98495 Non-pressure chronic ulcer of skin of other sites with muscle involvement without evidence of necrosis: Secondary | ICD-10-CM | POA: Diagnosis not present

## 2022-11-08 DIAGNOSIS — I10 Essential (primary) hypertension: Secondary | ICD-10-CM | POA: Diagnosis not present

## 2022-11-08 DIAGNOSIS — F424 Excoriation (skin-picking) disorder: Secondary | ICD-10-CM | POA: Diagnosis not present

## 2022-11-08 DIAGNOSIS — F430 Acute stress reaction: Secondary | ICD-10-CM | POA: Diagnosis not present

## 2022-11-08 DIAGNOSIS — F332 Major depressive disorder, recurrent severe without psychotic features: Secondary | ICD-10-CM | POA: Diagnosis not present

## 2022-11-08 DIAGNOSIS — E669 Obesity, unspecified: Secondary | ICD-10-CM | POA: Diagnosis not present

## 2022-11-08 DIAGNOSIS — I482 Chronic atrial fibrillation, unspecified: Secondary | ICD-10-CM | POA: Diagnosis not present

## 2022-11-13 DIAGNOSIS — G4700 Insomnia, unspecified: Secondary | ICD-10-CM | POA: Diagnosis not present

## 2022-11-13 DIAGNOSIS — F419 Anxiety disorder, unspecified: Secondary | ICD-10-CM | POA: Diagnosis not present

## 2022-11-13 DIAGNOSIS — F339 Major depressive disorder, recurrent, unspecified: Secondary | ICD-10-CM | POA: Diagnosis not present

## 2022-11-15 DIAGNOSIS — I10 Essential (primary) hypertension: Secondary | ICD-10-CM | POA: Diagnosis not present

## 2022-11-15 DIAGNOSIS — I482 Chronic atrial fibrillation, unspecified: Secondary | ICD-10-CM | POA: Diagnosis not present

## 2022-11-15 DIAGNOSIS — E669 Obesity, unspecified: Secondary | ICD-10-CM | POA: Diagnosis not present

## 2022-11-15 DIAGNOSIS — F424 Excoriation (skin-picking) disorder: Secondary | ICD-10-CM | POA: Diagnosis not present

## 2022-11-22 DIAGNOSIS — E669 Obesity, unspecified: Secondary | ICD-10-CM | POA: Diagnosis not present

## 2022-11-22 DIAGNOSIS — F339 Major depressive disorder, recurrent, unspecified: Secondary | ICD-10-CM | POA: Diagnosis not present

## 2022-11-22 DIAGNOSIS — F424 Excoriation (skin-picking) disorder: Secondary | ICD-10-CM | POA: Diagnosis not present

## 2022-11-22 DIAGNOSIS — I10 Essential (primary) hypertension: Secondary | ICD-10-CM | POA: Diagnosis not present

## 2022-11-22 DIAGNOSIS — C44519 Basal cell carcinoma of skin of other part of trunk: Secondary | ICD-10-CM | POA: Diagnosis not present

## 2022-11-22 DIAGNOSIS — F419 Anxiety disorder, unspecified: Secondary | ICD-10-CM | POA: Diagnosis not present

## 2022-11-27 DIAGNOSIS — F424 Excoriation (skin-picking) disorder: Secondary | ICD-10-CM | POA: Diagnosis not present

## 2022-11-27 DIAGNOSIS — I10 Essential (primary) hypertension: Secondary | ICD-10-CM | POA: Diagnosis not present

## 2022-11-27 DIAGNOSIS — C44519 Basal cell carcinoma of skin of other part of trunk: Secondary | ICD-10-CM | POA: Diagnosis not present

## 2022-11-27 DIAGNOSIS — E669 Obesity, unspecified: Secondary | ICD-10-CM | POA: Diagnosis not present

## 2022-11-30 DIAGNOSIS — C44519 Basal cell carcinoma of skin of other part of trunk: Secondary | ICD-10-CM | POA: Diagnosis not present

## 2022-12-01 DIAGNOSIS — Z9889 Other specified postprocedural states: Secondary | ICD-10-CM | POA: Diagnosis not present

## 2022-12-01 DIAGNOSIS — M11262 Other chondrocalcinosis, left knee: Secondary | ICD-10-CM | POA: Diagnosis not present

## 2022-12-01 DIAGNOSIS — I5022 Chronic systolic (congestive) heart failure: Secondary | ICD-10-CM | POA: Diagnosis not present

## 2022-12-01 DIAGNOSIS — M1712 Unilateral primary osteoarthritis, left knee: Secondary | ICD-10-CM | POA: Diagnosis not present

## 2022-12-06 DIAGNOSIS — L578 Other skin changes due to chronic exposure to nonionizing radiation: Secondary | ICD-10-CM | POA: Diagnosis not present

## 2022-12-06 DIAGNOSIS — L814 Other melanin hyperpigmentation: Secondary | ICD-10-CM | POA: Diagnosis not present

## 2022-12-06 DIAGNOSIS — L988 Other specified disorders of the skin and subcutaneous tissue: Secondary | ICD-10-CM | POA: Diagnosis not present

## 2022-12-06 DIAGNOSIS — L98422 Non-pressure chronic ulcer of back with fat layer exposed: Secondary | ICD-10-CM | POA: Diagnosis not present

## 2022-12-06 DIAGNOSIS — C44212 Basal cell carcinoma of skin of right ear and external auricular canal: Secondary | ICD-10-CM | POA: Diagnosis not present

## 2022-12-06 DIAGNOSIS — G8918 Other acute postprocedural pain: Secondary | ICD-10-CM | POA: Diagnosis not present

## 2022-12-06 DIAGNOSIS — L98491 Non-pressure chronic ulcer of skin of other sites limited to breakdown of skin: Secondary | ICD-10-CM | POA: Diagnosis not present

## 2022-12-06 DIAGNOSIS — L98492 Non-pressure chronic ulcer of skin of other sites with fat layer exposed: Secondary | ICD-10-CM | POA: Diagnosis not present

## 2022-12-08 DIAGNOSIS — R2689 Other abnormalities of gait and mobility: Secondary | ICD-10-CM | POA: Diagnosis not present

## 2022-12-08 DIAGNOSIS — M25562 Pain in left knee: Secondary | ICD-10-CM | POA: Diagnosis not present

## 2022-12-08 DIAGNOSIS — M1712 Unilateral primary osteoarthritis, left knee: Secondary | ICD-10-CM | POA: Diagnosis not present

## 2022-12-08 DIAGNOSIS — M6281 Muscle weakness (generalized): Secondary | ICD-10-CM | POA: Diagnosis not present

## 2022-12-11 DIAGNOSIS — F1011 Alcohol abuse, in remission: Secondary | ICD-10-CM | POA: Diagnosis not present

## 2022-12-11 DIAGNOSIS — M1712 Unilateral primary osteoarthritis, left knee: Secondary | ICD-10-CM | POA: Diagnosis not present

## 2022-12-11 DIAGNOSIS — F331 Major depressive disorder, recurrent, moderate: Secondary | ICD-10-CM | POA: Diagnosis not present

## 2022-12-11 DIAGNOSIS — F419 Anxiety disorder, unspecified: Secondary | ICD-10-CM | POA: Diagnosis not present

## 2022-12-11 DIAGNOSIS — R2689 Other abnormalities of gait and mobility: Secondary | ICD-10-CM | POA: Diagnosis not present

## 2022-12-11 DIAGNOSIS — F5105 Insomnia due to other mental disorder: Secondary | ICD-10-CM | POA: Diagnosis not present

## 2022-12-11 DIAGNOSIS — M6281 Muscle weakness (generalized): Secondary | ICD-10-CM | POA: Diagnosis not present

## 2022-12-12 DIAGNOSIS — F32A Depression, unspecified: Secondary | ICD-10-CM | POA: Diagnosis not present

## 2022-12-12 DIAGNOSIS — E440 Moderate protein-calorie malnutrition: Secondary | ICD-10-CM | POA: Diagnosis not present

## 2022-12-13 DIAGNOSIS — I10 Essential (primary) hypertension: Secondary | ICD-10-CM | POA: Diagnosis not present

## 2022-12-13 DIAGNOSIS — F419 Anxiety disorder, unspecified: Secondary | ICD-10-CM | POA: Diagnosis not present

## 2022-12-13 DIAGNOSIS — C44519 Basal cell carcinoma of skin of other part of trunk: Secondary | ICD-10-CM | POA: Diagnosis not present

## 2022-12-13 DIAGNOSIS — R2689 Other abnormalities of gait and mobility: Secondary | ICD-10-CM | POA: Diagnosis not present

## 2022-12-13 DIAGNOSIS — M6281 Muscle weakness (generalized): Secondary | ICD-10-CM | POA: Diagnosis not present

## 2022-12-13 DIAGNOSIS — M1712 Unilateral primary osteoarthritis, left knee: Secondary | ICD-10-CM | POA: Diagnosis not present

## 2022-12-13 DIAGNOSIS — E669 Obesity, unspecified: Secondary | ICD-10-CM | POA: Diagnosis not present

## 2022-12-13 DIAGNOSIS — F424 Excoriation (skin-picking) disorder: Secondary | ICD-10-CM | POA: Diagnosis not present

## 2022-12-13 DIAGNOSIS — F331 Major depressive disorder, recurrent, moderate: Secondary | ICD-10-CM | POA: Diagnosis not present

## 2022-12-14 DIAGNOSIS — D631 Anemia in chronic kidney disease: Secondary | ICD-10-CM | POA: Diagnosis not present

## 2022-12-14 DIAGNOSIS — M6281 Muscle weakness (generalized): Secondary | ICD-10-CM | POA: Diagnosis not present

## 2022-12-14 DIAGNOSIS — N184 Chronic kidney disease, stage 4 (severe): Secondary | ICD-10-CM | POA: Diagnosis not present

## 2022-12-14 DIAGNOSIS — R2689 Other abnormalities of gait and mobility: Secondary | ICD-10-CM | POA: Diagnosis not present

## 2022-12-14 DIAGNOSIS — N2581 Secondary hyperparathyroidism of renal origin: Secondary | ICD-10-CM | POA: Diagnosis not present

## 2022-12-14 DIAGNOSIS — M1712 Unilateral primary osteoarthritis, left knee: Secondary | ICD-10-CM | POA: Diagnosis not present

## 2022-12-14 DIAGNOSIS — I5022 Chronic systolic (congestive) heart failure: Secondary | ICD-10-CM | POA: Diagnosis not present

## 2022-12-15 DIAGNOSIS — R2689 Other abnormalities of gait and mobility: Secondary | ICD-10-CM | POA: Diagnosis not present

## 2022-12-15 DIAGNOSIS — M6281 Muscle weakness (generalized): Secondary | ICD-10-CM | POA: Diagnosis not present

## 2022-12-15 DIAGNOSIS — M1712 Unilateral primary osteoarthritis, left knee: Secondary | ICD-10-CM | POA: Diagnosis not present

## 2022-12-16 DIAGNOSIS — M6281 Muscle weakness (generalized): Secondary | ICD-10-CM | POA: Diagnosis not present

## 2022-12-16 DIAGNOSIS — M1712 Unilateral primary osteoarthritis, left knee: Secondary | ICD-10-CM | POA: Diagnosis not present

## 2022-12-16 DIAGNOSIS — R2689 Other abnormalities of gait and mobility: Secondary | ICD-10-CM | POA: Diagnosis not present

## 2022-12-18 DIAGNOSIS — C44519 Basal cell carcinoma of skin of other part of trunk: Secondary | ICD-10-CM | POA: Diagnosis not present

## 2022-12-18 DIAGNOSIS — E669 Obesity, unspecified: Secondary | ICD-10-CM | POA: Diagnosis not present

## 2022-12-18 DIAGNOSIS — M6281 Muscle weakness (generalized): Secondary | ICD-10-CM | POA: Diagnosis not present

## 2022-12-18 DIAGNOSIS — L089 Local infection of the skin and subcutaneous tissue, unspecified: Secondary | ICD-10-CM | POA: Diagnosis not present

## 2022-12-18 DIAGNOSIS — M1712 Unilateral primary osteoarthritis, left knee: Secondary | ICD-10-CM | POA: Diagnosis not present

## 2022-12-18 DIAGNOSIS — F424 Excoriation (skin-picking) disorder: Secondary | ICD-10-CM | POA: Diagnosis not present

## 2022-12-18 DIAGNOSIS — R2689 Other abnormalities of gait and mobility: Secondary | ICD-10-CM | POA: Diagnosis not present

## 2022-12-18 DIAGNOSIS — I10 Essential (primary) hypertension: Secondary | ICD-10-CM | POA: Diagnosis not present

## 2022-12-20 DIAGNOSIS — M6281 Muscle weakness (generalized): Secondary | ICD-10-CM | POA: Diagnosis not present

## 2022-12-20 DIAGNOSIS — M1712 Unilateral primary osteoarthritis, left knee: Secondary | ICD-10-CM | POA: Diagnosis not present

## 2022-12-20 DIAGNOSIS — R2689 Other abnormalities of gait and mobility: Secondary | ICD-10-CM | POA: Diagnosis not present

## 2022-12-21 DIAGNOSIS — M6281 Muscle weakness (generalized): Secondary | ICD-10-CM | POA: Diagnosis not present

## 2022-12-21 DIAGNOSIS — M1712 Unilateral primary osteoarthritis, left knee: Secondary | ICD-10-CM | POA: Diagnosis not present

## 2022-12-21 DIAGNOSIS — R2689 Other abnormalities of gait and mobility: Secondary | ICD-10-CM | POA: Diagnosis not present

## 2022-12-22 DIAGNOSIS — M6281 Muscle weakness (generalized): Secondary | ICD-10-CM | POA: Diagnosis not present

## 2022-12-22 DIAGNOSIS — M1712 Unilateral primary osteoarthritis, left knee: Secondary | ICD-10-CM | POA: Diagnosis not present

## 2022-12-22 DIAGNOSIS — R059 Cough, unspecified: Secondary | ICD-10-CM | POA: Diagnosis not present

## 2022-12-22 DIAGNOSIS — R2689 Other abnormalities of gait and mobility: Secondary | ICD-10-CM | POA: Diagnosis not present

## 2022-12-23 DIAGNOSIS — M1712 Unilateral primary osteoarthritis, left knee: Secondary | ICD-10-CM | POA: Diagnosis not present

## 2022-12-23 DIAGNOSIS — M6281 Muscle weakness (generalized): Secondary | ICD-10-CM | POA: Diagnosis not present

## 2022-12-23 DIAGNOSIS — R2689 Other abnormalities of gait and mobility: Secondary | ICD-10-CM | POA: Diagnosis not present

## 2022-12-24 DIAGNOSIS — M6281 Muscle weakness (generalized): Secondary | ICD-10-CM | POA: Diagnosis not present

## 2022-12-24 DIAGNOSIS — R2689 Other abnormalities of gait and mobility: Secondary | ICD-10-CM | POA: Diagnosis not present

## 2022-12-24 DIAGNOSIS — M1712 Unilateral primary osteoarthritis, left knee: Secondary | ICD-10-CM | POA: Diagnosis not present

## 2022-12-25 DIAGNOSIS — M1712 Unilateral primary osteoarthritis, left knee: Secondary | ICD-10-CM | POA: Diagnosis not present

## 2022-12-25 DIAGNOSIS — R2689 Other abnormalities of gait and mobility: Secondary | ICD-10-CM | POA: Diagnosis not present

## 2022-12-25 DIAGNOSIS — M6281 Muscle weakness (generalized): Secondary | ICD-10-CM | POA: Diagnosis not present

## 2022-12-26 DIAGNOSIS — M1712 Unilateral primary osteoarthritis, left knee: Secondary | ICD-10-CM | POA: Diagnosis not present

## 2022-12-26 DIAGNOSIS — M6281 Muscle weakness (generalized): Secondary | ICD-10-CM | POA: Diagnosis not present

## 2022-12-26 DIAGNOSIS — R2689 Other abnormalities of gait and mobility: Secondary | ICD-10-CM | POA: Diagnosis not present

## 2022-12-27 DIAGNOSIS — M6281 Muscle weakness (generalized): Secondary | ICD-10-CM | POA: Diagnosis not present

## 2022-12-27 DIAGNOSIS — M25561 Pain in right knee: Secondary | ICD-10-CM | POA: Diagnosis not present

## 2022-12-27 DIAGNOSIS — I10 Essential (primary) hypertension: Secondary | ICD-10-CM | POA: Diagnosis not present

## 2022-12-27 DIAGNOSIS — F331 Major depressive disorder, recurrent, moderate: Secondary | ICD-10-CM | POA: Diagnosis not present

## 2022-12-27 DIAGNOSIS — F424 Excoriation (skin-picking) disorder: Secondary | ICD-10-CM | POA: Diagnosis not present

## 2022-12-27 DIAGNOSIS — R2689 Other abnormalities of gait and mobility: Secondary | ICD-10-CM | POA: Diagnosis not present

## 2022-12-27 DIAGNOSIS — F419 Anxiety disorder, unspecified: Secondary | ICD-10-CM | POA: Diagnosis not present

## 2022-12-27 DIAGNOSIS — C44519 Basal cell carcinoma of skin of other part of trunk: Secondary | ICD-10-CM | POA: Diagnosis not present

## 2022-12-27 DIAGNOSIS — M1712 Unilateral primary osteoarthritis, left knee: Secondary | ICD-10-CM | POA: Diagnosis not present

## 2022-12-27 DIAGNOSIS — E669 Obesity, unspecified: Secondary | ICD-10-CM | POA: Diagnosis not present

## 2022-12-28 DIAGNOSIS — M6281 Muscle weakness (generalized): Secondary | ICD-10-CM | POA: Diagnosis not present

## 2022-12-28 DIAGNOSIS — M1711 Unilateral primary osteoarthritis, right knee: Secondary | ICD-10-CM | POA: Diagnosis not present

## 2022-12-28 DIAGNOSIS — M1712 Unilateral primary osteoarthritis, left knee: Secondary | ICD-10-CM | POA: Diagnosis not present

## 2022-12-28 DIAGNOSIS — R2689 Other abnormalities of gait and mobility: Secondary | ICD-10-CM | POA: Diagnosis not present

## 2022-12-29 DIAGNOSIS — M25561 Pain in right knee: Secondary | ICD-10-CM | POA: Diagnosis not present

## 2022-12-29 DIAGNOSIS — I739 Peripheral vascular disease, unspecified: Secondary | ICD-10-CM | POA: Diagnosis not present

## 2022-12-29 DIAGNOSIS — M2012 Hallux valgus (acquired), left foot: Secondary | ICD-10-CM | POA: Diagnosis not present

## 2022-12-29 DIAGNOSIS — M2011 Hallux valgus (acquired), right foot: Secondary | ICD-10-CM | POA: Diagnosis not present

## 2022-12-29 DIAGNOSIS — M1712 Unilateral primary osteoarthritis, left knee: Secondary | ICD-10-CM | POA: Diagnosis not present

## 2022-12-29 DIAGNOSIS — M1711 Unilateral primary osteoarthritis, right knee: Secondary | ICD-10-CM | POA: Diagnosis not present

## 2022-12-29 DIAGNOSIS — B351 Tinea unguium: Secondary | ICD-10-CM | POA: Diagnosis not present

## 2022-12-29 DIAGNOSIS — R2689 Other abnormalities of gait and mobility: Secondary | ICD-10-CM | POA: Diagnosis not present

## 2022-12-29 DIAGNOSIS — L603 Nail dystrophy: Secondary | ICD-10-CM | POA: Diagnosis not present

## 2022-12-29 DIAGNOSIS — M6281 Muscle weakness (generalized): Secondary | ICD-10-CM | POA: Diagnosis not present

## 2023-01-01 DIAGNOSIS — M1712 Unilateral primary osteoarthritis, left knee: Secondary | ICD-10-CM | POA: Diagnosis not present

## 2023-01-01 DIAGNOSIS — M6281 Muscle weakness (generalized): Secondary | ICD-10-CM | POA: Diagnosis not present

## 2023-01-01 DIAGNOSIS — R2689 Other abnormalities of gait and mobility: Secondary | ICD-10-CM | POA: Diagnosis not present

## 2023-01-02 DIAGNOSIS — R2689 Other abnormalities of gait and mobility: Secondary | ICD-10-CM | POA: Diagnosis not present

## 2023-01-02 DIAGNOSIS — M1712 Unilateral primary osteoarthritis, left knee: Secondary | ICD-10-CM | POA: Diagnosis not present

## 2023-01-02 DIAGNOSIS — M6281 Muscle weakness (generalized): Secondary | ICD-10-CM | POA: Diagnosis not present

## 2023-01-03 DIAGNOSIS — F1011 Alcohol abuse, in remission: Secondary | ICD-10-CM | POA: Diagnosis not present

## 2023-01-03 DIAGNOSIS — F331 Major depressive disorder, recurrent, moderate: Secondary | ICD-10-CM | POA: Diagnosis not present

## 2023-01-03 DIAGNOSIS — F5105 Insomnia due to other mental disorder: Secondary | ICD-10-CM | POA: Diagnosis not present

## 2023-01-03 DIAGNOSIS — F411 Generalized anxiety disorder: Secondary | ICD-10-CM | POA: Diagnosis not present

## 2023-01-04 DIAGNOSIS — M25562 Pain in left knee: Secondary | ICD-10-CM | POA: Diagnosis not present

## 2023-01-04 DIAGNOSIS — I10 Essential (primary) hypertension: Secondary | ICD-10-CM | POA: Diagnosis not present

## 2023-01-04 DIAGNOSIS — R2689 Other abnormalities of gait and mobility: Secondary | ICD-10-CM | POA: Diagnosis not present

## 2023-01-04 DIAGNOSIS — E669 Obesity, unspecified: Secondary | ICD-10-CM | POA: Diagnosis not present

## 2023-01-04 DIAGNOSIS — M6281 Muscle weakness (generalized): Secondary | ICD-10-CM | POA: Diagnosis not present

## 2023-01-04 DIAGNOSIS — M1712 Unilateral primary osteoarthritis, left knee: Secondary | ICD-10-CM | POA: Diagnosis not present

## 2023-01-04 DIAGNOSIS — F424 Excoriation (skin-picking) disorder: Secondary | ICD-10-CM | POA: Diagnosis not present

## 2023-01-04 DIAGNOSIS — C44519 Basal cell carcinoma of skin of other part of trunk: Secondary | ICD-10-CM | POA: Diagnosis not present

## 2023-01-05 DIAGNOSIS — F32A Depression, unspecified: Secondary | ICD-10-CM | POA: Diagnosis not present

## 2023-01-05 DIAGNOSIS — G4700 Insomnia, unspecified: Secondary | ICD-10-CM | POA: Diagnosis not present

## 2023-01-05 DIAGNOSIS — M1712 Unilateral primary osteoarthritis, left knee: Secondary | ICD-10-CM | POA: Diagnosis not present

## 2023-01-05 DIAGNOSIS — F419 Anxiety disorder, unspecified: Secondary | ICD-10-CM | POA: Diagnosis not present

## 2023-01-05 DIAGNOSIS — R2689 Other abnormalities of gait and mobility: Secondary | ICD-10-CM | POA: Diagnosis not present

## 2023-01-05 DIAGNOSIS — M6281 Muscle weakness (generalized): Secondary | ICD-10-CM | POA: Diagnosis not present

## 2023-01-06 DIAGNOSIS — R2689 Other abnormalities of gait and mobility: Secondary | ICD-10-CM | POA: Diagnosis not present

## 2023-01-06 DIAGNOSIS — M1712 Unilateral primary osteoarthritis, left knee: Secondary | ICD-10-CM | POA: Diagnosis not present

## 2023-01-06 DIAGNOSIS — M6281 Muscle weakness (generalized): Secondary | ICD-10-CM | POA: Diagnosis not present

## 2023-01-08 DIAGNOSIS — M1712 Unilateral primary osteoarthritis, left knee: Secondary | ICD-10-CM | POA: Diagnosis not present

## 2023-01-08 DIAGNOSIS — M6281 Muscle weakness (generalized): Secondary | ICD-10-CM | POA: Diagnosis not present

## 2023-01-08 DIAGNOSIS — R2689 Other abnormalities of gait and mobility: Secondary | ICD-10-CM | POA: Diagnosis not present

## 2023-01-09 DIAGNOSIS — M6281 Muscle weakness (generalized): Secondary | ICD-10-CM | POA: Diagnosis not present

## 2023-01-09 DIAGNOSIS — R2689 Other abnormalities of gait and mobility: Secondary | ICD-10-CM | POA: Diagnosis not present

## 2023-01-09 DIAGNOSIS — M1712 Unilateral primary osteoarthritis, left knee: Secondary | ICD-10-CM | POA: Diagnosis not present

## 2023-01-10 DIAGNOSIS — F331 Major depressive disorder, recurrent, moderate: Secondary | ICD-10-CM | POA: Diagnosis not present

## 2023-01-10 DIAGNOSIS — F419 Anxiety disorder, unspecified: Secondary | ICD-10-CM | POA: Diagnosis not present

## 2023-01-11 DIAGNOSIS — M25562 Pain in left knee: Secondary | ICD-10-CM | POA: Diagnosis not present

## 2023-01-11 DIAGNOSIS — F424 Excoriation (skin-picking) disorder: Secondary | ICD-10-CM | POA: Diagnosis not present

## 2023-01-11 DIAGNOSIS — I10 Essential (primary) hypertension: Secondary | ICD-10-CM | POA: Diagnosis not present

## 2023-01-11 DIAGNOSIS — E669 Obesity, unspecified: Secondary | ICD-10-CM | POA: Diagnosis not present

## 2023-01-11 DIAGNOSIS — C44519 Basal cell carcinoma of skin of other part of trunk: Secondary | ICD-10-CM | POA: Diagnosis not present

## 2023-01-17 DIAGNOSIS — M6281 Muscle weakness (generalized): Secondary | ICD-10-CM | POA: Diagnosis not present

## 2023-01-17 DIAGNOSIS — F0390 Unspecified dementia without behavioral disturbance: Secondary | ICD-10-CM | POA: Diagnosis not present

## 2023-01-17 DIAGNOSIS — J449 Chronic obstructive pulmonary disease, unspecified: Secondary | ICD-10-CM | POA: Diagnosis not present

## 2023-01-17 DIAGNOSIS — M1712 Unilateral primary osteoarthritis, left knee: Secondary | ICD-10-CM | POA: Diagnosis not present

## 2023-01-18 DIAGNOSIS — E669 Obesity, unspecified: Secondary | ICD-10-CM | POA: Diagnosis not present

## 2023-01-18 DIAGNOSIS — C44519 Basal cell carcinoma of skin of other part of trunk: Secondary | ICD-10-CM | POA: Diagnosis not present

## 2023-01-18 DIAGNOSIS — F424 Excoriation (skin-picking) disorder: Secondary | ICD-10-CM | POA: Diagnosis not present

## 2023-01-18 DIAGNOSIS — I10 Essential (primary) hypertension: Secondary | ICD-10-CM | POA: Diagnosis not present

## 2023-01-19 DIAGNOSIS — L98422 Non-pressure chronic ulcer of back with fat layer exposed: Secondary | ICD-10-CM | POA: Diagnosis not present

## 2023-01-19 DIAGNOSIS — L98491 Non-pressure chronic ulcer of skin of other sites limited to breakdown of skin: Secondary | ICD-10-CM | POA: Diagnosis not present

## 2023-01-19 DIAGNOSIS — L98492 Non-pressure chronic ulcer of skin of other sites with fat layer exposed: Secondary | ICD-10-CM | POA: Diagnosis not present

## 2023-01-24 DIAGNOSIS — F419 Anxiety disorder, unspecified: Secondary | ICD-10-CM | POA: Diagnosis not present

## 2023-01-24 DIAGNOSIS — F331 Major depressive disorder, recurrent, moderate: Secondary | ICD-10-CM | POA: Diagnosis not present

## 2023-01-25 DIAGNOSIS — F424 Excoriation (skin-picking) disorder: Secondary | ICD-10-CM | POA: Diagnosis not present

## 2023-01-25 DIAGNOSIS — C44519 Basal cell carcinoma of skin of other part of trunk: Secondary | ICD-10-CM | POA: Diagnosis not present

## 2023-01-25 DIAGNOSIS — E669 Obesity, unspecified: Secondary | ICD-10-CM | POA: Diagnosis not present

## 2023-01-25 DIAGNOSIS — I10 Essential (primary) hypertension: Secondary | ICD-10-CM | POA: Diagnosis not present

## 2023-01-29 DIAGNOSIS — S0100XA Unspecified open wound of scalp, initial encounter: Secondary | ICD-10-CM | POA: Diagnosis not present

## 2023-01-31 DIAGNOSIS — M25562 Pain in left knee: Secondary | ICD-10-CM | POA: Diagnosis not present

## 2023-01-31 DIAGNOSIS — M1711 Unilateral primary osteoarthritis, right knee: Secondary | ICD-10-CM | POA: Diagnosis not present

## 2023-02-01 DIAGNOSIS — F424 Excoriation (skin-picking) disorder: Secondary | ICD-10-CM | POA: Diagnosis not present

## 2023-02-01 DIAGNOSIS — C44519 Basal cell carcinoma of skin of other part of trunk: Secondary | ICD-10-CM | POA: Diagnosis not present

## 2023-02-01 DIAGNOSIS — I10 Essential (primary) hypertension: Secondary | ICD-10-CM | POA: Diagnosis not present

## 2023-02-01 DIAGNOSIS — E669 Obesity, unspecified: Secondary | ICD-10-CM | POA: Diagnosis not present

## 2023-02-05 DIAGNOSIS — G8929 Other chronic pain: Secondary | ICD-10-CM | POA: Diagnosis not present

## 2023-02-05 DIAGNOSIS — F1011 Alcohol abuse, in remission: Secondary | ICD-10-CM | POA: Diagnosis not present

## 2023-02-05 DIAGNOSIS — F331 Major depressive disorder, recurrent, moderate: Secondary | ICD-10-CM | POA: Diagnosis not present

## 2023-02-05 DIAGNOSIS — G4700 Insomnia, unspecified: Secondary | ICD-10-CM | POA: Diagnosis not present

## 2023-02-05 DIAGNOSIS — F419 Anxiety disorder, unspecified: Secondary | ICD-10-CM | POA: Diagnosis not present

## 2023-02-07 DIAGNOSIS — F331 Major depressive disorder, recurrent, moderate: Secondary | ICD-10-CM | POA: Diagnosis not present

## 2023-02-07 DIAGNOSIS — F419 Anxiety disorder, unspecified: Secondary | ICD-10-CM | POA: Diagnosis not present

## 2023-02-14 DIAGNOSIS — F331 Major depressive disorder, recurrent, moderate: Secondary | ICD-10-CM | POA: Diagnosis not present

## 2023-02-14 DIAGNOSIS — F419 Anxiety disorder, unspecified: Secondary | ICD-10-CM | POA: Diagnosis not present

## 2023-02-15 DIAGNOSIS — E669 Obesity, unspecified: Secondary | ICD-10-CM | POA: Diagnosis not present

## 2023-02-15 DIAGNOSIS — C44519 Basal cell carcinoma of skin of other part of trunk: Secondary | ICD-10-CM | POA: Diagnosis not present

## 2023-02-15 DIAGNOSIS — I10 Essential (primary) hypertension: Secondary | ICD-10-CM | POA: Diagnosis not present

## 2023-02-15 DIAGNOSIS — F424 Excoriation (skin-picking) disorder: Secondary | ICD-10-CM | POA: Diagnosis not present

## 2023-02-19 DIAGNOSIS — F411 Generalized anxiety disorder: Secondary | ICD-10-CM | POA: Diagnosis not present

## 2023-02-19 DIAGNOSIS — G8929 Other chronic pain: Secondary | ICD-10-CM | POA: Diagnosis not present

## 2023-02-19 DIAGNOSIS — F1011 Alcohol abuse, in remission: Secondary | ICD-10-CM | POA: Diagnosis not present

## 2023-02-19 DIAGNOSIS — F331 Major depressive disorder, recurrent, moderate: Secondary | ICD-10-CM | POA: Diagnosis not present

## 2023-02-19 DIAGNOSIS — F5105 Insomnia due to other mental disorder: Secondary | ICD-10-CM | POA: Diagnosis not present

## 2023-02-23 DIAGNOSIS — F32A Depression, unspecified: Secondary | ICD-10-CM | POA: Diagnosis not present

## 2023-02-23 DIAGNOSIS — I4891 Unspecified atrial fibrillation: Secondary | ICD-10-CM | POA: Diagnosis not present

## 2023-02-23 DIAGNOSIS — F411 Generalized anxiety disorder: Secondary | ICD-10-CM | POA: Diagnosis not present

## 2023-02-23 DIAGNOSIS — N184 Chronic kidney disease, stage 4 (severe): Secondary | ICD-10-CM | POA: Diagnosis not present

## 2023-02-28 DIAGNOSIS — F430 Acute stress reaction: Secondary | ICD-10-CM | POA: Diagnosis not present

## 2023-02-28 DIAGNOSIS — F331 Major depressive disorder, recurrent, moderate: Secondary | ICD-10-CM | POA: Diagnosis not present

## 2023-03-05 DIAGNOSIS — F1011 Alcohol abuse, in remission: Secondary | ICD-10-CM | POA: Diagnosis not present

## 2023-03-05 DIAGNOSIS — G3184 Mild cognitive impairment, so stated: Secondary | ICD-10-CM | POA: Diagnosis not present

## 2023-03-05 DIAGNOSIS — F411 Generalized anxiety disorder: Secondary | ICD-10-CM | POA: Diagnosis not present

## 2023-03-05 DIAGNOSIS — F331 Major depressive disorder, recurrent, moderate: Secondary | ICD-10-CM | POA: Diagnosis not present

## 2023-03-05 DIAGNOSIS — F5105 Insomnia due to other mental disorder: Secondary | ICD-10-CM | POA: Diagnosis not present

## 2023-03-05 DIAGNOSIS — E119 Type 2 diabetes mellitus without complications: Secondary | ICD-10-CM | POA: Diagnosis not present

## 2023-03-06 DIAGNOSIS — E119 Type 2 diabetes mellitus without complications: Secondary | ICD-10-CM | POA: Diagnosis not present

## 2023-03-06 DIAGNOSIS — Z0189 Encounter for other specified special examinations: Secondary | ICD-10-CM | POA: Diagnosis not present

## 2023-03-06 DIAGNOSIS — D509 Iron deficiency anemia, unspecified: Secondary | ICD-10-CM | POA: Diagnosis not present

## 2023-03-07 DIAGNOSIS — D509 Iron deficiency anemia, unspecified: Secondary | ICD-10-CM | POA: Diagnosis not present

## 2023-03-07 DIAGNOSIS — E039 Hypothyroidism, unspecified: Secondary | ICD-10-CM | POA: Diagnosis not present

## 2023-03-07 DIAGNOSIS — Z0189 Encounter for other specified special examinations: Secondary | ICD-10-CM | POA: Diagnosis not present

## 2023-03-14 DIAGNOSIS — F411 Generalized anxiety disorder: Secondary | ICD-10-CM | POA: Diagnosis not present

## 2023-03-14 DIAGNOSIS — F331 Major depressive disorder, recurrent, moderate: Secondary | ICD-10-CM | POA: Diagnosis not present

## 2023-03-19 DIAGNOSIS — F411 Generalized anxiety disorder: Secondary | ICD-10-CM | POA: Diagnosis not present

## 2023-03-19 DIAGNOSIS — F1011 Alcohol abuse, in remission: Secondary | ICD-10-CM | POA: Diagnosis not present

## 2023-03-19 DIAGNOSIS — F5105 Insomnia due to other mental disorder: Secondary | ICD-10-CM | POA: Diagnosis not present

## 2023-03-19 DIAGNOSIS — G3184 Mild cognitive impairment, so stated: Secondary | ICD-10-CM | POA: Diagnosis not present

## 2023-03-19 DIAGNOSIS — F331 Major depressive disorder, recurrent, moderate: Secondary | ICD-10-CM | POA: Diagnosis not present

## 2023-03-20 DIAGNOSIS — N184 Chronic kidney disease, stage 4 (severe): Secondary | ICD-10-CM | POA: Diagnosis not present

## 2023-03-20 DIAGNOSIS — N2581 Secondary hyperparathyroidism of renal origin: Secondary | ICD-10-CM | POA: Diagnosis not present

## 2023-03-20 DIAGNOSIS — D631 Anemia in chronic kidney disease: Secondary | ICD-10-CM | POA: Diagnosis not present

## 2023-03-20 DIAGNOSIS — I5022 Chronic systolic (congestive) heart failure: Secondary | ICD-10-CM | POA: Diagnosis not present

## 2023-03-21 DIAGNOSIS — Z720 Tobacco use: Secondary | ICD-10-CM | POA: Diagnosis not present

## 2023-03-30 DIAGNOSIS — M6281 Muscle weakness (generalized): Secondary | ICD-10-CM | POA: Diagnosis not present

## 2023-03-30 DIAGNOSIS — F0390 Unspecified dementia without behavioral disturbance: Secondary | ICD-10-CM | POA: Diagnosis not present

## 2023-03-30 DIAGNOSIS — J449 Chronic obstructive pulmonary disease, unspecified: Secondary | ICD-10-CM | POA: Diagnosis not present

## 2023-03-30 DIAGNOSIS — M1712 Unilateral primary osteoarthritis, left knee: Secondary | ICD-10-CM | POA: Diagnosis not present

## 2023-04-02 DIAGNOSIS — F411 Generalized anxiety disorder: Secondary | ICD-10-CM | POA: Diagnosis not present

## 2023-04-02 DIAGNOSIS — F331 Major depressive disorder, recurrent, moderate: Secondary | ICD-10-CM | POA: Diagnosis not present

## 2023-04-02 DIAGNOSIS — F1011 Alcohol abuse, in remission: Secondary | ICD-10-CM | POA: Diagnosis not present

## 2023-04-02 DIAGNOSIS — M1712 Unilateral primary osteoarthritis, left knee: Secondary | ICD-10-CM | POA: Diagnosis not present

## 2023-04-02 DIAGNOSIS — M6281 Muscle weakness (generalized): Secondary | ICD-10-CM | POA: Diagnosis not present

## 2023-04-02 DIAGNOSIS — G3184 Mild cognitive impairment, so stated: Secondary | ICD-10-CM | POA: Diagnosis not present

## 2023-04-02 DIAGNOSIS — F5105 Insomnia due to other mental disorder: Secondary | ICD-10-CM | POA: Diagnosis not present

## 2023-04-03 DIAGNOSIS — M6281 Muscle weakness (generalized): Secondary | ICD-10-CM | POA: Diagnosis not present

## 2023-04-03 DIAGNOSIS — R110 Nausea: Secondary | ICD-10-CM | POA: Diagnosis not present

## 2023-04-03 DIAGNOSIS — M1712 Unilateral primary osteoarthritis, left knee: Secondary | ICD-10-CM | POA: Diagnosis not present

## 2023-04-04 DIAGNOSIS — M1712 Unilateral primary osteoarthritis, left knee: Secondary | ICD-10-CM | POA: Diagnosis not present

## 2023-04-04 DIAGNOSIS — M6281 Muscle weakness (generalized): Secondary | ICD-10-CM | POA: Diagnosis not present

## 2023-04-04 DIAGNOSIS — F331 Major depressive disorder, recurrent, moderate: Secondary | ICD-10-CM | POA: Diagnosis not present

## 2023-04-04 DIAGNOSIS — F411 Generalized anxiety disorder: Secondary | ICD-10-CM | POA: Diagnosis not present

## 2023-04-05 DIAGNOSIS — M6281 Muscle weakness (generalized): Secondary | ICD-10-CM | POA: Diagnosis not present

## 2023-04-05 DIAGNOSIS — M1712 Unilateral primary osteoarthritis, left knee: Secondary | ICD-10-CM | POA: Diagnosis not present

## 2023-04-06 DIAGNOSIS — M1712 Unilateral primary osteoarthritis, left knee: Secondary | ICD-10-CM | POA: Diagnosis not present

## 2023-04-06 DIAGNOSIS — M6281 Muscle weakness (generalized): Secondary | ICD-10-CM | POA: Diagnosis not present

## 2023-04-08 DIAGNOSIS — M6281 Muscle weakness (generalized): Secondary | ICD-10-CM | POA: Diagnosis not present

## 2023-04-08 DIAGNOSIS — M1712 Unilateral primary osteoarthritis, left knee: Secondary | ICD-10-CM | POA: Diagnosis not present

## 2023-04-09 DIAGNOSIS — J449 Chronic obstructive pulmonary disease, unspecified: Secondary | ICD-10-CM | POA: Diagnosis not present

## 2023-04-09 DIAGNOSIS — F0390 Unspecified dementia without behavioral disturbance: Secondary | ICD-10-CM | POA: Diagnosis not present

## 2023-04-09 DIAGNOSIS — M1712 Unilateral primary osteoarthritis, left knee: Secondary | ICD-10-CM | POA: Diagnosis not present

## 2023-04-09 DIAGNOSIS — M6281 Muscle weakness (generalized): Secondary | ICD-10-CM | POA: Diagnosis not present

## 2023-04-10 DIAGNOSIS — M1712 Unilateral primary osteoarthritis, left knee: Secondary | ICD-10-CM | POA: Diagnosis not present

## 2023-04-10 DIAGNOSIS — M6281 Muscle weakness (generalized): Secondary | ICD-10-CM | POA: Diagnosis not present

## 2023-04-11 DIAGNOSIS — M1712 Unilateral primary osteoarthritis, left knee: Secondary | ICD-10-CM | POA: Diagnosis not present

## 2023-04-11 DIAGNOSIS — M6281 Muscle weakness (generalized): Secondary | ICD-10-CM | POA: Diagnosis not present

## 2023-04-12 DIAGNOSIS — M6281 Muscle weakness (generalized): Secondary | ICD-10-CM | POA: Diagnosis not present

## 2023-04-12 DIAGNOSIS — M1712 Unilateral primary osteoarthritis, left knee: Secondary | ICD-10-CM | POA: Diagnosis not present

## 2023-04-13 DIAGNOSIS — M6281 Muscle weakness (generalized): Secondary | ICD-10-CM | POA: Diagnosis not present

## 2023-04-13 DIAGNOSIS — M1712 Unilateral primary osteoarthritis, left knee: Secondary | ICD-10-CM | POA: Diagnosis not present

## 2023-04-13 DIAGNOSIS — J449 Chronic obstructive pulmonary disease, unspecified: Secondary | ICD-10-CM | POA: Diagnosis not present

## 2023-04-13 DIAGNOSIS — F0390 Unspecified dementia without behavioral disturbance: Secondary | ICD-10-CM | POA: Diagnosis not present

## 2023-04-16 DIAGNOSIS — M1712 Unilateral primary osteoarthritis, left knee: Secondary | ICD-10-CM | POA: Diagnosis not present

## 2023-04-16 DIAGNOSIS — M6281 Muscle weakness (generalized): Secondary | ICD-10-CM | POA: Diagnosis not present

## 2023-04-16 DIAGNOSIS — D638 Anemia in other chronic diseases classified elsewhere: Secondary | ICD-10-CM | POA: Diagnosis not present

## 2023-04-16 DIAGNOSIS — I10 Essential (primary) hypertension: Secondary | ICD-10-CM | POA: Diagnosis not present

## 2023-04-17 DIAGNOSIS — H524 Presbyopia: Secondary | ICD-10-CM | POA: Diagnosis not present

## 2023-04-17 DIAGNOSIS — M6281 Muscle weakness (generalized): Secondary | ICD-10-CM | POA: Diagnosis not present

## 2023-04-17 DIAGNOSIS — M1712 Unilateral primary osteoarthritis, left knee: Secondary | ICD-10-CM | POA: Diagnosis not present

## 2023-04-17 DIAGNOSIS — H2513 Age-related nuclear cataract, bilateral: Secondary | ICD-10-CM | POA: Diagnosis not present

## 2023-04-17 DIAGNOSIS — H35422 Microcystoid degeneration of retina, left eye: Secondary | ICD-10-CM | POA: Diagnosis not present

## 2023-04-18 DIAGNOSIS — F430 Acute stress reaction: Secondary | ICD-10-CM | POA: Diagnosis not present

## 2023-04-18 DIAGNOSIS — J449 Chronic obstructive pulmonary disease, unspecified: Secondary | ICD-10-CM | POA: Diagnosis not present

## 2023-04-18 DIAGNOSIS — M6281 Muscle weakness (generalized): Secondary | ICD-10-CM | POA: Diagnosis not present

## 2023-04-18 DIAGNOSIS — F331 Major depressive disorder, recurrent, moderate: Secondary | ICD-10-CM | POA: Diagnosis not present

## 2023-04-18 DIAGNOSIS — M1712 Unilateral primary osteoarthritis, left knee: Secondary | ICD-10-CM | POA: Diagnosis not present

## 2023-04-18 DIAGNOSIS — F411 Generalized anxiety disorder: Secondary | ICD-10-CM | POA: Diagnosis not present

## 2023-04-18 DIAGNOSIS — F0390 Unspecified dementia without behavioral disturbance: Secondary | ICD-10-CM | POA: Diagnosis not present

## 2023-04-19 DIAGNOSIS — M1712 Unilateral primary osteoarthritis, left knee: Secondary | ICD-10-CM | POA: Diagnosis not present

## 2023-04-19 DIAGNOSIS — M6281 Muscle weakness (generalized): Secondary | ICD-10-CM | POA: Diagnosis not present

## 2023-04-20 DIAGNOSIS — M1712 Unilateral primary osteoarthritis, left knee: Secondary | ICD-10-CM | POA: Diagnosis not present

## 2023-04-20 DIAGNOSIS — M6281 Muscle weakness (generalized): Secondary | ICD-10-CM | POA: Diagnosis not present

## 2023-04-20 DIAGNOSIS — L602 Onychogryphosis: Secondary | ICD-10-CM | POA: Diagnosis not present

## 2023-04-20 DIAGNOSIS — L603 Nail dystrophy: Secondary | ICD-10-CM | POA: Diagnosis not present

## 2023-04-20 DIAGNOSIS — I739 Peripheral vascular disease, unspecified: Secondary | ICD-10-CM | POA: Diagnosis not present

## 2023-04-23 DIAGNOSIS — M6281 Muscle weakness (generalized): Secondary | ICD-10-CM | POA: Diagnosis not present

## 2023-04-23 DIAGNOSIS — M1712 Unilateral primary osteoarthritis, left knee: Secondary | ICD-10-CM | POA: Diagnosis not present

## 2023-04-24 DIAGNOSIS — M1712 Unilateral primary osteoarthritis, left knee: Secondary | ICD-10-CM | POA: Diagnosis not present

## 2023-04-24 DIAGNOSIS — M6281 Muscle weakness (generalized): Secondary | ICD-10-CM | POA: Diagnosis not present

## 2023-04-25 DIAGNOSIS — N184 Chronic kidney disease, stage 4 (severe): Secondary | ICD-10-CM | POA: Diagnosis not present

## 2023-04-25 DIAGNOSIS — F411 Generalized anxiety disorder: Secondary | ICD-10-CM | POA: Diagnosis not present

## 2023-04-25 DIAGNOSIS — M1712 Unilateral primary osteoarthritis, left knee: Secondary | ICD-10-CM | POA: Diagnosis not present

## 2023-04-25 DIAGNOSIS — F32A Depression, unspecified: Secondary | ICD-10-CM | POA: Diagnosis not present

## 2023-04-25 DIAGNOSIS — M6281 Muscle weakness (generalized): Secondary | ICD-10-CM | POA: Diagnosis not present

## 2023-04-25 DIAGNOSIS — I4891 Unspecified atrial fibrillation: Secondary | ICD-10-CM | POA: Diagnosis not present

## 2023-04-26 DIAGNOSIS — M1712 Unilateral primary osteoarthritis, left knee: Secondary | ICD-10-CM | POA: Diagnosis not present

## 2023-04-26 DIAGNOSIS — M6281 Muscle weakness (generalized): Secondary | ICD-10-CM | POA: Diagnosis not present

## 2023-04-30 DIAGNOSIS — G3184 Mild cognitive impairment, so stated: Secondary | ICD-10-CM | POA: Diagnosis not present

## 2023-04-30 DIAGNOSIS — F1011 Alcohol abuse, in remission: Secondary | ICD-10-CM | POA: Diagnosis not present

## 2023-04-30 DIAGNOSIS — F331 Major depressive disorder, recurrent, moderate: Secondary | ICD-10-CM | POA: Diagnosis not present

## 2023-04-30 DIAGNOSIS — F411 Generalized anxiety disorder: Secondary | ICD-10-CM | POA: Diagnosis not present

## 2023-04-30 DIAGNOSIS — F5105 Insomnia due to other mental disorder: Secondary | ICD-10-CM | POA: Diagnosis not present

## 2023-05-03 DIAGNOSIS — I10 Essential (primary) hypertension: Secondary | ICD-10-CM | POA: Diagnosis not present

## 2023-05-04 DIAGNOSIS — D649 Anemia, unspecified: Secondary | ICD-10-CM | POA: Diagnosis not present

## 2023-05-04 DIAGNOSIS — C44519 Basal cell carcinoma of skin of other part of trunk: Secondary | ICD-10-CM | POA: Diagnosis not present

## 2023-05-04 DIAGNOSIS — F424 Excoriation (skin-picking) disorder: Secondary | ICD-10-CM | POA: Diagnosis not present

## 2023-05-04 DIAGNOSIS — I10 Essential (primary) hypertension: Secondary | ICD-10-CM | POA: Diagnosis not present

## 2023-05-04 DIAGNOSIS — E669 Obesity, unspecified: Secondary | ICD-10-CM | POA: Diagnosis not present

## 2023-05-07 DIAGNOSIS — N184 Chronic kidney disease, stage 4 (severe): Secondary | ICD-10-CM | POA: Diagnosis not present

## 2023-05-07 DIAGNOSIS — F411 Generalized anxiety disorder: Secondary | ICD-10-CM | POA: Diagnosis not present

## 2023-05-07 DIAGNOSIS — F32A Depression, unspecified: Secondary | ICD-10-CM | POA: Diagnosis not present

## 2023-05-07 DIAGNOSIS — I4891 Unspecified atrial fibrillation: Secondary | ICD-10-CM | POA: Diagnosis not present

## 2023-05-07 DIAGNOSIS — Z0189 Encounter for other specified special examinations: Secondary | ICD-10-CM | POA: Diagnosis not present

## 2023-05-08 DIAGNOSIS — J449 Chronic obstructive pulmonary disease, unspecified: Secondary | ICD-10-CM | POA: Diagnosis not present

## 2023-05-08 DIAGNOSIS — F0390 Unspecified dementia without behavioral disturbance: Secondary | ICD-10-CM | POA: Diagnosis not present

## 2023-05-08 DIAGNOSIS — M6281 Muscle weakness (generalized): Secondary | ICD-10-CM | POA: Diagnosis not present

## 2023-05-08 DIAGNOSIS — M1712 Unilateral primary osteoarthritis, left knee: Secondary | ICD-10-CM | POA: Diagnosis not present

## 2023-05-14 DIAGNOSIS — F0390 Unspecified dementia without behavioral disturbance: Secondary | ICD-10-CM | POA: Diagnosis not present

## 2023-05-14 DIAGNOSIS — M6281 Muscle weakness (generalized): Secondary | ICD-10-CM | POA: Diagnosis not present

## 2023-05-14 DIAGNOSIS — J449 Chronic obstructive pulmonary disease, unspecified: Secondary | ICD-10-CM | POA: Diagnosis not present

## 2023-05-14 DIAGNOSIS — M1712 Unilateral primary osteoarthritis, left knee: Secondary | ICD-10-CM | POA: Diagnosis not present

## 2023-05-16 DIAGNOSIS — F331 Major depressive disorder, recurrent, moderate: Secondary | ICD-10-CM | POA: Diagnosis not present

## 2023-05-21 DIAGNOSIS — F1011 Alcohol abuse, in remission: Secondary | ICD-10-CM | POA: Diagnosis not present

## 2023-05-21 DIAGNOSIS — G3184 Mild cognitive impairment, so stated: Secondary | ICD-10-CM | POA: Diagnosis not present

## 2023-05-21 DIAGNOSIS — F331 Major depressive disorder, recurrent, moderate: Secondary | ICD-10-CM | POA: Diagnosis not present

## 2023-05-21 DIAGNOSIS — F5105 Insomnia due to other mental disorder: Secondary | ICD-10-CM | POA: Diagnosis not present

## 2023-05-21 DIAGNOSIS — F411 Generalized anxiety disorder: Secondary | ICD-10-CM | POA: Diagnosis not present

## 2023-05-23 DIAGNOSIS — I5022 Chronic systolic (congestive) heart failure: Secondary | ICD-10-CM | POA: Diagnosis not present

## 2023-05-23 DIAGNOSIS — D631 Anemia in chronic kidney disease: Secondary | ICD-10-CM | POA: Diagnosis not present

## 2023-05-23 DIAGNOSIS — N1831 Chronic kidney disease, stage 3a: Secondary | ICD-10-CM | POA: Diagnosis not present

## 2023-05-23 DIAGNOSIS — I10 Essential (primary) hypertension: Secondary | ICD-10-CM | POA: Diagnosis not present

## 2023-05-23 DIAGNOSIS — N2581 Secondary hyperparathyroidism of renal origin: Secondary | ICD-10-CM | POA: Diagnosis not present

## 2023-05-23 DIAGNOSIS — N1832 Chronic kidney disease, stage 3b: Secondary | ICD-10-CM | POA: Diagnosis not present

## 2023-06-06 DIAGNOSIS — F331 Major depressive disorder, recurrent, moderate: Secondary | ICD-10-CM | POA: Diagnosis not present

## 2023-06-14 DIAGNOSIS — F3341 Major depressive disorder, recurrent, in partial remission: Secondary | ICD-10-CM | POA: Diagnosis not present

## 2023-06-14 DIAGNOSIS — F430 Acute stress reaction: Secondary | ICD-10-CM | POA: Diagnosis not present

## 2023-06-20 DIAGNOSIS — F331 Major depressive disorder, recurrent, moderate: Secondary | ICD-10-CM | POA: Diagnosis not present

## 2023-06-20 DIAGNOSIS — F430 Acute stress reaction: Secondary | ICD-10-CM | POA: Diagnosis not present

## 2023-06-27 DIAGNOSIS — F5105 Insomnia due to other mental disorder: Secondary | ICD-10-CM | POA: Diagnosis not present

## 2023-06-27 DIAGNOSIS — F411 Generalized anxiety disorder: Secondary | ICD-10-CM | POA: Diagnosis not present

## 2023-06-27 DIAGNOSIS — F1011 Alcohol abuse, in remission: Secondary | ICD-10-CM | POA: Diagnosis not present

## 2023-06-27 DIAGNOSIS — G3184 Mild cognitive impairment, so stated: Secondary | ICD-10-CM | POA: Diagnosis not present

## 2023-06-27 DIAGNOSIS — F331 Major depressive disorder, recurrent, moderate: Secondary | ICD-10-CM | POA: Diagnosis not present

## 2023-06-27 DIAGNOSIS — F430 Acute stress reaction: Secondary | ICD-10-CM | POA: Diagnosis not present

## 2023-06-28 ENCOUNTER — Ambulatory Visit: Payer: Medicare HMO | Admitting: Dermatology

## 2023-07-05 DIAGNOSIS — F3341 Major depressive disorder, recurrent, in partial remission: Secondary | ICD-10-CM | POA: Diagnosis not present

## 2023-07-05 DIAGNOSIS — F430 Acute stress reaction: Secondary | ICD-10-CM | POA: Diagnosis not present

## 2023-07-11 DIAGNOSIS — F430 Acute stress reaction: Secondary | ICD-10-CM | POA: Diagnosis not present

## 2023-07-11 DIAGNOSIS — F331 Major depressive disorder, recurrent, moderate: Secondary | ICD-10-CM | POA: Diagnosis not present

## 2023-07-17 DIAGNOSIS — C44212 Basal cell carcinoma of skin of right ear and external auricular canal: Secondary | ICD-10-CM | POA: Diagnosis not present

## 2023-07-18 DIAGNOSIS — N184 Chronic kidney disease, stage 4 (severe): Secondary | ICD-10-CM | POA: Diagnosis not present

## 2023-07-18 DIAGNOSIS — I5022 Chronic systolic (congestive) heart failure: Secondary | ICD-10-CM | POA: Diagnosis not present

## 2023-07-18 DIAGNOSIS — H9201 Otalgia, right ear: Secondary | ICD-10-CM | POA: Diagnosis not present

## 2023-07-19 DIAGNOSIS — N184 Chronic kidney disease, stage 4 (severe): Secondary | ICD-10-CM | POA: Diagnosis not present

## 2023-07-19 DIAGNOSIS — H9201 Otalgia, right ear: Secondary | ICD-10-CM | POA: Diagnosis not present

## 2023-07-20 DIAGNOSIS — E669 Obesity, unspecified: Secondary | ICD-10-CM | POA: Diagnosis not present

## 2023-07-20 DIAGNOSIS — S01301A Unspecified open wound of right ear, initial encounter: Secondary | ICD-10-CM | POA: Diagnosis not present

## 2023-07-20 DIAGNOSIS — F424 Excoriation (skin-picking) disorder: Secondary | ICD-10-CM | POA: Diagnosis not present

## 2023-07-20 DIAGNOSIS — C44519 Basal cell carcinoma of skin of other part of trunk: Secondary | ICD-10-CM | POA: Diagnosis not present

## 2023-07-25 DIAGNOSIS — M6281 Muscle weakness (generalized): Secondary | ICD-10-CM | POA: Diagnosis not present

## 2023-07-25 DIAGNOSIS — F430 Acute stress reaction: Secondary | ICD-10-CM | POA: Diagnosis not present

## 2023-07-25 DIAGNOSIS — G4700 Insomnia, unspecified: Secondary | ICD-10-CM | POA: Diagnosis not present

## 2023-07-25 DIAGNOSIS — F411 Generalized anxiety disorder: Secondary | ICD-10-CM | POA: Diagnosis not present

## 2023-07-25 DIAGNOSIS — F331 Major depressive disorder, recurrent, moderate: Secondary | ICD-10-CM | POA: Diagnosis not present

## 2023-07-25 DIAGNOSIS — F515 Nightmare disorder: Secondary | ICD-10-CM | POA: Diagnosis not present

## 2023-07-25 DIAGNOSIS — I5022 Chronic systolic (congestive) heart failure: Secondary | ICD-10-CM | POA: Diagnosis not present

## 2023-07-25 DIAGNOSIS — F5105 Insomnia due to other mental disorder: Secondary | ICD-10-CM | POA: Diagnosis not present

## 2023-07-25 DIAGNOSIS — Z79899 Other long term (current) drug therapy: Secondary | ICD-10-CM | POA: Diagnosis not present

## 2023-07-25 DIAGNOSIS — J449 Chronic obstructive pulmonary disease, unspecified: Secondary | ICD-10-CM | POA: Diagnosis not present

## 2023-07-25 DIAGNOSIS — G3184 Mild cognitive impairment, so stated: Secondary | ICD-10-CM | POA: Diagnosis not present

## 2023-07-25 DIAGNOSIS — F1011 Alcohol abuse, in remission: Secondary | ICD-10-CM | POA: Diagnosis not present

## 2023-07-26 DIAGNOSIS — E669 Obesity, unspecified: Secondary | ICD-10-CM | POA: Diagnosis not present

## 2023-07-26 DIAGNOSIS — S01301A Unspecified open wound of right ear, initial encounter: Secondary | ICD-10-CM | POA: Diagnosis not present

## 2023-07-26 DIAGNOSIS — C44519 Basal cell carcinoma of skin of other part of trunk: Secondary | ICD-10-CM | POA: Diagnosis not present

## 2023-07-26 DIAGNOSIS — F424 Excoriation (skin-picking) disorder: Secondary | ICD-10-CM | POA: Diagnosis not present

## 2023-07-27 DIAGNOSIS — F430 Acute stress reaction: Secondary | ICD-10-CM | POA: Diagnosis not present

## 2023-07-27 DIAGNOSIS — F3341 Major depressive disorder, recurrent, in partial remission: Secondary | ICD-10-CM | POA: Diagnosis not present

## 2023-08-02 DIAGNOSIS — C44519 Basal cell carcinoma of skin of other part of trunk: Secondary | ICD-10-CM | POA: Diagnosis not present

## 2023-08-02 DIAGNOSIS — E669 Obesity, unspecified: Secondary | ICD-10-CM | POA: Diagnosis not present

## 2023-08-02 DIAGNOSIS — S01301A Unspecified open wound of right ear, initial encounter: Secondary | ICD-10-CM | POA: Diagnosis not present

## 2023-08-02 DIAGNOSIS — F424 Excoriation (skin-picking) disorder: Secondary | ICD-10-CM | POA: Diagnosis not present

## 2023-08-15 DIAGNOSIS — F430 Acute stress reaction: Secondary | ICD-10-CM | POA: Diagnosis not present

## 2023-08-15 DIAGNOSIS — F331 Major depressive disorder, recurrent, moderate: Secondary | ICD-10-CM | POA: Diagnosis not present

## 2023-08-24 DIAGNOSIS — F331 Major depressive disorder, recurrent, moderate: Secondary | ICD-10-CM | POA: Diagnosis not present

## 2023-08-24 DIAGNOSIS — G8929 Other chronic pain: Secondary | ICD-10-CM | POA: Diagnosis not present

## 2023-08-27 DIAGNOSIS — F515 Nightmare disorder: Secondary | ICD-10-CM | POA: Diagnosis not present

## 2023-08-27 DIAGNOSIS — F3170 Bipolar disorder, currently in remission, most recent episode unspecified: Secondary | ICD-10-CM | POA: Diagnosis not present

## 2023-08-27 DIAGNOSIS — G3184 Mild cognitive impairment, so stated: Secondary | ICD-10-CM | POA: Diagnosis not present

## 2023-08-27 DIAGNOSIS — F411 Generalized anxiety disorder: Secondary | ICD-10-CM | POA: Diagnosis not present

## 2023-08-27 DIAGNOSIS — F5105 Insomnia due to other mental disorder: Secondary | ICD-10-CM | POA: Diagnosis not present

## 2023-08-29 DIAGNOSIS — F331 Major depressive disorder, recurrent, moderate: Secondary | ICD-10-CM | POA: Diagnosis not present

## 2023-09-12 DIAGNOSIS — R531 Weakness: Secondary | ICD-10-CM | POA: Diagnosis not present

## 2023-09-12 DIAGNOSIS — F411 Generalized anxiety disorder: Secondary | ICD-10-CM | POA: Diagnosis not present

## 2023-09-12 DIAGNOSIS — M1990 Unspecified osteoarthritis, unspecified site: Secondary | ICD-10-CM | POA: Diagnosis not present

## 2023-09-12 DIAGNOSIS — I639 Cerebral infarction, unspecified: Secondary | ICD-10-CM | POA: Diagnosis not present

## 2023-09-12 DIAGNOSIS — I10 Essential (primary) hypertension: Secondary | ICD-10-CM | POA: Diagnosis not present

## 2023-09-12 DIAGNOSIS — F3170 Bipolar disorder, currently in remission, most recent episode unspecified: Secondary | ICD-10-CM | POA: Diagnosis not present

## 2023-09-12 DIAGNOSIS — G40909 Epilepsy, unspecified, not intractable, without status epilepticus: Secondary | ICD-10-CM | POA: Diagnosis not present

## 2023-09-12 DIAGNOSIS — F419 Anxiety disorder, unspecified: Secondary | ICD-10-CM | POA: Diagnosis not present

## 2023-09-18 DIAGNOSIS — I69359 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: Secondary | ICD-10-CM | POA: Diagnosis not present

## 2023-09-18 DIAGNOSIS — I5022 Chronic systolic (congestive) heart failure: Secondary | ICD-10-CM | POA: Diagnosis not present

## 2023-09-18 DIAGNOSIS — N184 Chronic kidney disease, stage 4 (severe): Secondary | ICD-10-CM | POA: Diagnosis not present

## 2023-09-18 DIAGNOSIS — R5383 Other fatigue: Secondary | ICD-10-CM | POA: Diagnosis not present

## 2023-09-18 DIAGNOSIS — J449 Chronic obstructive pulmonary disease, unspecified: Secondary | ICD-10-CM | POA: Diagnosis not present

## 2023-09-24 DIAGNOSIS — I5022 Chronic systolic (congestive) heart failure: Secondary | ICD-10-CM | POA: Diagnosis not present

## 2023-09-24 DIAGNOSIS — N1832 Chronic kidney disease, stage 3b: Secondary | ICD-10-CM | POA: Diagnosis not present

## 2023-09-24 DIAGNOSIS — D631 Anemia in chronic kidney disease: Secondary | ICD-10-CM | POA: Diagnosis not present

## 2023-09-24 DIAGNOSIS — N2581 Secondary hyperparathyroidism of renal origin: Secondary | ICD-10-CM | POA: Diagnosis not present

## 2023-09-24 IMAGING — DX DG CHEST 1V PORT
1 series · 2 of 2 positions shown · non-contrast
Comparison: Radiograph 12/04/2020

CLINICAL DATA: Shortness of breath and wheezing

EXAM:
PORTABLE CHEST 1 VIEW

[Series 1: chest ap · 0.14mm/px · 2 of 2 slices shown]
[im 1/2]
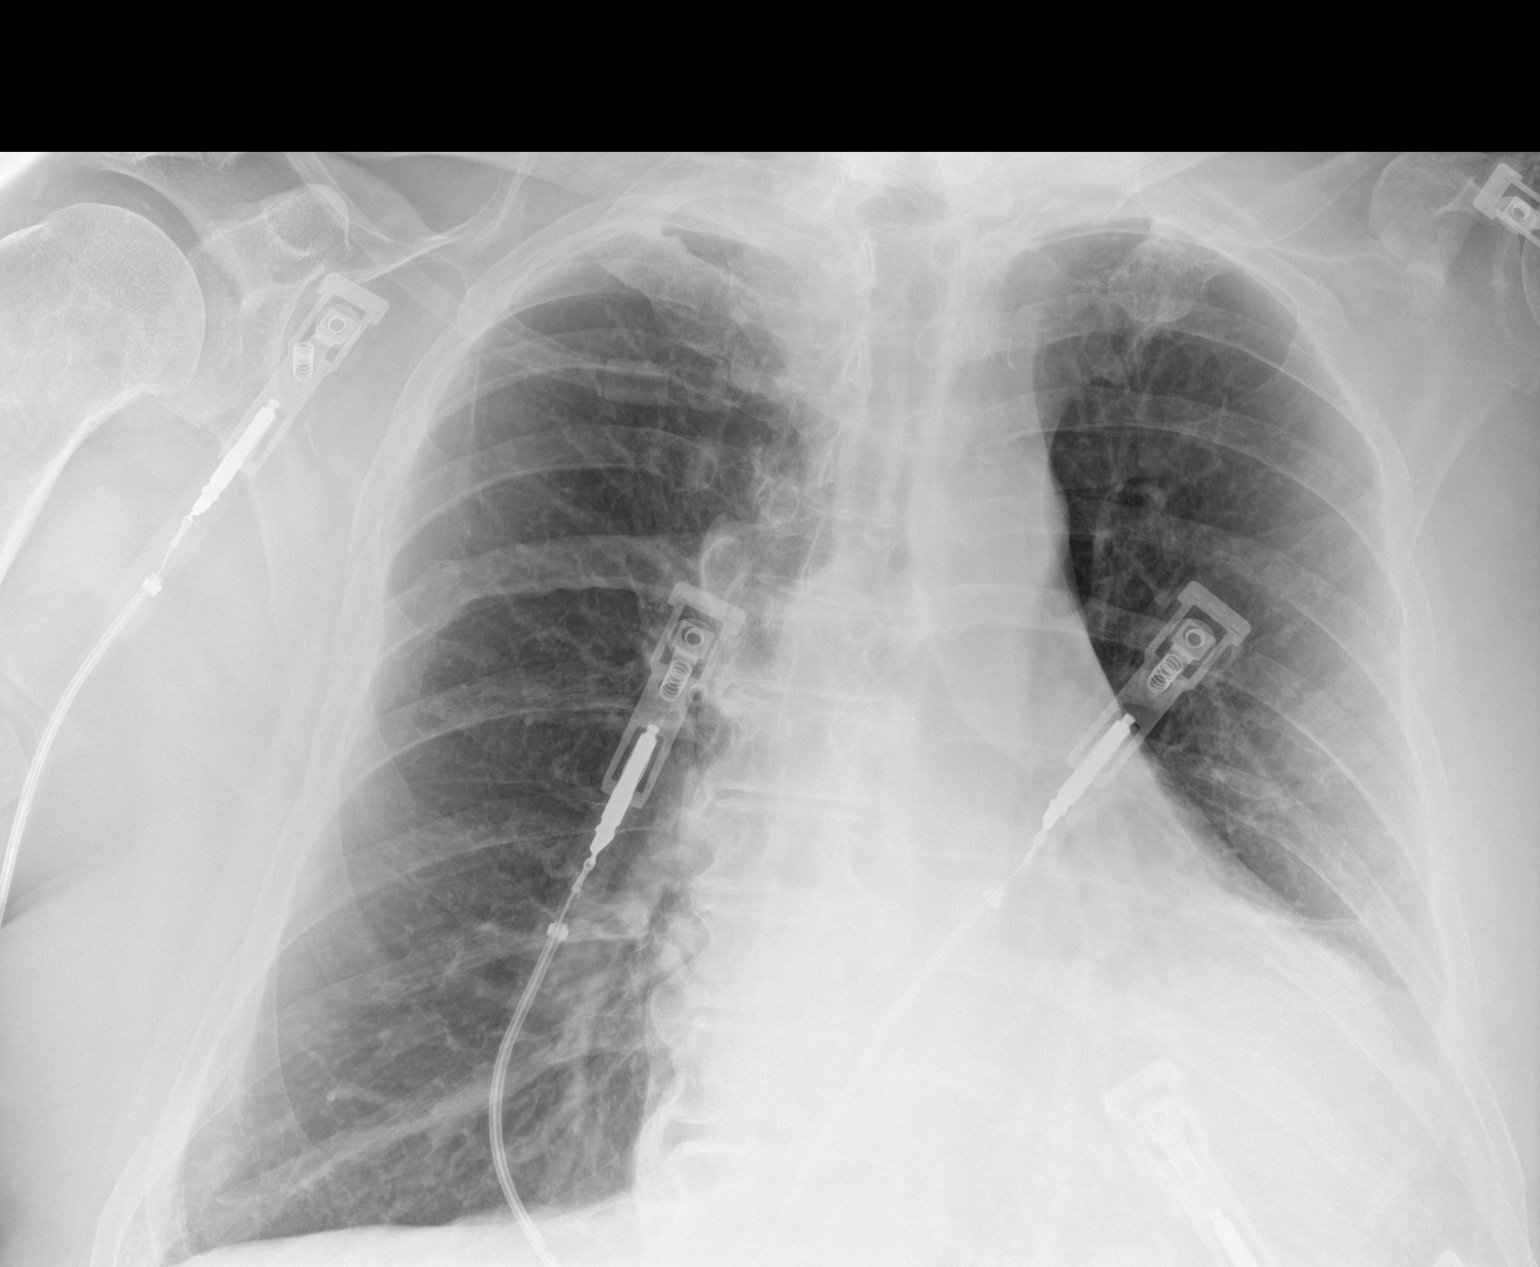
[im 2/2]
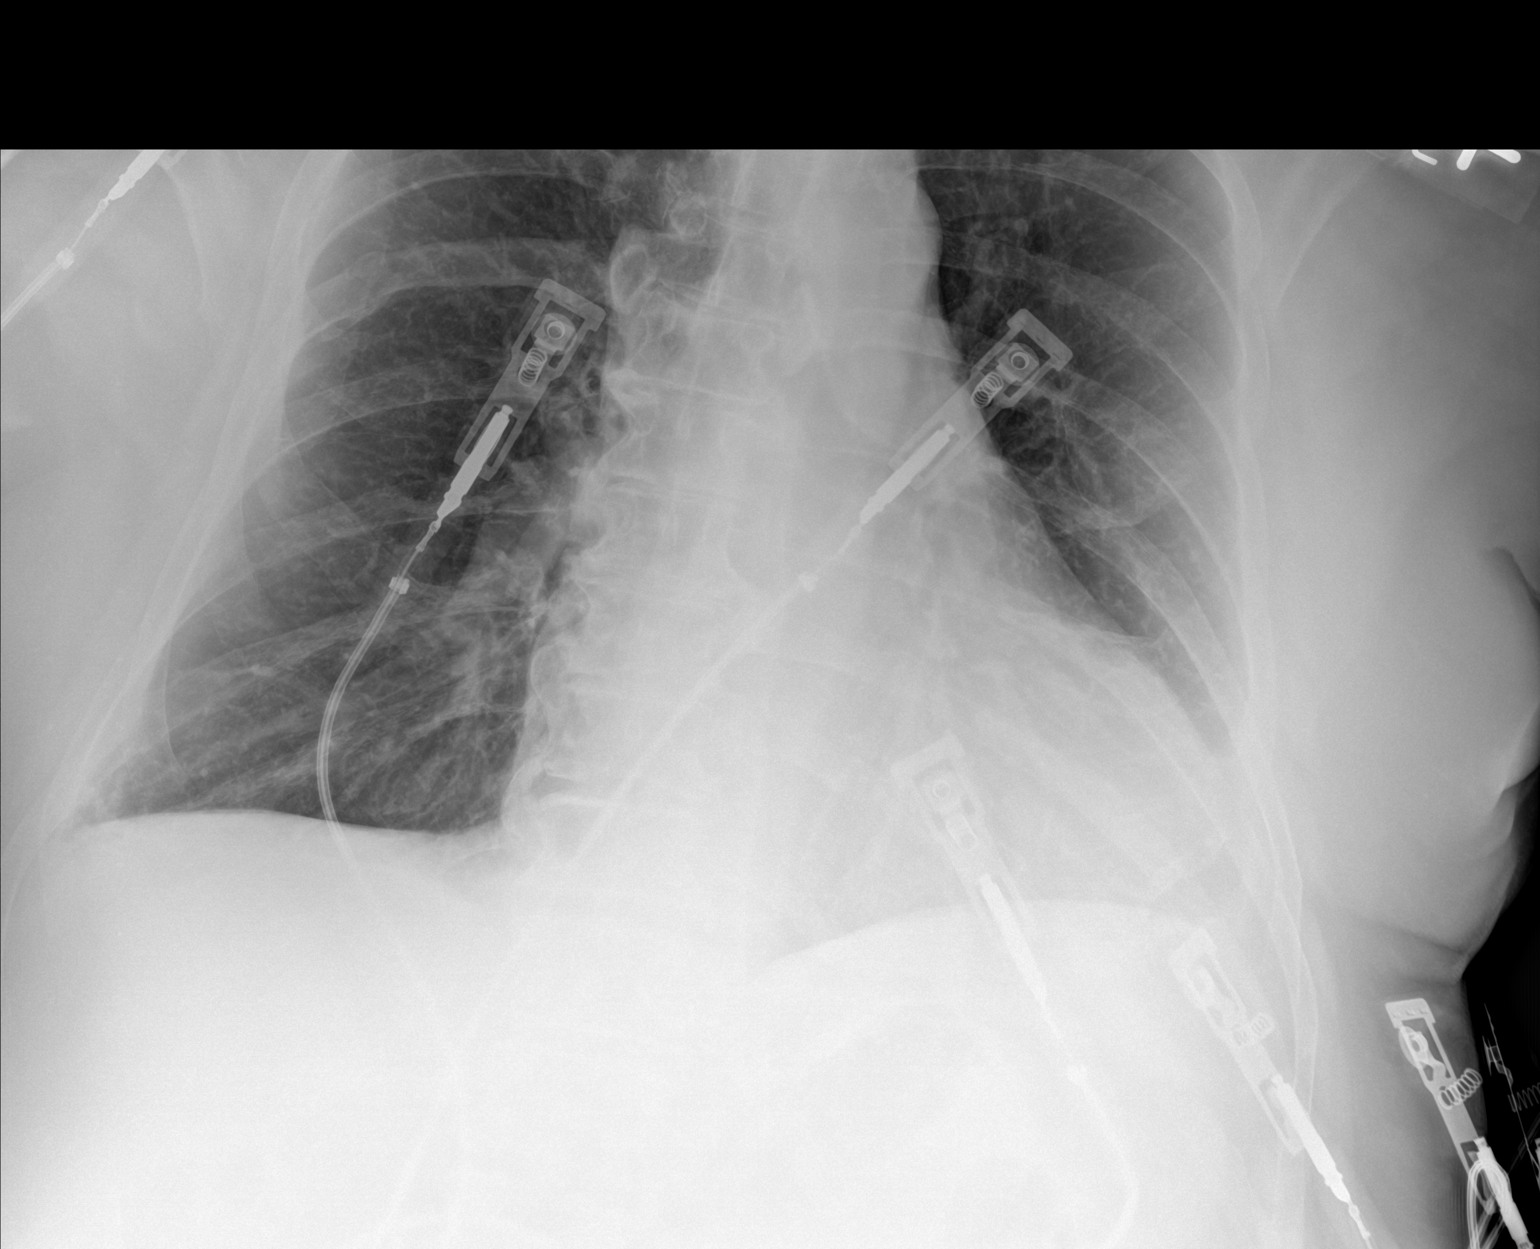

[2 of 2 positions shown; findings below may reference images not displayed]

FINDINGS: Stable heart size and mediastinal contours. The lungs are
hyperinflated. There is slight increased bronchial thickening from
prior exam. No focal airspace disease. Blunting of the costophrenic
angles felt to be due to hyperinflation rather than pleural
effusion. No pneumothorax. Remote right rib fracture. Thoracic spine
spondylosis.
IMPRESSION: Chronic hyperinflation. Slight increased bronchial thickening from
prior exam may be acute bronchitis or asthma.

## 2023-09-25 IMAGING — US US EXTREM LOW VENOUS
1 series · 14 of 24 positions shown · non-contrast
Comparison: None.

CLINICAL DATA: Shortness of breath, bilateral leg edema

EXAM:
BILATERAL LOWER EXTREMITY VENOUS DOPPLER ULTRASOUND
TECHNIQUE: Gray-scale sonography with compression, as well as color and duplex
ultrasound, were performed to evaluate the deep venous system(s)
from the level of the common femoral vein through the popliteal and
proximal calf veins.

[Series 1: us venous img lower bilat (dvt) · portal-venous · 14 of 56 slices shown]
[im 1/56]
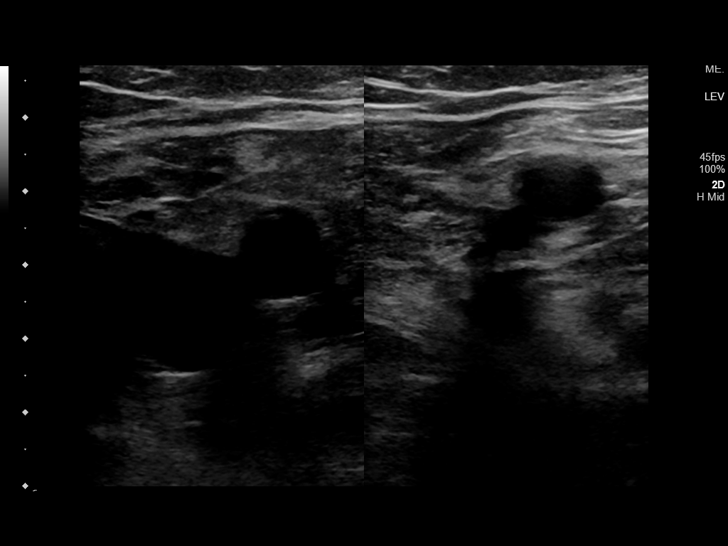
[im 5/56]
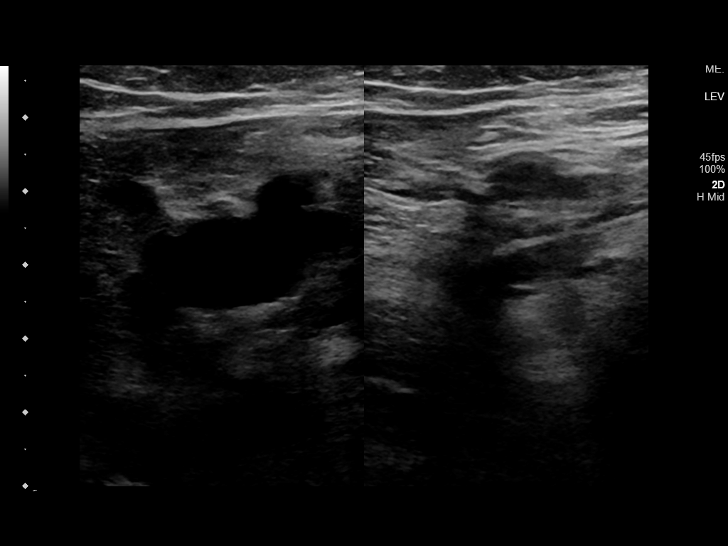
[im 10/56]
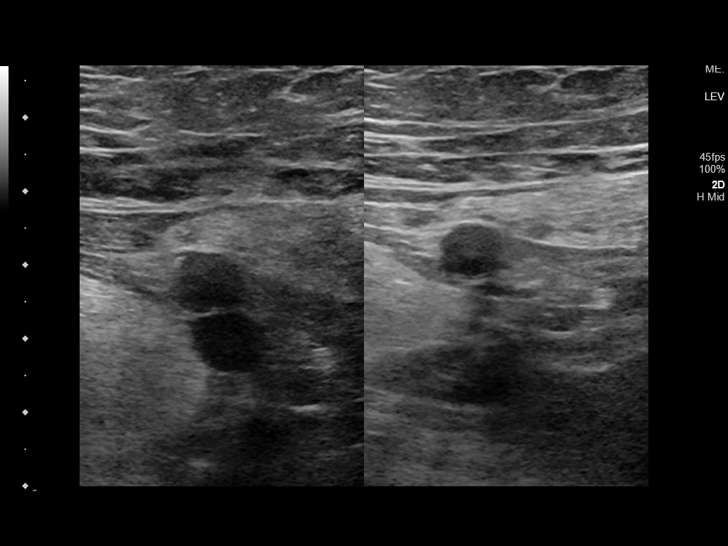
[im 15/56]
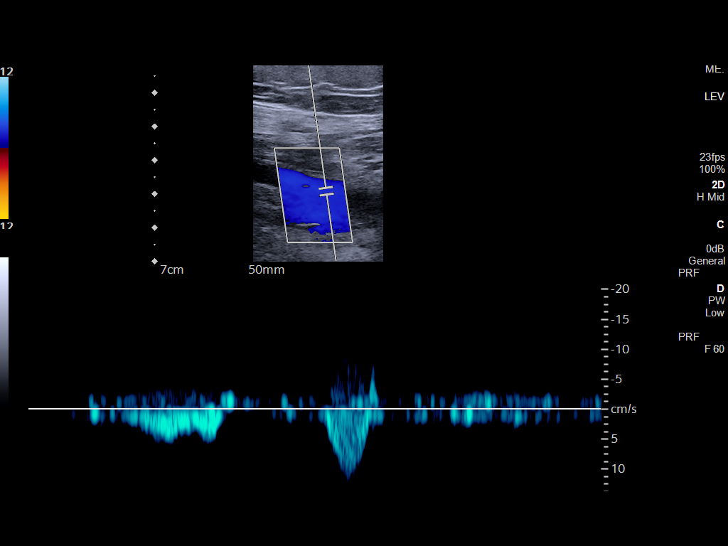
[im 17/56]
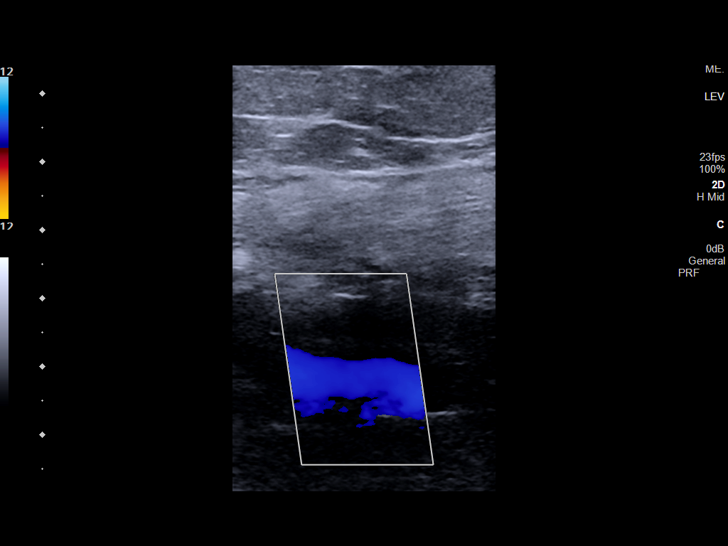
[im 22/56]
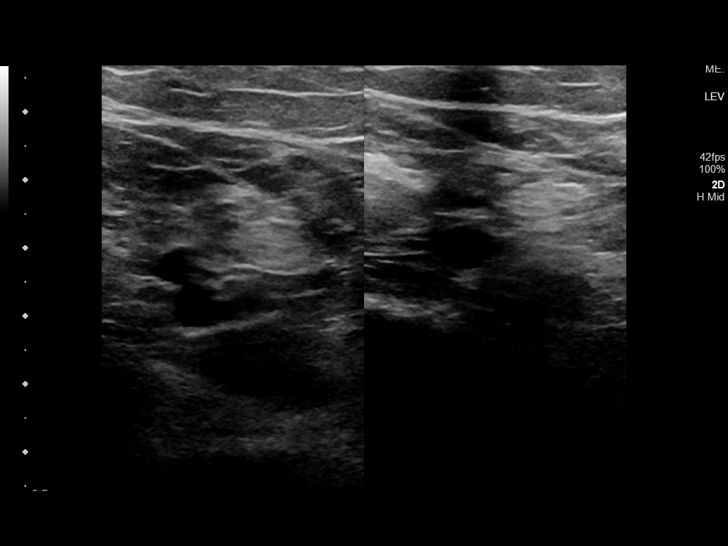
[im 27/56]
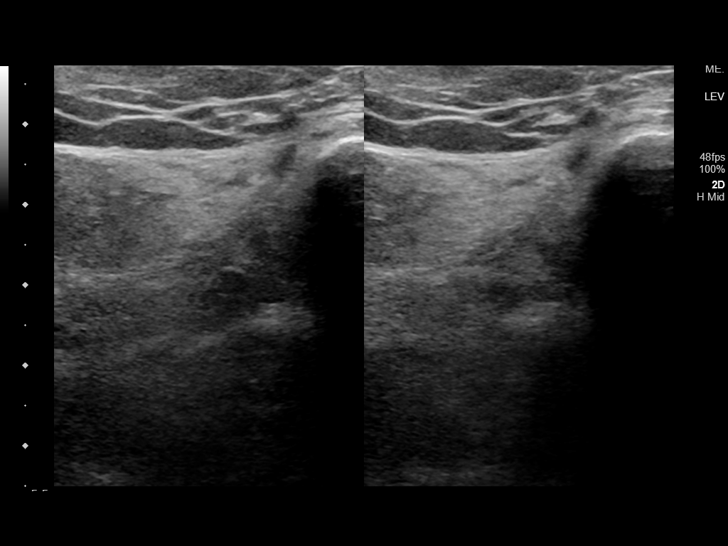
[im 29/56]
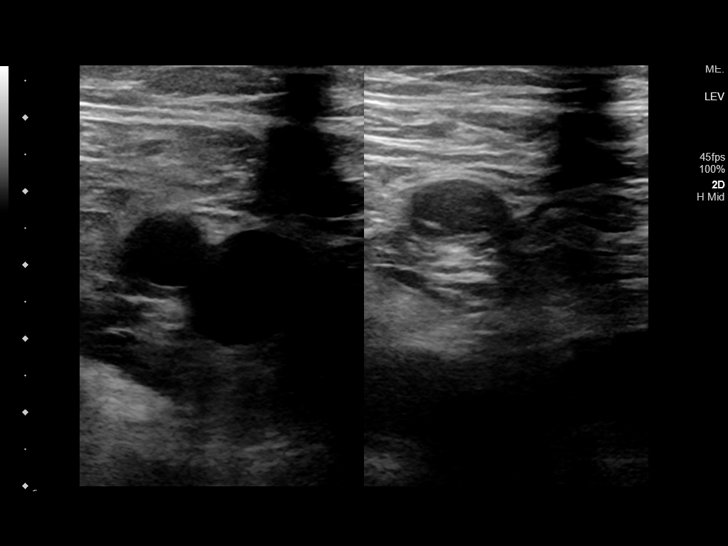
[im 34/56]
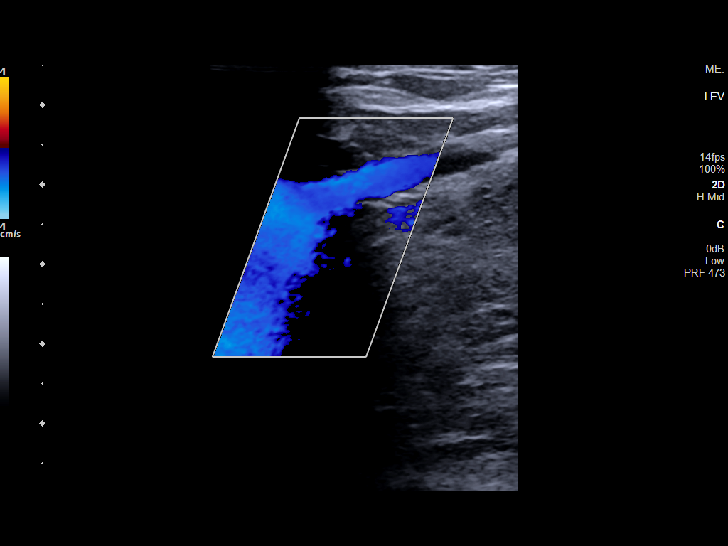
[im 39/56]
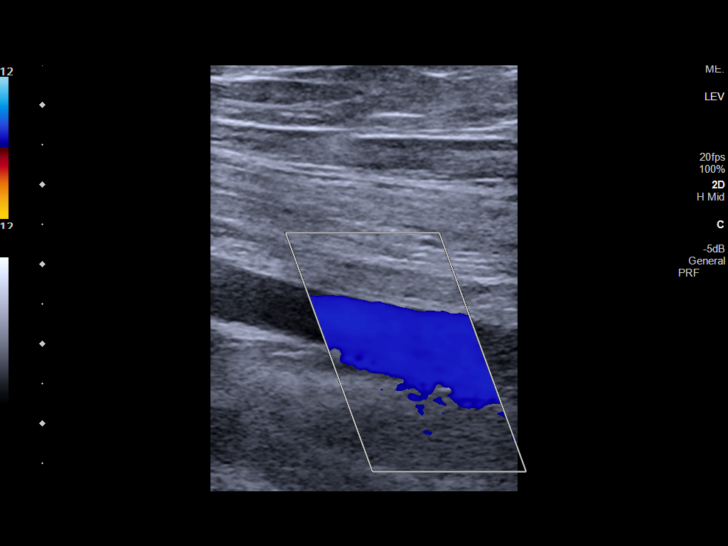
[im 44/56]
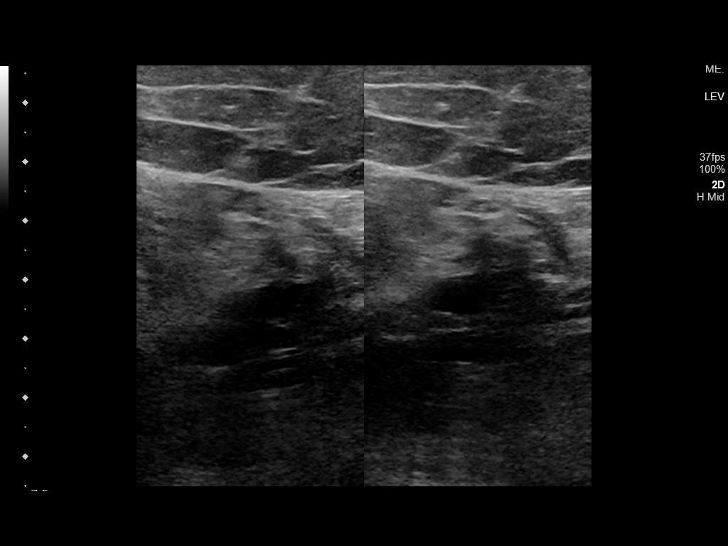
[im 46/56]
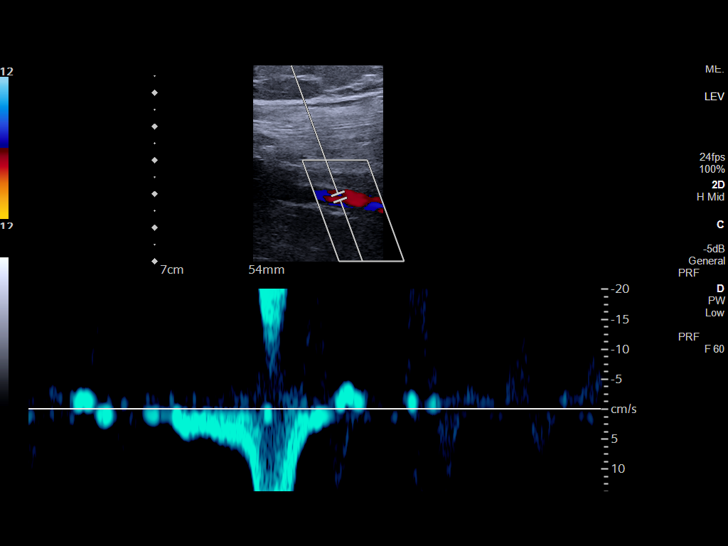
[im 51/56]
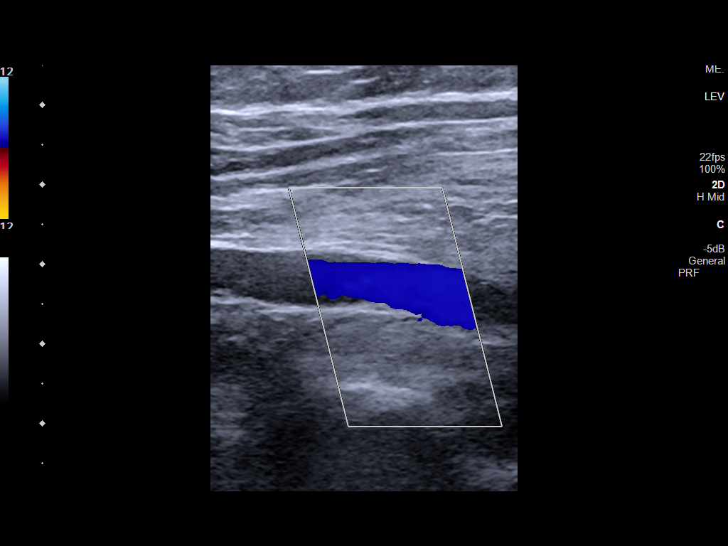
[im 56/56]
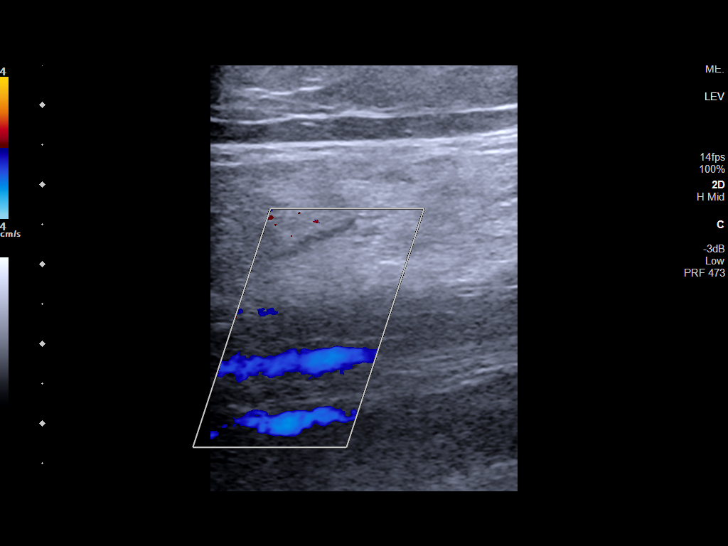

[14 of 24 positions shown; findings below may reference images not displayed]

FINDINGS: VENOUS

Normal compressibility of the common femoral, superficial femoral,
and popliteal veins, as well as the visualized calf veins.
Visualized portions of profunda femoral vein and great saphenous
vein unremarkable. No filling defects to suggest DVT on grayscale or
color Doppler imaging. Doppler waveforms show normal direction of
venous flow, normal respiratory plasticity and response to
augmentation.

OTHER

None.

Limitations: none
IMPRESSION: Negative.

## 2023-09-26 DIAGNOSIS — G3184 Mild cognitive impairment, so stated: Secondary | ICD-10-CM | POA: Diagnosis not present

## 2023-09-26 DIAGNOSIS — F339 Major depressive disorder, recurrent, unspecified: Secondary | ICD-10-CM | POA: Diagnosis not present

## 2023-09-26 DIAGNOSIS — F430 Acute stress reaction: Secondary | ICD-10-CM | POA: Diagnosis not present

## 2023-09-26 DIAGNOSIS — F5105 Insomnia due to other mental disorder: Secondary | ICD-10-CM | POA: Diagnosis not present

## 2023-09-26 DIAGNOSIS — F319 Bipolar disorder, unspecified: Secondary | ICD-10-CM | POA: Diagnosis not present

## 2023-09-26 DIAGNOSIS — F411 Generalized anxiety disorder: Secondary | ICD-10-CM | POA: Diagnosis not present

## 2023-09-26 DIAGNOSIS — F515 Nightmare disorder: Secondary | ICD-10-CM | POA: Diagnosis not present

## 2023-09-27 IMAGING — US US RENAL
1 series · 14 of 25 positions shown · non-contrast
Comparison: None.

CLINICAL DATA: [facility]

EXAM:
RENAL / URINARY TRACT ULTRASOUND COMPLETE

[Series 1: us renal · 14 of 52 slices shown]
[im 1/52]
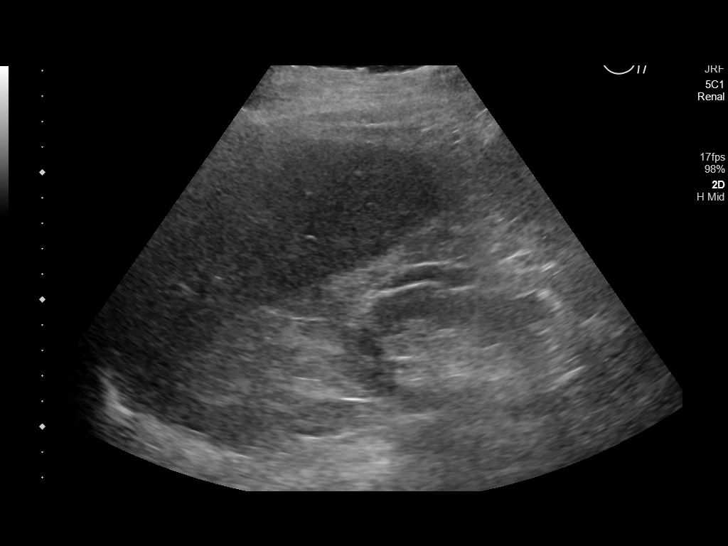
[im 5/52]
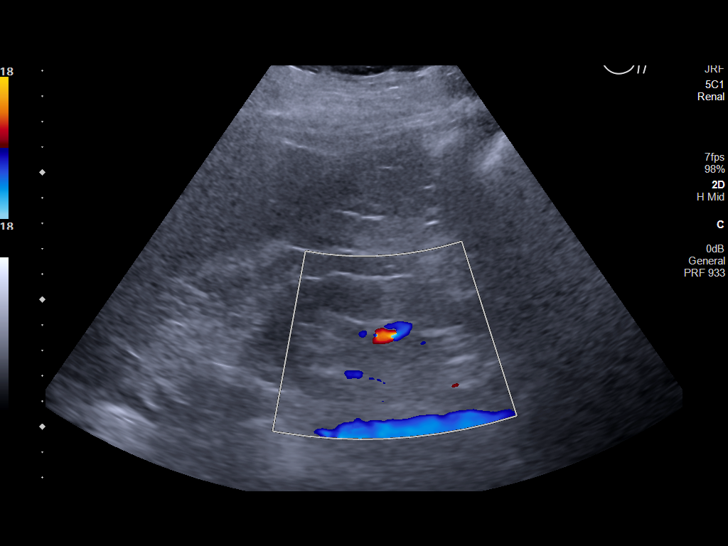
[im 9/52]
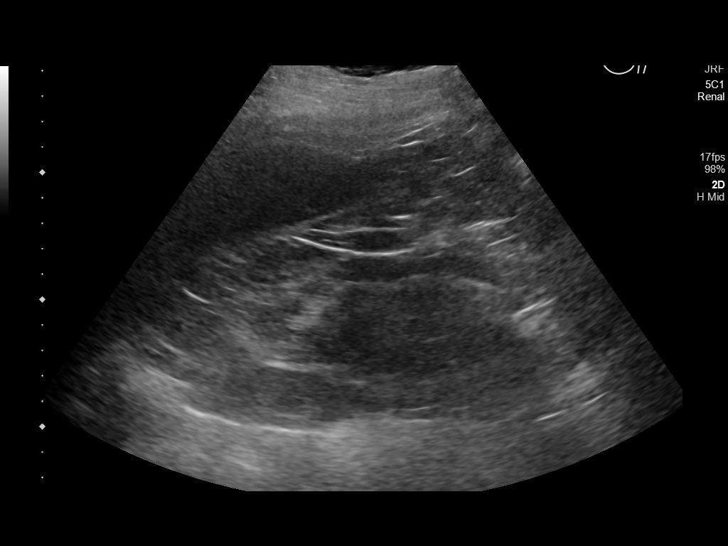
[im 13/52]
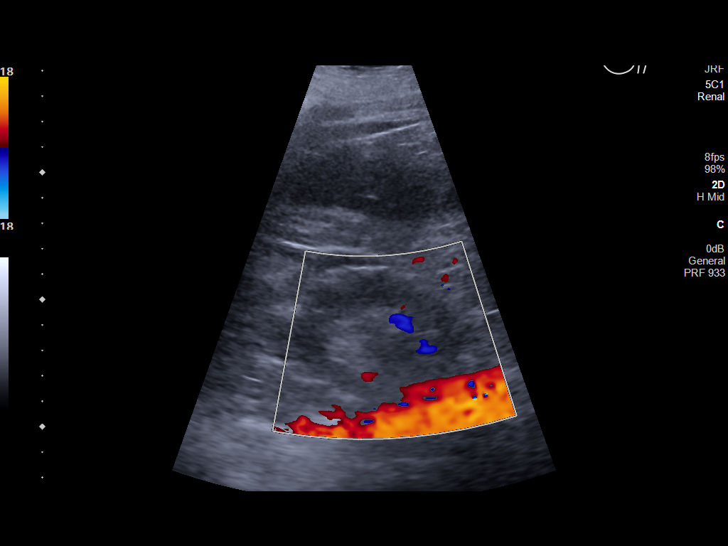
[im 18/52]
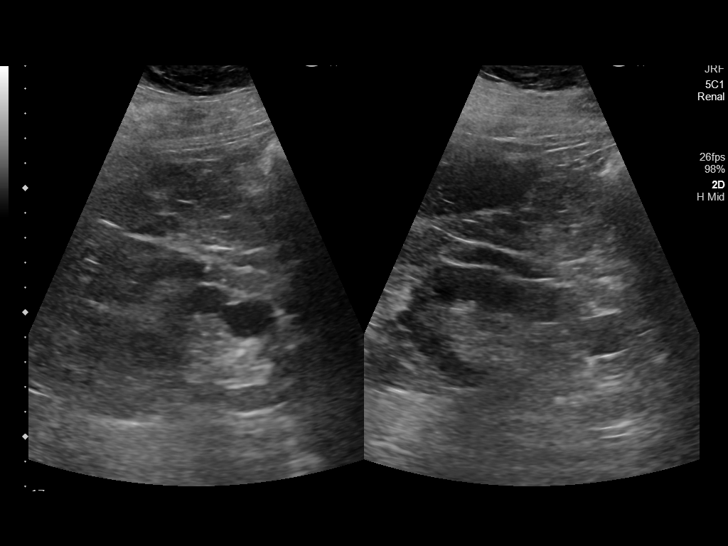
[im 20/52]
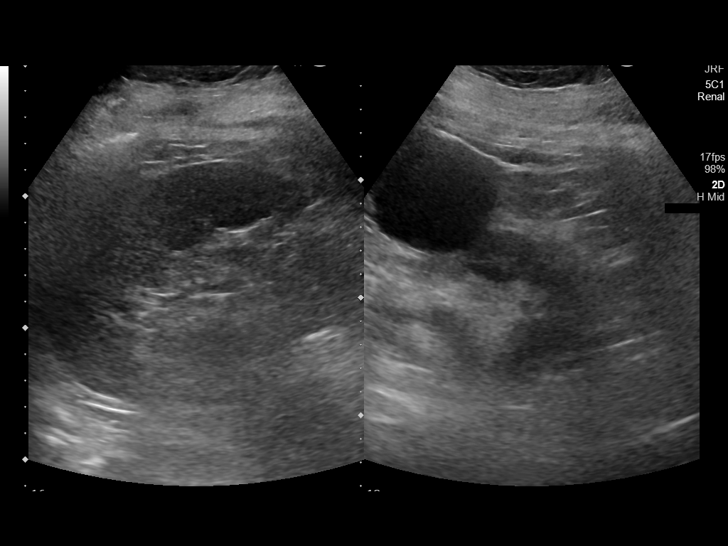
[im 24/52]
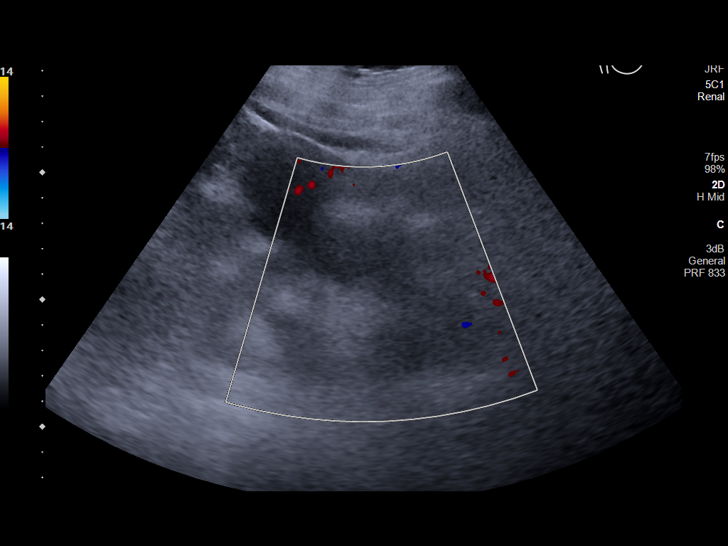
[im 28/52]
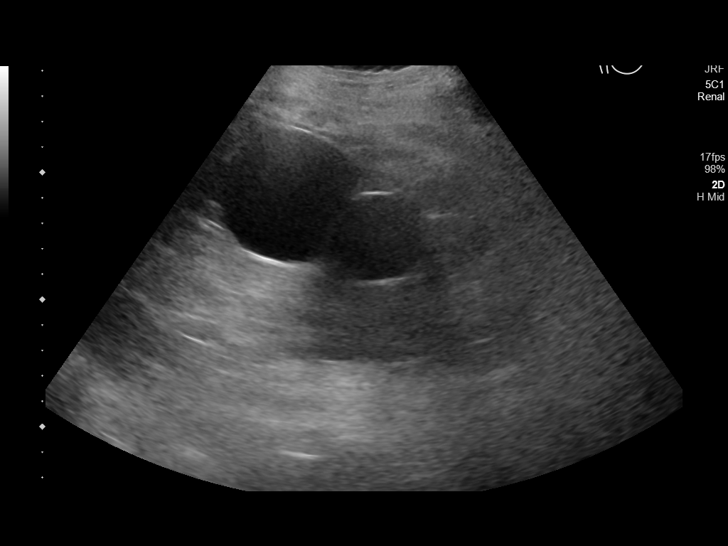
[im 32/52]
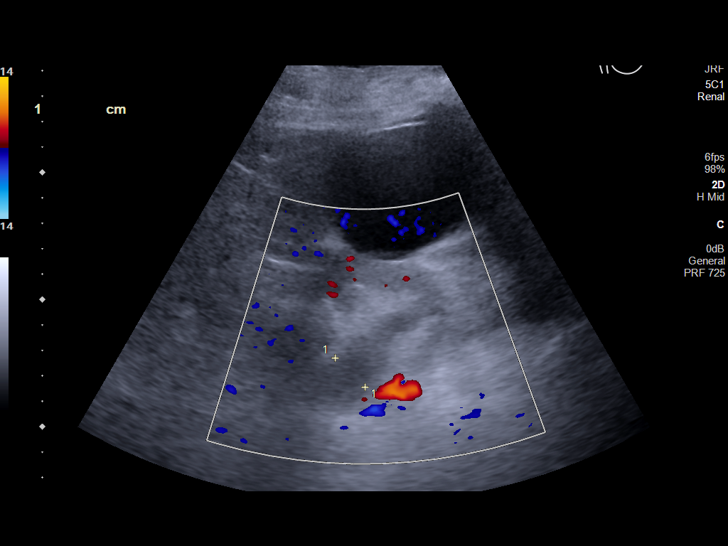
[im 35/52]
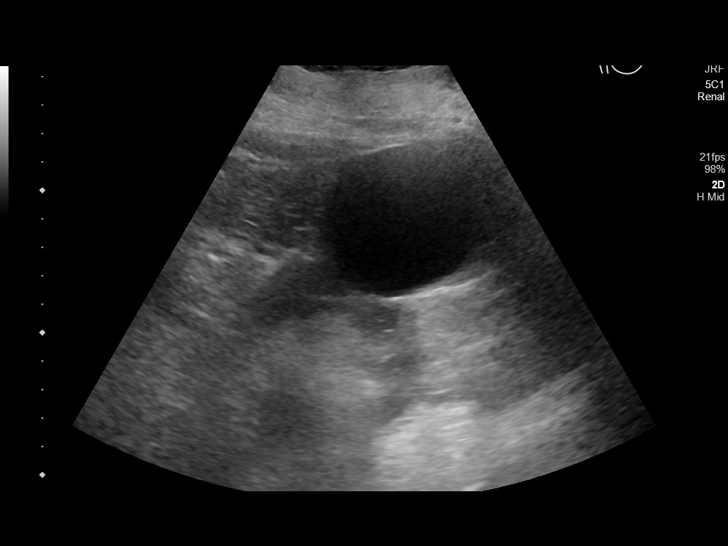
[im 39/52]
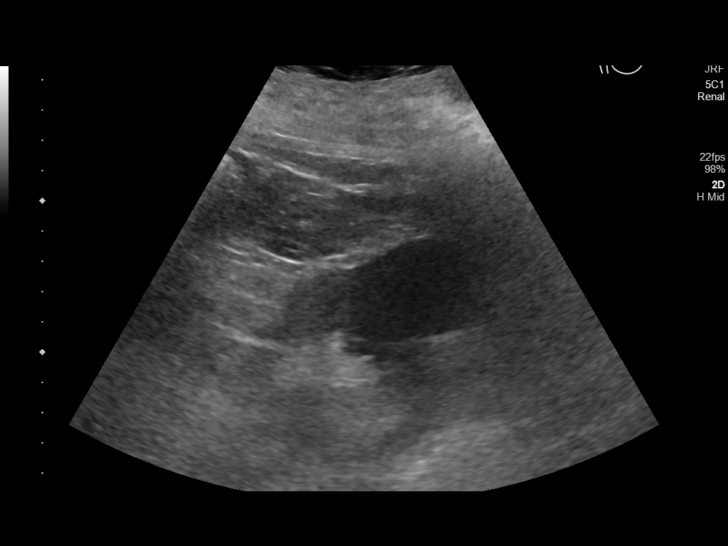
[im 43/52]
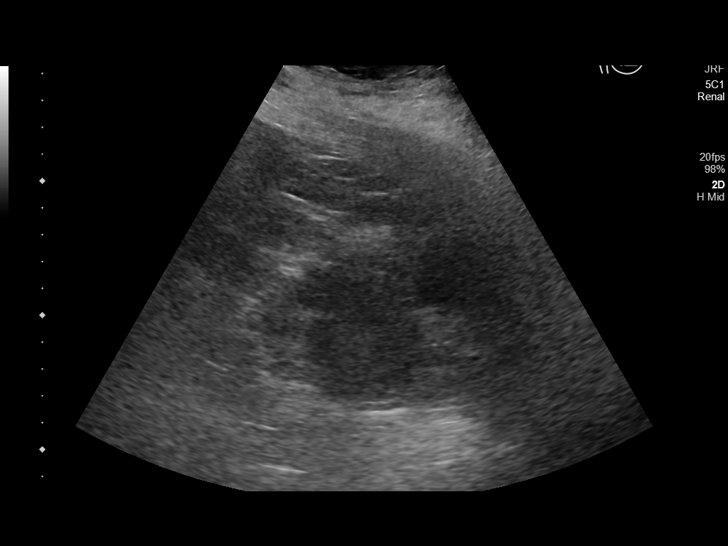
[im 47/52]
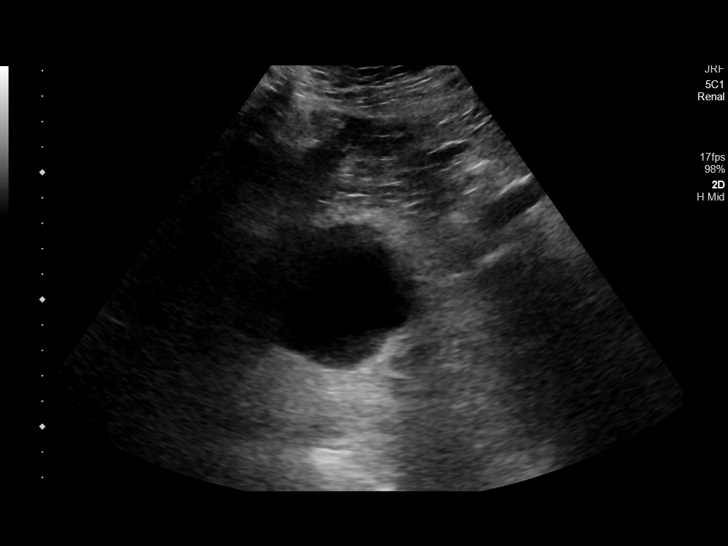
[im 52/52]
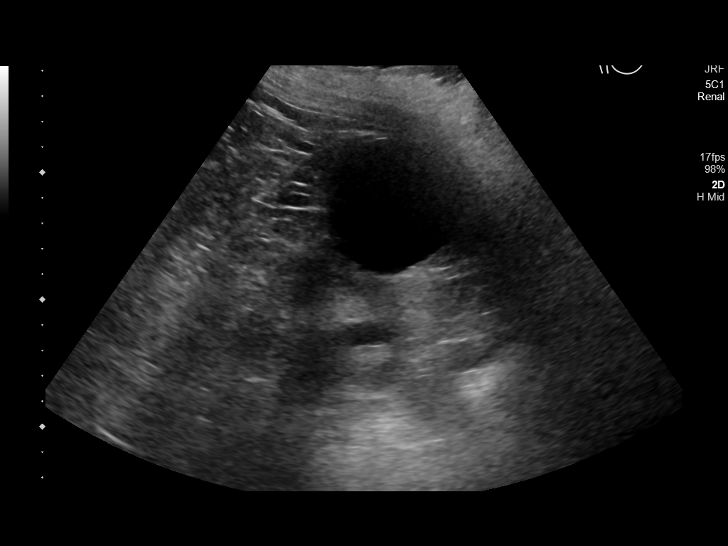

[14 of 25 positions shown; findings below may reference images not displayed]

FINDINGS: Right Kidney:

Renal measurements: 11.4 x 5 x 5.2 cm = volume: 156 mL. Echogenicity
is increased. Cysts are present at the lower pole. No hydronephrosis
visualized.

Left Kidney:

Renal measurements: 11.9 x 6.3 x 5.4 cm = volume: 213 mL.
Echogenicity is increased. Cysts are present at the upper pole. No
hydronephrosis visualized.

Bladder:

Appears normal for degree of bladder distention.

Other:

None.
IMPRESSION: No hydronephrosis. Increased renal echogenicity suggesting medical
renal disease.

## 2023-09-29 IMAGING — DX DG CHEST 1V PORT
2 series · 2 of 2 positions shown · non-contrast
Comparison: Chest radiograph dated 06/12/2021.

CLINICAL DATA: Shortness of breath.

EXAM:
PORTABLE CHEST 1 VIEW

[chest ap (1 of 2)]
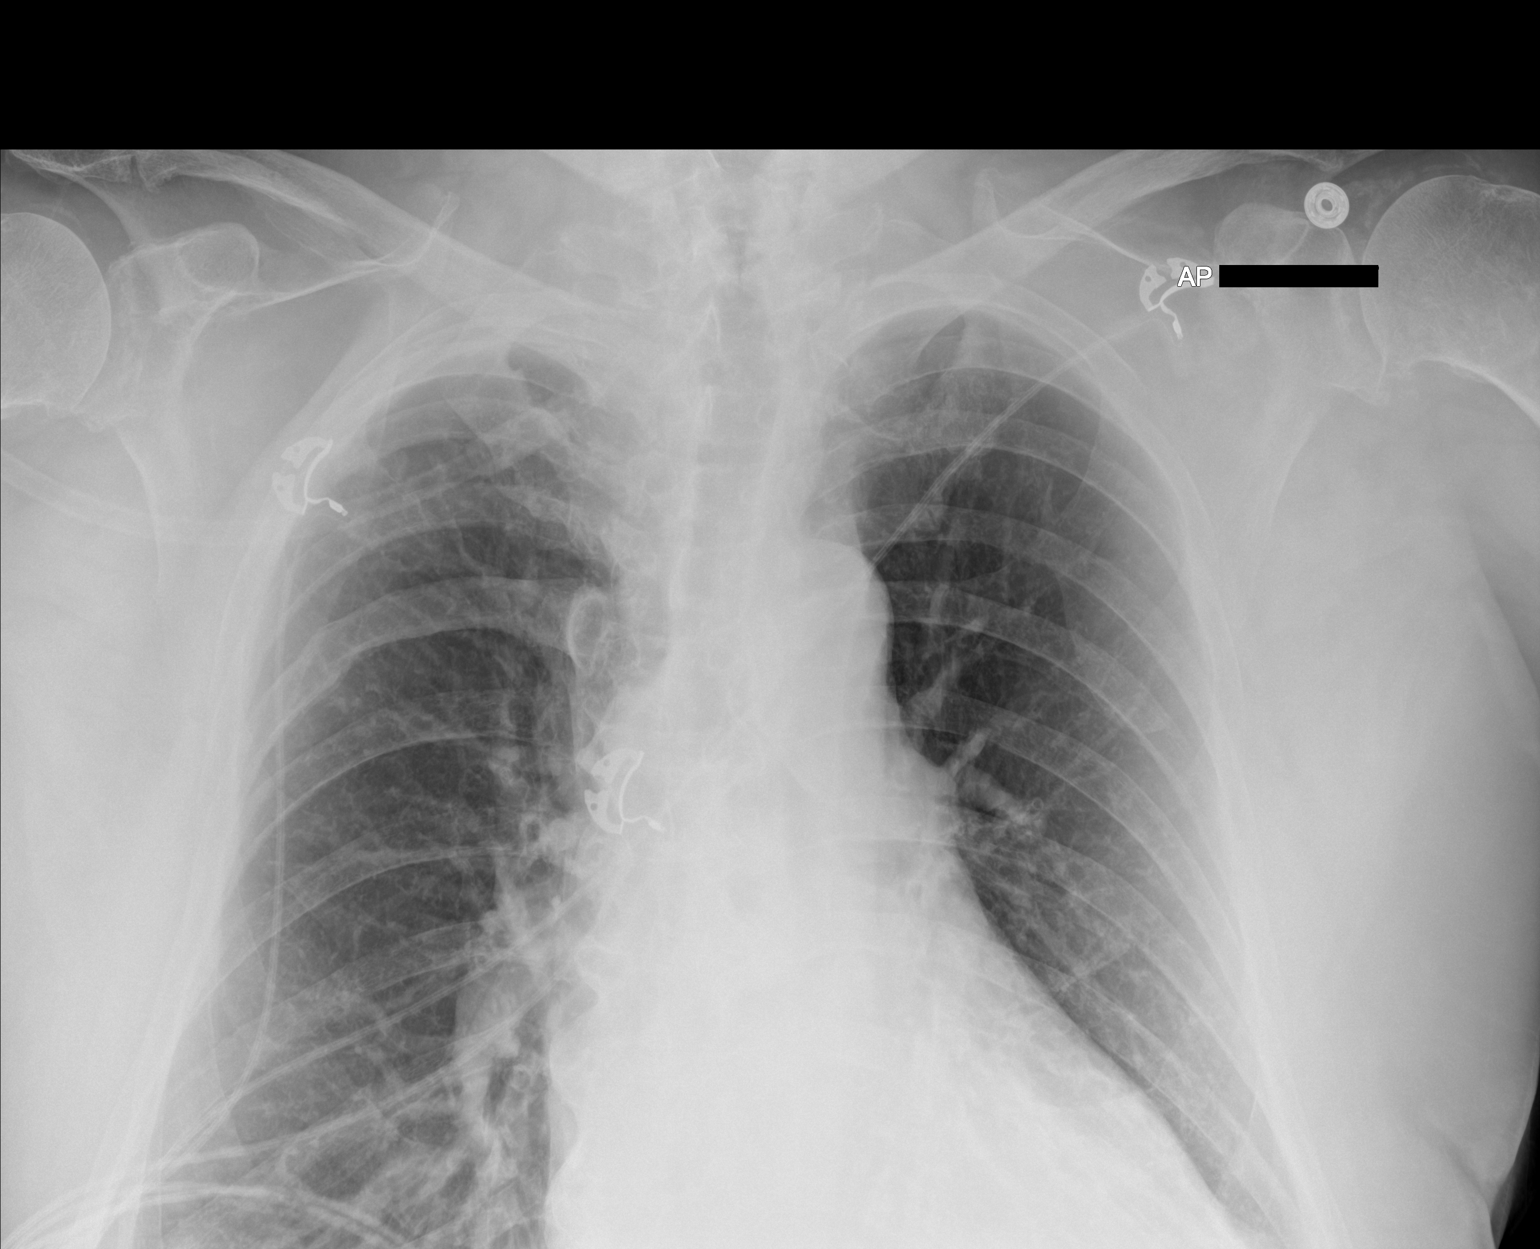

[chest ap (2 of 2)]
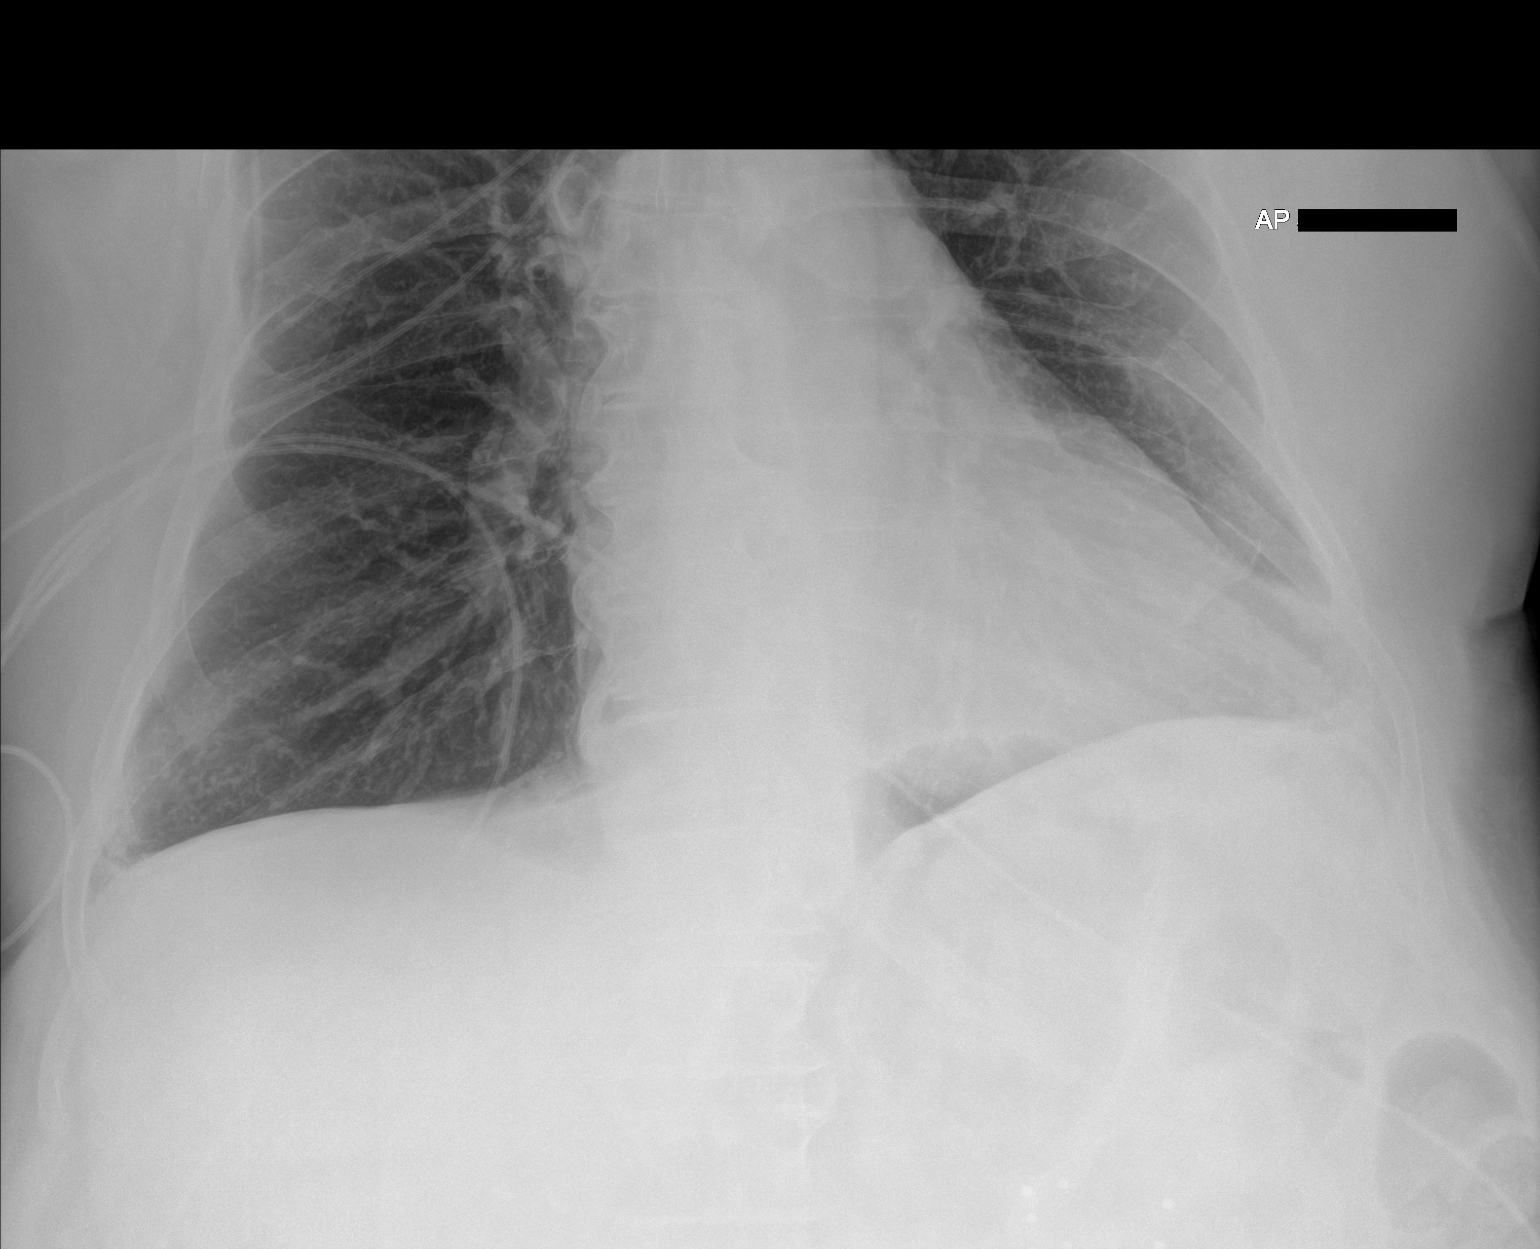

[2 of 2 positions shown; findings below may reference images not displayed]

FINDINGS: No focal consolidation, pleural effusion, or pneumothorax. Mild
cardiomegaly. No acute osseous pathology.
IMPRESSION: No acute cardiopulmonary process. Mild cardiomegaly.

## 2023-10-10 DIAGNOSIS — F430 Acute stress reaction: Secondary | ICD-10-CM | POA: Diagnosis not present

## 2023-10-10 DIAGNOSIS — F339 Major depressive disorder, recurrent, unspecified: Secondary | ICD-10-CM | POA: Diagnosis not present

## 2023-10-11 DIAGNOSIS — F430 Acute stress reaction: Secondary | ICD-10-CM | POA: Diagnosis not present

## 2023-10-11 DIAGNOSIS — F331 Major depressive disorder, recurrent, moderate: Secondary | ICD-10-CM | POA: Diagnosis not present

## 2023-10-11 DIAGNOSIS — G8929 Other chronic pain: Secondary | ICD-10-CM | POA: Diagnosis not present

## 2024-03-18 DIAGNOSIS — M1712 Unilateral primary osteoarthritis, left knee: Secondary | ICD-10-CM | POA: Diagnosis not present

## 2024-03-18 DIAGNOSIS — M6281 Muscle weakness (generalized): Secondary | ICD-10-CM | POA: Diagnosis not present

## 2024-05-23 ENCOUNTER — Encounter: Payer: Self-pay | Admitting: Ophthalmology

## 2024-05-23 NOTE — Anesthesia Preprocedure Evaluation (Addendum)
 Anesthesia Evaluation  Patient identified by MRN, date of birth, ID band Patient awake    Reviewed: Allergy & Precautions, H&P , NPO status , Patient's Chart, lab work & pertinent test results  Airway Mallampati: III     Mouth opening: Pediatric Airway  Dental  (+) Chipped, Missing, Poor Dentition Missing all but 8 teeth, some chipped::   Pulmonary neg pulmonary ROS, asthma , sleep apnea , COPD          Cardiovascular hypertension, + CAD and +CHF  negative cardio ROS + dysrhythmias   10-24-21 echo 1. Left ventricular ejection fraction, by estimation, is 30 to 35%. The  left ventricle has moderate to severely decreased function. The left  ventricle demonstrates global hypokinesis.   2. Right ventricular systolic function is low normal. The right  ventricular size is mildly enlarged.   3. Left atrial size was moderately dilated. No left atrial/left atrial  appendage thrombus was detected.   4. Right atrial size was mildly dilated.   5. The mitral valve is normal in structure. Mild mitral valve  regurgitation.   6. Tricuspid valve regurgitation is mild to moderate.   7. The aortic valve is tricuspid. Aortic valve regurgitation is not  visualized.     Neuro/Psych Seizures -,  PSYCHIATRIC DISORDERS Anxiety Depression Bipolar Disorder  Dementia  Neuromuscular disease negative neurological ROS  negative psych ROS   GI/Hepatic negative GI ROS, Neg liver ROS,,,  Endo/Other  negative endocrine ROSHypothyroidism    Renal/GU Renal diseasenegative Renal ROS  negative genitourinary   Musculoskeletal negative musculoskeletal ROS (+) Arthritis ,    Abdominal   Peds negative pediatric ROS (+)  Hematology negative hematology ROS (+) Blood dyscrasia, anemia   Anesthesia Other Findings Hip dislocation, bilateral (HCC) Asthma Edema CAD (coronary artery disease) CHF (congestive heart  failure) Anxiety Arrhythmia  Anemia Gout  Depression Osteoporosis  COPD (chronic obstructive pulmonary disease)  Sleep apnea  History of kidney stones Neuromuscular disorder  Dysrhythmia Hypothyroidism  Arthritis Substance abuse (HCC) History of anemia due to chronic kidney disease Chronic pain  Bipolar disorder (HCC) Chronic kidney disease, stage 4 (severe) Chronic systolic (congestive) heart failure  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   Epilepsy, unspecified, not intractable, without status epilepticus  Global hypokinesis of left ventricle Cardiac LV ejection fraction 30-35% Mild mitral regurgitation by prior echocardiogram  Alcohol abuse, in remission Atrial fibrillation      Reproductive/Obstetrics negative OB ROS                              Anesthesia Physical Anesthesia Plan  ASA: 4  Anesthesia Plan: MAC   Post-op Pain Management:    Induction: Intravenous  PONV Risk Score and Plan:   Airway Management Planned: Natural Airway and Nasal Cannula  Additional Equipment:   Intra-op Plan:   Post-operative Plan:   Informed Consent: I have reviewed the patients History and Physical, chart, labs and discussed the procedure including the risks, benefits and alternatives for the proposed anesthesia with the patient or authorized representative who has indicated his/her understanding and acceptance.     Dental Advisory Given  Plan Discussed with: Anesthesiologist, CRNA and Surgeon  Anesthesia Plan Comments: (Patient consented for risks of anesthesia including but not limited to:  - adverse reactions to medications - damage to eyes, teeth, lips or other oral mucosa - nerve damage due to positioning  - sore throat or hoarseness - Damage to  heart, brain, nerves, lungs, other parts of body or loss of life  Patient voiced understanding and assent.)         Anesthesia Quick  Evaluation

## 2024-05-23 NOTE — Discharge Instructions (Signed)

## 2024-05-27 ENCOUNTER — Observation Stay (HOSPITAL_BASED_OUTPATIENT_CLINIC_OR_DEPARTMENT_OTHER)
Admit: 2024-05-27 | Discharge: 2024-05-27 | Disposition: A | Discharge status: Home / Self Care | Attending: Hospitalist | Admitting: Hospitalist

## 2024-05-27 ENCOUNTER — Encounter
Admission: RE | Disposition: A | Discharge status: Home / Self Care | Payer: Self-pay | Source: Home / Self Care | Attending: Ophthalmology

## 2024-05-27 ENCOUNTER — Inpatient Hospital Stay
Admission: EM | Admit: 2024-05-27 | Discharge: 2024-06-06 | DRG: 308 | Disposition: A | Discharge status: Skilled Nursing Facility | Source: Ambulatory Visit | Attending: Obstetrics and Gynecology | Admitting: Obstetrics and Gynecology

## 2024-05-27 ENCOUNTER — Ambulatory Visit
Admission: RE | Admit: 2024-05-27 | Discharge: 2024-05-27 | Disposition: A | Discharge status: Home / Self Care | Attending: Ophthalmology | Admitting: Ophthalmology

## 2024-05-27 ENCOUNTER — Ambulatory Visit: Payer: Self-pay | Admitting: Anesthesiology

## 2024-05-27 ENCOUNTER — Other Ambulatory Visit: Payer: Self-pay

## 2024-05-27 DIAGNOSIS — I5023 Acute on chronic systolic (congestive) heart failure: Secondary | ICD-10-CM | POA: Diagnosis present

## 2024-05-27 DIAGNOSIS — I340 Nonrheumatic mitral (valve) insufficiency: Secondary | ICD-10-CM | POA: Diagnosis present

## 2024-05-27 DIAGNOSIS — I4891 Unspecified atrial fibrillation: Secondary | ICD-10-CM | POA: Diagnosis not present

## 2024-05-27 DIAGNOSIS — M109 Gout, unspecified: Secondary | ICD-10-CM | POA: Diagnosis present

## 2024-05-27 DIAGNOSIS — Z6841 Body Mass Index (BMI) 40.0 and over, adult: Secondary | ICD-10-CM

## 2024-05-27 DIAGNOSIS — Z599 Problem related to housing and economic circumstances, unspecified: Secondary | ICD-10-CM

## 2024-05-27 DIAGNOSIS — R4189 Other symptoms and signs involving cognitive functions and awareness: Secondary | ICD-10-CM | POA: Diagnosis present

## 2024-05-27 DIAGNOSIS — Z87442 Personal history of urinary calculi: Secondary | ICD-10-CM

## 2024-05-27 DIAGNOSIS — E669 Obesity, unspecified: Secondary | ICD-10-CM | POA: Diagnosis present

## 2024-05-27 DIAGNOSIS — T50916A Underdosing of multiple unspecified drugs, medicaments and biological substances, initial encounter: Secondary | ICD-10-CM | POA: Diagnosis present

## 2024-05-27 DIAGNOSIS — B348 Other viral infections of unspecified site: Secondary | ICD-10-CM | POA: Diagnosis present

## 2024-05-27 DIAGNOSIS — R2689 Other abnormalities of gait and mobility: Secondary | ICD-10-CM | POA: Diagnosis present

## 2024-05-27 DIAGNOSIS — I959 Hypotension, unspecified: Secondary | ICD-10-CM | POA: Diagnosis not present

## 2024-05-27 DIAGNOSIS — N184 Chronic kidney disease, stage 4 (severe): Secondary | ICD-10-CM | POA: Diagnosis present

## 2024-05-27 DIAGNOSIS — F1011 Alcohol abuse, in remission: Secondary | ICD-10-CM | POA: Diagnosis present

## 2024-05-27 DIAGNOSIS — M810 Age-related osteoporosis without current pathological fracture: Secondary | ICD-10-CM | POA: Diagnosis present

## 2024-05-27 DIAGNOSIS — R5381 Other malaise: Secondary | ICD-10-CM | POA: Diagnosis present

## 2024-05-27 DIAGNOSIS — J9621 Acute and chronic respiratory failure with hypoxia: Secondary | ICD-10-CM | POA: Diagnosis present

## 2024-05-27 DIAGNOSIS — I4819 Other persistent atrial fibrillation: Principal | ICD-10-CM | POA: Diagnosis present

## 2024-05-27 DIAGNOSIS — I5022 Chronic systolic (congestive) heart failure: Secondary | ICD-10-CM | POA: Diagnosis present

## 2024-05-27 DIAGNOSIS — Z885 Allergy status to narcotic agent status: Secondary | ICD-10-CM

## 2024-05-27 DIAGNOSIS — B974 Respiratory syncytial virus as the cause of diseases classified elsewhere: Secondary | ICD-10-CM | POA: Diagnosis present

## 2024-05-27 DIAGNOSIS — Z96643 Presence of artificial hip joint, bilateral: Secondary | ICD-10-CM | POA: Diagnosis present

## 2024-05-27 DIAGNOSIS — Z634 Disappearance and death of family member: Secondary | ICD-10-CM

## 2024-05-27 DIAGNOSIS — I5031 Acute diastolic (congestive) heart failure: Secondary | ICD-10-CM | POA: Diagnosis not present

## 2024-05-27 DIAGNOSIS — H547 Unspecified visual loss: Secondary | ICD-10-CM | POA: Diagnosis not present

## 2024-05-27 DIAGNOSIS — Z79899 Other long term (current) drug therapy: Secondary | ICD-10-CM

## 2024-05-27 DIAGNOSIS — Z602 Problems related to living alone: Secondary | ICD-10-CM | POA: Diagnosis present

## 2024-05-27 DIAGNOSIS — J441 Chronic obstructive pulmonary disease with (acute) exacerbation: Secondary | ICD-10-CM | POA: Diagnosis present

## 2024-05-27 DIAGNOSIS — I420 Dilated cardiomyopathy: Secondary | ICD-10-CM | POA: Diagnosis present

## 2024-05-27 DIAGNOSIS — J449 Chronic obstructive pulmonary disease, unspecified: Secondary | ICD-10-CM | POA: Diagnosis present

## 2024-05-27 DIAGNOSIS — Z808 Family history of malignant neoplasm of other organs or systems: Secondary | ICD-10-CM

## 2024-05-27 DIAGNOSIS — E875 Hyperkalemia: Secondary | ICD-10-CM | POA: Diagnosis not present

## 2024-05-27 DIAGNOSIS — I10 Essential (primary) hypertension: Secondary | ICD-10-CM | POA: Diagnosis present

## 2024-05-27 DIAGNOSIS — E039 Hypothyroidism, unspecified: Secondary | ICD-10-CM | POA: Diagnosis present

## 2024-05-27 DIAGNOSIS — F319 Bipolar disorder, unspecified: Secondary | ICD-10-CM | POA: Diagnosis present

## 2024-05-27 DIAGNOSIS — Z91148 Patient's other noncompliance with medication regimen for other reason: Secondary | ICD-10-CM

## 2024-05-27 DIAGNOSIS — I130 Hypertensive heart and chronic kidney disease with heart failure and stage 1 through stage 4 chronic kidney disease, or unspecified chronic kidney disease: Secondary | ICD-10-CM | POA: Diagnosis present

## 2024-05-27 DIAGNOSIS — H269 Unspecified cataract: Secondary | ICD-10-CM | POA: Diagnosis present

## 2024-05-27 DIAGNOSIS — G40909 Epilepsy, unspecified, not intractable, without status epilepticus: Secondary | ICD-10-CM | POA: Diagnosis present

## 2024-05-27 DIAGNOSIS — J9811 Atelectasis: Secondary | ICD-10-CM | POA: Diagnosis present

## 2024-05-27 DIAGNOSIS — F0393 Unspecified dementia, unspecified severity, with mood disturbance: Secondary | ICD-10-CM | POA: Diagnosis present

## 2024-05-27 DIAGNOSIS — Z87891 Personal history of nicotine dependence: Secondary | ICD-10-CM

## 2024-05-27 DIAGNOSIS — J209 Acute bronchitis, unspecified: Secondary | ICD-10-CM | POA: Diagnosis present

## 2024-05-27 DIAGNOSIS — D631 Anemia in chronic kidney disease: Secondary | ICD-10-CM | POA: Diagnosis present

## 2024-05-27 DIAGNOSIS — G4733 Obstructive sleep apnea (adult) (pediatric): Secondary | ICD-10-CM | POA: Diagnosis present

## 2024-05-27 DIAGNOSIS — Z888 Allergy status to other drugs, medicaments and biological substances status: Secondary | ICD-10-CM

## 2024-05-27 DIAGNOSIS — Z539 Procedure and treatment not carried out, unspecified reason: Secondary | ICD-10-CM | POA: Diagnosis present

## 2024-05-27 DIAGNOSIS — Z9049 Acquired absence of other specified parts of digestive tract: Secondary | ICD-10-CM

## 2024-05-27 DIAGNOSIS — Z91198 Patient's noncompliance with other medical treatment and regimen for other reason: Secondary | ICD-10-CM

## 2024-05-27 DIAGNOSIS — Z7901 Long term (current) use of anticoagulants: Secondary | ICD-10-CM

## 2024-05-27 DIAGNOSIS — J440 Chronic obstructive pulmonary disease with acute lower respiratory infection: Secondary | ICD-10-CM | POA: Diagnosis not present

## 2024-05-27 DIAGNOSIS — F0394 Unspecified dementia, unspecified severity, with anxiety: Secondary | ICD-10-CM | POA: Diagnosis present

## 2024-05-27 DIAGNOSIS — R0602 Shortness of breath: Secondary | ICD-10-CM | POA: Diagnosis present

## 2024-05-27 DIAGNOSIS — E1122 Type 2 diabetes mellitus with diabetic chronic kidney disease: Secondary | ICD-10-CM | POA: Diagnosis present

## 2024-05-27 DIAGNOSIS — F0390 Unspecified dementia without behavioral disturbance: Secondary | ICD-10-CM | POA: Diagnosis present

## 2024-05-27 DIAGNOSIS — R000 Tachycardia, unspecified: Secondary | ICD-10-CM | POA: Diagnosis present

## 2024-05-27 DIAGNOSIS — B9789 Other viral agents as the cause of diseases classified elsewhere: Secondary | ICD-10-CM | POA: Diagnosis present

## 2024-05-27 DIAGNOSIS — Z825 Family history of asthma and other chronic lower respiratory diseases: Secondary | ICD-10-CM

## 2024-05-27 DIAGNOSIS — Z79890 Hormone replacement therapy (postmenopausal): Secondary | ICD-10-CM

## 2024-05-27 DIAGNOSIS — Z993 Dependence on wheelchair: Secondary | ICD-10-CM

## 2024-05-27 DIAGNOSIS — I2510 Atherosclerotic heart disease of native coronary artery without angina pectoris: Secondary | ICD-10-CM | POA: Diagnosis present

## 2024-05-27 HISTORY — DX: Other ill-defined heart diseases: I51.89

## 2024-05-27 HISTORY — DX: 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: I69.359

## 2024-05-27 HISTORY — DX: Hypothyroidism, unspecified: E03.9

## 2024-05-27 HISTORY — DX: Other psychoactive substance abuse, uncomplicated: F19.10

## 2024-05-27 HISTORY — DX: Bipolar disorder, unspecified: F31.9

## 2024-05-27 HISTORY — DX: Nonrheumatic mitral (valve) insufficiency: I34.0

## 2024-05-27 HISTORY — DX: Unspecified atrial fibrillation: I48.91

## 2024-05-27 HISTORY — DX: Chronic kidney disease, unspecified: N18.9

## 2024-05-27 HISTORY — DX: Abnormal findings on diagnostic imaging of heart and coronary circulation: R93.1

## 2024-05-27 HISTORY — DX: Personal history of diseases of the blood and blood-forming organs and certain disorders involving the immune mechanism: Z86.2

## 2024-05-27 HISTORY — DX: Myoneural disorder, unspecified: G70.9

## 2024-05-27 HISTORY — DX: Epilepsy, unspecified, not intractable, without status epilepticus: G40.909

## 2024-05-27 HISTORY — DX: Alcohol abuse, in remission: F10.11

## 2024-05-27 HISTORY — DX: Chronic systolic (congestive) heart failure: I50.22

## 2024-05-27 HISTORY — DX: Cardiac arrhythmia, unspecified: I49.9

## 2024-05-27 HISTORY — DX: Unspecified osteoarthritis, unspecified site: M19.90

## 2024-05-27 HISTORY — DX: Other chronic pain: G89.29

## 2024-05-27 HISTORY — DX: Chronic kidney disease, stage 4 (severe): N18.4

## 2024-05-27 LAB — CBC WITH DIFFERENTIAL/PLATELET
Abs Immature Granulocytes: 0.03 K/uL (ref 0.00–0.07)
Basophils Absolute: 0 K/uL (ref 0.0–0.1)
Basophils Relative: 0 %
Eosinophils Absolute: 0.1 K/uL (ref 0.0–0.5)
Eosinophils Relative: 1 %
HCT: 39.6 % (ref 39.0–52.0)
Hemoglobin: 11.6 g/dL — ABNORMAL LOW (ref 13.0–17.0)
Immature Granulocytes: 0 %
Lymphocytes Relative: 11 %
Lymphs Abs: 1 K/uL (ref 0.7–4.0)
MCH: 27.4 pg (ref 26.0–34.0)
MCHC: 29.3 g/dL — ABNORMAL LOW (ref 30.0–36.0)
MCV: 93.4 fL (ref 80.0–100.0)
Monocytes Absolute: 0.7 K/uL (ref 0.1–1.0)
Monocytes Relative: 8 %
Neutro Abs: 7.1 K/uL (ref 1.7–7.7)
Neutrophils Relative %: 80 %
Platelets: 260 K/uL (ref 150–400)
RBC: 4.24 MIL/uL (ref 4.22–5.81)
RDW: 17.3 % — ABNORMAL HIGH (ref 11.5–15.5)
WBC: 9 K/uL (ref 4.0–10.5)
nRBC: 0 % (ref 0.0–0.2)

## 2024-05-27 LAB — GLUCOSE, CAPILLARY: Glucose-Capillary: 135 mg/dL — ABNORMAL HIGH (ref 70–99)

## 2024-05-27 LAB — TROPONIN I (HIGH SENSITIVITY): Troponin I (High Sensitivity): 8 ng/L (ref <18)

## 2024-05-27 LAB — BASIC METABOLIC PANEL WITH GFR
Anion gap: 8 (ref 5–15)
BUN: 36 mg/dL — ABNORMAL HIGH (ref 8–23)
CO2: 21 mmol/L — ABNORMAL LOW (ref 22–32)
Calcium: 9.2 mg/dL (ref 8.9–10.3)
Chloride: 107 mmol/L (ref 98–111)
Creatinine, Ser: 2.12 mg/dL — ABNORMAL HIGH (ref 0.61–1.24)
GFR, Estimated: 32 mL/min — ABNORMAL LOW (ref >60)
Glucose, Bld: 90 mg/dL (ref 70–99)
Potassium: 5 mmol/L (ref 3.5–5.1)
Sodium: 136 mmol/L (ref 135–145)

## 2024-05-27 LAB — MAGNESIUM: Magnesium: 2.2 mg/dL (ref 1.7–2.4)

## 2024-05-27 LAB — TSH: TSH: 1.377 u[IU]/mL (ref 0.350–4.500)

## 2024-05-27 SURGERY — PHACOEMULSIFICATION, CATARACT, WITH IOL INSERTION
Anesthesia: Monitor Anesthesia Care | Laterality: Left

## 2024-05-27 MED ORDER — LATANOPROST 0.005 % OP SOLN
1.0000 [drp] | Freq: Every day | OPHTHALMIC | Status: DC
  Administered 2024-05-27 – 2024-06-05 (×9): 1 [drp] via OPHTHALMIC
  Filled 2024-05-27 (×2): qty 2.5

## 2024-05-27 MED ORDER — ARMC OPHTHALMIC DILATING DROPS: 1.0000 | OPHTHALMIC | Status: DC | PRN

## 2024-05-27 MED ORDER — ACETAMINOPHEN 650 MG RE SUPP: 650.0000 mg | Freq: Four times a day (QID) | RECTAL | Status: DC | PRN

## 2024-05-27 MED ORDER — LACTATED RINGERS IV SOLN: INTRAVENOUS | Status: DC

## 2024-05-27 MED ORDER — FENTANYL CITRATE (PF) 100 MCG/2ML IJ SOLN
INTRAMUSCULAR | Status: AC
  Filled 2024-05-27: qty 2

## 2024-05-27 MED ORDER — LAMOTRIGINE 100 MG PO TABS
50.0000 mg | ORAL_TABLET | Freq: Every day | ORAL | Status: DC
  Administered 2024-05-28 – 2024-06-06 (×10): 50 mg via ORAL
  Filled 2024-05-27 (×9): qty 1
  Filled 2024-05-27: qty 2

## 2024-05-27 MED ORDER — LEVOTHYROXINE SODIUM 25 MCG PO TABS
25.0000 ug | ORAL_TABLET | Freq: Every day | ORAL | Status: DC
  Administered 2024-05-28 – 2024-06-06 (×10): 25 ug via ORAL
  Filled 2024-05-27 (×10): qty 1

## 2024-05-27 MED ORDER — TETRACAINE HCL 0.5 % OP SOLN
OPHTHALMIC | Status: AC
Start: 2024-05-27 — End: 2024-05-27
  Filled 2024-05-27: qty 4

## 2024-05-27 MED ORDER — OXYCODONE HCL 5 MG PO TABS
5.0000 mg | ORAL_TABLET | ORAL | Status: DC | PRN
  Administered 2024-05-28 – 2024-06-05 (×13): 5 mg via ORAL
  Filled 2024-05-27 (×14): qty 1

## 2024-05-27 MED ORDER — APIXABAN 5 MG PO TABS
5.0000 mg | ORAL_TABLET | Freq: Two times a day (BID) | ORAL | Status: DC
  Administered 2024-05-27 – 2024-06-06 (×20): 5 mg via ORAL
  Filled 2024-05-27 (×20): qty 1

## 2024-05-27 MED ORDER — FERROUS SULFATE 325 (65 FE) MG PO TABS
325.0000 mg | ORAL_TABLET | ORAL | Status: DC
  Administered 2024-05-28 – 2024-06-06 (×5): 325 mg via ORAL
  Filled 2024-05-27 (×6): qty 1

## 2024-05-27 MED ORDER — PRAZOSIN HCL 2 MG PO CAPS
2.0000 mg | ORAL_CAPSULE | Freq: Every day | ORAL | Status: DC
  Administered 2024-05-27 – 2024-06-05 (×10): 2 mg via ORAL
  Filled 2024-05-27 (×14): qty 1

## 2024-05-27 MED ORDER — ARMC OPHTHALMIC DILATING DROPS
OPHTHALMIC | Status: AC
  Filled 2024-05-27: qty 0.5

## 2024-05-27 MED ORDER — TRAZODONE HCL 50 MG PO TABS
75.0000 mg | ORAL_TABLET | Freq: Every day | ORAL | Status: DC
  Administered 2024-05-27 – 2024-06-05 (×10): 75 mg via ORAL
  Filled 2024-05-27 (×10): qty 2

## 2024-05-27 MED ORDER — ONDANSETRON HCL 4 MG/2ML IJ SOLN: 4.0000 mg | Freq: Four times a day (QID) | INTRAMUSCULAR | Status: DC | PRN

## 2024-05-27 MED ORDER — MIDAZOLAM HCL 2 MG/2ML IJ SOLN
INTRAMUSCULAR | Status: AC
  Filled 2024-05-27: qty 2

## 2024-05-27 MED ORDER — ONDANSETRON HCL 4 MG PO TABS
4.0000 mg | ORAL_TABLET | Freq: Four times a day (QID) | ORAL | Status: DC | PRN
  Administered 2024-05-28: 4 mg via ORAL
  Filled 2024-05-27: qty 1

## 2024-05-27 MED ORDER — TETRACAINE HCL 0.5 % OP SOLN: 1.0000 [drp] | OPHTHALMIC | Status: DC | PRN

## 2024-05-27 MED ORDER — CARVEDILOL 6.25 MG PO TABS
6.2500 mg | ORAL_TABLET | Freq: Two times a day (BID) | ORAL | Status: AC
Start: 2024-05-27 — End: ?
  Administered 2024-05-27 – 2024-05-31 (×8): 6.25 mg via ORAL
  Filled 2024-05-27 (×9): qty 1

## 2024-05-27 MED ORDER — DILTIAZEM HCL-DEXTROSE 125-5 MG/125ML-% IV SOLN (PREMIX)
5.0000 mg/h | INTRAVENOUS | Status: AC
  Administered 2024-05-27: 5 mg/h via INTRAVENOUS
  Filled 2024-05-27: qty 125

## 2024-05-27 MED ORDER — SERTRALINE HCL 50 MG PO TABS
25.0000 mg | ORAL_TABLET | Freq: Every day | ORAL | Status: DC
  Administered 2024-05-27 – 2024-06-06 (×11): 25 mg via ORAL
  Filled 2024-05-27 (×11): qty 1

## 2024-05-27 MED ORDER — BUSPIRONE HCL 10 MG PO TABS
15.0000 mg | ORAL_TABLET | Freq: Two times a day (BID) | ORAL | Status: DC
  Administered 2024-05-27 – 2024-06-06 (×20): 15 mg via ORAL
  Filled 2024-05-27 (×20): qty 2

## 2024-05-27 MED ORDER — CALCITRIOL 0.25 MCG PO CAPS
0.2500 ug | ORAL_CAPSULE | Freq: Every day | ORAL | Status: DC
Start: 2024-05-28 — End: 2024-06-07
  Administered 2024-05-28 – 2024-06-06 (×10): 0.25 ug via ORAL
  Filled 2024-05-27 (×10): qty 1

## 2024-05-27 MED ORDER — SERTRALINE HCL 50 MG PO TABS
100.0000 mg | ORAL_TABLET | Freq: Every day | ORAL | Status: DC
  Administered 2024-05-27 – 2024-06-06 (×11): 100 mg via ORAL
  Filled 2024-05-27 (×11): qty 2

## 2024-05-27 MED ORDER — IPRATROPIUM-ALBUTEROL 0.5-2.5 (3) MG/3ML IN SOLN
3.0000 mL | Freq: Four times a day (QID) | RESPIRATORY_TRACT | Status: DC | PRN
Start: 2024-05-27 — End: 2024-06-01
  Administered 2024-05-28 – 2024-06-01 (×4): 3 mL via RESPIRATORY_TRACT
  Filled 2024-05-27 (×4): qty 3

## 2024-05-27 MED ORDER — ACETAMINOPHEN 325 MG PO TABS
650.0000 mg | ORAL_TABLET | Freq: Four times a day (QID) | ORAL | Status: DC | PRN
  Administered 2024-05-28: 650 mg via ORAL
  Filled 2024-05-27: qty 2

## 2024-05-27 MED ORDER — FUROSEMIDE 20 MG PO TABS
20.0000 mg | ORAL_TABLET | Freq: Every day | ORAL | Status: DC
  Administered 2024-05-27 – 2024-05-29 (×3): 20 mg via ORAL
  Filled 2024-05-27 (×3): qty 1

## 2024-05-27 MED ORDER — POLYETHYLENE GLYCOL 3350 17 G PO PACK
17.0000 g | PACK | Freq: Every day | ORAL | Status: DC | PRN
Start: 2024-05-27 — End: 2024-05-29

## 2024-05-27 SURGICAL SUPPLY — 12 items
CANNULA ANT/CHMB 27G (MISCELLANEOUS) ×1 IMPLANT
CYSTOTOME ANGL RVRS SHRT 25G (CUTTER) ×1 IMPLANT
FEE CATARACT SUITE SIGHTPATH (MISCELLANEOUS) ×1 IMPLANT
GLOVE BIOGEL PI IND STRL 8 (GLOVE) ×1 IMPLANT
GLOVE SURG LX STRL 8.0 MICRO (GLOVE) ×1 IMPLANT
GLOVE SURG SYN 6.5 PF PI BL (GLOVE) ×1 IMPLANT
GLOVE SURG SYN 8.0 PF PI (GLOVE) IMPLANT
NEEDLE FILTER BLUNT 18X1 1/2 (NEEDLE) ×1 IMPLANT
PACK VIT ANT 23G (MISCELLANEOUS) IMPLANT
RING MALYGIN (MISCELLANEOUS) IMPLANT
SUTURE EHLN 10-0 CS-B-6CS-B-6 (SUTURE) IMPLANT
SYR 3ML LL SCALE MARK (SYRINGE) ×1 IMPLANT

## 2024-05-27 NOTE — Progress Notes (Signed)
 Patient stated preop that he has not been on eliquis  for awhile because I ran out of it.  He thinks he has been off Eliquis  maybe a month but was uncertain. Very pleasant man but not the best historian.

## 2024-05-27 NOTE — ED Provider Notes (Signed)
 [facility] Provider Note    Event Date/Time   First MD Initiated Contact with Patient 05/27/24 1037     (approximate)   History   Palpitations   HPI  Matthew [name] is a 73 y.o. male  who presents to the emergency department today from surgery center after being found to be in afib with rvr. The patient himself cannot give great history. Says he thinks he was brought here by ambulance. Is complaining of some pain across his upper chest. Per paperwork has history of atrial fibrillation and is on eliquis , although he is not sure if he is taking a blood thinner. Was given 10 mg diltiazem  by EMS.        Physical Exam   Triage Vital Signs: ED Triage Vitals  Encounter Vitals Group     BP 05/27/24 1037 135/87     Girls Systolic BP Percentile --      Girls Diastolic BP Percentile --      Boys Systolic BP Percentile --      Boys Diastolic BP Percentile --      Pulse Rate 05/27/24 1032 76     Resp 05/27/24 1032 19     Temp 05/27/24 1032 98.2 F (36.8 C)     Temp Source 05/27/24 1032 Oral     SpO2 05/27/24 1032 97 %     Weight 05/27/24 1035 (!) 320 lb (145.2 kg)     Height 05/27/24 1035 6' 2 (1.88 m)     Head Circumference --      Peak Flow --      Pain Score 05/27/24 1032 0     Pain Loc --      Pain Education --      Exclude from Growth Chart --     Most recent vital signs: Vitals:   05/27/24 1032 05/27/24 1037  BP:  135/87  Pulse: 76 71  Resp: 19 (!) 21  Temp: 98.2 F (36.8 C) 98.2 F (36.8 C)  SpO2: 97% 95%   General: Awake, alert, not completely oriented. CV:  Good peripheral perfusion. Irregular rate and rhythm. Resp:  Normal effort. Lungs clear. Abd:  No distention.    ED Results / Procedures / Treatments   Labs (all labs ordered are listed, but only abnormal results are displayed) Labs Reviewed  BASIC METABOLIC PANEL WITH GFR - Abnormal; Notable for the following components:      Result Value   CO2 21 (*)    BUN 36 (*)     Creatinine, Ser 2.12 (*)    GFR, Estimated 32 (*)    All other components within normal limits  CBC WITH DIFFERENTIAL/PLATELET - Abnormal; Notable for the following components:   Hemoglobin 11.6 (*)    MCHC 29.3 (*)    RDW 17.3 (*)    All other components within normal limits  MAGNESIUM   CBC WITH DIFFERENTIAL/PLATELET  TROPONIN I (HIGH SENSITIVITY)     EKG  I, Guadalupe Eagles, attending physician, personally viewed and interpreted this EKG  EKG Time: 1032 Rate: 122 Rhythm: atrial fibrillation Axis: normal Intervals: qtc 502 QRS: narrow ST changes: no st elevation Impression: abnormal ekg    RADIOLOGY None   PROCEDURES:  Critical Care performed: Yes  CRITICAL CARE Performed by: Guadalupe Eagles   Total critical care time: 30 minutes  Critical care time was exclusive of separately billable procedures and treating other patients.  Critical care was necessary to treat or prevent imminent or life-threatening  deterioration.  Critical care was time spent personally by me on the following activities: development of treatment plan with patient and/or surrogate as well as nursing, discussions with consultants, evaluation of patient's response to treatment, examination of patient, obtaining history from patient or surrogate, ordering and performing treatments and interventions, ordering and review of laboratory studies, ordering and review of radiographic studies, pulse oximetry and re-evaluation of patient's condition.   Procedures    MEDICATIONS ORDERED IN ED: Medications - No data to display   IMPRESSION / MDM / ASSESSMENT AND PLAN / ED COURSE  I reviewed the triage vital signs and the nursing notes.                              Differential diagnosis includes, but is not limited to, dehydration, infection, electrolyte abnormality  Patient's presentation is most consistent with acute presentation with potential threat to life or bodily function.   The patient  is on the cardiac monitor to evaluate for evidence of arrhythmia and/or significant heart rate changes.  Patient presented to the emergency department today via EMS because of concern for afib with rvr. Patient unable to give any significant history. EKG confirms afib with rvr. Given continued tachycardia will start diltiazem  infusion. Blood work without significant electrolyte abnormality or elevated troponin. Will plan on admission to the hospitalist service.       FINAL CLINICAL IMPRESSION(S) / ED DIAGNOSES   Final diagnoses:  Atrial fibrillation with RVR (HCC)       Note:  This document was prepared using Dragon voice recognition software and may include unintentional dictation errors.    Floy Roberts, MD 05/27/24 [phone]

## 2024-05-27 NOTE — ED Triage Notes (Signed)
 Pt to ED via [facility] from [facility]. Pt had cataract surgery scheduled for today. EMS called out for pt heart rate 130-160. Pt has hx Afib and on Eliquis . EMS administered 10 mg diltiazem . HR improved to 120-140.

## 2024-05-27 NOTE — Progress Notes (Addendum)
 On arrival, patient in atrial fib w/RVR, rate 145-160, BP initially 156/70, but then repeatedly 90s/60s.  Last BP 94/61. Pulse ox 95%. EKG atrial fib w/RVR rate 145, skin at present is warm and dry. Patient on cardiac monitor, with pulse oximetry, and close observation. Oxygen  per nasal cannula 2L/m/n/c  Patient is very pleasant but not the best historian. Has not been on Eliquis  for awhile; I ran out of it. Patient reports chest tightness and pressure at times, with dizziness on standing or moving from bed to chair, reports nausea in the last couple of days, reports short of breath sometimes, even at rest, and break a cold sweat sometimes at rest.  On oxygen  2L/m/n/c at home and at [facility].   Patient's issues appear to have occurred mostly in the last couple of days, per patient.  States, All my family got wiped out by heart problems except me. I'm the only one left.   Discussed with patient that he needs to be seen, and needs to have evaluation for his issues.  Patient agreed.  Patient is in [facility], and we no longer have the ability to draw and run stat labs here; labs would be done at [facility]. Patient to go per ambulance to [place]. Spoke on phone with charge nurse Jon at ER, and 911 called at [phone] for patient to go per ambulance. Has a caregiver with him.
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
## 2024-05-27 NOTE — Plan of Care (Signed)
  Problem: Education: Goal: Knowledge of General Education information will improve Description: Including pain rating scale, medication(s)/side effects and non-pharmacologic comfort measures 05/27/2024 1725 by Rollene Jon HERO, RN Outcome: Progressing 05/27/2024 1724 by Rollene Jon HERO, RN Outcome: Progressing   Problem: Health Behavior/Discharge Planning: Goal: Ability to manage health-related needs will improve 05/27/2024 1725 by Rollene Jon HERO, RN Outcome: Progressing 05/27/2024 1724 by Rollene Jon HERO, RN Outcome: Progressing   Problem: Clinical Measurements: Goal: Will remain free from infection 05/27/2024 1725 by Rollene Jon HERO, RN Outcome: Progressing 05/27/2024 1724 by Rollene Jon HERO, RN Outcome: Progressing   Problem: Clinical Measurements: Goal: Ability to maintain clinical measurements within normal limits will improve Outcome: Progressing

## 2024-05-27 NOTE — Anesthesia Preprocedure Evaluation (Signed)
 Anesthesia Evaluation    Airway Mallampati: III   Neck ROM: Full    Dental  (+) Missing, Poor Dentition, Chipped Chipped, Missing, Poor Dentition Missing all but 8 teeth, some chipped::  :   Pulmonary           Cardiovascular   10-24-21 echo 1. Left ventricular ejection fraction, by estimation, is 30 to 35%. The  left ventricle has moderate to severely decreased function. The left  ventricle demonstrates global hypokinesis.   2. Right ventricular systolic function is low normal. The right  ventricular size is mildly enlarged.   3. Left atrial size was moderately dilated. No left atrial/left atrial  appendage thrombus was detected.   4. Right atrial size was mildly dilated.   5. The mitral valve is normal in structure. Mild mitral valve  regurgitation.   6. Tricuspid valve regurgitation is mild to moderate.   7. The aortic valve is tricuspid. Aortic valve regurgitation is not  visualized.   Cataract surgery cancelled 05-27-24 due to atrial fib w/RVR rate 145-160s, has NOT been on Eliquis , I ran out of it,  BP 150s/70s to 94/61.  Dizziness, chest tightness, dyspnea, nausea, cold sweats in last 2 days, but poor historian. Went per ambulance to [facility].   Neuro/Psych    GI/Hepatic   Endo/Other    Renal/GU      Musculoskeletal   Abdominal   Peds  Hematology   Anesthesia Other Findings Did not have surgery 05-27-24 due to a fib w/RVR, transported via ambulance to [place]. Very pleasant man, poor historian. Has caretaker.  Dr. 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

## 2024-05-28 ENCOUNTER — Observation Stay

## 2024-05-28 DIAGNOSIS — R0602 Shortness of breath: Secondary | ICD-10-CM | POA: Diagnosis not present

## 2024-05-28 DIAGNOSIS — I428 Other cardiomyopathies: Secondary | ICD-10-CM

## 2024-05-28 DIAGNOSIS — I4891 Unspecified atrial fibrillation: Secondary | ICD-10-CM

## 2024-05-28 DIAGNOSIS — I5022 Chronic systolic (congestive) heart failure: Secondary | ICD-10-CM | POA: Diagnosis not present

## 2024-05-28 LAB — COMPREHENSIVE METABOLIC PANEL WITH GFR
ALT: 12 U/L (ref 0–44)
AST: 12 U/L — ABNORMAL LOW (ref 15–41)
Albumin: 2.7 g/dL — ABNORMAL LOW (ref 3.5–5.0)
Alkaline Phosphatase: 71 U/L (ref 38–126)
Anion gap: 6 (ref 5–15)
BUN: 42 mg/dL — ABNORMAL HIGH (ref 8–23)
CO2: 20 mmol/L — ABNORMAL LOW (ref 22–32)
Calcium: 9.3 mg/dL (ref 8.9–10.3)
Chloride: 111 mmol/L (ref 98–111)
Creatinine, Ser: 2.22 mg/dL — ABNORMAL HIGH (ref 0.61–1.24)
GFR, Estimated: 30 mL/min — ABNORMAL LOW (ref >60)
Glucose, Bld: 91 mg/dL (ref 70–99)
Potassium: 4.7 mmol/L (ref 3.5–5.1)
Sodium: 137 mmol/L (ref 135–145)
Total Bilirubin: 0.7 mg/dL (ref 0.0–1.2)
Total Protein: 6.2 g/dL — ABNORMAL LOW (ref 6.5–8.1)

## 2024-05-28 LAB — CBC
HCT: 37 % — ABNORMAL LOW (ref 39.0–52.0)
Hemoglobin: 11.2 g/dL — ABNORMAL LOW (ref 13.0–17.0)
MCH: 28 pg (ref 26.0–34.0)
MCHC: 30.3 g/dL (ref 30.0–36.0)
MCV: 92.5 fL (ref 80.0–100.0)
Platelets: 246 K/uL (ref 150–400)
RBC: 4 MIL/uL — ABNORMAL LOW (ref 4.22–5.81)
RDW: 17.3 % — ABNORMAL HIGH (ref 11.5–15.5)
WBC: 6.1 K/uL (ref 4.0–10.5)
nRBC: 0 % (ref 0.0–0.2)

## 2024-05-28 LAB — RESPIRATORY PANEL BY PCR

## 2024-05-28 LAB — ECHOCARDIOGRAM COMPLETE
Height: 74 in
S' Lateral: 4.4 cm
Weight: 5120 [oz_av]

## 2024-05-28 LAB — PROTIME-INR
INR: 1.3 — ABNORMAL HIGH (ref 0.8–1.2)
Prothrombin Time: 17.1 s — ABNORMAL HIGH (ref 11.4–15.2)

## 2024-05-28 LAB — PROCALCITONIN: Procalcitonin: 2.15 ng/mL

## 2024-05-28 LAB — BRAIN NATRIURETIC PEPTIDE: B Natriuretic Peptide: 158.5 pg/mL — ABNORMAL HIGH (ref 0.0–100.0)

## 2024-05-28 MED ORDER — ALUM & MAG HYDROXIDE-SIMETH 200-200-20 MG/5ML PO SUSP
30.0000 mL | Freq: Once | ORAL | Status: AC
  Administered 2024-05-28: 30 mL via ORAL
  Filled 2024-05-28: qty 30

## 2024-05-28 MED ORDER — DM-GUAIFENESIN ER 30-600 MG PO TB12
1.0000 | ORAL_TABLET | Freq: Two times a day (BID) | ORAL | Status: DC
  Administered 2024-05-28 – 2024-06-06 (×19): 1 via ORAL
  Filled 2024-05-28 (×22): qty 1
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
## 2024-05-28 NOTE — Progress Notes (Signed)
 Progress Note   Patient: Matthew [name] FMW:[id number] DOB: [birth year] DOA: 05/27/2024     0 DOS: the patient was seen and examined on 05/28/2024   Brief hospital course: H&P HPI  DREVON PLOG is a pleasant 73 y.o. male with medical history significant for A-fib on Eliquis , systolic chronic congestive heart failure on Lasix , CAD, bipolar disorder, depression, hypothyroidism, history of CVA who was sent over from eye surgery center after being found to be in A-fib with RVR.  Patient denies any symptoms.  Patient denies any chest pain, shortness of breath, palpitations.  He denies any nausea vomiting fever chills or any other sick feeling.   ED Course: Upon arrival to the ED, patient is found to be A-fib with RVR.  Patient was given 10 mg IV diltiazem  by EMS.  Patient was started on diltiazem  drip in the emergency room.  Hospitalist service was consulted for evaluation for admission. Assessment and Plan: Persistent Atrial fibrillation  Presented in afib with RVR. Initially on cardizem  infusion. Started on coreg 6.25 mg BID with improved rate control.  -Cardiology consulted will continue eliquis  and plan for routpatient follow up. -Consult TOC for assistance with medications   HFrEF NICM  Likely tachycardia mediated cardiomyopathy. EF 35 to 40-, global hypokinesis, mild MR. Appears compensated.  Continue home lasix  dose   Hypothyroidism  TSH WNL. Continue home synthroid   Normocytic anemia  Hgb stable.  Continue iron supplementation    Rhinovirus + Cxr with likely atelectasis, some fluid. Having some chest congestion.  Continue mucinex  DM.       Subjective: Having some chest congestion.   Physical Exam: Vitals:   05/28/24 1124 05/28/24 1148 05/28/24 1514 05/28/24 1556  BP: 106/68 108/74 103/82   Pulse: (!) 43  78   Resp: 20  20 (!) 24  Temp: 97.6 F (36.4 C) (!) 97.5 F (36.4 C) 97.9 F (36.6 C) 97.7 F (36.5 C)  TempSrc: Oral Oral Oral Oral  SpO2: 95%  92%   Weight:       Height:       Physical Exam  Constitutional: In no distress.  Cardiovascular: Normal rate, regular rhythm. No lower extremity edema  Pulmonary: Non labored breathing on Lydia, no wheezing or rales.   Abdominal: Soft. Non distended and non tender Musculoskeletal: Normal range of motion.     Neurological: Alert and oriented to person, place, and time. Non focal  Skin: Skin is warm and dry.   Data Reviewed:     Latest Ref Rng & Units 05/28/2024    4:39 AM 05/27/2024   10:41 AM 06/25/2022    5:12 PM  CBC  WBC 4.0 - 10.5 K/uL 6.1  9.0  6.4   Hemoglobin 13.0 - 17.0 g/dL 88.7  88.3  88.8   Hematocrit 39.0 - 52.0 % 37.0  39.6  37.6   Platelets 150 - 400 K/uL 246  260  256       Latest Ref Rng & Units 05/28/2024    4:39 AM 05/27/2024   11:41 AM 06/25/2022    5:12 PM  BMP  Glucose 70 - 99 mg/dL 91  90  882   BUN 8 - 23 mg/dL 42  36  27   Creatinine 0.61 - 1.24 mg/dL 7.77  7.87  7.77   Sodium 135 - 145 mmol/L 137  136  139   Potassium 3.5 - 5.1 mmol/L 4.7  5.0  4.6   Chloride 98 - 111 mmol/L 111  107  108   CO2 22 - 32 mmol/L 20  21  27    Calcium  8.9 - 10.3 mg/dL 9.3  9.2  9.4      Family Communication: None  Disposition: Status is: Observation The patient remains OBS appropriate and will d/c before 2 midnights.  Planned Discharge Destination: Home    Time spent: 35 minutes  Author: Alban Pepper, MD 05/28/2024 4:50 PM  For on call review [email or web address].

## 2024-05-28 NOTE — Care Management Obs Status (Signed)
 MEDICARE OBSERVATION STATUS NOTIFICATION   Patient Details  Name: Matthew [name] MRN: [id number] Date of Birth: Feb 24, 1951   Medicare Observation Status Notification Given:  Yes    [name] 05/28/2024, 9:57 AM

## 2024-05-28 NOTE — Plan of Care (Signed)
   Problem: Education: Goal: Knowledge of General Education information will improve Description Including pain rating scale, medication(s)/side effects and non-pharmacologic comfort measures Outcome: Progressing

## 2024-05-29 ENCOUNTER — Observation Stay

## 2024-05-29 DIAGNOSIS — E039 Hypothyroidism, unspecified: Secondary | ICD-10-CM | POA: Diagnosis present

## 2024-05-29 DIAGNOSIS — J9621 Acute and chronic respiratory failure with hypoxia: Secondary | ICD-10-CM | POA: Diagnosis present

## 2024-05-29 DIAGNOSIS — I428 Other cardiomyopathies: Secondary | ICD-10-CM | POA: Diagnosis not present

## 2024-05-29 DIAGNOSIS — R0602 Shortness of breath: Secondary | ICD-10-CM | POA: Diagnosis not present

## 2024-05-29 DIAGNOSIS — Z6841 Body Mass Index (BMI) 40.0 and over, adult: Secondary | ICD-10-CM | POA: Diagnosis not present

## 2024-05-29 DIAGNOSIS — D631 Anemia in chronic kidney disease: Secondary | ICD-10-CM | POA: Diagnosis present

## 2024-05-29 DIAGNOSIS — I509 Heart failure, unspecified: Secondary | ICD-10-CM | POA: Diagnosis not present

## 2024-05-29 DIAGNOSIS — E875 Hyperkalemia: Secondary | ICD-10-CM | POA: Diagnosis not present

## 2024-05-29 DIAGNOSIS — G4733 Obstructive sleep apnea (adult) (pediatric): Secondary | ICD-10-CM | POA: Diagnosis present

## 2024-05-29 DIAGNOSIS — J9811 Atelectasis: Secondary | ICD-10-CM | POA: Diagnosis present

## 2024-05-29 DIAGNOSIS — I4891 Unspecified atrial fibrillation: Secondary | ICD-10-CM | POA: Diagnosis present

## 2024-05-29 DIAGNOSIS — J440 Chronic obstructive pulmonary disease with acute lower respiratory infection: Secondary | ICD-10-CM | POA: Diagnosis not present

## 2024-05-29 DIAGNOSIS — J441 Chronic obstructive pulmonary disease with (acute) exacerbation: Secondary | ICD-10-CM | POA: Diagnosis present

## 2024-05-29 DIAGNOSIS — N184 Chronic kidney disease, stage 4 (severe): Secondary | ICD-10-CM

## 2024-05-29 DIAGNOSIS — I340 Nonrheumatic mitral (valve) insufficiency: Secondary | ICD-10-CM | POA: Diagnosis present

## 2024-05-29 DIAGNOSIS — I10 Essential (primary) hypertension: Secondary | ICD-10-CM | POA: Diagnosis not present

## 2024-05-29 DIAGNOSIS — J449 Chronic obstructive pulmonary disease, unspecified: Secondary | ICD-10-CM | POA: Diagnosis not present

## 2024-05-29 DIAGNOSIS — I5022 Chronic systolic (congestive) heart failure: Secondary | ICD-10-CM | POA: Diagnosis not present

## 2024-05-29 DIAGNOSIS — F319 Bipolar disorder, unspecified: Secondary | ICD-10-CM | POA: Diagnosis present

## 2024-05-29 DIAGNOSIS — I4819 Other persistent atrial fibrillation: Secondary | ICD-10-CM | POA: Diagnosis present

## 2024-05-29 DIAGNOSIS — F0393 Unspecified dementia, unspecified severity, with mood disturbance: Secondary | ICD-10-CM | POA: Diagnosis present

## 2024-05-29 DIAGNOSIS — F0394 Unspecified dementia, unspecified severity, with anxiety: Secondary | ICD-10-CM | POA: Diagnosis present

## 2024-05-29 DIAGNOSIS — J432 Centrilobular emphysema: Secondary | ICD-10-CM | POA: Diagnosis not present

## 2024-05-29 DIAGNOSIS — I4811 Longstanding persistent atrial fibrillation: Secondary | ICD-10-CM | POA: Diagnosis not present

## 2024-05-29 DIAGNOSIS — B348 Other viral infections of unspecified site: Secondary | ICD-10-CM | POA: Diagnosis not present

## 2024-05-29 DIAGNOSIS — B974 Respiratory syncytial virus as the cause of diseases classified elsewhere: Secondary | ICD-10-CM | POA: Diagnosis present

## 2024-05-29 DIAGNOSIS — I5023 Acute on chronic systolic (congestive) heart failure: Secondary | ICD-10-CM | POA: Diagnosis present

## 2024-05-29 DIAGNOSIS — I2510 Atherosclerotic heart disease of native coronary artery without angina pectoris: Secondary | ICD-10-CM | POA: Diagnosis present

## 2024-05-29 DIAGNOSIS — E1122 Type 2 diabetes mellitus with diabetic chronic kidney disease: Secondary | ICD-10-CM | POA: Diagnosis present

## 2024-05-29 DIAGNOSIS — I420 Dilated cardiomyopathy: Secondary | ICD-10-CM | POA: Diagnosis present

## 2024-05-29 DIAGNOSIS — J9601 Acute respiratory failure with hypoxia: Secondary | ICD-10-CM | POA: Diagnosis not present

## 2024-05-29 DIAGNOSIS — F1011 Alcohol abuse, in remission: Secondary | ICD-10-CM | POA: Diagnosis present

## 2024-05-29 DIAGNOSIS — I130 Hypertensive heart and chronic kidney disease with heart failure and stage 1 through stage 4 chronic kidney disease, or unspecified chronic kidney disease: Secondary | ICD-10-CM | POA: Diagnosis present

## 2024-05-29 DIAGNOSIS — G40909 Epilepsy, unspecified, not intractable, without status epilepticus: Secondary | ICD-10-CM | POA: Diagnosis present

## 2024-05-29 LAB — BASIC METABOLIC PANEL WITH GFR
Anion gap: 7 (ref 5–15)
BUN: 52 mg/dL — ABNORMAL HIGH (ref 8–23)
CO2: 23 mmol/L (ref 22–32)
Calcium: 9.5 mg/dL (ref 8.9–10.3)
Chloride: 109 mmol/L (ref 98–111)
Creatinine, Ser: 2.24 mg/dL — ABNORMAL HIGH (ref 0.61–1.24)
GFR, Estimated: 30 mL/min — ABNORMAL LOW (ref >60)
Glucose, Bld: 94 mg/dL (ref 70–99)
Potassium: 4.6 mmol/L (ref 3.5–5.1)
Sodium: 139 mmol/L (ref 135–145)

## 2024-05-29 LAB — BLOOD GAS, ARTERIAL
Acid-base deficit: 5.7 mmol/L — ABNORMAL HIGH (ref 0.0–2.0)
Bicarbonate: 21.5 mmol/L (ref 20.0–28.0)
O2 Saturation: 95.3 %
Patient temperature: 37
pCO2 arterial: 48 mmHg (ref 32–48)
pH, Arterial: 7.26 — ABNORMAL LOW (ref 7.35–7.45)
pO2, Arterial: 70 mmHg — ABNORMAL LOW (ref 83–108)

## 2024-05-29 LAB — FERRITIN: Ferritin: 79 ng/mL (ref 24–336)

## 2024-05-29 LAB — IRON AND TIBC
Iron: 31 ug/dL — ABNORMAL LOW (ref 45–182)
Saturation Ratios: 12 % — ABNORMAL LOW (ref 17.9–39.5)
TIBC: 255 ug/dL (ref 250–450)
UIBC: 224 ug/dL

## 2024-05-29 LAB — MAGNESIUM: Magnesium: 2.6 mg/dL — ABNORMAL HIGH (ref 1.7–2.4)

## 2024-05-29 LAB — BRAIN NATRIURETIC PEPTIDE: B Natriuretic Peptide: 287.6 pg/mL — ABNORMAL HIGH (ref 0.0–100.0)

## 2024-05-29 MED ORDER — METOPROLOL TARTRATE 5 MG/5ML IV SOLN
10.0000 mg | Freq: Once | INTRAVENOUS | Status: AC
  Administered 2024-05-29: 10 mg via INTRAVENOUS
  Filled 2024-05-29: qty 10

## 2024-05-29 MED ORDER — POLYETHYLENE GLYCOL 3350 17 G PO PACK
17.0000 g | PACK | Freq: Every day | ORAL | Status: DC
  Administered 2024-05-29 – 2024-06-06 (×9): 17 g via ORAL
  Filled 2024-05-29 (×9): qty 1

## 2024-05-29 MED ORDER — HYDROCOD POLI-CHLORPHE POLI ER 10-8 MG/5ML PO SUER
5.0000 mL | Freq: Every evening | ORAL | Status: DC | PRN
  Administered 2024-05-30 – 2024-06-02 (×2): 5 mL via ORAL
  Filled 2024-05-29 (×2): qty 5

## 2024-05-29 MED ORDER — FUROSEMIDE 10 MG/ML IJ SOLN
40.0000 mg | Freq: Once | INTRAMUSCULAR | Status: AC
  Administered 2024-05-29: 40 mg via INTRAVENOUS
  Filled 2024-05-29: qty 4

## 2024-05-29 NOTE — [place] FL2 (Signed)
 [place]  MEDICAID FL2 LEVEL OF CARE FORM     IDENTIFICATION  Patient Name: Matthew [name] Birthdate: 03/16/1951 Sex: male Admission Date (Current Location): 05/27/2024  [facility] and [place] Number:  Chiropodist and Address:  [facility], [address] [place], [place] [address]      Provider Number: [id number]  Attending Physician Name and Address:  Franchot Novel, MD  Relative Name and Phone Number:       Current Level of Care: Hospital Recommended Level of Care: Skilled Nursing Facility Prior Approval Number:    Date Approved/Denied:   PASRR Number: [id number]  Discharge Plan: SNF    Current Diagnoses: Patient Active Problem List   Diagnosis Date Noted   A-fib [facility]) 05/27/2024   Bipolar disorder (HCC) 05/27/2024   Hypothyroidism 05/27/2024   Self-care deficit in patient living alone 06/28/2022   Ambulatory dysfunction 06/25/2022   Osteoarthritis of left knee 06/25/2022   Physical deconditioning 06/25/2022   Chronic anticoagulation 06/25/2022   Chronic renal failure (CRF), stage 4 (severe) (HCC) 06/25/2022   Chronic fracture distal left femur 06/25/2022   Left knee pain 06/25/2022   Dementia without behavioral disturbance (HCC) 06/25/2022   Hyponatremia 05/12/2022   Left shoulder pain 05/10/2022   Malnutrition of moderate degree 05/05/2022   Acute pyelonephritis 05/03/2022   Chronic systolic CHF (congestive heart failure) (HCC) 05/03/2022   Persistent atrial fibrillation (HCC)    NICM (nonischemic cardiomyopathy) (HCC)    Rapid atrial fibrillation (HCC) 10/17/2021   Obesity, Class III, BMI 40-49.9 (morbid obesity) (HCC) 10/17/2021   OSA (obstructive sleep apnea) 10/17/2021   Anemia due to vitamin B12 deficiency    Delirium    Hyperkalemia    Heart failure with preserved ejection fraction (HCC)    CKD (chronic kidney disease) stage 4, GFR 15-29 ml/min (HCC)    Vitamin B12 deficiency    Depression    Atrial  fibrillation with rapid ventricular response (HCC) 07/11/2021   Anemia of chronic disease 07/11/2021   Asthma exacerbation 07/03/2021   Chest pain 07/03/2021   Acute gout    Right leg pain    Weakness    Shortness of breath 06/12/2021   Bilateral leg edema 06/12/2021   Atrial fibrillation with RVR (HCC) 06/12/2021   AKI (acute kidney injury) 06/12/2021   Elevated glucose level 06/12/2021   Obesity (BMI 30-39.9) 06/12/2021   Macrocytosis 06/12/2021   Acute on chronic respiratory failure with hypoxia (HCC) 06/12/2021   Chronic pain of left knee 12/16/2020   COPD (chronic obstructive pulmonary disease) (HCC) 12/04/2020   Acute right hip pain 12/04/2020   H/O bilateral hip replacements 12/04/2020   Stage 3b chronic kidney disease (HCC) 12/04/2020   Displaced comminuted fracture of shaft of left femur, initial encounter for closed fracture (HCC) 05/19/2020   Asthma 05/18/2020   Hypertension 05/18/2020   Femur fracture, left (HCC) 05/18/2020   Closed comminuted intra-articular fracture of distal femur (HCC) 05/17/2020   Moderate persistent asthma with exacerbation 10/15/2018    Orientation RESPIRATION BLADDER Height & Weight     Self, Time, Situation, Place  O2 (2 L nasal cannula) Continent Weight: (!) 320 lb (145.2 kg) Height:  6' 2 (188 cm)  BEHAVIORAL SYMPTOMS/MOOD NEUROLOGICAL BOWEL NUTRITION STATUS   (None)  (Dementia) Continent Diet (Heart healthy)  AMBULATORY STATUS COMMUNICATION OF NEEDS Skin   Extensive Assist Verbally Other (Comment) (Scratch marks.)  Personal Care Assistance Level of Assistance  Bathing, Feeding, Dressing Bathing Assistance: Maximum assistance Feeding assistance: Limited assistance Dressing Assistance: Maximum assistance     Functional Limitations Info  Sight, Hearing, Speech Sight Info: Adequate Hearing Info: Adequate Speech Info: Adequate    SPECIAL CARE FACTORS FREQUENCY  PT (By licensed PT), OT (By licensed OT)      PT Frequency: 5 x week OT Frequency: 5 x week            Contractures Contractures Info: Not present    Additional Factors Info  Code Status, Allergies, Isolation Precautions Code Status Info: Full code Allergies Info: Demerol (Meperidine Hcl), Lisinopril , Meperidine and related, Talwin (Pentazocine)     Isolation Precautions Info: Droplet precautions     Current Medications (05/29/2024):  This is the current hospital active medication list Current Facility-Administered Medications  Medication Dose Route Frequency Provider Last Rate Last Admin   acetaminophen  (TYLENOL ) tablet 650 mg  650 mg Oral Q6H PRN Paudel, Keshab, MD   650 mg at 05/28/24 1309   Or   acetaminophen  (TYLENOL ) suppository 650 mg  650 mg Rectal Q6H PRN Roann Gouty, MD       apixaban  (ELIQUIS ) tablet 5 mg  5 mg Oral BID Paudel, Keshab, MD   5 mg at 05/29/24 0902   busPIRone (BUSPAR) tablet 15 mg  15 mg Oral BID Paudel, Keshab, MD   15 mg at 05/29/24 9097   calcitRIOL (ROCALTROL) capsule 0.25 mcg  0.25 mcg Oral Daily Paudel, Keshab, MD   0.25 mcg at 05/29/24 0905   carvedilol (COREG) tablet 6.25 mg  6.25 mg Oral BID WC Paudel, Keshab, MD   6.25 mg at 05/29/24 0904   dextromethorphan-guaiFENesin  (MUCINEX  DM) 30-600 MG per 12 hr tablet 1 tablet  1 tablet Oral BID Franchot Novel, MD   1 tablet at 05/29/24 9093   ferrous sulfate tablet 325 mg  325 mg Oral Q M,W,F Paudel, Keshab, MD   325 mg at 05/28/24 0834   furosemide  (LASIX ) tablet 20 mg  20 mg Oral Daily Paudel, Gouty, MD   20 mg at 05/29/24 0902   ipratropium-albuterol  (DUONEB) 0.5-2.5 (3) MG/3ML nebulizer solution 3 mL  3 mL Nebulization Q6H PRN Roann Gouty, MD   3 mL at 05/28/24 1559   lamoTRIgine (LAMICTAL) tablet 50 mg  50 mg Oral Daily Paudel, Gouty, MD   50 mg at 05/29/24 0904   latanoprost (XALATAN) 0.005 % ophthalmic solution 1 drop  1 drop Both Eyes QHS Paudel, Keshab, MD   1 drop at 05/27/24 2145   levothyroxine (SYNTHROID) tablet 25 mcg   25 mcg Oral Daily Paudel, Keshab, MD   25 mcg at 05/29/24 0543   ondansetron  (ZOFRAN ) tablet 4 mg  4 mg Oral Q6H PRN Paudel, Keshab, MD   4 mg at 05/28/24 1558   Or   ondansetron  (ZOFRAN ) injection 4 mg  4 mg Intravenous Q6H PRN Roann Gouty, MD       oxyCODONE  (Oxy IR/ROXICODONE ) immediate release tablet 5 mg  5 mg Oral Q4H PRN Paudel, Keshab, MD   5 mg at 05/28/24 2211   polyethylene glycol (MIRALAX  / GLYCOLAX ) packet 17 g  17 g Oral Daily Franchot Novel, MD   17 g at 05/29/24 9076   prazosin (MINIPRESS) capsule 2 mg  2 mg Oral QHS Paudel, Keshab, MD   2 mg at 05/28/24 2216   sertraline (ZOLOFT) tablet 100 mg  100 mg Oral Daily Paudel, Gouty, MD   100 mg at  05/29/24 0902   sertraline (ZOLOFT) tablet 25 mg  25 mg Oral Daily Paudel, Keshab, MD   25 mg at 05/29/24 9096   traZODone  (DESYREL ) tablet 75 mg  75 mg Oral QHS Paudel, Keshab, MD   75 mg at 05/28/24 2211     Discharge Medications: Please see discharge summary for a list of discharge medications.  Relevant Imaging Results:  Relevant Lab Results:   Additional Information SS#: [id number]  Lauraine JAYSON Carpen, LCSW

## 2024-05-29 NOTE — Progress Notes (Signed)
   05/29/24 0842  Assess: MEWS Score  Temp 98.5 F (36.9 C)  BP (!) 134/54  MAP (mmHg) 75  Pulse Rate 74  ECG Heart Rate (!) 139  Resp 18  Level of Consciousness Alert  SpO2 92 %  O2 Device Nasal Cannula  O2 Flow Rate (L/min) 2 L/min  Assess: MEWS Score  MEWS Temp 0  MEWS Systolic 0  MEWS Pulse 3  MEWS RR 0  MEWS LOC 0  MEWS Score 3  MEWS Score Color Yellow  Assess: if the MEWS score is Yellow or Red  Were vital signs accurate and taken at a resting state? Yes  MEWS guidelines implemented  Yes, yellow  Treat  MEWS Interventions Considered administering scheduled or prn medications/treatments as ordered  Take Vital Signs  Increase Vital Sign Frequency  Yellow: Q2hr x1, continue Q4hrs until patient remains green for 12hrs  Escalate  MEWS: Escalate Yellow: Discuss with charge nurse and consider notifying provider and/or RRT  Notify: Charge Nurse/RN  Name of Charge Nurse/RN Notified Praweena  Assess: SIRS CRITERIA  SIRS Temperature  0  SIRS Respirations  0  SIRS Pulse 1  SIRS WBC 0  SIRS Score Sum  1

## 2024-05-29 NOTE — Progress Notes (Signed)
   05/29/24 1400  Assess: MEWS Score  BP 96/83  MAP (mmHg) 87  ECG Heart Rate (!) 101  Resp (!) 22  Level of Consciousness Alert  SpO2 96 %  O2 Device Nasal Cannula  O2 Flow Rate (L/min) 3 L/min  Assess: MEWS Score  MEWS Temp 0  MEWS Systolic 1  MEWS Pulse 1  MEWS RR 1  MEWS LOC 0  MEWS Score 3  MEWS Score Color Yellow  Assess: if the MEWS score is Yellow or Red  Were vital signs accurate and taken at a resting state? Yes  MEWS guidelines implemented  Yes, yellow  Treat  MEWS Interventions Considered administering scheduled or prn medications/treatments as ordered  Take Vital Signs  Increase Vital Sign Frequency  Yellow: Q2hr x1, continue Q4hrs until patient remains green for 12hrs  Escalate  MEWS: Escalate Yellow: Discuss with charge nurse and consider notifying provider and/or RRT  Notify: Charge Nurse/RN  Name of Charge Nurse/RN Notified Praweena  Assess: SIRS CRITERIA  SIRS Temperature  0  SIRS Respirations  1  SIRS Pulse 1  SIRS WBC 0  SIRS Score Sum  2

## 2024-05-29 NOTE — Progress Notes (Signed)
 Progress Note   Patient: Matthew [name] FMW:[id number] DOB: 08-18-1950 DOA: 05/27/2024     0 DOS: the patient was seen and examined on 05/29/2024   Brief hospital course: H&P HPI  Matthew [name] is a pleasant 73 y.o. male with medical history significant for A-fib on Eliquis , systolic chronic congestive heart failure on Lasix , CAD, bipolar disorder, depression, hypothyroidism, history of CVA who was sent over from eye surgery center after being found to be in A-fib with RVR.  Patient denies any symptoms.  Patient denies any chest pain, shortness of breath, palpitations.  He denies any nausea vomiting fever chills or any other sick feeling.   ED Course: Upon arrival to the ED, patient is found to be A-fib with RVR.  Patient was given 10 mg IV diltiazem  by EMS.  Patient was started on diltiazem  drip in the emergency room.  Hospitalist service was consulted for evaluation for admission. Assessment and Plan: Persistent Atrial fibrillation  Presented in afib with RVR. Initially on cardizem  infusion. Started on coreg 6.25 mg BID with improved rate control. Rates now worsened, possibly in setting of respiratory status. Per cardiology poor candidate currently for alternative therapy.  -Cardiology consulted will continue eliquis  and plan for routpatient follow up. -Will give 1x dose of IV lasix .   HFrEF NICM  Likely tachycardia mediated cardiomyopathy. EF 35 to 40-, global hypokinesis, mild MR.  BNP worsened, increased SOB.  -Will give 1x dose of IV lasix  40mg   -Monitor I&O  Hypothyroidism  TSH WNL. Continue home synthroid   Normocytic anemia  Hgb stable.  Continue iron supplementation    Rhinovirus + Cxr with likely atelectasis, some fluid. Having some chest congestion.  Continue mucinex  DM.  PRN nebs   OSA Nonadherent with CPAP Start CPAP this PM.      Subjective: Breathing is worse, states he feels like he has to really work to breathe.   Physical Exam: Vitals:   05/28/24 2129  05/29/24 0006 05/29/24 0444 05/29/24 0842  BP: 102/66 108/67 107/65 (!) 134/54  Pulse:    74  Resp:    18  Temp: 97.7 F (36.5 C) 97.6 F (36.4 C) 97.6 F (36.4 C) 98.5 F (36.9 C)  TempSrc: Oral Oral Oral Oral  SpO2:    92%  Weight:      Height:       Physical Exam  Constitutional: In no distress.  Cardiovascular: Irregularly irregular. No lower extremity edema  Pulmonary: Mildly increased WOB on , no wheezing or rales.   Abdominal: Soft. Non distended and non tender Musculoskeletal: Normal range of motion.     Neurological: Alert and oriented to person, place, and time. Non focal  Skin: Skin is warm and dry.    Data Reviewed:     Latest Ref Rng & Units 05/28/2024    4:39 AM 05/27/2024   10:41 AM 06/25/2022    5:12 PM  CBC  WBC 4.0 - 10.5 K/uL 6.1  9.0  6.4   Hemoglobin 13.0 - 17.0 g/dL 88.7  88.3  88.8   Hematocrit 39.0 - 52.0 % 37.0  39.6  37.6   Platelets 150 - 400 K/uL 246  260  256       Latest Ref Rng & Units 05/29/2024    6:06 AM 05/28/2024    4:39 AM 05/27/2024   11:41 AM  BMP  Glucose 70 - 99 mg/dL 94  91  90   BUN 8 - 23 mg/dL 52  42  36  Creatinine 0.61 - 1.24 mg/dL 7.75  7.77  7.87   Sodium 135 - 145 mmol/L 139  137  136   Potassium 3.5 - 5.1 mmol/L 4.6  4.7  5.0   Chloride 98 - 111 mmol/L 109  111  107   CO2 22 - 32 mmol/L 23  20  21    Calcium  8.9 - 10.3 mg/dL 9.5  9.3  9.2      Family Communication: None  Disposition: Status is: Inpatient afib RVR   Planned Discharge Destination: Home    Time spent: 35 minutes  Author: Alban Pepper, MD 05/29/2024 8:59 AM  For on call review [email or web address].

## 2024-05-29 NOTE — Discharge Instructions (Signed)
 Rent/Utility/Housing  Agency Name: [facility] Agency Address: [address] [place], [place] [address] Phone: [phone] Email: [email or web address] Website: [email or web address] Service(s) Offered: Housing services, self-sufficiency, congregate meal program, weatherization program, Field seismologist program, emergency food assistance,  housing counseling, home ownership program, wheels -towork program.  Agency Name: [facility] Address: [address] [place], [place] [address] Phone: [phone] (8a-4p) [phone] (8p- 10p) Email: [email or web address] Website: [email or web address] Service(s) Offered: A program for homeless and/or needy men that includes one-on-one counseling, life skills training and job rehabilitation.  Agency Name: [facility] Address: [address] [place], [place] [address] Phone: [phone] Website: www.alliedchurches.org Service(s) Offered: Assistance to needy in emergency with utility bills, heating fuel, and prescriptions. Shelter for homeless 7pm-7am. December 07, 2016 15  Agency Name: [facility] [place] [facility] Address: [address], [place], [place] [address] Phone: [phone] Contact Person: Cathleen Corti Email: [email or web address] Website: [email or web address] Service(s) Offered: Helps individuals with developmental disabilities move from housing that is more restrictive to homes where they  can achieve greater independence and have more  opportunities.  Agency Name: [facility] Address: [address], [place], [place] [address] Phone: [phone] Email: [email or web address] Website: www.[email or web address].org Service(s) Offered: Provides affordable housing for low-income families, elderly, and disabled individuals. Offer a wide range of  programs and services, from financial planning to  afterschool and summer programs.  Agency Name: [facility] Address: [address] [place], [place] [address] Phone: [phone] Service(s) Offered: Child support services; child welfare services; food stamps; Medicaid; work first family assistance; and aid with fuel,  rent, food and medicine.  Agency Name: [facility] [place] [employer] Address: [facility] [address], [place], [place]  [address] Phone: [phone] Website: www.familyabuseservices.org Service(s) Offered: 24 hour Crisis Line: [phone]; 24 hour Emergency Shelter; Transitional Housing; Support Groups; Scientist, physiological; [employer]; Hispanic Outreach: [phone];  Visitation Center: [phone].  Agency Name: [facility] [place]. Address: [address] [place], [place] [address] Phone: [phone] Service(s) Offered: CAP Services; Home and [employer]; Individual or Group Supports; Respite Care Non-Institutional Nursing;  Residential Supports; Respite Care and Personal Care Services; Transportation; Family and Friends Night; Recreational Activities; Three Nutritious Meals/Snacks; Consultation with Registered Dietician; Twenty-four hour Registered Nurse Access; Daily and [employer]; Camp [employer] Leaves; [place] for the [employer] (During Summer Months) Bingo Night (Every  Wednesday Night); Special Populations Dance Night  (Every Tuesday Night); Professional Hair Care Services.  Agency Name: [facility] Address: [address], [place], [place] [address] Phone: [phone] Contact Person: Jabier Mutton Website: [email or web address].Physicist, medical) Offered: Residential treatment facility for women; food and  clothing, educational & employment development and  transportation to work; Counsellor of financial skills;  parenting and family reunification; emotional and spiritual  support;  transitional housing for program graduates.  Agency Name: [facility] Address: [address] [place], [place] [address] Phone: [phone] Email: [email or web address] Website: [email or web address].es Service(s) Offered: Public housing units for elderly, disabled, and low income people; housing choice vouchers for income eligible  applicants; shelter plus care vouchers; and Psychologist, clinical.  Agency Name: [facility] [employer] Address: [address] [place], [place] [address] Phone: [phone] Email: [email or web address] Website: www.habitatalamance.org Service(s) Offered: Build houses for families in need of decent housing. Each adult in the family must invest 200 hours of labor on  someone else's house, work with volunteers to build their own house, attend classes  on budgeting, home maintenance, yard care, and attend homeowner association meetings.  Agency Name: [facility] Address: [address], [place], [place] [address] Phone: [phone] Website: www.rsli.org Service(s) Offered: Intermediate care facilities for intellectually delayed, Supervised Living in group homes for adults with developmental disabilities, Supervised Living for people who have dual diagnoses (MRMI), Independent Living, Supported Living, respite and a variety of CAP services, pre-vocational services, day supports, and [employer].  Agency Name: [place] [employer] Phone: [phone] Website: www.NCForeclosurePrevention.gov Service(s) Offered: Zero-interest, deferred loans to homeowners struggling to pay their mortgage. Call for more information.

## 2024-05-29 NOTE — Progress Notes (Signed)
   05/29/24 0915  Assess: MEWS Score  Pulse Rate (!) 152  ECG Heart Rate (!) 147  Resp (!) 22  SpO2 92 %  Assess: MEWS Score  MEWS Temp 0  MEWS Systolic 0  MEWS Pulse 3  MEWS RR 1  MEWS LOC 0  MEWS Score 4  MEWS Score Color Red  Assess: if the MEWS score is Yellow or Red  Were vital signs accurate and taken at a resting state? Yes  MEWS guidelines implemented  No, previously red, continue vital signs every 4 hours  Notify: Charge Nurse/RN  Name of Charge Nurse/RN Notified Praweena  Assess: SIRS CRITERIA  SIRS Temperature  0  SIRS Respirations  1  SIRS Pulse 1  SIRS WBC 0  SIRS Score Sum  2

## 2024-05-29 NOTE — Evaluation (Signed)
 Occupational Therapy Evaluation Patient Details Name: Matthew [name] MRN: [id number] DOB: 05/05/1951 Today's Date: 05/29/2024   History of Present Illness   Matthew [name] is a pleasant 73 y.o. male with medical history significant for A-fib on Eliquis , systolic chronic congestive heart failure on Lasix , CAD, bipolar disorder, depression, hypothyroidism, history of CVA who was sent over from eye surgery center after being found to be in A-fib with RVR.     Clinical Impressions Chart reviewed to date, pt greeted semi supine in bed, pt reporting he feels depressed. PTA he reports he is MOD I-I in ADL/IADL, lives alone as his wife recently passed away. Pt reports he feels fatigued. HR to 130s bpm with IS use, performs bed mobility with MOD I. Pt defers further edge of bed or out of bed tasks on this date due to fatigue. He has been out of the bed today with PT and required physical assist for transfer. Pt is performing ADL/functional mobility below PLOF, will benefit from acute OT to address functional deficits and to facilitate optimal ADL performance.      If plan is discharge home, recommend the following:   A lot of help with walking and/or transfers;A lot of help with bathing/dressing/bathroom;Supervision due to cognitive status     Functional Status Assessment   Patient has had a recent decline in their functional status and demonstrates the ability to make significant improvements in function in a reasonable and predictable amount of time.     Equipment Recommendations   Other (comment) (defer to next venue of care)     Recommendations for Other Services         Precautions/Restrictions   Precautions Precautions: Fall Recall of Precautions/Restrictions: Impaired Restrictions Weight Bearing Restrictions Per Provider Order: No     Mobility Bed Mobility Overal bed mobility: Modified Independent             General bed mobility comments: semi supine>sit- pt  gets halfway there and reports he does not feel like going farther    Transfers                   General transfer comment: pt deferred      Balance                                           ADL either performed or assessed with clinical judgement   ADL Overall ADL's : Needs assistance/impaired Eating/Feeding: Set up   Grooming: Set up                                       Vision Patient Visual Report: No change from baseline       Perception         Praxis         Pertinent Vitals/Pain Pain Assessment Pain Assessment: Faces Faces Pain Scale: Hurts whole lot Pain Location: generalized Pain Descriptors / Indicators: Discomfort Pain Intervention(s): Repositioned, Monitored during session     Extremity/Trunk Assessment Upper Extremity Assessment Upper Extremity Assessment: Generalized weakness   Lower Extremity Assessment Lower Extremity Assessment: Generalized weakness       Communication Communication Communication: No apparent difficulties   Cognition Arousal: Alert Behavior During Therapy: Flat affect Cognition: No family/caregiver present to determine baseline, Cognition impaired  Executive functioning impairment (select all impairments): Problem solving, Reasoning                   Following commands: Intact       Cueing  General Comments   Cueing Techniques: Verbal cues;Gestural cues  HR to 130s bpm with IS in bed   Exercises Other Exercises Other Exercises: edu re role of OT, role of rehab, discharge recommendations, importance of continued mobility attempts   Shoulder Instructions      Home Living Family/patient expects to be discharged to:: Private residence Living Arrangements: Alone   Type of Home: Mobile home Home Access: Stairs to enter Entrance Stairs-Number of Steps: 4 Entrance Stairs-Rails: Right;Left;Can reach both Home Layout: One level     Bathroom  Shower/Tub: Walk-in shower;Tub/shower unit   Bathroom Toilet: Standard     Home Equipment: Agricultural consultant (2 wheels);BSC/3in1;Wheelchair - manual;Cane - single point   Additional Comments: poor historian, will need to confirm      Prior Functioning/Environment Prior Level of Function : Patient poor historian/Family not available             Mobility Comments: pt reports amb with no AD ADLs Comments: pt reports MOD I-I with ADL/IADL, will need to confirm    OT Problem List: Decreased strength;Decreased activity tolerance;Impaired balance (sitting and/or standing);Decreased safety awareness;Decreased coordination   OT Treatment/Interventions: Self-care/ADL training;Balance training;Therapeutic exercise;Therapeutic activities;Energy conservation;DME and/or AE instruction;Patient/family education      OT Goals(Current goals can be found in the care plan section)   Acute Rehab OT Goals Patient Stated Goal: go home OT Goal Formulation: With patient Time For Goal Achievement: 06/12/24 Potential to Achieve Goals: Fair ADL Goals Pt Will Perform Grooming: with modified independence;sitting;standing Pt Will Perform Lower Body Dressing: with modified independence;sit to/from stand;sitting/lateral leans Pt Will Transfer to Toilet: with modified independence;ambulating Pt Will Perform Toileting - Clothing Manipulation and hygiene: with modified independence;sitting/lateral leans;sit to/from stand   OT Frequency:  Min 2X/week    Co-evaluation              AM-PAC OT 6 Clicks Daily Activity     Outcome Measure Help from another person eating meals?: None Help from another person taking care of personal grooming?: None Help from another person toileting, which includes using toliet, bedpan, or urinal?: A Little Help from another person bathing (including washing, rinsing, drying)?: A Little Help from another person to put on and taking off regular upper body clothing?:  None Help from another person to put on and taking off regular lower body clothing?: A Little 6 Click Score: 21   End of Session Equipment Utilized During Treatment: Oxygen  Nurse Communication: Mobility status  Activity Tolerance: Patient limited by fatigue Patient left: in bed;with call bell/phone within reach  OT Visit Diagnosis: Muscle weakness (generalized) (M62.81)                Time: 8587-8573 OT Time Calculation (min): 14 min Charges:  OT General Charges $OT Visit: 1 Visit OT Evaluation $OT Eval Moderate Complexity: 1 Mod Therisa Sheffield, OTD OTR/L  05/29/24, 4:08 PM

## 2024-05-29 NOTE — Progress Notes (Signed)
   05/29/24 1537  Assess: MEWS Score  ECG Heart Rate (!) 109  Resp 20  Assess: MEWS Score  MEWS Temp 0  MEWS Systolic 1  MEWS Pulse 1  MEWS RR 0  MEWS LOC 0  MEWS Score 2  MEWS Score Color Yellow  Assess: if the MEWS score is Yellow or Red  Were vital signs accurate and taken at a resting state? Yes  MEWS guidelines implemented  Yes, yellow  Treat  MEWS Interventions Considered administering scheduled or prn medications/treatments as ordered  Take Vital Signs  Increase Vital Sign Frequency  Yellow: Q2hr x1, continue Q4hrs until patient remains green for 12hrs  Escalate  MEWS: Escalate Yellow: Discuss with charge nurse and consider notifying provider and/or RRT  Notify: Charge Nurse/RN  Name of Charge Nurse/RN Notified Praweena  Assess: SIRS CRITERIA  SIRS Temperature  0  SIRS Respirations  0  SIRS Pulse 1  SIRS WBC 0  SIRS Score Sum  1

## 2024-05-29 NOTE — Evaluation (Signed)
 Physical Therapy Evaluation Patient Details Name: Matthew [name] MRN: [id number] DOB: May 05, 1951 Today's Date: 05/29/2024  History of Present Illness  Matthew [name] is a pleasant 73 y.o. male with medical history significant for A-fib on Eliquis , systolic chronic congestive heart failure on Lasix , CAD, bipolar disorder, depression, hypothyroidism, history of CVA who was sent over from eye surgery center after being found to be in A-fib with RVR.   Clinical Impression  Patient received in bed, he reports he is feeling badly. He is agreeable to PT assessment. Patient performed bed mobility with mod I. Increased time and effort needed due to sob, pain and fatigue. He has increased HR and low BP during session. Patient is able to squat pivot to recliner with min/mod A. He will continue to benefit from skilled PT to improve strength, endurance and safety with mobility.        If plan is discharge home, recommend the following: Two people to help with walking and/or transfers;A lot of help with bathing/dressing/bathroom   Can travel by private vehicle   No    Equipment Recommendations Other (comment) (TBD)  Recommendations for Other Services       Functional Status Assessment Patient has had a recent decline in their functional status and demonstrates the ability to make significant improvements in function in a reasonable and predictable amount of time.     Precautions / Restrictions Precautions Precautions: Fall Recall of Precautions/Restrictions: Impaired Restrictions Weight Bearing Restrictions Per Provider Order: No      Mobility  Bed Mobility Overal bed mobility: Modified Independent             General bed mobility comments: increased time needed, increased effort, use of bed controls (hob elevated, use of rails)    Transfers Overall transfer level: Needs assistance Equipment used: 1 person hand held assist Transfers: Sit to/from Stand, Bed to  chair/wheelchair/BSC Sit to Stand: Min assist     Squat pivot transfers: Mod assist, From elevated surface     General transfer comment: poor ability to step to recliner, more of a squat pivot to recliner    Ambulation/Gait               General Gait Details: unable at this time  Stairs            Wheelchair Mobility     Tilt Bed    Modified Rankin (Stroke Patients Only)       Balance Overall balance assessment: Needs assistance Sitting-balance support: Feet supported Sitting balance-Leahy Scale: Fair Sitting balance - Comments: leaning on bed, fatigued, reports mild dizziness, BP low Postural control: Right lateral lean Standing balance support: Bilateral upper extremity supported, During functional activity Standing balance-Leahy Scale: Poor                               Pertinent Vitals/Pain Pain Assessment Pain Assessment: Faces Faces Pain Scale: Hurts little more Pain Location: B LEs Pain Descriptors / Indicators: Discomfort, Grimacing Pain Intervention(s): Monitored during session, Repositioned    Home Living Family/patient expects to be discharged to:: Private residence Living Arrangements: Alone   Type of Home: Mobile home Home Access: Stairs to enter Entrance Stairs-Rails: Right;Left;Can reach both Entrance Stairs-Number of Steps: 4   Home Layout: One level   Additional Comments: states he has no equipment at home but is poor historian. Unsure if he is on home O2.    Prior Function Prior Level of  Function : Independent/Modified Independent;Driving             Mobility Comments: Patient reports he did not use AD prior to admission, was driving though he does not have a license. No family/friends at home ADLs Comments: independent     Extremity/Trunk Assessment   Upper Extremity Assessment Upper Extremity Assessment: Generalized weakness    Lower Extremity Assessment Lower Extremity Assessment: Generalized  weakness       Communication   Communication Communication: No apparent difficulties    Cognition Arousal: Alert Behavior During Therapy: WFL for tasks assessed/performed   PT - Cognitive impairments: Problem solving, Safety/Judgement, Awareness                       PT - Cognition Comments: poor historian Following commands: Intact       Cueing Cueing Techniques: Verbal cues, Gestural cues     General Comments      Exercises     Assessment/Plan    PT Assessment Patient needs continued PT services  PT Problem List Decreased strength;Decreased activity tolerance;Decreased balance;Decreased mobility;Decreased cognition;Decreased knowledge of use of DME;Decreased safety awareness;Cardiopulmonary status limiting activity;Obesity;Pain       PT Treatment Interventions DME instruction;Gait training;Stair training;Functional mobility training;Therapeutic activities;Therapeutic exercise;Balance training;Cognitive remediation;Patient/family education    PT Goals (Current goals can be found in the Care Plan section)  Acute Rehab PT Goals Patient Stated Goal: to improve PT Goal Formulation: With patient Time For Goal Achievement: 06/12/24 Potential to Achieve Goals: Fair    Frequency Min 2X/week     Co-evaluation               AM-PAC PT 6 Clicks Mobility  Outcome Measure Help needed turning from your back to your side while in a flat bed without using bedrails?: A Lot Help needed moving from lying on your back to sitting on the side of a flat bed without using bedrails?: A Lot Help needed moving to and from a bed to a chair (including a wheelchair)?: A Lot Help needed standing up from a chair using your arms (e.g., wheelchair or bedside chair)?: A Lot Help needed to walk in hospital room?: Total Help needed climbing 3-5 steps with a railing? : Total 6 Click Score: 10    End of Session Equipment Utilized During Treatment: Oxygen  Activity Tolerance:  Patient limited by lethargy;Patient limited by fatigue;Patient limited by pain Patient left: in chair;with call bell/phone within reach;with chair alarm set Nurse Communication: Mobility status PT Visit Diagnosis: Unsteadiness on feet (R26.81);Other abnormalities of gait and mobility (R26.89);Muscle weakness (generalized) (M62.81);Difficulty in walking, not elsewhere classified (R26.2);Pain Pain - Right/Left:  (B) Pain - part of body: Leg    Time: 8990-8972 PT Time Calculation (min) (ACUTE ONLY): 18 min   Charges:   PT Evaluation $PT Eval Moderate Complexity: 1 Mod   PT General Charges $$ ACUTE PT VISIT: 1 Visit        Kim Lauver, PT, GCS 05/29/24,10:40 AM

## 2024-05-29 NOTE — Progress Notes (Signed)
   05/29/24 1200  Assess: MEWS Score  BP 105/73  MAP (mmHg) 83  Pulse Rate (!) 111  ECG Heart Rate (!) 115  Resp 20  SpO2 95 %  O2 Device Nasal Cannula  O2 Flow Rate (L/min) 3 L/min  Assess: MEWS Score  MEWS Temp 0  MEWS Systolic 0  MEWS Pulse 2  MEWS RR 0  MEWS LOC 0  MEWS Score 2  MEWS Score Color Yellow  Assess: if the MEWS score is Yellow or Red  Were vital signs accurate and taken at a resting state? Yes  MEWS guidelines implemented  No, previously yellow, continue vital signs every 4 hours  Notify: Charge Nurse/RN  Name of Charge Nurse/RN Notified praweena  Assess: SIRS CRITERIA  SIRS Temperature  0  SIRS Respirations  0  SIRS Pulse 1  SIRS WBC 0  SIRS Score Sum  1

## 2024-05-29 NOTE — Progress Notes (Signed)
   05/29/24 0931  Assess: MEWS Score  Pulse Rate 100  ECG Heart Rate (!) 112  Resp 20  Level of Consciousness Alert  SpO2 94 %  O2 Device Nasal Cannula  O2 Flow Rate (L/min) 3 L/min  Assess: MEWS Score  MEWS Temp 0  MEWS Systolic 0  MEWS Pulse 2  MEWS RR 0  MEWS LOC 0  MEWS Score 2  MEWS Score Color Yellow  Assess: if the MEWS score is Yellow or Red  Were vital signs accurate and taken at a resting state? Yes  MEWS guidelines implemented  No, previously yellow, continue vital signs every 4 hours  Notify: Charge Nurse/RN  Name of Charge Nurse/RN Notified Praweena  Assess: SIRS CRITERIA  SIRS Temperature  0  SIRS Respirations  0  SIRS Pulse 1  SIRS WBC 0  SIRS Score Sum  1

## 2024-05-29 NOTE — Hospital Course (Signed)
 Wife passed per aptient. Has some cough. Not very productinve  Physical Exam  Constitutional: In no distress.  Cardiovascular: Irregularly irregular.  No lower extremity edema  Pulmonary: Non labored breathing on [place], no wheezing or rales.   Abdominal: Soft. Non distended and non tender Musculoskeletal: Normal range of motion.     Neurological: Alert and oriented to person, place, and time. Non focal  Skin: Skin is warm and dry.

## 2024-05-29 NOTE — Progress Notes (Signed)
   05/29/24 0905  Assess: MEWS Score  ECG Heart Rate (!) 150  Resp (!) 22  SpO2 92 %  Assess: MEWS Score  MEWS Temp 0  MEWS Systolic 0  MEWS Pulse 3  MEWS RR 1  MEWS LOC 0  MEWS Score 4  MEWS Score Color Red  Assess: if the MEWS score is Yellow or Red  Were vital signs accurate and taken at a resting state? Yes  MEWS guidelines implemented  Yes, red  Treat  MEWS Interventions Considered administering scheduled or prn medications/treatments as ordered  Take Vital Signs  Increase Vital Sign Frequency  Red: Q1hr x2, continue Q4hrs until patient remains green for 12hrs  Escalate  MEWS: Escalate Red: Discuss with charge nurse and notify provider. Consider notifying RRT. If remains red for 2 hours consider need for higher level of care  Notify: Charge Nurse/RN  Name of Charge Nurse/RN Notified Praweena  Assess: SIRS CRITERIA  SIRS Temperature  0  SIRS Respirations  1  SIRS Pulse 1  SIRS WBC 0  SIRS Score Sum  2

## 2024-05-29 NOTE — TOC Initial Note (Signed)
 Transition of Care [facility]) - Initial/Assessment Note    Patient Details  Name: Matthew [name] MRN: [id number] Date of Birth: 1950/10/12  [facility] CM/SW Contact:    Lauraine JAYSON Carpen, LCSW Phone Number: 05/29/2024, 1:08 PM  Clinical Narrative:  CSW met with patient. No family at bedside. CSW introduced role and explained that PT recommendations would be discussed. Patient will consider SNF placement. CSW will send out referral to determine what his options are. Patient said his wife passed away several weeks ago. CSW found a phone number for daughter Matthew in notes from 2023. Matthew [name]: [phone]. Patient gave permission to speak with her if needed. No further concerns. CSW will continue to follow patient for support and facilitate discharge to SNF, if agreeable, once medically stable.               Expected Discharge Plan: Skilled Nursing Facility Barriers to Discharge: Continued Medical Work up   Patient Goals and CMS Choice            Expected Discharge Plan and Services     Post Acute Care Choice: Skilled Nursing Facility Living arrangements for the past 2 months: Mobile Home                                      Prior Living Arrangements/Services Living arrangements for the past 2 months: Mobile Home Lives with:: Self Patient language and need for interpreter reviewed:: Yes Do you feel safe going back to the place where you live?: Yes      Need for Family Participation in Patient Care: Yes (Comment)     Criminal Activity/Legal Involvement Pertinent to Current Situation/Hospitalization: No - Comment as needed  Activities of Daily Living   ADL Screening (condition at time of admission) Independently performs ADLs?: Yes (appropriate for developmental age) Is the patient deaf or have difficulty hearing?: No Does the patient have difficulty seeing, even when wearing glasses/contacts?: No Does the patient have difficulty concentrating,  remembering, or making decisions?: No  Permission Sought/Granted Permission sought to share information with : Facility Industrial/product designer granted to share information with : Yes, Verbal Permission Granted     Permission granted to share info w AGENCY: SNF's        Emotional Assessment Appearance:: Appears stated age Attitude/Demeanor/Rapport: Engaged Affect (typically observed): Appropriate, Pleasant, Calm Orientation: : Oriented to Self, Oriented to Place, Oriented to  Time, Oriented to Situation Alcohol / Substance Use: Not Applicable Psych Involvement: No (comment)  Admission diagnosis:  A-fib (HCC) [I48.91] Rapid atrial fibrillation (HCC) [I48.91] Atrial fibrillation with RVR (HCC) [I48.91] Patient Active Problem List   Diagnosis Date Noted   A-fib (HCC) 05/27/2024   Bipolar disorder (HCC) 05/27/2024   Hypothyroidism 05/27/2024   Self-care deficit in patient living alone 06/28/2022   Ambulatory dysfunction 06/25/2022   Osteoarthritis of left knee 06/25/2022   Physical deconditioning 06/25/2022   Chronic anticoagulation 06/25/2022   Chronic renal failure (CRF), stage 4 (severe) (HCC) 06/25/2022   Chronic fracture distal left femur 06/25/2022   Left knee pain 06/25/2022   Dementia without behavioral disturbance (HCC) 06/25/2022   Hyponatremia 05/12/2022   Left shoulder pain 05/10/2022   Malnutrition of moderate degree 05/05/2022   Acute pyelonephritis 05/03/2022   Chronic systolic CHF (congestive heart failure) (HCC) 05/03/2022   Persistent atrial fibrillation (HCC)    NICM (nonischemic cardiomyopathy) (HCC)    Rapid atrial  fibrillation (HCC) 10/17/2021   Obesity, Class III, BMI 40-49.9 (morbid obesity) (HCC) 10/17/2021   OSA (obstructive sleep apnea) 10/17/2021   Anemia due to vitamin B12 deficiency    Delirium    Hyperkalemia    Heart failure with preserved ejection fraction (HCC)    CKD (chronic kidney disease) stage 4, GFR 15-29 ml/min (HCC)     Vitamin B12 deficiency    Depression    Atrial fibrillation with rapid ventricular response (HCC) 07/11/2021   Anemia of chronic disease 07/11/2021   Asthma exacerbation 07/03/2021   Chest pain 07/03/2021   Acute gout    Right leg pain    Weakness    Shortness of breath 06/12/2021   Bilateral leg edema 06/12/2021   Atrial fibrillation with RVR (HCC) 06/12/2021   AKI (acute kidney injury) 06/12/2021   Elevated glucose level 06/12/2021   Obesity (BMI 30-39.9) 06/12/2021   Macrocytosis 06/12/2021   Acute on chronic respiratory failure with hypoxia (HCC) 06/12/2021   Chronic pain of left knee 12/16/2020   COPD (chronic obstructive pulmonary disease) (HCC) 12/04/2020   Acute right hip pain 12/04/2020   H/O bilateral hip replacements 12/04/2020   Stage 3b chronic kidney disease (HCC) 12/04/2020   Displaced comminuted fracture of shaft of left femur, initial encounter for closed fracture (HCC) 05/19/2020   Asthma 05/18/2020   Hypertension 05/18/2020   Femur fracture, left (HCC) 05/18/2020   Closed comminuted intra-articular fracture of distal femur (HCC) 05/17/2020   Moderate persistent asthma with exacerbation 10/15/2018   PCP:  Britta King, MD Pharmacy:   [facility] [address] ([address]), [place] [address] [place]) [place] [address] Phone: [phone] Fax: [phone]     Social Drivers of Health (SDOH) Social History: SDOH Screenings   Food Insecurity: No Food Insecurity (05/27/2024)  Housing: High Risk (05/27/2024)  Transportation Needs: No Transportation Needs (05/27/2024)  Utilities: Not At Risk (05/27/2024)  Social Connections: Moderately Integrated (05/27/2024)  Tobacco Use: Low Risk  (05/27/2024)   SDOH Interventions:     Readmission Risk Interventions    05/04/2022   10:42 AM 10/18/2021   12:39 PM  Readmission Risk Prevention Plan  Transportation Screening  Complete  HRI or Home Care Consult Complete    Palliative Care Screening Complete   Medication Review (RN Care Manager) Complete Complete  PCP or Specialist appointment within 3-5 days of discharge  Complete  HRI or Home Care Consult  Complete  SW Recovery Care/Counseling Consult  Complete  Palliative Care Screening  Not Applicable  Skilled Nursing Facility  Not Applicable

## 2024-05-29 NOTE — Plan of Care (Signed)
   Problem: Education: Goal: Knowledge of General Education information will improve Description Including pain rating scale, medication(s)/side effects and non-pharmacologic comfort measures Outcome: Progressing

## 2024-05-29 NOTE — Progress Notes (Signed)
   05/29/24 0925  Assess: MEWS Score  Pulse Rate (!) 114  ECG Heart Rate (!) 114  Resp 20  SpO2 96 %  Assess: MEWS Score  MEWS Temp 0  MEWS Systolic 0  MEWS Pulse 2  MEWS RR 0  MEWS LOC 0  MEWS Score 2  MEWS Score Color Yellow  Assess: if the MEWS score is Yellow or Red  Were vital signs accurate and taken at a resting state? Yes  MEWS guidelines implemented  No, previously yellow, continue vital signs every 4 hours  Notify: Charge Nurse/RN  Name of Charge Nurse/RN Notified Praweena  Assess: SIRS CRITERIA  SIRS Temperature  0  SIRS Respirations  0  SIRS Pulse 1  SIRS WBC 0  SIRS Score Sum  1

## 2024-05-29 NOTE — Progress Notes (Signed)
   05/29/24 0900  Assess: MEWS Score  BP 128/77  MAP (mmHg) 91  Pulse Rate (!) 38  ECG Heart Rate (!) 142  Resp 20  SpO2 92 %  Assess: MEWS Score  MEWS Temp 0  MEWS Systolic 0  MEWS Pulse 3  MEWS RR 0  MEWS LOC 0  MEWS Score 3  MEWS Score Color Yellow  Assess: if the MEWS score is Yellow or Red  Were vital signs accurate and taken at a resting state? Yes  MEWS guidelines implemented  Yes, yellow  Treat  MEWS Interventions Considered administering scheduled or prn medications/treatments as ordered  Take Vital Signs  Increase Vital Sign Frequency  Yellow: Q2hr x1, continue Q4hrs until patient remains green for 12hrs  Escalate  MEWS: Escalate Yellow: Discuss with charge nurse and consider notifying provider and/or RRT  Notify: Charge Nurse/RN  Name of Charge Nurse/RN Notified Praweena  Assess: SIRS CRITERIA  SIRS Temperature  0  SIRS Respirations  0  SIRS Pulse 1  SIRS WBC 0  SIRS Score Sum  1

## 2024-05-29 NOTE — Plan of Care (Signed)

## 2024-05-29 NOTE — Progress Notes (Signed)
 Rounding Note   Patient Name: Matthew [name] Date of Encounter: 05/29/2024  [facility] Cardiologist: Lonni Hanson, MD   Subjective Worked with PT this morning, urinated in the bed Tachycardic Received metoprolol  IV push, carvedilol, subsequent hypotension Blood pressure improved sitting down in the chair still remained low At rest in the chair rate 110-120 A-fib Increased shortness of breath today he reports Significant leg weakness, gait instability PT recommending SNF   Scheduled Meds:  apixaban   5 mg Oral BID   busPIRone  15 mg Oral BID   calcitRIOL  0.25 mcg Oral Daily   carvedilol  6.25 mg Oral BID WC   dextromethorphan-guaiFENesin   1 tablet Oral BID   ferrous sulfate  325 mg Oral Q M,W,F   furosemide   20 mg Oral Daily   lamoTRIgine  50 mg Oral Daily   latanoprost  1 drop Both Eyes QHS   levothyroxine  25 mcg Oral Daily   polyethylene glycol  17 g Oral Daily   prazosin  2 mg Oral QHS   sertraline  100 mg Oral Daily   sertraline  25 mg Oral Daily   traZODone   75 mg Oral QHS   Continuous Infusions:  PRN Meds: acetaminophen  **OR** acetaminophen , ipratropium-albuterol , ondansetron  **OR** ondansetron  (ZOFRAN ) IV, oxyCODONE    Vital Signs  Vitals:   05/29/24 1024 05/29/24 1031 05/29/24 1100 05/29/24 1200  BP: 103/70  92/62 105/73  Pulse: (!) 51  (!) 32 (!) 111  Resp: (!) 23  (!) 27 20  Temp:      TempSrc:      SpO2: 91% 94% (!) 88% 95%  Weight:      Height:        Intake/Output Summary (Last 24 hours) at 05/29/2024 1303 Last data filed at 05/29/2024 1139 Gross per 24 hour  Intake 1080 ml  Output 1200 ml  Net -120 ml      05/27/2024   10:35 AM 05/23/2024   11:13 AM 07/09/2022    7:25 PM  Last 3 Weights  Weight (lbs) 320 lb 230 lb 249 lb 12.5 oz  Weight (kg) 145.151 kg 104.327 kg 113.3 kg      Telemetry Atrial fibrillation rate 110 up to 120- Personally Reviewed  ECG   - Personally Reviewed  Physical Exam  GEN: No acute  distress.   Neck: No JVD Cardiac: Irregularly irregular, no murmurs, rubs, or gallops.  Respiratory: Clear to auscultation bilaterally. GI: Soft, nontender, non-distended  MS: No edema; No deformity. Neuro:  Nonfocal  Psych: Normal affect   Labs High Sensitivity Troponin:   Recent Labs  Lab 05/27/24 1141  TROPONINIHS 8     Chemistry Recent Labs  Lab 05/27/24 1141 05/28/24 0439 05/29/24 0606  NA 136 137 139  K 5.0 4.7 4.6  CL 107 111 109  CO2 21* 20* 23  GLUCOSE 90 91 94  BUN 36* 42* 52*  CREATININE 2.12* 2.22* 2.24*  CALCIUM  9.2 9.3 9.5  MG 2.2  --  2.6*  PROT  --  6.2*  --   ALBUMIN  --  2.7*  --   AST  --  12*  --   ALT  --  12  --   ALKPHOS  --  71  --   BILITOT  --  0.7  --   GFRNONAA 32* 30* 30*  ANIONGAP 8 6 7     Lipids No results for input(s): CHOL, TRIG, HDL, LABVLDL, LDLCALC, CHOLHDL in the last 168 hours.  Hematology Recent  Labs  Lab 05/27/24 1041 05/28/24 0439  WBC 9.0 6.1  RBC 4.24 4.00*  HGB 11.6* 11.2*  HCT 39.6 37.0*  MCV 93.4 92.5  MCH 27.4 28.0  MCHC 29.3* 30.3  RDW 17.3* 17.3*  PLT 260 246   Thyroid   Recent Labs  Lab 05/27/24 1141  TSH 1.377    BNP Recent Labs  Lab 05/28/24 0435 05/29/24 1109  BNP 158.5* 287.6*    DDimer No results for input(s): DDIMER in the last 168 hours.   Radiology  DG Chest Port 1 View Result Date: 05/28/2024 CLINICAL DATA:  Acute hypoxemic respiratory failure EXAM: PORTABLE CHEST 1 VIEW COMPARISON:  Prior chest x-ray 05/03/2022 FINDINGS: Stable cardiomegaly and mediastinal contours. Slightly increased pulmonary vascular congestion without overt edema. Nonspecific streaky bibasilar airspace opacities favored to reflect atelectasis. No large effusion. No pneumothorax. No acute osseous abnormality. IMPRESSION: 1. Increased pulmonary vascular congestion without overt edema. 2. Streaky bibasilar airspace opacities may reflect atelectasis and/or infiltrate. Atelectasis is favored.  Electronically Signed   By: Wilkie Lent M.D.   On: 05/28/2024 13:00   ECHOCARDIOGRAM COMPLETE Result Date: 05/28/2024    ECHOCARDIOGRAM REPORT   Patient Name:   Matthew [name] Date of Exam: 05/27/2024 Medical Rec #:  [id number]      Height:       74.0 in Accession #:    [id number]     Weight:       320.0 lb Date of Birth:  [birth year]       BSA:          2.654 m Patient Age:    73 years       BP:           105/55 mmHg Patient Gender: M              HR:           81 bpm. Exam Location:  [facility] Procedure: 2D Echo, Cardiac Doppler and Color Doppler (Both Spectral and Color            Flow Doppler were utilized during procedure). Indications:     I50.31 Acute Diastolic CHF  History:         Patient has prior history of Echocardiogram examinations, most                  recent 10/18/2021. CHF, CAD, COPD, Arrythmias:Atrial                  Fibrillation, Signs/Symptoms:Edema; Risk Factors:Sleep Apnea.  Sonographer:     Carl Coma RDCS Referring Phys:  [id number] [facility] PAUDEL Diagnosing Phys: Evalene Lunger MD IMPRESSIONS  1. Left ventricular ejection fraction, by estimation, is 35 to 40%. The left ventricle has moderately decreased function. The left ventricle demonstrates global hypokinesis. Left ventricular diastolic parameters are indeterminate.  2. Right ventricular systolic function is normal. The right ventricular size is normal. There is normal pulmonary artery systolic pressure. The estimated right ventricular systolic pressure is 31.0 mmHg.  3. The mitral valve is normal in structure. Mild mitral valve regurgitation. No evidence of mitral stenosis.  4. The aortic valve is normal in structure. Aortic valve regurgitation is not visualized. No aortic stenosis is present.  5. The inferior vena cava is normal in size with greater than 50% respiratory variability, suggesting right atrial pressure of 3 mmHg. FINDINGS  Left Ventricle: Left ventricular ejection fraction, by estimation, is 35 to 40%. The left  ventricle has moderately decreased function. The left ventricle demonstrates  global hypokinesis. Strain was performed and the global longitudinal strain is indeterminate. The left ventricular internal cavity size was normal in size. There is no left ventricular hypertrophy. Left ventricular diastolic parameters are indeterminate. Right Ventricle: The right ventricular size is normal. No increase in right ventricular wall thickness. Right ventricular systolic function is normal. There is normal pulmonary artery systolic pressure. The tricuspid regurgitant velocity is 2.55 m/s, and  with an assumed right atrial pressure of 5 mmHg, the estimated right ventricular systolic pressure is 31.0 mmHg. Left Atrium: Left atrial size was normal in size. Right Atrium: Right atrial size was normal in size. Pericardium: There is no evidence of pericardial effusion. Mitral Valve: The mitral valve is normal in structure. Mild mitral valve regurgitation. No evidence of mitral valve stenosis. Tricuspid Valve: The tricuspid valve is normal in structure. Tricuspid valve regurgitation is not demonstrated. No evidence of tricuspid stenosis. Aortic Valve: The aortic valve is normal in structure. Aortic valve regurgitation is not visualized. No aortic stenosis is present. Pulmonic Valve: The pulmonic valve was normal in structure. Pulmonic valve regurgitation is not visualized. No evidence of pulmonic stenosis. Aorta: The aortic root is normal in size and structure. Venous: The inferior vena cava is normal in size with greater than 50% respiratory variability, suggesting right atrial pressure of 3 mmHg. IAS/Shunts: No atrial level shunt detected by color flow Doppler. Additional Comments: 3D was performed not requiring image post processing on an independent workstation and was indeterminate.  LEFT VENTRICLE PLAX 2D LVIDd:         5.90 cm LVIDs:         4.40 cm LV PW:         0.80 cm LV IVS:        0.80 cm LVOT diam:     1.90 cm LV SV:          47 LV SV Index:   18 LVOT Area:     2.84 cm  RIGHT VENTRICLE             IVC RV Basal diam:  4.10 cm     IVC diam: 2.50 cm RV S prime:     10.22 cm/s TAPSE (M-mode): 1.8 cm LEFT ATRIUM             Index        RIGHT ATRIUM           Index LA diam:        4.40 cm 1.66 cm/m   RA Area:     22.50 cm LA Vol (A2C):   87.0 ml 32.78 ml/m  RA Volume:   77.10 ml  29.05 ml/m LA Vol (A4C):   39.5 ml 14.88 ml/m LA Biplane Vol: 60.4 ml 22.76 ml/m  AORTIC VALVE LVOT Vmax:   93.05 cm/s LVOT Vmean:  57.150 cm/s LVOT VTI:    0.166 m  AORTA Ao Root diam: 3.80 cm Ao Asc diam:  4.20 cm MV E velocity: 93.07 cm/s  TRICUSPID VALVE                            TR Peak grad:   26.0 mmHg                            TR Vmax:        255.00 cm/s  SHUNTS                            Systemic VTI:  0.17 m                            Systemic Diam: 1.90 cm Evalene Lunger MD Electronically signed by Evalene Lunger MD Signature Date/Time: 05/28/2024/10:00:21 AM    Final     Cardiac Studies   Patient Profile   TAVIOUS GRIESINGER is a 73 y.o. male with a hx of atrial fibrillation, nonischemic cardiomyopathy, hypertension, obstructive lung disease with chronic respiratory failure with hypoxia, chronic kidney disease stage III, and obstructive sleep apnea not on CPAP, whom we have been asked to see due to atrial fibrillation with rapid ventricular response.   Assessment & Plan  Persistent atrial fibrillation Lost to follow-up since March 2023 hospitalization at which time he completed TEE cardioversion to restore normal sinus rhythm Presenting for cataract surgery May 27, 2024 with atrial fibrillation with RVR Medication noncompliance (out of his medications  -Started on carvedilol 6.25 twice daily with improved rate control yesterday, elevated rate today with hypotension -Continue Eliquis  5 twice daily -Not a ideal candidate for amiodarone  as he has not been anticoagulated, thyroid  disease -Dosing of  carvedilol limited secondary to hypotension - Not a good candidate for digoxin given renal dysfunction -Suspect respiratory status may be driving increased heart rate, more short of breath today   Cardiomyopathy/nonischemic Prior catheterization March 2020 with no significant coronary disease -Echo detailing ejection fraction 35 to 40% - Likely tachycardia mediated cardiomyopathy - Continue carvedilol 6.25 twice daily - Low blood pressure and renal failure limiting initiation of ACE /ARB/ARNI - Best option for his cardiomyopathy will be to restore normal sinus rhythm, currently not a good candidate for general anesthesia for TEE cardioversion given compromised respiratory status  Chronic respiratory stress Prior history of smoking Unable to exclude bronchitis In the setting of atrial fibrillation with RVR, likely component of pulmonary edema - On Lasix  20 daily - If respiratory status does not improve may need to give dose IV Lasix   Hypothyroidism On levothyroxine as outpatient   Anxiety/bipolar disorder On lamotrigine, buspirone   Chronic kidney disease stage IV Creatinine 2.2 dating back several years   For questions or updates, please contact [facility] Please consult [email or web address] for contact info under    Signed, Luane Rochon, MD  05/29/2024, 1:03 PM

## 2024-05-30 ENCOUNTER — Encounter: Payer: Self-pay | Admitting: Hospitalist

## 2024-05-30 DIAGNOSIS — I4811 Longstanding persistent atrial fibrillation: Secondary | ICD-10-CM

## 2024-05-30 DIAGNOSIS — I428 Other cardiomyopathies: Secondary | ICD-10-CM | POA: Diagnosis not present

## 2024-05-30 DIAGNOSIS — N184 Chronic kidney disease, stage 4 (severe): Secondary | ICD-10-CM | POA: Diagnosis not present

## 2024-05-30 DIAGNOSIS — J9601 Acute respiratory failure with hypoxia: Secondary | ICD-10-CM

## 2024-05-30 DIAGNOSIS — I4891 Unspecified atrial fibrillation: Secondary | ICD-10-CM | POA: Diagnosis not present

## 2024-05-30 DIAGNOSIS — I509 Heart failure, unspecified: Secondary | ICD-10-CM | POA: Diagnosis not present

## 2024-05-30 LAB — BASIC METABOLIC PANEL WITH GFR
Anion gap: 8 (ref 5–15)
BUN: 45 mg/dL — ABNORMAL HIGH (ref 8–23)
CO2: 23 mmol/L (ref 22–32)
Calcium: 9.9 mg/dL (ref 8.9–10.3)
Chloride: 106 mmol/L (ref 98–111)
Creatinine, Ser: 2.32 mg/dL — ABNORMAL HIGH (ref 0.61–1.24)
GFR, Estimated: 29 mL/min — ABNORMAL LOW (ref >60)
Glucose, Bld: 96 mg/dL (ref 70–99)
Potassium: 5.1 mmol/L (ref 3.5–5.1)
Sodium: 137 mmol/L (ref 135–145)

## 2024-05-30 LAB — MAGNESIUM: Magnesium: 2.4 mg/dL (ref 1.7–2.4)

## 2024-05-30 MED ORDER — POLYVINYL ALCOHOL 1.4 % OP SOLN
1.0000 [drp] | OPHTHALMIC | Status: DC | PRN
  Administered 2024-05-30 – 2024-06-03 (×3): 1 [drp] via OPHTHALMIC
  Filled 2024-05-30: qty 15

## 2024-05-30 MED ORDER — FUROSEMIDE 10 MG/ML IJ SOLN: 40.0000 mg | Freq: Once | INTRAMUSCULAR | Status: DC

## 2024-05-30 MED ORDER — FLUTICASONE FUROATE-VILANTEROL 200-25 MCG/ACT IN AEPB
1.0000 | INHALATION_SPRAY | Freq: Every day | RESPIRATORY_TRACT | Status: DC
  Administered 2024-05-30 – 2024-06-06 (×8): 1 via RESPIRATORY_TRACT
  Filled 2024-05-30: qty 28

## 2024-05-30 MED ORDER — FUROSEMIDE 10 MG/ML IJ SOLN
40.0000 mg | Freq: Once | INTRAMUSCULAR | Status: AC
  Administered 2024-05-30: 40 mg via INTRAVENOUS
  Filled 2024-05-30: qty 4

## 2024-05-30 NOTE — Progress Notes (Signed)
 Occupational Therapy Treatment Patient Details Name: DANE KOPKE MRN: [id number] DOB: 12-11-1950 Today's Date: 05/30/2024   History of present illness NITHIN DEMEO is a pleasant 73 y.o. male with medical history significant for A-fib on Eliquis , systolic chronic congestive heart failure on Lasix , CAD, bipolar disorder, depression, hypothyroidism, history of CVA who was sent over from eye surgery center after being found to be in A-fib with RVR.   OT comments  Pt making gradual progress towards goals. Pt agreeable to bed level therapeutic activity, politely deferring OOB transfers and mobility secondary to weakness/fatigue. OT educated pt on importance of performing activity as tolerated to prevent further deconditioning and potential use of Stedy to help with chair transfer, pt continues to decline and bed is placed in chair position. Pt participates in BLE exercises as described below to assist with AROM needed for functional transfers. Edu on use of IS, and encouraged pt to sit upright to assist with cardiopulmonary status with pt verbalizing understanding. OT will continue to follow acutely, discharge recommendation appropriate.       If plan is discharge home, recommend the following:  A lot of help with walking and/or transfers;A lot of help with bathing/dressing/bathroom;Supervision due to cognitive status   Equipment Recommendations  Other (comment)       Precautions / Restrictions Precautions Precautions: Fall Recall of Precautions/Restrictions: Impaired Restrictions Weight Bearing Restrictions Per Provider Order: No       Mobility Bed Mobility Overal bed mobility: Needs Assistance             General bed mobility comments: NT    Transfers Overall transfer level: Needs assistance                 General transfer comment: NT, pt deferred     Balance Overall balance assessment: Needs assistance     Sitting balance - Comments: NT                                    ADL either performed or assessed with clinical judgement   ADL Overall ADL's : Needs assistance/impaired                                       General ADL Comments: Pt politely declines all ADL performance this date. Offered bed level bathing, grooming and oral care with pt politely declining 2/2 fatigue    Extremity/Trunk Assessment                       Communication Communication Communication: No apparent difficulties   Cognition Arousal: Alert Behavior During Therapy: Flat affect Cognition: No family/caregiver present to determine baseline, Cognition impaired             OT - Cognition Comments: pt with depressed mood and flat affect                 Following commands: Intact        Cueing   Cueing Techniques: Verbal cues, Gestural cues  Exercises Exercises: General Lower Extremity, General Upper Extremity General Exercises - Lower Extremity Ankle Circles/Pumps: AROM, Both, 10 reps, Supine Heel Slides: AROM, Both, 10 reps, Supine Toe Raises: AROM, Both, Supine, 10 reps            Pertinent Vitals/ Pain  Pain Assessment Pain Assessment: Faces Faces Pain Scale: Hurts little more Pain Location: generalized Pain Descriptors / Indicators: Discomfort Pain Intervention(s): Limited activity within patient's tolerance, Monitored during session (pt refusing to be repositioned or to try OOB mobility to assist with comfort or pain reduction)   Frequency  Min 2X/week        Progress Toward Goals  OT Goals(current goals can now be found in the care plan section)  Progress towards OT goals: Progressing toward goals  Acute Rehab OT Goals OT Goal Formulation: With patient Time For Goal Achievement: 06/12/24 Potential to Achieve Goals: Fair ADL Goals Pt Will Perform Grooming: with modified independence;sitting;standing Pt Will Perform Lower Body Dressing: with modified independence;sit to/from  stand;sitting/lateral leans Pt Will Transfer to Toilet: with modified independence;ambulating Pt Will Perform Toileting - Clothing Manipulation and hygiene: with modified independence;sitting/lateral leans;sit to/from stand  Plan      Co-evaluation                 AM-PAC OT 6 Clicks Daily Activity     Outcome Measure   Help from another person eating meals?: None Help from another person taking care of personal grooming?: None Help from another person toileting, which includes using toliet, bedpan, or urinal?: A Little Help from another person bathing (including washing, rinsing, drying)?: A Little Help from another person to put on and taking off regular upper body clothing?: None Help from another person to put on and taking off regular lower body clothing?: A Little 6 Click Score: 21    End of Session Equipment Utilized During Treatment: Oxygen   OT Visit Diagnosis: Muscle weakness (generalized) (M62.81)   Activity Tolerance Patient limited by fatigue   Patient Left in bed;with call bell/phone within reach;with bed alarm set   Nurse Communication Mobility status        Time: 8862-8794 OT Time Calculation (min): 28 min  Charges: OT General Charges $OT Visit: 1 Visit OT Treatments $Therapeutic Activity: 23-37 mins  Amery Vandenbos L. Alleene Stoy, OTR/L  05/30/24, 2:18 PM

## 2024-05-30 NOTE — Progress Notes (Signed)
 Progress Note   Patient: Matthew [name] FMW:[id number] DOB: [birth year] DOA: 05/27/2024     1 DOS: the patient was seen and examined on 05/30/2024   Brief hospital course: H&P HPI  Matthew [name] is a pleasant 73 y.o. male with medical history significant for A-fib on Eliquis , systolic chronic congestive heart failure on Lasix , CAD, bipolar disorder, depression, hypothyroidism, history of CVA who was sent over from eye surgery center after being found to be in A-fib with RVR.  Patient denies any symptoms.  Patient denies any chest pain, shortness of breath, palpitations.  He denies any nausea vomiting fever chills or any other sick feeling.   ED Course: Upon arrival to the ED, patient is found to be A-fib with RVR.  Patient was given 10 mg IV diltiazem  by EMS.  Patient was started on diltiazem  drip in the emergency room.  Hospitalist service was consulted for evaluation for admission. Assessment and Plan: Persistent Atrial fibrillation  Presented in afib with RVR. Initially on cardizem  infusion. Started on coreg 6.25 mg BID with improved rate control. Rates are improved with IV diuresis.  Further titration of his medications limited by his blood pressures.  Per cardiology, due to medication nonadherence not appropriate for cardioversion - Will continue with Coreg for now  HFrEF NICM  Likely tachycardia mediated cardiomyopathy.  TTE EF 35 to 40-, global hypokinesis, mild MR.  Dyspnea improved with IV diuresis - IV diuresis -Monitor I&O and daily BMP  Anxiety depression Continue home psychotropic medications   Hypothyroidism  TSH WNL. Continue home synthroid   Normocytic anemia  Hgb stable.  Continue iron supplementation   CKD stage IV    Latest Ref Rng & Units 05/30/2024    4:15 AM 05/29/2024    6:06 AM 05/28/2024    4:39 AM  BMP  Glucose 70 - 99 mg/dL 96  94  91   BUN 8 - 23 mg/dL 45  52  42   Creatinine 0.61 - 1.24 mg/dL 7.67  7.75  7.77   Sodium 135 - 145 mmol/L 137  139   137   Potassium 3.5 - 5.1 mmol/L 5.1  4.6  4.7   Chloride 98 - 111 mmol/L 106  109  111   CO2 22 - 32 mmol/L 23  23  20    Calcium  8.9 - 10.3 mg/dL 9.9  9.5  9.3    Appears to be around baseline.  Continue to monitor daily with BMP.    COPD  PFTs not available.  Continue home inhaler, reportedly on wixela  PRN duonebs   Rhinovirus + Cxr with likely atelectasis, some fluid. Having some chest congestion.  Continue mucinex  DM.  PRN nebs   OSA Nonadherent with CPAP outpatient.  Start CPAP this PM while inaptient.   Reported h/o CVA CAD LHC 10/2018 with normal coronary arteries.   Obesity BMI 41  Complicates overall care  Cognitive impairment States he has trouble remembering things. He states that his wife is no longer living. The number listed for her in the chart is disconnected.        Subjective: Feels that his breathing is improved with IV diuresis.  Physical Exam: Vitals:   05/30/24 0833 05/30/24 1156 05/30/24 1700 05/30/24 1715  BP:  109/70 (!) 140/59   Pulse: (!) 113  98 100  Resp: 16  (!) 25 17  Temp:  98.5 F (36.9 C) 98.2 F (36.8 C)   TempSrc:  Oral Oral   SpO2: 94%  92%  96%  Weight:      Height:       Physical Exam  Constitutional: In no distress.  Cardiovascular: Irregularly irregular. No lower extremity edema  Pulmonary: Non labored breathing on nasal cannula, no wheezing or rales.   Abdominal: Soft. Non distended and non tender Musculoskeletal: Normal range of motion.     Neurological: Alert and oriented to person, place, and time. Non focal  Skin: Skin is warm and dry.     Data Reviewed:     Latest Ref Rng & Units 05/28/2024    4:39 AM 05/27/2024   10:41 AM 06/25/2022    5:12 PM  CBC  WBC 4.0 - 10.5 K/uL 6.1  9.0  6.4   Hemoglobin 13.0 - 17.0 g/dL 88.7  88.3  88.8   Hematocrit 39.0 - 52.0 % 37.0  39.6  37.6   Platelets 150 - 400 K/uL 246  260  256       Latest Ref Rng & Units 05/30/2024    4:15 AM 05/29/2024    6:06 AM  05/28/2024    4:39 AM  BMP  Glucose 70 - 99 mg/dL 96  94  91   BUN 8 - 23 mg/dL 45  52  42   Creatinine 0.61 - 1.24 mg/dL 7.67  7.75  7.77   Sodium 135 - 145 mmol/L 137  139  137   Potassium 3.5 - 5.1 mmol/L 5.1  4.6  4.7   Chloride 98 - 111 mmol/L 106  109  111   CO2 22 - 32 mmol/L 23  23  20    Calcium  8.9 - 10.3 mg/dL 9.9  9.5  9.3      Family Communication: None, attempted to reach person listed as daughter x2. Left voicemail.   Disposition: Status is: Inpatient afib RVR   Planned Discharge Destination: Home    Time spent: 35 minutes  Author: Alban Pepper, MD 05/30/2024 5:26 PM  For on call review [email or web address].

## 2024-05-30 NOTE — Plan of Care (Signed)
  Problem: Education: Goal: Knowledge of General Education information will improve Description: Including pain rating scale, medication(s)/side effects and non-pharmacologic comfort measures Outcome: Progressing   Problem: Elimination: Goal: Will not experience complications related to bowel motility Outcome: Progressing Goal: Will not experience complications related to urinary retention Outcome: Progressing   Problem: Safety: Goal: Ability to remain free from injury will improve Outcome: Progressing

## 2024-05-30 NOTE — Progress Notes (Signed)
 Rounding Note   Patient Name: Matthew [name] Date of Encounter: 05/30/2024  [facility] Cardiologist: Lonni Hanson, MD   Subjective Denies chest pain or palpitations.  Dyspnea improved.  Remains in A-fib with ventricular rates largely in the 90s to low 100s bpm with brief paroxysms into the 130s bpm.  Hemodynamically stable.   Scheduled Meds:  apixaban   5 mg Oral BID   busPIRone  15 mg Oral BID   calcitRIOL  0.25 mcg Oral Daily   carvedilol  6.25 mg Oral BID WC   dextromethorphan-guaiFENesin   1 tablet Oral BID   ferrous sulfate  325 mg Oral Q M,W,F   furosemide   40 mg Intravenous Once   lamoTRIgine  50 mg Oral Daily   latanoprost  1 drop Both Eyes QHS   levothyroxine  25 mcg Oral Daily   polyethylene glycol  17 g Oral Daily   prazosin  2 mg Oral QHS   sertraline  100 mg Oral Daily   sertraline  25 mg Oral Daily   traZODone   75 mg Oral QHS   Continuous Infusions:  PRN Meds: acetaminophen  **OR** acetaminophen , chlorpheniramine-HYDROcodone , ipratropium-albuterol , ondansetron  **OR** ondansetron  (ZOFRAN ) IV, oxyCODONE    Vital Signs  Vitals:   05/30/24 0453 05/30/24 0533 05/30/24 0538 05/30/24 0820  BP: (!) 168/138 90/62 112/73 92/65  Pulse: (!) 106 91  99  Resp: 20   18  Temp: 98.4 F (36.9 C) 98.4 F (36.9 C)  98.6 F (37 C)  TempSrc:  Oral    SpO2: 95% 96% 95% 96%  Weight:      Height:        Intake/Output Summary (Last 24 hours) at 05/30/2024 0821 Last data filed at 05/29/2024 2100 Gross per 24 hour  Intake 760 ml  Output 1050 ml  Net -290 ml      05/27/2024   10:35 AM 05/23/2024   11:13 AM 07/09/2022    7:25 PM  Last 3 Weights  Weight (lbs) 320 lb 230 lb 249 lb 12.5 oz  Weight (kg) 145.151 kg 104.327 kg 113.3 kg      Telemetry Atrial fibrillation with ventricular rates largely in the 90s to low 100s bpm with brief paroxysms into the 130s bpm - Personally Reviewed  ECG  No new tracings - Personally Reviewed  Physical Exam  GEN:  No acute distress.   Neck: JVD difficult to assess secondary to facial hair Cardiac: Irregularly irregular, no murmurs, rubs, or gallops.  Respiratory: Clear to auscultation bilaterally. GI: Soft, nontender, non-distended  MS: No edema; No deformity. Neuro:  Nonfocal  Psych: Normal affect   Labs High Sensitivity Troponin:   Recent Labs  Lab 05/27/24 1141  TROPONINIHS 8     Chemistry Recent Labs  Lab 05/27/24 1141 05/28/24 0439 05/29/24 0606 05/30/24 0415  NA 136 137 139 137  K 5.0 4.7 4.6 5.1  CL 107 111 109 106  CO2 21* 20* 23 23  GLUCOSE 90 91 94 96  BUN 36* 42* 52* 45*  CREATININE 2.12* 2.22* 2.24* 2.32*  CALCIUM  9.2 9.3 9.5 9.9  MG 2.2  --  2.6* 2.4  PROT  --  6.2*  --   --   ALBUMIN  --  2.7*  --   --   AST  --  12*  --   --   ALT  --  12  --   --   ALKPHOS  --  71  --   --   BILITOT  --  0.7  --   --   GFRNONAA 32* 30* 30* 29*  ANIONGAP 8 6 7 8     Lipids No results for input(s): CHOL, TRIG, HDL, LABVLDL, LDLCALC, CHOLHDL in the last 168 hours.  Hematology Recent Labs  Lab 05/27/24 1041 05/28/24 0439  WBC 9.0 6.1  RBC 4.24 4.00*  HGB 11.6* 11.2*  HCT 39.6 37.0*  MCV 93.4 92.5  MCH 27.4 28.0  MCHC 29.3* 30.3  RDW 17.3* 17.3*  PLT 260 246   Thyroid   Recent Labs  Lab 05/27/24 1141  TSH 1.377    BNP Recent Labs  Lab 05/28/24 0435 05/29/24 1109  BNP 158.5* 287.6*    DDimer No results for input(s): DDIMER in the last 168 hours.   Radiology  DG Chest Port 1 View Result Date: 05/29/2024 IMPRESSION: 1. Increasing bibasilar airspace opacities. Electronically signed by: Oneil Devonshire MD 05/29/2024 02:37 PM EDT RP Workstation: [id number]   DG Chest Port 1 View Result Date: 05/28/2024 IMPRESSION: 1. Increased pulmonary vascular congestion without overt edema. 2. Streaky bibasilar airspace opacities may reflect atelectasis and/or infiltrate. Atelectasis is favored. Electronically Signed   By: Wilkie Lent M.D.   On: 05/28/2024  13:00    Cardiac Studies  2D echo 05/27/2024: 1. Left ventricular ejection fraction, by estimation, is 35 to 40%. The  left ventricle has moderately decreased function. The left ventricle  demonstrates global hypokinesis. Left ventricular diastolic parameters are  indeterminate.   2. Right ventricular systolic function is normal. The right ventricular  size is normal. There is normal pulmonary artery systolic pressure. The  estimated right ventricular systolic pressure is 31.0 mmHg.   3. The mitral valve is normal in structure. Mild mitral valve  regurgitation. No evidence of mitral stenosis.   4. The aortic valve is normal in structure. Aortic valve regurgitation is  not visualized. No aortic stenosis is present.   5. The inferior vena cava is normal in size with greater than 50%  respiratory variability, suggesting right atrial pressure of 3 mmHg.    Patient Profile   Matthew [name] is a 73 y.o. male with a hx of atrial fibrillation, nonischemic cardiomyopathy, hypertension, obstructive lung disease with chronic respiratory failure with hypoxia, chronic kidney disease stage III, and obstructive sleep apnea not on CPAP, whom we have been asked to see due to atrial fibrillation with rapid ventricular response.   Assessment & Plan  Persistent atrial fibrillation - Lost to follow-up since 10/2021 hospitalization, at which time he completed TEE cardioversion to restore normal sinus rhythm - Presenting for cataract surgery May 27, 2024 with atrial fibrillation with RVR - Medication noncompliance (out of his medications) - Started on carvedilol 6.25 twice daily with improved rate control, which will be continued - Relative hypotension precludes escalation - Continue Eliquis  5 twice daily - Not a ideal candidate for amiodarone  as he has not been anticoagulated, and with underlying thyroid  disease - Not a good candidate for digoxin given renal dysfunction   2.  HFrEF secondary to  NICM - Prior catheterization in 10/2018 with no significant coronary disease - Echo detailing ejection fraction 35 to 40% - Likely tachycardia mediated cardiomyopathy - Continue carvedilol 6.25 twice daily - Low blood pressure and renal failure limiting initiation of ACEi/ARB/ARNI/MRA - Escalate GDMT as/if able - Best option for his cardiomyopathy will be to restore normal sinus rhythm, currently not a good candidate for general anesthesia for TEE cardioversion given compromised respiratory status  3.  Chronic respiratory distress - Prior history  of smoking - Unable to exclude bronchitis - In the setting of atrial fibrillation with RVR, likely component of pulmonary edema - Will give another one-time dose of IV Lasix  40 mg - Goal maintain slightly net negative volume status  4.  Hypothyroidism - On levothyroxine as outpatient   5.  Chronic kidney disease stage IV - Stable - Creatinine 2.2 dating back several years   For questions or updates, please contact [facility] Please consult [email or web address] for contact info under    Signed, Bernardino Bring, PA-C  05/30/2024, 8:21 AM

## 2024-05-30 NOTE — Progress Notes (Signed)
 Heart Failure Nurse Navigator Progress Note  PCP: Britta King, MD PCP-Cardiologist: Lonni Hanson, MD Admission Diagnosis: Atrial fibrillation with RVR [facility]) [facility]  Presentation:   Matthew [name] is a 73 y.o. male.He presented to the ED from a eye surgery center after being found in Atrial Fibrillation with RVR.  Was complaining of some pain across his upper chest. BP 135/87, Pulse 76, 97% O2 on room air. Patient has history significant for A-fib ,Systolic Chronic Congestive Heart Failure, CAD, Bipolar disorder, Depression, Hypothyroidism, and CVA. BNP 158.5 & 287.6. Chest x-ray increased pulmonary vascular congestion without overt edema and atelectasis.  ECHO/ LVEF: 35-40%  Clinical Course:  Past Medical History:  Diagnosis Date   Alcohol abuse, in remission    Anemia    Anxiety    Arrhythmia    atrial fibrillation   Arthritis    Asthma    Atrial fibrillation (HCC)    Bipolar disorder (HCC)    CAD (coronary artery disease)    Cardiac LV ejection fraction 30-35%    CHF (congestive heart failure) (HCC)    Chronic kidney disease, stage 4 (severe) (HCC)    Chronic pain    Chronic systolic (congestive) heart failure (HCC)    COPD (chronic obstructive pulmonary disease) (HCC)    Depression    Dysrhythmia    Afib   Edema    Epilepsy, unspecified, not intractable, without status epilepticus (HCC)    Global hypokinesis of left ventricle    Gout    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)    Hip dislocation, bilateral (HCC)    History of anemia due to chronic kidney disease    History of kidney stones    Hypothyroidism    Mild mitral regurgitation by prior echocardiogram    Neuromuscular disorder (HCC)    Osteoporosis    Sleep apnea    no cpap   Substance abuse (HCC)    ETOH abuse     Social History   Socioeconomic History   Marital status: Widowed    Spouse name: Not on file   Number of  children: 1   Years of education: Not on file   Highest education level: Not on file  Occupational History   Not on file  Tobacco Use   Smoking status: Never   Smokeless tobacco: Never  Vaping Use   Vaping status: Never Used  Substance and Sexual Activity   Alcohol use: Yes    Alcohol/week: 105.0 standard drinks of alcohol    Types: 105 Cans of beer per week    Comment: Drinks 15 beers a night in the bar.  Says he doesnt drink at home but goes to the bar everynight.   Drug use: Not Currently   Sexual activity: Not on file  Other Topics Concern   Not on file  Social History Narrative   Not on file   Social Drivers of Health   Financial Resource Strain: Not on file  Food Insecurity: No Food Insecurity (05/27/2024)   Hunger Vital Sign    Worried About Running Out of Food in the Last Year: Never true    Ran Out of Food in the Last Year: Never true  Transportation Needs: No Transportation Needs (05/27/2024)   PRAPARE - Administrator, Civil Service (Medical): No    Lack of Transportation (Non-Medical): No  Physical Activity: Not on file  Stress: Not on file  Social Connections: Moderately Integrated (05/27/2024)  Social Advertising account executive    Frequency of Communication with Friends and Family: Twice a week    Frequency of Social Gatherings with Friends and Family: Once a week    Attends Religious Services: More than 4 times per year    Active Member of [employer] or Organizations: Yes    Attends Banker Meetings: 1 to 4 times per year    Marital Status: Divorced   Education Assessment and Provision: Detailed education and instructions provided on heart failure disease management including the following:  Signs and symptoms of Heart Failure When to call the physician Importance of daily weights Low sodium diet Fluid restriction Medication management Anticipated future follow-up appointments  Patient education given on each of the above  topics.  Patient acknowledges understanding via teach back method and acceptance of all instructions.  Education Materials:  Living Better With Heart Failure Booklet, HF zone tool, & Daily Weight Tracker Tool.  Patient has scale at home: No. Will provide him with one for discharge. Patient has pill box at home: No will provide him with one for discharge.    High Risk Criteria for Readmission and/or Poor Patient Outcomes: Heart failure hospital admissions (last 6 months): 0  No Show rate: 9% Difficult social situation: Patients wife and dog passed away while he was in the hospital, Bipolar, ETOH abuse (15 beers a day) when he goes to the bar.  Demonstrates medication adherence: No.  Has not been taking any medications.  Patient went lost to cardiology in 2023.   Primary Language: English Literacy level: Reading, Writing and Comprehension  Barriers of Care:   Transportation-has no one to drive him to appointments now that his wife has passed.  Navigator provided him with transportation information to call and set up transportation to HF TOC appt. Consult placed for SW to contact patient for transportation needs.  Medication Non-compliance-patient lost contact with cardiology care in 2023. Shared that he does not have any current medications to take prior to this hospitalization. Alcohol abuse-15 beers a day at the bar.  Says he does not drink at home.  Depression-patient shared that his wife and dog passed since he has been in the hospital.   Considerations/Referrals:  Referral made to Heart Failure Pharmacist Stewardship: N/A Referral made to Heart Failure CSW/NCM TOC: No Referral made to Heart & Vascular TOC clinic: Yes   Items for Follow-up on DC/TOC: Daily Weights Diet & Fluid Restrictions Medication Compliance Continued Heart Failure Education  Charmaine Pines, RN, BSN [facility] Heart Failure Navigator Secure Chat Only

## 2024-05-30 NOTE — Progress Notes (Addendum)
 Patient will need transportation resources to scheduled Heart Failure TOC on 06/05/24 @ 2:30. Placed a SW consult for transportation and also provided him with a transportation resource handout with the number to try to call his insurance company to set up possible ride to HF TOC. Delivered a scale to patient for daily weights and a pill box for new medications for organizational purposes to assist with medication compliance.

## 2024-05-30 NOTE — Plan of Care (Signed)

## 2024-05-31 DIAGNOSIS — I4891 Unspecified atrial fibrillation: Secondary | ICD-10-CM | POA: Diagnosis not present

## 2024-05-31 DIAGNOSIS — B348 Other viral infections of unspecified site: Secondary | ICD-10-CM | POA: Diagnosis not present

## 2024-05-31 DIAGNOSIS — N184 Chronic kidney disease, stage 4 (severe): Secondary | ICD-10-CM | POA: Diagnosis not present

## 2024-05-31 DIAGNOSIS — I4811 Longstanding persistent atrial fibrillation: Secondary | ICD-10-CM | POA: Diagnosis not present

## 2024-05-31 DIAGNOSIS — I5022 Chronic systolic (congestive) heart failure: Secondary | ICD-10-CM | POA: Diagnosis not present

## 2024-05-31 LAB — BASIC METABOLIC PANEL WITH GFR
Anion gap: 4 — ABNORMAL LOW (ref 5–15)
BUN: 51 mg/dL — ABNORMAL HIGH (ref 8–23)
CO2: 28 mmol/L (ref 22–32)
Calcium: 9.4 mg/dL (ref 8.9–10.3)
Chloride: 103 mmol/L (ref 98–111)
Creatinine, Ser: 2.3 mg/dL — ABNORMAL HIGH (ref 0.61–1.24)
GFR, Estimated: 29 mL/min — ABNORMAL LOW (ref >60)
Glucose, Bld: 99 mg/dL (ref 70–99)
Potassium: 4.5 mmol/L (ref 3.5–5.1)
Sodium: 135 mmol/L (ref 135–145)

## 2024-05-31 LAB — BLOOD GAS, VENOUS
Acid-base deficit: 1.6 mmol/L (ref 0.0–2.0)
Bicarbonate: 25.6 mmol/L (ref 20.0–28.0)
O2 Saturation: 95.8 %
Patient temperature: 37
pCO2, Ven: 52 mmHg (ref 44–60)
pH, Ven: 7.3 (ref 7.25–7.43)
pO2, Ven: 65 mmHg — ABNORMAL HIGH (ref 32–45)

## 2024-05-31 LAB — MAGNESIUM: Magnesium: 2.4 mg/dL (ref 1.7–2.4)

## 2024-05-31 MED ORDER — METOPROLOL SUCCINATE ER 25 MG PO TB24
25.0000 mg | ORAL_TABLET | Freq: Every day | ORAL | Status: DC
Start: 2024-06-01 — End: 2024-06-02
  Administered 2024-06-01 – 2024-06-02 (×2): 25 mg via ORAL
  Filled 2024-05-31 (×2): qty 1

## 2024-05-31 NOTE — Progress Notes (Addendum)
 Progress Note   Patient: Matthew [name] FMW:[id number] DOB: 03-04-1951 DOA: 05/27/2024     2 DOS: the patient was seen and examined on 05/31/2024   Brief hospital course:  H&P HPI  SHISHIR KRANTZ is a pleasant 73 y.o. male with medical history significant for A-fib on Eliquis , systolic chronic congestive heart failure on Lasix , CAD, bipolar disorder, depression, hypothyroidism, history of CVA who was sent over from eye surgery center after being found to be in A-fib with RVR.  Patient denies any symptoms.  Patient denies any chest pain, shortness of breath, palpitations.  He denies any nausea vomiting fever chills or any other sick feeling.   ED Course: Upon arrival to the ED, patient is found to be A-fib with RVR.  Patient was given 10 mg IV diltiazem  by EMS.  Patient was started on diltiazem  drip in the emergency room.  Hospitalist service was consulted for evaluation for admission.      Assessment and Plan:  Persistent Atrial fibrillation  Presented in afib with RVR. Initially on cardizem  infusion. Started on coreg 6.25 mg BID with improved rate control. Rates are improved with IV diuresis.  Further titration of his medications limited by blood pressures.  Per cardiology, due to medication nonadherence not appropriate for cardioversion - Cardiology following - Continue with Coreg - Telemetry monitoring - Maintain K>4, Mg>2 - Cardioversion being considered for Monday 10/20 if respiratory status is improved   HFrEF NICM  Likely tachycardia mediated cardiomyopathy.  TTE EF 35 to 40-, global hypokinesis, mild MR.  Dyspnea improved with IV diuresis - Cardiology following - Holding IV Lasix  today -Monitor I&O and daily BMP  COPD  PFTs not available.  Continue home inhaler, reportedly on wixela  PRN duonebs    Rhinovirus + Cxr with likely atelectasis, some fluid. Having some chest congestion.  Continue mucinex  DM.  PRN nebs    Anxiety depression Continue home psychotropic  medications   Hypothyroidism  TSH WNL. Continue home synthroid    Normocytic anemia  Hgb stable.  Continue iron supplementation    CKD stage IV Appears to be around baseline.  Continue to monitor daily with BMP.    OSA Nonadherent with CPAP outpatient.  Start CPAP this PM while inaptient.    History of CVA CAD LHC 10/2018 with normal coronary arteries.    Morbid Obesity BMI 41  Complicates overall care   Cognitive impairment States he has trouble remembering things. He states that his wife is no longer living. The number listed for her in the chart is disconnected.         Subjective: Patient was awake resting in bed was seen on rounds this morning.  He reports his viral symptoms are improving.  Reports mild shortness of breath earlier this morning.  No other acute complaints at this time  Physical Exam: Vitals:   05/31/24 0416 05/31/24 0731 05/31/24 1144 05/31/24 1524  BP: 115/79 108/68  104/71  Pulse: 98 71 78 89  Resp: 20     Temp: 97.8 F (36.6 C) 97.7 F (36.5 C) 97.8 F (36.6 C) 97.7 F (36.5 C)  TempSrc:  Axillary Oral Oral  SpO2: 99% 100% 96% 93%  Weight:      Height:       General exam: awake, alert, no acute distress, chronically ill-appearing HEENT: moist mucus membranes, hearing grossly normal  Respiratory system: CTAB currently diminished, no wheezes, rales or rhonchi, normal respiratory effort. Cardiovascular system: normal S1/S2, RRR, no peripheral edema Gastrointestinal system:  soft, NT, ND, no HSM felt, +bowel sounds. Central nervous system: A&O x3. no gross focal neurologic deficits, normal speech Extremities: moves all , no edema, normal tone Skin: dry, intact, normal temperature Psychiatry: normal mood, congruent affect, judgement and insight appear normal   Data Reviewed:  Notable labs: BUN 51 Creatinine 2.30 Anion gap low 4  Family Communication: None present on rounds, will attempt to call as time allows  Disposition: Status  is: Inpatient Remains inpatient appropriate because: Severity of illness as above, needs ongoing close monitoring with transition off of IV diuretics and monitoring heart rates.  Not yet cleared by consultants for discharge    Planned Discharge Destination: Skilled nursing facility    Time spent: 45 minutes  Author: Burnard DELENA Cunning, DO 05/31/2024 4:19 PM  For on call review [email or web address].

## 2024-05-31 NOTE — Care Management Important Message (Signed)
 Important Message  Patient Details  Name: Matthew [name] MRN: [id number] Date of Birth: Aug 28, 1950   Important Message Given:  Yes - Medicare IM     [name] 05/31/2024, 2:07 PM

## 2024-05-31 NOTE — Progress Notes (Signed)
 Rounding Note   Patient Name: Matthew [name] Date of Encounter: 05/31/2024  [facility] Cardiologist: Lonni Hanson, MD   Subjective Denies chest pain or palpitations.  Dyspnea improved.  Remains in A-fib with ventricular rates largely in the 80s to 90s bpm.  Hemodynamically stable.   Scheduled Meds:  apixaban   5 mg Oral BID   busPIRone  15 mg Oral BID   calcitRIOL  0.25 mcg Oral Daily   carvedilol  6.25 mg Oral BID WC   dextromethorphan-guaiFENesin   1 tablet Oral BID   ferrous sulfate  325 mg Oral Q M,W,F   fluticasone furoate-vilanterol  1 puff Inhalation Daily   lamoTRIgine  50 mg Oral Daily   latanoprost  1 drop Both Eyes QHS   levothyroxine  25 mcg Oral Daily   polyethylene glycol  17 g Oral Daily   prazosin  2 mg Oral QHS   sertraline  100 mg Oral Daily   sertraline  25 mg Oral Daily   traZODone   75 mg Oral QHS   Continuous Infusions:  PRN Meds: acetaminophen  **OR** acetaminophen , artificial tears, chlorpheniramine-HYDROcodone , ipratropium-albuterol , ondansetron  **OR** ondansetron  (ZOFRAN ) IV, oxyCODONE    Vital Signs  Vitals:   05/30/24 2335 05/30/24 2357 05/31/24 0416 05/31/24 0731  BP:  102/66 115/79 108/68  Pulse: 93  98 71  Resp: 16  20   Temp: 98.5 F (36.9 C)  97.8 F (36.6 C) 97.7 F (36.5 C)  TempSrc:    Axillary  SpO2: 94%  99% 100%  Weight:      Height:        Intake/Output Summary (Last 24 hours) at 05/31/2024 0807 Last data filed at 05/31/2024 0300 Gross per 24 hour  Intake 360 ml  Output 600 ml  Net -240 ml      05/27/2024   10:35 AM 05/23/2024   11:13 AM 07/09/2022    7:25 PM  Last 3 Weights  Weight (lbs) 320 lb 230 lb 249 lb 12.5 oz  Weight (kg) 145.151 kg 104.327 kg 113.3 kg      Telemetry Atrial fibrillation with ventricular rates largely in the 80s to 90s bpm - Personally Reviewed  ECG  No new tracings - Personally Reviewed  Physical Exam  GEN: No acute distress.   Neck: JVD difficult to assess  secondary to facial hair Cardiac: Irregularly irregular, no murmurs, rubs, or gallops.  Respiratory: Clear to auscultation bilaterally. GI: Soft, nontender, non-distended  MS: No edema; No deformity. Neuro:  Nonfocal  Psych: Normal affect   Labs High Sensitivity Troponin:   Recent Labs  Lab 05/27/24 1141  TROPONINIHS 8     Chemistry Recent Labs  Lab 05/28/24 0439 05/29/24 0606 05/30/24 0415 05/31/24 0521  NA 137 139 137 135  K 4.7 4.6 5.1 4.5  CL 111 109 106 103  CO2 20* 23 23 28   GLUCOSE 91 94 96 99  BUN 42* 52* 45* 51*  CREATININE 2.22* 2.24* 2.32* 2.30*  CALCIUM  9.3 9.5 9.9 9.4  MG  --  2.6* 2.4 2.4  PROT 6.2*  --   --   --   ALBUMIN 2.7*  --   --   --   AST 12*  --   --   --   ALT 12  --   --   --   ALKPHOS 71  --   --   --   BILITOT 0.7  --   --   --   GFRNONAA 30* 30* 29* 29*  ANIONGAP  6 7 8  4*    Lipids No results for input(s): CHOL, TRIG, HDL, LABVLDL, LDLCALC, CHOLHDL in the last 168 hours.  Hematology Recent Labs  Lab 05/27/24 1041 05/28/24 0439  WBC 9.0 6.1  RBC 4.24 4.00*  HGB 11.6* 11.2*  HCT 39.6 37.0*  MCV 93.4 92.5  MCH 27.4 28.0  MCHC 29.3* 30.3  RDW 17.3* 17.3*  PLT 260 246   Thyroid   Recent Labs  Lab 05/27/24 1141  TSH 1.377    BNP Recent Labs  Lab 05/28/24 0435 05/29/24 1109  BNP 158.5* 287.6*    DDimer No results for input(s): DDIMER in the last 168 hours.   Radiology  DG Chest Port 1 View Result Date: 05/29/2024 IMPRESSION: 1. Increasing bibasilar airspace opacities. Electronically signed by: Oneil Devonshire MD 05/29/2024 02:37 PM EDT RP Workstation: [id number]   DG Chest Port 1 View Result Date: 05/28/2024 IMPRESSION: 1. Increased pulmonary vascular congestion without overt edema. 2. Streaky bibasilar airspace opacities may reflect atelectasis and/or infiltrate. Atelectasis is favored. Electronically Signed   By: Wilkie Lent M.D.   On: 05/28/2024 13:00    Cardiac Studies  2D echo 05/27/2024: 1.  Left ventricular ejection fraction, by estimation, is 35 to 40%. The  left ventricle has moderately decreased function. The left ventricle  demonstrates global hypokinesis. Left ventricular diastolic parameters are  indeterminate.   2. Right ventricular systolic function is normal. The right ventricular  size is normal. There is normal pulmonary artery systolic pressure. The  estimated right ventricular systolic pressure is 31.0 mmHg.   3. The mitral valve is normal in structure. Mild mitral valve  regurgitation. No evidence of mitral stenosis.   4. The aortic valve is normal in structure. Aortic valve regurgitation is  not visualized. No aortic stenosis is present.   5. The inferior vena cava is normal in size with greater than 50%  respiratory variability, suggesting right atrial pressure of 3 mmHg.    Patient Profile   Matthew [name] is a 73 y.o. male with a hx of atrial fibrillation, nonischemic cardiomyopathy, hypertension, obstructive lung disease with chronic respiratory failure with hypoxia, chronic kidney disease stage III, and obstructive sleep apnea not on CPAP, whom we have been asked to see due to atrial fibrillation with rapid ventricular response.   Assessment & Plan  Persistent atrial fibrillation - Lost to follow-up since 10/2021 hospitalization, at which time he completed TEE cardioversion to restore normal sinus rhythm - Presenting for cataract surgery May 27, 2024 with atrial fibrillation with RVR - Medication noncompliance (out of his medications) - Started on carvedilol 6.25 twice daily with improved rate control, which will be continued - Relative hypotension precludes escalation - Continue Eliquis  5 twice daily - Not a ideal candidate for amiodarone  as he has not been anticoagulated, and with underlying thyroid  disease - Not a good candidate for digoxin given renal dysfunction   2.  HFrEF secondary to NICM - Prior catheterization in 10/2018 with no significant  coronary disease - Echo detailing ejection fraction 35 to 40% - Likely tachycardia mediated cardiomyopathy - Continue carvedilol 6.25 twice daily - Low blood pressure and renal failure limiting initiation of ACEi/ARB/ARNI/MRA - Escalate GDMT as/if able - Hold IV Lasix  for today - Best option for his cardiomyopathy will be to restore normal sinus rhythm, currently not a good candidate for general anesthesia for TEE cardioversion given compromised respiratory status  3.  Chronic respiratory distress - Prior history of smoking - Unable to exclude bronchitis - In  the setting of atrial fibrillation with RVR, likely component of pulmonary edema - Goal maintain slightly net negative volume status  4.  Hypothyroidism - On levothyroxine as outpatient   5.  Chronic kidney disease stage IV - Stable - Creatinine 2.2 dating back several years   For questions or updates, please contact [facility] Please consult [email or web address] for contact info under    Signed, Bernardino Bring, PA-C  05/31/2024, 8:07 AM

## 2024-05-31 NOTE — Progress Notes (Deleted)
 Pt transferred to ICU at 11:20 following a RRT for symptomatic bradycardia.  Report given to Harlene, [place]

## 2024-06-01 DIAGNOSIS — I5022 Chronic systolic (congestive) heart failure: Secondary | ICD-10-CM | POA: Diagnosis not present

## 2024-06-01 DIAGNOSIS — N184 Chronic kidney disease, stage 4 (severe): Secondary | ICD-10-CM | POA: Diagnosis not present

## 2024-06-01 DIAGNOSIS — I4891 Unspecified atrial fibrillation: Secondary | ICD-10-CM | POA: Diagnosis not present

## 2024-06-01 LAB — BASIC METABOLIC PANEL WITH GFR
Anion gap: 11 (ref 5–15)
BUN: 48 mg/dL — ABNORMAL HIGH (ref 8–23)
CO2: 22 mmol/L (ref 22–32)
Calcium: 9.6 mg/dL (ref 8.9–10.3)
Chloride: 103 mmol/L (ref 98–111)
Creatinine, Ser: 2.23 mg/dL — ABNORMAL HIGH (ref 0.61–1.24)
GFR, Estimated: 30 mL/min — ABNORMAL LOW (ref >60)
Glucose, Bld: 100 mg/dL — ABNORMAL HIGH (ref 70–99)
Potassium: 4.9 mmol/L (ref 3.5–5.1)
Sodium: 136 mmol/L (ref 135–145)

## 2024-06-01 MED ORDER — IPRATROPIUM-ALBUTEROL 0.5-2.5 (3) MG/3ML IN SOLN
3.0000 mL | Freq: Three times a day (TID) | RESPIRATORY_TRACT | Status: DC
  Administered 2024-06-01: 3 mL via RESPIRATORY_TRACT
  Filled 2024-06-01: qty 3

## 2024-06-01 NOTE — Progress Notes (Signed)
 Rounding Note   Patient Name: Matthew [name] Date of Encounter: 06/01/2024  [facility] Cardiologist: Lonni Hanson, MD   Subjective Denies chest pain or palpitations.  Dyspnea improving.  Cough persists.  Remains in A-fib with ventricular rates in the 80s to 90s bpm.  Hemodynamically stable.   Scheduled Meds:  apixaban   5 mg Oral BID   busPIRone  15 mg Oral BID   calcitRIOL  0.25 mcg Oral Daily   dextromethorphan-guaiFENesin   1 tablet Oral BID   ferrous sulfate  325 mg Oral Q M,W,F   fluticasone furoate-vilanterol  1 puff Inhalation Daily   lamoTRIgine  50 mg Oral Daily   latanoprost  1 drop Both Eyes QHS   levothyroxine  25 mcg Oral Daily   metoprolol  succinate  25 mg Oral Daily   polyethylene glycol  17 g Oral Daily   prazosin  2 mg Oral QHS   sertraline  100 mg Oral Daily   sertraline  25 mg Oral Daily   traZODone   75 mg Oral QHS   Continuous Infusions:  PRN Meds: acetaminophen  **OR** acetaminophen , artificial tears, chlorpheniramine-HYDROcodone , ipratropium-albuterol , ondansetron  **OR** ondansetron  (ZOFRAN ) IV, oxyCODONE    Vital Signs  Vitals:   05/31/24 2255 06/01/24 0121 06/01/24 0555 06/01/24 0810  BP:  121/79 133/72 112/83  Pulse:  85 79 96  Resp: 15 20 20 18   Temp:  (!) 97.4 F (36.3 C) 98 F (36.7 C) 97.6 F (36.4 C)  TempSrc:  Oral Oral Axillary  SpO2: 97% 99% 98% 97%  Weight:      Height:        Intake/Output Summary (Last 24 hours) at 06/01/2024 0822 Last data filed at 06/01/2024 0000 Gross per 24 hour  Intake 300 ml  Output 975 ml  Net -675 ml      05/27/2024   10:35 AM 05/23/2024   11:13 AM 07/09/2022    7:25 PM  Last 3 Weights  Weight (lbs) 320 lb 230 lb 249 lb 12.5 oz  Weight (kg) 145.151 kg 104.327 kg 113.3 kg      Telemetry Atrial fibrillation with ventricular rates in the 80s to 90s bpm - Personally Reviewed  ECG  No new tracings - Personally Reviewed  Physical Exam  GEN: No acute distress.   Neck: JVD  difficult to assess secondary to facial hair Cardiac: Irregularly irregular, no murmurs, rubs, or gallops.  Respiratory: Clear to auscultation bilaterally. GI: Soft, nontender, non-distended  MS: No edema; No deformity. Neuro:  Nonfocal  Psych: Normal affect   Labs High Sensitivity Troponin:   Recent Labs  Lab 05/27/24 1141  TROPONINIHS 8     Chemistry Recent Labs  Lab 05/28/24 0439 05/29/24 0606 05/30/24 0415 05/31/24 0521 06/01/24 0606  NA 137 139 137 135 136  K 4.7 4.6 5.1 4.5 4.9  CL 111 109 106 103 103  CO2 20* 23 23 28 22   GLUCOSE 91 94 96 99 100*  BUN 42* 52* 45* 51* 48*  CREATININE 2.22* 2.24* 2.32* 2.30* 2.23*  CALCIUM  9.3 9.5 9.9 9.4 9.6  MG  --  2.6* 2.4 2.4  --   PROT 6.2*  --   --   --   --   ALBUMIN 2.7*  --   --   --   --   AST 12*  --   --   --   --   ALT 12  --   --   --   --   ALKPHOS 71  --   --   --   --  BILITOT 0.7  --   --   --   --   GFRNONAA 30* 30* 29* 29* 30*  ANIONGAP 6 7 8  4* 11    Lipids No results for input(s): CHOL, TRIG, HDL, LABVLDL, LDLCALC, CHOLHDL in the last 168 hours.  Hematology Recent Labs  Lab 05/27/24 1041 05/28/24 0439  WBC 9.0 6.1  RBC 4.24 4.00*  HGB 11.6* 11.2*  HCT 39.6 37.0*  MCV 93.4 92.5  MCH 27.4 28.0  MCHC 29.3* 30.3  RDW 17.3* 17.3*  PLT 260 246   Thyroid   Recent Labs  Lab 05/27/24 1141  TSH 1.377    BNP Recent Labs  Lab 05/28/24 0435 05/29/24 1109  BNP 158.5* 287.6*    DDimer No results for input(s): DDIMER in the last 168 hours.   Radiology  DG Chest Port 1 View Result Date: 05/29/2024 IMPRESSION: 1. Increasing bibasilar airspace opacities. Electronically signed by: Oneil Devonshire MD 05/29/2024 02:37 PM EDT RP Workstation: [id number]   DG Chest Port 1 View Result Date: 05/28/2024 IMPRESSION: 1. Increased pulmonary vascular congestion without overt edema. 2. Streaky bibasilar airspace opacities may reflect atelectasis and/or infiltrate. Atelectasis is favored.  Electronically Signed   By: Wilkie Lent M.D.   On: 05/28/2024 13:00    Cardiac Studies  2D echo 05/27/2024: 1. Left ventricular ejection fraction, by estimation, is 35 to 40%. The  left ventricle has moderately decreased function. The left ventricle  demonstrates global hypokinesis. Left ventricular diastolic parameters are  indeterminate.   2. Right ventricular systolic function is normal. The right ventricular  size is normal. There is normal pulmonary artery systolic pressure. The  estimated right ventricular systolic pressure is 31.0 mmHg.   3. The mitral valve is normal in structure. Mild mitral valve  regurgitation. No evidence of mitral stenosis.   4. The aortic valve is normal in structure. Aortic valve regurgitation is  not visualized. No aortic stenosis is present.   5. The inferior vena cava is normal in size with greater than 50%  respiratory variability, suggesting right atrial pressure of 3 mmHg.    Patient Profile   Matthew [name] is a 73 y.o. male with a hx of atrial fibrillation, nonischemic cardiomyopathy, hypertension, obstructive lung disease with chronic respiratory failure with hypoxia, chronic kidney disease stage III, and obstructive sleep apnea not on CPAP, whom we have been asked to see due to atrial fibrillation with rapid ventricular response.   Assessment & Plan  Persistent atrial fibrillation - Lost to follow-up since 10/2021 hospitalization, at which time he completed TEE cardioversion to restore normal sinus rhythm - Presenting for cataract surgery May 27, 2024 with atrial fibrillation with RVR - Medication noncompliance (out of his medications) - Transition from Coreg to Toprol  XL 25 mg for added ventricular rate control, and in the setting of underlying pulmonary disease - Relative hypotension precludes escalation - Continue Eliquis  5 twice daily - Not a ideal candidate for amiodarone  as he has not been anticoagulated, and with underlying  thyroid  disease - Not a good candidate for digoxin given renal dysfunction - Consider TEE/DCCV early this upcoming week   2.  HFrEF secondary to NICM - Prior catheterization in 10/2018 with no significant coronary disease - Echo detailing ejection fraction 35 to 40% - Likely tachycardia mediated cardiomyopathy - Will start Toprol  XL 25 mg in place of Coreg for added ventricular rate control as above - Low blood pressure and renal failure limiting initiation of ACEi/ARB/ARNI/MRA - Escalate GDMT as/if able - Continue  to hold IV Lasix   - Best option for his cardiomyopathy will be to restore normal sinus rhythm, currently may not be a good candidate for general anesthesia for TEE cardioversion given compromised respiratory status  3.  Chronic respiratory distress - Prior history of smoking - Unable to exclude bronchitis - In the setting of atrial fibrillation with RVR, likely component of pulmonary edema, improved - Goal maintain slightly net negative volume status  4.  Hypothyroidism - On levothyroxine as outpatient   5.  Chronic kidney disease stage IV - Stable - Creatinine 2.2 dating back several years   For questions or updates, please contact [facility] Please consult [email or web address] for contact info under    Signed, Bernardino Bring, PA-C  06/01/2024, 8:22 AM

## 2024-06-01 NOTE — Progress Notes (Signed)
 Progress Note   Patient: Matthew [name] FMW:[id number] DOB: 07-22-1951 DOA: 05/27/2024     3 DOS: the patient was seen and examined on 06/01/2024   Brief hospital course:  H&P HPI  Matthew [name] is a pleasant 73 y.o. male with medical history significant for A-fib on Eliquis , systolic chronic congestive heart failure on Lasix , CAD, bipolar disorder, depression, hypothyroidism, history of CVA who was sent over from eye surgery center after being found to be in A-fib with RVR.  Patient denies any symptoms.  Patient denies any chest pain, shortness of breath, palpitations.  He denies any nausea vomiting fever chills or any other sick feeling.   ED Course: Upon arrival to the ED, patient is found to be A-fib with RVR.  Patient was given 10 mg IV diltiazem  by EMS.  Patient was started on diltiazem  drip in the emergency room.  Hospitalist service was consulted for evaluation for admission.      Assessment and Plan:  Persistent Atrial fibrillation  Presented in afib with RVR. Initially on cardizem  infusion. Started on coreg 6.25 mg BID with improved rate control. Rates are improved with IV diuresis.  Further titration of his medications limited by blood pressures.  Per cardiology, due to medication nonadherence not appropriate for cardioversion - Cardiology following - Continue with Coreg - Telemetry monitoring - Maintain K>4, Mg>2 - Cardioversion being considered for Monday 10/20 if respiratory status is improved   HFrEF NICM  Likely tachycardia mediated cardiomyopathy.  TTE EF 35 to 40-, global hypokinesis, mild MR.  Dyspnea improved with IV diuresis - Cardiology following - Holding IV Lasix  today -Monitor I&O and daily BMP  COPD  Mild wheezing in setting of viral illness Will scheudle duonebs PFTs not available.  Continue home inhaler, reportedly on wixela     Rhinovirus + Cxr with likely atelectasis, some fluid. Having some chest congestion.  Continue mucinex  DM.  PRN nebs     Anxiety depression Continue home psychotropic medications   Hypothyroidism  TSH WNL. Continue home synthroid    Normocytic anemia  Hgb stable.  Continue iron supplementation    CKD stage IV Appears to be around baseline.  Continue to monitor daily with BMP.    OSA Nonadherent with CPAP outpatient.  Start CPAP this PM while inaptient.    History of CVA CAD LHC 10/2018 with normal coronary arteries.    Morbid Obesity BMI 41  Complicates overall care   Cognitive impairment States he has trouble remembering things. He states that his wife is no longer living. The number listed for her in the chart is disconnected.    ?Housing insecurity -Will ask TOC to look into this if they haven't already      Subjective: Patient was awake resting in bed was seen on rounds this morning.  He feels slight palpitations once in a while.  No chest pain or SOB.  Denies other complaints, overall feeling better.  We discussed cardioversion & he asked me to explain it for him.    He reports he lost his wife not long ago.  He thinks he lost his dog and not sure he'll ever see him again.  Not sure if he's lost his house but thinks he probably has.     Physical Exam: Vitals:   06/01/24 0810 06/01/24 1028 06/01/24 1259 06/01/24 1637  BP: 112/83  108/75 115/71  Pulse: 96  64   Resp: 18  (!) 23 19  Temp: 97.6 F (36.4 C)  97.8 F (  36.6 C) 98.1 F (36.7 C)  TempSrc: Axillary  Oral Oral  SpO2: 97% 95% 98%   Weight:      Height:       General exam: awake, alert, no acute distress, chronically ill-appearing HEENT: moist mucus membranes, hearing grossly normal, poor dentition Respiratory system: CTAB diminished, intermittent wheeze, no rhonchi, normal respiratory effort. Cardiovascular system: normal S1/S2, RRR, no peripheral edema Gastrointestinal system: soft, NT, ND Central nervous system: A&O x3. no gross focal neurologic deficits, normal speech Extremities: moves all , no edema, normal  tone Skin: dry, intact, normal temperature Psychiatry: normal mood, congruent affect, judgement and insight appear normal   Data Reviewed:  Notable labs: BUN 48 Creatinine 2.30 >> 2.23   Family Communication: Pt declines for me to call anyone.   Disposition: Status is: Inpatient Remains inpatient appropriate because: Severity of illness as above, cardioversion is planned.  Not yet cleared by consultants for discharge. Needs SNF    Planned Discharge Destination: Skilled nursing facility    Time spent: 38 minutes  Author: Burnard DELENA Cunning, DO 06/01/2024 6:41 PM  For on call review [email or web address].

## 2024-06-01 NOTE — Plan of Care (Signed)
  Problem: Education: Goal: Knowledge of General Education information will improve Description: Including pain rating scale, medication(s)/side effects and non-pharmacologic comfort measures Outcome: Not Progressing   Problem: Clinical Measurements: Goal: Respiratory complications will improve Outcome: Not Progressing Goal: Cardiovascular complication will be avoided Outcome: Progressing   Problem: Activity: Goal: Risk for activity intolerance will decrease Outcome: Not Progressing

## 2024-06-01 NOTE — Progress Notes (Signed)
 PT Cancellation Note  Patient Details Name: Matthew [name] MRN: [id number] DOB: 1950/12/04   Cancelled Treatment:    Reason Eval/Treat Not Completed: Patient declined, no reason specified Patient eating lunch, but declined PT even after finished. States he got up earlier. Will re-attempt at later date.   Eleah Lahaie 06/01/2024, 1:56 PM

## 2024-06-02 ENCOUNTER — Inpatient Hospital Stay

## 2024-06-02 ENCOUNTER — Telehealth (HOSPITAL_COMMUNITY): Payer: Self-pay

## 2024-06-02 ENCOUNTER — Other Ambulatory Visit (HOSPITAL_COMMUNITY): Payer: Self-pay

## 2024-06-02 DIAGNOSIS — I5022 Chronic systolic (congestive) heart failure: Secondary | ICD-10-CM | POA: Diagnosis not present

## 2024-06-02 DIAGNOSIS — I4891 Unspecified atrial fibrillation: Secondary | ICD-10-CM | POA: Diagnosis not present

## 2024-06-02 DIAGNOSIS — J441 Chronic obstructive pulmonary disease with (acute) exacerbation: Secondary | ICD-10-CM | POA: Diagnosis not present

## 2024-06-02 DIAGNOSIS — B348 Other viral infections of unspecified site: Secondary | ICD-10-CM | POA: Diagnosis present

## 2024-06-02 DIAGNOSIS — I4811 Longstanding persistent atrial fibrillation: Secondary | ICD-10-CM | POA: Diagnosis not present

## 2024-06-02 DIAGNOSIS — N184 Chronic kidney disease, stage 4 (severe): Secondary | ICD-10-CM | POA: Diagnosis not present

## 2024-06-02 MED ORDER — PREDNISONE 20 MG PO TABS
40.0000 mg | ORAL_TABLET | Freq: Every day | ORAL | Status: DC
  Administered 2024-06-03 – 2024-06-05 (×3): 40 mg via ORAL
  Filled 2024-06-02: qty 4
  Filled 2024-06-02 (×2): qty 2

## 2024-06-02 MED ORDER — METOPROLOL SUCCINATE ER 50 MG PO TB24
50.0000 mg | ORAL_TABLET | Freq: Every day | ORAL | Status: DC
  Administered 2024-06-03 – 2024-06-06 (×4): 50 mg via ORAL
  Filled 2024-06-02 (×4): qty 1

## 2024-06-02 MED ORDER — ORAL CARE MOUTH RINSE: 15.0000 mL | OROMUCOSAL | Status: DC | PRN

## 2024-06-02 MED ORDER — SENNOSIDES-DOCUSATE SODIUM 8.6-50 MG PO TABS
1.0000 | ORAL_TABLET | Freq: Two times a day (BID) | ORAL | Status: DC
  Administered 2024-06-02 – 2024-06-06 (×8): 1 via ORAL
  Filled 2024-06-02 (×8): qty 1

## 2024-06-02 MED ORDER — SODIUM CHLORIDE 0.9 % IV SOLN
1.0000 g | INTRAVENOUS | Status: DC
  Administered 2024-06-02: 1 g via INTRAVENOUS
  Filled 2024-06-02: qty 10

## 2024-06-02 MED ORDER — IPRATROPIUM-ALBUTEROL 0.5-2.5 (3) MG/3ML IN SOLN
3.0000 mL | Freq: Two times a day (BID) | RESPIRATORY_TRACT | Status: DC
  Administered 2024-06-02 – 2024-06-06 (×9): 3 mL via RESPIRATORY_TRACT
  Filled 2024-06-02 (×10): qty 3

## 2024-06-02 MED ORDER — DOXYCYCLINE HYCLATE 100 MG PO TABS
100.0000 mg | ORAL_TABLET | Freq: Two times a day (BID) | ORAL | Status: DC
  Administered 2024-06-02 – 2024-06-06 (×8): 100 mg via ORAL
  Filled 2024-06-02 (×8): qty 1

## 2024-06-02 MED ORDER — HYDROCOD POLI-CHLORPHE POLI ER 10-8 MG/5ML PO SUER
5.0000 mL | Freq: Two times a day (BID) | ORAL | Status: DC | PRN
Start: 2024-06-02 — End: 2024-06-07
  Administered 2024-06-02 – 2024-06-06 (×4): 5 mL via ORAL
  Filled 2024-06-02 (×4): qty 5

## 2024-06-02 MED ORDER — BISACODYL 10 MG RE SUPP: 10.0000 mg | Freq: Every day | RECTAL | Status: DC | PRN

## 2024-06-02 MED ORDER — BISACODYL 5 MG PO TBEC
5.0000 mg | DELAYED_RELEASE_TABLET | Freq: Every day | ORAL | Status: DC | PRN
  Administered 2024-06-02 – 2024-06-03 (×2): 5 mg via ORAL
  Filled 2024-06-02 (×2): qty 1

## 2024-06-02 NOTE — Progress Notes (Signed)
 Progress Note   Patient: GERRY BLANCHFIELD FMW:[id number] DOB: [birth year] DOA: 05/27/2024     4 DOS: the patient was seen and examined on 06/02/2024   Brief hospital course:  H&P HPI  HOLGER SOKOLOWSKI is a pleasant 73 y.o. male with medical history significant for A-fib on Eliquis , systolic chronic congestive heart failure on Lasix , CAD, bipolar disorder, depression, hypothyroidism, history of CVA who was sent over from eye surgery center after being found to be in A-fib with RVR.  Patient denies any symptoms.  Patient denies any chest pain, shortness of breath, palpitations.  He denies any nausea vomiting fever chills or any other sick feeling.   ED Course: Upon arrival to the ED, patient is found to be A-fib with RVR.  Patient was given 10 mg IV diltiazem  by EMS.  Patient was started on diltiazem  drip in the emergency room.  Hospitalist service was consulted for evaluation for admission.   Further hospital course and management as outlined below.  Assessment and Plan:  Persistent Atrial fibrillation  Presented in afib with RVR. Initially on cardizem  infusion. Started on coreg 6.25 mg BID with improved rate control. Rates are improved with IV diuresis.  Further titration of his medications limited by blood pressures.  Per cardiology, due to medication nonadherence not appropriate for cardioversion - Cardiology following - Continue with Coreg - Telemetry monitoring - Maintain K>4, Mg>2 - Cardioversion had been planned for today but canceled pending improvement in respiratory status   HFrEF NICM  Likely tachycardia mediated cardiomyopathy.  TTE EF 35 to 40-, global hypokinesis, mild MR.  Dyspnea and volume status improved with IV diuresis - Cardiology following - Holding IV Lasix   -Monitor I&O and daily BMP  COPD with mild acute exacerbation 10/20: Patient continues wheezing despite getting scheduled nebulizers --Start prednisone  40 mg daily --Continue scheduled duonebs --Repeat  chest x-ray today -pending --Continue home inhaler, reportedly on wixela   Rhinovirus + Cxr with likely atelectasis, some fluid. Having some chest congestion.  Continue mucinex  DM.  Tussionex as needed Neb treatments as above  Anxiety depression Continue home psychotropic medications   Hypothyroidism  TSH WNL. Continue home synthroid    Normocytic anemia  Hgb stable.  Continue iron supplementation    CKD stage IV Appears to be around baseline.  Continue to monitor with BMP.  Avoid nephrotoxins and renally dose meds   OSA Nonadherent with CPAP outpatient.  CPAP nightly while admitted   History of CVA CAD LHC 10/2018 with normal coronary arteries.    Morbid Obesity BMI 41  Complicates overall care and prognosis   Cognitive impairment States he has trouble remembering things. He states that his wife is no longer living. The number listed for her in the chart is disconnected.    ?Housing insecurity -Will ask TOC to look into this if they haven't already      Subjective: Patient awake resting in bed when seen on rounds this morning.  He reports mild shortness of breath and wheezing.  Otherwise states he feels okay, failure.  He has not heard anything about cardioversion yet today but did not eat any breakfast.   Physical Exam: Vitals:   06/02/24 0900 06/02/24 1036 06/02/24 1100 06/02/24 1200  BP:    114/86  Pulse:  73 84 87  Resp:  15 14 14   Temp:      TempSrc:      SpO2: 98% 94% 97% 97%  Weight:      Height:  General exam: awake, alert, no acute distress, chronically ill-appearing HEENT: moist mucus membranes, hearing grossly normal, poor dentition Respiratory system: Lung sounds diminished with faint expiratory wheezes, normal respiratory effort at rest. Cardiovascular system: normal S1/S2, RRR, no peripheral edema Gastrointestinal system: soft, NT, ND Central nervous system: A&O x3. no gross focal neurologic deficits, normal speech Extremities: moves  all , no edema, normal tone Psychiatry: normal mood, congruent affect   Data Reviewed: No new labs today Notable labs 10/19: BUN 48 Creatinine 2.30 >> 2.23  Chest x-ray today pending report  Family Communication: Pt declines for me to call anyone.   Disposition: Status is: Inpatient Remains inpatient appropriate because: Severity of illness as above, cardioversion is planned.  Not yet cleared by consultants for discharge. Needs SNF    Planned Discharge Destination: Skilled nursing facility    Time spent: 35 minutes  Author: Burnard DELENA Cunning, DO 06/02/2024 3:21 PM  For on call review [email or web address].

## 2024-06-02 NOTE — Progress Notes (Signed)
 Heart Failure Navigator Progress Note  Patient remains hospitalized for original scheduled HF TOC. Rescheduled for 06/10/24 @ 10:00. Updated patient discharge AVS with appointment details for facility when he is discharged.   Navigator available for reassessment of patient and will sign off at this time.  Charmaine Pines, RN, BSN [facility] Heart Failure Navigator Secure Chat Only

## 2024-06-02 NOTE — Plan of Care (Signed)

## 2024-06-02 NOTE — Plan of Care (Signed)
 Problem: Education: Goal: Knowledge of General Education information will improve Description: Including pain rating scale, medication(s)/side effects and non-pharmacologic comfort measures 06/02/2024 1356 by Balinda Stan Lauralyn FORBES, RN Outcome: Progressing 06/02/2024 1355 by Balinda Stan Lauralyn FORBES, RN Outcome: Progressing   Problem: Health Behavior/Discharge Planning: Goal: Ability to manage health-related needs will improve 06/02/2024 1356 by Balinda Stan Lauralyn FORBES, RN Outcome: Progressing 06/02/2024 1355 by Balinda Stan Lauralyn FORBES, RN Outcome: Progressing   Problem: Clinical Measurements: Goal: Ability to maintain clinical measurements within normal limits will improve 06/02/2024 1356 by Balinda Stan Lauralyn FORBES, RN Outcome: Progressing 06/02/2024 1355 by Balinda Stan Lauralyn FORBES, RN Outcome: Progressing Goal: Will remain free from infection 06/02/2024 1356 by Balinda Stan Lauralyn FORBES, RN Outcome: Progressing 06/02/2024 1355 by Balinda Stan Lauralyn FORBES, RN Outcome: Progressing Goal: Diagnostic test results will improve 06/02/2024 1356 by Balinda Stan Lauralyn FORBES, RN Outcome: Progressing 06/02/2024 1355 by Balinda Stan Lauralyn FORBES, RN Outcome: Progressing Goal: Respiratory complications will improve 06/02/2024 1356 by Balinda Stan Lauralyn FORBES, RN Outcome: Progressing 06/02/2024 1355 by Balinda Stan Lauralyn FORBES, RN Outcome: Progressing Goal: Cardiovascular complication will be avoided 06/02/2024 1356 by Balinda Stan Lauralyn FORBES, RN Outcome: Progressing 06/02/2024 1355 by Balinda Stan Lauralyn FORBES, RN Outcome: Progressing   Problem: Activity: Goal: Risk for activity intolerance will decrease 06/02/2024 1356 by Balinda Stan Lauralyn FORBES, RN Outcome: Progressing 06/02/2024 1355 by Balinda Stan Lauralyn FORBES, RN Outcome: Progressing   Problem: Nutrition: Goal: Adequate nutrition will be maintained 06/02/2024 1356 by Balinda Stan Lauralyn FORBES, RN Outcome: Progressing 06/02/2024 1355 by Balinda Stan Lauralyn FORBES, RN Outcome: Progressing   Problem: Coping: Goal: Level of anxiety will decrease Outcome: Progressing   Problem: Elimination: Goal: Will not experience complications related to bowel motility Outcome: Progressing Goal: Will not experience complications related to urinary retention 06/02/2024 1356 by Balinda Stan Lauralyn FORBES, RN Outcome: Progressing 06/02/2024 1355 by Balinda Stan Lauralyn FORBES, RN Outcome: Progressing   Problem: Pain Managment: Goal: General experience of comfort will improve and/or be controlled 06/02/2024 1356 by Balinda Stan Lauralyn FORBES, RN Outcome: Progressing 06/02/2024 1355 by Balinda Stan Lauralyn FORBES, RN Outcome: Progressing   Problem: Safety: Goal: Ability to remain free from injury will improve 06/02/2024 1356 by Balinda Stan Lauralyn FORBES, RN Outcome: Progressing 06/02/2024 1355 by Balinda Stan Lauralyn FORBES, RN Outcome: Progressing   Problem: Skin Integrity: Goal: Risk for impaired skin integrity will decrease 06/02/2024 1356 by Balinda Stan Lauralyn FORBES, RN Outcome: Progressing 06/02/2024 1355 by Balinda Stan Lauralyn FORBES, RN Outcome: Progressing   Problem: Education: Goal: Knowledge of disease or condition will improve 06/02/2024 1356 by Balinda Stan Lauralyn FORBES, RN Outcome: Progressing 06/02/2024 1355 by Balinda Stan Lauralyn FORBES, RN Outcome: Progressing Goal: Understanding of medication regimen will improve 06/02/2024 1356 by Balinda Stan Lauralyn FORBES, RN Outcome: Progressing 06/02/2024 1355 by Balinda Stan Lauralyn FORBES, RN Outcome: Progressing Goal: Individualized Educational Video(s) Outcome: Progressing   Problem: Activity: Goal: Ability to tolerate increased activity will improve 06/02/2024 1356 by Balinda Stan Lauralyn FORBES, RN Outcome: Progressing 06/02/2024 1355 by Balinda Stan Lauralyn FORBES, RN Outcome: Progressing   Problem:  Cardiac: Goal: Ability to achieve and maintain adequate cardiopulmonary perfusion will improve 06/02/2024 1356 by Balinda Stan Lauralyn FORBES, RN Outcome: Progressing 06/02/2024 1355 by Balinda Stan Lauralyn FORBES, RN Outcome: Progressing   Problem: Health Behavior/Discharge Planning: Goal: Ability to safely manage health-related needs after discharge will improve 06/02/2024 1356 by Balinda Stan Lauralyn FORBES, RN Outcome: Progressing 06/02/2024 1355 by Balinda Stan, Endre Coutts E,  RN Outcome: Progressing

## 2024-06-02 NOTE — Progress Notes (Signed)
 Rounding Note   Patient Name: Matthew [name] Date of Encounter: 06/02/2024  [facility] Cardiologist: Lonni Hanson, MD   Subjective Patient reports worsening shortness of breath today. Faint wheezing noted on exam. He remains in atrial fibrillation with rates largely controlled on current regimen. Will plan for TEE/DCCV once respiratory status improves.   Scheduled Meds:  apixaban   5 mg Oral BID   busPIRone  15 mg Oral BID   calcitRIOL  0.25 mcg Oral Daily   dextromethorphan-guaiFENesin   1 tablet Oral BID   ferrous sulfate  325 mg Oral Q M,W,F   fluticasone furoate-vilanterol  1 puff Inhalation Daily   ipratropium-albuterol   3 mL Nebulization BID   lamoTRIgine  50 mg Oral Daily   latanoprost  1 drop Both Eyes QHS   levothyroxine  25 mcg Oral Daily   metoprolol  succinate  25 mg Oral Daily   polyethylene glycol  17 g Oral Daily   prazosin  2 mg Oral QHS   sertraline  100 mg Oral Daily   sertraline  25 mg Oral Daily   traZODone   75 mg Oral QHS   Continuous Infusions:  PRN Meds: acetaminophen  **OR** acetaminophen , artificial tears, chlorpheniramine-HYDROcodone , ondansetron  **OR** ondansetron  (ZOFRAN ) IV, oxyCODONE    Vital Signs  Vitals:   06/02/24 0728 06/02/24 0812 06/02/24 0900 06/02/24 1036  BP:  116/88    Pulse: 73 79  73  Resp: 20 17  15   Temp:  97.9 F (36.6 C)    TempSrc:  Oral    SpO2: 92% 93% 98% 94%  Weight:      Height:        Intake/Output Summary (Last 24 hours) at 06/02/2024 1115 Last data filed at 06/02/2024 0915 Gross per 24 hour  Intake 420 ml  Output 1525 ml  Net -1105 ml      05/27/2024   10:35 AM 05/23/2024   11:13 AM 07/09/2022    7:25 PM  Last 3 Weights  Weight (lbs) 320 lb 230 lb 249 lb 12.5 oz  Weight (kg) 145.151 kg 104.327 kg 113.3 kg      Telemetry Atrial fibrillation, rate 80-100 bpm, brief episodes up to 120s - Personally Reviewed  Physical Exam  GEN: No acute distress.   Neck: No JVD Cardiac: IRIR, no  murmurs, rubs, or gallops.  Respiratory: Clear to auscultation bilaterally. GI: Soft, nontender, non-distended  MS: No edema; No deformity. Neuro:  Nonfocal  Psych: Normal affect   Labs High Sensitivity Troponin:   Recent Labs  Lab 05/27/24 1141  TROPONINIHS 8     Chemistry Recent Labs  Lab 05/28/24 0439 05/29/24 0606 05/30/24 0415 05/31/24 0521 06/01/24 0606  NA 137 139 137 135 136  K 4.7 4.6 5.1 4.5 4.9  CL 111 109 106 103 103  CO2 20* 23 23 28 22   GLUCOSE 91 94 96 99 100*  BUN 42* 52* 45* 51* 48*  CREATININE 2.22* 2.24* 2.32* 2.30* 2.23*  CALCIUM  9.3 9.5 9.9 9.4 9.6  MG  --  2.6* 2.4 2.4  --   PROT 6.2*  --   --   --   --   ALBUMIN 2.7*  --   --   --   --   AST 12*  --   --   --   --   ALT 12  --   --   --   --   ALKPHOS 71  --   --   --   --   BILITOT  0.7  --   --   --   --   GFRNONAA 30* 30* 29* 29* 30*  ANIONGAP 6 7 8  4* 11    Lipids No results for input(s): CHOL, TRIG, HDL, LABVLDL, LDLCALC, CHOLHDL in the last 168 hours.  Hematology Recent Labs  Lab 05/27/24 1041 05/28/24 0439  WBC 9.0 6.1  RBC 4.24 4.00*  HGB 11.6* 11.2*  HCT 39.6 37.0*  MCV 93.4 92.5  MCH 27.4 28.0  MCHC 29.3* 30.3  RDW 17.3* 17.3*  PLT 260 246   Thyroid   Recent Labs  Lab 05/27/24 1141  TSH 1.377    BNP Recent Labs  Lab 05/28/24 0435 05/29/24 1109  BNP 158.5* 287.6*    DDimer No results for input(s): DDIMER in the last 168 hours.   Radiology  No results found.  Cardiac Studies  2D echo 05/27/2024: 1. Left ventricular ejection fraction, by estimation, is 35 to 40%. The  left ventricle has moderately decreased function. The left ventricle  demonstrates global hypokinesis. Left ventricular diastolic parameters are  indeterminate.   2. Right ventricular systolic function is normal. The right ventricular  size is normal. There is normal pulmonary artery systolic pressure. The  estimated right ventricular systolic pressure is 31.0 mmHg.   3. The  mitral valve is normal in structure. Mild mitral valve  regurgitation. No evidence of mitral stenosis.   4. The aortic valve is normal in structure. Aortic valve regurgitation is  not visualized. No aortic stenosis is present.   5. The inferior vena cava is normal in size with greater than 50%  respiratory variability, suggesting right atrial pressure of 3 mmHg.   Patient Profile   73 y.o. male with history of atrial fibrillation, nonischemic cardiomyopathy, hypertension, obstructive lung disease with chronic respiratory failure with hypoxia, CKD stage IV, and obstructive sleep apnea not on CPAP who is being seen for the ongoing management of atrial fibrillation with RVR.  Assessment & Plan   Persistent atrial fibrillation - Hospitalization 10/2021 at which time he completed TEE cardioversion to restore sinus rhythm, subsequently lost to follow-up - Presented for cataract surgery 10/14 found to be in atrial fibrillation with RVR - Medication noncompliance (out of medications) - Was transitioned yesterday from carvedilol to metoprolol  succinate 25 mg daily for rate control and in the setting of underlying pulmonary disease and relative hypotension - Continue Eliquis  5 mg twice daily - Not an ideal candidate for amiodarone  given he has not been anticoagulated and with underlying thyroid  disease.  Not a good candidate for digoxin given renal dysfunction. - Will plan for TEE/DCCV once respiratory status has improved, keep NPO after midnight in case of procedure  HFrEF Nonischemic cardiomyopathy - Prior catheterization 10/2018 with no significant CAD - Echo this admission with EF 35 to 40%, likely tachycardia mediated cardiomyopathy - Continue metoprolol  succinate as above - Relative hypotension and renal dysfunction Limited initiation of ACE/ARB/ARNI/MRA - Will plan to escalate GDMT as/if able - Appears relatively euvolemic on exam, continue to hold IV Lasix  - Aim to maintain slightly net  negative volume status - Best option for his cardiomyopathy would be to restore normal sinus rhythm as detailed above.  Will plan for TEE/DCCV once respiratory status is improved.  Chronic respiratory distress COPD - Prior history of smoking - Likely component of pulmonary edema in the setting of atrial fibrillation with RVR, s/p IV diuresis - Ongoing management per IM  Hypothyroidism  - On levothyroxine as outpatient  CKD IV - Overall stable,  baseline creatinine appears to be 2.2 dating back several years  Informed Consent   Shared Decision Making/Informed Consent   The risks [stroke, cardiac arrhythmias rarely resulting in the need for a temporary or permanent pacemaker, skin irritation or burns, esophageal damage, perforation (1:10,000 risk), bleeding, pharyngeal hematoma as well as other potential complications associated with conscious sedation including aspiration, arrhythmia, respiratory failure and death], benefits (treatment guidance, restoration of normal sinus rhythm, diagnostic support) and alternatives of a transesophageal echocardiogram guided cardioversion were discussed in detail with Mr. Spivack and he is willing to proceed.       For questions or updates, please contact [facility] Please consult [email or web address] for contact info under       Signed, Lesley LITTIE Maffucci, PA-C  06/02/2024, 11:15 AM

## 2024-06-02 NOTE — Telephone Encounter (Signed)
 Pharmacy Patient Advocate Encounter  Insurance verification completed.    The patient is insured through [employer]. Patient has Medicare and is not eligible for a copay card, but may be able to apply for patient assistance or Medicare RX Payment Plan (Patient Must reach out to their plan, if eligible for payment plan), if available.    Ran test claim for Entresto 24-26 and the current 30 day co-pay is $0.  Ran test claim for Farxiga 10mg  and the current 30 day co-pay is $0.  Ran test claim for Jardiance 10mg  and the current 30 day co-pay is $0.   This test claim was processed through [employer]- copay amounts may vary at other pharmacies due to [employer], or as the patient moves through the different stages of their insurance plan.

## 2024-06-02 NOTE — Progress Notes (Signed)
 PT Cancellation Note  Patient Details Name: Matthew [name] MRN: [id number] DOB: 05-17-1951   Cancelled Treatment:     Therapist in this pm, pt stated he wasn't feeling well and could not tolerate PT this date. Will re-attempt next available date/time per POC.    [name] 06/02/2024, 3:35 PM

## 2024-06-03 ENCOUNTER — Inpatient Hospital Stay

## 2024-06-03 DIAGNOSIS — J9621 Acute and chronic respiratory failure with hypoxia: Secondary | ICD-10-CM

## 2024-06-03 DIAGNOSIS — I4891 Unspecified atrial fibrillation: Secondary | ICD-10-CM

## 2024-06-03 DIAGNOSIS — I10 Essential (primary) hypertension: Secondary | ICD-10-CM

## 2024-06-03 DIAGNOSIS — I4819 Other persistent atrial fibrillation: Secondary | ICD-10-CM | POA: Diagnosis not present

## 2024-06-03 DIAGNOSIS — I4811 Longstanding persistent atrial fibrillation: Secondary | ICD-10-CM | POA: Diagnosis not present

## 2024-06-03 LAB — BASIC METABOLIC PANEL WITH GFR
Anion gap: 8 (ref 5–15)
Anion gap: 8 (ref 5–15)
Anion gap: 10 (ref 5–15)
BUN: 45 mg/dL — ABNORMAL HIGH (ref 8–23)
BUN: 45 mg/dL — ABNORMAL HIGH (ref 8–23)
BUN: 47 mg/dL — ABNORMAL HIGH (ref 8–23)
CO2: 25 mmol/L (ref 22–32)
CO2: 23 mmol/L (ref 22–32)
CO2: 23 mmol/L (ref 22–32)
Calcium: 10.2 mg/dL (ref 8.9–10.3)
Calcium: 9.9 mg/dL (ref 8.9–10.3)
Calcium: 10.2 mg/dL (ref 8.9–10.3)
Chloride: 104 mmol/L (ref 98–111)
Chloride: 107 mmol/L (ref 98–111)
Chloride: 104 mmol/L (ref 98–111)
Creatinine, Ser: 2.29 mg/dL — ABNORMAL HIGH (ref 0.61–1.24)
Creatinine, Ser: 2.21 mg/dL — ABNORMAL HIGH (ref 0.61–1.24)
Creatinine, Ser: 2.2 mg/dL — ABNORMAL HIGH (ref 0.61–1.24)
GFR, Estimated: 29 mL/min — ABNORMAL LOW (ref >60)
GFR, Estimated: 30 mL/min — ABNORMAL LOW (ref >60)
GFR, Estimated: 31 mL/min — ABNORMAL LOW (ref >60)
Glucose, Bld: 90 mg/dL (ref 70–99)
Glucose, Bld: 93 mg/dL (ref 70–99)
Glucose, Bld: 129 mg/dL — ABNORMAL HIGH (ref 70–99)
Potassium: 6 mmol/L — ABNORMAL HIGH (ref 3.5–5.1)
Potassium: 6 mmol/L — ABNORMAL HIGH (ref 3.5–5.1)
Potassium: 5.4 mmol/L — ABNORMAL HIGH (ref 3.5–5.1)
Sodium: 137 mmol/L (ref 135–145)
Sodium: 138 mmol/L (ref 135–145)
Sodium: 137 mmol/L (ref 135–145)

## 2024-06-03 LAB — CBC
HCT: 40.3 % (ref 39.0–52.0)
Hemoglobin: 12 g/dL — ABNORMAL LOW (ref 13.0–17.0)
MCH: 27.7 pg (ref 26.0–34.0)
MCHC: 29.8 g/dL — ABNORMAL LOW (ref 30.0–36.0)
MCV: 93.1 fL (ref 80.0–100.0)
Platelets: 268 K/uL (ref 150–400)
RBC: 4.33 MIL/uL (ref 4.22–5.81)
RDW: 16.1 % — ABNORMAL HIGH (ref 11.5–15.5)
WBC: 5.7 K/uL (ref 4.0–10.5)
nRBC: 0 % (ref 0.0–0.2)

## 2024-06-03 LAB — GLUCOSE, CAPILLARY
Glucose-Capillary: 92 mg/dL (ref 70–99)
Glucose-Capillary: 124 mg/dL — ABNORMAL HIGH (ref 70–99)
Glucose-Capillary: 209 mg/dL — ABNORMAL HIGH (ref 70–99)
Glucose-Capillary: 126 mg/dL — ABNORMAL HIGH (ref 70–99)

## 2024-06-03 LAB — MRSA NEXT GEN BY PCR, NASAL: MRSA by PCR Next Gen: NOT DETECTED

## 2024-06-03 MED ORDER — IOHEXOL 350 MG/ML SOLN
50.0000 mL | Freq: Once | INTRAVENOUS | Status: AC | PRN
Start: 2024-06-03 — End: 2024-06-03
  Administered 2024-06-03: 50 mL via INTRAVENOUS

## 2024-06-03 MED ORDER — CALCIUM GLUCONATE-NACL 1-0.675 GM/50ML-% IV SOLN
1.0000 g | Freq: Once | INTRAVENOUS | Status: AC
  Administered 2024-06-03: 1000 mg via INTRAVENOUS
  Filled 2024-06-03: qty 50

## 2024-06-03 MED ORDER — SODIUM ZIRCONIUM CYCLOSILICATE 10 G PO PACK
10.0000 g | PACK | Freq: Once | ORAL | Status: AC
  Administered 2024-06-03: 10 g via ORAL
  Filled 2024-06-03: qty 1

## 2024-06-03 MED ORDER — SODIUM CHLORIDE 0.9 % IV SOLN
2.0000 g | INTRAVENOUS | Status: DC
  Administered 2024-06-03 – 2024-06-04 (×2): 2 g via INTRAVENOUS
  Filled 2024-06-03 (×3): qty 20

## 2024-06-03 MED ORDER — INSULIN ASPART 100 UNIT/ML IV SOLN
5.0000 [IU] | Freq: Once | INTRAVENOUS | Status: AC
  Administered 2024-06-03: 5 [IU] via INTRAVENOUS
  Filled 2024-06-03: qty 0.05

## 2024-06-03 MED ORDER — SODIUM CHLORIDE 0.9 % IV SOLN: INTRAVENOUS | Status: AC

## 2024-06-03 MED ORDER — DEXTROSE 50 % IV SOLN
1.0000 | Freq: Once | INTRAVENOUS | Status: AC
  Administered 2024-06-03: 50 mL via INTRAVENOUS
  Filled 2024-06-03: qty 50

## 2024-06-03 NOTE — Progress Notes (Signed)
 Informed attending noah bedford  patient has been increasingly confused, when he was admitted he was completely orientated and now he's A&O x1. He is only orientated to self. he also is very forgetful. He asked me 3 times in why he was in the hospital   Attending noah voiced understanding and no new orders were placed. Will continue to monitor

## 2024-06-03 NOTE — Progress Notes (Signed)
 OT Cancellation Note  Patient Details Name: Matthew [name] MRN: [id number] DOB: [birth year]   Cancelled Treatment:    Reason Eval/Treat Not Completed: Medical issues which prohibited therapy. Upon entering room for PT/OT session, nursing in process of calling a code stroke. Stroke team arrived to assess, and pt sent off floor for CT Scan. Will reassess next available date per POC.   Rani Sisney E Luke Falero 06/03/2024, 3:11 PM

## 2024-06-03 NOTE — Code Documentation (Signed)
 Stroke Response Nurse Documentation Code Documentation  Matthew [name] is a 73 y.o. male admitted to [facility] on 10/14 for a-fib/RVR, positive for rhinovirus with past medical hx of CAD, CHF, sleep apnea, a-fib. On Eliquis  (apixaban ) daily. Code stroke was activated by primary RN.   Patient on 2A unit where he was LKW at 1240. RN Ayoodeji at bedside reports that patient has been confused yesterday and today. Today at approx 1405, she noticed that patient had asymmetrical pupils. She performed a visual exam and noted left visual field deficit. Code stroke was called. Stroke coordinator, neurologist, primary RN, and rapid response accompany patient to CT.    Stroke team at the bedside after patient activation. Patient to CT with team. NIHSS 3, see documentation for details and code stroke times. Patient with disoriented and right limb ataxia (right lower extremity) on exam. The following imaging was completed:  CT Head and CTA. Patient is not a candidate for IV Thrombolytic due to recent Eliquis  dose this morning, per MD. Patient is not a candidate for IR due to no LVO on imaging, per MD.   Care/Plan: every 2 hour NIHSS, swallow screen per protocol. Transfer patient.   Process Delays Noted: n/a  Bedside handoff with RN Ayoodeji.    Approx time spent with patient: 40 min  Burnard KANDICE Bras  Stroke Response RN

## 2024-06-03 NOTE — Progress Notes (Signed)
 Noted asymmetrical pupils on examination. Right pupil 5 and left pupil 2. Patient also presented with left visual field deficit. Patient disorientated to time and situation. Code stroke called, attending paged, vitals and glucose poc obtained, NIHSS started. Stoke team and attending at bedside. RN accompanied patient to CT.

## 2024-06-03 NOTE — TOC Progression Note (Signed)
 Transition of Care [facility]) - Progression Note    Patient Details  Name: Matthew [name] MRN: [id number] Date of Birth: October 12, 1950  [facility] of [facility]) CM/SW Contact  Alfonso Rummer, LCSW Phone Number: 06/03/2024, 9:39 AM  Clinical Narrative:     LCSW A. Bereket Gernert informed pt of snf bed offers [name] reports he is okay with [facility]film set or [facility]. [name] reports whichever snf had an available bed he is willing to transition. LCSW A [employer] they can accept [name] 10/22.   Expected Discharge Plan: Skilled Nursing Facility Barriers to Discharge: Continued Medical Work up               Expected Discharge Plan and Services     Post Acute Care Choice: Skilled Nursing Facility Living arrangements for the past 2 months: Mobile Home                                       Social Drivers of Health (SDOH) Interventions SDOH Screenings   Food Insecurity: No Food Insecurity (05/27/2024)  Housing: High Risk (05/27/2024)  Transportation Needs: No Transportation Needs (05/30/2024)  Utilities: Not At Risk (05/27/2024)  Social Connections: Moderately Integrated (05/27/2024)  Tobacco Use: Low Risk  (05/30/2024)    Readmission Risk Interventions    05/04/2022   10:42 AM 10/18/2021   12:39 PM  Readmission Risk Prevention Plan  Transportation Screening  Complete  HRI or Home Care Consult Complete   Palliative Care Screening Complete   Medication Review (RN Care Manager) Complete Complete  PCP or Specialist appointment within 3-5 days of discharge  Complete  HRI or Home Care Consult  Complete  SW Recovery Care/Counseling Consult  Complete  Palliative Care Screening  Not Applicable  Skilled Nursing Facility  Not Applicable

## 2024-06-03 NOTE — Significant Event (Signed)
 Rapid Response Event Note   Reason for Call :   CODE STROKE Initial Focused Assessment:   Per bedside RN- patient having unequal pupils and unable to see vision on the left side which RN stated is new.  Dr. Kandis and Dr. Matthews at the bedside assessing patient. Vitals stable and patient on 2 liters of oxygen .      Interventions:  Took patient to CT of head.  Plan of Care:   Plan for patient to have MRI of head per Dr. Matthews.  Event Summary:   MD Notified:  Call Time: Arrival Time: End Time:  Allena JINNY Gunther, RN

## 2024-06-03 NOTE — Progress Notes (Signed)
   06/03/24 1410  Spiritual Encounters  Type of Visit Initial  Care provided to: Patient  Conversation partners present during encounter Other (comment) (Stroke Team; Pt taken to CT)  Referral source Code page  Reason for visit Code  OnCall Visit Yes  Spiritual Framework  Presenting Themes Goals in life/care  Interventions  Spiritual Care Interventions Made Compassionate presence  Intervention Outcomes  Outcomes Other (comment) (Nurse will call if Pt's family comes.)  Spiritual Care Plan  Spiritual Care Issues Still Outstanding Chaplain will continue to follow

## 2024-06-03 NOTE — Plan of Care (Signed)

## 2024-06-03 NOTE — Progress Notes (Signed)
 Rounding Note   Patient Name: Matthew [name] Date of Encounter: 06/03/2024  [facility] Cardiologist: Lonni Hanson, MD   Subjective Patient reports worsening shortness of breath and overall not feeling well.  He is positive for rhinovirus.  Remains in atrial fibrillation with rates overall well-controlled at 80 to 100 bpm.  Will defer TEE/DCCV today given ongoing dyspnea.  Scheduled Meds:  apixaban   5 mg Oral BID   busPIRone  15 mg Oral BID   calcitRIOL  0.25 mcg Oral Daily   dextromethorphan-guaiFENesin   1 tablet Oral BID   doxycycline   100 mg Oral Q12H   ferrous sulfate  325 mg Oral Q M,W,F   fluticasone furoate-vilanterol  1 puff Inhalation Daily   ipratropium-albuterol   3 mL Nebulization BID   lamoTRIgine  50 mg Oral Daily   latanoprost  1 drop Both Eyes QHS   levothyroxine  25 mcg Oral Daily   metoprolol  succinate  50 mg Oral Daily   polyethylene glycol  17 g Oral Daily   prazosin  2 mg Oral QHS   predniSONE   40 mg Oral Q breakfast   senna-docusate  1 tablet Oral BID   sertraline  100 mg Oral Daily   sertraline  25 mg Oral Daily   traZODone   75 mg Oral QHS   Continuous Infusions:  sodium chloride  100 mL/hr at 06/03/24 1506   cefTRIAXone  (ROCEPHIN )  IV     PRN Meds: acetaminophen  **OR** acetaminophen , artificial tears, bisacodyl , bisacodyl , chlorpheniramine-HYDROcodone , ondansetron  **OR** ondansetron  (ZOFRAN ) IV, mouth rinse, oxyCODONE    Vital Signs  Vitals:   06/03/24 0400 06/03/24 0834 06/03/24 1100 06/03/24 1200  BP:  118/64  107/68  Pulse:  73 81   Resp:  20  20  Temp: 97.9 F (36.6 C) 97.6 F (36.4 C)  (!) 97.5 F (36.4 C)  TempSrc: Oral Axillary  Axillary  SpO2:  98%  95%  Weight:      Height:        Intake/Output Summary (Last 24 hours) at 06/03/2024 1530 Last data filed at 06/03/2024 0834 Gross per 24 hour  Intake 340 ml  Output 600 ml  Net -260 ml      05/27/2024   10:35 AM 05/23/2024   11:13 AM 07/09/2022    7:25 PM   Last 3 Weights  Weight (lbs) 320 lb 230 lb 249 lb 12.5 oz  Weight (kg) 145.151 kg 104.327 kg 113.3 kg      Telemetry Atrial fibrillation, rate 80-100 bpm, brief episodes up to 120s - Personally Reviewed  Physical Exam  GEN: No acute distress.   Neck: No JVD Cardiac: IRIR, no murmurs, rubs, or gallops.  Respiratory: Clear to auscultation bilaterally. GI: Soft, nontender, non-distended  MS: No edema; No deformity. Neuro:  Nonfocal  Psych: Normal affect   Labs High Sensitivity Troponin:   Recent Labs  Lab 05/27/24 1141  TROPONINIHS 8     Chemistry Recent Labs  Lab 05/28/24 0439 05/29/24 0606 05/30/24 0415 05/31/24 0521 06/01/24 0606 06/03/24 0518 06/03/24 0837 06/03/24 1344  NA 137 139 137 135   < > 137 138 137  K 4.7 4.6 5.1 4.5   < > 6.0* 6.0* 5.4*  CL 111 109 106 103   < > 104 107 104  CO2 20* 23 23 28    < > 25 23 23   GLUCOSE 91 94 96 99   < > 90 93 129*  BUN 42* 52* 45* 51*   < > 45* 45* 47*  CREATININE 2.22* 2.24* 2.32* 2.30*   < > 2.29* 2.21* 2.20*  CALCIUM  9.3 9.5 9.9 9.4   < > 10.2 9.9 10.2  MG  --  2.6* 2.4 2.4  --   --   --   --   PROT 6.2*  --   --   --   --   --   --   --   ALBUMIN 2.7*  --   --   --   --   --   --   --   AST 12*  --   --   --   --   --   --   --   ALT 12  --   --   --   --   --   --   --   ALKPHOS 71  --   --   --   --   --   --   --   BILITOT 0.7  --   --   --   --   --   --   --   GFRNONAA 30* 30* 29* 29*   < > 29* 30* 31*  ANIONGAP 6 7 8  4*   < > 8 8 10    < > = values in this interval not displayed.    Lipids No results for input(s): CHOL, TRIG, HDL, LABVLDL, LDLCALC, CHOLHDL in the last 168 hours.  Hematology Recent Labs  Lab 05/28/24 0439 06/03/24 0518  WBC 6.1 5.7  RBC 4.00* 4.33  HGB 11.2* 12.0*  HCT 37.0* 40.3  MCV 92.5 93.1  MCH 28.0 27.7  MCHC 30.3 29.8*  RDW 17.3* 16.1*  PLT 246 268   Thyroid   No results for input(s): TSH, FREET4 in the last 168 hours.   BNP Recent Labs  Lab  05/28/24 0435 05/29/24 1109  BNP 158.5* 287.6*    DDimer No results for input(s): DDIMER in the last 168 hours.   Radiology  CT ANGIO HEAD NECK W WO CM (CODE STROKE) Result Date: 06/03/2024 IMPRESSION: 1. No large vessel occlusion, hemodynamically significant stenosis, or aneurysm in the head or neck. 2. Mild atherosclerosis as above. Electronically signed by: Donnice Mania MD 06/03/2024 03:08 PM EDT RP Workstation: [id number]   CT HEAD CODE STROKE WO CONTRAST Result Date: 06/03/2024 IMPRESSION: 1. No acute intracranial abnormality. ASPECTS of 10. 2. Mild chronic small vessel ischemic disease. 3. Persistent large bilateral mastoid and right middle ear effusions. Electronically signed by: Dasie Hamburg MD 06/03/2024 02:46 PM EDT RP Workstation: [id number]   DG Chest Port 1 View Result Date: 06/02/2024 IMPRESSION: 1. Worsening left lung base aeration with increasing opacity and possible effusion. 2. Mild improvement in right lung base opacity. Electronically Signed   By: Andrea Gasman M.D.   On: 06/02/2024 15:39    Cardiac Studies  2D echo 05/27/2024: 1. Left ventricular ejection fraction, by estimation, is 35 to 40%. The  left ventricle has moderately decreased function. The left ventricle  demonstrates global hypokinesis. Left ventricular diastolic parameters are  indeterminate.   2. Right ventricular systolic function is normal. The right ventricular  size is normal. There is normal pulmonary artery systolic pressure. The  estimated right ventricular systolic pressure is 31.0 mmHg.   3. The mitral valve is normal in structure. Mild mitral valve  regurgitation. No evidence of mitral stenosis.   4. The aortic valve is normal in structure. Aortic valve regurgitation is  not visualized. No aortic stenosis is present.  5. The inferior vena cava is normal in size with greater than 50%  respiratory variability, suggesting right atrial pressure of 3 mmHg.   Patient Profile   73  y.o. male with history of atrial fibrillation, nonischemic cardiomyopathy, hypertension, obstructive lung disease with chronic respiratory failure with hypoxia, CKD stage IV, and obstructive sleep apnea not on CPAP who is being seen for the ongoing management of atrial fibrillation with RVR.  Assessment & Plan   Persistent atrial fibrillation - Hospitalization 10/2021 at which time he completed TEE cardioversion to restore sinus rhythm, subsequently lost to follow-up - Presented for cataract surgery 10/14 found to be in atrial fibrillation with RVR - Medication noncompliance (out of medications) - Was transitioned 10/19 from carvedilol to metoprolol  succinate 25 mg daily for rate control and in the setting of underlying pulmonary disease and relative hypotension - Continue Eliquis  5 mg twice daily - Not an ideal candidate for amiodarone  given he has not been anticoagulated and with underlying thyroid  disease.  Not a good candidate for digoxin given renal dysfunction. - He remains in atrial fibrillation with rate well-controlled 80 to 100 bpm on average - Given her ongoing dyspnea and wheezing, will defer TEE/DCCV today.  We would ideally restore sinus rhythm prior to discharge but could also consider anticoagulation for 3 weeks prior to DCCV as outpatient without TEE if rates remain well-controlled.  HFrEF Nonischemic cardiomyopathy - Prior catheterization 10/2018 with no significant CAD - Echo this admission with EF 35 to 40%, likely tachycardia mediated cardiomyopathy - Continue metoprolol  succinate as above - Relative hypotension and renal dysfunction Limited initiation of ACE/ARB/ARNI/MRA - Will plan to escalate GDMT as/if able - Appears relatively euvolemic on exam, continue to hold IV Lasix  - Aim to maintain slightly net negative volume status - Best option for his cardiomyopathy would be to restore normal sinus rhythm as detailed above  Chronic respiratory distress COPD - Prior  history of smoking - Likely component of pulmonary edema in the setting of atrial fibrillation with RVR, s/p IV diuresis - Ongoing management per IM  Hypothyroidism  - On levothyroxine as outpatient  CKD IV - Overall stable, baseline creatinine appears to be 2.2 dating back several years   For questions or updates, please contact [facility] Please consult [email or web address] for contact info under       Signed, Lesley LITTIE Maffucci, PA-C  06/03/2024, 3:30 PM

## 2024-06-03 NOTE — Progress Notes (Signed)
 Progress Note   Patient: Matthew [name] FMW:[id number] DOB: 02-Aug-1951 DOA: 05/27/2024     5 DOS: the patient was seen and examined on 06/03/2024   Brief hospital course:  H&P HPI  Matthew [name] is a pleasant 73 y.o. male with medical history significant for A-fib on Eliquis , systolic chronic congestive heart failure on Lasix , CAD, bipolar disorder, depression, hypothyroidism, history of CVA who was sent over from eye surgery center after being found to be in A-fib with RVR.  Patient denies any symptoms.  Patient denies any chest pain, shortness of breath, palpitations.  He denies any nausea vomiting fever chills or any other sick feeling.   ED Course: Upon arrival to the ED, patient is found to be A-fib with RVR.  Patient was given 10 mg IV diltiazem  by EMS.  Patient was started on diltiazem  drip in the emergency room.  Hospitalist service was consulted for evaluation for admission.   Further hospital course and management as outlined below.  Assessment and Plan:  Persistent Atrial fibrillation  Presented in afib with RVR. Initially on cardizem  infusion. Started on coreg 6.25 mg BID with improved rate control. Rates are improved with IV diuresis.  Further titration of his medications limited by blood pressures.  Per cardiology, due to medication nonadherence not appropriate for cardioversion - Cardiology following - Continue with metop - Telemetry monitoring - Maintain K>4, Mg>2 - Cardioversion had been planned when respiratory status allows   HFrEF NICM  Likely tachycardia mediated cardiomyopathy.  TTE EF 35 to 40-, global hypokinesis, mild MR.  Dyspnea and volume status improved with IV diuresis - Cardiology following - Holding IV Lasix   -Monitor I&O and daily BMP  Hyperkalemia New today, at risk for this given ckd but no clear trigger. No worrisome EKG changes - lasix , lokelma , trend  COPD with acute exacerbation 10/20: Patient continues wheezing despite getting scheduled  nebulizers. Likely triggered by rsv infection --continue prednisone  40 mg daily --Continue scheduled duonebs --continue ceftriaxone , suspect infiltrates are 2/2 rsv but will help with copd exacerbation and bacterial pna if present --Continue home inhaler, reportedly on wixela   Rhinovirus + Cxr with likely atelectasis, some fluid. Having some chest congestion.  Continue mucinex  DM.  Tussionex as needed Neb treatments as above  Anxiety depression Continue home psychotropic medications   Hypothyroidism  TSH WNL. Continue home synthroid    Normocytic anemia  Hgb stable.  Continue iron supplementation    CKD stage IV Appears to be around baseline.  Continue to monitor with BMP.  Avoid nephrotoxins and renally dose meds   OSA Nonadherent with CPAP outpatient.  CPAP nightly while admitted   History of CVA CAD LHC 10/2018 with normal coronary arteries.    Morbid Obesity BMI 41  Complicates overall care and prognosis   Cognitive impairment States he has trouble remembering things. He states that his wife is no longer living. The number listed for her in the chart is disconnected.    ?Housing insecurity -Will ask TOC to look into this   Debility Plan for snf      Subjective: reports feeling not well, breathing still bothering him, cough not productive   Physical Exam: Vitals:   06/02/24 2300 06/03/24 0027 06/03/24 0400 06/03/24 0834  BP:  (!) 126/93  118/64  Pulse: 82 93  73  Resp: (!) 23 18  20   Temp:  98 F (36.7 C) 97.9 F (36.6 C) 97.6 F (36.4 C)  TempSrc:  Oral Oral Axillary  SpO2: 96%  95%  98%  Weight:      Height:       General exam: awake, alert, no acute distress, chronically ill-appearing HEENT: moist mucus membranes, hearing grossly normal, poor dentition Respiratory system: Lung sounds diminished with faint expiratory wheezes, scattered rhonchi Cardiovascular system: normal S1/S2, RRR, no peripheral edema Gastrointestinal system: soft, NT,  ND Central nervous system: A&O x3. no gross focal neurologic deficits, normal speech Extremities: moves all , no edema, normal tone Psychiatry: normal mood, congruent affect   Data Reviewed: No new labs today Notable labs 10/19: BUN 48 Creatinine 2.30 >> 2.23   Family Communication: none at bedside   Disposition: Status is: Inpatient Remains inpatient appropriate because: Severity of illness as above, cardioversion is planned.  Not yet cleared by consultants for discharge.     Planned Discharge Destination: Skilled nursing facility     Author: Devaughn KATHEE Ban, MD 06/03/2024 10:16 AM  For on call review [email or web address].

## 2024-06-03 NOTE — Progress Notes (Signed)
 PT Cancellation Note  Patient Details Name: Matthew [name] MRN: [id number] DOB: [birth year]   Cancelled Treatment:     Upon entering room for PT/OT session, nursing in process of calling a code stroke. Stroke team arrived to assess, and pt sent off floor for CT Scan. Will reassess next available date per POC.   [name] 06/03/2024, 2:58 PM

## 2024-06-04 ENCOUNTER — Inpatient Hospital Stay

## 2024-06-04 DIAGNOSIS — B348 Other viral infections of unspecified site: Secondary | ICD-10-CM

## 2024-06-04 DIAGNOSIS — N184 Chronic kidney disease, stage 4 (severe): Secondary | ICD-10-CM | POA: Diagnosis not present

## 2024-06-04 DIAGNOSIS — J432 Centrilobular emphysema: Secondary | ICD-10-CM | POA: Diagnosis not present

## 2024-06-04 DIAGNOSIS — I4811 Longstanding persistent atrial fibrillation: Secondary | ICD-10-CM | POA: Diagnosis not present

## 2024-06-04 DIAGNOSIS — I4891 Unspecified atrial fibrillation: Secondary | ICD-10-CM | POA: Diagnosis not present

## 2024-06-04 DIAGNOSIS — I420 Dilated cardiomyopathy: Secondary | ICD-10-CM

## 2024-06-04 DIAGNOSIS — F0390 Unspecified dementia without behavioral disturbance: Secondary | ICD-10-CM

## 2024-06-04 LAB — CBC
HCT: 38.7 % — ABNORMAL LOW (ref 39.0–52.0)
Hemoglobin: 11.7 g/dL — ABNORMAL LOW (ref 13.0–17.0)
MCH: 28.1 pg (ref 26.0–34.0)
MCHC: 30.2 g/dL (ref 30.0–36.0)
MCV: 93 fL (ref 80.0–100.0)
Platelets: 273 K/uL (ref 150–400)
RBC: 4.16 MIL/uL — ABNORMAL LOW (ref 4.22–5.81)
RDW: 16.3 % — ABNORMAL HIGH (ref 11.5–15.5)
WBC: 6.5 K/uL (ref 4.0–10.5)
nRBC: 0 % (ref 0.0–0.2)

## 2024-06-04 LAB — TROPONIN I (HIGH SENSITIVITY)
Troponin I (High Sensitivity): 5 ng/L (ref <18)
Troponin I (High Sensitivity): 5 ng/L (ref <18)

## 2024-06-04 LAB — BASIC METABOLIC PANEL WITH GFR
Anion gap: 6 (ref 5–15)
BUN: 46 mg/dL — ABNORMAL HIGH (ref 8–23)
CO2: 25 mmol/L (ref 22–32)
Calcium: 9.7 mg/dL (ref 8.9–10.3)
Chloride: 106 mmol/L (ref 98–111)
Creatinine, Ser: 2.12 mg/dL — ABNORMAL HIGH (ref 0.61–1.24)
GFR, Estimated: 32 mL/min — ABNORMAL LOW (ref >60)
Glucose, Bld: 115 mg/dL — ABNORMAL HIGH (ref 70–99)
Potassium: 5.5 mmol/L — ABNORMAL HIGH (ref 3.5–5.1)
Sodium: 137 mmol/L (ref 135–145)

## 2024-06-04 LAB — POTASSIUM: Potassium: 4.5 mmol/L (ref 3.5–5.1)

## 2024-06-04 LAB — GLUCOSE, CAPILLARY: Glucose-Capillary: 117 mg/dL — ABNORMAL HIGH (ref 70–99)

## 2024-06-04 MED ORDER — SODIUM ZIRCONIUM CYCLOSILICATE 5 G PO PACK
10.0000 g | PACK | Freq: Once | ORAL | Status: AC
Start: 2024-06-04 — End: 2024-06-04
  Administered 2024-06-04: 10 g via ORAL
  Filled 2024-06-04: qty 2

## 2024-06-04 MED ORDER — NITROGLYCERIN 0.4 MG SL SUBL
0.4000 mg | SUBLINGUAL_TABLET | SUBLINGUAL | Status: DC | PRN
  Administered 2024-06-04: 0.4 mg via SUBLINGUAL
  Filled 2024-06-04: qty 1

## 2024-06-04 MED ORDER — INSULIN ASPART 100 UNIT/ML IV SOLN
5.0000 [IU] | Freq: Once | INTRAVENOUS | Status: AC
  Administered 2024-06-04: 5 [IU] via INTRAVENOUS
  Filled 2024-06-04 (×2): qty 0.05

## 2024-06-04 MED ORDER — FUROSEMIDE 20 MG PO TABS
20.0000 mg | ORAL_TABLET | Freq: Every day | ORAL | Status: DC
  Administered 2024-06-04 – 2024-06-06 (×3): 20 mg via ORAL
  Filled 2024-06-04 (×3): qty 1

## 2024-06-04 MED ORDER — SPIRONOLACTONE 12.5 MG HALF TABLET
12.5000 mg | ORAL_TABLET | Freq: Every day | ORAL | Status: DC
  Filled 2024-06-04: qty 1

## 2024-06-04 MED ORDER — DEXTROSE 50 % IV SOLN
1.0000 | Freq: Once | INTRAVENOUS | Status: AC
  Administered 2024-06-04: 50 mL via INTRAVENOUS
  Filled 2024-06-04: qty 50

## 2024-06-04 MED ORDER — CHLORHEXIDINE GLUCONATE CLOTH 2 % EX PADS
6.0000 | MEDICATED_PAD | Freq: Every day | CUTANEOUS | Status: DC
  Administered 2024-06-04: 6 via TOPICAL

## 2024-06-04 NOTE — Progress Notes (Signed)
 Rounding Note   Patient Name: Matthew [name] Date of Encounter: 06/04/2024  [facility] Cardiologist: Lonni Hanson, MD   Subjective Patient reports feeling much better today with improvement in breathing and vision. He remains in atrial fibrillation which is rate controlled.   Scheduled Meds:  apixaban   5 mg Oral BID   busPIRone  15 mg Oral BID   calcitRIOL  0.25 mcg Oral Daily   Chlorhexidine  Gluconate Cloth  6 each Topical Daily   dextromethorphan-guaiFENesin   1 tablet Oral BID   doxycycline   100 mg Oral Q12H   ferrous sulfate  325 mg Oral Q M,W,F   fluticasone furoate-vilanterol  1 puff Inhalation Daily   ipratropium-albuterol   3 mL Nebulization BID   lamoTRIgine  50 mg Oral Daily   latanoprost  1 drop Both Eyes QHS   levothyroxine  25 mcg Oral Daily   metoprolol  succinate  50 mg Oral Daily   polyethylene glycol  17 g Oral Daily   prazosin  2 mg Oral QHS   predniSONE   40 mg Oral Q breakfast   senna-docusate  1 tablet Oral BID   sertraline  100 mg Oral Daily   sertraline  25 mg Oral Daily   traZODone   75 mg Oral QHS   Continuous Infusions:  sodium chloride  100 mL/hr at 06/04/24 0829   cefTRIAXone  (ROCEPHIN )  IV Stopped (06/03/24 1722)   PRN Meds: acetaminophen  **OR** acetaminophen , artificial tears, bisacodyl , bisacodyl , chlorpheniramine-HYDROcodone , nitroGLYCERIN, ondansetron  **OR** ondansetron  (ZOFRAN ) IV, mouth rinse, oxyCODONE    Vital Signs  Vitals:   06/04/24 0900 06/04/24 0954 06/04/24 1000 06/04/24 1016  BP:  137/63    Pulse: 81 (!) 116    Resp: 17  (!) 23 (!) 23  Temp:      TempSrc:      SpO2: 94%     Weight:      Height:        Intake/Output Summary (Last 24 hours) at 06/04/2024 1119 Last data filed at 06/04/2024 1000 Gross per 24 hour  Intake 1677.24 ml  Output 500 ml  Net 1177.24 ml      06/04/2024    4:44 AM 05/27/2024   10:35 AM 05/23/2024   11:13 AM  Last 3 Weights  Weight (lbs) 254 lb 13.6 oz 320 lb 230 lb  Weight  (kg) 115.6 kg 145.151 kg 104.327 kg      Telemetry Atrial fibrillation, rate 70-100 bpm, brief episodes up to 110s - Personally Reviewed  Physical Exam  GEN: No acute distress.   Neck: No JVD Cardiac: IRIR, no murmurs, rubs, or gallops.  Respiratory: Diminished throughout GI: Soft, nontender, non-distended  MS: No edema; No deformity. Neuro:  Nonfocal  Psych: Normal affect   Labs High Sensitivity Troponin:   Recent Labs  Lab 05/27/24 1141 06/04/24 0705 06/04/24 0943  TROPONINIHS 8 5 5      Chemistry Recent Labs  Lab 05/29/24 0606 05/30/24 0415 05/31/24 0521 06/01/24 0606 06/03/24 0837 06/03/24 1344 06/04/24 0424 06/04/24 0943  NA 139 137 135   < > 138 137 137  --   K 4.6 5.1 4.5   < > 6.0* 5.4* 5.5* 4.5  CL 109 106 103   < > 107 104 106  --   CO2 23 23 28    < > 23 23 25   --   GLUCOSE 94 96 99   < > 93 129* 115*  --   BUN 52* 45* 51*   < > 45* 47* 46*  --  CREATININE 2.24* 2.32* 2.30*   < > 2.21* 2.20* 2.12*  --   CALCIUM  9.5 9.9 9.4   < > 9.9 10.2 9.7  --   MG 2.6* 2.4 2.4  --   --   --   --   --   GFRNONAA 30* 29* 29*   < > 30* 31* 32*  --   ANIONGAP 7 8 4*   < > 8 10 6   --    < > = values in this interval not displayed.    Lipids No results for input(s): CHOL, TRIG, HDL, LABVLDL, LDLCALC, CHOLHDL in the last 168 hours.  Hematology Recent Labs  Lab 06/03/24 0518 06/04/24 0424  WBC 5.7 6.5  RBC 4.33 4.16*  HGB 12.0* 11.7*  HCT 40.3 38.7*  MCV 93.1 93.0  MCH 27.7 28.1  MCHC 29.8* 30.2  RDW 16.1* 16.3*  PLT 268 273   Thyroid   No results for input(s): TSH, FREET4 in the last 168 hours.   BNP Recent Labs  Lab 05/29/24 1109  BNP 287.6*    DDimer No results for input(s): DDIMER in the last 168 hours.   Radiology  CT ANGIO HEAD NECK W WO CM (CODE STROKE) Result Date: 06/03/2024 IMPRESSION: 1. No large vessel occlusion, hemodynamically significant stenosis, or aneurysm in the head or neck. 2. Mild atherosclerosis as above.  Electronically signed by: Donnice Mania MD 06/03/2024 03:08 PM EDT RP Workstation: [id number]   CT HEAD CODE STROKE WO CONTRAST Result Date: 06/03/2024 IMPRESSION: 1. No acute intracranial abnormality. ASPECTS of 10. 2. Mild chronic small vessel ischemic disease. 3. Persistent large bilateral mastoid and right middle ear effusions. Electronically signed by: Dasie Hamburg MD 06/03/2024 02:46 PM EDT RP Workstation: [id number]   DG Chest Port 1 View Result Date: 06/02/2024 IMPRESSION: 1. Worsening left lung base aeration with increasing opacity and possible effusion. 2. Mild improvement in right lung base opacity. Electronically Signed   By: Andrea Gasman M.D.   On: 06/02/2024 15:39    Cardiac Studies  2D echo 05/27/2024: 1. Left ventricular ejection fraction, by estimation, is 35 to 40%. The  left ventricle has moderately decreased function. The left ventricle  demonstrates global hypokinesis. Left ventricular diastolic parameters are  indeterminate.   2. Right ventricular systolic function is normal. The right ventricular  size is normal. There is normal pulmonary artery systolic pressure. The  estimated right ventricular systolic pressure is 31.0 mmHg.   3. The mitral valve is normal in structure. Mild mitral valve  regurgitation. No evidence of mitral stenosis.   4. The aortic valve is normal in structure. Aortic valve regurgitation is  not visualized. No aortic stenosis is present.   5. The inferior vena cava is normal in size with greater than 50%  respiratory variability, suggesting right atrial pressure of 3 mmHg.   Patient Profile   73 y.o. male with history of atrial fibrillation, nonischemic cardiomyopathy, hypertension, obstructive lung disease with chronic respiratory failure with hypoxia, CKD stage IV, and obstructive sleep apnea not on CPAP who is being seen for the ongoing management of atrial fibrillation with RVR.  Assessment & Plan   Persistent atrial fibrillation -  Hospitalization 10/2021 at which time he completed TEE cardioversion to restore sinus rhythm, subsequently lost to follow-up - Presented for cataract surgery 10/14 found to be in atrial fibrillation with RVR - Medication noncompliance (out of medications) - Was transitioned 10/19 from carvedilol to metoprolol  succinate for rate control and in the setting of underlying pulmonary  disease and relative hypotension - Continue Eliquis  5 mg twice daily as long as there is no contraindication from a neurologic standpoint - Not an ideal candidate for amiodarone  given he has not been anticoagulated and with underlying thyroid  disease.  Not a good candidate for digoxin given renal dysfunction. - He remains in atrial fibrillation with rate well-controlled 70 to 100 bpm on average - Continue metoprolol  succinate 50 mg daily  - We would ideally restore sinus rhythm prior to discharge but could also consider anticoagulation for 3 weeks prior to DCCV as outpatient without TEE if rates remain well-controlled. Stroke like symptoms will need to be workup up prior to pursuing non emergent DCCV.   HFrEF Nonischemic cardiomyopathy - Prior catheterization 10/2018 with no significant CAD - Echo this admission with EF 35 to 40%, likely tachycardia mediated cardiomyopathy - Continue metoprolol  succinate as above - Relative hypotension and renal dysfunction Limited initiation of ACE/ARB/ARNI/MRA - Will plan to escalate GDMT as/if able - Appears relatively euvolemic on exam, continue to hold IV Lasix  - Aim to maintain slightly net negative volume status - Best option for his cardiomyopathy would be to restore normal sinus rhythm as detailed above  Chronic respiratory distress COPD - Prior history of smoking - Likely component of pulmonary edema in the setting of atrial fibrillation with RVR, s/p IV diuresis - Ongoing management per IM  ?Stroke - Code stroke called yesterday afternoon for abnormal vision and unequal  pupils - CTA without acute abnormality - Patient apparently has shrapnel in his abdomen and was unable to have MRI - Patient is symptomatically back to baseline - Ongoing management per neurology  Hypothyroidism  - On levothyroxine as outpatient  CKD IV - Overall stable, baseline creatinine appears to be 2.2 dating back several years   For questions or updates, please contact [facility] Please consult [email or web address] for contact info under       Signed, Lesley LITTIE Maffucci, PA-C  06/04/2024, 11:19 AM

## 2024-06-04 NOTE — Progress Notes (Signed)
 Occupational Therapy Re-evaluation  Patient Details Name: Matthew [name] MRN: [id number] DOB: 1951/02/05 Today's Date: 06/04/2024   History of present illness Matthew [name] is a pleasant 73 y.o. male with medical history significant for A-fib on Eliquis , systolic chronic congestive heart failure on Lasix , CAD, bipolar disorder, depression, hypothyroidism, history of CVA who was sent over from eye surgery center after being found to be in A-fib with RVR.  Hospital course additionally significant for rapid response due to isolated anisicoria and visual field changes; transferred to CCU for neuro work-up. Initial imaging negative for acute change.   OT comments  Chart reviewed to date, pt greeted sitting on edge of bed with PT present. Pt is oriented to self and place, ?problem solving deficits and pt reports increased difficulties with STM recently, will continue to assess. Pt continues to present with deficits in strength, endurance, activity tolerance, balance, cognition, affecting safe and optimal ADL completion. MIN A +2 with RW required for STS and step pivot transfer to bedside chair. Supervision for seated grooming tasks. Pt reports his vision feels unchanged, some blurriness but per chart was at eye center for cataracts surgery prior to this hospital admission, will continue to assess. HR to 130s bpm with mobility attempts, spo2 to 80s% on 2L via ; HR in 100s bpm and spo2 >90% on 2 L with seated rest break in chair. Pt continues to perform ADL/functional mobility below PLOF, will benefit from acute OT to address deficits and to facilitate return to PLOF. Pt is left in bedside chair, all needs met. OT will follow.       If plan is discharge home, recommend the following:  A lot of help with walking and/or transfers;A lot of help with bathing/dressing/bathroom;Supervision due to cognitive status   Equipment Recommendations  Other (comment) (defer to next venue of care)    Recommendations for  Other Services      Precautions / Restrictions Precautions Precautions: Fall Recall of Precautions/Restrictions: Impaired Restrictions Weight Bearing Restrictions Per Provider Order: No       Mobility Bed Mobility Overal bed mobility: Needs Assistance Bed Mobility NT in sitting with PT when OT entered room             Transfers Overall transfer level: Needs assistance Equipment used: Rolling walker (2 wheels) Transfers: Sit to/from Stand Sit to Stand: Min assist, +2 physical assistance                 Balance Overall balance assessment: Needs assistance Sitting-balance support: No upper extremity supported, Feet supported Sitting balance-Leahy Scale: Good     Standing balance support: Bilateral upper extremity supported Standing balance-Leahy Scale: Poor                             ADL either performed or assessed with clinical judgement   ADL Overall ADL's : Needs assistance/impaired     Grooming: Supervision/safety;Wash/dry face;Oral care;Sitting                   Toilet Transfer: Minimal assistance;Rolling walker (2 wheels);+2 for physical assistance;+2 for safety/equipment Toilet Transfer Details (indicate cue type and reason): frequent verbal cues for technique                Extremity/Trunk Assessment Upper Extremity Assessment Upper Extremity Assessment: Generalized weakness (B tingling from elbow to hands, improves with mobility attempts)   Lower Extremity Assessment Lower Extremity Assessment: Defer to PT evaluation  Vision Baseline Vision/History: 4 Cataracts Patient Visual Report: No change from baseline     Perception     Praxis     Communication Communication Communication: No apparent difficulties   Cognition Arousal: Alert Behavior During Therapy: WFL for tasks assessed/performed Cognition: No family/caregiver present to determine baseline, Cognition impaired   Orientation impairments:  Situation   Memory impairment (select all impairments): Declarative long-term memory, Short-term memory, Working memory Attention impairment (select first level of impairment): Selective attention Executive functioning impairment (select all impairments): Problem solving, Reasoning                   Following commands: Intact        Cueing   Cueing Techniques: Verbal cues, Gestural cues  Exercises Other Exercises Other Exercises: edu re role of OT, role of rehab, discharge recommendations, importance of  continued mobility attempts    Shoulder Instructions       General Comments HR to 130s bpm with transfer to chair, in 100s bpm in chair post session; spo2 to high 80s% on 2L via [place] during mobility, >90% on 2 L via [place] in chair    Pertinent Vitals/ Pain       Pain Assessment Pain Assessment: Faces Faces Pain Scale: Hurts a little bit Pain Location: generalized soreness all over Pain Descriptors / Indicators: Sore Pain Intervention(s): Monitored during session, Repositioned, Limited activity within patient's tolerance  Home Living Family/patient expects to be discharged to:: Private residence Living Arrangements: Alone   Type of Home: Mobile home Home Access: Stairs to enter Secretary/administrator of Steps: 4 Entrance Stairs-Rails: Right;Left;Can reach both Home Layout: One level               Home Equipment: Agricultural consultant (2 wheels);BSC/3in1;Wheelchair - manual;Cane - single point          Prior Functioning/Environment              Frequency  Min 2X/week        Progress Toward Goals  OT Goals(current goals can now be found in the care plan section)  Progress towards OT goals: Progressing toward goals  Acute Rehab OT Goals Time For Goal Achievement: 06/18/24  Plan      Co-evaluation    PT/OT/SLP Co-Evaluation/Treatment: Yes Reason for Co-Treatment: To address functional/ADL transfers   OT goals addressed during session: ADL's and  self-care      AM-PAC OT 6 Clicks Daily Activity     Outcome Measure   Help from another person eating meals?: None Help from another person taking care of personal grooming?: None Help from another person toileting, which includes using toliet, bedpan, or urinal?: A Little Help from another person bathing (including washing, rinsing, drying)?: A Little Help from another person to put on and taking off regular upper body clothing?: A Little Help from another person to put on and taking off regular lower body clothing?: A Lot 6 Click Score: 19    End of Session Equipment Utilized During Treatment: Oxygen ;Rolling walker (2 wheels)  OT Visit Diagnosis: Muscle weakness (generalized) (M62.81)   Activity Tolerance Patient tolerated treatment well   Patient Left in chair;with call bell/phone within reach;with chair alarm set;with nursing/sitter in room   Nurse Communication Mobility status        Time: 1354-1415 OT Time Calculation (min): 21 min  Charges: OT General Charges $OT Visit: 1 Visit OT Evaluation $OT Re-eval: 1 Re-eval Therisa Sheffield, OTD OTR/L  06/04/24, 3:32 PM

## 2024-06-04 NOTE — Progress Notes (Signed)
 Progress Note   Patient: Matthew [name] FMW:[id number] DOB: [birth year] DOA: 05/27/2024     6 DOS: the patient was seen and examined on 06/04/2024   Brief hospital course:  H&P HPI  Matthew [name] is a pleasant 73 y.o. male with medical history significant for A-fib on Eliquis , systolic chronic congestive heart failure on Lasix , CAD, bipolar disorder, depression, hypothyroidism, history of CVA who was sent over from eye surgery center after being found to be in A-fib with RVR.  Patient denies any symptoms.  Patient denies any chest pain, shortness of breath, palpitations.  He denies any nausea vomiting fever chills or any other sick feeling.   ED Course: Upon arrival to the ED, patient is found to be A-fib with RVR.  Patient was given 10 mg IV diltiazem  by EMS.  Patient was started on diltiazem  drip in the emergency room.  Hospitalist service was consulted for evaluation for admission.   Further hospital course and management as outlined below.  Assessment and Plan:  Persistent Atrial fibrillation  Presented in afib with RVR. Initially on cardizem  infusion. Started on coreg 6.25 mg BID with improved rate control. Rates are improved with IV diuresis.  Further titration of his medications limited by blood pressures.  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  TTE EF 35 to 40-, global hypokinesis, mild MR.  Dyspnea and volume status improved with IV diuresis - Cardiology following - oral lasix  ordered today -Monitor I&O and daily BMP  Hyperkalemia persistent - lasix , lokelma , insulin , trend  COPD with acute exacerbation improving --continue prednisone  40 mg daily --Continue scheduled duonebs --continue ceftriaxone , suspect infiltrates are 2/2 rsv but will help with copd  exacerbation and bacterial pna if present --Continue home inhaler, reportedly on wixela   Rhinovirus + Cxr with likely atelectasis, some fluid. Having some chest congestion.  Continue mucinex  DM.  Tussionex as needed Neb treatments as above  Anxiety depression Continue home psychotropic medications   Hypothyroidism  TSH WNL. Continue home synthroid    Normocytic anemia  Hgb stable.  Continue iron supplementation    CKD stage IV Appears to be around baseline.  Continue to monitor with BMP. Did get contrast on 10/21 Avoid nephrotoxins and renally dose meds   OSA Nonadherent with CPAP outpatient.  CPAP nightly while admitted   History of CVA CAD LHC 10/2018 with normal coronary arteries.    Morbid Obesity BMI 41  Complicates overall care and prognosis   Cognitive impairment States he has trouble remembering things. He states that his wife is no longer living. The number listed for her in the chart is disconnected.   Debility Resides at [facility] for snf      Subjective: reports feeling a bit better, no chest pain, breathing comfortably   Physical Exam: Vitals:   06/04/24 1000 06/04/24 1016 06/04/24 1100 06/04/24 1200  BP:    114/89  Pulse:    76  Resp: (!) 23 (!) 23 17 17   Temp:      TempSrc:      SpO2:    95%  Weight:      Height:       General exam: awake, alert, no acute distress, chronically ill-appearing HEENT: moist mucus membranes, hearing grossly normal, poor dentition Respiratory system: Lung sounds diminished but clear with basilar rales Cardiovascular system: normal  S1/S2, RRR, no peripheral edema Gastrointestinal system: soft, NT, ND Central nervous system: A&O x3. no gross focal neurologic deficits, normal speech Extremities: moves all , no edema, normal tone Psychiatry: normal mood, congruent affect   Data Reviewed: Labs today   Family Communication: daughter telephonically 10/22   Disposition: Status is: Inpatient Remains  inpatient appropriate because: Severity of illness   Not yet cleared by consultants for discharge.     Planned Discharge Destination: Skilled nursing facility     Author: Devaughn KATHEE Ban, MD 06/04/2024 12:39 PM  For on call review [email or web address].

## 2024-06-04 NOTE — Evaluation (Signed)
 Physical Therapy Evaluation Patient Details Name: Matthew [name] MRN: [id number] DOB: September 30, 1950 Today's Date: 06/04/2024  History of Present Illness  Matthew [name] is a pleasant 73 y.o. male with medical history significant for A-fib on Eliquis , systolic chronic congestive heart failure on Lasix , CAD, bipolar disorder, depression, hypothyroidism, history of CVA who was sent over from eye surgery center after being found to be in A-fib with RVR.  Hospital course additionally significant for rapid response due to isolated anisicoria and visual field changes; transferred to CCU for neuro work-up. Initial imaging negative for acute change.  Clinical Impression  Patient resting in bed upon arrival to room; alert and oriented to basic information (self, location and general situation), but generally confused to more complex information.  Does follow simple, verbal commands, but requires occasional cuing for redirection to task and appropriateness of conversation at times. Endorses generally blurred vision, like I'm looking through a fog (relates this has been present prior to admission), but denies acute change in vision or perception.  No additional neurological deficits appreciated. Bilat UE/LE strength and ROM grossly symmetrical and WFL; no focal weakness appreciated.  Endorses generalized paresthesia (neuropathy?) bilat hands and feet (up to knees), but generally symmetrical and unchanged with this admission. Currently requiring close sup for bed mobility; min assist +2 for sit/stand, standing balance and bed/chair transfer with RW.  Requires cuing for walker position and management; short, shuffling steps. Limited balance reactions, limited activity tolerance.  Notably fatigued with exertion--HR 130s, sats 80s (2L).  Recovers to baseline 100s and >90% SaO2 within 30-45 seconds of seated rest. Would benefit from skilled PT to address above deficits and promote optimal return to PLOF.; recommend  post-acute PT follow up as indicated by interdisciplinary care team.        If plan is discharge home, recommend the following: Two people to help with walking and/or transfers;A lot of help with bathing/dressing/bathroom   Can travel by private vehicle        Equipment Recommendations    Recommendations for Other Services       Functional Status Assessment Patient has had a recent decline in their functional status and demonstrates the ability to make significant improvements in function in a reasonable and predictable amount of time.     Precautions / Restrictions Precautions Precautions: Fall Restrictions Weight Bearing Restrictions Per Provider Order: No      Mobility  Bed Mobility Overal bed mobility: Needs Assistance Bed Mobility: Supine to Sit     Supine to sit: Supervision          Transfers Overall transfer level: Needs assistance Equipment used: Rolling walker (2 wheels) Transfers: Sit to/from Stand, Bed to chair/wheelchair/BSC Sit to Stand: Min assist, +2 physical assistance Stand pivot transfers: Min assist, +2 physical assistance         General transfer comment: cuing for walker position and management; short, shuffling steps. Limited balance reactions, limited activity tolerance.    Ambulation/Gait               General Gait Details: deferred this date due to generalized fatigue, SOB with transfer and initial OOB efforts  Stairs            Wheelchair Mobility     Tilt Bed    Modified Rankin (Stroke Patients Only)       Balance Overall balance assessment: Needs assistance Sitting-balance support: No upper extremity supported, Feet supported Sitting balance-Leahy Scale: Good     Standing balance support: Bilateral upper  extremity supported Standing balance-Leahy Scale: Poor                               Pertinent Vitals/Pain Pain Assessment Pain Assessment: Faces Faces Pain Scale: Hurts a little  bit Pain Location: generalized soreness all over Pain Descriptors / Indicators: Sore Pain Intervention(s): Limited activity within patient's tolerance, Monitored during session, Repositioned    Home Living Family/patient expects to be discharged to:: Private residence Living Arrangements: Alone   Type of Home: Mobile home Home Access: Stairs to enter Entrance Stairs-Rails: Right;Left;Can reach both Entrance Stairs-Number of Steps: 4   Home Layout: One level Home Equipment: Agricultural consultant (2 wheels);BSC/3in1;Wheelchair - manual;Cane - single point      Prior Function Prior Level of Function : Patient poor historian/Family not available             Mobility Comments: Per patient, ambulatory with SPC for household and limited community mobilization; denies home O2.  Does endorse fall history, though difficult to quantify ADLs Comments: Per patient, mod indep with ADLs, household responsibilities.  Does have friends/neighbors that support periodically as needed     Extremity/Trunk Assessment   Upper Extremity Assessment Upper Extremity Assessment: Generalized weakness (grossly at least 4-/5 throughout; no focal weakness)    Lower Extremity Assessment Lower Extremity Assessment: Generalized weakness (grossly at least 4-/5 throughout; no sensory deficits, coordination deficits)       Communication   Communication Communication: No apparent difficulties    Cognition Arousal: Alert     PT - Cognitive impairments: Problem solving, Safety/Judgement, Awareness                         Following commands: Intact       Cueing Cueing Techniques: Verbal cues, Gestural cues     General Comments      Exercises     Assessment/Plan    PT Assessment Patient needs continued PT services  PT Problem List Decreased strength;Decreased activity tolerance;Decreased balance;Decreased mobility;Decreased cognition;Decreased knowledge of use of DME;Decreased safety  awareness;Cardiopulmonary status limiting activity;Obesity;Pain       PT Treatment Interventions DME instruction;Gait training;Stair training;Functional mobility training;Therapeutic activities;Therapeutic exercise;Balance training;Cognitive remediation;Patient/family education    PT Goals (Current goals can be found in the Care Plan section)  Acute Rehab PT Goals Patient Stated Goal: to get up to the chair PT Goal Formulation: With patient Time For Goal Achievement: 06/18/24 Potential to Achieve Goals: Good    Frequency Min 2X/week     Co-evaluation               AM-PAC PT 6 Clicks Mobility  Outcome Measure Help needed turning from your back to your side while in a flat bed without using bedrails?: None Help needed moving from lying on your back to sitting on the side of a flat bed without using bedrails?: A Little Help needed moving to and from a bed to a chair (including a wheelchair)?: A Little Help needed standing up from a chair using your arms (e.g., wheelchair or bedside chair)?: A Lot Help needed to walk in hospital room?: A Lot Help needed climbing 3-5 steps with a railing? : A Lot 6 Click Score: 16    End of Session Equipment Utilized During Treatment: Gait belt;Oxygen  Activity Tolerance: Patient tolerated treatment well Patient left: in chair;with call bell/phone within reach;with chair alarm set Nurse Communication: Mobility status PT Visit Diagnosis: Unsteadiness on feet (  R26.81);Other abnormalities of gait and mobility (R26.89);Muscle weakness (generalized) (M62.81);Difficulty in walking, not elsewhere classified (R26.2);Pain    Time: 1345-1410 PT Time Calculation (min) (ACUTE ONLY): 25 min   Charges:   PT Evaluation $PT Re-evaluation: 1 Re-eval   PT General Charges $$ ACUTE PT VISIT: 1 Visit        Davarion Cuffee H. Delores, PT, DPT, NCS 06/04/24, 2:24 PM [phone]

## 2024-06-04 NOTE — TOC Progression Note (Signed)
 Transition of Care [facility]) - Progression Note    Patient Details  Name: Matthew [name] MRN: [id number] Date of Birth: 1951/06/03  [facility] CM/SW Contact  [name], LCSW Phone Number: 06/04/2024, 3:42 PM  Clinical Narrative:    Chart reviewed. I spoke with MD. And the patient is LTC at [facility] care. I called Massie and he confirmed and the patient is able to return tomorrow or Friday due to bed availability.    Expected Discharge Plan: Skilled Nursing Facility Barriers to Discharge: Continued Medical Work up               Expected Discharge Plan and Services     Post Acute Care Choice: Skilled Nursing Facility Living arrangements for the past 2 months: Mobile Home                                       Social Drivers of Health (SDOH) Interventions SDOH Screenings   Food Insecurity: No Food Insecurity (05/27/2024)  Housing: High Risk (05/27/2024)  Transportation Needs: No Transportation Needs (05/30/2024)  Utilities: Not At Risk (05/27/2024)  Social Connections: Moderately Integrated (05/27/2024)  Tobacco Use: Low Risk  (05/30/2024)    Readmission Risk Interventions    05/04/2022   10:42 AM 10/18/2021   12:39 PM  Readmission Risk Prevention Plan  Transportation Screening  Complete  HRI or Home Care Consult Complete   Palliative Care Screening Complete   Medication Review (RN Care Manager) Complete Complete  PCP or Specialist appointment within 3-5 days of discharge  Complete  HRI or Home Care Consult  Complete  SW Recovery Care/Counseling Consult  Complete  Palliative Care Screening  Not Applicable  Skilled Nursing Facility  Not Applicable

## 2024-06-04 NOTE — Progress Notes (Signed)
 Pt to and from CT with no issues. Pt back in ICU 13. VSS on 3L [place].

## 2024-06-04 NOTE — Progress Notes (Signed)
   06/04/24 1030  Spiritual Encounters  Type of Visit Initial  Care provided to: Patient  Referral source Chaplain team  Reason for visit Routine spiritual support  OnCall Visit No  Spiritual Framework  Presenting Themes Goals in life/care;Community and relationships;Impactful experiences and emotions;Significant life change  Community/Connection Limited  Patient Stress Factors Health changes;Major life changes;Family relationships;Loss  Interventions  Spiritual Care Interventions Made Established relationship of care and support;Compassionate presence;Reflective listening;Narrative/life review   Visited with patient per chaplain colleague request.  Talked with patient about health issues, family dynamics, past relationships with church, friendships remembered.  Patient very appreciative of visit, appreciative of care.

## 2024-06-04 NOTE — Progress Notes (Signed)
 Stroke code activated for isolated anisicoria LKW 1500. Patient has hx a fib on eliquis . CT head showed no ICH or acute process. CTA showed no pcomm aneurysm or other abnormality. Recommend MRI brain wwo however patient has shrapnel in abdomen therefore will have to check with MRI to see if there is any way we can do this. Full stroke code note to follow.  Matthew Ross, MD [facility] [phone]  If 7pm- 7am, please page neurology on call as listed in AMION.

## 2024-06-05 ENCOUNTER — Observation Stay: Admitting: Family

## 2024-06-05 DIAGNOSIS — N184 Chronic kidney disease, stage 4 (severe): Secondary | ICD-10-CM | POA: Diagnosis not present

## 2024-06-05 DIAGNOSIS — J449 Chronic obstructive pulmonary disease, unspecified: Secondary | ICD-10-CM | POA: Diagnosis not present

## 2024-06-05 DIAGNOSIS — I4811 Longstanding persistent atrial fibrillation: Secondary | ICD-10-CM | POA: Diagnosis not present

## 2024-06-05 DIAGNOSIS — I4819 Other persistent atrial fibrillation: Secondary | ICD-10-CM | POA: Diagnosis not present

## 2024-06-05 LAB — BASIC METABOLIC PANEL WITH GFR
Anion gap: 6 (ref 5–15)
BUN: 44 mg/dL — ABNORMAL HIGH (ref 8–23)
CO2: 21 mmol/L — ABNORMAL LOW (ref 22–32)
Calcium: 9.3 mg/dL (ref 8.9–10.3)
Chloride: 111 mmol/L (ref 98–111)
Creatinine, Ser: 2.35 mg/dL — ABNORMAL HIGH (ref 0.61–1.24)
GFR, Estimated: 28 mL/min — ABNORMAL LOW (ref >60)
Glucose, Bld: 87 mg/dL (ref 70–99)
Potassium: 4.9 mmol/L (ref 3.5–5.1)
Sodium: 138 mmol/L (ref 135–145)

## 2024-06-05 MED ORDER — AZITHROMYCIN 250 MG PO TABS
500.0000 mg | ORAL_TABLET | Freq: Every day | ORAL | Status: DC
Start: 2024-06-05 — End: 2024-06-07
  Administered 2024-06-05 – 2024-06-06 (×2): 500 mg via ORAL
  Filled 2024-06-05 (×2): qty 2

## 2024-06-05 MED ORDER — PREDNISONE 20 MG PO TABS
40.0000 mg | ORAL_TABLET | Freq: Every day | ORAL | Status: DC
  Administered 2024-06-06: 40 mg via ORAL
  Filled 2024-06-05: qty 2

## 2024-06-05 MED ORDER — METOPROLOL SUCCINATE ER 25 MG PO TB24
25.0000 mg | ORAL_TABLET | Freq: Every evening | ORAL | Status: DC
  Administered 2024-06-05 – 2024-06-06 (×2): 25 mg via ORAL
  Filled 2024-06-05 (×2): qty 1

## 2024-06-05 MED ORDER — DAPAGLIFLOZIN PROPANEDIOL 10 MG PO TABS
10.0000 mg | ORAL_TABLET | Freq: Every day | ORAL | Status: DC
  Administered 2024-06-05 – 2024-06-06 (×2): 10 mg via ORAL
  Filled 2024-06-05 (×2): qty 1

## 2024-06-05 NOTE — Progress Notes (Signed)
 Rounding Note   Patient Name: Matthew [name] Date of Encounter: 06/05/2024  [facility] Cardiologist: Lonni Hanson, MD   Subjective Pleasant, resting comfortably in bed Overall no complaints On morning rounds 11 AM  had not received his metoprolol  succinate 50, heart rate was trending higher up to 100 bpm at rest Discussed with nurses, they are still giving out morning medications Denies significant shortness of breath, no leg swelling  Scheduled Meds:  apixaban   5 mg Oral BID   azithromycin   500 mg Oral Daily   busPIRone  15 mg Oral BID   calcitRIOL  0.25 mcg Oral Daily   Chlorhexidine  Gluconate Cloth  6 each Topical Daily   dapagliflozin propanediol  10 mg Oral Daily   dextromethorphan-guaiFENesin   1 tablet Oral BID   doxycycline   100 mg Oral Q12H   ferrous sulfate  325 mg Oral Q M,W,F   fluticasone furoate-vilanterol  1 puff Inhalation Daily   furosemide   20 mg Oral Daily   ipratropium-albuterol   3 mL Nebulization BID   lamoTRIgine  50 mg Oral Daily   latanoprost  1 drop Both Eyes QHS   levothyroxine  25 mcg Oral Daily   metoprolol  succinate  50 mg Oral Daily   polyethylene glycol  17 g Oral Daily   prazosin  2 mg Oral QHS   [START ON 06/06/2024] predniSONE   40 mg Oral Q breakfast   senna-docusate  1 tablet Oral BID   sertraline  100 mg Oral Daily   sertraline  25 mg Oral Daily   traZODone   75 mg Oral QHS   Continuous Infusions:  PRN Meds: acetaminophen  **OR** acetaminophen , artificial tears, bisacodyl , bisacodyl , chlorpheniramine-HYDROcodone , nitroGLYCERIN, ondansetron  **OR** ondansetron  (ZOFRAN ) IV, mouth rinse, oxyCODONE    Vital Signs  Vitals:   06/05/24 0356 06/05/24 0742 06/05/24 1137 06/05/24 1519  BP: 120/87 106/80 118/68 124/78  Pulse: 74 78 87 64  Resp: 20 18 17 19   Temp: (!) 97.5 F (36.4 C) (!) 97.5 F (36.4 C) 97.8 F (36.6 C) 98.1 F (36.7 C)  TempSrc:  Oral Oral   SpO2: 98% 100% 93% 93%  Weight:      Height:         Intake/Output Summary (Last 24 hours) at 06/05/2024 1546 Last data filed at 06/05/2024 1300 Gross per 24 hour  Intake 736.86 ml  Output 1000 ml  Net -263.14 ml      06/04/2024    4:44 AM 05/27/2024   10:35 AM 05/23/2024   11:13 AM  Last 3 Weights  Weight (lbs) 254 lb 13.6 oz 320 lb 230 lb  Weight (kg) 115.6 kg 145.151 kg 104.327 kg      Telemetry Atrial fibrillation rate 90-100- Personally Reviewed  ECG   - Personally Reviewed  Physical Exam  GEN: No acute distress.   Neck: No JVD Cardiac: Irregularly irregular, no murmurs, rubs, or gallops.  Respiratory: Clear to auscultation bilaterally. GI: Soft, nontender, non-distended  MS: No edema; No deformity. Neuro:  Nonfocal  Psych: Normal affect   Labs High Sensitivity Troponin:   Recent Labs  Lab 05/27/24 1141 06/04/24 0705 06/04/24 0943  TROPONINIHS 8 5 5      Chemistry Recent Labs  Lab 05/30/24 0415 05/31/24 0521 06/01/24 0606 06/03/24 1344 06/04/24 0424 06/04/24 0943 06/05/24 0622  NA 137 135   < > 137 137  --  138  K 5.1 4.5   < > 5.4* 5.5* 4.5 4.9  CL 106 103   < > 104 106  --  111  CO2 23 28   < > 23 25  --  21*  GLUCOSE 96 99   < > 129* 115*  --  87  BUN 45* 51*   < > 47* 46*  --  44*  CREATININE 2.32* 2.30*   < > 2.20* 2.12*  --  2.35*  CALCIUM  9.9 9.4   < > 10.2 9.7  --  9.3  MG 2.4 2.4  --   --   --   --   --   GFRNONAA 29* 29*   < > 31* 32*  --  28*  ANIONGAP 8 4*   < > 10 6  --  6   < > = values in this interval not displayed.    Lipids No results for input(s): CHOL, TRIG, HDL, LABVLDL, LDLCALC, CHOLHDL in the last 168 hours.  Hematology Recent Labs  Lab 06/03/24 0518 06/04/24 0424  WBC 5.7 6.5  RBC 4.33 4.16*  HGB 12.0* 11.7*  HCT 40.3 38.7*  MCV 93.1 93.0  MCH 27.7 28.1  MCHC 29.8* 30.2  RDW 16.1* 16.3*  PLT 268 273   Thyroid  No results for input(s): TSH, FREET4 in the last 168 hours.  BNPNo results for input(s): BNP, PROBNP in the last 168 hours.   DDimer No results for input(s): DDIMER in the last 168 hours.   Radiology  CT HEAD WO CONTRAST ( ) Result Date: 06/04/2024 EXAM: CT HEAD WITHOUT CONTRAST 06/04/2024 03:04:49 PM TECHNIQUE: CT of the head was performed without the administration of intravenous contrast. Automated exposure control, iterative reconstruction, and/or weight based adjustment of the mA/kV was utilized to reduce the radiation dose to as low as reasonably achievable. COMPARISON: 06/03/2024 CLINICAL HISTORY: code stroke f/u. code stroke f/u FINDINGS: BRAIN AND VENTRICLES: No acute hemorrhage. No evidence of acute infarct. Sequela of mild chronic microvascular ischemic change. No hydrocephalus. No extra-axial collection. No mass effect or midline shift. ORBITS: No acute abnormality. SINUSES: No acute abnormality. SOFT TISSUES AND SKULL: Debris in right external auditory canal, likely cerumen. Bilateral mastoid and middle ear effusions. No skull fracture. IMPRESSION: 1. No acute intracranial abnormality. 2. Mild chronic microvascular ischemic change. 3. Bilateral mastoid and middle ear effusions. Electronically signed by: Donnice Mania MD 06/04/2024 03:26 PM EDT RP Workstation: [id number]    Cardiac Studies    Patient Profile   73 y.o. male with history of atrial fibrillation, nonischemic cardiomyopathy, hypertension, obstructive lung disease with chronic respiratory failure with hypoxia, CKD stage IV, and obstructive sleep apnea not on CPAP who is being seen for the ongoing management of atrial fibrillation with RVR.   Assessment & Plan  Persistent atrial fibrillation Lost to follow-up since March 2023 hospitalization at which time he completed TEE cardioversion to restore normal sinus rhythm Presenting for cataract surgery May 27, 2024 with atrial fibrillation with RVR History of medication noncompliance (out of his medications  - Initially started on carvedilol  with improved rate control  Since then transition  to metoprolol  succinate 50 daily Late on receiving his morning medications, heart rate still remaining mildly elevated in afternoon -Will add additional metoprolol  succinate 25 in the PM - Plan to continue Eliquis  5 twice daily -Not a ideal candidate for amiodarone  as he has not been anticoagulated, thyroid  disease - Not a good candidate for digoxin given renal dysfunction - Plan is for outpatient follow-up, confirm medication compliance, then arrange TEE cardioversion.  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 - This admission, echo detailing ejection fraction 35 to 40% - Continue metoprolol  succinate 50 daily extra 25 mg every afternoon - Low blood pressure and renal failure limiting initiation of ACE /ARB/ARNI - Continue Lasix  20 daily with close monitoring of renal function   Chronic respiratory distress Prior history of smoking/COPD Acute bronchitis with rhinovirus In the setting of atrial fibrillation with RVR, - On broad-spectrum antibiotics -Overall appears stable   Hypothyroidism On levothyroxine as outpatient   Anxiety/bipolar disorder On lamotrigine, buspirone   Chronic kidney disease stage IV Creatinine 2.2-2.3 dating back several years  Hyperkalemia Treated, potassium now 4.9 Avoid spironolactone  For questions or updates, please contact [facility] Please consult [email or web address] for contact info under      Signed, Oluwatamilore Starnes, MD  06/05/2024, 3:46 PM

## 2024-06-05 NOTE — TOC Progression Note (Addendum)
 Transition of Care [facility]) - Progression Note    Patient Details  Name: Matthew [name] MRN: [id number] Date of Birth: 1951/03/05  Transition of Care [facility]) CM/SW Contact  Lauraine JAYSON Carpen, LCSW Phone Number: 06/05/2024, 10:11 AM  Clinical Narrative:   CSW started auth to see if patient can get rehab when he returns to [facility]. Admissions coordinator is aware.  12:12 pm: Auth still pending.  4:13 pm: Auth still pending.  Expected Discharge Plan: Skilled Nursing Facility Barriers to Discharge: Continued Medical Work up               Expected Discharge Plan and Services     Post Acute Care Choice: Skilled Nursing Facility Living arrangements for the past 2 months: Mobile Home                                       Social Drivers of Health (SDOH) Interventions SDOH Screenings   Food Insecurity: No Food Insecurity (05/27/2024)  Housing: High Risk (05/27/2024)  Transportation Needs: No Transportation Needs (05/30/2024)  Utilities: Not At Risk (05/27/2024)  Social Connections: Moderately Integrated (05/27/2024)  Tobacco Use: Low Risk  (05/30/2024)    Readmission Risk Interventions    05/04/2022   10:42 AM 10/18/2021   12:39 PM  Readmission Risk Prevention Plan  Transportation Screening  Complete  HRI or Home Care Consult Complete   Palliative Care Screening Complete   Medication Review (RN Care Manager) Complete Complete  PCP or Specialist appointment within 3-5 days of discharge  Complete  HRI or Home Care Consult  Complete  SW Recovery Care/Counseling Consult  Complete  Palliative Care Screening  Not Applicable  Skilled Nursing Facility  Not Applicable

## 2024-06-05 NOTE — Plan of Care (Signed)

## 2024-06-05 NOTE — Progress Notes (Signed)
 Progress Note   Patient: Matthew [name] FMW:[id number] DOB: May 09, 1951 DOA: 05/27/2024     7 DOS: the patient was seen and examined on 06/05/2024   Brief hospital course:  H&P HPI  DAMARIAN PRIOLA is a pleasant 73 y.o. male with medical history significant for A-fib on Eliquis , systolic chronic congestive heart failure on Lasix , CAD, bipolar disorder, depression, hypothyroidism, history of CVA who was sent over from eye surgery center after being found to be in A-fib with RVR.  Patient denies any symptoms.  Patient denies any chest pain, shortness of breath, palpitations.  He denies any nausea vomiting fever chills or any other sick feeling.   ED Course: Upon arrival to the ED, patient is found to be A-fib with RVR.  Patient was given 10 mg IV diltiazem  by EMS.  Patient was started on diltiazem  drip in the emergency room.  Hospitalist service was consulted for evaluation for admission.   Further hospital course and management as outlined below.  Assessment and Plan:  Persistent Atrial fibrillation  Presented in afib with RVR. Initially on cardizem  infusion. Started on coreg 6.25 mg BID with improved rate control. Rates are improved with IV diuresis.  Further titration of his medications limited by blood pressures.  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  TTE EF 35 to 40-, global hypokinesis, mild MR.  Dyspnea and volume status improved with IV diuresis - Cardiology following - now transitioned to oral lasix  -Monitor I&O and daily BMP  Hyperkalemia persistent - lasix , lokelma , insulin , trend  COPD with acute exacerbation improving --continue prednisone  40 mg daily started on 10/21 --Continue scheduled duonebs --ceftriaxone >azithromycin  --Continue home inhaler,  reportedly on wixela   Rhinovirus + Cxr with likely atelectasis, some fluid. Having some chest congestion.  Continue mucinex  DM.  Tussionex as needed Neb treatments as above  Anxiety depression Continue home psychotropic medications   Hypothyroidism  TSH WNL. Continue home synthroid    Normocytic anemia  Hgb stable.  Continue iron supplementation    CKD stage IV Appears to be around baseline.  Continue to monitor with BMP. Did get contrast on 10/21 Avoid nephrotoxins and renally dose meds   OSA Nonadherent with CPAP outpatient.  CPAP nightly while admitted   History of CVA CAD [facility] 10/2018 with normal coronary arteries.    Morbid Obesity BMI 41  Complicates overall care and prognosis   Cognitive impairment States he has trouble remembering things. He states that his wife is no longer living. The number listed for her in the chart is disconnected.   Debility Resides at [facility] for snf, auth pending      Subjective: reports feeling a bit better, no chest pain, breathing comfortably   Physical Exam: Vitals:   06/04/24 2332 06/05/24 0356 06/05/24 0742 06/05/24 1137  BP: 117/71 120/87 106/80 118/68  Pulse: 61 74 78 87  Resp: 20 20 18 17   Temp: (!) 97.5 F (36.4 C) (!) 97.5 F (36.4 C) (!) 97.5 F (36.4 C) 97.8 F (36.6 C)  TempSrc:   Oral Oral  SpO2: 100% 98% 100% 93%  Weight:      Height:       General exam: awake, alert, no acute distress, chronically ill-appearing HEENT: moist mucus membranes, hearing grossly normal, poor dentition Respiratory system: Lung sounds diminished but clear with basilar rales Cardiovascular  system: normal S1/S2, RRR, no peripheral edema Gastrointestinal system: soft, NT, ND Central nervous system: A&O x3. no gross focal neurologic deficits, normal speech Extremities: moves all , no edema, normal tone Psychiatry: normal mood, congruent affect   Data Reviewed: Labs today   Family Communication: daughter  telephonically 10/22   Disposition: Status is: Inpatient Remains inpatient appropriate because: Severity of illness   Not yet cleared by consultants for discharge.     Planned Discharge Destination: Skilled nursing facility     Author: Devaughn KATHEE Ban, MD 06/05/2024 1:29 PM  For on call review [email or web address].

## 2024-06-06 DIAGNOSIS — Z91148 Patient's other noncompliance with medication regimen for other reason: Secondary | ICD-10-CM

## 2024-06-06 DIAGNOSIS — I509 Heart failure, unspecified: Secondary | ICD-10-CM | POA: Diagnosis not present

## 2024-06-06 DIAGNOSIS — I428 Other cardiomyopathies: Secondary | ICD-10-CM | POA: Diagnosis not present

## 2024-06-06 DIAGNOSIS — J9601 Acute respiratory failure with hypoxia: Secondary | ICD-10-CM | POA: Diagnosis not present

## 2024-06-06 DIAGNOSIS — I4811 Longstanding persistent atrial fibrillation: Secondary | ICD-10-CM | POA: Diagnosis not present

## 2024-06-06 LAB — BASIC METABOLIC PANEL WITH GFR
Anion gap: 9 (ref 5–15)
BUN: 42 mg/dL — ABNORMAL HIGH (ref 8–23)
CO2: 22 mmol/L (ref 22–32)
Calcium: 9.5 mg/dL (ref 8.9–10.3)
Chloride: 110 mmol/L (ref 98–111)
Creatinine, Ser: 2.32 mg/dL — ABNORMAL HIGH (ref 0.61–1.24)
GFR, Estimated: 29 mL/min — ABNORMAL LOW (ref >60)
Glucose, Bld: 83 mg/dL (ref 70–99)
Potassium: 4.6 mmol/L (ref 3.5–5.1)
Sodium: 141 mmol/L (ref 135–145)

## 2024-06-06 MED ORDER — DAPAGLIFLOZIN PROPANEDIOL 10 MG PO TABS: 10.0000 mg | ORAL_TABLET | Freq: Every day | ORAL | Status: AC

## 2024-06-06 MED ORDER — METOPROLOL SUCCINATE ER 50 MG PO TB24: 50.0000 mg | ORAL_TABLET | Freq: Every day | ORAL | Status: DC

## 2024-06-06 MED ORDER — METOPROLOL SUCCINATE ER 25 MG PO TB24: 25.0000 mg | ORAL_TABLET | Freq: Every evening | ORAL | Status: DC

## 2024-06-06 MED ORDER — TRAMADOL HCL 50 MG PO TABS: 50.0000 mg | ORAL_TABLET | Freq: Two times a day (BID) | ORAL | 0 refills | Status: DC | PRN

## 2024-06-06 NOTE — TOC Transition Note (Signed)
 Transition of Care [facility]) - Discharge Note   Patient Details  Name: Matthew [name] MRN: [id number] Date of Birth: [birth year]  Transition of Care [facility]) CM/SW Contact:  Victory Jackquline RAMAN, RN Phone Number: 06/06/2024, 1:51 PM   Clinical Narrative: Pt discharging to  Healthcare Rm# [id number]. Nurse to call report to [phone]. RNCM  Notified dsughter.  Transport set up with [facility]. There are 2 people ahead of him. No further concerns. RNCM Signing off.        Final next level of care: Skilled Nursing Facility Barriers to Discharge: Barriers Resolved   Patient Goals and CMS Choice            Discharge Placement                Patient to be transferred to facility by: [facility] Name of family member notified: [name] Patient and family notified of of transfer: 06/06/24  Discharge Plan and Services Additional resources added to the After Visit Summary for       Post Acute Care Choice: Skilled Nursing Facility                               Social Drivers of Health (SDOH) Interventions SDOH Screenings   Food Insecurity: No Food Insecurity (05/27/2024)  Housing: High Risk (05/27/2024)  Transportation Needs: No Transportation Needs (05/30/2024)  Utilities: Not At Risk (05/27/2024)  Social Connections: Moderately Integrated (05/27/2024)  Tobacco Use: Low Risk  (05/30/2024)     Readmission Risk Interventions    05/04/2022   10:42 AM 10/18/2021   12:39 PM  Readmission Risk Prevention Plan  Transportation Screening  Complete  HRI or Home Care Consult Complete   Palliative Care Screening Complete   Medication Review (RN Care Manager) Complete Complete  PCP or Specialist appointment within 3-5 days of discharge  Complete  HRI or Home Care Consult  Complete  SW Recovery Care/Counseling Consult  Complete  Palliative Care Screening  Not Applicable  Skilled Nursing Facility  Not Applicable

## 2024-06-06 NOTE — Progress Notes (Signed)
 Cardiology Progress Note   Patient Name: Matthew [name] Date of Encounter: 06/06/2024  Primary Cardiologist: Lonni Hanson, MD  Subjective   Feels well this AM.  Breathing stable.  Says that he has almost constant near-chest pain, but denies active chest pain.  Remains in afib in the 70's to 80's - no palpitations.  Objective   Inpatient Medications    Scheduled Meds:  apixaban   5 mg Oral BID   azithromycin   500 mg Oral Daily   busPIRone  15 mg Oral BID   calcitRIOL  0.25 mcg Oral Daily   dapagliflozin propanediol  10 mg Oral Daily   dextromethorphan-guaiFENesin   1 tablet Oral BID   doxycycline   100 mg Oral Q12H   ferrous sulfate  325 mg Oral Q M,W,F   fluticasone furoate-vilanterol  1 puff Inhalation Daily   furosemide   20 mg Oral Daily   ipratropium-albuterol   3 mL Nebulization BID   lamoTRIgine  50 mg Oral Daily   latanoprost  1 drop Both Eyes QHS   levothyroxine  25 mcg Oral Daily   metoprolol  succinate  25 mg Oral QPM   metoprolol  succinate  50 mg Oral Daily   polyethylene glycol  17 g Oral Daily   prazosin  2 mg Oral QHS   predniSONE   40 mg Oral Q breakfast   senna-docusate  1 tablet Oral BID   sertraline  100 mg Oral Daily   sertraline  25 mg Oral Daily   traZODone   75 mg Oral QHS   Continuous Infusions:  PRN Meds: acetaminophen  **OR** acetaminophen , artificial tears, bisacodyl , bisacodyl , chlorpheniramine-HYDROcodone , nitroGLYCERIN, ondansetron  **OR** ondansetron  (ZOFRAN ) IV, mouth rinse, oxyCODONE    Vital Signs    Vitals:   06/05/24 2355 06/06/24 0428 06/06/24 0839 06/06/24 0902  BP: 136/87 (!) 140/89 (!) 128/91 (!) 132/90  Pulse: 98 75 84 (!) 121  Resp: 18 18 18    Temp: 97.6 F (36.4 C) 97.7 F (36.5 C) 97.8 F (36.6 C) 97.6 F (36.4 C)  TempSrc: Oral Oral Oral   SpO2: 97% 98% 94% 95%  Weight:      Height:        Intake/Output Summary (Last 24 hours) at 06/06/2024 1003 Last data filed at 06/06/2024 0845 Gross per 24 hour  Intake 1050  ml  Output 1815 ml  Net -765 ml   Filed Weights   05/27/24 1035 06/04/24 0444  Weight: (!) 145.2 kg 115.6 kg    Physical Exam   GEN: Well nourished, well developed, in no acute distress.  HEENT: Grossly normal.  Neck: Supple, no carotid bruits or masses.  JVP difficult to gauge in setting of facial hair and body habitus. Cardiac: IR, IR, distant, no murmurs, rubs, or gallops. No clubbing, cyanosis, edema.  Radials 2+, DP/PT 2+ and equal bilaterally.  Respiratory:  Respirations regular and unlabored, coarse breath sounds bilat w/ anterior wheezing. GI: Soft, nontender, nondistended, BS + x 4. MS: no deformity or atrophy. Skin: warm and dry, no rash. Neuro:  Strength and sensation are intact. Psych: AAOx3.  Normal affect.  Labs    Chemistry Recent Labs  Lab 06/04/24 0424 06/04/24 0943 06/05/24 0622 06/06/24 0600  NA 137  --  138 141  K 5.5* 4.5 4.9 4.6  CL 106  --  111 110  CO2 25  --  21* 22  GLUCOSE 115*  --  87 83  BUN 46*  --  44* 42*  CREATININE 2.12*  --  2.35* 2.32*  CALCIUM  9.7  --  9.3 9.5  GFRNONAA 32*  --  28* 29*  ANIONGAP 6  --  6 9     Hematology Recent Labs  Lab 06/03/24 0518 06/04/24 0424  WBC 5.7 6.5  RBC 4.33 4.16*  HGB 12.0* 11.7*  HCT 40.3 38.7*  MCV 93.1 93.0  MCH 27.7 28.1  MCHC 29.8* 30.2  RDW 16.1* 16.3*  PLT 268 273    Cardiac Enzymes  Recent Labs  Lab 05/27/24 1141 06/04/24 0705 06/04/24 0943  TROPONINIHS 8 5 5       BNP    Component Value Date/Time   BNP 287.6 (H) 05/29/2024 1109    Lipids  Lab Results  Component Value Date   CHOL 101 06/13/2021   HDL 40 (L) 06/13/2021   LDLCALC 47 06/13/2021   TRIG 69 06/13/2021   CHOLHDL 2.5 06/13/2021    HbA1c  Lab Results  Component Value Date   HGBA1C 5.9 (H) 06/13/2021   Lab Results  Component Value Date   TSH 1.377 05/27/2024     Radiology    CT HEAD WO CONTRAST ( ) Result Date: 06/04/2024 EXAM: CT HEAD WITHOUT CONTRAST 06/04/2024 03:04:49 PM TECHNIQUE:  CT of the head was performed without the administration of intravenous contrast. Automated exposure control, iterative reconstruction, and/or weight based adjustment of the mA/kV was utilized to reduce the radiation dose to as low as reasonably achievable. COMPARISON: 06/03/2024 CLINICAL HISTORY: code stroke f/u. code stroke f/u FINDINGS: BRAIN AND VENTRICLES: No acute hemorrhage. No evidence of acute infarct. Sequela of mild chronic microvascular ischemic change. No hydrocephalus. No extra-axial collection. No mass effect or midline shift. ORBITS: No acute abnormality. SINUSES: No acute abnormality. SOFT TISSUES AND SKULL: Debris in right external auditory canal, likely cerumen. Bilateral mastoid and middle ear effusions. No skull fracture. IMPRESSION: 1. No acute intracranial abnormality. 2. Mild chronic microvascular ischemic change. 3. Bilateral mastoid and middle ear effusions. Electronically signed by: Donnice Mania MD 06/04/2024 03:26 PM EDT RP Workstation: [id number]   CT ANGIO HEAD NECK W WO CM (CODE STROKE) Result Date: 06/03/2024 EXAM: CTA HEAD AND NECK WITH AND WITHOUT 06/03/2024 02:53:06 PM TECHNIQUE: CTA of the head and neck was performed with and without the administration of 50 mL of iohexol  (OMNIPAQUE ) 350 MG/ML injection. Multiplanar 2D and/or 3D reformatted images are provided for review. Automated exposure control, iterative reconstruction, and/or weight based adjustment of the mA/kV was utilized to reduce the radiation dose to as low as reasonably achievable. Stenosis of the internal carotid arteries measured using NASCET criteria. COMPARISON: Same day CT head CLINICAL HISTORY: Neuro deficit, acute, stroke suspected; isolated anisicoria R pupil 5mm L 2mm acute onset r/o PCOMM aneurysm. Aware of GFR. FINDINGS: CTA NECK: AORTIC ARCH AND ARCH VESSELS: Mild atherosclerosis along the proximal right subclavian artery without significant stenosis. No dissection or arterial injury. No significant  stenosis of the brachiocephalic or subclavian arteries. CERVICAL CAROTID ARTERIES: The right carotid artery is patent from the origin to the skull base. There is minimal atherosclerosis at the right carotid bifurcation without stenosis. Tortuosity of the proximal and mid right cervical ICA with partial retropharyngeal course. The left carotid artery is patent from the origin to the skull base. Mild calcified atherosclerosis at the left carotid bifurcation without hemodynamically significant stenosis. There is tortuosity of the proximal left cervical ICA with partial retropharyngeal course. No dissection or arterial injury. CERVICAL VERTEBRAL ARTERIES: Motion artifact slightly limits evaluation of the right vertebral artery origin. Otherwise, the vertebral arteries are patent from the origins to the  vertebrobasilar confluence. There is dominance of the left vertebral artery. Minimal atherosclerosis along the left V4 segment without stenosis. No dissection or arterial injury. LUNGS AND MEDIASTINUM: There are a few subpleural nodules and tree in bud opacities in the right upper lobe likely related to infection or inflammation. SOFT TISSUES: No acute abnormality. 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 Bilateral mastoid effusions. Degenerative changes in the visualized spine. Dental caries. CTA HEAD: ANTERIOR CIRCULATION: Intracranial internal carotid arteries are patent bilaterally. There is mild atherosclerosis of the carotid siphons without significant stenosis. The anterior cerebral arteries are patent bilaterally. The middle cerebral arteries are patent bilaterally. No aneurysm. POSTERIOR CIRCULATION: No significant stenosis of the posterior cerebral arteries. No significant stenosis of the basilar artery. No significant stenosis of the vertebral arteries. No aneurysm. OTHER: No dural venous sinus thrombosis on this non-dedicated study. IMPRESSION: 1. No  large vessel occlusion, hemodynamically significant stenosis, or aneurysm in the head or neck. 2. Mild atherosclerosis as above. Electronically signed by: Donnice Mania MD 06/03/2024 03:08 PM EDT RP Workstation: [id number]   CT HEAD CODE STROKE WO CONTRAST Result Date: 06/03/2024 EXAM: CT HEAD WITHOUT CONTRAST 06/03/2024 02:38:52 PM TECHNIQUE: CT of the head was performed without the administration of intravenous contrast. Automated exposure control, iterative reconstruction, and/or weight based adjustment of the mA/kV was utilized to reduce the radiation dose to as low as reasonably achievable. COMPARISON: Head CT 05/03/2022. CLINICAL HISTORY: Acute neuro deficit, stroke suspected. FINDINGS: BRAIN AND VENTRICLES: There is no evidence of an acute infarct, intracranial hemorrhage, mass, midline shift, hydrocephalus, or extra-axial fluid collection. There is mild cerebral atrophy. Cerebral Garlow matter hypodensities are similar to the prior CT and nonspecific but compatible with mild chronic small vessel ischemic disease. Calcified atherosclerosis at the skull base. No hyperdense vessel. ORBITS: No acute abnormality. SINUSES: The visualized paranasal sinuses are clear. There are persistent large bilateral mastoid and right middle ear effusions. SOFT TISSUES AND SKULL: No acute soft tissue abnormality. No skull fracture. [place] Stroke Program Early CT Score (ASPECTS) ----- Ganglionic (caudate, IC, lentiform nucleus, insula, M1-M3): 7 Supraganglionic (M4-M6): 3 Total: 10 These results were communicated to Dr. Matthews at 2:44 PM on 06/03/2024 by secure text page via the [facility] messaging system. IMPRESSION: 1. No acute intracranial abnormality. ASPECTS of 10. 2. Mild chronic small vessel ischemic disease. 3. Persistent large bilateral mastoid and right middle ear effusions. Electronically signed by: Dasie Hamburg MD 06/03/2024 02:46 PM EDT RP Workstation: [id number]   DG Chest Port 1 View Result Date:  06/02/2024 CLINICAL DATA:  Dyspnea. EXAM: PORTABLE CHEST 1 VIEW COMPARISON:  Radiograph 05/29/2024 FINDINGS: Mild patient rotation. Worsening left lung base aeration with increasing opacity and possible effusion. There is mild improvement in the right lung base opacity. Stable heart size and mediastinal contours allowing for rotation. No pulmonary edema. No pneumothorax. IMPRESSION: 1. Worsening left lung base aeration with increasing opacity and possible effusion. 2. Mild improvement in right lung base opacity. Electronically Signed   By: Andrea Gasman M.D.   On: 06/02/2024 15:39     Telemetry    Afib, 70's to 80's - Personally Reviewed  Cardiac Studies   2D Echocardiogram 10.14.2025   1. Left ventricular ejection fraction, by estimation, is 35 to 40%. The  left ventricle has moderately decreased function. The left ventricle  demonstrates global hypokinesis. Left ventricular diastolic parameters are  indeterminate.   2. Right ventricular systolic function is normal. The right ventricular  size is normal. There  is normal pulmonary artery systolic pressure. The  estimated right ventricular systolic pressure is 31.0 mmHg.   3. The mitral valve is normal in structure. Mild mitral valve  regurgitation. No evidence of mitral stenosis.   4. The aortic valve is normal in structure. Aortic valve regurgitation is  not visualized. No aortic stenosis is present.   5. The inferior vena cava is normal in size with greater than 50%  respiratory variability, suggesting right atrial pressure of 3 mmHg.  _____________   Patient Profile     73 y.o. male with history of atrial fibrillation, nonischemic cardiomyopathy, hypertension, obstructive lung disease with chronic respiratory failure with hypoxia, CKD stage IV, and obstructive sleep apnea not on CPAP, who was admitted 10/14 w/ resp failure, rhinovirus, and rapid afib.  Assessment & Plan    1.  Persistent Afib:  H?o afib in 10/2021 s/p TEE/DCCV  but then lost to f/u.  Presented 10/14 for cataract surgery and noted to be in rapid afib in the setting of medication noncompliance.  Also progressive dyspnea.  HRs trending 70's to 80's at rest.  90's yesterday afternoon and ? blocker dose adjusted- now on toprol  xl 50 in the AM and 25 in the PM. Rates stable this AM and asymptomatic.  Cont current dose of ? blocker.  Cont eliquis  5 bid.  Pending recovery from rhinovirus and compliance w/ f/u, would look to perform DCCV (+ TEE if concern for noncompliance) in approx 3-4 wks.  2.  NICM/HFrEF: EF 35-40%  by echo this admission.  Prior cath in 10/2018 showed nl cors.  Appears euvolemic on exam this AM and overall notes improved breathing.  Cont ? blocker, lasix  20mg  daily, and sglt2i.  No acei/arb/arni/mra 2/2 CKD IV.  3.  Chronic resp failure/Rhinovirus/COPD/Tob abuse:  Improving though breath sounds remain coarse w/ anterior wheezing.  Abx/steroids/nebs per IM.  Smoking cessation advised.  4. CKD IV:  stable.  5.  Hyperkalemia:  K 5.5 on 10/22.  Nl since.  6.  Hypothyroidism:  Nl TSH this admission.  Cont levothyroxine.  Signed, Lonni Meager, NP  06/06/2024, 10:03 AM    For questions or updates, please contact   Please consult [email or web address] for contact info under Cardiology/STEMI.

## 2024-06-06 NOTE — Discharge Summary (Shared)
 Patient  belongings returned. EMS here to escort patient to [facility]. AVS noted and patient discharge prescriptions noted in discharge packet. Vitals WNL. Day shift nurse called report into receiving facility.

## 2024-06-06 NOTE — Progress Notes (Signed)
 Physical Therapy Treatment Patient Details Name: Matthew [name] MRN: [id number] DOB: 11-17-1950 Today's Date: 06/06/2024   History of Present Illness MCCLAIN SHALL is a pleasant 73 y.o. male with medical history significant for A-fib on Eliquis , systolic chronic congestive heart failure on Lasix , CAD, bipolar disorder, depression, hypothyroidism, history of CVA who was sent over from eye surgery center after being found to be in A-fib with RVR.  Hospital course additionally significant for rapid response due to isolated anisicoria and visual field changes; transferred to CCU for neuro work-up. Initial imaging negative for acute change.    PT Comments  Pt was long sitting in bed upon arrival. He is A and cooperative but does have cognition deficits. Pt endorse recently losing spouse however confirm this is not accurate. He also states he lives at home with his spouse prior to admission, however, this is also inaccurate. Pt was able to exit L side of bed. Stood EOB several times but is limited by fatigue/SOB. Unwilling to attempt formal ambulation but did march in place and demonstrate strength required to ambulate but remained unwilling. HR in A-fib. Remains limited by fatigue and cognition. Acute PT will continue to follow and progress per current POC.          Can travel by private vehicle     No  Equipment Recommendations  Other (comment) (defer to next level of care)       Precautions / Restrictions Precautions Precautions: Fall Recall of Precautions/Restrictions: Impaired Restrictions Weight Bearing Restrictions Per Provider Order: No     Mobility  Bed Mobility Overal bed mobility: Needs Assistance Bed Mobility: Supine to Sit, Sit to Supine  Supine to sit: Min assist Sit to supine: Min assist General bed mobility comments: pt required assistance to exit bed and to return to bed after EOB activity    Transfers Overall transfer level: Needs assistance Equipment used: Rolling  walker (2 wheels) Transfers: Sit to/from Stand Sit to Stand: Min assist, Mod assist  General transfer comment: pt stood EOB 4 x total. min assist form severely elevated bed height but mod assist from lower surfaces. Overall poor activity tolerance with SOB noted aftyer only standing for ~ 10 sec. He is able to perform marching in place but did not want to perform actual ambulation in room. HR in A fib and > 10 bpm throughout most of session.    Ambulation/Gait  General Gait Details: pt unwilling to attempt formal ambulation but did march in place and demonstrerate strength required to perform if more willing. Pt was easily distracted and needed vcs to stay focused on desired task.    Balance Overall balance assessment: Needs assistance Sitting-balance support: No upper extremity supported, Feet supported Sitting balance-Leahy Scale: Good Sitting balance - Comments: pt sat EOB > 15 minutes during session between standing trials. no LOB   Standing balance support: Bilateral upper extremity supported, During functional activity, Reliant on assistive device for balance Standing balance-Leahy Scale: Fair Standing balance comment: no LOB observed with BUE support on RW.       Communication Communication Communication: No apparent difficulties (Pt is very talkative. Repeated same story several times during session)  Cognition Arousal: Alert Behavior During Therapy: WFL for tasks assessed/performed   PT - Cognitive impairments: Problem solving, Safety/Judgement, Awareness    PT - Cognition Comments: poor historian Following commands: Intact      Cueing Cueing Techniques: Verbal cues, Tactile cues         Pertinent Vitals/Pain Pain Assessment  Pain Assessment: No/denies pain Faces Pain Scale: No hurt Pain Intervention(s): Limited activity within patient's tolerance, Monitored during session, Premedicated before session, Repositioned     PT Goals (current goals can now be found in  the care plan section) Acute Rehab PT Goals Patient Stated Goal: none stated Progress towards PT goals: Progressing toward goals    Frequency    Min 2X/week       AM-PAC PT 6 Clicks Mobility   Outcome Measure  Help needed turning from your back to your side while in a flat bed without using bedrails?: A Little Help needed moving from lying on your back to sitting on the side of a flat bed without using bedrails?: A Little Help needed moving to and from a bed to a chair (including a wheelchair)?: A Lot Help needed standing up from a chair using your arms (e.g., wheelchair or bedside chair)?: A Lot Help needed to walk in hospital room?: A Lot Help needed climbing 3-5 steps with a railing? : A Lot 6 Click Score: 14    End of Session   Activity Tolerance: Patient tolerated treatment well Patient left: in bed;with call bell/phone within reach;with bed alarm set Nurse Communication: Mobility status PT Visit Diagnosis: Unsteadiness on feet (R26.81);Other abnormalities of gait and mobility (R26.89);Muscle weakness (generalized) (M62.81);Difficulty in walking, not elsewhere classified (R26.2);Pain     Time: 9059-9040 PT Time Calculation (min) (ACUTE ONLY): 19 min  Charges:    $Therapeutic Activity: 8-22 mins PT General Charges $$ ACUTE PT VISIT: 1 Visit                    Rankin Essex PTA 06/06/24, 11:50 AM

## 2024-06-06 NOTE — Progress Notes (Signed)
 Attempted to call facility for report and was unsuccessful. Will retry again.

## 2024-06-06 NOTE — Progress Notes (Signed)
 Attempted to call facility a second time and was unsuccessful.

## 2024-06-06 NOTE — Discharge Summary (Signed)
 Matthew [name] FMW:[id number] DOB: 10-31-1950 DOA: 05/27/2024  PCP: Britta King, MD  Admit date: 05/27/2024 Discharge date: 06/06/2024  Time spent: 35 minutes  Recommendations for Outpatient Follow-up:  Pcp and cardiology f/u, need to call and schedule [facility] clinic f/u 10/28 as scheduled, check electrolytes and kidney function then    Discharge Diagnoses:  Principal Problem:   A-fib (HCC) Active Problems:   CKD (chronic kidney disease) stage 4, GFR 15-29 ml/min (HCC)   Chronic renal failure (CRF), stage 4 (severe) (HCC)   Chronic systolic CHF (congestive heart failure) (HCC)   COPD (chronic obstructive pulmonary disease) (HCC)   Obesity (BMI 30-39.9)   Hypertension   Shortness of breath   Atrial fibrillation with RVR (HCC)   OSA (obstructive sleep apnea)   Dilated cardiomyopathy (HCC)   Dementia without behavioral disturbance (HCC)   Bipolar disorder (HCC)   Hypothyroidism   Rhinovirus infection   Discharge Condition: improved  Diet recommendation: heart healthy  Filed Weights   05/27/24 1035 06/04/24 0444  Weight: (!) 145.2 kg 115.6 kg    History of present illness:  From admission h and p Matthew [name] is a pleasant 73 y.o. male with medical history significant for A-fib on Eliquis , systolic chronic congestive heart failure on Lasix , CAD, bipolar disorder, depression, hypothyroidism, history of CVA who was sent over from eye surgery center after being found to be in A-fib with RVR.  Patient denies any symptoms.  Patient denies any chest pain, shortness of breath, palpitations.  He denies any nausea vomiting fever chills or any other sick feeling.   Hospital Course:   Persistent Atrial fibrillation  Presented in afib with RVR. Initially on cardizem  infusion. Started on coreg 6.25 mg BID with improved rate control. Rates are improved with IV diuresis.  Further titration of his medications limited by blood pressures.  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  TTE EF 35 to 40-, global hypokinesis, mild MR.  Dyspnea and volume status improved with IV diuresis - Cardiology following - now transitioned to oral lasix    Hyperkalemia Resolved - monitor outpt   COPD with acute exacerbation Resolved with steroid burst and antibiotics --Continue home inhaler, reportedly on wixela    Rhinovirus + Cxr with likely atelectasis, some fluid. Having some chest congestion. Likely instigator of above problems  CKD stage IV Appears to be around baseline.  Continue to monitor with BMP. Did get contrast on 10/21 Avoid nephrotoxins and renally dose meds   OSA Nonadherent with CPAP outpatient.   History of CVA CAD LHC 10/2018 with normal coronary arteries.    Morbid Obesity BMI 41  Complicates overall care and prognosis   Dementia No behavioral disturbance   Debility Resides at [facility]  Procedures: none   Consultations: cardiology  Discharge Exam: Vitals:   06/06/24 0839 06/06/24 0902  BP: (!) 128/91 (!) 132/90  Pulse: 84 (!) 121  Resp: 18   Temp: 97.8 F (36.6 C) 97.6 F (36.4 C)  SpO2: 94% 95%    General exam: awake, alert, no acute distress, chronically ill-appearing HEENT: moist mucus membranes, hearing grossly normal, poor dentition Respiratory system: Lung sounds diminished but clear with basilar rales Cardiovascular system: normal S1/S2, RRR, no peripheral edema Gastrointestinal system: soft, NT, ND Central nervous system: oriented  to self, alert Extremities: moves all , no edema, normal tone Psychiatry: calm  Discharge Instructions   Discharge Instructions     Amb referral to AFIB Clinic   Complete by: As directed    Diet - low sodium heart healthy    Complete by: As directed    Increase activity slowly   Complete by: As directed       Allergies as of 06/06/2024       Reactions   Demerol [meperidine Hcl]    Lisinopril  Other (See Comments)   Hypotensive    Meperidine And Related    Talwin [pentazocine]         Medication List     TAKE these medications    acetaminophen  500 MG tablet Commonly known as: TYLENOL  Take 1,000 mg by mouth 3 (three) times daily.   albuterol  108 (90 Base) MCG/ACT inhaler Commonly known as: VENTOLIN  HFA INHALE 1 PUFF BY MOUTH INTO LUNGS DAILY What changed: See the new instructions.   apixaban  5 MG Tabs tablet Commonly known as: Eliquis  Take 1 tablet (5 mg total) by mouth 2 (two) times daily.   bisacodyl  5 MG EC tablet Commonly known as: DULCOLAX Take 1 tablet (5 mg total) by mouth daily as needed for moderate constipation.   busPIRone 15 MG tablet Commonly known as: BUSPAR Take 15 mg by mouth 2 (two) times daily.   calcitRIOL 0.25 MCG capsule Commonly known as: ROCALTROL Take 0.25 mcg by mouth daily.   dapagliflozin propanediol 10 MG Tabs tablet Commonly known as: FARXIGA Take 1 tablet (10 mg total) by mouth daily. Start taking on: June 07, 2024   diclofenac Sodium 1 % Gel Commonly known as: VOLTAREN Apply 1 Application topically 3 (three) times daily.   ferrous sulfate 325 (65 FE) MG EC tablet Take 325 mg by mouth every Monday, Wednesday, and Friday.   furosemide  20 MG tablet Commonly known as: LASIX  Take 20 mg by mouth daily.   ipratropium-albuterol  0.5-2.5 (3) MG/3ML Soln Commonly known as: DUONEB USE 1 VIAL VIA NEBULIZER EVERY 6 HOURS AS NEEDED   lamoTRIgine 25 MG tablet Commonly known as: LAMICTAL Take 50 mg by mouth daily.   latanoprost 0.005 % ophthalmic solution Commonly known as: XALATAN Place 1 drop into both eyes at bedtime.   levothyroxine 25 MCG tablet Commonly known as: SYNTHROID Take 25 mcg by mouth daily.   metoprolol  succinate 25 MG 24 hr  tablet Commonly known as: TOPROL -XL Take 1 tablet (25 mg total) by mouth every evening.   metoprolol  succinate 50 MG 24 hr tablet Commonly known as: TOPROL -XL Take 1 tablet (50 mg total) by mouth daily. Take with or immediately following a meal. Start taking on: June 07, 2024   prazosin 2 MG capsule Commonly known as: MINIPRESS Take 2 mg by mouth at bedtime.   sertraline 100 MG tablet Commonly known as: ZOLOFT Take 100 mg by mouth daily. (Take with 25mg  tablet)   sertraline 25 MG tablet Commonly known as: ZOLOFT Take 25 mg by mouth daily. (Take with 100mg  tablet)   traMADol  50 MG tablet Commonly known as: ULTRAM  Take 1 tablet (50 mg total) by mouth every 12 (twelve) hours as needed. What changed:  when to take this reasons to take this   traZODone  50 MG tablet Commonly known as: DESYREL  Take 1.5 tablets by mouth at bedtime.   Wixela Inhub 500-50 MCG/ACT Aepb Generic drug: fluticasone-salmeterol Inhale 1 puff into the lungs in the morning and at bedtime.  Allergies  Allergen Reactions   Demerol [Meperidine Hcl]    Lisinopril  Other (See Comments)    Hypotensive    Meperidine And Related    Talwin [Pentazocine]     Contact information for follow-up providers     [facility]. Go on 06/10/2024.   Specialty: Cardiology Why: Facilitay to bring patient to scheudled follow-up appointment or call to reschedule. Hospital Follow-Up 06/10/24 @ 10:00 Please bring all medications to follow-up appointment [facility], [address], Second Floor Free Valet Parking at the [employer] information: [address] [place]  [address] [phone]        Britta King, MD Follow up.   Specialties: Internal Medicine, Cardiology Contact information: [address] [place] [address] [phone]         Perla Evalene PARAS, MD Follow up.   Specialty: Cardiology Contact  information: [address] [place] [address] [phone]              Contact information for after-discharge care     Destination     [facility] .   Service: Skilled Nursing Contact information: [address] [place]  [phone]                      The results of significant diagnostics from this hospitalization (including imaging, microbiology, ancillary and laboratory) are listed below for reference.    Significant Diagnostic Studies: CT HEAD WO CONTRAST ( ) Result Date: 06/04/2024 EXAM: CT HEAD WITHOUT CONTRAST 06/04/2024 03:04:49 PM TECHNIQUE: CT of the head was performed without the administration of intravenous contrast. Automated exposure control, iterative reconstruction, and/or weight based adjustment of the mA/kV was utilized to reduce the radiation dose to as low as reasonably achievable. COMPARISON: 06/03/2024 CLINICAL HISTORY: code stroke f/u. code stroke f/u FINDINGS: BRAIN AND VENTRICLES: No acute hemorrhage. No evidence of acute infarct. Sequela of mild chronic microvascular ischemic change. No hydrocephalus. No extra-axial collection. No mass effect or midline shift. ORBITS: No acute abnormality. SINUSES: No acute abnormality. SOFT TISSUES AND SKULL: Debris in right external auditory canal, likely cerumen. Bilateral mastoid and middle ear effusions. No skull fracture. IMPRESSION: 1. No acute intracranial abnormality. 2. Mild chronic microvascular ischemic change. 3. Bilateral mastoid and middle ear effusions. Electronically signed by: Donnice Mania MD 06/04/2024 03:26 PM EDT RP Workstation: [id number]   CT ANGIO HEAD NECK W WO CM (CODE STROKE) Result Date: 06/03/2024 EXAM: CTA HEAD AND NECK WITH AND WITHOUT 06/03/2024 02:53:06 PM TECHNIQUE: CTA of the head and neck was performed with and without the administration of 50 mL of iohexol  (OMNIPAQUE ) 350 MG/ML injection. Multiplanar 2D and/or 3D  reformatted images are provided for review. Automated exposure control, iterative reconstruction, and/or weight based adjustment of the mA/kV was utilized to reduce the radiation dose to as low as reasonably achievable. Stenosis of the internal carotid arteries measured using NASCET criteria. COMPARISON: Same day CT head CLINICAL HISTORY: Neuro deficit, acute, stroke suspected; isolated anisicoria R pupil 5mm L 2mm acute onset r/o PCOMM aneurysm. Aware of GFR. FINDINGS: CTA NECK: AORTIC ARCH AND ARCH VESSELS: Mild atherosclerosis along the proximal right subclavian artery without significant stenosis. No dissection or arterial injury. No significant stenosis of the brachiocephalic or subclavian arteries. CERVICAL CAROTID ARTERIES: The right carotid artery is patent from the origin to the skull base. There is minimal atherosclerosis at the right carotid bifurcation without stenosis. Tortuosity of the proximal  and mid right cervical ICA with partial retropharyngeal course. The left carotid artery is patent from the origin to the skull base. Mild calcified atherosclerosis at the left carotid bifurcation without hemodynamically significant stenosis. There is tortuosity of the proximal left cervical ICA with partial retropharyngeal course. No dissection or arterial injury. CERVICAL VERTEBRAL ARTERIES: Motion artifact slightly limits evaluation of the right vertebral artery origin. Otherwise, the vertebral arteries are patent from the origins to the vertebrobasilar confluence. There is dominance of the left vertebral artery. Minimal atherosclerosis along the left V4 segment without stenosis. No dissection or arterial injury. LUNGS AND MEDIASTINUM: There are a few subpleural nodules and tree in bud opacities in the right upper lobe likely related to infection or inflammation. SOFT TISSUES: No acute abnormality. 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 Bilateral  mastoid effusions. Degenerative changes in the visualized spine. Dental caries. CTA HEAD: ANTERIOR CIRCULATION: Intracranial internal carotid arteries are patent bilaterally. There is mild atherosclerosis of the carotid siphons without significant stenosis. The anterior cerebral arteries are patent bilaterally. The middle cerebral arteries are patent bilaterally. No aneurysm. POSTERIOR CIRCULATION: No significant stenosis of the posterior cerebral arteries. No significant stenosis of the basilar artery. No significant stenosis of the vertebral arteries. No aneurysm. OTHER: No dural venous sinus thrombosis on this non-dedicated study. IMPRESSION: 1. No large vessel occlusion, hemodynamically significant stenosis, or aneurysm in the head or neck. 2. Mild atherosclerosis as above. Electronically signed by: Donnice Mania MD 06/03/2024 03:08 PM EDT RP Workstation: [id number]   CT HEAD CODE STROKE WO CONTRAST Result Date: 06/03/2024 EXAM: CT HEAD WITHOUT CONTRAST 06/03/2024 02:38:52 PM TECHNIQUE: CT of the head was performed without the administration of intravenous contrast. Automated exposure control, iterative reconstruction, and/or weight based adjustment of the mA/kV was utilized to reduce the radiation dose to as low as reasonably achievable. COMPARISON: Head CT 05/03/2022. CLINICAL HISTORY: Acute neuro deficit, stroke suspected. FINDINGS: BRAIN AND VENTRICLES: There is no evidence of an acute infarct, intracranial hemorrhage, mass, midline shift, hydrocephalus, or extra-axial fluid collection. There is mild cerebral atrophy. Cerebral Vines matter hypodensities are similar to the prior CT and nonspecific but compatible with mild chronic small vessel ischemic disease. Calcified atherosclerosis at the skull base. No hyperdense vessel. ORBITS: No acute abnormality. SINUSES: The visualized paranasal sinuses are clear. There are persistent large bilateral mastoid and right middle ear effusions. SOFT TISSUES AND SKULL:  No acute soft tissue abnormality. No skull fracture. [place] Stroke Program Early CT Score (ASPECTS) ----- Ganglionic (caudate, IC, lentiform nucleus, insula, M1-M3): 7 Supraganglionic (M4-M6): 3 Total: 10 These results were communicated to Dr. Matthews at 2:44 PM on 06/03/2024 by secure text page via the [facility] messaging system. IMPRESSION: 1. No acute intracranial abnormality. ASPECTS of 10. 2. Mild chronic small vessel ischemic disease. 3. Persistent large bilateral mastoid and right middle ear effusions. Electronically signed by: Dasie Hamburg MD 06/03/2024 02:46 PM EDT RP Workstation: [id number]   DG Chest Port 1 View Result Date: 06/02/2024 CLINICAL DATA:  Dyspnea. EXAM: PORTABLE CHEST 1 VIEW COMPARISON:  Radiograph 05/29/2024 FINDINGS: Mild patient rotation. Worsening left lung base aeration with increasing opacity and possible effusion. There is mild improvement in the right lung base opacity. Stable heart size and mediastinal contours allowing for rotation. No pulmonary edema. No pneumothorax. IMPRESSION: 1. Worsening left lung base aeration with increasing opacity and possible effusion. 2. Mild improvement in right lung base opacity. Electronically Signed   By: Andrea Gasman M.D.   On:  06/02/2024 15:39   DG Chest Port 1 View Result Date: 05/29/2024 EXAM: 1 VIEW(S) XRAY OF THE CHEST 05/29/2024 11:04:06 AM COMPARISON: 05/28/2024 CLINICAL HISTORY: Dyspnea FINDINGS: LUNGS AND PLEURA: Increasing bibasilar airspace opacities. HEART AND MEDIASTINUM: No acute abnormality of the cardiac and mediastinal silhouettes. BONES AND SOFT TISSUES: Old right rib fracture. IMPRESSION: 1. Increasing bibasilar airspace opacities. Electronically signed by: Oneil Devonshire MD 05/29/2024 02:37 PM EDT RP Workstation: [id number]   DG Chest Port 1 View Result Date: 05/28/2024 CLINICAL DATA:  Acute hypoxemic respiratory failure EXAM: PORTABLE CHEST 1 VIEW COMPARISON:  Prior chest x-ray 05/03/2022 FINDINGS: Stable cardiomegaly and  mediastinal contours. Slightly increased pulmonary vascular congestion without overt edema. Nonspecific streaky bibasilar airspace opacities favored to reflect atelectasis. No large effusion. No pneumothorax. No acute osseous abnormality. IMPRESSION: 1. Increased pulmonary vascular congestion without overt edema. 2. Streaky bibasilar airspace opacities may reflect atelectasis and/or infiltrate. Atelectasis is favored. Electronically Signed   By: Wilkie Lent M.D.   On: 05/28/2024 13:00   ECHOCARDIOGRAM COMPLETE Result Date: 05/28/2024    ECHOCARDIOGRAM REPORT   Patient Name:   SAIFAN RAYFORD Buffone Date of Exam: 05/27/2024 Medical Rec #:  [id number]      Height:       74.0 in Accession #:    [id number]     Weight:       320.0 lb Date of Birth:  1950-11-07       BSA:          2.654 m Patient Age:    73 years       BP:           105/55 mmHg Patient Gender: M              HR:           81 bpm. Exam Location:  [facility] Procedure: 2D Echo, Cardiac Doppler and Color Doppler (Both Spectral and Color            Flow Doppler were utilized during procedure). Indications:     I50.31 Acute Diastolic CHF  History:         Patient has prior history of Echocardiogram examinations, most                  recent 10/18/2021. CHF, CAD, COPD, Arrythmias:Atrial                  Fibrillation, Signs/Symptoms:Edema; Risk Factors:Sleep Apnea.  Sonographer:     Carl Coma RDCS Referring Phys:  [id number] [facility] PAUDEL Diagnosing Phys: Evalene Lunger MD IMPRESSIONS  1. Left ventricular ejection fraction, by estimation, is 35 to 40%. The left ventricle has moderately decreased function. The left ventricle demonstrates global hypokinesis. Left ventricular diastolic parameters are indeterminate.  2. Right ventricular systolic function is normal. The right ventricular size is normal. There is normal pulmonary artery systolic pressure. The estimated right ventricular systolic pressure is 31.0 mmHg.  3. The mitral valve is normal in structure.  Mild mitral valve regurgitation. No evidence of mitral stenosis.  4. The aortic valve is normal in structure. Aortic valve regurgitation is not visualized. No aortic stenosis is present.  5. The inferior vena cava is normal in size with greater than 50% respiratory variability, suggesting right atrial pressure of 3 mmHg. FINDINGS  Left Ventricle: Left ventricular ejection fraction, by estimation, is 35 to 40%. The left ventricle has moderately decreased function. The left ventricle demonstrates global hypokinesis. Strain was performed and the global longitudinal strain is indeterminate. The left  ventricular internal cavity size was normal in size. There is no left ventricular hypertrophy. Left ventricular diastolic parameters are indeterminate. Right Ventricle: The right ventricular size is normal. No increase in right ventricular wall thickness. Right ventricular systolic function is normal. There is normal pulmonary artery systolic pressure. The tricuspid regurgitant velocity is 2.55 m/s, and  with an assumed right atrial pressure of 5 mmHg, the estimated right ventricular systolic pressure is 31.0 mmHg. Left Atrium: Left atrial size was normal in size. Right Atrium: Right atrial size was normal in size. Pericardium: There is no evidence of pericardial effusion. Mitral Valve: The mitral valve is normal in structure. Mild mitral valve regurgitation. No evidence of mitral valve stenosis. Tricuspid Valve: The tricuspid valve is normal in structure. Tricuspid valve regurgitation is not demonstrated. No evidence of tricuspid stenosis. Aortic Valve: The aortic valve is normal in structure. Aortic valve regurgitation is not visualized. No aortic stenosis is present. Pulmonic Valve: The pulmonic valve was normal in structure. Pulmonic valve regurgitation is not visualized. No evidence of pulmonic stenosis. Aorta: The aortic root is normal in size and structure. Venous: The inferior vena cava is normal in size with greater  than 50% respiratory variability, suggesting right atrial pressure of 3 mmHg. IAS/Shunts: No atrial level shunt detected by color flow Doppler. Additional Comments: 3D was performed not requiring image post processing on an independent workstation and was indeterminate.  LEFT VENTRICLE PLAX 2D LVIDd:         5.90 cm LVIDs:         4.40 cm LV PW:         0.80 cm LV IVS:        0.80 cm LVOT diam:     1.90 cm LV SV:         47 LV SV Index:   18 LVOT Area:     2.84 cm  RIGHT VENTRICLE             IVC RV Basal diam:  4.10 cm     IVC diam: 2.50 cm RV S prime:     10.22 cm/s TAPSE (M-mode): 1.8 cm LEFT ATRIUM             Index        RIGHT ATRIUM           Index LA diam:        4.40 cm 1.66 cm/m   RA Area:     22.50 cm LA Vol (A2C):   87.0 ml 32.78 ml/m  RA Volume:   77.10 ml  29.05 ml/m LA Vol (A4C):   39.5 ml 14.88 ml/m LA Biplane Vol: 60.4 ml 22.76 ml/m  AORTIC VALVE LVOT Vmax:   93.05 cm/s LVOT Vmean:  57.150 cm/s LVOT VTI:    0.166 m  AORTA Ao Root diam: 3.80 cm Ao Asc diam:  4.20 cm MV E velocity: 93.07 cm/s  TRICUSPID VALVE                            TR Peak grad:   26.0 mmHg                            TR Vmax:        255.00 cm/s  SHUNTS                            Systemic VTI:  0.17 m                            Systemic Diam: 1.90 cm Evalene Lunger MD Electronically signed by Evalene Lunger MD Signature Date/Time: 05/28/2024/10:00:21 AM    Final     Microbiology: Recent Results (from the past 240 hours)  Respiratory (~20 pathogens) panel by PCR     Status: Abnormal   Collection Time: 05/28/24  9:48 AM   Specimen: Nasopharyngeal Swab; Respiratory  Result Value Ref Range Status   Adenovirus NOT DETECTED NOT DETECTED Final   Coronavirus 229E NOT DETECTED NOT DETECTED Final    Comment: (NOTE) The Coronavirus on the Respiratory Panel, DOES NOT test for the novel  Coronavirus (2019 nCoV)    Coronavirus HKU1 NOT DETECTED NOT DETECTED Final   Coronavirus NL63 NOT DETECTED  NOT DETECTED Final   Coronavirus OC43 NOT DETECTED NOT DETECTED Final   Metapneumovirus NOT DETECTED NOT DETECTED Final   Rhinovirus / Enterovirus DETECTED (A) NOT DETECTED Final   Influenza A NOT DETECTED NOT DETECTED Final   Influenza B NOT DETECTED NOT DETECTED Final   Parainfluenza Virus 1 NOT DETECTED NOT DETECTED Final   Parainfluenza Virus 2 NOT DETECTED NOT DETECTED Final   Parainfluenza Virus 3 NOT DETECTED NOT DETECTED Final   Parainfluenza Virus 4 NOT DETECTED NOT DETECTED Final   Respiratory Syncytial Virus NOT DETECTED NOT DETECTED Final   Bordetella pertussis NOT DETECTED NOT DETECTED Final   Bordetella Parapertussis NOT DETECTED NOT DETECTED Final   Chlamydophila pneumoniae NOT DETECTED NOT DETECTED Final   Mycoplasma pneumoniae NOT DETECTED NOT DETECTED Final    Comment: Performed at [facility], [address], [place], [place] [address]  MRSA Next Gen by PCR, Nasal     Status: None   Collection Time: 06/03/24  5:37 PM   Specimen: Nasal Mucosa; Nasal Swab  Result Value Ref Range Status   MRSA by PCR Next Gen NOT DETECTED NOT DETECTED Final    Comment: (NOTE) The GeneXpert MRSA Assay (FDA approved for NASAL specimens only), is one component of a comprehensive MRSA colonization surveillance program. It is not intended to diagnose MRSA infection nor to guide or monitor treatment for MRSA infections. Test performance is not FDA approved in patients less than 73 years old. Performed at [facility], [address] [place], [place] [address]      Labs: Basic Metabolic Panel: Recent Labs  Lab 05/31/24 [phone] 06/01/24 0606 06/03/24 0837 06/03/24 1344 06/04/24 0424 06/04/24 0943 06/05/24 0622 06/06/24 0600  NA 135   < > 138 137 137  --  138 141  K 4.5   < > 6.0* 5.4* 5.5* 4.5 4.9 4.6  CL 103   < > 107 104 106  --  111 110  CO2 28   < > 23 23 25   --  21* 22  GLUCOSE 99   < > 93 129* 115*  --  87 83  BUN 51*   < > 45* 47* 46*  --  44* 42*   CREATININE 2.30*   < > 2.21* 2.20* 2.12*  --  2.35* 2.32*  CALCIUM  9.4   < > 9.9 10.2 9.7  --  9.3 9.5  MG 2.4  --   --   --   --   --   --   --    < > =  values in this interval not displayed.   Liver Function Tests: No results for input(s): AST, ALT, ALKPHOS, BILITOT, PROT, ALBUMIN in the last 168 hours. No results for input(s): LIPASE, AMYLASE in the last 168 hours. No results for input(s): AMMONIA in the last 168 hours. CBC: Recent Labs  Lab 06/03/24 0518 06/04/24 0424  WBC 5.7 6.5  HGB 12.0* 11.7*  HCT 40.3 38.7*  MCV 93.1 93.0  PLT 268 273   Cardiac Enzymes: No results for input(s): CKTOTAL, CKMB, CKMBINDEX, TROPONINI in the last 168 hours. BNP: BNP (last 3 results) Recent Labs    05/28/24 0435 05/29/24 1109  BNP 158.5* 287.6*    ProBNP (last 3 results) No results for input(s): PROBNP in the last 8760 hours.  CBG: Recent Labs  Lab 06/03/24 1003 06/03/24 1410 06/03/24 1703 06/03/24 1950 06/04/24 1124  GLUCAP 92 124* 209* 126* 117*       Signed:  Devaughn KATHEE Ban MD.  [facility] 06/06/2024, 11:45 AM

## 2024-06-06 NOTE — Plan of Care (Signed)

## 2024-06-06 NOTE — Progress Notes (Signed)
 [employer] and was able to give report to [employer].

## 2024-06-06 NOTE — Plan of Care (Signed)

## 2024-06-06 NOTE — Progress Notes (Signed)
   06/06/24 1045  Spiritual Encounters  Type of Visit Follow up  Care provided to: Patient  Referral source Chaplain team  Reason for visit Routine spiritual support  OnCall Visit Yes   Made a follow up visit with patient to provide compassionate presence.  Continued to discuss family dynamics and offered encouragement for continued relationship with daughter.

## 2024-06-09 ENCOUNTER — Telehealth: Payer: Self-pay | Admitting: Family

## 2024-06-09 NOTE — Telephone Encounter (Signed)
 Called to confirm/remind patient of their appointment at the [facility] on 06/10/24.   Appointment:   [x] Confirmed  [] Left mess   [] No answer/No voice mail  [] VM Full/unable to leave message  [] Phone not in service  Patient reminded to bring all medications and/or complete list.  Confirmed patient has transportation. Gave directions, instructed to utilize valet parking.

## 2024-06-09 NOTE — Progress Notes (Unsigned)
 [facility] Note   Referring Physician: PCP: Britta King, MD PCP-Cardiologist: Lonni Hanson, MD   Chief Complaint: Shortness of breath   HPI:  [name] is a 73 year old male with a history of systolic chronic congestive heart failure, CVA. Afib, dialated cardiomyopathy, HTN, asthma, COPD, OSA, CKD stage 4, dementia, bipolar disorder, depression, hypothyroidism, OA, gout, alcohol abuse, and obesity.   Echo 3/20 EF >65% and [facility] 3/20 Normal LV function and arteries.   Echo 10/22 EF 50-55%, LV function low-nornal, RV normal, and MR.   Echo 3/23 EF 35-40% LV function moderately decreased and dialated,, RV normal, mild MR, and atrial pressure of 8. TEE 3/23 EF 35-40% LV function moderate to severely decreased, RV low-normal and mildly enlarged, mild MR and TR. Cardioversion 3/23.   Admitted 05/27/24-06/06/24 following appointment at eye surgery center where he was to be in Afib with RVR. Given IV diltiazem  via EMS and started on diltiazem  in ED. Echo showed EF 35-40%, LV function moderately decreased and RV normal. CT Head 06/03/24 mild chronic small vessel ischemic disease. CT Neck showed no occlusions. Discharged home on carvediolol 6.25mg  BID and eliquis  continued.   He presents today for initial TOC HF visit with a chief complaint of shortness of breath. States he used albuterol  yesterday with improved symptoms. Has associated dry cough and endorses occasional wheezing. Reports his heart, feels weak.  Denies chest pain, dizziness, palpitations, edema, Sleep well with 1 pillow at night, denies orthopnea and PND.    Medication list not provided from facility today. Patient unsure of medications but does take what is given by facility. Meals provided by facility but NAS. Drinks fluids primarily with meals.   Presents in wheel chair, reports unsteady gait at baseline which limits activity. Denies alcohol, tobacco, or drug use. Reports previous use of marijuana for 40  years    Review of Systems: [y] = yes, [ ]  = no   General: Weight gain [ ] ; Weight loss [ ] ; Anorexia [ ] ; Fatigue [ ] ; Fever [ ] ; Chills [ ] ; Weakness [ ]   Cardiac: Chest pain/pressure [ ] ; Resting SOB [X] ; Exertional SOB [ ] ; Orthopnea [ ] ; Pedal Edema [ ] ; Palpitations [ ] ; Syncope [ ] ; Presyncope [ ] ; Paroxysmal nocturnal dyspnea[ ]   Pulmonary: Cough GALERIUS.GANT ]; [facility] ]; Hemoptysis[ ] ; Sputum [ ] ; Snoring [ ]   GI: Vomiting[ ] ; Dysphagia[ ] ; Melena[ ] ; Hematochezia [ ] ; Heartburn[ ] ; Abdominal pain [ ] ; Constipation [ ] ; Diarrhea [ ] ; BRBPR [ ]   GU: Hematuria[ ] ; Dysuria [ ] ; Nocturia[ ]   Vascular: Pain in legs with walking [ ] ; Pain in feet with lying flat [ ] ; Non-healing sores [ ] ; Stroke [ ] ; TIA [ ] ; Slurred speech [ ] ;  Neuro: Headaches[ ] ; Vertigo[ ] ; Seizures[ ] ; Paresthesias[ ] ;Blurred vision [ ] ; Diplopia [ ] ; Vision changes [ ]   Ortho/Skin: Arthritis [ ] ; Joint pain [ ] ; Muscle pain [ ] ; Joint swelling [ ] ; Back Pain [ ] ; Rash [ ]   Psych: Depression[X ]; Anxiety[X ]  Heme: Bleeding problems [ ] ; Clotting disorders [ ] ; Anemia [ ]   Endocrine: Diabetes [ ] ; Thyroid  dysfunction[ ]    Past Medical History:  Diagnosis Date   Alcohol abuse, in remission    Anemia    Anxiety    Arrhythmia    atrial fibrillation   Arthritis    Asthma    Atrial fibrillation (HCC)    Bipolar disorder (HCC)    CAD (coronary artery disease)  Cardiac LV ejection fraction 30-35%    CHF (congestive heart failure) (HCC)    Chronic kidney disease, stage 4 (severe) (HCC)    Chronic pain    Chronic systolic (congestive) heart failure (HCC)    COPD (chronic obstructive pulmonary disease) (HCC)    Depression    Dysrhythmia    Afib   Edema    Epilepsy, unspecified, not intractable, without status epilepticus (HCC)    Global hypokinesis of left ventricle    Gout    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     albuterol  (VENTOLIN  HFA) 108 (90 Base) MCG/ACT inhaler INHALE 1 PUFF BY MOUTH INTO LUNGS DAILY (Patient taking differently: 1 puff every 6 (six) hours as needed for shortness of breath.) 18 g 3   apixaban  (ELIQUIS ) 5 MG TABS tablet Take 1 tablet (5 mg total) by mouth 2 (two) times daily. 60 tablet 2   bisacodyl  (DULCOLAX) 5 MG EC tablet Take 1 tablet (5 mg total) by mouth daily as needed for moderate constipation. (Patient not taking: No sig reported) 30 tablet 3   busPIRone (BUSPAR) 15 MG tablet Take 15 mg by mouth 2 (two) times daily.     calcitRIOL (ROCALTROL) 0.25 MCG capsule Take 0.25 mcg by mouth daily.     dapagliflozin propanediol (FARXIGA) 10 MG TABS tablet Take 1 tablet (10 mg total) by mouth daily.     diclofenac Sodium (VOLTAREN) 1 % GEL Apply 1 Application topically 3 (three) times daily.     ferrous sulfate 325 (65 FE) MG EC tablet Take 325 mg by mouth every Monday, Wednesday, and Friday.     fluticasone-salmeterol (WIXELA INHUB) 500-50 MCG/ACT AEPB Inhale 1 puff into the lungs in the morning and at bedtime.     furosemide  (LASIX ) 20 MG tablet Take 20 mg by mouth daily.     ipratropium-albuterol  (DUONEB) 0.5-2.5 (3) MG/3ML SOLN USE 1 VIAL VIA NEBULIZER EVERY 6 HOURS AS NEEDED 3 mL 6   lamoTRIgine (LAMICTAL) 25 MG tablet Take 50 mg by mouth daily.     latanoprost (XALATAN) 0.005 % ophthalmic solution Place 1 drop into both eyes at bedtime.     levothyroxine (SYNTHROID) 25 MCG tablet Take 25 mcg by mouth daily.     metoprolol  succinate (TOPROL -XL) 25 MG 24 hr tablet  Take 1 tablet (25 mg total) by mouth every evening.     metoprolol  succinate (TOPROL -XL) 50 MG 24 hr tablet Take 1 tablet (50 mg total) by mouth daily. Take with or immediately following a meal.     prazosin (MINIPRESS) 2 MG capsule Take 2 mg by mouth at bedtime.     sertraline (ZOLOFT) 100 MG tablet Take 100 mg by mouth daily. (Take with 25mg  tablet)     sertraline (ZOLOFT) 25 MG tablet Take 25 mg by mouth daily. (Take with 100mg  tablet)     traMADol  (ULTRAM ) 50 MG tablet Take 1 tablet (50  mg total) by mouth every 12 (twelve) hours as needed. 30 tablet 0   traZODone  (DESYREL ) 50 MG tablet Take 1.5 tablets by mouth at bedtime.     No current facility-administered medications for this visit.    Allergies  Allergen Reactions   Demerol [Meperidine Hcl]    Lisinopril  Other (See Comments)    Hypotensive    Meperidine And Related    Talwin [Pentazocine]       Social History   Socioeconomic History   Marital status: Widowed    Spouse name: Not on file   Number of children: 1   Years of education: Not on file   Highest education level: Not on file  Occupational History   Not on file  Tobacco Use   Smoking status: Never   Smokeless tobacco: Never  Vaping Use   Vaping status: Never Used  Substance and Sexual Activity   Alcohol use: Yes    Alcohol/week: 105.0 standard drinks of alcohol    Types: 105 Cans of beer per week    Comment: Drinks 15 beers a night in the bar.  Says he doesnt drink at home but goes to the bar everynight.   Drug use: Not Currently   Sexual activity: Not on file  Other Topics Concern   Not on file  Social History Narrative   Not on file   Social Drivers of Health   Financial Resource Strain: Not on file  Food Insecurity: No Food Insecurity (05/27/2024)   Hunger Vital Sign    Worried About Running Out of Food in the Last Year: Never true    Ran Out of Food in the Last Year: Never true  Transportation Needs: No Transportation Needs (05/30/2024)    PRAPARE - Administrator, Civil Service (Medical): No    Lack of Transportation (Non-Medical): No  Physical Activity: Not on file  Stress: Not on file  Social Connections: Moderately Integrated (05/27/2024)   Social Connection and Isolation Panel    Frequency of Communication with Friends and Family: Twice a week    Frequency of Social Gatherings with Friends and Family: Once a week    Attends Religious Services: More than 4 times per year    Active Member of [employer] or Organizations: Yes    Attends Banker Meetings: 1 to 4 times per year    Marital Status: Divorced  Intimate Partner Violence: Not At Risk (05/27/2024)   Humiliation, Afraid, Rape, and Kick questionnaire    Fear of Current or Ex-Partner: No    Emotionally Abused: No    Physically Abused: No    Sexually Abused: No      Family History  Problem Relation Age of Onset   COPD Mother    Cancer Mother    Melanoma Father     Vitals:   06/10/24 1030  BP: (!) 103/55  Pulse: 72  SpO2: 95%  Weight: 115.8 kg   Wt Readings from Last 3 Encounters:  06/10/24 115.8 kg  06/04/24 115.6 kg  05/23/24 104.3 kg     PHYSICAL EXAM: General: Well appearing. Uses wheelchair for mobility.  Cor: No JVD. Irregular rhythm, rate.  Lungs: Mild SHOB at rest. Clear bilterally. Symmetrical chest expansion. Nonproductive cough.  Abdomen: Soft, nontender, nondistended. Extremities: +1 ptting edema bilateral extremities. No rashes or cyanosis.  Neuro:. AO x 2. Affect pleasant. Dementia limits HPI.   ECG: Afib today , personally reviewed   ASSESSMENT & PLAN:  Chronic HFrEF - suspected due  to afib  - NYHA class III - euvolemic  - exam findings indicate mild fluid overload possibly due to medication adherence/changes post discharge - weight up 2 lbs since discharged - encourged to weigh daily and use compression stockings, prescription placed today  - maintain fluid restriction between 60-64 oz daily  -  report weight gain of 2-3lbs/day or >5lbs/week   - Echo 3/20 EF >65% and [facility] 3/20 Normal LV function and arteries.  - Echo 10/22 EF 50-55%, LV function low-nornal, RV normal, and MR.  Echo 3/23 EF 35-40% LV function moderately decreased and dialated,, RV normal, mild MR, and atrial pressure of 8. TEE 3/23 EF 35-40% LV function moderate to severely decreased, RV low-normal and mildly enlarged, mild MR and TR.  - Cardioversion 3/23 - Echo 05/27/24 showed EF 35-40%, LV function moderately decreased and RV normal.  - continue lasix  20mg  daily  - continue farxiga 10mg  daily  - continue metoprolol  25mg  daily  - hold MRA and ACEi/ARB due to low BP  - BMET today   2. Afib  - EKG today showed Afib, asymptomatic  - continue eliquis  5mg  daily  - referral to cardiology placed today   3. CKD stage 4  - BP 103/55 - BMET  0/24/25 reviewed: sodium 141 , potassium 4.6, creatinine 2.32, and GFR 29 - saw neprhology Geoffry) 06/25 - BMET today   4. Hypothyroidism  - continue synthroid   5. Dementia  - limited HPI due to dementia  - no current medication regimen   Follow up in 1 month, sooner if needed.    Solomon Blumenthal, RN 06/09/24

## 2024-06-10 ENCOUNTER — Encounter: Payer: Self-pay | Admitting: Anesthesiology

## 2024-06-10 ENCOUNTER — Encounter: Payer: Self-pay | Admitting: Family

## 2024-06-10 ENCOUNTER — Ambulatory Visit: Admit: 2024-06-10 | Admitting: Ophthalmology

## 2024-06-10 ENCOUNTER — Inpatient Hospital Stay (HOSPITAL_BASED_OUTPATIENT_CLINIC_OR_DEPARTMENT_OTHER): Admitting: Family

## 2024-06-10 VITALS — BP 103/55 | HR 72 | Wt 255.4 lb

## 2024-06-10 DIAGNOSIS — I5022 Chronic systolic (congestive) heart failure: Secondary | ICD-10-CM | POA: Diagnosis not present

## 2024-06-10 DIAGNOSIS — N184 Chronic kidney disease, stage 4 (severe): Secondary | ICD-10-CM

## 2024-06-10 DIAGNOSIS — I4891 Unspecified atrial fibrillation: Secondary | ICD-10-CM

## 2024-06-10 DIAGNOSIS — F0390 Unspecified dementia without behavioral disturbance: Secondary | ICD-10-CM

## 2024-06-10 DIAGNOSIS — E039 Hypothyroidism, unspecified: Secondary | ICD-10-CM

## 2024-06-10 SURGERY — PHACOEMULSIFICATION, CATARACT, WITH IOL INSERTION
Anesthesia: Topical | Laterality: Right

## 2024-06-10 NOTE — Patient Instructions (Signed)
 Medication Changes:  No medication changes.   Lab Work:  Go downstairs to [facility] on [address] to have your blood work completed.  We will only call you if the results are abnormal or if the provider would like to make medication changes.  No news is good news.   Referrals:  We have placed a referral today to General Cardiology for your non heart failure concerns. They should reach out to you within 1-2 weeks. If they do not, please reach out to them. Their information is below on this AVS.   Special Instructions // Education:  Please weight daily and keep a log of your weight.  Wear compression socks daily and remove them at bedtime.   Follow-Up in: 1 month with Ellouise Class, FNP.   Thank you for choosing [facility].    At the [facility], you and your health needs are our priority. We have a designated team specialized in the treatment of Heart Failure. This Care Team includes your primary Heart Failure Specialized Cardiologist (physician), Advanced Practice Providers (APPs- Physician Assistants and Nurse Practitioners), and Pharmacist who all work together to provide you with the care you need, when you need it.   You may see any of the following providers on your designated Care Team at your next follow up:  Dr. Toribio Fuel Dr. Ezra Shuck Dr. Ria Commander Dr. Morene Brownie Ellouise Class, FNP Jaun Bash, RPH-CPP  Please be sure to bring in all your medications bottles to every appointment.   Need to Contact Us :  If you have any questions or concerns before your next appointment please send us  a message through [facility] or call our office at [phone].    TO LEAVE A MESSAGE FOR THE NURSE SELECT OPTION 2, PLEASE LEAVE A MESSAGE INCLUDING: YOUR NAME DATE OF BIRTH CALL BACK NUMBER REASON FOR CALL**this is important as we prioritize the call backs  YOU WILL RECEIVE A CALL BACK THE SAME DAY  AS LONG AS YOU CALL BEFORE 4:00 PM

## 2024-06-11 ENCOUNTER — Ambulatory Visit: Payer: Self-pay | Admitting: Family

## 2024-06-11 LAB — BASIC METABOLIC PANEL WITH GFR
BUN/Creatinine Ratio: 16 (ref 10–24)
BUN: 37 mg/dL — ABNORMAL HIGH (ref 8–27)
CO2: 20 mmol/L (ref 20–29)
Calcium: 9.7 mg/dL (ref 8.6–10.2)
Chloride: 105 mmol/L (ref 96–106)
Creatinine, Ser: 2.25 mg/dL — ABNORMAL HIGH (ref 0.76–1.27)
Glucose: 84 mg/dL (ref 70–99)
Potassium: 4.7 mmol/L (ref 3.5–5.2)
Sodium: 137 mmol/L (ref 134–144)
eGFR: 30 mL/min/1.73 — ABNORMAL LOW (ref >59)

## 2024-06-24 ENCOUNTER — Other Ambulatory Visit: Payer: Self-pay

## 2024-07-02 ENCOUNTER — Encounter: Payer: Self-pay | Admitting: Internal Medicine

## 2024-07-02 ENCOUNTER — Ambulatory Visit: Attending: Internal Medicine | Admitting: Internal Medicine

## 2024-07-02 VITALS — BP 89/54 | HR 96 | Ht 74.0 in | Wt 248.0 lb

## 2024-07-02 DIAGNOSIS — I5022 Chronic systolic (congestive) heart failure: Secondary | ICD-10-CM | POA: Diagnosis not present

## 2024-07-02 DIAGNOSIS — I959 Hypotension, unspecified: Secondary | ICD-10-CM

## 2024-07-02 DIAGNOSIS — I4819 Other persistent atrial fibrillation: Secondary | ICD-10-CM | POA: Diagnosis not present

## 2024-07-02 DIAGNOSIS — N184 Chronic kidney disease, stage 4 (severe): Secondary | ICD-10-CM

## 2024-07-02 NOTE — Patient Instructions (Signed)
 Medication Instructions:  Your physician recommends the following medication changes.  STOP TAKING:  PRAZOSIN  2 MG  *If you need a refill on your cardiac medications before your next appointment, please call your pharmacy*  Lab Work: No labs ordered today  If you have labs (blood work) drawn today and your tests are completely normal, you will receive your results only by: MyChart Message (if you have MyChart) OR A paper copy in the mail If you have any lab test that is abnormal or we need to change your treatment, we will call you to review the results.  Testing/Procedures: No test ordered today   Follow-Up: At [facility], you and your health needs are our priority.  As part of our continuing mission to provide you with exceptional heart care, our providers are all part of one team.  This team includes your primary Cardiologist (physician) and Advanced Practice Providers or APPs (Physician Assistants and Nurse Practitioners) who all work together to provide you with the care you need, when you need it.  Your next appointment:   3-4 month(s)  Provider:   You may see Lonni Hanson, MD or one of the following Advanced Practice Providers on your designated Care Team:   Lonni Meager, NP Lesley Maffucci, PA-C Bernardino Bring, PA-C Cadence [place], PA-C Tylene Lunch, NP Barnie Hila, NP    We recommend signing up for the patient portal called MyChart.  Sign up information is provided on this After Visit Summary.  MyChart is used to connect with patients for Virtual Visits (Telemedicine).  Patients are able to view lab/test results, encounter notes, upcoming appointments, etc.  Non-urgent messages can be sent to your provider as well.   To learn more about what you can do with MyChart, go to [email or web address].

## 2024-07-02 NOTE — Progress Notes (Signed)
 Cardiology Office Note:  .   Date:  07/02/2024  ID:  Matthew [name], DOB 1950/09/24, MRN [id number] PCP: Britta King, MD  [facility] Providers Cardiologist:  Lonni Hanson, MD     History of Present Illness: .   Matthew [name] is a 73 y.o. male with history of persistent atrial fibrillation, nonischemic cardiomyopathy, hypertension, COPD, and chronic kidney disease stage IV, who presents for follow-up of atrial fibrillation and cardiomyopathy.  We have only seen him when hospitalized, most recently last month when he was admitted with acute hypoxic respiratory failure in the setting of rhinovirus infection, acute on chronic HFrEF, and atrial fibrillation with rapid ventricular response.  Echo that admission showed LVEF of 35-40%.  He complained of vague discomfort in the chest.  He was adequately rate controlled and discharged on he was also continued on furosemide  20 mg p.o. daily and dapagliflozin 10 mg daily.  ACE inhibitor/ARB and aldosterone antagonist were deferred in the setting of his advanced CKD.  Metoprolol  50 mg twice daily and apixaban  5 mg twice daily.  He was seen by Ellouise Class, NP, in the heart failure clinic on 06/10/2024, at which time he was felt to be volume overloaded (edema on exam and weight up 2 pounds since hospital discharge).  No medication changes were made at that time.  Today comes to [facility] reports that he has been feeling fairly well.  He still gets short of breath with mild activities such as moving around furniture or walking to our waiting room.  However, this has gotten gradually better since his hospital discharge.  He notes occasional lightheadedness when active but has not passed out or fallen.  He denies chest pain, palpitations, orthopnea, and edema.  He does not know what medications he is taking at his nursing facility.  ROS: See HPI  Studies Reviewed: SABRA   EKG Interpretation Date/Time:  Wednesday July 02 2024 09:19:35 EST Ventricular  Rate:  96 PR Interval:    QRS Duration:  94 QT Interval:  356 QTC Calculation: 449 R Axis:   49  Text Interpretation: Atrial fibrillation Possible Anterior infarct (cited on or before 10-Jun-2024) Abnormal ECG When compared with ECG of 10-Jun-2024 11:12, Nonspecific T wave abnormality has replaced inverted T waves in Inferior leads Confirmed by Kainoa Swoboda ([id number]) on 07/02/2024 9:26:09 AM    TTE (05/27/2024): Normal LV size and wall thickness.  LVEF 35-40% with global hypokinesis.  Indeterminate diastolic parameters.  Normal RV size and function.  Normal PA pressure.  Normal biatrial size.  No pericardial effusion.  Mild mitral regurgitation.  Normal CVP.  Risk Assessment/Calculations:    CHA2DS2-VASc Score = 2   This indicates a 2.2% annual risk of stroke. The patient's score is based upon: CHF History: 1 HTN History: 0 Diabetes History: 0 Stroke History: 0 Vascular Disease History: 0 Age Score: 1 Gender Score: 0            Physical Exam:   VS:  BP (!) 89/54 (BP Location: Left Wrist, Patient Position: Sitting, Cuff Size: Normal)   Pulse 96 Comment: 79 oximeter  Ht 6' 2 (1.88 m)   Wt 248 lb (112.5 kg)   SpO2 93%   BMI 31.84 kg/m    Wt Readings from Last 3 Encounters:  07/02/24 248 lb (112.5 kg)  06/10/24 255 lb 6 oz (115.8 kg)  06/04/24 254 lb 13.6 oz (115.6 kg)    General:  NAD. Neck: No obvious JVD, though facial hair limits evaluation. Lungs:  Mildly diminished breath sounds throughout without wheezes or crackles. Heart: Irregularly irregular rhythm without murmurs. Abdomen: Soft, nontender, nondistended. Extremities: Trace ankle edema bilaterally.  ASSESSMENT AND PLAN: .    Chronic HFrEF: Mr. Marzella has stable NYHA class III symptoms.  He has only minimal ankle edema on exam today and otherwise appears euvolemic.  His weight is also down 7 pounds since his visit with Ellouise Class, NP, in late October.  We will plan to continue his current dose of furosemide   20 mg daily as well as GDMT with metoprolol  succinate 50 mg daily dapagliflozin  10 mg daily.  Soft blood pressure precludes further escalation at this time.  His CKD also makes it challenging to add an ACE inhibitor/ARB or aldosterone antagonist at this time.  May need to consider repeating an echocardiogram at follow-up to see if his EF has improved with better control of his atrial fibrillation.  Persistent atrial fibrillation: [name] remains in rate controlled atrial fibrillation.  He may ultimately require cardioversion, though I am not convinced that he will maintain sinus rhythm without pharmacotherapy.  I am reluctant to add anything today given his soft blood pressure and limited functional capacity at baseline.  He may ultimately be best served by a rate control strategy.  Continue current regimen of metoprolol  succinate 50 mg daily and apixaban  5 mg twice daily.  Hypotension: Blood pressure soft today albeit without symptoms.  I have recommended discontinuation of terazosin (patient is unclear of certain why he is on this medication though I suspect it may have been prescribed in the remote past for treatment of PTSD/nightmares).  He notes that he has not been bothered by nightmares much.  Chronic kidney disease stage IV: Creatinine stable on last check in late October by Ellouise Class, NP.  Continue current medications with ongoing follow-up through PCP.    Dispo: Return to clinic in 3 months.  Signed, Lonni Hanson, MD

## 2024-07-09 ENCOUNTER — Telehealth: Payer: Self-pay | Admitting: Internal Medicine

## 2024-07-09 NOTE — Telephone Encounter (Signed)
 Per Preop clearance request that just came in states the following: Pt to have Cataract Ext. Local Anesthesia. He does not need to D/C Eliquis    Will update the Preop App.

## 2024-07-09 NOTE — Telephone Encounter (Signed)
 Paula from Dr. Jaye office called about if the pt was cleared. See Dr.End's notes which he cleared the pt. I then d/w the preop APP as to what to do about the Heart Failure appt 07/15/24 same day as his eye procedure. Per the preop APP Rosabel Mose, NP: that will be up to the patient if he wants to reschedule his Heart failure appt and keep his eye procedure appt.   Vina, surgery scheduler aware the pt has been cleared by Dr. Mady. I will fax notes to confirmed fax# [phone].

## 2024-07-09 NOTE — Telephone Encounter (Signed)
 Vina returning call. Please advise.

## 2024-07-09 NOTE — Telephone Encounter (Signed)
 Caller Kandra) stated she will be faxing over clearance request for patient to have cataract surgery on 12/2 and 12/23.

## 2024-07-09 NOTE — Telephone Encounter (Signed)
 Patient did not report any unstable symptoms at our visit on 07/02/2024.  However, he has significant functional limitation (NYHA class III heart failure) with borderline low blood pressure.  In general, cataract surgery is low risk from a cardiac standpoint, though given the patient's comorbidities, he is at elevated risk for perioperative cardiovascular complications.  Nonetheless, I do not think that additional cardiac testing/intervention with further mitigate the patient's perioperative cardiac risk.  He can proceed at the discretion of Dr. Jaye.  He should continue apixaban  throughout the perioperative period.  Lonni Hanson, MD [facility]

## 2024-07-09 NOTE — Telephone Encounter (Signed)
 I left a message for Vina the surgery scheduler in regard to the message she is sending out a request today for procedure to be done on 07/15/24 and 08/05/24. I left message as FYI at this time the pt has an appt 07/15/24 with the Heart Failure clinic for a f/u appt.    We will await a request to come over. Once in the chart will have preop APP review.

## 2024-07-09 NOTE — Telephone Encounter (Signed)
 Hi Dr. Mady, Can you please weigh in on preoperative risk evaluation for eye surgery? You saw him on 07/02/24.  Thanks Angie

## 2024-07-09 NOTE — Telephone Encounter (Signed)
Caller returned Pre-op call.

## 2024-07-09 NOTE — Telephone Encounter (Signed)
   Name: Matthew [name]  DOB: 07-Jul-1951  MRN: [id number]   Primary Cardiologist: Lonni Hanson, MD  Chart reviewed as part of pre-operative protocol coverage.   Per Dr. Hanson Patient did not report any unstable symptoms at our visit on 07/02/2024. However, he has significant functional limitation (NYHA class III heart failure) with borderline low blood pressure. In general, cataract surgery is low risk from a cardiac standpoint, though given the patient's comorbidities, he is at elevated risk for perioperative cardiovascular complications. Nonetheless, I do not think that additional cardiac testing/intervention with further mitigate the patient's perioperative cardiac risk. He can proceed at the discretion of Dr. Jaye. He should continue apixaban  throughout the perioperative period.   I will route this recommendation to the requesting party via Epic fax function and remove from pre-op pool. Please call with questions.  Jon Nat Hails, PA 07/09/2024, 4:56 PM

## 2024-07-09 NOTE — Telephone Encounter (Signed)
   Pre-operative Risk Assessment    Patient Name: Matthew [name]  DOB: [birth year] MRN: [id number]   Date of last office visit: 07/02/24 DR. END Date of next office visit: 07/15/24 [facility] F/U APPT   Request for Surgical Clearance    Procedure:  CATARACT EXTRACTION  Date of Surgery:  Clearance 07/15/24 AND 08/05/24                               Surgeon:  DR. ELSIE PORFILIO Surgeon's Group or Practice Name:  [facility] Phone number:  [phone] Fax number:  [phone]   Type of Clearance Requested:   - Medical ; PER FORM NO BLOOD THINNERS NEED TO BE HELD   Type of Anesthesia:  Local    Additional requests/questions:    Signed, Carine Nordgren   07/09/2024, 12:25 PM

## 2024-07-14 ENCOUNTER — Encounter: Payer: Self-pay | Admitting: Ophthalmology

## 2024-07-14 NOTE — Anesthesia Preprocedure Evaluation (Addendum)
 Anesthesia Evaluation  Patient identified by MRN, date of birth, ID band Patient awake    Reviewed: Allergy & Precautions, H&P , NPO status , Patient's Chart, lab work & pertinent test results  Airway        Dental  (+) Missing, Poor Dentition, Chipped Missing all but about 8 teeth, very poor dentition :   Pulmonary neg pulmonary ROS, shortness of breath, asthma , sleep apnea , COPD          Cardiovascular hypertension, + CAD and +CHF  negative cardio ROS + dysrhythmias  Rhythm:Irregular Rate:Tachycardia  05-27-24 1. Left ventricular ejection fraction, by estimation, is 35 to 40%. The  left ventricle has moderately decreased function. The left ventricle  demonstrates global hypokinesis. Left ventricular diastolic parameters are  indeterminate.   2. Right ventricular systolic function is normal. The right ventricular  size is normal. There is normal pulmonary artery systolic pressure. The  estimated right ventricular systolic pressure is 31.0 mmHg.   3. The mitral valve is normal in structure. Mild mitral valve  regurgitation. No evidence of mitral stenosis.   4. The aortic valve is normal in structure. Aortic valve regurgitation is  not visualized. No aortic stenosis is present.   5. The inferior vena cava is normal in size with greater than 50%  respiratory variability, suggesting right atrial pressure of 3 mmHg.     07-09-24  Per Dr. Mady Patient did not report any unstable symptoms at our visit on 07/02/2024. However, he has significant functional limitation (NYHA class III heart failure) with borderline low blood pressure. In general, cataract surgery is low risk from a cardiac standpoint, though given the patient's comorbidities, he is at elevated risk for perioperative cardiovascular complications. Nonetheless, I do not think that additional cardiac testing/intervention with further mitigate the patient's perioperative  cardiac risk. He can proceed at the discretion of Dr. Jaye. He should continue apixaban  throughout the perioperative period.        Neuro/Psych Seizures -,  PSYCHIATRIC DISORDERS Anxiety Depression Bipolar Disorder  Dementia  Neuromuscular disease negative neurological ROS  negative psych ROS   GI/Hepatic negative GI ROS, Neg liver ROS,,,  Endo/Other  negative endocrine ROSHypothyroidism    Renal/GU Renal diseasenegative Renal ROS  negative genitourinary   Musculoskeletal negative musculoskeletal ROS (+) Arthritis ,    Abdominal   Peds negative pediatric ROS (+)  Hematology negative hematology ROS (+) Blood dyscrasia, anemia   Anesthesia Other Findings Previous cataract surgery 05-27-24 Dr. 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)  Epilepsy, unspecified, not intractable, without status epilepticus (HCC) Global hypokinesis of left ventricle Cardiac LV ejection fraction 30-35% Mild mitral regurgitation by prior echocardiogram  Alcohol  abuse, in remission Atrial fibrillation     Reproductive/Obstetrics negative OB ROS                              Anesthesia Physical Anesthesia Plan  ASA: 4  Anesthesia Plan: MAC   Post-op Pain Management:    Induction: Intravenous  PONV Risk Score and Plan:   Airway Management Planned: Natural Airway and Nasal  Cannula  Additional Equipment:   Intra-op Plan:   Post-operative Plan:   Informed Consent: I have reviewed the patients History and Physical, chart, labs and discussed the procedure including the risks, benefits and alternatives for the proposed anesthesia with the patient or authorized representative who has indicated his/her understanding and acceptance.     Dental Advisory Given  Plan Discussed with: Anesthesiologist, CRNA and Surgeon  Anesthesia Plan Comments: (Patient consented for risks of anesthesia including but not limited to:  - adverse reactions to medications - damage to eyes, teeth, lips or other oral mucosa - nerve damage due to positioning  - sore throat or hoarseness - Damage to heart, brain, nerves, lungs, other parts of body or loss of life  Patient voiced understanding and assent.)         Anesthesia Quick Evaluation

## 2024-07-14 NOTE — Discharge Instructions (Signed)

## 2024-07-15 ENCOUNTER — Encounter
Admission: RE | Disposition: A | Discharge status: Other Acute Inpatient Hospital | Payer: Self-pay | Source: Home / Self Care | Attending: Ophthalmology

## 2024-07-15 ENCOUNTER — Encounter: Payer: Self-pay | Admitting: Anesthesiology

## 2024-07-15 ENCOUNTER — Encounter: Payer: Self-pay | Admitting: Ophthalmology

## 2024-07-15 ENCOUNTER — Other Ambulatory Visit: Payer: Self-pay

## 2024-07-15 ENCOUNTER — Ambulatory Visit
Admission: RE | Admit: 2024-07-15 | Discharge: 2024-07-15 | Disposition: A | Discharge status: Other Acute Inpatient Hospital | Attending: Ophthalmology | Admitting: Ophthalmology

## 2024-07-15 ENCOUNTER — Ambulatory Visit: Admitting: Family

## 2024-07-15 ENCOUNTER — Observation Stay
Admission: EM | Admit: 2024-07-15 | Discharge: 2024-07-20 | DRG: 308 | Disposition: A | Discharge status: Skilled Nursing Facility | Source: Ambulatory Visit | Attending: Emergency Medicine | Admitting: Emergency Medicine

## 2024-07-15 ENCOUNTER — Emergency Department

## 2024-07-15 DIAGNOSIS — I4811 Longstanding persistent atrial fibrillation: Secondary | ICD-10-CM | POA: Diagnosis not present

## 2024-07-15 DIAGNOSIS — E875 Hyperkalemia: Secondary | ICD-10-CM

## 2024-07-15 DIAGNOSIS — I4891 Unspecified atrial fibrillation: Principal | ICD-10-CM | POA: Diagnosis present

## 2024-07-15 DIAGNOSIS — I509 Heart failure, unspecified: Secondary | ICD-10-CM

## 2024-07-15 HISTORY — PX: CATARACT EXTRACTION W/PHACO: SHX586

## 2024-07-15 LAB — CBC WITH DIFFERENTIAL/PLATELET
Abs Immature Granulocytes: 0.01 K/uL (ref 0.00–0.07)
Basophils Absolute: 0 K/uL (ref 0.0–0.1)
Basophils Relative: 0 %
Eosinophils Absolute: 0.2 K/uL (ref 0.0–0.5)
Eosinophils Relative: 2 %
HCT: 44.7 % (ref 39.0–52.0)
Hemoglobin: 13.6 g/dL (ref 13.0–17.0)
Immature Granulocytes: 0 %
Lymphocytes Relative: 31 %
Lymphs Abs: 2.2 K/uL (ref 0.7–4.0)
MCH: 30.2 pg (ref 26.0–34.0)
MCHC: 30.4 g/dL (ref 30.0–36.0)
MCV: 99.3 fL (ref 80.0–100.0)
Monocytes Absolute: 0.6 K/uL (ref 0.1–1.0)
Monocytes Relative: 9 %
Neutro Abs: 4.1 K/uL (ref 1.7–7.7)
Neutrophils Relative %: 58 %
Platelets: 269 K/uL (ref 150–400)
RBC: 4.5 MIL/uL (ref 4.22–5.81)
RDW: 17.3 % — ABNORMAL HIGH (ref 11.5–15.5)
WBC: 7.1 K/uL (ref 4.0–10.5)
nRBC: 0 % (ref 0.0–0.2)

## 2024-07-15 LAB — BASIC METABOLIC PANEL WITH GFR
Anion gap: 7 (ref 5–15)
BUN: 31 mg/dL — ABNORMAL HIGH (ref 8–23)
CO2: 21 mmol/L — ABNORMAL LOW (ref 22–32)
Calcium: 10 mg/dL (ref 8.9–10.3)
Chloride: 113 mmol/L — ABNORMAL HIGH (ref 98–111)
Creatinine, Ser: 2.1 mg/dL — ABNORMAL HIGH (ref 0.61–1.24)
GFR, Estimated: 32 mL/min — ABNORMAL LOW (ref >60)
Glucose, Bld: 96 mg/dL (ref 70–99)
Potassium: 5.7 mmol/L — ABNORMAL HIGH (ref 3.5–5.1)
Sodium: 141 mmol/L (ref 135–145)

## 2024-07-15 LAB — PRO BRAIN NATRIURETIC PEPTIDE: Pro Brain Natriuretic Peptide: 3803 pg/mL — ABNORMAL HIGH (ref <300.0)

## 2024-07-15 LAB — MAGNESIUM: Magnesium: 2.3 mg/dL (ref 1.7–2.4)

## 2024-07-15 SURGERY — PHACOEMULSIFICATION, CATARACT, WITH IOL INSERTION
Anesthesia: Monitor Anesthesia Care | Laterality: Left

## 2024-07-15 MED ORDER — FUROSEMIDE 10 MG/ML IJ SOLN
60.0000 mg | Freq: Once | INTRAMUSCULAR | Status: AC
  Administered 2024-07-15: 60 mg via INTRAVENOUS
  Filled 2024-07-15: qty 8

## 2024-07-15 MED ORDER — TETRACAINE HCL 0.5 % OP SOLN
OPHTHALMIC | Status: AC
  Filled 2024-07-15: qty 4

## 2024-07-15 MED ORDER — CYCLOPENTOLATE HCL 2 % OP SOLN: 1.0000 [drp] | OPHTHALMIC | Status: AC

## 2024-07-15 MED ORDER — METOPROLOL TARTRATE 5 MG/5ML IV SOLN
5.0000 mg | Freq: Once | INTRAVENOUS | Status: AC
  Administered 2024-07-15: 5 mg via INTRAVENOUS
  Filled 2024-07-15: qty 5

## 2024-07-15 MED ORDER — MIDAZOLAM HCL 2 MG/2ML IJ SOLN
INTRAMUSCULAR | Status: AC
  Filled 2024-07-15: qty 2

## 2024-07-15 MED ORDER — CYCLOPENTOLATE HCL 2 % OP SOLN
OPHTHALMIC | Status: AC
  Filled 2024-07-15: qty 2

## 2024-07-15 MED ORDER — TETRACAINE HCL 0.5 % OP SOLN
1.0000 [drp] | OPHTHALMIC | Status: DC | PRN
  Administered 2024-07-15: 1 [drp] via OPHTHALMIC

## 2024-07-15 MED ORDER — FENTANYL CITRATE (PF) 100 MCG/2ML IJ SOLN
INTRAMUSCULAR | Status: AC
  Filled 2024-07-15: qty 2

## 2024-07-15 MED ORDER — PHENYLEPHRINE HCL 10 % OP SOLN: 1.0000 [drp] | OPHTHALMIC | Status: AC

## 2024-07-15 MED ORDER — LACTATED RINGERS IV SOLN: INTRAVENOUS | Status: DC

## 2024-07-15 MED ORDER — METOPROLOL TARTRATE 25 MG PO TABS
25.0000 mg | ORAL_TABLET | Freq: Once | ORAL | Status: AC
  Administered 2024-07-15: 25 mg via ORAL
  Filled 2024-07-15: qty 1

## 2024-07-15 MED ORDER — SODIUM ZIRCONIUM CYCLOSILICATE 10 G PO PACK
10.0000 g | PACK | Freq: Once | ORAL | Status: AC
  Administered 2024-07-15: 10 g via ORAL
  Filled 2024-07-15: qty 1

## 2024-07-15 MED ORDER — PHENYLEPHRINE HCL 10 % OP SOLN
OPHTHALMIC | Status: AC
  Filled 2024-07-15: qty 5

## 2024-07-15 SURGICAL SUPPLY — 12 items
CANNULA ANT/CHMB 27G (MISCELLANEOUS) ×1 IMPLANT
CYSTOTOME ANGL RVRS SHRT 25G (CUTTER) ×1 IMPLANT
FEE CATARACT SUITE SIGHTPATH (MISCELLANEOUS) ×1 IMPLANT
GLOVE BIOGEL PI IND STRL 8 (GLOVE) ×1 IMPLANT
GLOVE SURG LX STRL 8.0 MICRO (GLOVE) ×1 IMPLANT
GLOVE SURG SYN 6.5 PF PI BL (GLOVE) ×1 IMPLANT
GLOVE SURG SYN 8.0 PF PI (GLOVE) IMPLANT
NDL FILTER BLUNT 18X1 1/2 (NEEDLE) ×1 IMPLANT
PACK VIT ANT 23G (MISCELLANEOUS) IMPLANT
RING MALYGIN (MISCELLANEOUS) IMPLANT
SUTURE EHLN 10-0 CS-B-6CS-B-6 (SUTURE) IMPLANT
SYR 3ML LL SCALE MARK (SYRINGE) ×1 IMPLANT

## 2024-07-15 NOTE — ED Triage Notes (Signed)
 Pt arrives via ems from the [facility] surgery center, pt was scheduled for cataract surgery today and found to be in A.fib. pt denies pain, dizziness, or sob

## 2024-07-15 NOTE — Progress Notes (Signed)
 On admission, patient's heart rate noted to be 140-160 in atrial fibrillation. Decision made by MD Pelham to transfer patient to [facility] ED for further evaluation and rate control.

## 2024-07-15 NOTE — ED Provider Notes (Addendum)
 [facility] Provider Note    Event Date/Time   First MD Initiated Contact with Patient 07/15/24 1216     (approximate)   History   Atrial Fibrillation   HPI  Matthew [name] is a 73 y.o. male who presents to the ED for evaluation of Atrial Fibrillation   I review a cardiology clinic visit from 11/19.  History of persistent A-fib, nonischemic cardiomyopathy, COPD, CKD 4.  EF 35-40%.  Eliquis , metoprolol .  Patient presents to the ED from local outpatient surgical center for evaluation of rapid A-fib.  History of dementia and patient is limited due to his mental status.   Physical Exam   Triage Vital Signs: ED Triage Vitals  Encounter Vitals Group     BP      Girls Systolic BP Percentile      Girls Diastolic BP Percentile      Boys Systolic BP Percentile      Boys Diastolic BP Percentile      Pulse      Resp      Temp      Temp src      SpO2      Weight      Height      Head Circumference      Peak Flow      Pain Score      Pain Loc      Pain Education      Exclude from Growth Chart     Most recent vital signs: Vitals:   07/15/24 1333 07/15/24 1344  BP: 121/88 (!) 123/92  Pulse: (!) 128 100  Resp:  16  Temp:  97.8 F (36.6 C)  SpO2:  100%    General: Awake, no distress.  CV:  Good peripheral perfusion.  Tachycardic and irregular Resp:  Tachypneic to the mid 20s, bibasilar crackles Abd:  No distention.  MSK:  No deformity noted.  Pitting edema to bilateral lower extremities Neuro:  No focal deficits appreciated. Other:     ED Results / Procedures / Treatments   Labs (all labs ordered are listed, but only abnormal results are displayed) Labs Reviewed  CBC WITH DIFFERENTIAL/PLATELET - Abnormal; Notable for the following components:      Result Value   RDW 17.3 (*)    All other components within normal limits  BASIC METABOLIC PANEL WITH GFR - Abnormal; Notable for the following components:   Potassium 5.7 (*)    Chloride  113 (*)    CO2 21 (*)    BUN 31 (*)    Creatinine, Ser 2.10 (*)    GFR, Estimated 32 (*)    All other components within normal limits  PRO BRAIN NATRIURETIC PEPTIDE - Abnormal; Notable for the following components:   Pro Brain Natriuretic Peptide 3,803.0 (*)    All other components within normal limits  MAGNESIUM     EKG Rapid A-fib with a rate of 124 bpm.  Nonspecific ST changes without STEMI.  RADIOLOGY 1 view CXR interpreted by me with cardiomegaly and pulmonary vascular congestion  Official radiology report(s): DG Chest Portable 1 View Result Date: 07/15/2024 CLINICAL DATA:  AFib. EXAM: PORTABLE CHEST 1 VIEW COMPARISON:  06/02/2024. FINDINGS: The heart size and mediastinal contours are unchanged. Mild retrocardiac opacity, improved from the prior exam, could reflect atelectasis or infiltrate. Mild streaky opacity at the right lateral lung base. No sizable pleural effusion or pneumothorax. Remote right sixth rib fracture. Degenerative changes of the bilateral glenohumeral joints. IMPRESSION:  Mild retrocardiac opacity, improved from the prior exam, and streaky opacity at the right lateral lung base, which could reflect atelectasis or infiltrate. Electronically Signed   By: Harrietta Sherry M.D.   On: 07/15/2024 13:04    PROCEDURES and INTERVENTIONS:  .Critical Care  Performed by: Claudene Rover, MD Authorized by: Claudene Rover, MD   Critical care provider statement:    Critical care time (minutes):  30   Critical care time was exclusive of:  Separately billable procedures and treating other patients   Critical care was necessary to treat or prevent imminent or life-threatening deterioration of the following conditions:  Cardiac failure and circulatory failure   Critical care was time spent personally by me on the following activities:  Development of treatment plan with patient or surrogate, discussions with consultants, evaluation of patient's response to treatment, examination of  patient, ordering and review of laboratory studies, ordering and review of radiographic studies, ordering and performing treatments and interventions, pulse oximetry, re-evaluation of patient's condition and review of old charts .1-3 Lead EKG Interpretation  Performed by: Claudene Rover, MD Authorized by: Claudene Rover, MD     Interpretation: abnormal     ECG rate:  104   ECG rate assessment: tachycardic     Rhythm: atrial fibrillation     Ectopy: none     Conduction: normal     Medications  sodium zirconium cyclosilicate  (LOKELMA ) packet 10 g (has no administration in time range)  furosemide  (LASIX ) injection 60 mg (60 mg Intravenous Given 07/15/24 1334)  metoprolol  tartrate (LOPRESSOR ) injection 5 mg (5 mg Intravenous Given 07/15/24 1334)  metoprolol  tartrate (LOPRESSOR ) tablet 25 mg (25 mg Oral Given 07/15/24 1333)     IMPRESSION / MDM / ASSESSMENT AND PLAN / ED COURSE  I reviewed the triage vital signs and the nursing notes.  Differential diagnosis includes, but is not limited to, CHF exacerbation, medication noncompliance, renal failure, symptomatic anemia, electrolyte derangement  {Patient presents with symptoms of an acute illness or injury that is potentially life-threatening.  Patient presents to the ED in rapid A-fib.  Do not have a MAR from his facility but per his daughter he does reside at [facility].  He is in rapid A-fib, tachypneic and appears volume overloaded.  Initiate diuresis, oral and IV metoprolol .  Noted to be hyperkalemic and elevated BNP, normal CBC.  Will consult medicine  Clinical Course as of 07/15/24 1534  Tue Jul 15, 2024  1318 I call daughter, [name], no response. Left VM to call the ER [DS]  1433 I discuss with [name]. She lives in [place] . He stays at a facility - [facility].  [DS]  1439 reassessed [DS]  1534 I consult with medicine who agrees to admit [DS]    Clinical Course User Index [DS] Claudene Rover, MD     FINAL CLINICAL  IMPRESSION(S) / ED DIAGNOSES   Final diagnoses:  Atrial fibrillation with RVR (HCC)  Acute on chronic congestive heart failure, unspecified heart failure type (HCC)  Hyperkalemia     Rx / DC Orders   ED Discharge Orders     None        Note:  This document was prepared using Dragon voice recognition software and may include unintentional dictation errors.   Claudene Rover, MD 07/15/24 [id number]    Claudene Rover, MD 07/15/24 [phone]

## 2024-07-15 NOTE — Progress Notes (Addendum)
 Patient presented for cataract surgery, but heart rate 150s to 160s, occasionally slows to 130s, but then back up. BP 108/69.  Patient anxious, denies chest pain at this time.  Skin warm and dry.   ECHO 05-27-24 EF 35-40%, LV global hypokinesis. RVSP 31  mild MR.    History atrial fibrillation, but this rate is too fast and uncontrolled A fib. Patient lives in nursing home, [facility]) and they are not able to rate control him. Patient has dementia, is very pleasantly confused.  Very poor memory.  Has recently seen cardiologist, who did not recommend further testing, which is fine, but needs rate control. He is presently in outpatient surgery center, and not candidate for ASC at this time.   Discussed on phone with Patient nurse, and patient's CNA, Charmaine Collum. CNA Charmaine Collum said, I thought he seemed really anxious and short of breath but not short of breath at this time.   Discussed w/Dr. Jaye.  Both Dr. Ola and Dr. Jaye agree to cancel today for rate control.     Spoke on phone with [facility] from [place] [facility]. Gave report.  Patient is unable to go by POV; has a driver, has to wait for driver from [employer]. Will go via EMS to ER.

## 2024-07-15 NOTE — ED Notes (Signed)
 Matthew Pump, MD that patient's HR elevated back into 120's to 130's.
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
## 2024-07-16 ENCOUNTER — Encounter: Payer: Self-pay | Admitting: Emergency Medicine

## 2024-07-16 DIAGNOSIS — I482 Chronic atrial fibrillation, unspecified: Secondary | ICD-10-CM

## 2024-07-16 DIAGNOSIS — I4811 Longstanding persistent atrial fibrillation: Secondary | ICD-10-CM | POA: Diagnosis not present

## 2024-07-16 LAB — BASIC METABOLIC PANEL WITH GFR
Anion gap: 11 (ref 5–15)
BUN: 35 mg/dL — ABNORMAL HIGH (ref 8–23)
CO2: 21 mmol/L — ABNORMAL LOW (ref 22–32)
Calcium: 9.6 mg/dL (ref 8.9–10.3)
Chloride: 108 mmol/L (ref 98–111)
Creatinine, Ser: 2.12 mg/dL — ABNORMAL HIGH (ref 0.61–1.24)
GFR, Estimated: 32 mL/min — ABNORMAL LOW (ref >60)
Glucose, Bld: 91 mg/dL (ref 70–99)
Potassium: 4.6 mmol/L (ref 3.5–5.1)
Sodium: 139 mmol/L (ref 135–145)

## 2024-07-16 LAB — CBC
HCT: 43 % (ref 39.0–52.0)
Hemoglobin: 13.1 g/dL (ref 13.0–17.0)
MCH: 29.6 pg (ref 26.0–34.0)
MCHC: 30.5 g/dL (ref 30.0–36.0)
MCV: 97.1 fL (ref 80.0–100.0)
Platelets: 240 K/uL (ref 150–400)
RBC: 4.43 MIL/uL (ref 4.22–5.81)
RDW: 16.9 % — ABNORMAL HIGH (ref 11.5–15.5)
WBC: 7.1 K/uL (ref 4.0–10.5)
nRBC: 0 % (ref 0.0–0.2)

## 2024-07-16 MED ORDER — ONDANSETRON HCL 4 MG/2ML IJ SOLN
4.0000 mg | Freq: Four times a day (QID) | INTRAMUSCULAR | Status: DC | PRN
  Administered 2024-07-16 – 2024-07-18 (×3): 4 mg via INTRAVENOUS
  Filled 2024-07-16 (×3): qty 2

## 2024-07-16 MED ORDER — TRAZODONE HCL 50 MG PO TABS
25.0000 mg | ORAL_TABLET | Freq: Every evening | ORAL | Status: DC | PRN
  Administered 2024-07-17 – 2024-07-19 (×4): 25 mg via ORAL
  Filled 2024-07-16 (×4): qty 1

## 2024-07-16 MED ORDER — HYDROCODONE-ACETAMINOPHEN 5-325 MG PO TABS
1.0000 | ORAL_TABLET | ORAL | Status: DC | PRN
  Administered 2024-07-17: 1 via ORAL
  Administered 2024-07-17 – 2024-07-19 (×4): 2 via ORAL
  Filled 2024-07-16 (×2): qty 2
  Filled 2024-07-16: qty 1
  Filled 2024-07-16 (×2): qty 2

## 2024-07-16 MED ORDER — SODIUM CHLORIDE 0.9 % IV SOLN: 12.5000 mg | Freq: Four times a day (QID) | INTRAVENOUS | Status: DC | PRN

## 2024-07-16 MED ORDER — ACETAMINOPHEN 650 MG RE SUPP: 650.0000 mg | Freq: Four times a day (QID) | RECTAL | Status: DC | PRN

## 2024-07-16 MED ORDER — APIXABAN 5 MG PO TABS
5.0000 mg | ORAL_TABLET | Freq: Two times a day (BID) | ORAL | Status: DC
  Administered 2024-07-16 – 2024-07-20 (×9): 5 mg via ORAL
  Filled 2024-07-16 (×9): qty 1

## 2024-07-16 MED ORDER — MORPHINE SULFATE (PF) 2 MG/ML IV SOLN
2.0000 mg | INTRAVENOUS | Status: DC | PRN
  Administered 2024-07-18 – 2024-07-19 (×5): 2 mg via INTRAVENOUS
  Filled 2024-07-16 (×6): qty 1

## 2024-07-16 MED ORDER — ACETAMINOPHEN 325 MG PO TABS
650.0000 mg | ORAL_TABLET | Freq: Four times a day (QID) | ORAL | Status: DC | PRN
  Administered 2024-07-17: 650 mg via ORAL
  Filled 2024-07-16 (×2): qty 2

## 2024-07-16 MED ORDER — METOPROLOL SUCCINATE ER 50 MG PO TB24
50.0000 mg | ORAL_TABLET | Freq: Every day | ORAL | Status: DC
  Administered 2024-07-16 – 2024-07-20 (×5): 50 mg via ORAL
  Filled 2024-07-16 (×5): qty 1

## 2024-07-16 NOTE — Progress Notes (Signed)
 Progress Note   Patient: Matthew [name] FMW:[id number] DOB: [birth year] DOA: 07/15/2024     0 DOS: the patient was seen and examined on 07/16/2024   Brief hospital course: From HPI Matthew [name] is a 73 y.o. male with medical history significant of dementia, A-fib on Eliquis , heart failure with reduced ejection fraction, referred to the emergency department from eye surgery center after he was found to be in A-fib with RVR.  Patient is asymptomatic.  An identical presentation in October.   In the Emergency department, rate was initially  130-140.  He was given 25 mg metoprolol  and 5 mg of IV metoprolol  with improvement in his rate. His normal tensive, afebrile, without leukocytosis.  Potassium 5.7, creatinine appears to be at baseline around 2.  He was given IV Lasix  and 10 g of Lokelma , hospitalist consulted to admit.      Assessment and Plan:   Persistent A-fib RVR. - Asymptomatic, hemodynamically stable. Per d/c summary from October, per Cardiology,  pt not appropriate for cardioversion due to medication non-adherence -Rate controlled after oral metoprolol  given in the emergency department Continue Eliquis  as well as metoprolol  Monitor on telemetry closely   HFrEF - clinically euvolemic despite elevated BMP, chest x-ray unremarkable, saturating appropriately on room air. - Resume home diuretics and GDMT when reconciled    Hyperkalemia CKD4- stable  - Potassium 5.7.  Status post 10 g of Lokelma  given in the emergency department.  - Monitor on telemetry, repeat in the morning   Eliquis   No IVF Heart healthy diet   Advance Care Planning:   Code Status: Prior       Subjective:  Patient seen and examined at bedside this morning Heart rate still around 124 Denies chest pain cough or urinary complaint  Physical Exam:  HENT:     Head: Normocephalic.  Eyes:     General: No scleral icterus.    Pupils: Pupils are equal, round, and reactive to light.  Cardiovascular:      Rate and Rhythm: Tachycardia present. Rhythm irregular.  Pulmonary:     Effort: Pulmonary effort is normal. No respiratory distress.     Breath sounds: No wheezing.  Abdominal:     General: There is no distension.     Palpations: Abdomen is soft.     Tenderness: There is no abdominal tenderness.  Musculoskeletal:     Cervical back: Neck supple.     Right lower leg: No edema.     Left lower leg: No edema.  Skin:    General: Skin is warm and dry.   Vitals:   07/16/24 0755 07/16/24 1139 07/16/24 1527 07/16/24 1548  BP: (!) 152/119 123/72 122/72 122/72  Pulse: (!) 54 82 (!) 105 (!) 105  Resp: 17 20 17    Temp:  97.6 F (36.4 C) 98.2 F (36.8 C)   TempSrc:  Oral    SpO2: 100% 100% 95%   Weight:      Height:        Data Reviewed:    Latest Ref Rng & Units 07/16/2024    6:44 AM 07/15/2024   12:35 PM 06/04/2024    4:24 AM  CBC  WBC 4.0 - 10.5 K/uL 7.1  7.1  6.5   Hemoglobin 13.0 - 17.0 g/dL 86.8  86.3  88.2   Hematocrit 39.0 - 52.0 % 43.0  44.7  38.7   Platelets 150 - 400 K/uL 240  269  273        Latest Ref  Rng & Units 07/16/2024    6:44 AM 07/15/2024   12:35 PM 06/10/2024   11:28 AM  BMP  Glucose 70 - 99 mg/dL 91  96  84   BUN 8 - 23 mg/dL 35  31  37   Creatinine 0.61 - 1.24 mg/dL 7.87  7.89  7.74   BUN/Creat Ratio 10 - 24   16   Sodium 135 - 145 mmol/L 139  141  137   Potassium 3.5 - 5.1 mmol/L 4.6  5.7  4.7   Chloride 98 - 111 mmol/L 108  113  105   CO2 22 - 32 mmol/L 21  21  20    Calcium  8.9 - 10.3 mg/dL 9.6  89.9  9.7     Time spent: 52 minutes  Author: Drue ONEIDA Potter, MD 07/16/2024 5:45 PM  For on call review [email or web address].

## 2024-07-16 NOTE — Progress Notes (Signed)
 Heart Failure Navigator Progress Note  Advanced Heart Failure Team patient of Ellouise Class, FNP. Hospital follow-up scheduled for [facility] on 08/04/24 @ 12:00.  Appointment information entered on AVS.    Navigator will sign off at this time.  Charmaine Pines, RN, BSN [facility] Heart Failure Navigator Secure Chat Only

## 2024-07-17 DIAGNOSIS — I482 Chronic atrial fibrillation, unspecified: Secondary | ICD-10-CM | POA: Diagnosis not present

## 2024-07-17 LAB — CBC WITH DIFFERENTIAL/PLATELET
Abs Immature Granulocytes: 0.02 K/uL (ref 0.00–0.07)
Basophils Absolute: 0 K/uL (ref 0.0–0.1)
Basophils Relative: 0 %
Eosinophils Absolute: 0.2 K/uL (ref 0.0–0.5)
Eosinophils Relative: 3 %
HCT: 41.9 % (ref 39.0–52.0)
Hemoglobin: 12.9 g/dL — ABNORMAL LOW (ref 13.0–17.0)
Immature Granulocytes: 0 %
Lymphocytes Relative: 40 %
Lymphs Abs: 2.6 K/uL (ref 0.7–4.0)
MCH: 29.7 pg (ref 26.0–34.0)
MCHC: 30.8 g/dL (ref 30.0–36.0)
MCV: 96.5 fL (ref 80.0–100.0)
Monocytes Absolute: 0.7 K/uL (ref 0.1–1.0)
Monocytes Relative: 11 %
Neutro Abs: 2.9 K/uL (ref 1.7–7.7)
Neutrophils Relative %: 46 %
Platelets: 227 K/uL (ref 150–400)
RBC: 4.34 MIL/uL (ref 4.22–5.81)
RDW: 17 % — ABNORMAL HIGH (ref 11.5–15.5)
WBC: 6.4 K/uL (ref 4.0–10.5)
nRBC: 0 % (ref 0.0–0.2)

## 2024-07-17 LAB — GLUCOSE, CAPILLARY: Glucose-Capillary: 105 mg/dL — ABNORMAL HIGH (ref 70–99)

## 2024-07-17 LAB — BASIC METABOLIC PANEL WITH GFR
Anion gap: 8 (ref 5–15)
BUN: 35 mg/dL — ABNORMAL HIGH (ref 8–23)
CO2: 21 mmol/L — ABNORMAL LOW (ref 22–32)
Calcium: 9.6 mg/dL (ref 8.9–10.3)
Chloride: 107 mmol/L (ref 98–111)
Creatinine, Ser: 1.96 mg/dL — ABNORMAL HIGH (ref 0.61–1.24)
GFR, Estimated: 35 mL/min — ABNORMAL LOW (ref >60)
Glucose, Bld: 89 mg/dL (ref 70–99)
Potassium: 5 mmol/L (ref 3.5–5.1)
Sodium: 137 mmol/L (ref 135–145)

## 2024-07-17 MED ORDER — FERROUS SULFATE 325 (65 FE) MG PO TABS
325.0000 mg | ORAL_TABLET | ORAL | Status: DC
  Administered 2024-07-18: 325 mg via ORAL
  Filled 2024-07-17: qty 1

## 2024-07-17 MED ORDER — FUROSEMIDE 20 MG PO TABS
20.0000 mg | ORAL_TABLET | Freq: Every day | ORAL | Status: DC
  Administered 2024-07-17 – 2024-07-20 (×4): 20 mg via ORAL
  Filled 2024-07-17 (×4): qty 1

## 2024-07-17 MED ORDER — BUSPIRONE HCL 10 MG PO TABS
15.0000 mg | ORAL_TABLET | Freq: Two times a day (BID) | ORAL | Status: DC
  Administered 2024-07-17 – 2024-07-20 (×6): 15 mg via ORAL
  Filled 2024-07-17 (×6): qty 2

## 2024-07-17 MED ORDER — LEVOTHYROXINE SODIUM 25 MCG PO TABS
25.0000 ug | ORAL_TABLET | Freq: Every day | ORAL | Status: DC
  Administered 2024-07-18 – 2024-07-20 (×3): 25 ug via ORAL
  Filled 2024-07-17 (×3): qty 1

## 2024-07-17 MED ORDER — LAMOTRIGINE 100 MG PO TABS
50.0000 mg | ORAL_TABLET | Freq: Every day | ORAL | Status: DC
  Administered 2024-07-17 – 2024-07-20 (×4): 50 mg via ORAL
  Filled 2024-07-17 (×4): qty 1

## 2024-07-17 NOTE — Evaluation (Signed)
 Occupational Therapy Evaluation Patient Details Name: Matthew [name] MRN: [id number] DOB: 10-17-1950 Today's Date: 07/17/2024   History of Present Illness   Matthew [name] is a 73 y.o. male with medical history significant of dementia, A-fib on Eliquis , heart failure with reduced ejection fraction, referred to the emergency department from eye surgery center after he was found to be in A-fib with RVR.  Patient is asymptomatic.  An identical presentation in October.    Clinical Impressions Matthew [name] was seen for OT evaluation this date. Prior to hospital admission, pt was MOD I using RW as needed. Pt is poor historian, aware of STM deficits - states I am not sure if my wife has passed away. Pt currently requires CGA + RW for toilet t/f. MAX A for LB access in sitting. Pt would benefit from skilled OT to address noted impairments and functional limitations (see below for any additional details). Upon hospital discharge, recommend OT follow up <3 hors/day as pt demonstrates cog deficits and limited activity tolerance.     If plan is discharge home, recommend the following:   A lot of help with bathing/dressing/bathroom;Help with stairs or ramp for entrance     Functional Status Assessment   Patient has had a recent decline in their functional status and demonstrates the ability to make significant improvements in function in a reasonable and predictable amount of time.     Equipment Recommendations   BSC/3in1     Recommendations for Other Services         Precautions/Restrictions   Precautions Precautions: Fall Recall of Precautions/Restrictions: Intact Restrictions Weight Bearing Restrictions Per Provider Order: No     Mobility Bed Mobility Overal bed mobility: Modified Independent                  Transfers Overall transfer level: Needs assistance Equipment used: Rolling walker (2 wheels) Transfers: Sit to/from Stand Sit to Stand: Contact guard assist                   Balance Overall balance assessment: Needs assistance Sitting-balance support: No upper extremity supported Sitting balance-Leahy Scale: Good     Standing balance support: Bilateral upper extremity supported Standing balance-Leahy Scale: Poor                             ADL either performed or assessed with clinical judgement   ADL Overall ADL's : Needs assistance/impaired                                       General ADL Comments: CGA + RW for toilet t/f. MAX A for LB access in sitting     Vision         Perception         Praxis         Pertinent Vitals/Pain Pain Assessment Pain Assessment: 0-10 Pain Score: 7  Pain Location: Chronic L knee pain Pain Descriptors / Indicators: Discomfort Pain Intervention(s): Limited activity within patient's tolerance, Repositioned     Extremity/Trunk Assessment Upper Extremity Assessment Upper Extremity Assessment: Generalized weakness   Lower Extremity Assessment Lower Extremity Assessment: Generalized weakness LLE Deficits / Details: chronic L knee pain       Communication Communication Communication: No apparent difficulties   Cognition Arousal: Alert Behavior During Therapy: WFL for tasks assessed/performed Cognition: Cognition impaired  OT - Cognition Comments: pt states he is unsure if his wife has passed away recently or not                 Following commands: Intact       Cueing  General Comments   Cueing Techniques: Verbal cues      Exercises     Shoulder Instructions      Home Living Family/patient expects to be discharged to:: Private residence Living Arrangements: Alone Available Help at Discharge:  (patient unable to clarify. Mentions possible wife but patient is unsure if she is alive? Reports memroy changes.) Type of Home: Mobile home Home Access: Stairs to enter Entrance Stairs-Number of Steps: 4 to 6 Entrance  Stairs-Rails: Right;Left;Can reach both Home Layout: One level     Bathroom Shower/Tub: Walk-in shower;Tub/shower unit   Bathroom Toilet: Standard Bathroom Accessibility: No   Home Equipment: Agricultural Consultant (2 wheels);BSC/3in1;Wheelchair - manual;Cane - single point   Additional Comments: poor historian      Prior Functioning/Environment Prior Level of Function : Patient poor historian/Family not available             Mobility Comments: furniture walks in home. Uses RW in short community distances ADLs Comments: indep with ADL's    OT Problem List: Decreased strength;Decreased range of motion;Decreased activity tolerance;Impaired balance (sitting and/or standing)   OT Treatment/Interventions: Self-care/ADL training;Therapeutic exercise;Energy conservation;DME and/or AE instruction;Therapeutic activities;Patient/family education      OT Goals(Current goals can be found in the care plan section)   Acute Rehab OT Goals Patient Stated Goal: to go home OT Goal Formulation: With patient Time For Goal Achievement: 07/31/24 Potential to Achieve Goals: Good ADL Goals Pt Will Perform Grooming: with modified independence;standing Pt Will Perform Lower Body Dressing: with modified independence;sit to/from stand Pt Will Transfer to Toilet: with modified independence;ambulating;regular height toilet   OT Frequency:  Min 2X/week    Co-evaluation              AM-PAC OT 6 Clicks Daily Activity     Outcome Measure Help from another person eating meals?: None Help from another person taking care of personal grooming?: A Little Help from another person toileting, which includes using toliet, bedpan, or urinal?: A Little Help from another person bathing (including washing, rinsing, drying)?: A Lot Help from another person to put on and taking off regular upper body clothing?: A Little Help from another person to put on and taking off regular lower body clothing?: A Lot 6  Click Score: 17   End of Session Nurse Communication: Mobility status;Patient requests pain meds  Activity Tolerance: Patient tolerated treatment well Patient left: in bed;with call bell/phone within reach;with bed alarm set  OT Visit Diagnosis: Other abnormalities of gait and mobility (R26.89);Muscle weakness (generalized) (M62.81)                Time: 8845-8791 OT Time Calculation (min): 14 min Charges:  OT General Charges $OT Visit: 1 Visit OT Evaluation $OT Eval Low Complexity: 1 Low  Elston Slot, M.S. OTR/L  07/17/24, 4:42 PM  ascom [phone]

## 2024-07-17 NOTE — Progress Notes (Signed)
 Progress Note   Patient: Matthew [name] FMW:[id number] DOB: Feb 27, 1951 DOA: 07/15/2024     1 DOS: the patient was seen and examined on 07/17/2024  \  Brief hospital course: From HPI RAFIEL MECCA is a 73 y.o. male with medical history significant of dementia, A-fib on Eliquis , heart failure with reduced ejection fraction, referred to the emergency department from eye surgery center after he was found to be in A-fib with RVR.  Patient is asymptomatic.  An identical presentation in October.   In the Emergency department, rate was initially  130-140.  He was given 25 mg metoprolol  and 5 mg of IV metoprolol  with improvement in his rate. His normal tensive, afebrile, without leukocytosis.  Potassium 5.7, creatinine appears to be at baseline around 2.  He was given IV Lasix  and 10 g of Lokelma , hospitalist consulted to admit.       Assessment and Plan:    Persistent A-fib RVR. - Asymptomatic, hemodynamically stable. Per d/c summary from October, per Cardiology,  pt not appropriate for cardioversion due to medication non-adherence -Rate controlled after oral metoprolol  given in the emergency department Continue Eliquis  as well as metoprolol  Monitor on telemetry closely   HFrEF - clinically euvolemic despite elevated BMP, chest x-ray unremarkable, saturating appropriately on room air. - Continue home diuretics, metoprolol  We will add other GDMT as blood pressure allows   Hyperkalemia CKD4- stable  Status post a dose of Lokelma  Continue to monitor closely   Eliquis   No IVF Heart healthy diet   Advance Care Planning:   Code Status: Prior        Subjective:  Patient seen and examined at bedside this morning Exhibiting symptoms of dementia He is unsure whether he lives in a facility or home PT evaluation shows he would need rehab   Physical Exam:   HENT:     Head: Normocephalic.  Eyes:     General: No scleral icterus.    Pupils: Pupils are equal, round, and reactive to light.   Cardiovascular:     Rate and Rhythm: Tachycardia present. Rhythm irregular.  Pulmonary:     Effort: Pulmonary effort is normal. No respiratory distress.     Breath sounds: No wheezing.  Abdominal:     General: There is no distension.     Palpations: Abdomen is soft.     Tenderness: There is no abdominal tenderness.  Musculoskeletal:     Cervical back: Neck supple.     Right lower leg: No edema.     Left lower leg: No edema.  Skin:    General: Skin is warm and dry.    Data Reviewed:    Latest Ref Rng & Units 07/17/2024    4:06 AM 07/16/2024    6:44 AM 07/15/2024   12:35 PM  CBC  WBC 4.0 - 10.5 K/uL 6.4  7.1  7.1   Hemoglobin 13.0 - 17.0 g/dL 87.0  86.8  86.3   Hematocrit 39.0 - 52.0 % 41.9  43.0  44.7   Platelets 150 - 400 K/uL 227  240  269        Latest Ref Rng & Units 07/17/2024    4:06 AM 07/16/2024    6:44 AM 07/15/2024   12:35 PM  BMP  Glucose 70 - 99 mg/dL 89  91  96   BUN 8 - 23 mg/dL 35  35  31   Creatinine 0.61 - 1.24 mg/dL 8.03  7.87  7.89   Sodium 135 - 145 mmol/L  137  139  141   Potassium 3.5 - 5.1 mmol/L 5.0  4.6  5.7   Chloride 98 - 111 mmol/L 107  108  113   CO2 22 - 32 mmol/L 21  21  21    Calcium  8.9 - 10.3 mg/dL 9.6  9.6  89.9      Vitals:   07/17/24 0600 07/17/24 0736 07/17/24 1131 07/17/24 1548  BP:  106/77 108/70 119/73  Pulse:  96 92 90  Resp: 18 16 17 16   Temp:  97.9 F (36.6 C) 98 F (36.7 C) 98 F (36.7 C)  TempSrc:      SpO2:  97% 100% 99%  Weight:      Height:         Author: Drue ONEIDA Potter, MD 07/17/2024 4:46 PM  For on call review [email or web address].

## 2024-07-17 NOTE — Evaluation (Signed)
 Physical Therapy Evaluation Patient Details Name: MAC DOWDELL MRN: [id number] DOB: June 13, 1951 Today's Date: 07/17/2024  History of Present Illness  RAEVON BROOM is a 73 y.o. male with medical history significant of dementia, A-fib on Eliquis , heart failure with reduced ejection fraction, referred to the emergency department from eye surgery center after he was found to be in A-fib with RVR.  Patient is asymptomatic.  An identical presentation in October.   Clinical Impression  Pt admitted with above diagnosis. Pt currently with functional limitations due to the deficits listed below (see PT Problem List). Pt received upright in bed agreeable to PT services. Pt reports being furniture walker in household and uses RW for short community distances. Pt reports cognitive changes specifically to memory unsure if his wife is alive or deceased. Reports regular need to call fire department to assist in getting into his home up the stairs.   To date, pt is mod-I with bed mobility and CGA for STS to RW and gait in room. Pt generally with poor hand placement to RW and limited tolerance in distance due to his L knee pain requesting return to supine in bed. Author with cognition concerns and current functional capacity to be able to enter/exit home via 6 stairs. Pt remains in a-fib with mobility but HR ranging from 70's to 80's. Pt left with lunch tray with all needs in reach. Pt would benefit from skilled PT services to address L knee pain with mobility to maximize return to PLOF.       If plan is discharge home, recommend the following: A little help with walking and/or transfers;A little help with bathing/dressing/bathroom;Help with stairs or ramp for entrance;Assist for transportation;Supervision due to cognitive status   Can travel by private vehicle   Yes    Equipment Recommendations None recommended by PT  Recommendations for Other Services       Functional Status Assessment Patient has had a  recent decline in their functional status and demonstrates the ability to make significant improvements in function in a reasonable and predictable amount of time.     Precautions / Restrictions Precautions Precautions: Fall Recall of Precautions/Restrictions: Intact Restrictions Weight Bearing Restrictions Per Provider Order: No      Mobility  Bed Mobility Overal bed mobility: Modified Independent               Patient Response: Cooperative  Transfers Overall transfer level: Needs assistance Equipment used: Rolling walker (2 wheels) Transfers: Sit to/from Stand Sit to Stand: Contact guard assist           General transfer comment: VC's for safe hand placement    Ambulation/Gait Ambulation/Gait assistance: Contact guard assist Gait Distance (Feet): 30 Feet Assistive device: Rolling walker (2 wheels) Gait Pattern/deviations: Step-to pattern, Antalgic       General Gait Details: step to antalgic due to chronic L knee pain. Limited tolerance for household distances.  Stairs            Wheelchair Mobility     Tilt Bed Tilt Bed Patient Response: Cooperative  Modified Rankin (Stroke Patients Only)       Balance Overall balance assessment: Needs assistance Sitting-balance support: No upper extremity supported Sitting balance-Leahy Scale: Good Sitting balance - Comments: able to bend forward to don socks     Standing balance-Leahy Scale: Poor Standing balance comment: heavy BUE support needed on RW  Pertinent Vitals/Pain Pain Assessment Pain Assessment: Faces Faces Pain Scale: Hurts little more Pain Location: Chronic L knee pain Pain Descriptors / Indicators: Discomfort Pain Intervention(s): Limited activity within patient's tolerance, Monitored during session    Home Living Family/patient expects to be discharged to:: Private residence Living Arrangements: Alone Available Help at Discharge:  (patient  unable to clarify. Mentions possible wife but patient is unsure if she is alive? Reports memroy changes.) Type of Home: Mobile home Home Access: Stairs to enter Entrance Stairs-Rails: Right;Left;Can reach both Entrance Stairs-Number of Steps: 4 to 6   Home Layout: One level Home Equipment: [employer]ant (2 wheels);BSC/3in1;Wheelchair - manual;Cane - single point Additional Comments: poor historian    Prior Function Prior Level of Function : Patient poor historian/Family not available             Mobility Comments: furniture walks in home. Uses RW in short community distances ADLs Comments: indep with ADL's     Extremity/Trunk Assessment   Upper Extremity Assessment Upper Extremity Assessment: Defer to OT evaluation    Lower Extremity Assessment Lower Extremity Assessment: Generalized weakness;LLE deficits/detail LLE Deficits / Details: chronic L knee pain       Communication   Communication Communication: No apparent difficulties    Cognition Arousal: Alert Behavior During Therapy: WFL for tasks assessed/performed   PT - Cognitive impairments: History of cognitive impairments                       PT - Cognition Comments: Reports memory and cognition changes. Per MD pt unsure where he lives. Also unsrue if his wife is alive or is deceased. Following commands: Intact       Cueing Cueing Techniques: Verbal cues     General Comments      Exercises     Assessment/Plan    PT Assessment Patient needs continued PT services  PT Problem List Decreased strength;Decreased mobility;Decreased activity tolerance;Decreased cognition       PT Treatment Interventions DME instruction;Gait training;Functional mobility training;Therapeutic activities;Patient/family education;Neuromuscular re-education;Therapeutic exercise;Stair training    PT Goals (Current goals can be found in the Care Plan section)  Acute Rehab PT Goals Patient Stated Goal: to return  home PT Goal Formulation: With patient Time For Goal Achievement: 07/31/24 Potential to Achieve Goals: Fair    Frequency Min 1X/week     Co-evaluation               AM-PAC PT 6 Clicks Mobility  Outcome Measure Help needed turning from your back to your side while in a flat bed without using bedrails?: None Help needed moving from lying on your back to sitting on the side of a flat bed without using bedrails?: None Help needed moving to and from a bed to a chair (including a wheelchair)?: A Little Help needed standing up from a chair using your arms (e.g., wheelchair or bedside chair)?: A Little Help needed to walk in hospital room?: A Lot Help needed climbing 3-5 steps with a railing? : A Lot 6 Click Score: 18    End of Session Equipment Utilized During Treatment: Gait belt Activity Tolerance: Patient tolerated treatment well Patient left: in bed;with call bell/phone within reach;with bed alarm set Nurse Communication: Mobility status PT Visit Diagnosis: Other abnormalities of gait and mobility (R26.89);Pain;Difficulty in walking, not elsewhere classified (R26.2) Pain - Right/Left: Left Pain - part of body: Knee    Time: 8674-8655 PT Time Calculation (min) (ACUTE ONLY): 19 min   Charges:  PT Evaluation $PT Eval Low Complexity: 1 Low   PT General Charges $$ ACUTE PT VISIT: 1 Visit         Dorina HERO. Fairly IV, PT, DPT Physical Therapist- [place]  [facility] 07/17/2024, 3:03 PM

## 2024-07-18 DIAGNOSIS — I482 Chronic atrial fibrillation, unspecified: Secondary | ICD-10-CM | POA: Diagnosis not present

## 2024-07-18 LAB — CBC WITH DIFFERENTIAL/PLATELET
Abs Immature Granulocytes: 0.02 K/uL (ref 0.00–0.07)
Basophils Absolute: 0 K/uL (ref 0.0–0.1)
Basophils Relative: 1 %
Eosinophils Absolute: 0.2 K/uL (ref 0.0–0.5)
Eosinophils Relative: 4 %
HCT: 43.6 % (ref 39.0–52.0)
Hemoglobin: 12.9 g/dL — ABNORMAL LOW (ref 13.0–17.0)
Immature Granulocytes: 0 %
Lymphocytes Relative: 40 %
Lymphs Abs: 2.3 K/uL (ref 0.7–4.0)
MCH: 29.4 pg (ref 26.0–34.0)
MCHC: 29.6 g/dL — ABNORMAL LOW (ref 30.0–36.0)
MCV: 99.3 fL (ref 80.0–100.0)
Monocytes Absolute: 0.6 K/uL (ref 0.1–1.0)
Monocytes Relative: 10 %
Neutro Abs: 2.5 K/uL (ref 1.7–7.7)
Neutrophils Relative %: 45 %
Platelets: 194 K/uL (ref 150–400)
RBC: 4.39 MIL/uL (ref 4.22–5.81)
RDW: 16.4 % — ABNORMAL HIGH (ref 11.5–15.5)
WBC: 5.6 K/uL (ref 4.0–10.5)
nRBC: 0 % (ref 0.0–0.2)

## 2024-07-18 LAB — BASIC METABOLIC PANEL WITH GFR
Anion gap: 6 (ref 5–15)
BUN: 37 mg/dL — ABNORMAL HIGH (ref 8–23)
CO2: 24 mmol/L (ref 22–32)
Calcium: 9.7 mg/dL (ref 8.9–10.3)
Chloride: 108 mmol/L (ref 98–111)
Creatinine, Ser: 2.12 mg/dL — ABNORMAL HIGH (ref 0.61–1.24)
GFR, Estimated: 32 mL/min — ABNORMAL LOW (ref >60)
Glucose, Bld: 111 mg/dL — ABNORMAL HIGH (ref 70–99)
Potassium: 5 mmol/L (ref 3.5–5.1)
Sodium: 138 mmol/L (ref 135–145)

## 2024-07-18 NOTE — Care Management Important Message (Signed)
 Important Message  Patient Details  Name: Matthew [name] MRN: [id number] Date of Birth: 07/08/1951   Important Message Given:  Yes - Medicare IM     [name] W, CMA 07/18/2024, 12:43 PM

## 2024-07-18 NOTE — Plan of Care (Signed)

## 2024-07-18 NOTE — Plan of Care (Signed)
  Problem: Education: Goal: Knowledge of General Education information will improve Description: Including pain rating scale, medication(s)/side effects and non-pharmacologic comfort measures 07/18/2024 2049 by Dorien Jeffry LABOR, RN Outcome: Progressing 07/18/2024 2048 by Dorien Jeffry A, RN Outcome: Progressing   Problem: Health Behavior/Discharge Planning: Goal: Ability to manage health-related needs will improve 07/18/2024 2049 by Dorien Jeffry A, RN Outcome: Progressing 07/18/2024 2048 by Dorien Jeffry A, RN Outcome: Progressing   Problem: Clinical Measurements: Goal: Ability to maintain clinical measurements within normal limits will improve 07/18/2024 2049 by Dorien Jeffry LABOR, RN Outcome: Progressing 07/18/2024 2048 by Dorien Jeffry A, RN Outcome: Progressing Goal: Will remain free from infection 07/18/2024 2049 by Dorien Jeffry LABOR, RN Outcome: Progressing 07/18/2024 2048 by Dorien Jeffry A, RN Outcome: Progressing Goal: Diagnostic test results will improve 07/18/2024 2049 by Dorien Jeffry LABOR, RN Outcome: Progressing 07/18/2024 2048 by Dorien Jeffry A, RN Outcome: Progressing Goal: Respiratory complications will improve 07/18/2024 2049 by Dorien Jeffry LABOR, RN Outcome: Progressing 07/18/2024 2048 by Dorien Jeffry A, RN Outcome: Progressing Goal: Cardiovascular complication will be avoided 07/18/2024 2049 by Dorien Jeffry LABOR, RN Outcome: Progressing 07/18/2024 2048 by Dorien Jeffry A, RN Outcome: Progressing   Problem: Activity: Goal: Risk for activity intolerance will decrease 07/18/2024 2049 by Dorien Jeffry A, RN Outcome: Progressing 07/18/2024 2048 by Dorien Jeffry A, RN Outcome: Progressing   Problem: Nutrition: Goal: Adequate nutrition will be maintained 07/18/2024 2049 by Dorien Jeffry A, RN Outcome: Progressing 07/18/2024 2048 by Dorien Jeffry A, RN Outcome: Progressing   Problem: Coping: Goal: Level  of anxiety will decrease 07/18/2024 2049 by Dorien Jeffry A, RN Outcome: Progressing 07/18/2024 2048 by Dorien Jeffry A, RN Outcome: Progressing   Problem: Elimination: Goal: Will not experience complications related to bowel motility 07/18/2024 2049 by Dorien Jeffry LABOR, RN Outcome: Progressing 07/18/2024 2048 by Dorien Jeffry A, RN Outcome: Progressing Goal: Will not experience complications related to urinary retention 07/18/2024 2049 by Dorien Jeffry LABOR, RN Outcome: Progressing 07/18/2024 2048 by Dorien Jeffry A, RN Outcome: Progressing   Problem: Pain Managment: Goal: General experience of comfort will improve and/or be controlled 07/18/2024 2049 by Dorien Jeffry A, RN Outcome: Progressing 07/18/2024 2048 by Dorien Jeffry A, RN Outcome: Progressing   Problem: Safety: Goal: Ability to remain free from injury will improve 07/18/2024 2049 by Dorien Jeffry LABOR, RN Outcome: Progressing 07/18/2024 2048 by Dorien Jeffry A, RN Outcome: Progressing   Problem: Skin Integrity: Goal: Risk for impaired skin integrity will decrease 07/18/2024 2049 by Dorien Jeffry LABOR, RN Outcome: Progressing 07/18/2024 2048 by Dorien Jeffry LABOR, RN Outcome: Progressing

## 2024-07-18 NOTE — TOC Initial Note (Signed)
 Transition of Care [facility]) - Initial/Assessment Note    Patient Details  Name: Matthew [name] MRN: [id number] Date of Birth: Dec 19, 1950  [facility]) CM/SW Contact:    Alfonso Rummer, LCSW Phone Number: 07/18/2024, 9:44 AM  Clinical Narrative:                  Pt is a long term care resident with [employer]. Pt will return to [employer] when he is medically discharges. No additional TOC needs identified.       Patient Goals and CMS Choice            Expected Discharge Plan and Services      LTC at [facility] healthcare                                         Prior Living Arrangements/Services                       Activities of Daily Living      Permission Sought/Granted                  Emotional Assessment              Admission diagnosis:  Hyperkalemia [E87.5] A-fib (HCC) [I48.91] Atrial fibrillation with RVR (HCC) [I48.91] Acute on chronic congestive heart failure, unspecified heart failure type [facility]) [I50.9] Patient Active Problem List   Diagnosis Date Noted   Rhinovirus infection 06/02/2024   A-fib (HCC) 05/27/2024   Bipolar disorder (HCC) 05/27/2024   Hypothyroidism 05/27/2024   Self-care deficit in patient living alone 06/28/2022   Ambulatory dysfunction 06/25/2022   Osteoarthritis of left knee 06/25/2022   Physical deconditioning 06/25/2022   Chronic anticoagulation 06/25/2022   Chronic renal failure (CRF), stage 4 (severe) (HCC) 06/25/2022   Chronic fracture distal left femur 06/25/2022   Left knee pain 06/25/2022   Dementia without behavioral disturbance (HCC) 06/25/2022   Hyponatremia 05/12/2022   Left shoulder pain 05/10/2022   Malnutrition of moderate degree 05/05/2022   Acute pyelonephritis 05/03/2022   Chronic systolic CHF (congestive heart failure) (HCC) 05/03/2022   Persistent atrial fibrillation (HCC)    Dilated cardiomyopathy (HCC)    Rapid atrial fibrillation (HCC)  10/17/2021   Obesity, Class III, BMI 40-49.9 (morbid obesity) (HCC) 10/17/2021   OSA (obstructive sleep apnea) 10/17/2021   Anemia due to vitamin B12 deficiency    Delirium    Hyperkalemia    Heart failure with preserved ejection fraction (HCC)    CKD (chronic kidney disease) stage 4, GFR 15-29 ml/min (HCC)    Vitamin B12 deficiency    Depression    Atrial fibrillation with rapid ventricular response (HCC) 07/11/2021   Anemia of chronic disease 07/11/2021   Asthma exacerbation 07/03/2021   Chest pain 07/03/2021   Acute gout    Right leg pain    Weakness    Shortness of breath 06/12/2021   Bilateral leg edema 06/12/2021   Atrial fibrillation with RVR (HCC) 06/12/2021   AKI (acute kidney injury) 06/12/2021   Elevated glucose level 06/12/2021   Obesity (BMI 30-39.9) 06/12/2021   Macrocytosis 06/12/2021   Acute on chronic respiratory failure with hypoxia (HCC) 06/12/2021   Chronic pain of left knee 12/16/2020   COPD (chronic obstructive pulmonary disease) (HCC) 12/04/2020   Acute right hip pain 12/04/2020   H/O bilateral hip replacements 12/04/2020  Stage 3b chronic kidney disease (HCC) 12/04/2020   Displaced comminuted fracture of shaft of left femur, initial encounter for closed fracture (HCC) 05/19/2020   Asthma 05/18/2020   Hypertension 05/18/2020   Femur fracture, left (HCC) 05/18/2020   Closed comminuted intra-articular fracture of distal femur (HCC) 05/17/2020   Moderate persistent asthma with exacerbation 10/15/2018   PCP:  Britta King, MD Pharmacy:   [facility] [address]), [place] - [address] [place]) [place] [address] Phone: [phone] Fax: [phone]  [facility] [address] [place] [address] Phone: [phone] Fax: [phone]     Social Drivers of Health (SDOH) Social History: SDOH Screenings   Food Insecurity: No Food Insecurity  (07/16/2024)  Housing: High Risk (07/16/2024)  Transportation Needs: No Transportation Needs (07/16/2024)  Utilities: Not At Risk (07/16/2024)  Social Connections: Moderately Integrated (07/16/2024)  Tobacco Use: Low Risk  (07/16/2024)   SDOH Interventions:     Readmission Risk Interventions    05/04/2022   10:42 AM  Readmission Risk Prevention Plan  HRI or Home Care Consult Complete  Palliative Care Screening Complete  Medication Review (RN Care Manager) Complete

## 2024-07-18 NOTE — Progress Notes (Signed)
 Physical Therapy Treatment Patient Details Name: Matthew [name] MRN: [id number] DOB: 23-Feb-1951 Today's Date: 07/18/2024   History of Present Illness MARKEITH JUE is a 73 y.o. male with medical history significant of dementia, A-fib on Eliquis , heart failure with reduced ejection fraction, referred to the emergency department from eye surgery center after he was found to be in A-fib with RVR.  Patient is asymptomatic.  An identical presentation in October.    PT Comments  Pt received upright in bed agreeable to PT. Pt reports improved knee pain. Exits bed supervision. Pt still needs bouts of momentum, CGA to stand to RW completing minimal home distances of 40' limited due to knee pain. Pt declining any ADL tasks at sink despite encouragement. Pt with notable SOB by end of bout with HR in 140's sitting in recliner. Author with linens chang to bed during seated rest with additional STS and SPT to bed with all needs in reach. Pt requires 3-5 min of recovery and PLB. Pt with all needs in reach. Pt remains with significant memory deficits and is at increased risk of falls. Concerns for ability to care for self at home alone based off of current functional capacity as pt has ~6 steps to enter/exit. D/c recs remain appropriate.    If plan is discharge home, recommend the following: A little help with walking and/or transfers;A little help with bathing/dressing/bathroom;Help with stairs or ramp for entrance;Assist for transportation;Supervision due to cognitive status   Can travel by private vehicle     Yes  Equipment Recommendations  None recommended by PT    Recommendations for Other Services       Precautions / Restrictions Precautions Precautions: Fall Recall of Precautions/Restrictions: Intact Restrictions Weight Bearing Restrictions Per Provider Order: No     Mobility  Bed Mobility                 Patient Response: Cooperative  Transfers Overall transfer level: Needs  assistance Equipment used: Rolling walker (2 wheels) Transfers: Sit to/from Stand Sit to Stand: Contact guard assist           General transfer comment: increased time, bouts of momentum.    Ambulation/Gait Ambulation/Gait assistance: Contact guard assist Gait Distance (Feet): 40 Feet Assistive device: Rolling walker (2 wheels) Gait Pattern/deviations: Step-to pattern, Antalgic       General Gait Details: minimal progress in gait distances   Stairs             Wheelchair Mobility     Tilt Bed Tilt Bed Patient Response: Cooperative  Modified Rankin (Stroke Patients Only)       Balance Overall balance assessment: Needs assistance Sitting-balance support: No upper extremity supported Sitting balance-Leahy Scale: Good     Standing balance support: Bilateral upper extremity supported Standing balance-Leahy Scale: Poor Standing balance comment: heavy BUE support needed on RW                            Communication Communication Communication: No apparent difficulties  Cognition Arousal: Alert Behavior During Therapy: WFL for tasks assessed/performed   PT - Cognitive impairments: History of cognitive impairments                         Following commands: Intact      Cueing Cueing Techniques: Verbal cues  Exercises      General Comments        Pertinent Vitals/Pain Pain  Assessment Pain Assessment: Faces Faces Pain Scale: Hurts little more Pain Location: Chronic L knee pain Pain Descriptors / Indicators: Discomfort Pain Intervention(s): Limited activity within patient's tolerance, Monitored during session, Repositioned    Home Living                          Prior Function            PT Goals (current goals can now be found in the care plan section) Acute Rehab PT Goals Patient Stated Goal: to return home PT Goal Formulation: With patient Time For Goal Achievement: 07/31/24 Potential to Achieve Goals:  Fair Progress towards PT goals: Progressing toward goals    Frequency    Min 2X/week      PT Plan      Co-evaluation              AM-PAC PT 6 Clicks Mobility   Outcome Measure  Help needed turning from your back to your side while in a flat bed without using bedrails?: None Help needed moving from lying on your back to sitting on the side of a flat bed without using bedrails?: None Help needed moving to and from a bed to a chair (including a wheelchair)?: A Little Help needed standing up from a chair using your arms (e.g., wheelchair or bedside chair)?: A Little Help needed to walk in hospital room?: A Lot Help needed climbing 3-5 steps with a railing? : A Lot 6 Click Score: 18    End of Session Equipment Utilized During Treatment: Gait belt Activity Tolerance: Patient tolerated treatment well Patient left: in bed;with call bell/phone within reach;with bed alarm set Nurse Communication: Mobility status PT Visit Diagnosis: Other abnormalities of gait and mobility (R26.89);Pain;Difficulty in walking, not elsewhere classified (R26.2) Pain - Right/Left: Left Pain - part of body: Knee     Time: 1120-1136 PT Time Calculation (min) (ACUTE ONLY): 16 min  Charges:    $Therapeutic Activity: 8-22 mins PT General Charges $$ ACUTE PT VISIT: 1 Visit                     Dorina HERO. Fairly IV, PT, DPT Physical Therapist- [place]  [facility] 07/18/2024, 1:12 PM

## 2024-07-18 NOTE — Progress Notes (Signed)
 Progress Note   Patient: Matthew [name] FMW:[id number] DOB: [birth year] DOA: 07/15/2024     2 DOS: the patient was seen and examined on 07/18/2024    Brief hospital course: From HPI TANOR GLASPY is a 73 y.o. male with medical history significant of dementia, A-fib on Eliquis , heart failure with reduced ejection fraction, referred to the emergency department from eye surgery center after he was found to be in A-fib with RVR.  Patient is asymptomatic.  An identical presentation in October.   In the Emergency department, rate was initially  130-140.  He was given 25 mg metoprolol  and 5 mg of IV metoprolol  with improvement in his rate. His normal tensive, afebrile, without leukocytosis.  Potassium 5.7, creatinine appears to be at baseline around 2.  He was given IV Lasix  and 10 g of Lokelma , hospitalist consulted to admit.       Assessment and Plan:    Persistent A-fib RVR. - Asymptomatic, hemodynamically stable. Per d/c summary from October, per Cardiology,  pt not appropriate for cardioversion due to medication non-adherence -Rate controlled after oral metoprolol  given in the emergency department Continue Eliquis  as well as metoprolol  Monitor on telemetry closely PT OT has seen patient with recommendation for acute rehab   HFrEF - clinically euvolemic despite elevated BMP, chest x-ray unremarkable, saturating appropriately on room air. - Continue home diuretics, metoprolol  We will add other GDMT as blood pressure allows   Hyperkalemia CKD4- stable  Status post a dose of Lokelma  Continue to monitor closely     Advance Care Planning:   Code Status: Prior        Subjective:  Patient denies any acute overnight event Currently medically stable awaiting rehab   Physical Exam:   HENT:     Head: Normocephalic.  Eyes:     General: No scleral icterus.    Pupils: Pupils are equal, round, and reactive to light.  Cardiovascular:     Rate and Rhythm: Tachycardia  resolved Pulmonary:     Effort: Pulmonary effort is normal. No respiratory distress.     Breath sounds: No wheezing.  Abdominal:     General: There is no distension.     Palpations: Abdomen is soft.     Tenderness: There is no abdominal tenderness.  Musculoskeletal:     Cervical back: Neck supple.     Right lower leg: No edema.     Left lower leg: No edema.  Skin:    General: Skin is warm and dry.     Data Reviewed:     Latest Ref Rng & Units 07/18/2024    4:46 AM 07/17/2024    4:06 AM 07/16/2024    6:44 AM  BMP  Glucose 70 - 99 mg/dL 888  89  91   BUN 8 - 23 mg/dL 37  35  35   Creatinine 0.61 - 1.24 mg/dL 7.87  8.03  7.87   Sodium 135 - 145 mmol/L 138  137  139   Potassium 3.5 - 5.1 mmol/L 5.0  5.0  4.6   Chloride 98 - 111 mmol/L 108  107  108   CO2 22 - 32 mmol/L 24  21  21    Calcium  8.9 - 10.3 mg/dL 9.7  9.6  9.6     Vitals:   07/18/24 0444 07/18/24 0751 07/18/24 1226 07/18/24 1534  BP: 93/68 96/72 115/76 128/76  Pulse: 75 85 77 92  Resp: 20 16 16 20   Temp: (!) 97.4 F (36.3 C) 98  F (36.7 C) 97.8 F (36.6 C) 97.9 F (36.6 C)  TempSrc:    Oral  SpO2: 100% 95% 97% 97%  Weight:      Height:         Author: Drue ONEIDA Potter, MD 07/18/2024 4:51 PM  For on call review [email or web address].

## 2024-07-19 DIAGNOSIS — I482 Chronic atrial fibrillation, unspecified: Secondary | ICD-10-CM | POA: Diagnosis not present

## 2024-07-19 LAB — CBC WITH DIFFERENTIAL/PLATELET
Abs Immature Granulocytes: 0.02 K/uL (ref 0.00–0.07)
Basophils Absolute: 0 K/uL (ref 0.0–0.1)
Basophils Relative: 0 %
Eosinophils Absolute: 0.2 K/uL (ref 0.0–0.5)
Eosinophils Relative: 3 %
HCT: 43.9 % (ref 39.0–52.0)
Hemoglobin: 13.3 g/dL (ref 13.0–17.0)
Immature Granulocytes: 0 %
Lymphocytes Relative: 34 %
Lymphs Abs: 2.2 K/uL (ref 0.7–4.0)
MCH: 29.5 pg (ref 26.0–34.0)
MCHC: 30.3 g/dL (ref 30.0–36.0)
MCV: 97.3 fL (ref 80.0–100.0)
Monocytes Absolute: 0.7 K/uL (ref 0.1–1.0)
Monocytes Relative: 11 %
Neutro Abs: 3.4 K/uL (ref 1.7–7.7)
Neutrophils Relative %: 52 %
Platelets: 228 K/uL (ref 150–400)
RBC: 4.51 MIL/uL (ref 4.22–5.81)
RDW: 16.4 % — ABNORMAL HIGH (ref 11.5–15.5)
WBC: 6.5 K/uL (ref 4.0–10.5)
nRBC: 0 % (ref 0.0–0.2)

## 2024-07-19 LAB — BASIC METABOLIC PANEL WITH GFR
Anion gap: 8 (ref 5–15)
BUN: 36 mg/dL — ABNORMAL HIGH (ref 8–23)
CO2: 23 mmol/L (ref 22–32)
Calcium: 10.1 mg/dL (ref 8.9–10.3)
Chloride: 106 mmol/L (ref 98–111)
Creatinine, Ser: 1.99 mg/dL — ABNORMAL HIGH (ref 0.61–1.24)
GFR, Estimated: 35 mL/min — ABNORMAL LOW (ref >60)
Glucose, Bld: 106 mg/dL — ABNORMAL HIGH (ref 70–99)
Potassium: 4.9 mmol/L (ref 3.5–5.1)
Sodium: 136 mmol/L (ref 135–145)

## 2024-07-19 MED ORDER — METOPROLOL SUCCINATE ER 50 MG PO TB24
50.0000 mg | ORAL_TABLET | Freq: Once | ORAL | Status: AC
  Administered 2024-07-19: 50 mg via ORAL
  Filled 2024-07-19: qty 1

## 2024-07-19 MED ORDER — METOPROLOL TARTRATE 5 MG/5ML IV SOLN
5.0000 mg | Freq: Once | INTRAVENOUS | Status: AC | PRN
  Administered 2024-07-19: 5 mg via INTRAVENOUS
  Filled 2024-07-19: qty 5

## 2024-07-19 NOTE — Progress Notes (Signed)
 Progress Note   Patient: Matthew [name] FMW:[id number] DOB: November 16, 1950 DOA: 07/15/2024     3 DOS: the patient was seen and examined on 07/19/2024   Brief hospital course: From HPI RYKKER COVIELLO is a 73 y.o. male with medical history significant of dementia, A-fib on Eliquis , heart failure with reduced ejection fraction, referred to the emergency department from eye surgery center after he was found to be in A-fib with RVR.  Patient is asymptomatic.  An identical presentation in October.   In the Emergency department, rate was initially  130-140.  He was given 25 mg metoprolol  and 5 mg of IV metoprolol  with improvement in his rate. His normal tensive, afebrile, without leukocytosis.  Potassium 5.7, creatinine appears to be at baseline around 2.  He was given IV Lasix  and 10 g of Lokelma , hospitalist consulted to admit.       Assessment and Plan:    Persistent A-fib RVR. - Asymptomatic, hemodynamically stable. Per d/c summary from October, per Cardiology,  pt not appropriate for cardioversion due to medication non-adherence -Rate controlled after oral metoprolol  given in the emergency department Continue Eliquis  as well as metoprolol  Monitor on telemetry closely PT OT has seen patient with recommendation for acute rehab   HFrEF - clinically euvolemic despite elevated BMP, chest x-ray unremarkable, saturating appropriately on room air. - Continue home diuretics, metoprolol  We will add other GDMT as blood pressure allows   Hyperkalemia CKD4- stable  Status post a dose of Lokelma  Continue to monitor closely   Skin lesion Nodular lesion noted to the right side of the neck Patient counseled to have outpatient dermatology follow-up   Disposition: Pending SNF   Advance Care Planning:   Code Status: Prior        Subjective:  Patient denies any acute overnight event Currently medically stable awaiting rehab   Physical Exam:   HENT:     Head: Normocephalic.  Eyes:      General: No scleral icterus.    Pupils: Pupils are equal, round, and reactive to light.  Cardiovascular:     Rate and Rhythm: Tachycardia resolved Pulmonary:     Effort: Pulmonary effort is normal. No respiratory distress.     Breath sounds: No wheezing.  Abdominal:     General: There is no distension.     Palpations: Abdomen is soft.     Tenderness: There is no abdominal tenderness.  Musculoskeletal:     Cervical back: Neck supple.     Right lower leg: No edema.     Left lower leg: No edema.  Skin:    General: Skin is warm and dry.     Data Reviewed:   Vitals:   07/19/24 0400 07/19/24 0432 07/19/24 0615 07/19/24 1204  BP:  135/79  108/73  Pulse:  (!) 142  96  Resp: (!) 23 18 (!) 22 16  Temp:  98 F (36.7 C)  97.6 F (36.4 C)  TempSrc:  Oral    SpO2:  93%  94%  Weight:      Height:          Latest Ref Rng & Units 07/19/2024    4:58 AM 07/18/2024    4:46 AM 07/17/2024    4:06 AM  BMP  Glucose 70 - 99 mg/dL 893  888  89   BUN 8 - 23 mg/dL 36  37  35   Creatinine 0.61 - 1.24 mg/dL 8.00  7.87  8.03   Sodium 135 - 145 mmol/L  136  138  137   Potassium 3.5 - 5.1 mmol/L 4.9  5.0  5.0   Chloride 98 - 111 mmol/L 106  108  107   CO2 22 - 32 mmol/L 23  24  21    Calcium  8.9 - 10.3 mg/dL 89.8  9.7  9.6        Latest Ref Rng & Units 07/19/2024    4:58 AM 07/18/2024    4:46 AM 07/17/2024    4:06 AM  CBC  WBC 4.0 - 10.5 K/uL 6.5  5.6  6.4   Hemoglobin 13.0 - 17.0 g/dL 86.6  87.0  87.0   Hematocrit 39.0 - 52.0 % 43.9  43.6  41.9   Platelets 150 - 400 K/uL 228  194  227      Author: Drue ONEIDA Potter, MD 07/19/2024 6:50 PM  For on call review [email or web address].

## 2024-07-19 NOTE — Plan of Care (Signed)

## 2024-07-20 DIAGNOSIS — I4811 Longstanding persistent atrial fibrillation: Secondary | ICD-10-CM

## 2024-07-20 LAB — CBC WITH DIFFERENTIAL/PLATELET
Abs Immature Granulocytes: 0.01 K/uL (ref 0.00–0.07)
Basophils Absolute: 0 K/uL (ref 0.0–0.1)
Basophils Relative: 1 %
Eosinophils Absolute: 0.2 K/uL (ref 0.0–0.5)
Eosinophils Relative: 5 %
HCT: 43.6 % (ref 39.0–52.0)
Hemoglobin: 13.3 g/dL (ref 13.0–17.0)
Immature Granulocytes: 0 %
Lymphocytes Relative: 39 %
Lymphs Abs: 2 K/uL (ref 0.7–4.0)
MCH: 29.9 pg (ref 26.0–34.0)
MCHC: 30.5 g/dL (ref 30.0–36.0)
MCV: 98 fL (ref 80.0–100.0)
Monocytes Absolute: 0.6 K/uL (ref 0.1–1.0)
Monocytes Relative: 12 %
Neutro Abs: 2.3 K/uL (ref 1.7–7.7)
Neutrophils Relative %: 43 %
Platelets: 189 K/uL (ref 150–400)
RBC: 4.45 MIL/uL (ref 4.22–5.81)
RDW: 16.4 % — ABNORMAL HIGH (ref 11.5–15.5)
WBC: 5.3 K/uL (ref 4.0–10.5)
nRBC: 0 % (ref 0.0–0.2)

## 2024-07-20 LAB — BASIC METABOLIC PANEL WITH GFR
Anion gap: 9 (ref 5–15)
BUN: 40 mg/dL — ABNORMAL HIGH (ref 8–23)
CO2: 21 mmol/L — ABNORMAL LOW (ref 22–32)
Calcium: 10.6 mg/dL — ABNORMAL HIGH (ref 8.9–10.3)
Chloride: 106 mmol/L (ref 98–111)
Creatinine, Ser: 2.22 mg/dL — ABNORMAL HIGH (ref 0.61–1.24)
GFR, Estimated: 30 mL/min — ABNORMAL LOW (ref >60)
Glucose, Bld: 100 mg/dL — ABNORMAL HIGH (ref 70–99)
Potassium: 5.8 mmol/L — ABNORMAL HIGH (ref 3.5–5.1)
Sodium: 136 mmol/L (ref 135–145)

## 2024-07-20 MED ORDER — FLUTICASONE FUROATE-VILANTEROL 200-25 MCG/ACT IN AEPB
1.0000 | INHALATION_SPRAY | Freq: Every day | RESPIRATORY_TRACT | Status: DC
  Administered 2024-07-20: 1 via RESPIRATORY_TRACT
  Filled 2024-07-20: qty 28

## 2024-07-20 MED ORDER — SERTRALINE HCL 50 MG PO TABS
125.0000 mg | ORAL_TABLET | Freq: Every day | ORAL | Status: DC
  Administered 2024-07-20: 125 mg via ORAL
  Filled 2024-07-20: qty 3

## 2024-07-20 MED ORDER — CALCITRIOL 0.25 MCG PO CAPS
0.2500 ug | ORAL_CAPSULE | Freq: Every day | ORAL | Status: DC
  Administered 2024-07-20: 0.25 ug via ORAL
  Filled 2024-07-20: qty 1

## 2024-07-20 MED ORDER — SERTRALINE HCL 50 MG PO TABS: 25.0000 mg | ORAL_TABLET | Freq: Every day | ORAL | Status: DC

## 2024-07-20 MED ORDER — SODIUM ZIRCONIUM CYCLOSILICATE 10 G PO PACK
10.0000 g | PACK | Freq: Once | ORAL | Status: AC
  Administered 2024-07-20: 10 g via ORAL
  Filled 2024-07-20: qty 1

## 2024-07-20 MED ORDER — DAPAGLIFLOZIN PROPANEDIOL 10 MG PO TABS
10.0000 mg | ORAL_TABLET | Freq: Every day | ORAL | Status: DC
  Administered 2024-07-20: 10 mg via ORAL
  Filled 2024-07-20: qty 1

## 2024-07-20 MED ORDER — IPRATROPIUM-ALBUTEROL 0.5-2.5 (3) MG/3ML IN SOLN
3.0000 mL | RESPIRATORY_TRACT | Status: DC | PRN
  Administered 2024-07-20: 3 mL via RESPIRATORY_TRACT
  Filled 2024-07-20: qty 3

## 2024-07-20 MED ORDER — LATANOPROST 0.005 % OP SOLN
1.0000 [drp] | Freq: Every day | OPHTHALMIC | Status: DC
  Filled 2024-07-20: qty 2.5

## 2024-07-20 MED ORDER — TRAMADOL HCL 50 MG PO TABS: 50.0000 mg | ORAL_TABLET | Freq: Two times a day (BID) | ORAL | 0 refills | Status: AC | PRN

## 2024-07-20 MED ORDER — ALBUTEROL SULFATE (2.5 MG/3ML) 0.083% IN NEBU
2.5000 mg | INHALATION_SOLUTION | Freq: Every day | RESPIRATORY_TRACT | Status: DC
  Administered 2024-07-20: 2.5 mg via RESPIRATORY_TRACT
  Filled 2024-07-20: qty 3

## 2024-07-20 NOTE — Plan of Care (Signed)

## 2024-07-20 NOTE — TOC Transition Note (Signed)
 Transition of Care [facility]) - Discharge Note   Patient Details  Name: Matthew [name] MRN: [id number] Date of Birth: 04-26-1951  Transition [facility] Care [facility] CM/SW Contact:  Victory Jackquline RAMAN, RN Phone Number: 07/20/2024, 11:13 AM   Clinical Narrative:   [phone]: RNCM received a message from the MD via secure chat letting me know that the patient was medically stable for discharge today and wanted me to check to see if the facility would take patient back today.  [phone]: RNCM spoke to austin with [facility] and he informed me that the patient could return today to Rm# [id number] and to set up transportation for 3pm.  The patient is discharging back to [facility] to Rm# [id number]. Nurse to call report to [phone]. RNCM called patient's daughter [name] @ [phone] and left a message telling her that her dad was being discharged back to [place] today. Transportation set up with [facility] and they will be here around 3pm or a little after. No further concerns. RNCM signing off.    Final next level of care: Skilled Nursing Facility Barriers to Discharge: Barriers Resolved   Patient Goals and CMS Choice            Discharge Placement                Patient to be transferred to facility by: [facility]   Patient and family notified of of transfer: 07/20/24  Discharge Plan and Services Additional resources added to the After Visit Summary for                                       Social Drivers of Health (SDOH) Interventions SDOH Screenings   Food Insecurity: No Food Insecurity (07/16/2024)  Housing: High Risk (07/16/2024)  Transportation Needs: No Transportation Needs (07/16/2024)  Utilities: Not At Risk (07/16/2024)  Social Connections: Moderately Integrated (07/16/2024)  Tobacco Use: Low Risk  (07/16/2024)     Readmission Risk Interventions    05/04/2022   10:42 AM  Readmission Risk Prevention Plan  HRI or Home Care Consult Complete  Palliative  Care Screening Complete  Medication Review (RN Care Manager) Complete

## 2024-07-20 NOTE — Discharge Summary (Signed)
 Physician Discharge Summary  GREEN QUINCY FMW:[id number] DOB: 1950-12-11 DOA: 07/15/2024  PCP: Britta King, MD  Admit date: 07/15/2024 Discharge date: 07/20/2024  Admitted From: SNF Disposition:  SNF  Recommendations for Outpatient Follow-up:  Follow up with PCP in 1-2 weeks   Home Health:No  Equipment/Devices:None   Discharge Condition:Stable  CODE STATUS:FULL  Diet recommendation: Heart healthy  Brief/Interim Summary:  From HPI Matthew [name] is a 73 y.o. male with medical history significant of dementia, A-fib on Eliquis , heart failure with reduced ejection fraction, referred to the emergency department from eye surgery center after he was found to be in A-fib with RVR.  Patient is asymptomatic.  An identical presentation in October.   In the Emergency department, rate was initially  130-140.  He was given 25 mg metoprolol  and 5 mg of IV metoprolol  with improvement in his rate. His normal tensive, afebrile, without leukocytosis.  Potassium 5.7, creatinine appears to be at baseline around 2.  He was given IV Lasix  and 10 g of Lokelma , hospitalist consulted to admit.      Discharge Diagnoses:  Principal Problem:   A-fib (HCC)   Persistent A-fib RVR. - Asymptomatic, hemodynamically stable. Per d/c summary from October, per Cardiology,  pt not appropriate for cardioversion due to medication non-adherence -Rate controlled after oral metoprolol  given in the emergency department Continue Eliquis  as well as metoprolol  Monitor on telemetry closely PT OT has seen patient with recommendation for acute rehab Accepted to [facility] Discharged in stable condition Resume home metop 50 daily and eliquis  5 BID   HFrEF - clinically euvolemic despite elevated BMP, chest x-ray unremarkable, saturating appropriately on room air. - Continue home diuretics, metoprolol    Hyperkalemia CKD4- stable  Status post 2 dose of Lokelma   Discharge Instructions  Discharge Instructions     Diet -  low sodium heart healthy   Complete by: As directed    Increase activity slowly   Complete by: As directed    No wound care   Complete by: As directed       Allergies as of 07/20/2024       Reactions   Demerol [meperidine Hcl]    Lisinopril  Other (See Comments)   Hypotensive    Meperidine And Related    Talwin [pentazocine]         Medication List     TAKE these medications    acetaminophen  500 MG tablet Commonly known as: TYLENOL  Take 1,000 mg by mouth 3 (three) times daily.   albuterol  108 (90 Base) MCG/ACT inhaler Commonly known as: VENTOLIN  HFA INHALE 1 PUFF BY MOUTH INTO LUNGS DAILY   apixaban  5 MG Tabs tablet Commonly known as: Eliquis  Take 1 tablet (5 mg total) by mouth 2 (two) times daily.   bisacodyl  5 MG EC tablet Commonly known as: DULCOLAX Take 1 tablet (5 mg total) by mouth daily as needed for moderate constipation.   busPIRone  15 MG tablet Commonly known as: BUSPAR  Take 15 mg by mouth 2 (two) times daily.   calcitRIOL  0.25 MCG capsule Commonly known as: ROCALTROL  Take 0.25 mcg by mouth daily.   dapagliflozin  propanediol 10 MG Tabs tablet Commonly known as: FARXIGA  Take 1 tablet (10 mg total) by mouth daily.   diclofenac Sodium 1 % Gel Commonly known as: VOLTAREN Apply 1 Application topically 3 (three) times daily.   ferrous sulfate  325 (65 FE) MG EC tablet Take 325 mg by mouth every Monday, Wednesday, and Friday.   furosemide  20 MG tablet Commonly known  as: LASIX  Take 20 mg by mouth daily.   ipratropium-albuterol  0.5-2.5 (3) MG/3ML Soln Commonly known as: DUONEB USE 1 VIAL VIA NEBULIZER EVERY 6 HOURS AS NEEDED   lamoTRIgine  25 MG tablet Commonly known as: LAMICTAL  Take 50 mg by mouth daily.   latanoprost  0.005 % ophthalmic solution Commonly known as: XALATAN  Place 1 drop into both eyes at bedtime.   levothyroxine  25 MCG tablet Commonly known as: SYNTHROID  Take 25 mcg by mouth daily.   Lidocaine  HCl 4 % Ptch Apply  topically. Apply 1 patch to left knee topically one time a day for osteoarthritis pain   metoprolol  succinate 50 MG 24 hr tablet Commonly known as: TOPROL -XL Take 1 tablet (50 mg total) by mouth daily. Take with or immediately following a meal. What changed:  when to take this Another medication with the same name was removed. Continue taking this medication, and follow the directions you see here.   nitroGLYCERIN  0.4 MG SL tablet Commonly known as: NITROSTAT  Place 0.4 mg under the tongue every 5 (five) minutes as needed for chest pain.   sertraline  100 MG tablet Commonly known as: ZOLOFT  Take 100 mg by mouth daily. (Take with 25mg  tablet)   sertraline  25 MG tablet Commonly known as: ZOLOFT  Take 25 mg by mouth daily. (Take with 100mg  tablet)   traMADol  50 MG tablet Commonly known as: ULTRAM  Take 1 tablet (50 mg total) by mouth every 12 (twelve) hours as needed. SNF use only.  Refills per SNF MD What changed: additional instructions   traZODone  50 MG tablet Commonly known as: DESYREL  Take 1.5 tablets by mouth at bedtime.   Wixela Inhub 500-50 MCG/ACT Aepb Generic drug: fluticasone -salmeterol Inhale 1 puff into the lungs in the morning and at bedtime.        Follow-up Information     [facility]. Go on 08/04/2024.   Specialty: Cardiology Why: Hospital Follow-Up 08/04/24 @ 12:00 Please send him with a list of his medications to follow-up appointment [facility] [address] Contact information: [address] [place]  [address] [phone]               Allergies  Allergen Reactions   Demerol [Meperidine Hcl]    Lisinopril  Other (See Comments)    Hypotensive    Meperidine And Related    Talwin [Pentazocine]     Consultations: None   Procedures/Studies: DG Chest Portable 1 View Result Date: 07/15/2024 CLINICAL DATA:  AFib. EXAM: PORTABLE CHEST 1 VIEW  COMPARISON:  06/02/2024. FINDINGS: The heart size and mediastinal contours are unchanged. Mild retrocardiac opacity, improved from the prior exam, could reflect atelectasis or infiltrate. Mild streaky opacity at the right lateral lung base. No sizable pleural effusion or pneumothorax. Remote right sixth rib fracture. Degenerative changes of the bilateral glenohumeral joints. IMPRESSION: Mild retrocardiac opacity, improved from the prior exam, and streaky opacity at the right lateral lung base, which could reflect atelectasis or infiltrate. Electronically Signed   By: Harrietta Sherry M.D.   On: 07/15/2024 13:04      Subjective:   Discharge Exam: Vitals:   07/20/24 0446 07/20/24 0735  BP: 117/83 108/88  Pulse: 93 84  Resp: 20 18  Temp: 98.1 F (36.7 C) 97.7 F (36.5 C)  SpO2: 94% 90%   Vitals:   07/19/24 2031 07/20/24 0003 07/20/24 0446 07/20/24 0735  BP: 114/83 105/81 117/83 108/88  Pulse: 86 69 93 84  Resp: 18 20 20  18  Temp: 98 F (36.7 C) 97.6 F (36.4 C) 98.1 F (36.7 C) 97.7 F (36.5 C)  TempSrc:      SpO2: 96% 99% 94% 90%  Weight:      Height:        General: Pt is alert, awake, not in acute distress Cardiovascular: RRR, S1/S2 +, no rubs, no gallops Respiratory: CTA bilaterally, no wheezing, no rhonchi Abdominal: Soft, NT, ND, bowel sounds + Extremities: no edema, no cyanosis    The results of significant diagnostics from this hospitalization (including imaging, microbiology, ancillary and laboratory) are listed below for reference.     Microbiology: No results found for this or any previous visit (from the past 240 hours).   Labs: BNP (last 3 results) Recent Labs    05/28/24 0435 05/29/24 1109  BNP 158.5* 287.6*   Basic Metabolic Panel: Recent Labs  Lab 07/15/24 1235 07/16/24 0644 07/17/24 0406 07/18/24 0446 07/19/24 0458 07/20/24 0758  NA 141 139 137 138 136 136  K 5.7* 4.6 5.0 5.0 4.9 5.8*  CL 113* 108 107 108 106 106  CO2 21* 21* 21* 24  23 21*  GLUCOSE 96 91 89 111* 106* 100*  BUN 31* 35* 35* 37* 36* 40*  CREATININE 2.10* 2.12* 1.96* 2.12* 1.99* 2.22*  CALCIUM  10.0 9.6 9.6 9.7 10.1 10.6*  MG 2.3  --   --   --   --   --    Liver Function Tests: No results for input(s): AST, ALT, ALKPHOS, BILITOT, PROT, ALBUMIN in the last 168 hours. No results for input(s): LIPASE, AMYLASE in the last 168 hours. No results for input(s): AMMONIA in the last 168 hours. CBC: Recent Labs  Lab 07/15/24 1235 07/16/24 0644 07/17/24 0406 07/18/24 0446 07/19/24 0458 07/20/24 0522  WBC 7.1 7.1 6.4 5.6 6.5 5.3  NEUTROABS 4.1  --  2.9 2.5 3.4 2.3  HGB 13.6 13.1 12.9* 12.9* 13.3 13.3  HCT 44.7 43.0 41.9 43.6 43.9 43.6  MCV 99.3 97.1 96.5 99.3 97.3 98.0  PLT 269 240 227 194 228 189   Cardiac Enzymes: No results for input(s): CKTOTAL, CKMB, CKMBINDEX, TROPONINI in the last 168 hours. BNP: Invalid input(s): POCBNP CBG: Recent Labs  Lab 07/17/24 2105  GLUCAP 105*   D-Dimer No results for input(s): DDIMER in the last 72 hours. Hgb A1c No results for input(s): HGBA1C in the last 72 hours. Lipid Profile No results for input(s): CHOL, HDL, LDLCALC, TRIG, CHOLHDL, LDLDIRECT in the last 72 hours. Thyroid  function studies No results for input(s): TSH, T4TOTAL, T3FREE, THYROIDAB in the last 72 hours.  Invalid input(s): FREET3 Anemia work up No results for input(s): VITAMINB12, FOLATE, FERRITIN, TIBC, IRON, RETICCTPCT in the last 72 hours. Urinalysis    Component Value Date/Time   COLORURINE YELLOW (A) 05/03/2022 0902   APPEARANCEUR HAZY (A) 05/03/2022 0902   LABSPEC 1.008 05/03/2022 0902   PHURINE 5.0 05/03/2022 0902   GLUCOSEU NEGATIVE 05/03/2022 0902   HGBUR LARGE (A) 05/03/2022 0902   BILIRUBINUR NEGATIVE 05/03/2022 0902   KETONESUR NEGATIVE 05/03/2022 0902   PROTEINUR NEGATIVE 05/03/2022 0902   NITRITE NEGATIVE 05/03/2022 0902   LEUKOCYTESUR TRACE (A)  05/03/2022 0902   Sepsis Labs Recent Labs  Lab 07/17/24 0406 07/18/24 0446 07/19/24 0458 07/20/24 0522  WBC 6.4 5.6 6.5 5.3   Microbiology No results found for this or any previous visit (from the past 240 hours).   Time coordinating discharge: 40 minutes   SIGNED:   Calvin KATHEE Robson, MD  [facility] 07/20/2024, 10:03 AM Pager   If 7PM-7AM, please contact night-coverage

## 2024-08-01 ENCOUNTER — Telehealth: Payer: Self-pay | Admitting: Family

## 2024-08-01 NOTE — Telephone Encounter (Signed)
 Called to confirm/remind patient of their appointment [facility] on 08/04/24.   Appointment:   [] Confirmed  [x] Left mess   [] No answer/No voice mail  [] VM Full/unable to leave message  [] Phone not in service  Patient reminded to bring all medications and/or complete list.  Confirmed patient has transportation. Gave directions, instructed to utilize valet parking.

## 2024-08-04 ENCOUNTER — Ambulatory Visit (HOSPITAL_COMMUNITY): Payer: Self-pay | Admitting: Cardiology

## 2024-08-04 ENCOUNTER — Ambulatory Visit

## 2024-08-04 ENCOUNTER — Other Ambulatory Visit
Admission: RE | Admit: 2024-08-04 | Discharge: 2024-08-04 | Disposition: A | Discharge status: Home / Self Care | Source: Ambulatory Visit | Attending: Cardiology | Admitting: Cardiology

## 2024-08-04 VITALS — BP 98/60 | HR 88 | Wt 241.8 lb

## 2024-08-04 DIAGNOSIS — F03C0 Unspecified dementia, severe, without behavioral disturbance, psychotic disturbance, mood disturbance, and anxiety: Secondary | ICD-10-CM

## 2024-08-04 DIAGNOSIS — I5022 Chronic systolic (congestive) heart failure: Secondary | ICD-10-CM | POA: Insufficient documentation

## 2024-08-04 DIAGNOSIS — I4891 Unspecified atrial fibrillation: Secondary | ICD-10-CM | POA: Diagnosis not present

## 2024-08-04 DIAGNOSIS — E039 Hypothyroidism, unspecified: Secondary | ICD-10-CM

## 2024-08-04 DIAGNOSIS — I5032 Chronic diastolic (congestive) heart failure: Secondary | ICD-10-CM | POA: Diagnosis not present

## 2024-08-04 DIAGNOSIS — N184 Chronic kidney disease, stage 4 (severe): Secondary | ICD-10-CM

## 2024-08-04 LAB — BASIC METABOLIC PANEL WITH GFR
Anion gap: 12 (ref 5–15)
BUN: 24 mg/dL — ABNORMAL HIGH (ref 8–23)
CO2: 19 mmol/L — ABNORMAL LOW (ref 22–32)
Calcium: 10.4 mg/dL — ABNORMAL HIGH (ref 8.9–10.3)
Chloride: 112 mmol/L — ABNORMAL HIGH (ref 98–111)
Creatinine, Ser: 1.78 mg/dL — ABNORMAL HIGH (ref 0.61–1.24)
GFR, Estimated: 40 mL/min — ABNORMAL LOW
Glucose, Bld: 111 mg/dL — ABNORMAL HIGH (ref 70–99)
Potassium: 4.9 mmol/L (ref 3.5–5.1)
Sodium: 142 mmol/L (ref 135–145)

## 2024-08-04 LAB — PRO BRAIN NATRIURETIC PEPTIDE: Pro Brain Natriuretic Peptide: 2891 pg/mL — ABNORMAL HIGH

## 2024-08-04 MED ORDER — AMIODARONE HCL 200 MG PO TABS: 200.0000 mg | ORAL_TABLET | Freq: Two times a day (BID) | ORAL | 1 refills | Status: DC

## 2024-08-04 MED ORDER — FUROSEMIDE 20 MG PO TABS: 40.0000 mg | ORAL_TABLET | Freq: Every day | ORAL | 3 refills | Status: DC

## 2024-08-04 NOTE — Progress Notes (Signed)
 "   Advanced Heart Failure Clinic Note   PCP: Britta King, MD PCP-Cardiologist: Lonni Hanson, MD   HPI: Matthew [name] is a 73 y.o. male with a history of chronic HFrEF, CVA. Afib, dialated cardiomyopathy, HTN, asthma, COPD, OSA, CKD stage 4, dementia, bipolar disorder, depression, hypothyroidism, OA, gout, alcohol  abuse, and obesity.   Echo 3/20 EF >65% and [facility] 3/20 Normal LV function and arteries.   Echo 10/22 EF 50-55%, LV function low-nornal, RV normal, and MR.   Echo 3/23 EF 35-40% LV function moderately decreased and dialated,, RV normal, mild MR, and atrial pressure of 8. TEE 3/23 EF 35-40% LV function moderate to severely decreased, RV low-normal and mildly enlarged, mild MR and TR. Cardioversion 3/23.   Admitted 10/25 following appointment at eye surgery center where he was to be in Afib with RVR. Given IV diltiazem  via EMS and started on diltiazem  in ED. Echo showed EF 35-40%, LV function moderately decreased and RV normal. CT head with mild chronic small vessel ischemic disease. CT Neck showed no occlusions. Discharged to SNF on coreg  6.25mg  BID and eliquis  continued.   Last seen in [facility] 10/25, had been short of breath with some weakness, remained in AF. Seen by Dr. Hanson 07/02/24 had been making some improvements with DOE in AF. Unfortunately he was admitted 12/25 from SNF with AF RVR, on arrival to ED, he was in rate controlled AF. He was discharged back to SNF.   He returns today for HF follow up by wheelchair. Overall feeling okay, but short of breath with any activity. ROS limited by dementia. Reports dyspnea, fatigue, and cough. Denies chest pain, near-syncope, orthopnea, and abnormal bleeding. He also reports that he is not on any medications, however he is in SNF where is medications are managed by staff.  Past Medical History:  Diagnosis Date   Alcohol  abuse, in remission    Anemia    Anxiety    Arrhythmia    atrial fibrillation   Arthritis    Asthma    Atrial  fibrillation (HCC)    Bipolar disorder (HCC)    CAD (coronary artery disease)    Cardiac LV ejection fraction 30-35%    CHF (congestive heart failure) (HCC)    Chronic kidney disease, stage 4 (severe) (HCC)    Chronic pain    Chronic systolic (congestive) heart failure (HCC)    COPD (chronic obstructive pulmonary disease) (HCC)    Depression    Dysrhythmia    Afib   Edema    Epilepsy, unspecified, not intractable, without status epilepticus (HCC)    Global hypokinesis of left ventricle    Gout    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     albuterol  (VENTOLIN  HFA) 108 (90 Base) MCG/ACT inhaler INHALE 1 PUFF BY MOUTH INTO LUNGS DAILY 18 g 3   apixaban  (ELIQUIS ) 5 MG TABS tablet Take 1 tablet (5 mg total) by mouth  2 (two) times daily. 60 tablet 2   bisacodyl  (DULCOLAX) 5 MG EC tablet Take 1 tablet (5 mg total) by mouth daily as needed for moderate constipation. 30 tablet 3   busPIRone  (BUSPAR ) 15 MG tablet Take 15 mg by mouth 2 (two) times daily.     calcitRIOL  (ROCALTROL ) 0.25 MCG capsule Take 0.25 mcg by mouth daily.     dapagliflozin  propanediol (FARXIGA ) 10 MG TABS tablet Take 1 tablet (10 mg total) by mouth daily.     diclofenac Sodium (VOLTAREN) 1 % GEL Apply 1 Application topically 3 (three) times daily.     ferrous sulfate  325 (65 FE) MG EC tablet Take 325 mg by mouth every Monday, Wednesday, and Friday.     furosemide  (LASIX ) 20 MG tablet Take 20 mg by mouth daily.      ipratropium-albuterol  (DUONEB) 0.5-2.5 (3) MG/3ML SOLN USE 1 VIAL VIA NEBULIZER EVERY 6 HOURS AS NEEDED 3 mL 6   lamoTRIgine  (LAMICTAL ) 25 MG tablet Take 50 mg by mouth daily.     latanoprost  (XALATAN ) 0.005 % ophthalmic solution Place 1 drop into both eyes at bedtime.     levothyroxine  (SYNTHROID ) 25 MCG tablet Take 25 mcg by mouth daily.     Lidocaine  HCl 4 % PTCH Apply topically. Apply 1 patch to left knee topically one time a day for osteoarthritis pain     metoprolol  succinate (TOPROL -XL) 50 MG 24 hr tablet Take 1 tablet (50 mg total) by mouth daily. Take with or immediately following a meal.     nitroGLYCERIN  (NITROSTAT ) 0.4 MG SL tablet Place 0.4 mg under the tongue every 5 (five) minutes as needed for chest pain.     sertraline  (ZOLOFT ) 100 MG tablet Take 100 mg by mouth daily. (Take with 25mg  tablet)     sertraline  (ZOLOFT ) 25 MG tablet Take 25 mg by mouth daily. (Take with 100mg  tablet)     traMADol  (ULTRAM ) 50 MG tablet Take 1 tablet (50 mg total) by mouth every 12 (twelve) hours as needed. SNF use only.  Refills per SNF MD 2 tablet 0   traZODone  (DESYREL ) 50 MG tablet Take 1.5 tablets by mouth at bedtime.     fluticasone -salmeterol (WIXELA INHUB) 500-50 MCG/ACT AEPB Inhale 1 puff into the lungs in the morning and at bedtime. (Patient not taking: Reported on 08/04/2024)     No current facility-administered medications for this visit.    Allergies  Allergen Reactions   Demerol [Meperidine Hcl]    Lisinopril  Other (See Comments)    Hypotensive    Meperidine And Related    Talwin [Pentazocine]       Social History   Socioeconomic History   Marital status: Widowed    Spouse name: Not on file   Number of children: 1   Years of education: Not on file   Highest education level: Not on file  Occupational History   Not on file  Tobacco Use   Smoking status: Never   Smokeless tobacco: Never  Vaping Use   Vaping status: Never Used  Substance and Sexual Activity   Alcohol   use: Not Currently    Alcohol /week: 105.0 standard drinks of alcohol     Types: 105 Cans of beer per week    Comment: Drinks 15 beers a night in the bar.  Says he doesnt drink at home but goes to the bar everynight.   Drug use: Not Currently   Sexual activity: Not on file  Other Topics Concern   Not on file  Social History Narrative   Not on file   Social Drivers of Health   Tobacco Use: Low Risk (07/16/2024)   Patient History    Smoking Tobacco Use: Never    Smokeless Tobacco Use: Never    Passive Exposure: Not on file  Financial Resource Strain: Not on file  Food Insecurity: No Food Insecurity (07/16/2024)   Epic    Worried About Programme Researcher, Broadcasting/film/video in the Last Year: Never true    Ran Out of Food in the Last Year: Never true  Transportation Needs: No Transportation Needs (07/16/2024)   Epic    Lack of Transportation (Medical): No    Lack of Transportation (Non-Medical): No  Physical Activity: Not on file  Stress: Not on file  Social Connections: Moderately Integrated (07/16/2024)   Social Connection and Isolation Panel    Frequency of Communication with Friends and Family: Twice a week    Frequency of Social Gatherings with Friends and Family: Once a week    Attends Religious Services: More than 4 times per year    Active Member of [employer] or Organizations: Yes    Attends Banker Meetings: 1 to 4 times per year    Marital Status: Divorced  Intimate Partner Violence: Not At Risk (07/16/2024)   Epic    Fear of Current or Ex-Partner: No    Emotionally Abused: No    Physically Abused: No    Sexually Abused: No  Depression (PHQ2-9): Not on file  Alcohol  Screen: Not on file  Housing: High Risk (07/16/2024)   Epic    Unable to Pay for Housing in the Last Year: Yes    Number of Times Moved in the Last Year: 0    Homeless in the Last Year: No  Utilities: Not At Risk (07/16/2024)   Epic    Threatened with loss of utilities: No  Health Literacy: Not on file       Family History  Problem Relation Age of Onset   COPD Mother    Cancer Mother    Melanoma Father    Vitals:   08/04/24 1212  BP: 98/60  Pulse: 88  SpO2: 99%  Weight: 241 lb 12.8 oz (109.7 kg)    Wt Readings from Last 3 Encounters:  08/04/24 241 lb 12.8 oz (109.7 kg)  07/15/24 247 lb 15.9 oz (112.5 kg)  07/15/24 248 lb (112.5 kg)   PHYSICAL EXAM: General: Haggard appearing. No distress  Cardiac: JVP difficult to assess. No murmurs  Resp: Lung sounds diminished throughout Extremities: Warm and dry.  Trace BLE edema.  Neuro: A&O x2. Affect pleasant. Very forgetful, information inaccurate   ECG (personally reviewed): AF RVR 129 bpm  ASSESSMENT & PLAN:  Chronic HFpEF, now HFrEF, Dilated CM - Suspect tachy-mediated CM, although poor options for rhythm control with noncompliance - Echo 3/20 EF >65% and [facility] 3/20 Normal LV function and arteries.  - Drop in EF 3/25: 35-40%, dilated LV, RV ok. Stable, unchanged on echo 10/25. - ?AF contributing to drop in EF - NYHA class IIIb. Volume up on exam.  - GDMT limited by CKD IV. - increase lasix  to 40mg  daily  - continue farxiga  10mg  daily, may need to stop in the future d/t hygiene - BMET/BNP today   2. Persistent Afib  - s/p DCCV 3/23. - Now back in AF. Dr. Mady considering repeat DCCV, patient reports noncompliance, however is in SNF where meds are managed by staff. Defer need for DCCV to Dr. Mady, as  patient has significant limited functional capacity with advanced dementia - uncontrolled rate, patient unaware, ?contributing to dyspnea  - start amio 200 mg bid, for rate control, decrease dose to 200 mg daily at follow up - continue eliquis  5 mg daily  - continue toprol  XL 50 mg daily  3. CKD stage 4  - saw neprhology Geoffry) 06/25 - BMET today   4. Hypothyroidism  - continue synthroid   - will need repeat TSH, free T4, LFTs at follow up  5. Dementia  - limited HPI due to dementia   Follow up in 2 weeks, high risk for  readmission  Kemia Wendel, NP 08/04/2024 "

## 2024-08-04 NOTE — Patient Instructions (Signed)
 Medication Changes:  INCREASE Furosemide  to 40mg  daily  START Amiodarone  200mg  twice daily  Lab Work:  Go over to the [facility]. Go pass the gift shop and have your blood work completed.  We will only call you if the results are abnormal or if the provider would like to make medication changes.  No news is good news.    Follow-Up in: Please follow up with the Advanced Heart Failure Clinic in 1 month with Ellouise Class, FNP.   Thank you for choosing [facility] Advanced Heart Failure Clinic.    At the Advanced Heart Failure Clinic, you and your health needs are our priority. We have a designated team specialized in the treatment of Heart Failure. This Care Team includes your primary Heart Failure Specialized Cardiologist (physician), Advanced Practice Providers (APPs- Physician Assistants and Nurse Practitioners), and Pharmacist who all work together to provide you with the care you need, when you need it.   You may see any of the following providers on your designated Care Team at your next follow up:  Dr. Toribio Fuel Dr. Ezra Shuck Dr. Ria Commander Dr. Morene Brownie Ellouise Class, FNP Jaun Bash, RPH-CPP  Please be sure to bring in all your medications bottles to every appointment.   Need to Contact Us :  If you have any questions or concerns before your next appointment please send us  a message through [place] or call our office at [phone].    TO LEAVE A MESSAGE FOR THE NURSE SELECT OPTION 2, PLEASE LEAVE A MESSAGE INCLUDING: YOUR NAME DATE OF BIRTH CALL BACK NUMBER REASON FOR CALL**this is important as we prioritize the call backs  YOU WILL RECEIVE A CALL BACK THE SAME DAY AS LONG AS YOU CALL BEFORE 4:00 PM

## 2024-08-05 ENCOUNTER — Ambulatory Visit: Admission: RE | Admit: 2024-08-05 | Admitting: Ophthalmology

## 2024-08-05 ENCOUNTER — Encounter: Admission: RE | Payer: Self-pay

## 2024-08-05 SURGERY — PHACOEMULSIFICATION, CATARACT, WITH IOL INSERTION
Anesthesia: Topical | Laterality: Right

## 2024-08-13 ENCOUNTER — Telehealth: Payer: Self-pay | Admitting: Family

## 2024-08-13 NOTE — Telephone Encounter (Signed)
 Called to confirm/remind patient of their appointment at the Advanced Heart Failure Clinic on 08/15/24.   Appointment:   [] Confirmed  [x] Left mess   [] No answer/No voice mail  [] VM Full/unable to leave message  [] Phone not in service  Patient reminded to bring all medications and/or complete list.  Confirmed patient has transportation. Gave directions, instructed to utilize valet parking.

## 2024-08-14 NOTE — Progress Notes (Unsigned)
 "   Advanced Heart Failure Clinic Note   PCP: Britta King, MD Cardiologist: Lonni Hanson, MD   Chief Complaint:   HPI: Matthew [name] is a 74 y.o. male with a history of chronic HFrEF, CVA. Afib, dialated cardiomyopathy, HTN, asthma, COPD, OSA, CKD stage 4, dementia, bipolar disorder, depression, hypothyroidism, OA, gout, alcohol  abuse, and obesity.   Echo 3/20 EF >65% and [facility] 3/20 Normal LV function and arteries.   Echo 10/22 EF 50-55%, LV function low-nornal, RV normal, and MR.   Echo 3/23 EF 35-40% LV function moderately decreased and dialated,, RV normal, mild MR, and atrial pressure of 8. TEE 3/23 EF 35-40% LV function moderate to severely decreased, RV low-normal and mildly enlarged, mild MR and TR. Cardioversion 3/23.   Admitted 10/25 following appointment at eye surgery center where he was to be in Afib with RVR. Given IV diltiazem  via EMS and started on diltiazem  in ED. Echo showed EF 35-40%, LV function moderately decreased and RV normal. CT head with mild chronic small vessel ischemic disease. CT Neck showed no occlusions. Discharged to SNF on coreg  6.25mg  BID and eliquis  continued.   Seen in [facility] 10/25, had been short of breath with some weakness, remained in AF. Seen by Dr. Hanson 07/02/24 had been making some improvements with DOE in AF. Unfortunately he was admitted 12/25 from SNF with AF RVR, on arrival to ED, he was in rate controlled AF. He was discharged back to SNF.   Seen in [facility] 08/04/24 volume up and lasix  was increased to 40mg  daily. Amiodarone  200mg  BID started for rate control.   He returns today for HF follow up by wheelchair with a chief complaint of   Past Medical History:  Diagnosis Date   Alcohol  abuse, in remission    Anemia    Anxiety    Arrhythmia    atrial fibrillation   Arthritis    Asthma    Atrial fibrillation (HCC)    Bipolar disorder (HCC)    CAD (coronary artery disease)    Cardiac LV ejection fraction 30-35%    CHF (congestive heart  failure) (HCC)    Chronic kidney disease, stage 4 (severe) (HCC)    Chronic pain    Chronic systolic (congestive) heart failure (HCC)    COPD (chronic obstructive pulmonary disease) (HCC)    Depression    Dysrhythmia    Afib   Edema    Epilepsy, unspecified, not intractable, without status epilepticus (HCC)    Global hypokinesis of left ventricle    Gout    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     albuterol  (VENTOLIN  HFA) 108 (90 Base) MCG/ACT inhaler INHALE 1 PUFF BY MOUTH INTO LUNGS DAILY 18 g 3   amiodarone  (PACERONE ) 200 MG tablet Take 1 tablet (200 mg total) by mouth 2 (two) times daily. 180 tablet 1   apixaban  (ELIQUIS ) 5 MG TABS tablet Take 1 tablet (5  mg total) by mouth 2 (two) times daily. 60 tablet 2   bisacodyl  (DULCOLAX) 5 MG EC tablet Take 1 tablet (5 mg total) by mouth daily as needed for moderate constipation. 30 tablet 3   busPIRone  (BUSPAR ) 15 MG tablet Take 15 mg by mouth 2 (two) times daily.     calcitRIOL  (ROCALTROL ) 0.25 MCG capsule Take 0.25 mcg by mouth daily.     dapagliflozin  propanediol (FARXIGA ) 10 MG TABS tablet Take 1 tablet (10 mg total) by mouth daily.     diclofenac Sodium (VOLTAREN) 1 % GEL Apply 1 Application topically 3 (three) times daily.     ferrous sulfate  325 (65 FE) MG EC tablet Take 325 mg by mouth every Monday, Wednesday, and Friday.     fluticasone -salmeterol (WIXELA INHUB) 500-50 MCG/ACT AEPB Inhale 1 puff into the lungs in the morning and  at bedtime. (Patient not taking: Reported on 08/04/2024)     furosemide  (LASIX ) 20 MG tablet Take 2 tablets (40 mg total) by mouth daily. 60 tablet 3   ipratropium-albuterol  (DUONEB) 0.5-2.5 (3) MG/3ML SOLN USE 1 VIAL VIA NEBULIZER EVERY 6 HOURS AS NEEDED 3 mL 6   lamoTRIgine  (LAMICTAL ) 25 MG tablet Take 50 mg by mouth daily.     latanoprost  (XALATAN ) 0.005 % ophthalmic solution Place 1 drop into both eyes at bedtime.     levothyroxine  (SYNTHROID ) 25 MCG tablet Take 25 mcg by mouth daily.     Lidocaine  HCl 4 % PTCH Apply topically. Apply 1 patch to left knee topically one time a day for osteoarthritis pain     metoprolol  succinate (TOPROL -XL) 50 MG 24 hr tablet Take 1 tablet (50 mg total) by mouth daily. Take with or immediately following a meal.     nitroGLYCERIN  (NITROSTAT ) 0.4 MG SL tablet Place 0.4 mg under the tongue every 5 (five) minutes as needed for chest pain.     sertraline  (ZOLOFT ) 100 MG tablet Take 100 mg by mouth daily. (Take with 25mg  tablet)     sertraline  (ZOLOFT ) 25 MG tablet Take 25 mg by mouth daily. (Take with 100mg  tablet)     traMADol  (ULTRAM ) 50 MG tablet Take 1 tablet (50 mg total) by mouth every 12 (twelve) hours as needed. SNF use only.  Refills per SNF MD 2 tablet 0   traZODone  (DESYREL ) 50 MG tablet Take 1.5 tablets by mouth at bedtime.     No current facility-administered medications for this visit.    Allergies  Allergen Reactions   Demerol [Meperidine Hcl]    Lisinopril  Other (See Comments)    Hypotensive    Meperidine And Related    Talwin [Pentazocine]       Social History   Socioeconomic History   Marital status: Widowed    Spouse name: Not on file   Number of children: 1   Years of education: Not on file   Highest education level: Not on file  Occupational History   Not on file  Tobacco Use   Smoking status: Never   Smokeless tobacco: Never  Vaping Use   Vaping status: Never Used  Substance and Sexual Activity   Alcohol  use: Not  Currently    Alcohol /week: 105.0 standard drinks of alcohol     Types: 105 Cans of beer per week    Comment: Drinks 15 beers a night in the bar.  Says he doesnt drink at home but goes to the bar everynight.   Drug use: Not Currently   Sexual activity: Not on file  Other Topics Concern   Not on file  Social History Narrative   Not on file   Social Drivers of Health   Tobacco Use: Low Risk (08/04/2024)   Patient History    Smoking Tobacco Use: Never    Smokeless Tobacco Use: Never    Passive Exposure: Not on file  Financial Resource Strain: Not on file  Food Insecurity: No Food Insecurity (07/16/2024)   Epic    Worried About Programme Researcher, Broadcasting/film/video in the Last Year: Never true    Ran Out of Food in the Last Year: Never true  Transportation Needs: No Transportation Needs (07/16/2024)   Epic    Lack of Transportation (Medical): No    Lack of Transportation (Non-Medical): No  Physical Activity: Not on file  Stress: Not on file  Social Connections: Moderately Integrated (07/16/2024)   Social Connection and Isolation Panel    Frequency of Communication with Friends and Family: Twice a week    Frequency of Social Gatherings with Friends and Family: Once a week    Attends Religious Services: More than 4 times per year    Active Member of [employer] or Organizations: Yes    Attends Banker Meetings: 1 to 4 times per year    Marital Status: Divorced  Intimate Partner Violence: Not At Risk (07/16/2024)   Epic    Fear of Current or Ex-Partner: No    Emotionally Abused: No    Physically Abused: No    Sexually Abused: No  Depression (PHQ2-9): Not on file  Alcohol  Screen: Not on file  Housing: High Risk (07/16/2024)   Epic    Unable to Pay for Housing in the Last Year: Yes    Number of Times Moved in the Last Year: 0    Homeless in the Last Year: No  Utilities: Not At Risk (07/16/2024)   Epic    Threatened with loss of utilities: No  Health Literacy: Not on file      Family  History  Problem Relation Age of Onset   COPD Mother    Cancer Mother    Melanoma Father      PHYSICAL EXAM: General: Haggard appearing. No distress  Cardiac: JVP difficult to assess. No murmurs  Resp: Lung sounds diminished throughout Extremities: Warm and dry.  Trace BLE edema.  Neuro: A&O x2. Affect pleasant. Very forgetful, information inaccurate   ECG 08/04/24: AF RVR 129 bpm   ASSESSMENT & PLAN:  Chronic HFpEF, now HFrEF, Dilated CM - Suspect tachy-mediated CM, although poor options for rhythm control with noncompliance - Echo 3/20 EF >65% and [facility] 3/20 Normal LV function and arteries.  - Drop in EF 3/25: 35-40%, dilated LV, RV ok. Stable, unchanged on echo 10/25. - ?AF contributing to drop in EF - NYHA class IIIb. Volume up on exam.  - GDMT limited by CKD IV. - continue lasix  40mg  daily  - continue farxiga  10mg  daily, may need to stop in the future d/t hygiene   2. Persistent Afib  - s/p DCCV 3/23. - Now back in AF. Dr. Mady considering repeat DCCV, patient reports noncompliance, however is in SNF where meds are managed by staff. Defer need for DCCV to Dr. Mady, as patient has significant limited functional capacity with advanced dementia - uncontrolled rate, patient unaware, ?contributing to dyspnea  - decrease amiodarone  to 200 mg daily  - TSH, free T4, CMP today - continue eliquis  5 mg BID - continue toprol  XL 50 mg daily  3. CKD  stage 4  - saw neprhology Geoffry) 06/25 - BMET 08/04/24 reviewed: sodium 142, potassium 4.9, creatinine 1.78, GFR 40 - BMET today   4. Hypothyroidism  - continue synthroid   - TSH 05/27/24 was 1.377 - TSH, free T4, CMP today as patient now on amiodarone   5. Dementia  - limited HPI due to dementia     Ellouise DELENA Class, FNP 08/14/2024 "

## 2024-08-15 ENCOUNTER — Ambulatory Visit: Attending: Family | Admitting: Family

## 2024-08-15 ENCOUNTER — Encounter: Payer: Self-pay | Admitting: Family

## 2024-08-15 VITALS — BP 92/60 | HR 79 | Wt 242.2 lb

## 2024-08-15 DIAGNOSIS — Z79899 Other long term (current) drug therapy: Secondary | ICD-10-CM | POA: Diagnosis not present

## 2024-08-15 DIAGNOSIS — I420 Dilated cardiomyopathy: Secondary | ICD-10-CM | POA: Diagnosis not present

## 2024-08-15 DIAGNOSIS — Z79890 Hormone replacement therapy (postmenopausal): Secondary | ICD-10-CM | POA: Insufficient documentation

## 2024-08-15 DIAGNOSIS — G4733 Obstructive sleep apnea (adult) (pediatric): Secondary | ICD-10-CM | POA: Diagnosis not present

## 2024-08-15 DIAGNOSIS — F319 Bipolar disorder, unspecified: Secondary | ICD-10-CM | POA: Diagnosis not present

## 2024-08-15 DIAGNOSIS — I4891 Unspecified atrial fibrillation: Secondary | ICD-10-CM

## 2024-08-15 DIAGNOSIS — E669 Obesity, unspecified: Secondary | ICD-10-CM | POA: Diagnosis not present

## 2024-08-15 DIAGNOSIS — R0602 Shortness of breath: Secondary | ICD-10-CM | POA: Diagnosis present

## 2024-08-15 DIAGNOSIS — F1011 Alcohol abuse, in remission: Secondary | ICD-10-CM | POA: Insufficient documentation

## 2024-08-15 DIAGNOSIS — N184 Chronic kidney disease, stage 4 (severe): Secondary | ICD-10-CM | POA: Diagnosis not present

## 2024-08-15 DIAGNOSIS — M109 Gout, unspecified: Secondary | ICD-10-CM | POA: Diagnosis not present

## 2024-08-15 DIAGNOSIS — I4819 Other persistent atrial fibrillation: Secondary | ICD-10-CM | POA: Insufficient documentation

## 2024-08-15 DIAGNOSIS — Z8673 Personal history of transient ischemic attack (TIA), and cerebral infarction without residual deficits: Secondary | ICD-10-CM | POA: Diagnosis not present

## 2024-08-15 DIAGNOSIS — F0390 Unspecified dementia without behavioral disturbance: Secondary | ICD-10-CM | POA: Insufficient documentation

## 2024-08-15 DIAGNOSIS — Z7984 Long term (current) use of oral hypoglycemic drugs: Secondary | ICD-10-CM | POA: Insufficient documentation

## 2024-08-15 DIAGNOSIS — J4489 Other specified chronic obstructive pulmonary disease: Secondary | ICD-10-CM | POA: Insufficient documentation

## 2024-08-15 DIAGNOSIS — Z7901 Long term (current) use of anticoagulants: Secondary | ICD-10-CM | POA: Insufficient documentation

## 2024-08-15 DIAGNOSIS — M1990 Unspecified osteoarthritis, unspecified site: Secondary | ICD-10-CM | POA: Diagnosis not present

## 2024-08-15 DIAGNOSIS — I130 Hypertensive heart and chronic kidney disease with heart failure and stage 1 through stage 4 chronic kidney disease, or unspecified chronic kidney disease: Secondary | ICD-10-CM | POA: Insufficient documentation

## 2024-08-15 DIAGNOSIS — E039 Hypothyroidism, unspecified: Secondary | ICD-10-CM | POA: Insufficient documentation

## 2024-08-15 DIAGNOSIS — F03C0 Unspecified dementia, severe, without behavioral disturbance, psychotic disturbance, mood disturbance, and anxiety: Secondary | ICD-10-CM

## 2024-08-15 DIAGNOSIS — I5022 Chronic systolic (congestive) heart failure: Secondary | ICD-10-CM | POA: Insufficient documentation

## 2024-08-15 MED ORDER — FUROSEMIDE 20 MG PO TABS: 20.0000 mg | ORAL_TABLET | Freq: Every day | ORAL | 1 refills | Status: AC

## 2024-08-15 NOTE — Patient Instructions (Signed)
 Medication Changes:  START Amiodarone  200mg  daily  CONTINUE Furosemide  20mg  daily   Follow-Up in: Please keep follow up appointment with Ellouise Class, FNP.   Thank you for choosing [facility] Advanced Heart Failure Clinic.    At the Advanced Heart Failure Clinic, you and your health needs are our priority. We have a designated team specialized in the treatment of Heart Failure. This Care Team includes your primary Heart Failure Specialized Cardiologist (physician), Advanced Practice Providers (APPs- Physician Assistants and Nurse Practitioners), and Pharmacist who all work together to provide you with the care you need, when you need it.   You may see any of the following providers on your designated Care Team at your next follow up:  Dr. Toribio Fuel Dr. Ezra Shuck Dr. Ria Commander Dr. Morene Brownie Ellouise Class, FNP Jaun Bash, RPH-CPP  Please be sure to bring in all your medications bottles to every appointment.   Need to Contact Us :  If you have any questions or concerns before your next appointment please send us  a message through [place] or call our office at [phone].    TO LEAVE A MESSAGE FOR THE NURSE SELECT OPTION 2, PLEASE LEAVE A MESSAGE INCLUDING: YOUR NAME DATE OF BIRTH CALL BACK NUMBER REASON FOR CALL**this is important as we prioritize the call backs  YOU WILL RECEIVE A CALL BACK THE SAME DAY AS LONG AS YOU CALL BEFORE 4:00 PM

## 2024-09-04 NOTE — Progress Notes (Unsigned)
 "   Advanced Heart Failure Clinic Note   PCP: Britta King, MD Cardiologist: Lonni Hanson, MD   Chief Complaint: shortness of breath   HPI: Matthew [name] is a 74 y.o. male with a history of chronic HFrEF, CVA. Afib, dialated cardiomyopathy, HTN, asthma, COPD, OSA, CKD stage 4, dementia, bipolar disorder, depression, hypothyroidism, OA, gout, alcohol  abuse, and obesity.   Echo 3/20 EF >65% and [facility] 3/20 Normal LV function and arteries.   Echo 10/22 EF 50-55%, LV function low-nornal, RV normal, and MR.   Echo 3/23 EF 35-40% LV function moderately decreased and dialated,, RV normal, mild MR, and atrial pressure of 8. TEE 3/23 EF 35-40% LV function moderate to severely decreased, RV low-normal and mildly enlarged, mild MR and TR. Cardioversion 3/23.   Admitted 10/25 following appointment at eye surgery center where he was to be in Afib with RVR. Given IV diltiazem  via EMS and started on diltiazem  in ED. Echo showed EF 35-40%, LV function moderately decreased and RV normal. CT head with mild chronic small vessel ischemic disease. CT Neck showed no occlusions. Discharged to SNF on coreg  6.25mg  BID and eliquis  continued.   Seen in [facility] 10/25, had been short of breath with some weakness, remained in AF. Seen by Dr. Hanson 07/02/24 had been making some improvements with DOE in AF. Unfortunately he was admitted 12/25 from SNF with AF RVR, on arrival to ED, he was in rate controlled AF. He was discharged back to SNF.   Seen in [facility] 08/04/24 volume up and lasix  was increased to 40mg  daily. Amiodarone  200mg  BID started for rate control.   Seen in [facility] 08/15/24 where lasix  increase nor initiation of amiodarone  were done.   He returns today for HF follow up in wheelchair with a chief complaint of shortness of breath. Has associated fatigue, occasional palpitations, light-headedness, sleeping ok. History is limited due to patient's dementia. He says that he doesn't take any medications, in fact, he takes  numerous medications.   ROS: All systems negative except what is listed in HPI, PMH and Problem List   Past Medical History:  Diagnosis Date   Alcohol  abuse, in remission    Anemia    Anxiety    Arrhythmia    atrial fibrillation   Arthritis    Asthma    Atrial fibrillation (HCC)    Bipolar disorder (HCC)    CAD (coronary artery disease)    Cardiac LV ejection fraction 30-35%    CHF (congestive heart failure) (HCC)    Chronic kidney disease, stage 4 (severe) (HCC)    Chronic pain    Chronic systolic (congestive) heart failure (HCC)    COPD (chronic obstructive pulmonary disease) (HCC)    Depression    Dysrhythmia    Afib   Edema    Epilepsy, unspecified, not intractable, without status epilepticus (HCC)    Global hypokinesis of left ventricle    Gout    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     albuterol  (VENTOLIN  HFA) 108 (90 Base) MCG/ACT inhaler INHALE 1 PUFF BY MOUTH INTO LUNGS DAILY 18 g 3   amiodarone  (PACERONE ) 200 MG tablet Take 1 tablet (200 mg total) by mouth 2 (two) times daily. (Patient not taking: Reported on 08/15/2024) 180 tablet 1   apixaban  (ELIQUIS ) 5 MG TABS tablet Take 1 tablet (5 mg total) by mouth 2 (two) times daily. 60 tablet 2   bisacodyl  (DULCOLAX) 5 MG EC tablet Take 1 tablet (5 mg total) by mouth daily as needed for moderate constipation. 30 tablet 3   busPIRone  (BUSPAR ) 15 MG tablet Take 15 mg by mouth 2 (two) times daily.     calcitRIOL   (ROCALTROL ) 0.25 MCG capsule Take 0.25 mcg by mouth daily.     dapagliflozin  propanediol (FARXIGA ) 10 MG TABS tablet Take 1 tablet (10 mg total) by mouth daily.     diclofenac Sodium (VOLTAREN) 1 % GEL Apply 1 Application topically 3 (three) times daily.     ferrous sulfate  325 (65 FE) MG EC tablet Take 325 mg by mouth every Monday, Wednesday, and Friday.     fluticasone -salmeterol (WIXELA INHUB) 500-50 MCG/ACT AEPB Inhale 1 puff into the lungs in the morning and at bedtime.     furosemide  (LASIX ) 20 MG tablet Take 1 tablet (20 mg total) by mouth daily. 90 tablet 1   ipratropium-albuterol  (DUONEB) 0.5-2.5 (3) MG/3ML SOLN USE 1 VIAL VIA NEBULIZER EVERY 6 HOURS AS NEEDED 3 mL 6   lamoTRIgine  (LAMICTAL ) 25 MG tablet Take 50 mg by mouth daily.     latanoprost  (XALATAN ) 0.005 % ophthalmic solution Place 1 drop into both eyes at bedtime.     levothyroxine  (SYNTHROID ) 25 MCG tablet Take 25 mcg by mouth daily.     Lidocaine  HCl 4 % PTCH Apply topically. Apply 1 patch to left knee topically one time a day for osteoarthritis pain     metoprolol  succinate (TOPROL -XL) 50 MG 24 hr tablet Take 1 tablet (50 mg total) by mouth daily. Take with or immediately following a meal.     nitroGLYCERIN  (NITROSTAT ) 0.4 MG SL tablet Place 0.4 mg under the tongue every 5 (five) minutes as needed for chest pain.     sertraline  (ZOLOFT ) 100 MG tablet Take 100 mg by mouth daily. (Take with 25mg  tablet)     sertraline  (ZOLOFT ) 25 MG tablet Take 25 mg by mouth daily. (Take with 100mg  tablet)     traMADol  (ULTRAM ) 50 MG tablet Take 1 tablet (50 mg total) by mouth every 12 (twelve) hours as needed. SNF use only.  Refills per SNF MD 2 tablet 0   traZODone  (DESYREL ) 50 MG tablet Take 1.5 tablets by mouth at bedtime.     No current facility-administered medications for this visit.    Allergies  Allergen Reactions   Demerol [Meperidine Hcl]    Lisinopril  Other (See Comments)    Hypotensive    Meperidine And Related    Talwin  [Pentazocine]       Social History   Socioeconomic History   Marital status: Widowed    Spouse name: Not on file   Number of children: 1   Years of education: Not on file   Highest education level: Not on file  Occupational History   Not on file  Tobacco Use   Smoking status: Never   Smokeless tobacco: Never  Vaping Use   Vaping status: Never Used  Substance and  Sexual Activity   Alcohol  use: Not Currently    Alcohol /week: 105.0 standard drinks of alcohol     Types: 105 Cans of beer per week    Comment: Drinks 15 beers a night in the bar.  Says he doesnt drink at home but goes to the bar everynight.   Drug use: Not Currently   Sexual activity: Not on file  Other Topics Concern   Not on file  Social History Narrative   Not on file   Social Drivers of Health   Tobacco Use: Low Risk (08/15/2024)   Patient History    Smoking Tobacco Use: Never    Smokeless Tobacco Use: Never    Passive Exposure: Not on file  Financial Resource Strain: Not on file  Food Insecurity: No Food Insecurity (07/16/2024)   Epic    Worried About Programme Researcher, Broadcasting/film/video in the Last Year: Never true    Ran Out of Food in the Last Year: Never true  Transportation Needs: No Transportation Needs (07/16/2024)   Epic    Lack of Transportation (Medical): No    Lack of Transportation (Non-Medical): No  Physical Activity: Not on file  Stress: Not on file  Social Connections: Moderately Integrated (07/16/2024)   Social Connection and Isolation Panel    Frequency of Communication with Friends and Family: Twice a week    Frequency of Social Gatherings with Friends and Family: Once a week    Attends Religious Services: More than 4 times per year    Active Member of [employer] or Organizations: Yes    Attends Banker Meetings: 1 to 4 times per year    Marital Status: Divorced  Intimate Partner Violence: Not At Risk (07/16/2024)   Epic    Fear of Current or Ex-Partner: No    Emotionally Abused: No     Physically Abused: No    Sexually Abused: No  Depression (PHQ2-9): Not on file  Alcohol  Screen: Not on file  Housing: High Risk (07/16/2024)   Epic    Unable to Pay for Housing in the Last Year: Yes    Number of Times Moved in the Last Year: 0    Homeless in the Last Year: No  Utilities: Not At Risk (07/16/2024)   Epic    Threatened with loss of utilities: No  Health Literacy: Not on file      Family History  Problem Relation Age of Onset   COPD Mother    Cancer Mother    Melanoma Father    Vitals:   09/05/24 1000  BP: 92/63  Pulse: (!) 123  SpO2: 96%  Weight: 241 lb 9.6 oz (109.6 kg)   Wt Readings from Last 3 Encounters:  09/05/24 241 lb 6.5 oz (109.5 kg)  09/05/24 241 lb 9.6 oz (109.6 kg)  08/15/24 242 lb 3.2 oz (109.9 kg)   Lab Results  Component Value Date   CREATININE 1.78 (H) 08/04/2024   CREATININE 2.22 (H) 07/20/2024   CREATININE 1.99 (H) 07/19/2024    PHYSICAL EXAM:  General: Disheveled appearing in wheelchair. Cor: No JVD. Irregular rhythm, tachycardic.  Lungs: clear Abdomen: soft, nontender, nondistended. Extremities: no edema Neuro:. Affect pleasant. Forgetful   ECG today: AF RVR, HR 123 (personally reviewed)    ASSESSMENT & PLAN:  Chronic HFpEF, now HFrEF, Dilated CM - Suspect tachy-mediated CM, at [facility] so med compliance should be good - Echo 3/20 EF >65% and [facility] 3/20 Normal LV function and arteries.  - Drop in EF  3/25: 35-40%, dilated LV, RV ok. Stable, unchanged on echo 10/25. - ?AF contributing to drop in EF - NYHA class II  - euvolemic today - weight stable from last visit here 3 weeks ago - GDMT limited by CKD IV. - continue farxiga  10mg  daily, may need to stop in the future d/t hygiene - continue lasix  20mg  daily.  - continue toprol  50mg  daily  2. AF RVR  - s/p DCCV 3/23. - EKG today is AF RVR - uncontrolled rate, patient unaware, ?contributing to dyspnea  - amiodarone  200mg  BID started at last visit 3 weeks  ago. Needs more rate control - continue eliquis  5 mg BID. Should not have had any missed doses since in SNF. May need another DCCV.  - continue toprol  XL 50 mg daily  3. CKD stage 4  - saw neprhology Geoffry) 06/25 - BMET 08/04/24 reviewed: sodium 142, potassium 4.9, creatinine 1.78, GFR 40  4. Hypothyroidism  - continue synthroid   - TSH 05/27/24 was 1.377  5. Dementia  - limited HPI due to advanced dementia  - significant limited functional capacity   Due to continued AF RVR even after 3 weeks of amiodarone , will send to ER for further rate control / possible DCCV. WENDI Gosling, RN took patient over to the ER for a hand-off and called [facility] facility to make them aware.    Return here 1 month, sooner if needed.   I spent 41 minutes reviewing records, interviewing/ examing patient and managing plan/ orders.   Ellouise DELENA Class, FNP 09/04/24 "

## 2024-09-05 ENCOUNTER — Ambulatory Visit: Attending: Family | Admitting: Family

## 2024-09-05 ENCOUNTER — Emergency Department: Admission: EM | Admit: 2024-09-05 | Discharge: 2024-09-05 | Disposition: A | Discharge status: Skilled Nursing Facility

## 2024-09-05 ENCOUNTER — Other Ambulatory Visit: Payer: Self-pay

## 2024-09-05 ENCOUNTER — Emergency Department

## 2024-09-05 ENCOUNTER — Encounter: Payer: Self-pay | Admitting: Family

## 2024-09-05 VITALS — BP 92/63 | HR 123 | Wt 241.6 lb

## 2024-09-05 DIAGNOSIS — J449 Chronic obstructive pulmonary disease, unspecified: Secondary | ICD-10-CM | POA: Diagnosis not present

## 2024-09-05 DIAGNOSIS — I5022 Chronic systolic (congestive) heart failure: Secondary | ICD-10-CM | POA: Insufficient documentation

## 2024-09-05 DIAGNOSIS — F03C0 Unspecified dementia, severe, without behavioral disturbance, psychotic disturbance, mood disturbance, and anxiety: Secondary | ICD-10-CM

## 2024-09-05 DIAGNOSIS — F1010 Alcohol abuse, uncomplicated: Secondary | ICD-10-CM | POA: Diagnosis not present

## 2024-09-05 DIAGNOSIS — Z79899 Other long term (current) drug therapy: Secondary | ICD-10-CM | POA: Insufficient documentation

## 2024-09-05 DIAGNOSIS — I2510 Atherosclerotic heart disease of native coronary artery without angina pectoris: Secondary | ICD-10-CM | POA: Insufficient documentation

## 2024-09-05 DIAGNOSIS — I4819 Other persistent atrial fibrillation: Secondary | ICD-10-CM | POA: Insufficient documentation

## 2024-09-05 DIAGNOSIS — N184 Chronic kidney disease, stage 4 (severe): Secondary | ICD-10-CM | POA: Insufficient documentation

## 2024-09-05 DIAGNOSIS — R002 Palpitations: Secondary | ICD-10-CM

## 2024-09-05 DIAGNOSIS — I4891 Unspecified atrial fibrillation: Secondary | ICD-10-CM | POA: Diagnosis present

## 2024-09-05 DIAGNOSIS — F319 Bipolar disorder, unspecified: Secondary | ICD-10-CM | POA: Insufficient documentation

## 2024-09-05 DIAGNOSIS — M1990 Unspecified osteoarthritis, unspecified site: Secondary | ICD-10-CM | POA: Diagnosis not present

## 2024-09-05 DIAGNOSIS — Z79890 Hormone replacement therapy (postmenopausal): Secondary | ICD-10-CM | POA: Diagnosis not present

## 2024-09-05 DIAGNOSIS — Z7984 Long term (current) use of oral hypoglycemic drugs: Secondary | ICD-10-CM | POA: Diagnosis not present

## 2024-09-05 DIAGNOSIS — Z7901 Long term (current) use of anticoagulants: Secondary | ICD-10-CM | POA: Insufficient documentation

## 2024-09-05 DIAGNOSIS — E669 Obesity, unspecified: Secondary | ICD-10-CM | POA: Insufficient documentation

## 2024-09-05 DIAGNOSIS — G4733 Obstructive sleep apnea (adult) (pediatric): Secondary | ICD-10-CM | POA: Insufficient documentation

## 2024-09-05 DIAGNOSIS — M109 Gout, unspecified: Secondary | ICD-10-CM | POA: Insufficient documentation

## 2024-09-05 DIAGNOSIS — I130 Hypertensive heart and chronic kidney disease with heart failure and stage 1 through stage 4 chronic kidney disease, or unspecified chronic kidney disease: Secondary | ICD-10-CM | POA: Insufficient documentation

## 2024-09-05 DIAGNOSIS — J4489 Other specified chronic obstructive pulmonary disease: Secondary | ICD-10-CM | POA: Diagnosis not present

## 2024-09-05 DIAGNOSIS — Z8673 Personal history of transient ischemic attack (TIA), and cerebral infarction without residual deficits: Secondary | ICD-10-CM | POA: Insufficient documentation

## 2024-09-05 DIAGNOSIS — F0390 Unspecified dementia without behavioral disturbance: Secondary | ICD-10-CM | POA: Diagnosis not present

## 2024-09-05 DIAGNOSIS — E039 Hypothyroidism, unspecified: Secondary | ICD-10-CM | POA: Insufficient documentation

## 2024-09-05 DIAGNOSIS — Z96643 Presence of artificial hip joint, bilateral: Secondary | ICD-10-CM | POA: Insufficient documentation

## 2024-09-05 DIAGNOSIS — I420 Dilated cardiomyopathy: Secondary | ICD-10-CM | POA: Diagnosis present

## 2024-09-05 DIAGNOSIS — M810 Age-related osteoporosis without current pathological fracture: Secondary | ICD-10-CM | POA: Diagnosis not present

## 2024-09-05 DIAGNOSIS — R944 Abnormal results of kidney function studies: Secondary | ICD-10-CM | POA: Insufficient documentation

## 2024-09-05 LAB — TSH: TSH: 5.91 u[IU]/mL — ABNORMAL HIGH (ref 0.350–4.500)

## 2024-09-05 LAB — URINALYSIS, ROUTINE W REFLEX MICROSCOPIC
Bilirubin Urine: NEGATIVE
Glucose, UA: 150 mg/dL — AB
Hgb urine dipstick: NEGATIVE
Ketones, ur: NEGATIVE mg/dL
Nitrite: NEGATIVE
Protein, ur: NEGATIVE mg/dL
Specific Gravity, Urine: 1.016 (ref 1.005–1.030)
pH: 5 (ref 5.0–8.0)

## 2024-09-05 LAB — CBC WITH DIFFERENTIAL/PLATELET
Abs Immature Granulocytes: 0.02 K/uL (ref 0.00–0.07)
Basophils Absolute: 0 K/uL (ref 0.0–0.1)
Basophils Relative: 1 %
Eosinophils Absolute: 0.4 K/uL (ref 0.0–0.5)
Eosinophils Relative: 7 %
HCT: 41.9 % (ref 39.0–52.0)
Hemoglobin: 12.8 g/dL — ABNORMAL LOW (ref 13.0–17.0)
Immature Granulocytes: 0 %
Lymphocytes Relative: 21 %
Lymphs Abs: 1.2 K/uL (ref 0.7–4.0)
MCH: 31.1 pg (ref 26.0–34.0)
MCHC: 30.5 g/dL (ref 30.0–36.0)
MCV: 101.7 fL — ABNORMAL HIGH (ref 80.0–100.0)
Monocytes Absolute: 0.7 K/uL (ref 0.1–1.0)
Monocytes Relative: 13 %
Neutro Abs: 3.2 K/uL (ref 1.7–7.7)
Neutrophils Relative %: 58 %
Platelets: 218 K/uL (ref 150–400)
RBC: 4.12 MIL/uL — ABNORMAL LOW (ref 4.22–5.81)
RDW: 16.8 % — ABNORMAL HIGH (ref 11.5–15.5)
WBC: 5.6 K/uL (ref 4.0–10.5)
nRBC: 0 % (ref 0.0–0.2)

## 2024-09-05 LAB — TROPONIN T, HIGH SENSITIVITY
Troponin T High Sensitivity: 38 ng/L — ABNORMAL HIGH (ref 0–19)
Troponin T High Sensitivity: 36 ng/L — ABNORMAL HIGH (ref 0–19)

## 2024-09-05 LAB — COMPREHENSIVE METABOLIC PANEL WITH GFR
ALT: 6 U/L (ref 0–44)
AST: 15 U/L (ref 15–41)
Albumin: 3.5 g/dL (ref 3.5–5.0)
Alkaline Phosphatase: 121 U/L (ref 38–126)
Anion gap: 8 (ref 5–15)
BUN: 25 mg/dL — ABNORMAL HIGH (ref 8–23)
CO2: 20 mmol/L — ABNORMAL LOW (ref 22–32)
Calcium: 9.7 mg/dL (ref 8.9–10.3)
Chloride: 108 mmol/L (ref 98–111)
Creatinine, Ser: 2.21 mg/dL — ABNORMAL HIGH (ref 0.61–1.24)
GFR, Estimated: 30 mL/min — ABNORMAL LOW
Glucose, Bld: 96 mg/dL (ref 70–99)
Potassium: 4.5 mmol/L (ref 3.5–5.1)
Sodium: 136 mmol/L (ref 135–145)
Total Bilirubin: 0.4 mg/dL (ref 0.0–1.2)
Total Protein: 6.4 g/dL — ABNORMAL LOW (ref 6.5–8.1)

## 2024-09-05 LAB — RESP PANEL BY RT-PCR (RSV, FLU A&B, COVID)  RVPGX2
Influenza A by PCR: NEGATIVE
Influenza B by PCR: NEGATIVE
Resp Syncytial Virus by PCR: NEGATIVE
SARS Coronavirus 2 by RT PCR: NEGATIVE

## 2024-09-05 LAB — T4, FREE: Free T4: 0.91 ng/dL (ref 0.80–2.00)

## 2024-09-05 LAB — MAGNESIUM: Magnesium: 2.2 mg/dL (ref 1.7–2.4)

## 2024-09-05 LAB — PRO BRAIN NATRIURETIC PEPTIDE: Pro Brain Natriuretic Peptide: 1669 pg/mL — ABNORMAL HIGH

## 2024-09-05 MED ORDER — METOPROLOL SUCCINATE ER 50 MG PO TB24
50.0000 mg | ORAL_TABLET | Freq: Two times a day (BID) | ORAL | 2 refills | Status: AC
  Filled 2024-09-05: qty 60, 30d supply, fill #0

## 2024-09-05 MED ORDER — SODIUM CHLORIDE 0.9 % IV BOLUS
250.0000 mL | Freq: Once | INTRAVENOUS | Status: AC
  Administered 2024-09-05: 250 mL via INTRAVENOUS

## 2024-09-05 MED ORDER — METOPROLOL TARTRATE 5 MG/5ML IV SOLN
5.0000 mg | Freq: Once | INTRAVENOUS | Status: AC
  Administered 2024-09-05: 5 mg via INTRAVENOUS
  Filled 2024-09-05: qty 5

## 2024-09-05 MED ORDER — IPRATROPIUM-ALBUTEROL 0.5-2.5 (3) MG/3ML IN SOLN
3.0000 mL | Freq: Once | RESPIRATORY_TRACT | Status: AC
  Administered 2024-09-05: 3 mL via RESPIRATORY_TRACT
  Filled 2024-09-05: qty 3

## 2024-09-05 MED ORDER — AMIODARONE HCL 200 MG PO TABS
200.0000 mg | ORAL_TABLET | Freq: Every day | ORAL | 1 refills | Status: AC
  Filled 2024-09-05: qty 90, 90d supply, fill #0

## 2024-09-05 NOTE — ED Notes (Signed)
 [facility] CALLED SPOKE WITH [facility] REGARDING TRANSFER

## 2024-09-05 NOTE — Progress Notes (Signed)
 Called by ER physician to admit patient who presented from skilled nursing facility where he resides for evaluation of a rapid ventricular rate. Patient has a known history of A-fib Patient was seen and examined at the bedside.  Appears comfortable and in no distress. Per ER physician was tachycardic in the 140s and he received metoprolol  5 mg IV x 1 and is currently rate controlled with heart rate in the 80s and 90s. Patient complained of some shortness of breath and on physical exam had scattered wheezing.  He denied having any chest pain. Requested cardiology recommendation to determine the need for hospitalization since he is currently rate controlled and is already on chronic anticoagulation therapy. Discussed with ER physician and recommended to administer bronchodilator therapy as needed.

## 2024-09-05 NOTE — Discharge Instructions (Addendum)
 You were seen in our emergency department and evaluated by cardiology.  Per cardiology  Mr. Bachicha is okay for d/c today from my standpoint.  I recommend reducing his amiodarone  to 200 mg daily and increasing his metoprolol  succinate to 50 mg BID.  We'll have him f/u with us  in a week to reassess his a-fib.   I have sent new scripts to your pharmacy.  Please continue your other regular medications.  Return with any acutely worsening symptoms -- RETURN PRECAUTIONS & AFTERCARE: (ENGLISH) RETURN PRECAUTIONS: Return immediately to the emergency department or see/call your doctor if you feel worse, weak or have changes in speech or vision, are short of breath, have fever, vomiting, pain, bleeding or dark stool, trouble urinating or any new issues. Return here or see/call your doctor if not improving as expected for your suspected condition. FOLLOW-UP CARE: Call your doctor and/or any doctors we referred you to for more advice and to make an appointment. Do this today, tomorrow or after the weekend. Some doctors only take PPO insurance so if you have HMO insurance you may want to contact your HMO or your regular doctor for referral to a specialist within your plan. Either way tell the doctor's office that it was a referral from the emergency department so you get the soonest possible appointment.  YOUR TEST RESULTS: Take result reports of any blood or urine tests, imaging tests and EKG's to your doctor and any referral doctor. Have any abnormal tests repeated. Your doctor or a referral doctor can let you know when this should be done. Also make sure your doctor contacts this hospital to get any test results that are not currently available such as cultures or special tests for infection and final imaging reports, which are often not available at the time you leave the ER but which may list additional important findings that are not documented on the preliminary report. BLOOD PRESSURE: If your blood pressure was  greater than 120/80 have your blood pressure rechecked within 1 to 2 weeks. MEDICATION SIDE EFFECTS: Do not drive, walk, bike, take the bus, etc. if you have received or are being prescribed any sedating medications such as those for pain or anxiety or certain antihistamines like Benadryl . If you have been give one of these here get a taxi home or have a friend drive you home. Ask your pharmacist to counsel you on potential side effects of any new medication

## 2024-09-05 NOTE — ED Triage Notes (Addendum)
 Presents from PCP for C/O afib. Patient lives at [facility] and is AOx1 to baseline per report.. Patient is without complaint.  AAO x 3. Skin warm and dry. NAD
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
## 2024-09-05 NOTE — ED Provider Notes (Signed)
 "  [facility] Provider Note    Event Date/Time   First MD Initiated Contact with Patient 09/05/24 1057     (approximate)   History   Atrial Fibrillation   HPI  Matthew [name] is a 74 y.o. male  significant of dementia, persistent A-fib on Eliquis , heart failure with reduced ejection fraction who follows with [facility] cardiology who presents from [facility] healthcare for palpitations and atrial fibrillation with rapid ventricular rate.  Patient denies any chest pain but reports palpitations.  He does not know where he is currently and does not answer every question correctly.  We did call the facility and it was determined that patient did receive his morning medications today      Physical Exam   Triage Vital Signs: ED Triage Vitals [09/05/24 1049]  Encounter Vitals Group     BP 112/79     Girls Systolic BP Percentile      Girls Diastolic BP Percentile      Boys Systolic BP Percentile      Boys Diastolic BP Percentile      Pulse Rate (!) 124     Resp 16     Temp 97.7 F (36.5 C)     Temp Source Oral     SpO2 97 %     Weight 241 lb 6.5 oz (109.5 kg)     Height      Head Circumference      Peak Flow      Pain Score 0     Pain Loc      Pain Education      Exclude from Growth Chart     Most recent vital signs: Vitals:   09/05/24 1333 09/05/24 1430  BP: 132/81 (!) 130/90  Pulse: 90 79  Resp: 20 19  Temp: 98.1 F (36.7 C)   SpO2: 100% 98%    Nursing Triage Note reviewed. Vital signs reviewed and patients oxygen  saturation is normoxic  General: Patient is well nourished, well developed, awake and alert, resting comfortably in no acute distress Head: Normocephalic and atraumatic Eyes: Normal inspection, extraocular muscles intact, no conjunctival pallor Ear, nose, throat: Normal external exam Neck: Normal range of motion Respiratory: Patient is in no respiratory distress, lungs CTAB Cardiovascular: Patient is  tachycardic, irregularly  irregular GI: Abd SNT with no guarding or rebound  Extremities: pulses intact with good cap refills, no LE pitting edema or calf tenderness Neuro: The patient is alert and oriented to person, not to place or time does not answer every question correctly, no focal neurological deficits I have not  ED Results / Procedures / Treatments   Labs (all labs ordered are listed, but only abnormal results are displayed) Labs Reviewed  CBC WITH DIFFERENTIAL/PLATELET - Abnormal; Notable for the following components:      Result Value   RBC 4.12 (*)    Hemoglobin 12.8 (*)    MCV 101.7 (*)    RDW 16.8 (*)    All other components within normal limits  COMPREHENSIVE METABOLIC PANEL WITH GFR - Abnormal; Notable for the following components:   CO2 20 (*)    BUN 25 (*)    Creatinine, Ser 2.21 (*)    Total Protein 6.4 (*)    GFR, Estimated 30 (*)    All other components within normal limits  PRO BRAIN NATRIURETIC PEPTIDE - Abnormal; Notable for the following components:   Pro Brain Natriuretic Peptide 1,669.0 (*)    All other components within  normal limits  TSH - Abnormal; Notable for the following components:   TSH 5.910 (*)    All other components within normal limits  URINALYSIS, ROUTINE W REFLEX MICROSCOPIC - Abnormal; Notable for the following components:   Color, Urine YELLOW (*)    APPearance CLEAR (*)    Glucose, UA 150 (*)    Leukocytes,Ua SMALL (*)    Bacteria, UA RARE (*)    All other components within normal limits  TROPONIN T, HIGH SENSITIVITY - Abnormal; Notable for the following components:   Troponin T High Sensitivity 38 (*)    All other components within normal limits  TROPONIN T, HIGH SENSITIVITY - Abnormal; Notable for the following components:   Troponin T High Sensitivity 36 (*)    All other components within normal limits  RESP PANEL BY RT-PCR (RSV, FLU A&B, COVID)  RVPGX2  MAGNESIUM   T4, FREE     EKG EKG and rhythm strip are interpreted by myself:   EKG:  tachycardic rhythm] at heart rate of 124, normal QRS duration, QTc 505, nonspecific ST segments and T waves no ectopy EKG not consistent with Acute STEMI Rhythm strip: A-fib with rapid ventricular rate in lead II   RADIOLOGY Chest x-ray: No acute abnormality on my independent review interpretation radiologist agrees:  1. Mild right basilar linear atelectasis.  2. Stable cardiomegaly.  3. Stable soft tissue opacity along the mid supraclavicular region  on the right. Correlation with physical examination is recommended  to determine the presence of a palpable soft tissue mass.      PROCEDURES:  Critical Care performed: No  Procedures   MEDICATIONS ORDERED IN ED: Medications  sodium chloride  0.9 % bolus 250 mL (0 mLs Intravenous Stopped 09/05/24 1243)  metoprolol  tartrate (LOPRESSOR ) injection 5 mg (5 mg Intravenous Given 09/05/24 1330)  ipratropium-albuterol  (DUONEB) 0.5-2.5 (3) MG/3ML nebulizer solution 3 mL (3 mLs Nebulization Given 09/05/24 1437)     IMPRESSION / MDM / ASSESSMENT AND PLAN / ED COURSE                                Differential diagnosis includes, but is not limited to, arrhythmia, electrolyte derangement, pneumonia, URI, ACS  ED course: Patient presents to our facility with A-fib with RVR despite receiving his home medications.  He was given a small dose of IV fluid and 5 mg of IV metoprolol  and patient became rate controlled.  Troponin was mildly elevated in the indiscriminate zone however I think this is secondary to his A-fib.  He had no profound electrolyte derangements and his creatinine was at his baseline.  He had no anemia or leukocytosis and UA was not consistent with UTI.  Chest x-ray was unremarkable and COVID panel was unremarkable.  He was examined by both hospitalist and cardiologist Dr. Mady, and all feel comfortable with the patient returning home today with medication adjustments.  Patient will return to [place] house   Clinical Course as of  09/05/24 1540  Fri Sep 05, 2024  1315 Creatinine(!): 2.21 Slightly more than baseline [HD]  1403 Case discussed with the hospitalist, she recommends, touching base with cardiology first to determine whether he needs admission [HD]  1404 Touching base with [facility] [HD]  1409 Dr. Mady will see the patient [HD]  1438 Per cardiology: As we discussed, Matthew [name] is okay for d/c today from my standpoint.  I recommend reducing his amiodarone  to 200 mg daily and  increasing his metoprolol  succinate to 50 mg BID.  We'll have him f/u with us  in a week to reassess his a-fib. [HD]  1438 T4,Free(Direct): 0.91 Not elevated [HD]  1438 Troponin T High Sensitivity(!): 36 Likely secondary to A-fib [HD]    Clinical Course User Index [HD] Nicholaus Rolland BRAVO, MD   At time of discharge there is no evidence of acute life, limb, vision, or fertility threat. Patient has stable vital signs, pain is well controlled and p.o. tolerant.  Discharge instructions were completed using the EPIC system. I would refer you to those at this time. All warnings prescriptions follow-up etc. were discussed in detail with the patient. Patient indicates understanding and is agreeable with this plan. All questions answered.  Patient is made aware that they may return to the emergency department for any worsening or new condition or for any other emergency.   -- Risk: 5 This patient has a high risk of morbidity due to further diagnostic testing or treatment. Rationale: This patients evaluation and management involve a high risk of morbidity due to the potential severity of presenting symptoms, need for diagnostic testing, and/or initiation of treatment that may require close monitoring. The differential includes conditions with potential for significant deterioration or requiring escalation of care. Treatment decisions in the ED, including medication administration, procedural interventions, or disposition planning, reflect this  level of risk. COPA: 5 The patient has the following acute or chronic illness/injury that poses a possible threat to life or bodily function: [X] : The patient has a potentially serious acute condition or an acute exacerbation of a chronic illness requiring urgent evaluation and management in the Emergency Department. The clinical presentation necessitates immediate consideration of life-threatening or function-threatening diagnoses, even if they are ultimately ruled out.   FINAL CLINICAL IMPRESSION(S) / ED DIAGNOSES   Final diagnoses:  Palpitations  Atrial fibrillation with rapid ventricular response (HCC)     Rx / DC Orders   ED Discharge Orders          Ordered    amiodarone  (PACERONE ) 200 MG tablet  Daily        09/05/24 1441    metoprolol  succinate (TOPROL -XL) 50 MG 24 hr tablet  2 times daily        09/05/24 1441             Note:  This document was prepared using Dragon voice recognition software and may include unintentional dictation errors.   Nicholaus Rolland BRAVO, MD 09/05/24 1540  "

## 2024-09-05 NOTE — ED Notes (Signed)
 Report called to [facility]

## 2024-09-05 NOTE — ED Notes (Signed)
 [facility] unable to transport pt's wheelchair. Wheelchair left at therapist, art. Nurse jasmine from [facility] made aware.

## 2024-09-11 ENCOUNTER — Encounter: Payer: Self-pay | Admitting: Nurse Practitioner

## 2024-09-11 ENCOUNTER — Telehealth: Payer: Self-pay | Admitting: Internal Medicine

## 2024-09-11 ENCOUNTER — Ambulatory Visit: Attending: Nurse Practitioner | Admitting: Nurse Practitioner

## 2024-09-11 VITALS — BP 120/64 | HR 117 | Ht 74.0 in | Wt 242.4 lb

## 2024-09-11 DIAGNOSIS — I428 Other cardiomyopathies: Secondary | ICD-10-CM | POA: Diagnosis not present

## 2024-09-11 DIAGNOSIS — F0390 Unspecified dementia without behavioral disturbance: Secondary | ICD-10-CM | POA: Diagnosis not present

## 2024-09-11 DIAGNOSIS — G4733 Obstructive sleep apnea (adult) (pediatric): Secondary | ICD-10-CM | POA: Diagnosis not present

## 2024-09-11 DIAGNOSIS — I4819 Other persistent atrial fibrillation: Secondary | ICD-10-CM | POA: Diagnosis not present

## 2024-09-11 DIAGNOSIS — N184 Chronic kidney disease, stage 4 (severe): Secondary | ICD-10-CM | POA: Diagnosis not present

## 2024-09-11 DIAGNOSIS — I5022 Chronic systolic (congestive) heart failure: Secondary | ICD-10-CM

## 2024-09-11 NOTE — Telephone Encounter (Signed)
 Pt daughter returning nurses call. Please advise

## 2024-09-11 NOTE — Patient Instructions (Addendum)
 Medication Instructions:  Your physician recommends that you continue on your current medications as directed. Please refer to the Current Medication list given to you today.   *If you need a refill on your cardiac medications before your next appointment, please call your pharmacy*  Lab Work: No labs ordered today  If you have labs (blood work) drawn today and your tests are completely normal, you will receive your results only by: MyChart Message (if you have MyChart) OR A paper copy in the mail If you have any lab test that is abnormal or we need to change your treatment, we will call you to review the results.  Testing/Procedures:     Dear Matthew [name]  You are scheduled for a Cardioversion on Thursday, February 5 with Dr. Gollan.  Please arrive at the [facility] Entrance of [facility], [address], [place] [address] at 7:00 AM (This is 1 hour(s) prior to your procedure time).  Proceed to the Check-In Desk directly inside the entrance.  Procedure Parking: Use the entrance off of the [address] side of the hospital. Turn right upon entering and follow the driveway to parking that is directly in front of the [facility]. There is no valet parking available at this entrance, however there is an awning directly in front of the [facility] for drop off/ pick up for patients.   DIET:  Nothing to eat or drink after midnight except a sip of water with medications (see medication instructions below)  MEDICATION INSTRUCTIONS: !!IF ANY NEW MEDICATIONS ARE STARTED AFTER TODAY, PLEASE NOTIFY YOUR PROVIDER AS SOON AS POSSIBLE!!  FYI: Medications such as Semaglutide (Ozempic, Wegovy), Tirzepatide (Mounjaro, Zepbound), Dulaglutide (Trulicity), etc (GLP1 agonists) AND Canagliflozin (Invokana), Dapagliflozin  (Farxiga ), Empagliflozin (Jardiance), Ertugliflozin (Steglatro), Bexagliflozin [employer]) or any combination with one of these drugs such as Invokamet  (Canagliflozin/Metformin), Synjardy (Empagliflozin/Metformin), etc (SGLT2 inhibitors) must be held around the time of a procedure. This is not a comprehensive list of all of these drugs. Please review all of your medications and talk to your provider if you take any one of these. If you are not sure, ask your provider.          HOLD: Dapagliflozin  (Farxiga ) for 3 days prior to the procedure. Last dose on Sunday, February 01.   Continue Eliquis  5 MG twice daily. If you miss any doses please contact the office. You will need to continue this after your procedure until you are told by your provider that it is safe to stop.       FYI:  For your safety, and to allow us  to monitor your vital signs accurately during the surgery/procedure we request: If you have artificial nails, gel coating, SNS etc, please have those removed prior to your surgery/procedure. Not having the nail coverings /polish removed may result in cancellation or delay of your surgery/procedure.  You must have a responsible person to drive you home and stay in the waiting area during your procedure. Failure to do so could result in cancellation.  Bring your insurance cards.  *Special Note: Every effort is made to have your procedure done on time. Occasionally there are emergencies that occur at the hospital that may cause delays. Please be patient if a delay does occur.      Follow-Up: At [facility], you and your health needs are our priority.  As part of our continuing mission to provide you with exceptional heart care, our providers are  all part of one team.  This team includes your primary Cardiologist (physician) and Advanced Practice Providers or APPs (Physician Assistants and Nurse Practitioners) who all work together to provide you with the care you need, when you need it.  Your next appointment:   3 week(s)  Provider:   You may see Lonni Hanson, MD or one of the following Advanced Practice Providers on  your designated Care Team:   Lonni Meager, NP Lesley Maffucci, PA-C Bernardino Bring, PA-C Cadence Milo, PA-C Tylene Lunch, NP Barnie Hila, NP    We recommend signing up for the patient portal called MyChart.  Sign up information is provided on this After Visit Summary.  MyChart is used to connect with patients for Virtual Visits (Telemedicine).  Patients are able to view lab/test results, encounter notes, upcoming appointments, etc.  Non-urgent messages can be sent to your provider as well.   To learn more about what you can do with MyChart, go to [email or web address].

## 2024-09-11 NOTE — Telephone Encounter (Signed)
 Returned call to pt daughter, advised that pt arrived at [facility] with no paperwork/medlist, and pt was confused as to where he lived and how to get back home, staff was able to get some information from [facility], still awaiting medication list Patient has cardioversion scheduled for 2/5 at 8am  Daughter is aware
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
## 2024-09-17 NOTE — Anesthesia Preprocedure Evaluation (Signed)
 "                                  Anesthesia Evaluation  Patient identified by MRN, date of birth, ID band Patient awake    Reviewed: Allergy & Precautions, H&P , NPO status , Patient's Chart, lab work & pertinent test results  Airway Mallampati: III       Dental  (+) Missing, Poor Dentition, Chipped Missing all but about 8 teeth, very poor dentition :   Pulmonary shortness of breath, asthma , sleep apnea , COPD   Pulmonary exam normal        Cardiovascular hypertension, + CAD and +CHF  + dysrhythmias Atrial Fibrillation + Valvular Problems/Murmurs MR  Rhythm:Irregular Rate:Normal - Systolic murmurs and - Peripheral Edema 05-27-24 1. Left ventricular ejection fraction, by estimation, is 35 to 40%. The  left ventricle has moderately decreased function. The left ventricle  demonstrates global hypokinesis. Left ventricular diastolic parameters are  indeterminate.   2. Right ventricular systolic function is normal. The right ventricular  size is normal. There is normal pulmonary artery systolic pressure. The  estimated right ventricular systolic pressure is 31.0 mmHg.   3. The mitral valve is normal in structure. Mild mitral valve  regurgitation. No evidence of mitral stenosis.   4. The aortic valve is normal in structure. Aortic valve regurgitation is  not visualized. No aortic stenosis is present.   5. The inferior vena cava is normal in size with greater than 50%  respiratory variability, suggesting right atrial pressure of 3 mmHg.     07-09-24  Per Dr. Mady Patient did not report any unstable symptoms at our visit on 07/02/2024. However, he has significant functional limitation (NYHA class III heart failure) with borderline low blood pressure. In general, cataract surgery is low risk from a cardiac standpoint, though given the patient's comorbidities, he is at elevated risk for perioperative cardiovascular complications. Nonetheless, I do not think that  additional cardiac testing/intervention with further mitigate the patient's perioperative cardiac risk. He can proceed at the discretion of Dr. Jaye. He should continue apixaban  throughout the perioperative period.        Neuro/Psych Seizures -,  PSYCHIATRIC DISORDERS Anxiety Depression Bipolar Disorder  Dementia  Neuromuscular disease CVA, Residual Symptoms    GI/Hepatic negative GI ROS, Neg liver ROS,,,  Endo/Other  Hypothyroidism    Renal/GU Renal disease  negative genitourinary   Musculoskeletal  (+) Arthritis ,    Abdominal  (+) + obese  Peds negative pediatric ROS (+)  Hematology  (+) Blood dyscrasia, anemia   Anesthesia Other Findings Hip dislocation, bilateral (HCC) Asthma Edema CAD (coronary artery disease) CHF (congestive heart failure) (HCC) Anxiety Arrhythmia  Anemia Gout  Depression Osteoporosis COPD (chronic obstructive pulmonary disease)  Sleep apnea  History of kidney stones Neuromuscular disorder (HCC) Dysrhythmia Hypothyroidism  Arthritis Substance abuse (HCC)  History of anemia due to chronic kidney disease Chronic pain  Bipolar disorder  Chronic kidney disease, stage 4 (severe) Chronic systolic (congestive) heart failure  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)  Epilepsy, unspecified, not intractable, without status epilepticus (HCC) Global hypokinesis of left ventricle Cardiac LV ejection fraction 30-35% Mild mitral regurgitation by prior echocardiogram  Alcohol  abuse, in remission Atrial fibrillation     Reproductive/Obstetrics negative OB ROS  Anesthesia Physical Anesthesia Plan  ASA: 4  Anesthesia Plan: General   Post-op Pain Management:    Induction: Intravenous  PONV Risk Score and Plan:   Airway Management Planned: Natural Airway and Nasal Cannula  Additional Equipment:   Intra-op Plan:   Post-operative Plan:    Informed Consent: I have reviewed the patients History and Physical, chart, labs and discussed the procedure including the risks, benefits and alternatives for the proposed anesthesia with the patient or authorized representative who has indicated his/her understanding and acceptance.     Dental Advisory Given  Plan Discussed with: Anesthesiologist, CRNA and Surgeon  Anesthesia Plan Comments:          Anesthesia Quick Evaluation  "

## 2024-09-18 ENCOUNTER — Encounter
Admission: RE | Disposition: A | Discharge status: Skilled Nursing Facility | Payer: Self-pay | Source: Home / Self Care | Attending: Cardiovascular Disease

## 2024-09-18 ENCOUNTER — Encounter: Payer: Self-pay | Admitting: Anesthesiology

## 2024-09-18 ENCOUNTER — Ambulatory Visit
Admission: RE | Admit: 2024-09-18 | Discharge: 2024-09-18 | Disposition: A | Discharge status: Skilled Nursing Facility | Source: Home / Self Care | Attending: Cardiovascular Disease | Admitting: Cardiovascular Disease

## 2024-09-18 ENCOUNTER — Other Ambulatory Visit: Payer: Self-pay

## 2024-09-18 ENCOUNTER — Encounter: Payer: Self-pay | Admitting: Cardiovascular Disease

## 2024-09-18 DIAGNOSIS — I4819 Other persistent atrial fibrillation: Secondary | ICD-10-CM

## 2024-09-18 DIAGNOSIS — R0602 Shortness of breath: Secondary | ICD-10-CM | POA: Diagnosis present

## 2024-09-18 MED ORDER — PROPOFOL 10 MG/ML IV BOLUS
INTRAVENOUS | Status: DC | PRN
  Administered 2024-09-18: 60 mg via INTRAVENOUS
  Administered 2024-09-18: 20 mg via INTRAVENOUS

## 2024-09-18 MED ORDER — SODIUM CHLORIDE 0.9 % IV SOLN: INTRAVENOUS | Status: DC

## 2024-09-18 NOTE — CV Procedure (Signed)
 Cardioversion procedure note For atrial fibrillation, persistent.  Procedure Details:  Consent: Risks of procedure as well as the alternatives and risks of each were explained to the (patient/caregiver).  Consent for procedure obtained.  Time Out: Verified patient identification, verified procedure, site/side was marked, verified correct patient position, special equipment/implants available, medications/allergies/relevent history reviewed, required imaging and test results available.  Performed  Patient placed on cardiac monitor, pulse oximetry, supplemental oxygen  as necessary.   Sedation given: propofol  IV, Dr. Vicci Pacer pads placed anterior and posterior chest.   Cardioverted 3 time(s).   Cardioverted at 150 J, 200J Ant-Post pads Cardioverted 200 J Ant-lateral pads Synchronized biphasic Converted to NSR   Evaluation: Findings: Post procedure EKG shows: NSR Complications: None Patient did tolerate procedure well.  Time Spent Directly with the Patient:  45 minutes   Tim Nikash Mortensen, M.D., Ph.D.

## 2024-09-18 NOTE — Transfer of Care (Signed)
 Immediate Anesthesia Transfer of Care Note  Patient: Matthew [name]  Procedure(s) Performed: CARDIOVERSION  Patient Location: spu  Anesthesia Type:General  Level of Consciousness: drowsy  Airway & Oxygen  Therapy: Patient Spontanous Breathing and Patient connected to nasal cannula oxygen   Post-op Assessment: Report given to RN and Post -op Vital signs reviewed and stable  Post vital signs: Reviewed  Last Vitals:  Vitals Value Taken Time  BP 110/66 09/18/24 08:13  Temp    Pulse 45 09/18/24 08:14  Resp 22 09/18/24 08:14  SpO2 98 % 09/18/24 08:14    Last Pain:  Vitals:   09/18/24 0749  TempSrc: Temporal  PainSc: 0-No pain         Complications: No notable events documented.

## 2024-09-18 NOTE — Anesthesia Postprocedure Evaluation (Signed)
"   Anesthesia Post Note  Patient: Matthew [name]  Procedure(s) Performed: CARDIOVERSION  Patient location during evaluation: PACU Anesthesia Type: General Level of consciousness: awake and alert Pain management: pain level controlled Vital Signs Assessment: post-procedure vital signs reviewed and stable Respiratory status: spontaneous breathing, nonlabored ventilation and respiratory function stable Cardiovascular status: blood pressure returned to baseline and stable Postop Assessment: no apparent nausea or vomiting Anesthetic complications: no   No notable events documented.   Last Vitals:  Vitals:   09/18/24 0845 09/18/24 0900  BP: 98/75 114/70  Pulse: (!) 55 64  Resp: (!) 25 17  Temp:    SpO2: 94% 92%    Last Pain:  Vitals:   09/18/24 0900  TempSrc:   PainSc: 0-No pain                 [name]      "

## 2024-09-19 ENCOUNTER — Encounter: Payer: Self-pay | Admitting: Cardiovascular Disease

## 2024-09-19 ENCOUNTER — Other Ambulatory Visit: Payer: Self-pay

## 2024-09-19 NOTE — Interval H&P Note (Signed)
 History and Physical Interval Note:  09/19/2024 6:24 PM  Matthew [name]  has presented today for surgery, with the diagnosis of Cardioversion   Afib OK 2nd case Megan Anesthesia.  The various methods of treatment have been discussed with the patient and family. After consideration of risks, benefits and other options for treatment, the patient has consented to  Procedures: CARDIOVERSION (N/A) as a surgical intervention.  The patient's history has been reviewed, patient examined, no change in status, stable for surgery.  I have reviewed the patient's chart and labs.  Questions were answered to the patient's satisfaction.     Matthew [name]

## 2024-09-19 NOTE — H&P (Signed)

## 2024-10-02 ENCOUNTER — Ambulatory Visit: Admitting: Nurse Practitioner

## 2024-10-08 ENCOUNTER — Ambulatory Visit: Admitting: Internal Medicine

## 2024-10-15 ENCOUNTER — Ambulatory Visit: Admitting: Family
